# Patient Record
Sex: Female | Born: 1935
Health system: Southern US, Community
[De-identification: ages and names within clinical notes are randomized; demographics above are authoritative.]

## PROBLEM LIST (undated history)

## (undated) DIAGNOSIS — H269 Unspecified cataract: Secondary | ICD-10-CM

## (undated) DIAGNOSIS — I455 Other specified heart block: Secondary | ICD-10-CM

## (undated) DIAGNOSIS — E785 Hyperlipidemia, unspecified: Secondary | ICD-10-CM

## (undated) DIAGNOSIS — Z5189 Encounter for other specified aftercare: Secondary | ICD-10-CM

## (undated) DIAGNOSIS — K589 Irritable bowel syndrome without diarrhea: Secondary | ICD-10-CM

## (undated) DIAGNOSIS — K635 Polyp of colon: Secondary | ICD-10-CM

## (undated) DIAGNOSIS — K222 Esophageal obstruction: Secondary | ICD-10-CM

## (undated) DIAGNOSIS — K219 Gastro-esophageal reflux disease without esophagitis: Secondary | ICD-10-CM

## (undated) DIAGNOSIS — E039 Hypothyroidism, unspecified: Secondary | ICD-10-CM

## (undated) DIAGNOSIS — I480 Paroxysmal atrial fibrillation: Secondary | ICD-10-CM

## (undated) DIAGNOSIS — F419 Anxiety disorder, unspecified: Secondary | ICD-10-CM

## (undated) DIAGNOSIS — K529 Noninfective gastroenteritis and colitis, unspecified: Secondary | ICD-10-CM

## (undated) DIAGNOSIS — R55 Syncope and collapse: Secondary | ICD-10-CM

## (undated) DIAGNOSIS — I739 Peripheral vascular disease, unspecified: Secondary | ICD-10-CM

## (undated) DIAGNOSIS — I119 Hypertensive heart disease without heart failure: Secondary | ICD-10-CM

## (undated) DIAGNOSIS — D649 Anemia, unspecified: Secondary | ICD-10-CM

## (undated) DIAGNOSIS — I519 Heart disease, unspecified: Secondary | ICD-10-CM

## (undated) DIAGNOSIS — N184 Chronic kidney disease, stage 4 (severe): Secondary | ICD-10-CM

## (undated) DIAGNOSIS — M1990 Unspecified osteoarthritis, unspecified site: Secondary | ICD-10-CM

## (undated) DIAGNOSIS — Z950 Presence of cardiac pacemaker: Secondary | ICD-10-CM

## (undated) DIAGNOSIS — I447 Left bundle-branch block, unspecified: Secondary | ICD-10-CM

## (undated) DIAGNOSIS — R011 Cardiac murmur, unspecified: Secondary | ICD-10-CM

## (undated) DIAGNOSIS — K5790 Diverticulosis of intestine, part unspecified, without perforation or abscess without bleeding: Secondary | ICD-10-CM

## (undated) DIAGNOSIS — H8110 Benign paroxysmal vertigo, unspecified ear: Secondary | ICD-10-CM

## (undated) DIAGNOSIS — T7840XA Allergy, unspecified, initial encounter: Secondary | ICD-10-CM

## (undated) HISTORY — DX: Gastro-esophageal reflux disease without esophagitis: K21.9

## (undated) HISTORY — DX: Presence of cardiac pacemaker: Z95.0

## (undated) HISTORY — PX: TRANSTHORACIC ECHOCARDIOGRAM: SHX275

## (undated) HISTORY — DX: Hypothyroidism, unspecified: E03.9

## (undated) HISTORY — DX: Unspecified osteoarthritis, unspecified site: M19.90

## (undated) HISTORY — DX: Anemia, unspecified: D64.9

## (undated) HISTORY — DX: Cardiac murmur, unspecified: R01.1

## (undated) HISTORY — PX: OTHER SURGICAL HISTORY: SHX169

## (undated) HISTORY — DX: Irritable bowel syndrome, unspecified: K58.9

## (undated) HISTORY — DX: Unspecified cataract: H26.9

## (undated) HISTORY — DX: Allergy, unspecified, initial encounter: T78.40XA

## (undated) HISTORY — PX: CATARACT EXTRACTION: SUR2

## (undated) HISTORY — DX: Diverticulosis of intestine, part unspecified, without perforation or abscess without bleeding: K57.90

## (undated) HISTORY — DX: Polyp of colon: K63.5

## (undated) HISTORY — DX: Anxiety disorder, unspecified: F41.9

## (undated) HISTORY — DX: Hyperlipidemia, unspecified: E78.5

## (undated) HISTORY — DX: Chronic kidney disease, stage 4 (severe): N18.4

## (undated) HISTORY — DX: Encounter for other specified aftercare: Z51.89

## (undated) HISTORY — PX: APPENDECTOMY: SHX54

## (undated) HISTORY — DX: Esophageal obstruction: K22.2

## (undated) HISTORY — DX: Heart disease, unspecified: I51.9

---

## 1960-11-11 HISTORY — PX: OOPHORECTOMY: SHX86

## 1998-02-20 ENCOUNTER — Ambulatory Visit (HOSPITAL_COMMUNITY): Admission: RE | Admit: 1998-02-20 | Discharge: 1998-02-20 | Payer: Self-pay | Admitting: Hematology and Oncology

## 1998-11-11 HISTORY — PX: LUMBAR DISC SURGERY: SHX700

## 1999-06-11 ENCOUNTER — Encounter: Payer: Self-pay | Admitting: Orthopedic Surgery

## 1999-06-12 ENCOUNTER — Encounter (INDEPENDENT_AMBULATORY_CARE_PROVIDER_SITE_OTHER): Payer: Self-pay | Admitting: Specialist

## 1999-06-12 ENCOUNTER — Inpatient Hospital Stay (HOSPITAL_COMMUNITY): Admission: RE | Admit: 1999-06-12 | Discharge: 1999-06-14 | Payer: Self-pay | Admitting: Orthopedic Surgery

## 1999-06-12 ENCOUNTER — Encounter: Payer: Self-pay | Admitting: Orthopedic Surgery

## 2001-04-14 ENCOUNTER — Ambulatory Visit (HOSPITAL_COMMUNITY): Admission: RE | Admit: 2001-04-14 | Discharge: 2001-04-14 | Payer: Self-pay | Admitting: Family Medicine

## 2001-04-14 ENCOUNTER — Encounter: Payer: Self-pay | Admitting: Family Medicine

## 2001-07-09 ENCOUNTER — Encounter (INDEPENDENT_AMBULATORY_CARE_PROVIDER_SITE_OTHER): Payer: Self-pay | Admitting: Gastroenterology

## 2001-07-10 ENCOUNTER — Encounter (INDEPENDENT_AMBULATORY_CARE_PROVIDER_SITE_OTHER): Payer: Self-pay

## 2001-07-10 ENCOUNTER — Other Ambulatory Visit: Admission: RE | Admit: 2001-07-10 | Discharge: 2001-07-10 | Payer: Self-pay | Admitting: Gastroenterology

## 2003-09-08 ENCOUNTER — Encounter (INDEPENDENT_AMBULATORY_CARE_PROVIDER_SITE_OTHER): Payer: Self-pay | Admitting: Gastroenterology

## 2004-10-10 ENCOUNTER — Ambulatory Visit: Payer: Self-pay | Admitting: Cardiology

## 2004-10-15 ENCOUNTER — Ambulatory Visit: Payer: Self-pay

## 2004-11-19 ENCOUNTER — Ambulatory Visit: Payer: Self-pay | Admitting: Hematology & Oncology

## 2005-03-20 ENCOUNTER — Ambulatory Visit: Payer: Self-pay | Admitting: Hematology & Oncology

## 2005-03-22 ENCOUNTER — Ambulatory Visit: Payer: Self-pay | Admitting: Family Medicine

## 2005-06-11 ENCOUNTER — Ambulatory Visit: Payer: Self-pay | Admitting: Cardiology

## 2005-08-01 ENCOUNTER — Ambulatory Visit: Payer: Self-pay | Admitting: Family Medicine

## 2005-09-17 ENCOUNTER — Ambulatory Visit: Payer: Self-pay | Admitting: Hematology & Oncology

## 2005-09-19 ENCOUNTER — Ambulatory Visit: Payer: Self-pay | Admitting: Family Medicine

## 2005-11-20 ENCOUNTER — Ambulatory Visit: Payer: Self-pay | Admitting: Family Medicine

## 2006-01-21 ENCOUNTER — Ambulatory Visit: Payer: Self-pay | Admitting: Family Medicine

## 2006-02-03 ENCOUNTER — Ambulatory Visit: Payer: Self-pay | Admitting: Gastroenterology

## 2006-02-18 ENCOUNTER — Ambulatory Visit: Payer: Self-pay | Admitting: Gastroenterology

## 2006-02-18 ENCOUNTER — Encounter (INDEPENDENT_AMBULATORY_CARE_PROVIDER_SITE_OTHER): Payer: Self-pay | Admitting: *Deleted

## 2006-03-16 ENCOUNTER — Ambulatory Visit: Payer: Self-pay | Admitting: Hematology & Oncology

## 2006-03-19 LAB — CBC WITH DIFFERENTIAL/PLATELET
BASO%: 0.7 % (ref 0.0–2.0)
EOS%: 1.4 % (ref 0.0–7.0)
HCT: 33.5 % — ABNORMAL LOW (ref 34.8–46.6)
LYMPH%: 27.1 % (ref 14.0–48.0)
MCH: 31.3 pg (ref 26.0–34.0)
MCHC: 34 g/dL (ref 32.0–36.0)
MONO#: 0.5 10*3/uL (ref 0.1–0.9)
MONO%: 11 % (ref 0.0–13.0)
NEUT%: 59.8 % (ref 39.6–76.8)
Platelets: 215 10*3/uL (ref 145–400)
RBC: 3.64 10*6/uL — ABNORMAL LOW (ref 3.70–5.32)
WBC: 4.3 10*3/uL (ref 3.9–10.0)

## 2006-03-19 LAB — CHCC SMEAR

## 2006-08-11 ENCOUNTER — Ambulatory Visit: Payer: Self-pay | Admitting: Gastroenterology

## 2006-11-14 ENCOUNTER — Ambulatory Visit: Payer: Self-pay | Admitting: Hematology & Oncology

## 2006-12-23 ENCOUNTER — Encounter: Admission: RE | Admit: 2006-12-23 | Discharge: 2006-12-23 | Payer: Self-pay | Admitting: Orthopedic Surgery

## 2007-01-12 ENCOUNTER — Ambulatory Visit: Payer: Self-pay | Admitting: Family Medicine

## 2007-01-12 LAB — CONVERTED CEMR LAB
ALT: 14 units/L (ref 0–40)
AST: 20 units/L (ref 0–37)
Albumin: 3.7 g/dL (ref 3.5–5.2)
Alkaline Phosphatase: 135 units/L — ABNORMAL HIGH (ref 39–117)
BUN: 34 mg/dL — ABNORMAL HIGH (ref 6–23)
Basophils Absolute: 0 10*3/uL (ref 0.0–0.1)
Basophils Relative: 0.9 % (ref 0.0–1.0)
Bilirubin, Direct: 0.1 mg/dL (ref 0.0–0.3)
CO2: 29 meq/L (ref 19–32)
Calcium: 9 mg/dL (ref 8.4–10.5)
Chloride: 105 meq/L (ref 96–112)
Cholesterol: 222 mg/dL (ref 0–200)
Creatinine, Ser: 1.4 mg/dL — ABNORMAL HIGH (ref 0.4–1.2)
Direct LDL: 155.2 mg/dL
Eosinophils Absolute: 0.1 10*3/uL (ref 0.0–0.6)
Eosinophils Relative: 2.7 % (ref 0.0–5.0)
GFR calc Af Amer: 47 mL/min
GFR calc non Af Amer: 38 mL/min
Glucose, Bld: 102 mg/dL — ABNORMAL HIGH (ref 70–99)
HCT: 36.7 % (ref 36.0–46.0)
HDL: 51.2 mg/dL (ref >39.0)
Hemoglobin: 12.3 g/dL (ref 12.0–15.0)
Lymphocytes Relative: 22.4 % (ref 12.0–46.0)
MCHC: 33.6 g/dL (ref 30.0–36.0)
MCV: 91.9 fL (ref 78.0–100.0)
Monocytes Absolute: 0.5 10*3/uL (ref 0.2–0.7)
Monocytes Relative: 9.6 % (ref 3.0–11.0)
Neutro Abs: 3.3 10*3/uL (ref 1.4–7.7)
Neutrophils Relative %: 64.4 % (ref 43.0–77.0)
Platelets: 243 10*3/uL (ref 150–400)
Potassium: 4.3 meq/L (ref 3.5–5.1)
RBC: 3.99 M/uL (ref 3.87–5.11)
RDW: 12.4 % (ref 11.5–14.6)
Sodium: 141 meq/L (ref 135–145)
TSH: 1.39 microintl units/mL (ref 0.35–5.50)
Total Bilirubin: 0.7 mg/dL (ref 0.3–1.2)
Total CHOL/HDL Ratio: 4.3
Total Protein: 7 g/dL (ref 6.0–8.3)
Triglycerides: 80 mg/dL (ref 0–149)
VLDL: 16 mg/dL (ref 0–40)
WBC: 5 10*3/uL (ref 4.5–10.5)

## 2007-03-04 ENCOUNTER — Ambulatory Visit: Payer: Self-pay | Admitting: Gastroenterology

## 2007-03-10 ENCOUNTER — Ambulatory Visit: Payer: Self-pay | Admitting: Gastroenterology

## 2007-04-01 ENCOUNTER — Encounter: Payer: Self-pay | Admitting: Family Medicine

## 2007-05-05 ENCOUNTER — Ambulatory Visit: Payer: Self-pay | Admitting: Cardiology

## 2007-05-12 HISTORY — PX: LOW ANTERIOR BOWEL RESECTION: SUR1240

## 2007-05-13 ENCOUNTER — Ambulatory Visit: Payer: Self-pay | Admitting: Cardiology

## 2007-05-19 ENCOUNTER — Encounter (INDEPENDENT_AMBULATORY_CARE_PROVIDER_SITE_OTHER): Payer: Self-pay | Admitting: General Surgery

## 2007-05-19 ENCOUNTER — Encounter (INDEPENDENT_AMBULATORY_CARE_PROVIDER_SITE_OTHER): Payer: Self-pay | Admitting: *Deleted

## 2007-05-19 ENCOUNTER — Inpatient Hospital Stay (HOSPITAL_COMMUNITY): Admission: RE | Admit: 2007-05-19 | Discharge: 2007-05-25 | Payer: Self-pay | Admitting: General Surgery

## 2007-07-23 DIAGNOSIS — I119 Hypertensive heart disease without heart failure: Secondary | ICD-10-CM | POA: Insufficient documentation

## 2007-07-23 DIAGNOSIS — E039 Hypothyroidism, unspecified: Secondary | ICD-10-CM | POA: Insufficient documentation

## 2007-07-23 DIAGNOSIS — E785 Hyperlipidemia, unspecified: Secondary | ICD-10-CM | POA: Insufficient documentation

## 2007-07-27 ENCOUNTER — Telehealth: Payer: Self-pay | Admitting: Family Medicine

## 2007-12-03 ENCOUNTER — Ambulatory Visit: Payer: Self-pay | Admitting: Gastroenterology

## 2008-01-13 ENCOUNTER — Ambulatory Visit: Payer: Self-pay | Admitting: Family Medicine

## 2008-01-13 DIAGNOSIS — M1990 Unspecified osteoarthritis, unspecified site: Secondary | ICD-10-CM | POA: Insufficient documentation

## 2008-01-13 DIAGNOSIS — Z860100 Personal history of colon polyps, unspecified: Secondary | ICD-10-CM | POA: Insufficient documentation

## 2008-01-13 DIAGNOSIS — D649 Anemia, unspecified: Secondary | ICD-10-CM | POA: Insufficient documentation

## 2008-01-13 DIAGNOSIS — J45909 Unspecified asthma, uncomplicated: Secondary | ICD-10-CM | POA: Insufficient documentation

## 2008-01-15 LAB — CONVERTED CEMR LAB
Basophils Absolute: 0 10*3/uL (ref 0.0–0.1)
Bilirubin, Direct: 0.1 mg/dL (ref 0.0–0.3)
Eosinophils Absolute: 0.1 10*3/uL (ref 0.0–0.6)
Eosinophils Relative: 1.3 % (ref 0.0–5.0)
GFR calc Af Amer: 37 mL/min
GFR calc non Af Amer: 31 mL/min
Glucose, Bld: 94 mg/dL (ref 70–99)
HCT: 34.4 % — ABNORMAL LOW (ref 36.0–46.0)
Lymphocytes Relative: 28.2 % (ref 12.0–46.0)
MCHC: 33.3 g/dL (ref 30.0–36.0)
MCV: 91.7 fL (ref 78.0–100.0)
Neutro Abs: 2.3 10*3/uL (ref 1.4–7.7)
Neutrophils Relative %: 56.2 % (ref 43.0–77.0)
Potassium: 5.8 meq/L — ABNORMAL HIGH (ref 3.5–5.1)
Sodium: 142 meq/L (ref 135–145)
TSH: 0.91 microintl units/mL (ref 0.35–5.50)
WBC: 4.2 10*3/uL — ABNORMAL LOW (ref 4.5–10.5)

## 2008-02-01 ENCOUNTER — Encounter: Payer: Self-pay | Admitting: Family Medicine

## 2008-02-22 ENCOUNTER — Ambulatory Visit: Payer: Self-pay | Admitting: Internal Medicine

## 2008-02-24 ENCOUNTER — Ambulatory Visit: Payer: Self-pay | Admitting: Cardiology

## 2008-02-25 ENCOUNTER — Telehealth: Payer: Self-pay | Admitting: Family Medicine

## 2008-03-11 ENCOUNTER — Encounter: Payer: Self-pay | Admitting: Family Medicine

## 2008-03-17 ENCOUNTER — Encounter: Payer: Self-pay | Admitting: Family Medicine

## 2008-03-28 ENCOUNTER — Ambulatory Visit: Payer: Self-pay | Admitting: Cardiology

## 2008-04-07 ENCOUNTER — Encounter: Payer: Self-pay | Admitting: Family Medicine

## 2008-04-07 ENCOUNTER — Ambulatory Visit: Payer: Self-pay

## 2008-05-17 ENCOUNTER — Ambulatory Visit: Payer: Self-pay | Admitting: Family Medicine

## 2008-05-22 ENCOUNTER — Emergency Department (HOSPITAL_COMMUNITY): Admission: EM | Admit: 2008-05-22 | Discharge: 2008-05-23 | Payer: Self-pay | Admitting: Emergency Medicine

## 2008-06-28 ENCOUNTER — Ambulatory Visit: Payer: Self-pay | Admitting: Cardiology

## 2008-07-27 ENCOUNTER — Telehealth: Payer: Self-pay | Admitting: Family Medicine

## 2008-08-02 ENCOUNTER — Telehealth: Payer: Self-pay | Admitting: Family Medicine

## 2008-09-01 ENCOUNTER — Ambulatory Visit: Payer: Self-pay | Admitting: Family Medicine

## 2008-09-19 ENCOUNTER — Telehealth: Payer: Self-pay | Admitting: Family Medicine

## 2008-09-26 ENCOUNTER — Ambulatory Visit: Payer: Self-pay | Admitting: Family Medicine

## 2008-09-28 LAB — CONVERTED CEMR LAB
Albumin: 3.8 g/dL (ref 3.5–5.2)
BUN: 34 mg/dL — ABNORMAL HIGH (ref 6–23)
Basophils Relative: 1.3 % (ref 0.0–3.0)
Calcium: 9.1 mg/dL (ref 8.4–10.5)
Creatinine, Ser: 1.3 mg/dL — ABNORMAL HIGH (ref 0.4–1.2)
Eosinophils Absolute: 0.1 10*3/uL (ref 0.0–0.7)
Eosinophils Relative: 1.6 % (ref 0.0–5.0)
GFR calc Af Amer: 50 mL/min
GFR calc non Af Amer: 42 mL/min
HCT: 30.2 % — ABNORMAL LOW (ref 36.0–46.0)
HDL: 44.4 mg/dL (ref >39.0)
MCV: 93.5 fL (ref 78.0–100.0)
Monocytes Absolute: 0.4 10*3/uL (ref 0.1–1.0)
Platelets: 177 10*3/uL (ref 150–400)
TSH: 2.44 microintl units/mL (ref 0.35–5.50)
WBC: 4 10*3/uL — ABNORMAL LOW (ref 4.5–10.5)

## 2008-10-03 ENCOUNTER — Telehealth: Payer: Self-pay | Admitting: Family Medicine

## 2008-12-07 ENCOUNTER — Telehealth: Payer: Self-pay | Admitting: Family Medicine

## 2008-12-26 ENCOUNTER — Ambulatory Visit: Payer: Self-pay | Admitting: Cardiology

## 2008-12-28 ENCOUNTER — Encounter (INDEPENDENT_AMBULATORY_CARE_PROVIDER_SITE_OTHER): Payer: Self-pay | Admitting: *Deleted

## 2008-12-29 ENCOUNTER — Ambulatory Visit: Payer: Self-pay | Admitting: Family Medicine

## 2008-12-29 ENCOUNTER — Encounter: Payer: Self-pay | Admitting: Cardiology

## 2008-12-29 DIAGNOSIS — F411 Generalized anxiety disorder: Secondary | ICD-10-CM | POA: Insufficient documentation

## 2009-01-27 ENCOUNTER — Telehealth: Payer: Self-pay | Admitting: Family Medicine

## 2009-02-01 ENCOUNTER — Ambulatory Visit: Payer: Self-pay | Admitting: Gastroenterology

## 2009-02-06 ENCOUNTER — Telehealth: Payer: Self-pay | Admitting: Gastroenterology

## 2009-02-09 ENCOUNTER — Telehealth: Payer: Self-pay | Admitting: Family Medicine

## 2009-02-16 ENCOUNTER — Encounter: Payer: Self-pay | Admitting: Cardiology

## 2009-02-16 ENCOUNTER — Ambulatory Visit: Payer: Self-pay | Admitting: Cardiology

## 2009-02-20 ENCOUNTER — Encounter: Payer: Self-pay | Admitting: Gastroenterology

## 2009-02-20 ENCOUNTER — Telehealth: Payer: Self-pay | Admitting: Cardiology

## 2009-02-20 ENCOUNTER — Ambulatory Visit: Payer: Self-pay | Admitting: Gastroenterology

## 2009-02-21 ENCOUNTER — Encounter: Payer: Self-pay | Admitting: Gastroenterology

## 2009-02-28 ENCOUNTER — Ambulatory Visit: Payer: Self-pay

## 2009-03-06 ENCOUNTER — Ambulatory Visit: Payer: Self-pay | Admitting: Family Medicine

## 2009-03-06 DIAGNOSIS — R609 Edema, unspecified: Secondary | ICD-10-CM | POA: Insufficient documentation

## 2009-03-22 ENCOUNTER — Ambulatory Visit: Payer: Self-pay | Admitting: Gastroenterology

## 2009-03-22 DIAGNOSIS — K589 Irritable bowel syndrome without diarrhea: Secondary | ICD-10-CM | POA: Insufficient documentation

## 2009-04-05 ENCOUNTER — Telehealth: Payer: Self-pay | Admitting: Family Medicine

## 2009-04-17 ENCOUNTER — Telehealth: Payer: Self-pay | Admitting: Cardiology

## 2009-04-28 ENCOUNTER — Encounter (INDEPENDENT_AMBULATORY_CARE_PROVIDER_SITE_OTHER): Payer: Self-pay | Admitting: *Deleted

## 2009-05-16 ENCOUNTER — Encounter: Payer: Self-pay | Admitting: Family Medicine

## 2009-05-22 ENCOUNTER — Ambulatory Visit: Payer: Self-pay | Admitting: Cardiology

## 2009-05-30 ENCOUNTER — Telehealth: Payer: Self-pay | Admitting: Cardiology

## 2009-05-31 ENCOUNTER — Ambulatory Visit: Payer: Self-pay | Admitting: Cardiology

## 2009-06-02 LAB — CONVERTED CEMR LAB
CO2: 28 meq/L (ref 19–32)
GFR calc non Af Amer: 41.61 mL/min (ref >60)
Glucose, Bld: 90 mg/dL (ref 70–99)
Potassium: 4.5 meq/L (ref 3.5–5.1)
Sodium: 142 meq/L (ref 135–145)

## 2009-06-16 ENCOUNTER — Telehealth: Payer: Self-pay | Admitting: Cardiology

## 2009-07-03 ENCOUNTER — Ambulatory Visit: Payer: Self-pay | Admitting: Cardiology

## 2009-07-04 ENCOUNTER — Telehealth (INDEPENDENT_AMBULATORY_CARE_PROVIDER_SITE_OTHER): Payer: Self-pay | Admitting: *Deleted

## 2009-07-05 LAB — CONVERTED CEMR LAB
BUN: 29 mg/dL — ABNORMAL HIGH (ref 6–23)
CO2: 27 meq/L (ref 19–32)
Chloride: 110 meq/L (ref 96–112)
Creatinine, Ser: 1.5 mg/dL — ABNORMAL HIGH (ref 0.4–1.2)
Potassium: 4.2 meq/L (ref 3.5–5.1)

## 2009-07-13 ENCOUNTER — Ambulatory Visit: Payer: Self-pay | Admitting: Cardiology

## 2009-08-01 ENCOUNTER — Telehealth: Payer: Self-pay | Admitting: Family Medicine

## 2009-08-30 ENCOUNTER — Ambulatory Visit: Payer: Self-pay | Admitting: Family Medicine

## 2009-10-17 ENCOUNTER — Ambulatory Visit: Payer: Self-pay | Admitting: Cardiology

## 2009-10-17 DIAGNOSIS — R0989 Other specified symptoms and signs involving the circulatory and respiratory systems: Secondary | ICD-10-CM | POA: Insufficient documentation

## 2009-10-18 ENCOUNTER — Encounter: Payer: Self-pay | Admitting: Cardiology

## 2009-10-18 ENCOUNTER — Ambulatory Visit: Payer: Self-pay

## 2009-10-24 ENCOUNTER — Telehealth: Payer: Self-pay | Admitting: Cardiology

## 2009-10-25 ENCOUNTER — Ambulatory Visit: Payer: Self-pay | Admitting: Cardiology

## 2009-11-01 ENCOUNTER — Ambulatory Visit: Payer: Self-pay | Admitting: Cardiology

## 2009-11-01 ENCOUNTER — Telehealth: Payer: Self-pay | Admitting: Cardiology

## 2009-11-01 LAB — CONVERTED CEMR LAB
BUN: 33 mg/dL — ABNORMAL HIGH (ref 6–23)
CO2: 24 meq/L (ref 19–32)
GFR calc non Af Amer: 41.56 mL/min (ref >60)
Glucose, Bld: 93 mg/dL (ref 70–99)
Potassium: 5.3 meq/L — ABNORMAL HIGH (ref 3.5–5.1)

## 2009-11-02 LAB — CONVERTED CEMR LAB
BUN: 40 mg/dL — ABNORMAL HIGH (ref 6–23)
CO2: 23 meq/L (ref 19–32)
Calcium: 8.9 mg/dL (ref 8.4–10.5)
Creatinine, Ser: 1.7 mg/dL — ABNORMAL HIGH (ref 0.4–1.2)

## 2009-11-14 ENCOUNTER — Ambulatory Visit: Payer: Self-pay | Admitting: Cardiology

## 2009-11-17 LAB — CONVERTED CEMR LAB
BUN: 38 mg/dL — ABNORMAL HIGH (ref 6–23)
Creatinine, Ser: 1.6 mg/dL — ABNORMAL HIGH (ref 0.4–1.2)
Potassium: 5 meq/L (ref 3.5–5.1)

## 2009-12-04 ENCOUNTER — Ambulatory Visit: Payer: Self-pay | Admitting: Cardiology

## 2009-12-18 ENCOUNTER — Telehealth: Payer: Self-pay | Admitting: Cardiology

## 2009-12-18 ENCOUNTER — Ambulatory Visit: Payer: Self-pay | Admitting: Cardiovascular Disease

## 2009-12-19 ENCOUNTER — Telehealth: Payer: Self-pay | Admitting: Cardiology

## 2009-12-26 ENCOUNTER — Telehealth: Payer: Self-pay | Admitting: Cardiology

## 2009-12-28 ENCOUNTER — Telehealth (INDEPENDENT_AMBULATORY_CARE_PROVIDER_SITE_OTHER): Payer: Self-pay | Admitting: *Deleted

## 2009-12-28 ENCOUNTER — Ambulatory Visit: Payer: Self-pay | Admitting: Cardiovascular Disease

## 2009-12-29 ENCOUNTER — Telehealth: Payer: Self-pay | Admitting: Family Medicine

## 2010-01-02 LAB — CONVERTED CEMR LAB
BUN: 42 mg/dL — ABNORMAL HIGH (ref 6–23)
Chloride: 110 meq/L (ref 96–112)
Glucose, Bld: 79 mg/dL (ref 70–99)
Potassium: 5.5 meq/L — ABNORMAL HIGH (ref 3.5–5.1)

## 2010-01-09 ENCOUNTER — Ambulatory Visit: Payer: Self-pay | Admitting: Cardiology

## 2010-01-10 ENCOUNTER — Telehealth: Payer: Self-pay | Admitting: Gastroenterology

## 2010-01-16 ENCOUNTER — Ambulatory Visit: Payer: Self-pay | Admitting: Family Medicine

## 2010-01-18 ENCOUNTER — Telehealth: Payer: Self-pay | Admitting: Cardiology

## 2010-01-18 LAB — CONVERTED CEMR LAB
ALT: 16 units/L (ref 0–35)
AST: 26 units/L (ref 0–37)
BUN: 31 mg/dL — ABNORMAL HIGH (ref 6–23)
Eosinophils Relative: 2.4 % (ref 0.0–5.0)
GFR calc non Af Amer: 38.14 mL/min (ref >60)
HCT: 32.3 % — ABNORMAL LOW (ref 36.0–46.0)
Hemoglobin: 10.6 g/dL — ABNORMAL LOW (ref 12.0–15.0)
LDL Cholesterol: 120 mg/dL — ABNORMAL HIGH (ref 0–99)
Lymphs Abs: 1 10*3/uL (ref 0.7–4.0)
Monocytes Relative: 10.2 % (ref 3.0–12.0)
Platelets: 164 10*3/uL (ref 150.0–400.0)
Potassium: 5.3 meq/L — ABNORMAL HIGH (ref 3.5–5.1)
RBC: 3.39 M/uL — ABNORMAL LOW (ref 3.87–5.11)
Sodium: 143 meq/L (ref 135–145)
TSH: 0.77 microintl units/mL (ref 0.35–5.50)
Total Bilirubin: 0.4 mg/dL (ref 0.3–1.2)
Total CHOL/HDL Ratio: 3
VLDL: 16 mg/dL (ref 0.0–40.0)
WBC: 3 10*3/uL — ABNORMAL LOW (ref 4.5–10.5)

## 2010-01-29 ENCOUNTER — Ambulatory Visit: Payer: Self-pay | Admitting: Cardiology

## 2010-02-01 ENCOUNTER — Telehealth: Payer: Self-pay | Admitting: Family Medicine

## 2010-02-07 ENCOUNTER — Ambulatory Visit: Payer: Self-pay | Admitting: Cardiology

## 2010-02-07 ENCOUNTER — Encounter (INDEPENDENT_AMBULATORY_CARE_PROVIDER_SITE_OTHER): Payer: Self-pay | Admitting: *Deleted

## 2010-02-12 LAB — CONVERTED CEMR LAB: Metaneph Total, Ur: 501 ug/24hr (ref 224–832)

## 2010-02-28 ENCOUNTER — Encounter: Payer: Self-pay | Admitting: Cardiology

## 2010-03-01 ENCOUNTER — Ambulatory Visit: Payer: Self-pay

## 2010-03-01 ENCOUNTER — Encounter: Payer: Self-pay | Admitting: Cardiology

## 2010-03-08 ENCOUNTER — Ambulatory Visit: Payer: Self-pay | Admitting: Cardiology

## 2010-03-14 ENCOUNTER — Telehealth: Payer: Self-pay | Admitting: Family Medicine

## 2010-03-30 ENCOUNTER — Encounter (INDEPENDENT_AMBULATORY_CARE_PROVIDER_SITE_OTHER): Payer: Self-pay | Admitting: *Deleted

## 2010-04-03 ENCOUNTER — Telehealth (INDEPENDENT_AMBULATORY_CARE_PROVIDER_SITE_OTHER): Payer: Self-pay | Admitting: *Deleted

## 2010-04-23 ENCOUNTER — Encounter: Payer: Self-pay | Admitting: Family Medicine

## 2010-05-28 ENCOUNTER — Encounter: Payer: Self-pay | Admitting: Family Medicine

## 2010-05-29 ENCOUNTER — Encounter: Payer: Self-pay | Admitting: Family Medicine

## 2010-07-02 ENCOUNTER — Ambulatory Visit: Payer: Self-pay | Admitting: Gastroenterology

## 2010-07-04 ENCOUNTER — Ambulatory Visit: Payer: Self-pay | Admitting: Family Medicine

## 2010-07-04 DIAGNOSIS — H8110 Benign paroxysmal vertigo, unspecified ear: Secondary | ICD-10-CM | POA: Insufficient documentation

## 2010-08-13 ENCOUNTER — Encounter: Payer: Self-pay | Admitting: Family Medicine

## 2010-09-06 ENCOUNTER — Ambulatory Visit: Payer: Self-pay | Admitting: Family Medicine

## 2010-10-26 ENCOUNTER — Encounter: Payer: Self-pay | Admitting: Family Medicine

## 2010-12-09 LAB — CONVERTED CEMR LAB
BUN: 27 mg/dL — ABNORMAL HIGH (ref 6–23)
Bilirubin Urine: NEGATIVE
Chloride: 109 meq/L (ref 96–112)
GFR calc Af Amer: 51 mL/min
GFR calc non Af Amer: 42 mL/min
Glucose, Urine, Semiquant: NEGATIVE
Nitrite: NEGATIVE
Potassium: 4.4 meq/L (ref 3.5–5.1)
Potassium: 4.5 meq/L (ref 3.5–5.1)
Sodium: 140 meq/L (ref 135–145)
Specific Gravity, Urine: 1.02
pH: 5

## 2010-12-11 NOTE — Miscellaneous (Signed)
Summary: Orders Update  Clinical Lists Changes  Orders: Added new Test order of T-Urine 24hr. [facility]MA/[facility]VA ([id number]) - Signed Added new Test order of T-Urine 24 Hr. Metanephrines [phone]) - Signed

## 2010-12-11 NOTE — Letter (Signed)
Summary: Office Visit Letter  [facility]  [address] [place], [place] [address]   Phone: [phone]  Fax: [phone]      Mar 30, 2010 MRN: [id number]   [place], [place]  [address]   Dear Ms. Reade,   According to our records, it is time for you to schedule a follow-up office visit with [place].   At your convenience, please call [phone] (option #2)to schedule an office visit. If you have any questions, concerns, or feel that this letter is in error, we would appreciate your call.   Sincerely,  Norberto Sorenson T. Fuller Plan, M.D.  [facility] [phone]

## 2010-12-11 NOTE — Assessment & Plan Note (Signed)
Summary: BP Issues   Visit Type:  Follow-up Primary Provider:  [name], MD  CC:  BP issues- High Blood Pressure.  History of Present Illness: 77 year-old woman with malignant HTN, pt of Dr Percival Spanish, presenting for evaluation of worsening HTN. At recent visit her clonidine was increased to 0.2 mg two times a day but she has not tolerated this well because of dry mouth and fatigue. Home BP's have been around A999333 systolic. She has had an intermittent dull headache, but denies chest pain, dyspnea, edema, or other complaints. No blurry vision.  Current Medications (verified): 1)  Aspirin Ec Lo-Dose 81 Mg Tbec (Aspirin) .... Take 1 Tablet By Mouth Every Morning 2)  Lisinopril-Hydrochlorothiazide 20-12.5 Mg Tabs (Lisinopril-Hydrochlorothiazide) .... 2 Po   By Mouth Once Daily 3)  Synthroid 88 Mcg Tabs (Levothyroxine Sodium) .Marland Kitchen.. 1 By Mouth Once Daily 4)  Temazepam 30 Mg  Caps (Temazepam) .... At Bedtime As Needed 5)  Diltiazem Hcl Coated Beads 120 Mg Xr24h-Cap (Diltiazem Hcl Coated Beads) .... One By Mouth Once Daily 6)  Folic Acid 1 Mg Tabs (Folic Acid) .... Daily 7)  Gas-X 80 Mg Chew (Simethicone) .... Daily 8)  Align  Caps (Probiotic Product) .... As Needed 9)  Clonidine Hcl 0.2 Mg Tabs (Clonidine Hcl) .... One Twice A Day  Allergies: 1)  ! Penicillin V Potassium (Penicillin V Potassium) 2)  Amoxicillin (Amoxicillin) 3)  Codeine Phosphate (Codeine Phosphate)  Past History:  Past medical history reviewed for relevance to current acute and chronic problems.  Past Medical History: Reviewed history from 02/15/2009 and no changes required. Hyperlipidemia Hypertension, sees Dr. Percival Spanish Hypothyroidism Osteoarthritis Anemia-NOS Asthma Renal insufficiency, sees Dr. [name] Anxiety Irritable Bowel Syndrome Gout Adenomatous Colon Polyps 06/2001 Tubulovillous Adenoma 02/2006 Lupus Hemorrhoids Obesity Diverticulosis Carotid Bruits (non obstructive plaque)  Review of Systems    Negative except as per HPI   Vital Signs:  Patient profile:   77 year old female Menstrual status:  postmenopausal Pulse rate:   64 / minute Resp:     16 per minute BP sitting:   220 / 76  (left arm) Cuff size:   large  Vitals Entered By: Sidney Ace (December 18, 2009 1:48 PM)  Physical Exam  General:  Pt is alert and oriented, elderly woman, in no acute distress. HEENT: normal Neck: normal carotid upstrokes without bruits, JVP normal Lungs: CTA CV: RRR without murmur, S4 gallop present Abd: soft, NT, positive BS, no bruit, no organomegaly Ext: no clubbing, cyanosis, or edema. peripheral pulses 2+ and equal Skin: warm and dry without rash    Impression & Recommendations:  Problem # 1:  HYPERTENSION (ICD-401.9) Recommend decrease clonidine to 0.1 mg two times a day as she tolerates this at lower doses. I reviewed her past history of antihypertensive meds and Dr Hochrein's previous office notes. She has tolerated aldactone at the 25 mg dose (had hyperkalemia at 50 mg). Will give her another trial of aldactone 25 mg and also start hydralazine 25 mg three times a day. Continue bp log and f/u with Dr Percival Spanish as scheduled - also check a BMET at the time of her return visit.  Her updated medication list for this problem includes:    Aspirin Ec Lo-dose 81 Mg Tbec (Aspirin) .Marland Kitchen... Take 1 tablet by mouth every morning    Lisinopril-hydrochlorothiazide 20-12.5 Mg Tabs (Lisinopril-hydrochlorothiazide) .Marland Kitchen... 2 po   by mouth once daily    Diltiazem Hcl Coated Beads 120 Mg Xr24h-cap (Diltiazem hcl coated beads) ..... One  by mouth once daily    Clonidine Hcl 0.1 Mg Tabs (Clonidine hcl) .Marland Kitchen... Take one tablet by mouth twice a day    Aldactone 25 Mg Tabs (Spironolactone) .Marland Kitchen... Take one tablet by mouth daily    Hydralazine Hcl 25 Mg Tabs (Hydralazine hcl) .Marland Kitchen... Take one tablet by mouth three times a day  Patient Instructions: 1)  Your physician has recommended you make the following change  in your medication: DECREASE Clonidine to 0.1mg  one tablet by mouth twice a day, START Aldactone 25mg  one tablet daily, START Hydralazine 25mg  take one tablet by mouth three times a day 2)  Your physician recommends that you return for lab work in: 10 DAYS (BMP 402.01, v58.69) 3)  Please keep your scheduled follow-up appointment with Dr Percival Spanish.  Prescriptions: HYDRALAZINE HCL 25 MG TABS (HYDRALAZINE HCL) Take one tablet by mouth three times a day  #90 x 6   Entered by:   Theodosia Quay, RN, BSN   Authorized by:   Neale Burly, MD   Signed by:   Theodosia Quay, RN, BSN on 12/18/2009   Method used:   Electronically to        [facility] (retail)       [address]       [place], [place]  [address]       Ph: [phone] or [phone]       Fax: [phone]   RxIDWE:[id number] ALDACTONE 25 MG TABS (SPIRONOLACTONE) take one tablet by mouth daily  #30 x 6   Entered by:   Theodosia Quay, RN, BSN   Authorized by:   Neale Burly, MD   Signed by:   Theodosia Quay, RN, BSN on 12/18/2009   Method used:   Electronically to        [facility] (retail)       [address]       [place], [place]   [address]       Ph: [phone] or [phone]       Fax: [phone]   RxIDPI:[id number] CLONIDINE HCL 0.1 MG TABS (CLONIDINE HCL) take one tablet by mouth twice a day  #60 x 6   Entered by:   Theodosia Quay, RN, BSN   Authorized by:   Neale Burly, MD   Signed by:   Theodosia Quay, RN, BSN on 12/18/2009   Method used:   Electronically to        [facility] (retail)       [address]       [place], [place]  [address]       Ph: [phone] or [phone]       Fax: [phone]   RxID:   [id number]

## 2010-12-11 NOTE — Progress Notes (Signed)
Summary: high blood pressure  Phone Note Call from Patient Call back at [facility] Phone [phone] Call back at [phone] (cell)   Caller: Patient Call For: Dr. Fuller Plan Reason for Call: Talk to Nurse Summary of Call: pt reporting very high blood pressure... pt says she watched something on TV last night that said that her high blood pressure could be caused by bacteria in her GI tract Initial call taken by: Lucien Mons,  January 10, 2010 10:08 AM  Follow-up for Phone Call        Advised patient that I was unaware of any studies with HTN and gut bacteria.  She saw a infomertial about the correlation.  I have asked her to call her cardiologist and maybe he would be more familular or could consider a link if he thought it possible.   Follow-up by: Barb Merino RN, CGRN,  January 10, 2010 10:20 AM

## 2010-12-11 NOTE — Progress Notes (Signed)
Summary: refill temazepam  Phone Note From Pharmacy   Caller: [facility] Call For: Sarajane Jews  Summary of Call: refill temazepam 30mg  1 by mouth at bedtime Initial call taken by: Townsend Roger, [place],  December 29, 2009 4:52 PM  Follow-up for Phone Call        call in #30 with 5 rf Follow-up by: [name] MD,  December 29, 2009 5:18 PM  Additional Follow-up for Phone Call Additional follow up Details #1::        Phone call completed, Pharmacist called Additional Follow-up by: Townsend Roger, [place],  January 01, 2010 8:27 AM    Prescriptions: TEMAZEPAM 30 MG  CAPS (TEMAZEPAM) at bedtime as needed  #30 x 5   Entered by:   Townsend Roger, [place] by:   [name] MD   Signed by:   Townsend Roger, CMA on 01/01/2010   Method used:   Telephoned to ...       [facility] [address] (retail)       [place], [place]  [address]       Ph: [phone]       Fax: [phone]   RxID:   [id number]

## 2010-12-11 NOTE — Letter (Signed)
Summary: [facility]  [facility]   Imported By: Laural Benes 06/04/2010 12:23:50  _____________________________________________________________________  External Attachment:    Type:   Image     Comment:   External Document

## 2010-12-11 NOTE — Progress Notes (Signed)
Summary: med question  Phone Note Call from Patient   Caller: Patient Call For: [name] MD Summary of Call: Pt. bought Certizine, and wants to know if it is ok to take this with her other meds. [id number] Initial call taken by: Deanna Artis CMA,  Mar 14, 2010 9:57 AM  Follow-up for Phone Call        yes this is okay Follow-up by: [name] MD,  Mar 14, 2010 4:46 PM  Additional Follow-up for Phone Call Additional follow up Details #1::        Phone Call Completed Additional Follow-up by: Shelbie Hutching, RN,  Mar 14, 2010 5:04 PM

## 2010-12-11 NOTE — Letter (Signed)
Summary: [facility]  [facility]   Imported By: Laural Benes 05/02/2010 14:00:55  _____________________________________________________________________  External Attachment:    Type:   Image     Comment:   External Document

## 2010-12-11 NOTE — Progress Notes (Signed)
Summary: med questions  Phone Note Call from Patient Call back at [phone]   Caller: Son/[name] Reason for Call: Talk to Nurse Summary of Call: req call back... has some questions about her meds Initial call taken by: Darnell Level,  December 19, 2009 9:01 AM  Follow-up for Phone Call        The pt son called stating he had spoken with the pt this morning and that she was upset that she was taking so many bp meds. The pt's son called to verify that the pt should be on 5 different bp meds. He knew that the pt's bp had been high in the office. I verified with [name] that the pt should be on these medicaitons per Dr. Antionette Char note. He states that the pt is just upset about her elevated bp. I questioned if the pt has a bp monitor at home. He verified that she did and that he felt like she was taking it very frequently. I explained to [name] that the pt should not be taking her bp frequently. We advise 30 min to 1 hour after morning meds and then once in the evening. I explained she can take it an additional time during the day if she has a HA/dizziness/or another symptom. [name] verbalized understanding.  Follow-up by: Alvis Lemmings, RN, BSN,  December 19, 2009 9:28 AM

## 2010-12-11 NOTE — Assessment & Plan Note (Signed)
Summary: 6WK OK PER PAM   Visit Type:  Follow-up Primary Provider:  [name], MD  CC:  HTN.  History of Present Illness: The patient presents for followup of her difficult to control hypertension. I had increased Aldactone at the last visit. However, she became hyperkalemic with this. Followup demonstrated her potassium to be back down. She does have a mildly elevated creatinine. She has since been started on clonidine. This does cause dry mouth but she is otherwise tolerating it. However, she brings a blood pressure diary today which I am reviewing. Her heart rate is in the 40s to 60s. Unfortunately her blood pressure is still averaging [id number] A999333 systolic though below A999333 for the most part diastolic.  She denies chest discomfort, neck or arm discomfort. She has no shortness of breath, PND or orthopnea.  Current Medications (verified): 1)  Aspirin Ec Lo-Dose 81 Mg Tbec (Aspirin) .... Take 1 Tablet By Mouth Every Morning 2)  Lisinopril-Hydrochlorothiazide 20-12.5 Mg Tabs (Lisinopril-Hydrochlorothiazide) .... 2 Po   By Mouth Once Daily 3)  Synthroid 88 Mcg Tabs (Levothyroxine Sodium) .Marland Kitchen.. 1 By Mouth Once Daily 4)  Temazepam 30 Mg  Caps (Temazepam) .... At Bedtime As Needed 5)  Diltiazem Hcl Coated Beads 120 Mg Xr24h-Cap (Diltiazem Hcl Coated Beads) .... One By Mouth Once Daily 6)  Folic Acid 1 Mg Tabs (Folic Acid) .... Daily 7)  Gas-X 80 Mg Chew (Simethicone) .... Daily 8)  Align  Caps (Probiotic Product) .... As Needed 9)  Clonidine Hcl 0.1 Mg Tabs (Clonidine Hcl) .... One Twice Daily  Allergies (verified): 1)  ! Penicillin V Potassium (Penicillin V Potassium) 2)  Amoxicillin (Amoxicillin) 3)  Codeine Phosphate (Codeine Phosphate)  Past History:  Past Medical History: Reviewed history from 02/15/2009 and no changes required. Hyperlipidemia Hypertension, sees Dr. Percival Spanish Hypothyroidism Osteoarthritis Anemia-NOS Asthma Renal insufficiency, sees Dr. [name] Anxiety Irritable  Bowel Syndrome Gout Adenomatous Colon Polyps 06/2001 Tubulovillous Adenoma 02/2006 Lupus Hemorrhoids Obesity Diverticulosis Carotid Bruits (non obstructive plaque)  Past Surgical History: Reviewed history from 07/13/2009 and no changes required. Lumbar disk surgery 2000 Squamous cell skin cancers removed, per Dr. Sarajane Jews Oophorectomy Rt Appendectomy Low anterior resection Dr. Zella Richer 7/ 2008 Cataract Extraction Colonoscopy 02-20-09 per Dr. Fuller Plan, repeat in 3 yrs  Review of Systems       As stated in the HPI and negative for all other systems.   Vital Signs:  Patient profile:   77 year old female Menstrual status:  postmenopausal Height:      64 inches Weight:      165 pounds BMI:     28.42 Pulse rate:   47 / minute Resp:     16 per minute BP sitting:   154 / 57  (right arm)  Vitals Entered By: Levora Angel, CNA (December 04, 2009 12:11 PM)  Physical Exam  General:  Well developed, well nourished, in no acute distress. Head:  normocephalic and atraumatic Eyes:  anisocoria, conjunctiva and lids normal. Mouth:  Teeth, gums and palate normal. Oral mucosa normal. Neck:  Neck supple, no JVD. No masses, thyromegaly or abnormal cervical nodes. Chest Wall:  no deformities or breast masses noted Lungs:  Clear bilaterally to auscultation and percussion. Abdomen:  Bowel sounds positive; abdomen soft and non-tender without masses, organomegaly, or hernias noted. No hepatosplenomegaly. Msk:  Back normal, normal gait. Muscle strength and tone normal. Extremities:  No clubbing or cyanosis. Neurologic:  Alert and oriented x 3. Skin:  Intact without lesions or rashes. Cervical Nodes:  no  significant adenopathy Axillary Nodes:  no significant adenopathy Inguinal Nodes:  no significant adenopathy Psych:  Normal affect.   Detailed Cardiovascular Exam  Neck    Carotids: Carotids full and equal bilaterally without bruits.      Neck Veins: Normal, no JVD.    Heart     Inspection: no deformities or lifts noted.      Palpation: normal PMI with no thrills palpable.      Auscultation: regular rate and rhythm, S1, S2 without murmurs, rubs, gallops, or clicks.    Vascular    Abdominal Aorta: no palpable masses, pulsations, positive soft audible bruit    Femoral Pulses: normal femoral pulses bilaterally.      Pedal Pulses: pulses normal in all 4 extremities    Radial Pulses: normal radial pulses bilaterally.      Peripheral Circulation: no clubbing, cyanosis, or edema noted with normal capillary refill.     Impression & Recommendations:  Problem # 1:  ESSENTIAL HYPERTENSION, BENIGN (ICD-401.1) Her blood pressure is still not controlled. I will increase her clonidine to 0.2 mg b.i.d. Next step will most likely be the addition of hydralazine.  Problem # 2:  DEPENDENT EDEMA (ICD-782.3) She he is no longer bothered by this. No further evaluation is necessary.  Problem # 3:  RENAL INSUFFICIENCY (ICD-588.9) This has been checked recently. If it is not drawn in the next month between now and the next time I see her I will draw again at that visit.  Patient Instructions: 1)  Your physician recommends that you schedule a follow-up appointment in: 1 month with Dr Percival Spanish 2)  Your physician has recommended you make the following change in your medication: Increase Clonidine to 0.2 mg twice a day Prescriptions: CLONIDINE HCL 0.2 MG TABS (CLONIDINE HCL) one twice a day  #60 x 11   Entered by:   Sim Boast, RN   Authorized by:   Minus Breeding, MD, [facility]   Signed by:   Sim Boast, RN on 12/04/2009   Method used:   Electronically to        [facility] (retail)       [address]       [place], [place]  [address]       Ph: [phone] or [phone]       Fax: [phone]   RxID:   [id number]

## 2010-12-11 NOTE — Assessment & Plan Note (Signed)
Summary: 1 month rov/sl   Visit Type:  Follow-up Primary Provider:  [name], MD  CC:  Htn.  History of Present Illness: The patient presents for follow up of her very difficult to control HTN.  She had a recent visit with Dr. Burt Knack for her BP.  He  add hydralazine and increase her clonidine. Unfortunately she didn't tolerate either of these. She felt very fatigued lethargic and to experience anhedonia. She stopped the hydralazine and get the clonidine at that 0.1 mg b.i.d. dose. Her blood pressures continued to be poorly controlled. She is here with her daughter-in-law and her both frustrated that we have not found a regimen that she tolerates and that works. They don't understand why she needs to be on more than one drug. She is not having chest pressure, neck or arm discomfort. She does have some strong heart rates and hears her heart beating in her ears sometimes particularly at night and she walks or tries to sleep. She is not describing a rapid heart rate and doesn't describe presyncope or syncope. She has not had any flushing or diaphoresis. She's had no new shortness of breath.  Current Medications (verified): 1)  Aspirin Ec Lo-Dose 81 Mg Tbec (Aspirin) .... Take 1 Tablet By Mouth Every Morning 2)  Lisinopril-Hydrochlorothiazide 20-12.5 Mg Tabs (Lisinopril-Hydrochlorothiazide) .... 2 Po   By Mouth Once Daily 3)  Synthroid 88 Mcg Tabs (Levothyroxine Sodium) .Marland Kitchen.. 1 By Mouth Once Daily 4)  Temazepam 30 Mg  Caps (Temazepam) .... At Bedtime As Needed 5)  Diltiazem Hcl Coated Beads 120 Mg Xr24h-Cap (Diltiazem Hcl Coated Beads) .... One By Mouth Once Daily 6)  Folic Acid 1 Mg Tabs (Folic Acid) .... Daily 7)  Gas-X 80 Mg Chew (Simethicone) .... Daily 8)  Align  Caps (Probiotic Product) .... As Needed 9)  Clonidine Hcl 0.1 Mg Tabs (Clonidine Hcl) .... Take One Tablet By Mouth Twice A Day  Past History:  Past Medical History: Reviewed history from 02/15/2009 and no changes  required. Hyperlipidemia Hypertension, sees Dr. Percival Spanish Hypothyroidism Osteoarthritis Anemia-NOS Asthma Renal insufficiency, sees Dr. [name] Anxiety Irritable Bowel Syndrome Gout Adenomatous Colon Polyps 06/2001 Tubulovillous Adenoma 02/2006 Lupus Hemorrhoids Obesity Diverticulosis Carotid Bruits (non obstructive plaque)  Past Surgical History: Reviewed history from 07/13/2009 and no changes required. Lumbar disk surgery 2000 Squamous cell skin cancers removed, per Dr. Sarajane Jews Oophorectomy Rt Appendectomy Low anterior resection Dr. Zella Richer 7/ 2008 Cataract Extraction Colonoscopy 02-20-09 per Dr. Fuller Plan, repeat in 3 yrs  Review of Systems       As stated in the HPI and negative for all other systems.   Vital Signs:  Patient profile:   77 year old female Menstrual status:  postmenopausal Height:      64 inches Weight:      163 pounds BMI:     28.08 Pulse rate:   52 / minute Resp:     16 per minute BP sitting:   194 / 78  (right arm)  Vitals Entered By: Levora Angel, CNA (January 09, 2010 11:10 AM)  Physical Exam  General:  Well developed, well nourished, in no acute distress. Head:  normocephalic and atraumatic Eyes:  anisocoria, fundi not visualized Mouth:  Teeth, gums and palate normal. Oral mucosa normal. Neck:  Neck supple, no JVD. No masses, thyromegaly or abnormal cervical nodes. Chest Wall:  no deformities or breast masses noted Lungs:  Clear bilaterally to auscultation and percussion. Abdomen:  Bowel sounds positive; abdomen soft and non-tender without masses, organomegaly,  or hernias noted. No hepatosplenomegaly. Msk:  Back normal, normal gait. Muscle strength and tone normal. Extremities:  No clubbing or cyanosis. Neurologic:  Alert and oriented x 3. Skin:  Intact without lesions or rashes. Cervical Nodes:  no significant adenopathy Axillary Nodes:  no significant adenopathy Inguinal Nodes:  no significant adenopathy Psych:  Normal  affect.   Detailed Cardiovascular Exam  Neck    Carotids: Carotids full and equal bilaterally without bruits.      Neck Veins: Normal, no JVD.    Heart    Inspection: no deformities or lifts noted.      Palpation: normal PMI with no thrills palpable.      Auscultation: regular rate and rhythm, S1, S2 without murmurs, rubs, gallops, or clicks.    Vascular    Abdominal Aorta: no palpable masses, pulsations, or audible bruits.      Femoral Pulses: normal femoral pulses bilaterally.      Pedal Pulses: normal pedal pulses bilaterally.      Radial Pulses: normal radial pulses bilaterally.      Peripheral Circulation: no clubbing, cyanosis, or edema noted with normal capillary refill.     Impression & Recommendations:  Problem # 1:  BEN HTN HEART DISEASE WITHOUT HEART FAIL (ICD-402.10) Her blood pressure has been very difficult to control.a long discussion about this (greater than one half hour with this appointment). I considered using Tekturna but we have no samples. I will try Hytrin 1 mg q.h.s. She'll remain on the other 3 meds as listed. I will check with her nephrologist to see if she's had renal ultrasound to look for renal artery stenosis. If not I will order this.  Problem # 2:  CAROTID STENOSIS (ICD-433.10) She has nonobstructive disease. We will follow this up as indicated. She will continue with risk reduction.  Problem # 3:  RENAL INSUFFICIENCY (ICD-588.9) As above I will consult with her nephrologist.  Patient Instructions: 1)  Your physician recommends that you schedule a follow-up appointment in: [place] 2)  Your physician has recommended you make the following change in your medication: START MINIPRES 1 MG AT BEDTIME Prescriptions: MINIPRESS 1 MG CAPS (PRAZOSIN HCL) ONE AT BEDTIME  #30 x 11   Entered by:   Sim Boast, RN   Authorized by:   Minus Breeding, MD, [facility]   Signed by:   Sim Boast, RN on 01/09/2010   Method used:    Electronically to        [facility] (retail)       [address]       [place], [place]  [address]       Ph: [phone] or [phone]       Fax: [phone]   RxID:   [id number]

## 2010-12-11 NOTE — Miscellaneous (Signed)
  Clinical Lists Changes  Observations: Added new observation of ABDOM US: Small caliber abdominal aorta and proximal common iliac arteries, without evidence of focal dialtion. Mild aortic iliac atherosclerosis without stenosis. (10/18/2009 11:46)      [place] of Abdomen  Procedure date:  10/18/2009  Findings:      Small caliber abdominal aorta and proximal common iliac arteries, without evidence of focal dialtion. Mild aortic iliac atherosclerosis without stenosis.

## 2010-12-11 NOTE — Miscellaneous (Signed)
Summary: Orders Update  Clinical Lists Changes  Orders: Added new Test order of Carotid Duplex (Carotid Duplex) - Signed 

## 2010-12-11 NOTE — Progress Notes (Signed)
Summary: Pt [facility]  Phone Note Call from Patient Call back at [facility] Phone [phone] Call back at [phone] cell   Caller: Patient Summary of Call: Pt called and is req refill  of Lorazepam. Dr. Percival Spanish is req this pt go back on anxiety med due to high blood pressure. [facility] [address].        Initial call taken by: Braulio Bosch,  February 01, 2010 3:48 PM  Follow-up for Phone Call        call in Lorazepam 0.5 mg q 6 hours as needed anxiety, #60 with 2 rf Follow-up by: [name] MD,  February 02, 2010 12:31 PM  Additional Follow-up for Phone Call Additional follow up Details #1::        Phone Call Completed Additional Follow-up by: Chipper Oman, RN,  February 02, 2010 1:13 PM    New/Updated Medications: LORAZEPAM 0.5 MG TABS (LORAZEPAM) 1 q6h as needed anxiety Prescriptions: LORAZEPAM 0.5 MG TABS (LORAZEPAM) 1 q6h as needed anxiety  #60 x 2   Entered by:   Chipper Oman, RN   Authorized by:   [name] MD   Signed by:   Chipper Oman, RN on 02/02/2010   Method used:   Historical   RxIDHM:[id number]

## 2010-12-11 NOTE — Progress Notes (Signed)
  Pt came in [place] to [facility] for processing [facility]  Apr 03, 2010 1:13 PM    Appended Document:  I did not send ROi over to [facility], pt came back into office and asked If I could handle myself this was urgent, I faxed the Most Recent records over to [facility] @ fax [phone]

## 2010-12-11 NOTE — Progress Notes (Signed)
Summary: CALLING ABOUT NEW MEDICATION NAD B/P STILL BEING HIGH  Phone Note Call from Patient Call back at Home Phone [phone] Call back at [phone]   Caller: Patient Summary of Call: PT [place] B/P STILL BEING HIGH Initial call taken by: Delsa Sale,  December 26, 2009 11:00 AM  Follow-up for Phone Call        voicemail box not set up and no answer at call back number listed/  Pam Fleming-Hayes, RN  BP STILL RUNNING HIGH 185-190 OVER 80'S, THE LOWEST IT'S BEEN IS 183/75  HR FROM 53 TO 61. WOOSY HEAD FEELING WITH MOVING AROUND.  HAVING H/A OFF AND ON FOR SEVERAL DAYS.  SHE DOESN'T FEEL LIKE ANY OF THE NEW MEDS SHE WAS STARTED ON ARE HELPING HER VERY MUCH BUT THEY DO MAKE HER FEEL BAD.  SHE WOULD LIKE TO KNOW IF THERE ARE ANY OTHER TESTS SHE SHOULD HAVE TO KNOW WHAT IS CAUSING HER HIGH BLOOD PRESSURE.  ? RENAL DOPPLER  ? 24 HR URINE  ETC...  WILL DISCUSS WITH DR [facility] 12/27/2009 AND CALL PT BACK WITH ORDERS   Follow-up by: Sim Boast, RN,  December 26, 2009 5:45 PM  Additional Follow-up for Phone Call Additional follow up Details #1::        Pt will need to see me in the office. Additional Follow-up by: Minus Breeding, MD, [facility],  December 31, 2009 9:20 PM     Appended Document: CALLING ABOUT NEW MEDICATION NAD B/P STILL BEING HIGH I will schedule the next available appt.  Appended Document: CALLING ABOUT NEW MEDICATION NAD B/P STILL BEING HIGH APPT SCHEDULED FOR 01/09/2010 WITH DR [facility]

## 2010-12-11 NOTE — Consult Note (Signed)
Summary: [facility]   Imported By: Laural Benes 02/27/2010 09:26:08  _____________________________________________________________________  External Attachment:    Type:   Image     Comment:   External Document

## 2010-12-11 NOTE — Assessment & Plan Note (Signed)
Summary: ok per pam/per check out/sf   Visit Type:  Follow-up Primary Provider:  [name], MD  CC:  HTN.  History of Present Illness: The patient presents for followup of her difficult to control hypertension. She has been quite frustrated over his inability to control the blood pressure. She hasn't tolerated many medications. She came with her daughter-in-law that last visit and they expressed their concern about the lack of progress. I started her on Minipress at a very low dose. This did not control the blood pressure so I went from 1 mg q.h.s. to t.i.d. over the phone. However, she didn't tolerate this with increased fatigue. Her blood pressures have not been controlled on b.i.d. dosing though they have been slightly better. She brings a list ranging from 212/86-155/70. Heart rates have been in the high 40s and 50s. She's had suggestions from family members as to drug regimens that might work for her. She has reported anxiety and depression over her current situation. She's not having any new chest discomfort, neck or on discomfort. She's not having any new palpitations, presyncope or syncope. She's not having any new PND or orthopnea. She had some left lower extremity edema.  Current Medications (verified): 1)  Aspirin Ec Lo-Dose 81 Mg Tbec (Aspirin) .... Take 1 Tablet By Mouth Every Morning 2)  Lisinopril-Hydrochlorothiazide 20-12.5 Mg Tabs (Lisinopril-Hydrochlorothiazide) .... 2 Po   By Mouth Once Daily 3)  Synthroid 88 Mcg Tabs (Levothyroxine Sodium) .Marland Kitchen.. 1 By Mouth Once Daily 4)  Temazepam 30 Mg  Caps (Temazepam) .... At Bedtime As Needed 5)  Diltiazem Hcl Coated Beads 120 Mg Xr24h-Cap (Diltiazem Hcl Coated Beads) .... One By Mouth Once Daily 6)  Gas-X 80 Mg Chew (Simethicone) .... Daily 7)  Align  Caps (Probiotic Product) .... As Needed 8)  Clonidine Hcl 0.1 Mg Tabs (Clonidine Hcl) .... Take One Tablet By Mouth Twice A Day 9)  Minipress 1 Mg Caps (Prazosin Hcl) .... 2 By Mouth  Daily  Allergies (verified): 1)  ! Penicillin V Potassium (Penicillin V Potassium) 2)  Amoxicillin (Amoxicillin) 3)  Codeine Phosphate (Codeine Phosphate)  Past History:  Past Medical History: Hyperlipidemia Hypertension, sees Dr. Percival Spanish Hypothyroidism Osteoarthritis Anemia-NOS Asthma Renal insufficiency, sees Dr. [name] Renal doppler US 03-17-08 showed cortical thinning but negative for renal artery stenosis Anxiety Irritable Bowel Syndrome Gout Adenomatous Colon Polyps 06/2001 Tubulovillous Adenoma 02/2006 Lupus Hemorrhoids Obesity Diverticulosis Carotid Bruits (non obstructive plaque)  Past Surgical History: Reviewed history from 07/13/2009 and no changes required. Lumbar disk surgery 2000 Squamous cell skin cancers removed, per Dr. Sarajane Jews Oophorectomy Rt Appendectomy Low anterior resection Dr. Zella Richer 7/ 2008 Cataract Extraction Colonoscopy 02-20-09 per Dr. Fuller Plan, repeat in 3 yrs  Review of Systems       As stated in the HPI and negative for all other systems.   Vital Signs:  Patient profile:   77 year old female Menstrual status:  postmenopausal Height:      64 inches Weight:      165 pounds BMI:     28.42 Pulse rate:   50 / minute Resp:     16 per minute BP sitting:   162 / 76  (right arm)  Vitals Entered By: Levora Angel, CNA (January 29, 2010 11:53 AM)  Physical Exam  General:  Well developed, well nourished, in no acute distress. Head:  normocephalic and atraumatic Eyes:  anisocoria, fundi not visualized Mouth:  Teeth, gums and palate normal. Oral mucosa normal. Neck:  No deformities, masses, or tenderness noted.  Chest Wall:  no deformities or breast masses noted Lungs:  Normal respiratory effort, chest expands symmetrically. Lungs are clear to auscultation, no crackles or wheezes. Abdomen:  Bowel sounds positive; abdomen soft and non-tender without masses, organomegaly, or hernias noted. No hepatosplenomegaly. Msk:  Back normal, normal  gait. Muscle strength and tone normal. Extremities:  no edema Neurologic:  Alert and oriented x 3. Skin:  Intact without lesions or rashes. Psych:  Normal affect.   Detailed Cardiovascular Exam  Neck    Carotids: Carotids full and equal bilaterally without bruits.      Neck Veins: Normal, no JVD.    Heart    Inspection: no deformities or lifts noted.      Palpation: normal PMI with no thrills palpable.      Auscultation: regular rate and rhythm, S1, S2 without murmurs, rubs, gallops, or clicks.    Vascular    Abdominal Aorta: no palpable masses, pulsations, or audible bruits.      Femoral Pulses: normal femoral pulses bilaterally.      Pedal Pulses: normal pedal pulses bilaterally.      Radial Pulses: normal radial pulses bilaterally.      Peripheral Circulation: no clubbing, cyanosis, or edema noted with normal capillary refill.     EKG  Procedure date:  01/29/2010  Findings:      sinus bradycardia, rate 50, axis within normal limits, intervals within normal limits, no acute ST-T wave changes.  Impression & Recommendations:  Problem # 1:  BEN HTN HEART DISEASE WITHOUT HEART FAIL (ICD-402.10) I had a long conversation with the patient today. It's clear that she is depressed, anxious and frustrated over her blood pressure and the inability to find a regimen that she tolerates and that works. I think she and her family have lost confidence in me and I have suggested that she might want a second opinion. She does not want to do that at this point. I reviewed the records to make sure she's had a renal Doppler which was done in 2009. We can repeat this. I checked to make sure that she has a 24-hour urine for metanephrines and also an aldosterone level. I have tried to convince her to take the Minipress t.i.d. having her understand that because of her many reactions to drugs I moving very slowly on this and that I don't expect to see blood pressures fall quickly into line. I explained  to her also that suggestion from certain family members on what works for them might not necessarily apply to her. Finally I suggested that we should try to avoid jumping from a regimen to regimen because of lack of progress her symptoms without giving those regimens and individual medications time to work. Orders: EKG w/ Interpretation (93000)  Problem # 2:  DEPENDENT EDEMA (ICD-782.3) This his mild. She will continue with no change in therapy.  Problem # 3:  CAROTID STENOSIS (ICD-433.10) She he is due to have followup of this. Her updated medication list for this problem includes:    Aspirin Ec Lo-dose 81 Mg Tbec (Aspirin) .Marland Kitchen... Take 1 tablet by mouth every morning  Other Orders: T-Aldosterone [id number]) Renal Artery Duplex (Renal Artery Duplex) T-Urine 24 Hr. Metanephrines [phone]) T-Urine 24hr. VMA/HVA ([id number])  Patient Instructions: 1)  Your physician recommends that you schedule a follow-up appointment after renal doppler 2)  Your physician recommends that you have lab work today  Aldosterone level 24 hour urine to VMA and metanephrines. 3)  Your physician has recommended you make  the following change in your medication: Increase Minipress to 1 mg three times a day 4)  Your physician has requested that you have a renal artery duplex. During this test, an ultrasound is used to evaluate blood flow to the kidneys. Allow one hour for this exam. Do not eat after midnight the day before and avoid carbonated beverages. Take your medications as you usually do. Prescriptions: MINIPRESS 1 MG CAPS (PRAZOSIN HCL) one three times a day  #90 x 11   Entered by:   Sim Boast, RN   Authorized by:   Minus Breeding, MD, [facility]   Signed by:   Sim Boast, RN on 01/29/2010   Method used:   Electronically to        [facility] (retail)       [address]       [place], [place]  [address]       Ph: [phone] or [phone]       Fax: [phone]   RxIDKB:[id number]   Appended Document: ok per pam/per check out/sf Called patient and left message on machine of normal results

## 2010-12-11 NOTE — Assessment & Plan Note (Signed)
Summary: flu shot/cjr  Nurse Visit   Allergies: 1)  ! Penicillin V Potassium (Penicillin V Potassium) 2)  Amoxicillin (Amoxicillin) 3)  Codeine Phosphate (Codeine Phosphate)  Review of Systems       Flu Vaccine Consent Questions     Do you have a history of severe allergic reactions to this vaccine? no    Any prior history of allergic reactions to egg and/or gelatin? no    Do you have a sensitivity to the preservative Thimersol? no    Do you have a past history of Guillan-Barre Syndrome? no    Do you currently have an acute febrile illness? no    Have you ever had a severe reaction to latex? no    Vaccine information given and explained to patient? yes    Are you currently pregnant? no    Lot Number:[id number]   Exp Date:05/11/2011   Site Given  Left Deltoid IM    Orders Added: 1)  Flu Vaccine 59yrs + MEDICARE PATIENTS [Q2039] 2)  Administration Flu vaccine - MCR [id number]

## 2010-12-11 NOTE — Miscellaneous (Signed)
  Clinical Lists Changes  Observations: Added new observation of RENAL USOUND: Normal caliber abdominal aorta. Normal bilateral kidney size. Normal renal arteries, bilaterally. Left renal not well visualized. (03/01/2010 15:10) Added new observation of US CAROTID: Mild plaque bilaterally 40-59% ICA stenosis Abnormal thyroid appearance  f/u 1 year (03/01/2010 15:08)      Carotid Doppler  Procedure date:  03/01/2010  Findings:      Mild plaque bilaterally 40-59% ICA stenosis Abnormal thyroid appearance  f/u 1 year  Renal US  Procedure date:  03/01/2010  Findings:      Normal caliber abdominal aorta. Normal bilateral kidney size. Normal renal arteries, bilaterally. Left renal not well visualized.

## 2010-12-11 NOTE — Progress Notes (Signed)
Summary:  B/P HIGH AND HAD CHEST PAINS LAST NIGHT  Phone Note Call from Patient Call back at [facility] Phone [phone]   Caller: Patient Summary of Call: PT B/P IS HIGH 211/88 YESTERDAY 193/85 THIS MORNING FEELS THAT THE NEW MEDIICATION  IS NOT [place] CHEST PAINS LAST NIGHT. Initial call taken by: Delsa Sale,  January 18, 2010 10:18 AM  Follow-up for Phone Call        Pt was here on 01/09/10 and started on Prazosin for HTN at bedtime. She was told to give it 2 weeks. It has been 10 days and her BP has been 193-211/ 85-88. The lowest it has been is 179/82.  She had some CP last night in the center of her chest it did go away, she has not had CP in a very long time. Her back is sore in the center today does not know if that is related. NO CP now. She wants to know what she should do now that this is not working. I will s/w Dr. Percival Spanish this afternoon when he is here and see what he recommends.  Follow-up by: Barnett Abu, RN, BSN,  January 18, 2010 10:28 AM  Additional Follow-up for Phone Call Additional follow up Details #1::        Per Dr. Percival Spanish she can increase her Minipress to three times a day. Pt is aware and will call with any issues.  Additional Follow-up by: Barnett Abu, RN, BSN,  January 18, 2010 5:08 PM    New/Updated Medications: MINIPRESS 1 MG CAPS (PRAZOSIN HCL) three times a day Prescriptions: MINIPRESS 1 MG CAPS (PRAZOSIN HCL) three times a day  #90 x 6   Entered by:   Barnett Abu, RN, BSN   Authorized by:   Minus Breeding, MD, [facility]   Signed by:   Barnett Abu, RN, BSN on 01/18/2010   Method used:   Electronically to        [facility] (retail)       [address]       [place], [place]  [address]       Ph: [phone] or [phone]       Fax: [phone]   RxID:   [id number]

## 2010-12-11 NOTE — Letter (Signed)
Summary: [facility]   Imported By: Laural Benes 08/22/2010 13:21:09  _____________________________________________________________________  External Attachment:    Type:   Image     Comment:   External Document

## 2010-12-11 NOTE — Assessment & Plan Note (Signed)
Summary: f/u--ch.   History of Present Illness Visit Type: Follow-up Visit Primary GI MD: Joylene Igo MD [facility] Primary Provider: [name], MD Chief Complaint: follow up IBS, pt states she has gas and diarrhea, but otherwise she is doing well History of Present Illness:   This is an 77 year old female, who returns today for followup of irritable bowel syndrome. She states she has frequent postprandial stools, sometimes loose and sometimes formed. She notes frequent gas and bloating that appears to respond to Gas-X. She is unsure that Align or other probiotics help. She last underwent colonoscopy in April 2010.   GI Review of Systems    Reports bloating.      Denies abdominal pain, acid reflux, belching, chest pain, dysphagia with liquids, dysphagia with solids, heartburn, loss of appetite, nausea, vomiting, vomiting blood, weight loss, and  weight gain.      Reports diarrhea.     Denies anal fissure, black tarry stools, change in bowel habit, constipation, diverticulosis, fecal incontinence, heme positive stool, hemorrhoids, irritable bowel syndrome, jaundice, light color stool, liver problems, rectal bleeding, and  rectal pain. Preventive Screening-Counseling & Management      Drug Use:  no.     Current Medications (verified): 1)  Aspirin Ec Lo-Dose 81 Mg Tbec (Aspirin) .... Take 1 Tablet By Mouth Every Morning 2)  Amlodipine Besylate 5 Mg Tabs (Amlodipine Besylate) .... Take 1 1/2 Tablets By Mouth Once Daily 3)  Synthroid 88 Mcg Tabs (Levothyroxine Sodium) .Marland Kitchen.. 1 By Mouth Once Daily 4)  Temazepam 30 Mg  Caps (Temazepam) .... At Bedtime As Needed 5)  Gas-X 80 Mg Chew (Simethicone) .... Daily 6)  Align  Caps (Probiotic Product) .... As Needed 7)  Clonidine Hcl 0.1 Mg Tabs (Clonidine Hcl) .... Take One Tablet By Mouth Twice A Day 8)  Chlorthalidone 25 Mg Tabs (Chlorthalidone) .... Take 1 Tablet By Mouth Once A Day 9)  Micardis 40 Mg Tabs (Telmisartan) .... Take 1 Tablet By Mouth  Once A Day  Allergies (verified): 1)  ! Penicillin V Potassium (Penicillin V Potassium) 2)  Amoxicillin (Amoxicillin) 3)  Codeine Phosphate (Codeine Phosphate)  Past History:  Past Medical History: Hyperlipidemia Hypertension, sees Dr. Percival Spanish Hypothyroidism Osteoarthritis Anemia-NOS Asthma Renal insufficiency, sees Dr. [name] Renal doppler US 03-17-08 showed cortical thinning but negative for renal artery stenosis Anxiety Irritable Bowel Syndrome Gout Adenomatous Colon Polyps 06/2001 Tubulovillous Adenoma 02/2006 Hemorrhoids Obesity Diverticulosis Carotid Bruits (non obstructive plaque)  Past Surgical History: Reviewed history from 07/13/2009 and no changes required. Lumbar disk surgery 2000 Squamous cell skin cancers removed, per Dr. Sarajane Jews Oophorectomy Rt Appendectomy Low anterior resection Dr. Zella Richer 7/ 2008 Cataract Extraction Colonoscopy 02-20-09 per Dr. Fuller Plan, repeat in 3 yrs  Family History: Family History of CAD Female 1st degree relative <50 Family History of Colon CA 1st degree relative <60-Daughter Family History Diabetes 1st degree relative Family History Hypertension Family History of Stroke M 1st degree relative <50  Social History: Widowed Retired Never Smoked Alcohol use-no Illicit Drug Use - no Drug Use:  no  Review of Systems       The pertinent positives and negatives are noted as above and in the HPI. All other ROS were reviewed and were negative.   Vital Signs:  Patient profile:   77 year old female Menstrual status:  postmenopausal Height:      64 inches Weight:      165 pounds BMI:     28.42 Pulse rate:   52 / minute Pulse rhythm:  regular BP sitting:   116 / 62  (left arm) Cuff size:   regular  Vitals Entered By: Abelino Derrick CMA Deborra Medina) (July 02, 2010 2:44 PM)  Physical Exam  General:  Well developed, well nourished, no acute distress. Head:  Normocephalic and atraumatic. Eyes:  PERRLA, no icterus. Mouth:   No deformity or lesions, dentition normal. Lungs:  Clear throughout to auscultation. Heart:  Regular rate and rhythm; no murmurs, rubs,  or bruits. Abdomen:  Soft, nontender and nondistended. No masses, hepatosplenomegaly or hernias noted. Normal bowel sounds. Psych:  Alert and cooperative. Normal mood and affect.  Impression & Recommendations:  Problem # 1:  IRRITABLE BOWEL SYNDROME (ICD-564.1) Begin hyoscyamine 1-2 before meals. Increase Gas-X to t.i.d. p.r.n. May continue probiotics if they help her GI symtoms otherwise discontinue. Ongoing followup with her primary physician.  Problem # 2:  COLONIC POLYPS, HX OF (ICD-V12.72) Prior history of tubulovillous adenomatous colon polyps. Surveillance colonoscopy in April 2013 if her health status is appropriate for surveillance.  Patient Instructions: 1)  Pick up your prescriptions from your pharmacy.  2)  Increase your Gas-X three times a day. 3)  Please continue current medications.  4)  Copy sent to : [name], MD 5)  The medication list was reviewed and reconciled.  All changed / newly prescribed medications were explained.  A complete medication list was provided to the patient / caregiver.  Prescriptions: HYOSCYAMINE SULFATE 0.125 MG TABS (HYOSCYAMINE SULFATE) 1-2 tablets by mouth before meals as needed  #60 x 11   Entered by:   Marlon Pel CMA ([place])   Authorized by:   Ladene Artist MD [facility]   Signed by:   Ladene Artist MD FACG on 07/02/2010   Method used:   Electronically to        [facility] (retail)       [address]       [place], [place]  [address]       Ph: [phone] or [phone]       Fax: [phone]   RxID:   [id number]

## 2010-12-11 NOTE — Assessment & Plan Note (Signed)
Summary: renew meds, fasting/cdw   Vital Signs:  Patient profile:   77 year old female Menstrual status:  postmenopausal Weight:      164 pounds BMI:     28.25 Temp:     98.0 degrees F oral Pulse rate:   60 / minute Pulse rhythm:   regular BP sitting:   170 / 70  (left arm) Cuff size:   regular  Vitals Entered By: Verne Carrow, RN CC: Med check, fasting for labs. Needs refill Temazepam., Hypertension Management   History of Present Illness: Here for labs and follow up. She saw Dr. Percival Spanish one week ago, and he added Prazocin to her regimen. Of course it is a little early to tell if this will help. At least she seems to be tolerating it well so far.  She is fasting for labs today.   Hypertension History:      Positive major cardiovascular risk factors include female age 40 years old or older, hyperlipidemia, and hypertension.  Negative major cardiovascular risk factors include non-tobacco-user status.    Allergies: 1)  ! Penicillin V Potassium (Penicillin V Potassium) 2)  Amoxicillin (Amoxicillin) 3)  Codeine Phosphate (Codeine Phosphate)  Past History:  Past Medical History: Hyperlipidemia Hypertension, sees Dr. Percival Spanish Hypothyroidism Osteoarthritis Anemia-NOS Asthma Renal insufficiency, sees Dr. [name] renal doppler US 03-17-08 showed cortical thinning but negative for renal artery stenosis Anxiety Irritable Bowel Syndrome Gout Adenomatous Colon Polyps 06/2001 Tubulovillous Adenoma 02/2006 Lupus Hemorrhoids Obesity Diverticulosis Carotid Bruits (non obstructive plaque)  Past Surgical History: Reviewed history from 07/13/2009 and no changes required. Lumbar disk surgery 2000 Squamous cell skin cancers removed, per Dr. Sarajane Jews Oophorectomy Rt Appendectomy Low anterior resection Dr. Zella Richer 7/ 2008 Cataract Extraction Colonoscopy 02-20-09 per Dr. Fuller Plan, repeat in 3 yrs  Review of Systems  The patient denies anorexia, fever, weight loss, weight gain, vision  loss, decreased hearing, hoarseness, chest pain, syncope, dyspnea on exertion, peripheral edema, prolonged cough, headaches, hemoptysis, abdominal pain, melena, hematochezia, severe indigestion/heartburn, hematuria, incontinence, genital sores, muscle weakness, suspicious skin lesions, transient blindness, difficulty walking, depression, unusual weight change, abnormal bleeding, enlarged lymph nodes, angioedema, breast masses, and testicular masses.    Physical Exam  General:  Well-developed,well-nourished,in no acute distress; alert,appropriate and cooperative throughout examination Neck:  No deformities, masses, or tenderness noted. Lungs:  Normal respiratory effort, chest expands symmetrically. Lungs are clear to auscultation, no crackles or wheezes. Heart:  Normal rate and regular rhythm. S1 and S2 normal without gallop, murmur, click, rub or other extra sounds. Extremities:  no edema   Impression & Recommendations:  Problem # 1:  ESSENTIAL HYPERTENSION, BENIGN (ICD-401.1)  Her updated medication list for this problem includes:    Lisinopril-hydrochlorothiazide 20-12.5 Mg Tabs (Lisinopril-hydrochlorothiazide) .Marland Kitchen... 2 po   by mouth once daily    Diltiazem Hcl Coated Beads 120 Mg Xr24h-cap (Diltiazem hcl coated beads) ..... One by mouth once daily    Clonidine Hcl 0.1 Mg Tabs (Clonidine hcl) .Marland Kitchen... Take one tablet by mouth twice a day    Minipress 1 Mg Caps (Prazosin hcl) ..... One at bedtime  Orders: Venipuncture [id number]) TLB-Lipid Panel (80061-LIPID) TLB-BMP (Basic Metabolic Panel-BMET) (99991111) TLB-CBC Platelet - w/Differential (85025-CBCD) TLB-Hepatic/Liver Function Pnl (80076-HEPATIC) TLB-TSH (Thyroid Stimulating Hormone) (84443-TSH) UA Dipstick w/o Micro (automated)  (81003)  Problem # 2:  DEPENDENT EDEMA (ICD-782.3)  Her updated medication list for this problem includes:    Lisinopril-hydrochlorothiazide 20-12.5 Mg Tabs (Lisinopril-hydrochlorothiazide) .Marland Kitchen... 2 po   by  mouth once daily  Problem # 3:  RENAL INSUFFICIENCY (ICD-588.9)  Problem # 4:  HYPOTHYROIDISM (ICD-244.9)  Her updated medication list for this problem includes:    Synthroid 88 Mcg Tabs (Levothyroxine sodium) .Marland Kitchen... 1 by mouth once daily  Complete Medication List: 1)  Aspirin Ec Lo-dose 81 Mg Tbec (Aspirin) .... Take 1 tablet by mouth every morning 2)  Lisinopril-hydrochlorothiazide 20-12.5 Mg Tabs (Lisinopril-hydrochlorothiazide) .... 2 po   by mouth once daily 3)  Synthroid 88 Mcg Tabs (Levothyroxine sodium) .Marland Kitchen.. 1 by mouth once daily 4)  Temazepam 30 Mg Caps (Temazepam) .... At bedtime as needed 5)  Diltiazem Hcl Coated Beads 120 Mg Xr24h-cap (Diltiazem hcl coated beads) .... One by mouth once daily 6)  Gas-x 80 Mg Chew (Simethicone) .... Daily 7)  Align Caps (Probiotic product) .... As needed 8)  Clonidine Hcl 0.1 Mg Tabs (Clonidine hcl) .... Take one tablet by mouth twice a day 9)  Minipress 1 Mg Caps (Prazosin hcl) .... One at bedtime  Hypertension Assessment/Plan:      The patient's hypertensive risk group is category B: At least one risk factor (excluding diabetes) with no target organ damage.  Her calculated 10 year risk of coronary heart disease is 20 %.  Today's blood pressure is 170/70.     Patient Instructions: 1)  get labs today. Stay with the current regimen. Prescriptions: TEMAZEPAM 30 MG  CAPS (TEMAZEPAM) at bedtime as needed  #30 x 5   Entered and Authorized by:   [name] MD   Signed by:   [name] MD on 01/16/2010   Method used:   Print then Give to Patient   RxID:   [id number]   Appended Document: renew meds, fasting/cdw  Laboratory Results   Urine Tests    Routine Urinalysis   Color: yellow Appearance: Clear Glucose: negative   (Normal Range: Negative) Bilirubin: negative   (Normal Range: Negative) Ketone: negative   (Normal Range: Negative) Spec. Gravity: 1.020   (Normal Range: 1.003-1.035) Blood: trace-intact   (Normal Range:  Negative) pH: 5.0   (Normal Range: 5.0-8.0) Protein: negative   (Normal Range: Negative) Urobilinogen: 0.2   (Normal Range: 0-1) Nitrite: negative   (Normal Range: Negative) Leukocyte Esterace: negative   (Normal Range: Negative)    Comments: Joyce Gross  January 16, 2010 11:00 AM

## 2010-12-11 NOTE — Miscellaneous (Signed)
Summary: Orders Update  Clinical Lists Changes  Orders: Added new Test order of T-Urine 24hr. VMA/HVA ([id number]) - Signed Added new Test order of T-Urine 24 Hr. Metanephrines [phone]) - Signed

## 2010-12-11 NOTE — Assessment & Plan Note (Signed)
Summary: ROV/F/U RENAL/CAROTID/LABS/D.MILLER   Visit Type:  Follow-up Primary Provider:  [name], MD  CC:  HTN.  History of Present Illness: The patient presents for evaluation of very difficult to control hypertension. I have most recently tried Minipress for her blood pressure. To look for secondary cause I repeated renal ultrasound which had been done a few years before. This demonstrated no evidence of vascular disease. I wondered 24-hour urine for VMA and metanephrines and this was normal. I reviewed extensively her past history demonstrating that she has been intolerant for one reason or another to basically all classes of antihypertensives except for those she is on now. She expresses frustration with her hypertension. She expresses frustration with medications. She is however adhering to the current regimen though it causes fatigue. She brings a blood pressure diary and her blood pressures are still not well controlled with systolics ranging from Q000111Q to 210. It seems to have urgent 170s to 180s. Diastolics are well controlled. She has no new chest discomfort, neck or arm discomfort. She has no new palpitations, presyncope or syncope.  Current Medications (verified): 1)  Aspirin Ec Lo-Dose 81 Mg Tbec (Aspirin) .... Take 1 Tablet By Mouth Every Morning 2)  Lisinopril-Hydrochlorothiazide 20-12.5 Mg Tabs (Lisinopril-Hydrochlorothiazide) .... 2 Po   By Mouth Once Daily 3)  Synthroid 88 Mcg Tabs (Levothyroxine Sodium) .Marland Kitchen.. 1 By Mouth Once Daily 4)  Temazepam 30 Mg  Caps (Temazepam) .... At Bedtime As Needed 5)  Diltiazem Hcl Coated Beads 120 Mg Xr24h-Cap (Diltiazem Hcl Coated Beads) .... One By Mouth Once Daily 6)  Gas-X 80 Mg Chew (Simethicone) .... Daily 7)  Align  Caps (Probiotic Product) .... As Needed 8)  Clonidine Hcl 0.1 Mg Tabs (Clonidine Hcl) .... Take One Tablet By Mouth Twice A Day 9)  Minipress 1 Mg Caps (Prazosin Hcl) .... One Three Times A Day 10)  Lorazepam 0.5 Mg Tabs  (Lorazepam) .Marland Kitchen.. 1 Q6h As Needed Anxiety  Allergies (verified): 1)  ! Penicillin V Potassium (Penicillin V Potassium) 2)  Amoxicillin (Amoxicillin) 3)  Codeine Phosphate (Codeine Phosphate)  Past History:  Past Medical History: Reviewed history from 01/29/2010 and no changes required. Hyperlipidemia Hypertension, sees Dr. Percival Spanish Hypothyroidism Osteoarthritis Anemia-NOS Asthma Renal insufficiency, sees Dr. [name] Renal doppler US 03-17-08 showed cortical thinning but negative for renal artery stenosis Anxiety Irritable Bowel Syndrome Gout Adenomatous Colon Polyps 06/2001 Tubulovillous Adenoma 02/2006 Lupus Hemorrhoids Obesity Diverticulosis Carotid Bruits (non obstructive plaque)  Past Surgical History: Reviewed history from 07/13/2009 and no changes required. Lumbar disk surgery 2000 Squamous cell skin cancers removed, per Dr. Sarajane Jews Oophorectomy Rt Appendectomy Low anterior resection Dr. Zella Richer 7/ 2008 Cataract Extraction Colonoscopy 02-20-09 per Dr. Fuller Plan, repeat in 3 yrs  Review of Systems       As stated in the HPI and negative for all other systems.   Vital Signs:  Patient profile:   77 year old female Menstrual status:  postmenopausal Height:      64 inches Weight:      166 pounds BMI:     28.60 Pulse rate:   54 / minute Resp:     16 per minute BP sitting:   157 / 56  (left arm)  Vitals Entered By: Levora Angel, CNA (March 08, 2010 3:34 PM)  Physical Exam  General:  Well developed, well nourished, in no acute distress. Head:  normocephalic and atraumatic Eyes:  anisocoria, fundi not visualized Mouth:  Teeth, gums and palate normal. Oral mucosa normal. Neck:  No deformities,  masses, or tenderness noted. Chest Wall:  no deformities or breast masses noted Lungs:  Normal respiratory effort, chest expands symmetrically. Lungs are clear to auscultation, no crackles or wheezes. Abdomen:  Bowel sounds positive; abdomen soft and non-tender without  masses, organomegaly, or hernias noted. No hepatosplenomegaly. Msk:  Back normal, normal gait. Muscle strength and tone normal. Extremities:  no edema Neurologic:  Alert and oriented x 3. Psych:  Normal affect.   Detailed Cardiovascular Exam  Neck    Carotids: Carotids full and equal bilaterally without bruits.      Neck Veins: Normal, no JVD.    Heart    Inspection: no deformities or lifts noted.      Palpation: normal PMI with no thrills palpable.      Auscultation: regular rate and rhythm, S1, S2 without murmurs, rubs, gallops, or clicks.    Vascular    Abdominal Aorta: no palpable masses, pulsations, or audible bruits.      Femoral Pulses: normal femoral pulses bilaterally.      Pedal Pulses: normal pedal pulses bilaterally.      Radial Pulses: normal radial pulses bilaterally.      Peripheral Circulation: no clubbing, cyanosis, or edema noted with normal capillary refill.     Impression & Recommendations:  Problem # 1:  ESSENTIAL HYPERTENSION, BENIGN (ICD-401.1) again, have been through her record carefully. She has had multiple medication intolerances. There have been many medication changes. At this point we need to settle on a medication regimen that though in perfect in terms of side effects is tolerable and controlled her blood pressure. Toward that end I would like to persist with the current regimen and increase her Minipress by adding 1 mg to her evening dose. She will continue to monitor her blood pressure and we will followup with further med changes as indicated.  Patient Instructions: 1)  Your physician recommends that you schedule a follow-up appointment as directed 2)  Your physician has recommended you make the following change in your medication: Increase Minipress to one twice a day and 2 at bedtime Prescriptions: MINIPRESS 1 MG CAPS (PRAZOSIN HCL) take one twice a day and 2 at bedtime  #120 x 6   Entered by:   Sim Boast, RN   Authorized by:    Minus Breeding, MD, [facility]   Signed by:   Sim Boast, RN on 03/08/2010   Method used:   Electronically to        [facility] (retail)       [address]       [place], [place]  [address]       Ph: [phone] or [phone]       Fax: [phone]   RxID:   [id number]

## 2010-12-11 NOTE — Progress Notes (Signed)
  Walk in Patient Form Recieved " BP readings left by pt." forwarded to Message Nurse [facility]  December 28, 2009 3:16 PM

## 2010-12-11 NOTE — Progress Notes (Signed)
Summary: b/p issues with new medication   Phone Note Call from Patient Call back at Home Phone [phone] Call back at [phone]    Caller: Patient Reason for Call: Talk to Nurse Details for Reason: Per pt calling was seen on 1/24. was told to call back in a couple of week regarding new medication . pt b/p last night 203/77. 2/2 201/78 . 2/7 194/82. sided effect from meds. mouth like cotton. inform pt that i would send an urgent message to his nurse.  Initial call taken by: Neil Crouch,  December 18, 2009 8:41 AM  Follow-up for Phone Call        Pt feels very tired and sleepy after taking her med.  Also BP is remaining high and new med doesn't seem to be helping.  Pt having dry mouth also and would like to have med changed if possible.  Appt given for today at 1:45 with Dr Burt Knack. Follow-up by: Sim Boast, RN,  December 18, 2009 8:54 AM

## 2010-12-11 NOTE — Assessment & Plan Note (Signed)
Summary: dizziness/njr   Vital Signs:  Patient profile:   77 year old female Menstrual status:  postmenopausal Weight:      164 pounds Temp:     98.3 degrees F oral Pulse rate:   56 / minute Pulse rhythm:   regular BP sitting:   160 / 60  (left arm) Cuff size:   regular  Vitals Entered By: Chipper Oman, RN (July 04, 2010 11:19 AM) CC: C/o dizzy since Monday.   History of Present Illness: Here for 3 days of mild dizziness which she describes as the rooom spinning around her when she moves her head suddenly. No chest pain or SOB or HAs. Her Bp is still labile, averaging from Q000111Q systolic at home.   Allergies: 1)  ! Penicillin V Potassium (Penicillin V Potassium) 2)  Amoxicillin (Amoxicillin) 3)  Codeine Phosphate (Codeine Phosphate)  Past History:  Past Medical History: Hyperlipidemia Hypertension, sees Dr. Percival Spanish and Dr. Christell Faith at [facility] Hypothyroidism Osteoarthritis Anemia-NOS Asthma Renal insufficiency, sees Dr. [name] Renal doppler US 03-17-08 showed cortical thinning but negative for renal artery stenosis Anxiety Irritable Bowel Syndrome Gout Adenomatous Colon Polyps 06/2001 Tubulovillous Adenoma 02/2006 Hemorrhoids Obesity Diverticulosis Carotid Bruits (non obstructive plaque)  Past Surgical History: Reviewed history from 07/13/2009 and no changes required. Lumbar disk surgery 2000 Squamous cell skin cancers removed, per Dr. Sarajane Jews Oophorectomy Rt Appendectomy Low anterior resection Dr. Zella Richer 7/ 2008 Cataract Extraction Colonoscopy 02-20-09 per Dr. Fuller Plan, repeat in 3 yrs  Review of Systems  The patient denies anorexia, fever, weight loss, weight gain, vision loss, decreased hearing, hoarseness, chest pain, syncope, dyspnea on exertion, peripheral edema, prolonged cough, headaches, hemoptysis, abdominal pain, melena, hematochezia, severe indigestion/heartburn, hematuria, incontinence, genital sores, muscle weakness,  suspicious skin lesions, transient blindness, difficulty walking, depression, unusual weight change, abnormal bleeding, enlarged lymph nodes, angioedema, breast masses, and testicular masses.    Physical Exam  General:  Well-developed,well-nourished,in no acute distress; alert,appropriate and cooperative throughout examination Head:  Normocephalic and atraumatic without obvious abnormalities. No apparent alopecia or balding. Eyes:  No corneal or conjunctival inflammation noted. EOMI. Perrla. Funduscopic exam benign, without hemorrhages, exudates or papilledema. Vision grossly normal. Ears:  External ear exam shows no significant lesions or deformities.  Otoscopic examination reveals clear canals, tympanic membranes are intact bilaterally without bulging, retraction, inflammation or discharge. Hearing is grossly normal bilaterally. Nose:  External nasal examination shows no deformity or inflammation. Nasal mucosa are pink and moist without lesions or exudates. Mouth:  Oral mucosa and oropharynx without lesions or exudates.  Teeth in good repair. Neck:  No deformities, masses, or tenderness noted. Lungs:  Normal respiratory effort, chest expands symmetrically. Lungs are clear to auscultation, no crackles or wheezes. Heart:  Normal rate and regular rhythm. S1 and S2 normal without gallop, murmur, click, rub or other extra sounds. Neurologic:  alert & oriented X3, cranial nerves II-XII intact, and gait normal.     Impression & Recommendations:  Problem # 1:  BENIGN POSITIONAL VERTIGO (ICD-386.11)  Her updated medication list for this problem includes:    Meclizine Hcl 25 Mg Tabs (Meclizine hcl) .Marland Kitchen... 1 q 4 hours as needed dizziness  Problem # 2:  ESSENTIAL HYPERTENSION, BENIGN (ICD-401.1)  Her updated medication list for this problem includes:    Amlodipine Besylate 5 Mg Tabs (Amlodipine besylate) .Marland Kitchen... Take 1 1/2 tablets by mouth once daily    Clonidine Hcl 0.1 Mg Tabs (Clonidine hcl) .Marland Kitchen...  Take one tablet by mouth twice a day  Chlorthalidone 25 Mg Tabs (Chlorthalidone) .Marland Kitchen... Take 1 tablet by mouth once a day    Micardis 40 Mg Tabs (Telmisartan) .Marland Kitchen... Take 1 tablet by mouth once a day  Problem # 3:  DIZZINESS (ICD-780.4)  Her updated medication list for this problem includes:    Meclizine Hcl 25 Mg Tabs (Meclizine hcl) .Marland Kitchen... 1 q 4 hours as needed dizziness  Complete Medication List: 1)  Aspirin Ec Lo-dose 81 Mg Tbec (Aspirin) .... Take 1 tablet by mouth every morning 2)  Amlodipine Besylate 5 Mg Tabs (Amlodipine besylate) .... Take 1 1/2 tablets by mouth once daily 3)  Synthroid 88 Mcg Tabs (Levothyroxine sodium) .Marland Kitchen.. 1 by mouth once daily 4)  Temazepam 30 Mg Caps (Temazepam) .... At bedtime as needed 5)  Gas-x 80 Mg Chew (Simethicone) .... Daily 6)  Align Caps (Probiotic product) .... As needed 7)  Clonidine Hcl 0.1 Mg Tabs (Clonidine hcl) .... Take one tablet by mouth twice a day 8)  Chlorthalidone 25 Mg Tabs (Chlorthalidone) .... Take 1 tablet by mouth once a day 9)  Micardis 40 Mg Tabs (Telmisartan) .... Take 1 tablet by mouth once a day 10)  Hyoscyamine Sulfate 0.125 Mg Tabs (Hyoscyamine sulfate) .Marland Kitchen.. 1-2 tablets by mouth before meals as needed 11)  Meclizine Hcl 25 Mg Tabs (Meclizine hcl) .Marland Kitchen.. 1 q 4 hours as needed dizziness  Patient Instructions: 1)  Rest, stay out of the heat, drink fluids. use Meclizine as needed  Prescriptions: TEMAZEPAM 30 MG  CAPS (TEMAZEPAM) at bedtime as needed  #30 x 5   Entered and Authorized by:   [name] MD   Signed by:   [name] MD on 07/04/2010   Method used:   Print then Give to Patient   RxID:   [id number] MECLIZINE HCL 25 MG TABS (MECLIZINE HCL) 1 q 4 hours as needed dizziness  #60 x 5   Entered and Authorized by:   [name] MD   Signed by:   [name] MD on 07/04/2010   Method used:   Electronically to        [facility] [phone]* (retail)       [address]       [place], [place]  [address]        Ph: [phone] or [phone]       Fax: [phone]   RxID:   [id number]

## 2010-12-13 NOTE — Letter (Signed)
Summary: [facility]   Imported By: Laural Benes 11/08/2010 10:57:05  _____________________________________________________________________  External Attachment:    Type:   Image     Comment:   External Document

## 2011-01-04 ENCOUNTER — Telehealth: Payer: Self-pay | Admitting: Family Medicine

## 2011-01-04 MED ORDER — TEMAZEPAM 30 MG PO CAPS: 30.0000 mg | ORAL_CAPSULE | Freq: Every evening | ORAL | Status: DC | PRN

## 2011-01-04 NOTE — Telephone Encounter (Signed)
Call in Temazepam 30 mg qhs, #30 with 5 rf. She may use Mucinex OTC for congestion or Delsym for cough

## 2011-01-04 NOTE — Telephone Encounter (Signed)
Pt notified rx called in.

## 2011-01-04 NOTE — Telephone Encounter (Signed)
Triage vm----she has a drainage in her throat with a cough. Wants recommendation of what to take otc, to help her. She is on bp med. Also, she will need a rx for  Temazepam to be [facility] to [facility]. Her cell #----[phone].

## 2011-02-07 ENCOUNTER — Other Ambulatory Visit: Payer: Self-pay | Admitting: Family Medicine

## 2011-03-26 NOTE — Discharge Summary (Signed)
NAMEDAZIE, SUHRE                ACCOUNT NO.:  [id number]   MEDICAL RECORD NO.:  [id number]          PATIENT TYPE:  INP   LOCATION:  1525                         FACILITY:  [facility]   PHYSICIAN:  Odis Hollingshead, M.D.DATE OF BIRTH:  [birth year]   DATE OF ADMISSION:  05/19/2007  DATE OF DISCHARGE:  05/25/2007                               DISCHARGE SUMMARY   PRINCIPLE DISCHARGE DIAGNOSIS:  Tubulovillous adenoma of the right  sigmoid junction.   SECONDARY DIAGNOSES:  1. Hypertension.  2. Acute blood loss anemia.  3. Hyperkalemia.  4. Postoperative ileus.  5. Acute left great toe gout.   PROCEDURE:  Diagnostic laparoscopy, then low anterior resection of the  rectal/sigmoid junction.   INDICATIONS:  An 77 year old female who has had a polyp around the  sigmoid colon region, was biopsied and was benign but the polyp is  persistent and repeat colonoscopy demonstrated a 30 mm polyp that is  tubulovillous in nature but cannot be removed in its entirety.  She  subsequently was admitted for the above procedure.   HOSPITAL COURSE:  She underwent the above operation.  She had a little  bit of a reaction to the Dilaudid, causing her to be a little shaky, so  this was changed to morphine.  She did have acute blood loss anemia and  some hyperkalemia.  IV fluids were adjusted appropriately.  Her nadir  hemoglobin was approximately 8.3 but she tolerated this well and was  asymptomatic.  She had acute episode of gouty arthritis and was placed  on colchicine by Dr. Wynelle Link.  Her ileus resolved and she is going to be  placed on full liquid diet.  She did have some hypokalemia with  potassium of 3.1.  This was treated orally.  By May 25, 2007, she was  tolerating a diet.  Her gout attack had resolved, her bowels were  moving.  Hemoglobin was 8.1 at this time.  She was able to be  discharged.   DISPOSITION:  Discharge to home in satisfactory condition May 25, 2007.  She will be discharged on  potassium, iron, colchicine as needed for gout  as well as her home medications.  She was given discharge instructions.  She will see me back in the office in 2 weeks.      Odis Hollingshead, M.D.  Electronically Signed     TJR/MEDQ  D:  06/11/2007  T:  06/12/2007  Job:  [id number]   cc:   Clarene Reamer, MD  [address] [address]   Stephen A. Sarajane Jews, [place]  [place] [address]   Gaynelle Arabian, M.D.  Fax: [phone]

## 2011-03-26 NOTE — Op Note (Signed)
[name], JOKI NO.:  [id number]   MEDICAL RECORD NO.:  [id number]          PATIENT TYPE:  INP   LOCATION:  0008                         FACILITY:  [facility]   PHYSICIAN:  Odis Hollingshead, M.D.DATE OF BIRTH:  01-21-1924   DATE OF PROCEDURE:  05/19/2007  DATE OF DISCHARGE:                               OPERATIVE REPORT   PREOPERATIVE DIAGNOSIS:  Sigmoid colon polypoid mass.   POSTOPERATIVE DIAGNOSIS:  Rectosigmoid colon polypoid mass.   PROCEDURES:  1. Diagnostic laparoscopy.  2. Low anterior resection.   SURGEON:  Odis Hollingshead, M.D.   ASSISTANT:  Kathrin Penner, M.D.   ANESTHESIA:  General.   INDICATION:  Ms. Sewall is an 77 year old female who had a colonoscopy  over a year ago and had a large sigmoid polyp which was biopsied and was  benign.  Followup colonoscopy was performed about a year later and the  polyp had enlarged and was not able to be completely removed.  The polyp  was a tubulovillous-type adenoma.  Because of the inability to remove it  by way of colonoscopy, she was sent over to discuss partial colectomy  and now presents for that.   TECHNIQUE:  She was seen in the holding area, then brought to the  operating room and placed supine on the operating table and a general  anesthetic was administered.  She was placed in the lithotomy position  and a Foley catheter was inserted.  I performed proctosigmoidoscopy, but  it was difficult to visualize anything, given the amount of liquid stool  present.  The polyp also had been tattooed, but I could not appreciate  this, again given the amount of liquid stool present.  The abdominal  wall and perineal area were sterilely prepped and draped.  A small  incision was made in the supraumbilical region through the skin,  subcutaneous tissue, fascia and the peritoneal cavity was entered.  A  Hasson trocar was introduced into the peritoneal cavity and a  pneumoperitoneum was created by  insufflation of CO2 gas.  I then  introduced the laparoscope briefly and saw significant adhesions at the  lower abdominal area of omentum and large bowel.  I could not find a  good open area.  I went ahead put an 11-mm trocar in to the right mid  lateral abdomen, but again found significant adhesions.  I was able to  identify the sigmoid colon, but could not identify the tattoo mark.  The  transverse colon was adhered to the anterior abdominal wall very  significantly.  I decided to abort the laparoscopic procedure and  proceed with an open procedure.   I began with a supraumbilical incision and made and extended it into the  periumbilical area and down to the previous lower midline incision,  dividing the skin and subcutaneous tissue and fascia.  I carefully  dissected the transverse colon and omentum free from the midline  allowing me to extend the fascial incision.  Using electrocautery and  sharp dissection, I performed adhesiolysis, freeing up the transverse  colon from the midline abdominal wall as well  as the omentum.  I was  then able to mobilize the omentum proximally.  I inspected the sigmoid  colon and saw a tattoo marking at the peritoneal reflection at the  rectosigmoid junction region.  I was then able to palpate an irregular  mass distal to this.  This was more at the level of the proximal rectum  and the distal sigmoid colon.   I mobilized the sigmoid colon and the descending colon by dividing the  lateral attachments.  I carried this all the way down to the  rectosigmoid region.  The left ureter was identified and the plane of  dissection kept anterior to it.  I then made an incision.  in the  peritoneum medially and in the sigmoid mesocolon area toward the pelvis,  staying in the midline and away from the right ureter.  Using the  stapling device, I divided the colon distal to the tattooing in the  rectum area from approximately mid rectum.  I then divided the  sigmoid-  rectosigmoid junction and had plenty of redundant colon to stretch down  into the pelvis.  I divided the mesenteric vessels using the LigaSure  and ligated with the IMA.  I then took the specimen off the field and  opened it up on the back table.  It appeared that the polypoid mass was  just right at the staple line.  Subsequently, I immobilized more of the  rectum and using the stapler, I resected 2 more centimeters of rectum  and opened this up and no further polypoid area was noted.  I then  irrigated out the wound and controlled bleeding points with  electrocautery.   At this point, I then brought a size 29 EEA stapler to the field.  I  placed the anvil in the descending colon stump and had it exit out the  side in a Baker-type fashion and put a pursestring suture of 2-0 Prolene  around the edges, tightening the serosa of the colon around to the  anvil.  The handle of the stapler was then introduced to the rectum and  a stapled anastomosis was performed.  Two intact tissue donuts were  noted and the rectal tissue donut was sent to pathology as the final  distal margin.  I then placed the anastomosis under water and occluded  the colon proximal to it.  Air was injected and the staple line  inspected under vision and it was intact without evidence of leak.  The  gas was removed.  This anastomosis was patent by water and under no  tension.   I irrigated out the pelvis.  Hemostasis was adequate and irrigation  fluid was clear.  Before this, the gloves had been changed.  I then  requested needle, sponges and instrument count and this was reported to  be correct.  At this time, the midline fascia was closed with a running  #1 PDS suture.  Subcutaneous tissue was irrigated and the midline  incision as well as a small right mid abdominal trocar site was closed  with staples and sterile dressings were applied.   She tolerated the procedure well without any apparent complications  and  was taken to the recovery room in satisfactory condition.      Odis Hollingshead, M.D.  Electronically Signed     TJR/MEDQ  D:  05/19/2007  T:  05/20/2007  Job:  [id number]   cc:   Ishmael Holter. Sarajane Jews, [name]  [place]  [place] [address]

## 2011-03-26 NOTE — Assessment & Plan Note (Signed)
[name]                            CARDIOLOGY OFFICE NOTE   NAME:[name], [name]                       MRN:          [id number]  DATE:03/28/2008                            DOB:          May 21, 1924    PRIMARY:  Dr. Shanon Scott.   REASON FOR PRESENTATION:  Evaluate patient with hypertension.   HISTORY OF PRESENT ILLNESS:  The patient presents for follow-up of her  high blood pressure.  She has had this for a while and was extensively  evaluated in 2002.  She has actually seen Dr. Florene Scott recently and had  renal ultrasounds, though the results were pending.  Her last creatinine  that I have is 1.4.  She has had blood pressures somewhat difficult to  control.  Actually, Dr. Florene Scott had suggested that she increase her  Cardizem to 240 mg daily.  The patient has heart rates in the 40s, and  so she called this office and we suggested that she not do this.  She  actually has been keeping a blood pressure diary.  Except for 1-day  earlier this month when her blood pressure was in the 180s to 170s  sustained, for the most part her blood pressure is better controlled.  The average is probably in the 120s to 130s with lows in the [id number] range  systolic, though occasionally will still go up to 140s or low 150s.  This is better than her previous diary.  She is not having any  presyncope or syncope.  She is not having any headaches.  She has had no  blurred vision.  She has had no chest discomfort, neck or arm  discomfort.  She has had no shortness of breath, PND or orthopnea.   PAST MEDICAL HISTORY:  1. Hypertension.  2. Neutropenia with unclear etiology.  3. Colonic polyps.  4. Infection of the right eye.  5. Hypothyroidism.  6. Irritable bowel syndrome.  7. Right sigmoid colonic polypoid mass, status post laparoscopy and      low anterior resection.  8. Oophorectomy.  9. Back surgery.  10.Eye surgery.   ALLERGIES/INTOLERANCES:  PENICILLIN AND  CODEINE.   MEDICATIONS:  1. Diltiazem 120 mg daily.  2. Lisinopril/HCT 20/12.5 b.i.d.  3. Synthroid 88 mcg daily.  4. Aspirin 81 mg daily.  5. Temazepam 30 mg nightly.  6. Doxazosin 1 mg daily.   REVIEW OF SYSTEMS:  As stated in the HPI and otherwise negative for  other systems.   PHYSICAL EXAMINATION:  GENERAL:  The patient is in no distress.  VITAL SIGNS:  Blood pressure 182/59, heart rate 57 and irregular, weight  170 pounds.  HEENT:  Eyelids unremarkable, anisocoric with right pupil misshapened,  dilated.  Fundi not visualized.  Oral mucosa unremarkable.  NECK:  No jugulovenous distention at 45 degrees.  Carotid upstroke brisk  and symmetric.  Bilateral carotid bruits, no thyromegaly.  LYMPHATICS:  No cervical, axillary or inguinal adenopathy.  LUNGS:  Clear to auscultation bilaterally.  BACK:  No costovertebral angle tenderness.  CHEST:  Unremarkable.  HEART:  PMI not displaced or sustained.  S1-S2  within normal limits.  No  S3-S4.  No clicks, no rubs, no murmurs.  ABDOMEN:  Flat, positive bowel sounds.  Normal in frequency and pitch.  No bruits, no rebound, no guarding, no midline pulsatile mass.  No  hepatomegaly or splenomegaly.  SKIN:  No rashes, no nodules.  EXTREMITIES:  2+ pulses throughout.  No cyanosis, no clubbing, no edema.  NEURO:  Oriented to person, place and time.  Cranial nerves II-XII  grossly intact.  Motor grossly intact.   EKG - sinus rhythm, rate 57, axis within normal limits, intervals within  normal limits, no acute ST wave change.   ASSESSMENT/PLAN:  1. Hypertension; blood pressures are better controlled according to      her blood pressure diary.  There is a definite change since the      doxazosin was started.  I think she would be too bradycardic with      an increase in her diltiazem.  Therefore, she is going to continue      on the medications as listed.  There is probably a component of      white coat hypertension here.  She will keep a  blood pressure diary      and let me know if it is trending upward, and then we will make      further changes.  She has had an extensive workup for secondary      causes in the past that have been negative.  She has seen Dr.      Florene Scott and has had a renal ultrasound recently.  2. Renal insufficiency; as above.  3. Carotid bruits; she has bilateral carotid bruits and has not had a      Doppler since 2002, when there was no disease.  We will go ahead      and get a carotid Doppler again.  4. Chest pain; she has had no further chest pain.  No further workup      is planned.   FOLLOW UP:  I will see the patient back in about 3 months or sooner if  she is having more blood pressure issues.     Lindsey Breeding, MD, [facility]  Electronically Signed   JH/MedQ  DD: 03/28/2008  DT: 03/28/2008  Job #: [id number]   cc:   [name]. Lindsey Bill, MD

## 2011-03-26 NOTE — Assessment & Plan Note (Signed)
[facility]                         GASTROENTEROLOGY OFFICE NOTE   NAME:[name], [name]                       MRN:          [id number]  DATE:12/03/2007                            DOB:          1924-11-10    The patient is an 77 year old white female who is a former patient of  Clarene Reamer, M.D.  I saw her briefly during a flexible  sigmoidoscopy performed by Dr. Velora Heckler in April 2008.  She had a  tubulovillous adenoma in the sigmoid colon that could not be removed  endoscopically and she was referred to Odis Hollingshead, M.D. and  underwent a low anterior resection with successful removal of the polyp.  She has had ongoing problems with alternating diarrhea and constipation  for years and carries a diagnosis of irritable bowel syndrome.  She  notes that her constipation has been more problematic over the past few  months.  She has also noted worsening problems with gas, bloating, and  flatulence.  Her appetite is good, her weight is stable.  She no change  in stool caliber, melena, hematochezia, or weight loss.   CURRENT MEDICATIONS:  Listed on the chart, updated and reviewed.   ALLERGIES:  PENICILLIN.   PHYSICAL EXAMINATION:  GENERAL:  No acute distress.  VITAL SIGNS:  Weight 167.6, blood pressure 156/62, pulse 64 and regular.  CHEST:  Clear to auscultation bilaterally.  HEART:  Regular rate and rhythm without murmurs appreciated.  ABDOMEN:  Soft and nontender with normal active bowel sounds.  No  palpable organomegaly, masses, or hernias.   ASSESSMENT:  1. Irritable bowel syndrome with alternating diarrhea and      constipation.  Constipation has recently worsened.  She is to      increase her fluid and fiber intake, begin MiraLax and she will      titrate the dose between once a day and four times a day as needed      for adequate bowel movements.  Begin Xifaxan 400 mg p.o. t.i.d. for      10 days.  Begin a low gas diet and Gas-X  q.i.d. p.r.n.  Return      office visit in 4-6 weeks.  2. Personal history of tubulovillous adenoma, status post low anterior      sigmoid colon resection in July of 2008.  Surveillance colonoscopy      recommended for April of 2010.     [name], MD, [facility]  Electronically Signed    MTS/MedQ  DD: 12/03/2007  DT: 12/03/2007  Job #: [id number]

## 2011-03-26 NOTE — Assessment & Plan Note (Signed)
[facility]                            CARDIOLOGY OFFICE NOTE   NAME:[name], [name]                       MRN:          [id number]  DATE:05/05/2007                            DOB:          20-Apr-1924    PRIMARY CARE PHYSICIAN:  Ishmael Holter. Sarajane Jews, M.D.   REASON FOR PRESENTATION:  Patient refers herself for preoperative  evaluation prior to having colon polyps surgically resected.  She also  has a history of hypertension, for which she is to see Dr. Dannielle Burn.  She  has not seen him since 2006.   HISTORY OF PRESENT ILLNESS:  The patient is a pleasant 77 year old white  female with a longstanding history of hypertension.  She was worked up  in the past for difficult to control hypertension and secondary causes.  She was managed by Dr. Dannielle Burn.  He has not seen her since 2006.  She had  been managed on Diltiazem and lisinopril.  As I look at the previous  office visits, her blood pressure seemed to be controlled over the last  several visits on this regimen.  However, she ran out of the Diltiazem  and did not take it for the last couple of days.  Her blood pressure is  elevated, as listed below.   She is followed for colon polyps, and there is one that cannot be  resected via colonoscopy.  Therefore, she is due to have resection by  Dr. Zella Richer.  She wanted to be seen prior to this, as she had not  seen a cardiologist in a while.   Prior to this, her evaluation did include a monitor for palpitations.  This is unrevealing.  She had carotid ultrasounds demonstrating no  significant stenosis.  She had an echocardiogram in 2005 demonstrating  no significant abnormalities.  She has never had any stress testing.  She remains quite active.  She walks several days a week at a church  gym.  She gets her heart rate up and says that she does not have any  chest discomfort, neck or arm discomfort.  She is not having any  palpitations, presyncope, or syncope.  She  has no PND or orthopnea.  She  does not take her blood pressure at home.   PAST MEDICAL HISTORY:  1. Hypertension.  2. Neutropenia of unclear etiology.  3. Colon polyps.  4. Infection in her right eye.  5. Hypothyroidism.   PAST SURGICAL HISTORY:  1. Right oophorectomy in 1962.  2. Back surgery.  3. Right eye surgery.   ALLERGIES:  PENICILLIN, CODEINE.   MEDICATIONS:  1. Synthroid.  2. Aspirin 81 mg daily.  3. Temazepam 30 mg daily.  4. Diltiazem 120 mg daily.  5. Lisinopril/HCT 20/12.5 daily.   SOCIAL HISTORY:  The patient is retired.  She does not smoke and does  not drink alcohol.  She has had two children, one deceased.  She has one  grandchild.   FAMILY HISTORY:  Contributory for her father dying of myocardial  infarction at age 75.  She has a brother who has heart problems, but she  does not know the details.   REVIEW OF SYSTEMS:  As stated in the HPI.  Negative for all other  systems.   PHYSICAL EXAMINATION:  GENERAL:  Patient is in no distress.  VITAL SIGNS:  Blood pressure 186/80, heart rate 70 and regular, weight  176 pounds.  Body Mass Index 39.  HEENT:  Eyelids unremarkable.  Right anisocoria, left pupil reactive and  round.  Fundi not visualized.  Oral mucosa unremarkable.  NECK:  No jugular venous distention at 45 degrees.  Carotid upstroke  brisk and symmetric.  A soft, left greater than right carotid bruit, no  thyromegaly.  LYMPHATICS:  No cervical, axillary, or inguinal adenopathy.  LUNGS:  Clear to auscultation bilaterally.  BACK:  No costovertebral angle tenderness.  CHEST:  Unremarkable.  HEART:  PMI not displaced or sustained.  S1 and S2 within normal limits.  No S3, no S4, no clicks, rubs, or murmurs.  ABDOMEN:  Flat, positive bowel sounds.  Normal in frequency and pitch.  No bruits, rebound, guarding.  There are no midline pulsatile masses.  No hepatomegaly, splenomegaly.  SKIN:  No rashes, no nodules.  EXTREMITIES:  Pulses 2+.  No  cyanosis, no clubbing, no edema.  NEUROLOGIC:  She has a slight facial droop, otherwise cranial nerves  intact.  Motor grossly intact.   EKG:  Sinus rhythm, rate 61, axis within normal limits, intervals within  normal limits.  No acute ST-T wave changes.   ASSESSMENT/PLAN:  1. Hypertension:  The patient's blood pressure has been controlled on      the combination of lisinopril/HCT.  I am going to refill this.  She      is going to come back in a few days for blood pressure check.  If      her blood pressure is not controlled, I would increase the      lisinopril/HCT.  She has had a workup for secondary causes.  None      has been identified.  No further evaluation is warranted.  I have      advised her to get a blood pressure cuff and keep a blood pressure      diary.  2. Preoperative:  The patient would be at acceptable risk of the      planned procedure without      further cardiovascular testing.  3. Followup:  She can come back to this clinic in 18 months or as      needed.     Minus Breeding, MD, [facility]  Electronically Signed    JH/MedQ  DD: 05/05/2007  DT: 05/05/2007  Job #: [id number]   cc:   Odis Hollingshead, M.D.  Ishmael Holter. Sarajane Jews, MD

## 2011-03-26 NOTE — Assessment & Plan Note (Signed)
[facility]                            CARDIOLOGY OFFICE NOTE   NAME:[name], [name]                       MRN:          [id number]  DATE:12/26/2008                            DOB:          July 30, 1924    PRIMARY CARE PHYSICIAN:  Ishmael Holter. Sarajane Jews, MD   REASON FOR PRESENTATION:  Evaluate the patient with hypertension.   HISTORY OF PRESENT ILLNESS:  The patient called to add herself on to the  schedule.  She has been having increased blood pressures.  She has been  checking it frequently.  She was not sure whether her blood pressure  cuff was accurate so she checked it with friends and it was even higher.  She brings her blood pressure diary today that demonstrates systolic  blood pressures in the 170s or 180s.  The diastolics are always well  controlled.  The heart rates is in the 50s often.  She says she has been  having some headaches, but does not know whether it correlates with the  blood pressures being elevated.  She has not had any new chest  discomfort, neck or arm discomfort.  She has not had any palpitation,  presyncope, or syncope.  She has had no PND or orthopnea.   Of note, the patient has had episodes of feeling anxious to very  tense.  She does not report any depressive symptoms.  She has not  talked to Dr. Sarajane Jews about this.   PAST MEDICAL HISTORY:  1. Hypertension.  2. Neutropenia with unclear etiology.  3. Colonic polyps.  4. Infection of the right eye.  5. Hypothyroidism.  6. Irritable bowel syndrome.  7. Gout.  8. Right sigmoid colonic polyp mass status post laparoscopy and low      anterior resection.  9. Oophorectomy.  10.Back surgery.  11.Eye surgery.   ALLERGIES:  The patient is intolerant to PENICILLIN and CODEINE.   MEDICATIONS:  1. Diltiazem 120 mg daily.  2. Lisinopril 20/12.5 b.i.d.  3. Synthroid 88 mcg daily.  4. Aspirin 81 mg daily.  5. Temazepam 30 mg daily nightly.  6. Doxazosin 2 mg daily.  7.  PreserVision.   REVIEW OF SYSTEMS:  As stated in the HPI and otherwise, negative for all  other systems.   PHYSICAL EXAMINATION:  GENERAL:  The patient is in no distress.  VITAL SIGNS:  Blood pressure 164/64, heart rate 64 and regular, weight  170 pounds, and body mass index 38.  HEENT:  Eyelids unremarkable, the right pupil is misshapen and  nonreactive, the left is normally reactive, the fundi are not  visualized, oral mucosa unremarkable.  NECK:  No jugular venous distention at 45 degrees; carotid upstroke  brisk and symmetric, bilateral soft carotid bruits, no thyromegaly.  LYMPHATICS:  No cervical, axillary, or inguinal adenopathy.  LUNGS:  Clear to auscultation bilaterally.  BACK:  No costovertebral angle tenderness.  CHEST:  Unremarkable.  HEART:  PMI not displaced or sustained; S1 and S2 within normal limits;  no S3; no S4, no clicks, no rubs, no murmurs.  ABDOMEN:  Flat, positive bowel sounds, normal in  frequency and pitch; no  bruits, no rebound, no guarding, no midline pulsatile mass, no  hepatomegaly, no splenomegaly.  SKIN:  No rashes, no nodules.  EXTREMITIES:  Pulses 2+ throughout, no edema, no cyanosis, no clubbing.  NEURO:  Oriented to person, place, and time, cranial nerves II through  XII grossly intact, motor grossly intact.   EKG sinus rhythm, rate 57, axis within normal limits, intervals within  normal limits, no acute ST wave changes.   ASSESSMENT AND PLAN:  1. Hypertension.  The blood pressure seems to somewhat elevated      compared to where we were in the summer of last year.  At this      point, I am not going to increase the diltiazem because of her      bradycardia.  I do not think we get much improvement by going up on      lisinopril.  Therefore, I am going to increase the doxazosin to 1      pill daily.  Also, I would like her address her anxiety as      described below.  She will keep her blood pressure diary and then I      will her see back to  evaluate this further.  2. Anxiety.  I am going to sent a note to Dr. Sarajane Jews asking him to      evaluate and manage this as I think this is contributing.  3. Hypothyroidism.  She had a TSH checked in November and it was      within normal limits.  4. Carotid bruits.  She is due to have Dopplers done for less than 40%      bilateral stenosis.  This is due in May.  5. Dyslipidemia.  The patient has had some mild dyslipidemia in the      past, but has tolerated, no statins.  Therefore, she needs      continued medical management.  Unfortunately, her LDL has been 144.      HDL has been 44.  We discussed diet and exercise as her      alternatives.  She would not want to take Niaspan.  I do not think      fibric acid would work.  We could      consider Zetia, but I will refer to Dr. Sarajane Jews.  6. Followup.  I will see her back in 1 month or sooner if needed.     Minus Breeding, MD, [facility]  Electronically Signed    JH/MedQ  DD: 12/26/2008  DT: 12/27/2008  Job #: [id number]   cc:   Annie Main A. Sarajane Jews, MD

## 2011-03-26 NOTE — Assessment & Plan Note (Signed)
[facility]                            CARDIOLOGY OFFICE NOTE   NAME:[name], [name]                       MRN:          [id number]  DATE:06/28/2008                            DOB:          07/03/1924    PRIMARY:  Standley Brooking. Panosh, MD   REASON FOR PRESENTATION:  Evaluate the patient with hypertension.   HISTORY OF PRESENT ILLNESS:  The patient returns for followup of her  blood pressure.  She brings me a diary.  She had random sampling.  There  had been some rare blood pressures with systolics in the [id number], but per  the most part, the systolics are well controlled in below 140.  The  diastolics are typically not elevated.  It fluctuates slightly without a  specific pattern.  She is not having any symptoms associated with any  other lows or the highs.  She has had medical problems with gout, she  had a squamous cell resected from her scalp.  She had probable stomach  virus.  She has had some chest soreness, but one of these episodes was  when she was at [facility] in [place].  She said it was really high and  crowded.  She denies any ongoing symptoms similar to that.  She has had  no chest pressure, neck or arm discomfort.  She has had no palpitation,  presyncope, or syncope.   PAST MEDICAL HISTORY:  1. Hypertension.  2. Neutropenia with unclear etiology.  3. Colonic polyps.  4. Infection of the right eye.  5. Hypothyroidism.  6. Irritable bowel syndrome.  7. Gout.  8. Right sigmoid colonic polypoid mass status post laparoscopy and low      anterior resection.  9. Oophorectomy.  10.Back surgery.  11.Eye surgery.   ALLERGIES:  Intolerance to PENICILLIN and CODEINE.   MEDICATIONS:  1. Diltiazem 120 mg daily.  2. Lisinopril 20/12.5 b.i.d.  3. Synthroid 80 mcg daily.  4. Aspirin 81 mg daily.  5. Temazepam 30 mg at bedtime.  6. Doxazosin 1 mg daily.   REVIEW OF SYSTEMS:  Positive for headaches, but negative for all other  systems.   PHYSICAL  EXAMINATION:  GENERAL:  The patient is in no distress.  VITAL SIGNS:  Blood pressure 140/70, heart rate 60 and regular.  HEENT:  Eyelids unremarkable, anisocoric right pupil, fundi not  visualized, oral mucosa normal.  NECK:  No jugular distention at 45 degrees and carotid upstroke, brisk  and symmetric, no bruits, no thyromegaly.  LYMPHATICS:  No adenopathy.  LUNGS:  Clear to auscultation bilaterally.  BACK:  No costovertebral angle history.  CHEST:  Unremarkable.  HEART:  PMI not displaced or sustained, S1-S2 within normals, no 0000000,  no clicks, rubs, or murmurs.  ABDOMEN:  Flat.  Positive bowel sounds.  Normal in frequency and pitch.  No bruits, rebound, or guarding.  No midline pulsatile mass or  organomegaly.  SKIN:  No rashes or nodules.  Healing resection of squamous cell on her  scalp.  EXTREMITIES:  2+ pulse throughout, there is some swelling of the left  foot where she is  recovering from gout, no edema, no clubbing.  NEURO:  Oriented to person, place, and time.  Cranial nerves II-XII  grossly intact.  Motor grossly intact.   EKG sinus rhythm, rate 60, axis within normals, intervals within normal  limits, no acute ST-wave changes.   ASSESSMENT AND PLAN:  1. Hypertension.  The patient's blood pressure is controlled.  She      does have some fluctuation, but very rare systolics above [id number].      Therefore, make no change during her medical regimen.  No actual      renal insufficiency.  This was followed earlier this year and her      creatinine was 1.3.  This can be followed by Dr. Regis Bill.  2. Carotid bruits.  She had Dopplers done earlier in the year and has      some mild plaquing 0-39% on the right and 40-59% on the left.  This      can be followed along with a 1-year Doppler.  3. Chest discomfort.  She has had some atypical chest discomfort under      extreme circumstances while at the [facility] in [place] during the      heat.  Otherwise, she is not having any symptoms.   I told her that      she needs to let me know if she has any recurrence of this, as she      has not had a stress test in the past.  I think the pretest      probability of coronary obstructive coronary disease as the      etiology based on the above symptoms in the absence of her ongoing      complaints is low.  4. Followup.  We will see her back in 1 year or sooner if needed.     Minus Breeding, MD, [facility]  Electronically Signed    JH/MedQ  DD: 06/28/2008  DT: 06/29/2008  Job #: [id number]   cc:   Standley Brooking. Regis Bill, MD

## 2011-03-26 NOTE — Assessment & Plan Note (Signed)
[place]                            CARDIOLOGY OFFICE NOTE   NAME:[name], [name]                       MRN:          [id number]  DATE:02/24/2008                            DOB:          1924/04/27    REFERRING PHYSICIAN:  Standley Brooking. Panosh, MD   REASON FOR EVALUATION:  Evaluate patient with hypertension.   HISTORY OF PRESENT ILLNESS:  The patient is 77 years old.  She has a  history of hypertension and was extensively evaluated in 2002 for  secondary causes.  She had been managed medically.  I last saw her in  June 2008 and her blood pressure was relatively well controlled.  However, she was dizzy on Sunday.  She did not described headache,  blurred vision, speech or motor problems.  However, she saw Dr. Regis Bill  and was noted to have significantly elevated blood pressures.  Her  systolic was [id number].  She was referred for further evaluation.   The patient denies any chest discomfort, neck or arm discomfort.  She  had no palpitations, presyncope or syncope.  She denies any PND or  orthopnea.   Of note, the patient has had some mild renal insufficiency recently,  though the most recent creatinine was 1.4.  She has had some mild  hyperkalemia as well.   PAST MEDICAL HISTORY:  1. Hypertension.  2. Neutropenia when clear etiology.  3. Colonic polyps.  4. Infection in the right eye.  5. Hypothyroidism.  6. Irritable bowel syndrome.  7. Right sigmoid colonic polypoid mass, status post laparoscopy and      low anterior resection.  8. Oophorectomy.  9. Back surgery.  10.Eye surgery.   ALLERGIES AND INTOLERANCES:  PENICILLIN and CODEINE.   MEDICATIONS:  1. Diltiazem 120 mg daily.  2. Lisinopril/HCT 20/12.5 b.i.d.  3. Synthroid 88 mcg daily.  4. Aspirin 81 mg daily.  5. Temazepam 30 mg nightly.   REVIEW OF SYSTEMS:  As stated in HPI and otherwise negative for other  systems.   PHYSICAL EXAMINATION:  The patient is in no distress.  Blood  pressure 213/83, heart rate 67 and regular, weight 168 pounds,  body mass index 38.  HEENT:  Anisocoric with the right pupil dilated, fundi not visualized.  Oral mucosa normal.  NECK:  No jugular distention at 45 degrees; carotid upstroke brisk and  symmetrical.  No bruits, no thyromegaly.  LYMPHATICS:  No adenopathy.  LUNGS:  Clear to auscultation bilaterally.  BACK:  No costovertebral history.  CHEST:  Unremarkable.  HEART:  PMI not displaced or sustained, S1 and S2 within normal.  No S3,  no S4, no clicks, no rubs, no murmurs.  ABDOMEN:  Flat; positive bowel  sounds, normal in frequency and pitch; no bruits, no rebound, no  guarding, no midline pulsatile mass, no organomegaly.  SKIN:  No rashes, no nodules.  EXTREMITIES:  Pulses 2+ throughout; no edema, no cyanosis, no clubbing.  NEUROLOGIC: Oriented to person, place and time.  Cranial nerves II-XII  grossly intact, motor grossly intact.   ASSESSMENT AND PLAN:  1. Hypertension.  Her blood pressure  has been somewhat labile.  Some      of this may be related to anxiety.  She has lot of anxiety related      to bowel problems.  This needs to be treated by her primary care      doctor.  She had an extensive workup of secondary causes with an      MRI and serum aldosterone being normal in 2002.  She did not      tolerate clonidine in the past as it dropped her pressure too much.      She will not tolerate increased diltiazem or addition of a beta      blocker, as she is already bradycardic.  I do not want to use      spironolactone with her renal insufficiency and hyperkalemia;      therefore, I am going to use Cardura starting with 1 mg daily.  She      will keep her blood pressure diary.  I will make further changes      based on these results.  Again, she needs to have her anxiety      treated.  2. Chest pain.  The patient has had chest pain in the past, but is no      longer complaining of this.  She has had negative cardiac  workup.      No further evaluation is warranted.  3. Renal insufficiency.  The patient is apparently going to see a      nephrologist in the near future for her mild renal insufficiency.      4.  Followup:  I will see her back in about 1 month, sooner if      needed.     Minus Breeding, MD, [facility]  Electronically Signed    JH/MedQ  DD: 02/24/2008  DT: 02/24/2008  Job #: [id number]

## 2011-03-29 NOTE — Assessment & Plan Note (Signed)
[facility]                           GASTROENTEROLOGY OFFICE NOTE   NAME:Copley, JANAS TRISKA                       MRN:          [id number]  DATE:08/11/2006                            DOB:          1924-02-24    [name] comes in on October 1 related to diarrhea and nausea.  She was seen  by triage.  She said that she does not have blood in the stool.  She does  have a lot of gas, especially in the afternoon, after dinner and after  eating.  She otherwise has been doing pretty good.  She ask me who she  should see as a cardiologist and  I suggested Dr. Dannielle Burn in [place].  I also  suggested Dr. Haroldine Laws.  She does have a history of somewhat difficult to  control hypertension, dyslipidemia, anemia, and lupus.  Her last  colonoscopic examination was in April 2007 and she had a polyp in her  descending colon which was removed __________ villous adenoma.  I suggested  because of this that she had a flexible sigmoid examination in a year and I  told her this that we need to do it next April.   PHYSICAL EXAMINATION:  She weighed 178.  Blood pressure 130/54, pulse 68 and  regular.  Neck appeared unremarkable.  ABDOMEN:  Soft, no masses,  organomegaly.   IMPRESSION:  1. Irritable bowel syndrome with diarrhea component and patient has had      known colon polyps.  2. Her hypertension with her dyslipidemia.  3. Mild obesity.   RECOMMENDATIONS:  Xifaxan 200 mg 1 b.i.d.  We gave her some Align and I told  her to be on lactose and sugar free diets and I have instructed her on  sucrose free-diets as well as lactose and hopefully this will help with her  gas.  She is to let us know if her condition does not improve.            ______________________________  Lindsey Reamer, MD      SML/MedQ  DD:  08/11/2006  DT:  08/12/2006  Job #:  [id number]

## 2011-03-29 NOTE — Assessment & Plan Note (Signed)
[facility]                            [place] OFFICE NOTE   NAME:[name], [name]                       MRN:          [id number]  DATE:01/12/2007                            DOB:          26-Oct-1924    This is an 77 year old woman here for a complete non-gynecological  physical examination.  In general, she has no particular complaints, but  is recovering from a fall which she sustained about 2 weeks ago while  walking at a gym with some friends.  She stumbled and fell forward,  landing flat on her abdomen.  She actually sustained some fractures of 3  ribs on the right side.  She saw Dr. Gladstone Lighter who diagnosed these.  He  also sent her for an abdominal CT scan to make sure there were no signs  of internal injury.  This came back normal.  Although she is still sore,  she seems to be recovering well.  She is using Darvocet as needed for  pain.  She does see Dr. Lyla Son on a regular basis for  GI care.  She has a history of some benign polyps.  She is due to another  colonoscopy next month.  She sees Dr. Ubaldo Glassing on a regular basis for  dermatology care.  She sees Dr. Marin Olp on a regular basis for anemia.  She sees Dr. Ree Edman on a regular basis for gynecology exams.  Of note,  the patient stopped taking Zocor 6 months ago due to muscle cramps.  These promptly resolved.  She is trying to keep her cholesterol under  control with dietary means only.  For other details of her past medical  history, family history, social history, habits, etc. I refer to her  introductory with her dated August 01, 2005.   ALLERGIES:  PENICILLIN.   CURRENT MEDICATIONS:  1. Cardia XT 120 mg per day.  2. Synthroid 88 mcg per day.  3. Aspirin 81 mg per day.  4. Lisinopril HCT 20/12.5 once a day.  5. Temazepam 30 mg q.h.s. as needed.   OBJECTIVE:  Height 5 feet 4 inches, weight 173, blood pressure 140/78,  pulse 68 and regular.  IN GENERAL:  She appears to be doing  quite well.  SKIN:  Clear.  EYES:  Clear, sclerae and nasopharynx clear.  NECK:  Without lymphadenopathy, masses.  LUNGS:  Clear.  CARDIAC:  Regular rate and rhythm, without murmurs, rubs or gallops.  Distal pulses are full.  EKG:  Within normal limits.  MUSCULOSKELETAL:  Right ribs are a bit tender on the right inferior  side, with no crepitus.  ABDOMEN:  Soft, normal bowel sounds, nontender, no masses.  EXTREMITIES:  No cyanosis, clubbing or edema.  NEUROLOGIC:  Grossly intact.   ASSESSMENT AND PLAN:  1. Complete physical.  She is fasting so will check her laboratories.  2. Hyperlipidemia.  Will check labs as above.  3. Hypertension, stable.  4. Recent rib fractures.  She will follow up with Dr. Gladstone Lighter.  5. Degenerative joint disease, stable.  6. Hypothyroidism.  Will check labs today.  7.  Insomnia.  Refilled Temazepam as above for the next 6 months.     Ishmael Holter. Sarajane Jews, MD  Electronically Signed    SAF/MedQ  DD: 01/12/2007  DT: 01/12/2007  Job #: [id number]

## 2011-04-11 ENCOUNTER — Telehealth: Payer: Self-pay | Admitting: *Deleted

## 2011-04-11 NOTE — Telephone Encounter (Signed)
Pt is asking for Rx for gout flare.  Going out of town and cannot come to the office.

## 2011-04-12 MED ORDER — PREDNISONE (PAK) 10 MG PO TABS: 10.0000 mg | ORAL_TABLET | Freq: Every day | ORAL | Status: AC

## 2011-04-12 NOTE — Telephone Encounter (Signed)
Call in a 6 day taper pack of Prednisone 10 mg

## 2011-04-25 ENCOUNTER — Other Ambulatory Visit: Payer: Self-pay | Admitting: Dermatology

## 2011-05-18 ENCOUNTER — Other Ambulatory Visit: Payer: Self-pay | Admitting: Family Medicine

## 2011-05-20 ENCOUNTER — Telehealth: Payer: Self-pay | Admitting: Family Medicine

## 2011-05-20 NOTE — Telephone Encounter (Signed)
Left message, script was approved and pt should schedule a office visit for future refills.

## 2011-07-10 ENCOUNTER — Telehealth: Payer: Self-pay | Admitting: Gastroenterology

## 2011-07-10 NOTE — Telephone Encounter (Signed)
Left message for patient to call back  

## 2011-07-11 ENCOUNTER — Encounter: Payer: Self-pay | Admitting: Family Medicine

## 2011-07-11 ENCOUNTER — Ambulatory Visit (INDEPENDENT_AMBULATORY_CARE_PROVIDER_SITE_OTHER): Payer: Medicare Other | Admitting: Family Medicine

## 2011-07-11 VITALS — BP 134/60 | HR 64 | Temp 98.3°F | Ht 64.0 in | Wt 166.0 lb

## 2011-07-11 DIAGNOSIS — E039 Hypothyroidism, unspecified: Secondary | ICD-10-CM

## 2011-07-11 DIAGNOSIS — I10 Essential (primary) hypertension: Secondary | ICD-10-CM

## 2011-07-11 DIAGNOSIS — G4700 Insomnia, unspecified: Secondary | ICD-10-CM

## 2011-07-11 LAB — HEPATIC FUNCTION PANEL
AST: 22 U/L (ref 0–37)
Albumin: 3.8 g/dL (ref 3.5–5.2)
Total Protein: 7.2 g/dL (ref 6.0–8.3)

## 2011-07-11 LAB — POCT URINALYSIS DIPSTICK
Bilirubin, UA: NEGATIVE
Nitrite, UA: NEGATIVE
Urobilinogen, UA: 0.2
pH, UA: 6.5

## 2011-07-11 LAB — BASIC METABOLIC PANEL
BUN: 42 mg/dL — ABNORMAL HIGH (ref 6–23)
CO2: 26 mEq/L (ref 19–32)
Calcium: 9.2 mg/dL (ref 8.4–10.5)
GFR: 31.88 mL/min — ABNORMAL LOW (ref >60.00)
Glucose, Bld: 100 mg/dL — ABNORMAL HIGH (ref 70–99)
Potassium: 5.6 mEq/L — ABNORMAL HIGH (ref 3.5–5.1)

## 2011-07-11 LAB — CBC WITH DIFFERENTIAL/PLATELET
Basophils Absolute: 0 10*3/uL (ref 0.0–0.1)
Basophils Relative: 0.6 % (ref 0.0–3.0)
Eosinophils Absolute: 0.1 10*3/uL (ref 0.0–0.7)
Lymphocytes Relative: 31.5 % (ref 12.0–46.0)
MCHC: 33.1 g/dL (ref 30.0–36.0)
MCV: 91.8 fl (ref 78.0–100.0)
Monocytes Absolute: 0.5 10*3/uL (ref 0.1–1.0)
Neutrophils Relative %: 52.8 % (ref 43.0–77.0)
Platelets: 179 10*3/uL (ref 150.0–400.0)
RBC: 3.46 Mil/uL — ABNORMAL LOW (ref 3.87–5.11)
RDW: 15 % — ABNORMAL HIGH (ref 11.5–14.6)

## 2011-07-11 MED ORDER — TEMAZEPAM 30 MG PO CAPS: 30.0000 mg | ORAL_CAPSULE | Freq: Every evening | ORAL | Status: DC | PRN

## 2011-07-11 MED ORDER — MECLIZINE HCL 25 MG PO TABS: 25.0000 mg | ORAL_TABLET | Freq: Three times a day (TID) | ORAL | Status: AC | PRN

## 2011-07-11 MED ORDER — LORAZEPAM 0.5 MG PO TABS: 0.5000 mg | ORAL_TABLET | Freq: Three times a day (TID) | ORAL | Status: DC | PRN

## 2011-07-11 MED ORDER — LEVOTHYROXINE SODIUM 88 MCG PO TABS: 88.0000 ug | ORAL_TABLET | Freq: Every day | ORAL | Status: DC

## 2011-07-11 NOTE — Progress Notes (Signed)
  Subjective:    Patient ID: [name], female    DOB: 07-30-1924, 77 y.o.   MRN: [id number]  HPI Here for a one year follow up and med refills. She is doing well in general. She stays active and works out at [employer] 3 days a week. She still sees her vascular surgeon in [facility] several times a year, and he takes care of all her cardiovascular medications.    Review of Systems  Constitutional: Negative.   Respiratory: Negative.   Cardiovascular: Negative.   Gastrointestinal: Negative.        Objective:   Physical Exam  Constitutional: She is oriented to person, place, and time. She appears well-developed and well-nourished.  Neck: No thyromegaly present.  Cardiovascular: Normal rate, regular rhythm, normal heart sounds and intact distal pulses.   Pulmonary/Chest: Effort normal and breath sounds normal.  Musculoskeletal: Normal range of motion. She exhibits no edema and no tenderness.  Lymphadenopathy:    She has no cervical adenopathy.  Neurological: She is alert and oriented to person, place, and time.          Assessment & Plan:  She seems to be quite stable. Get labs today. Refilled meds

## 2011-07-11 NOTE — Telephone Encounter (Signed)
Left message for patient to call back  

## 2011-07-11 NOTE — Telephone Encounter (Signed)
I have left a message for the patient that we will be happy to refill, but please call back and let us know what pharmacy she wants this to go to.

## 2011-07-11 NOTE — Telephone Encounter (Signed)
She is using her hyoscyamine ac meals and it works well for her abdominal pain.  She is using 4 a day and is asking for more than #60 that was Rxd is this ok?

## 2011-07-11 NOTE — Telephone Encounter (Signed)
Sure. Please refill with a quantity adequate to last a month and with refills

## 2011-07-12 MED ORDER — HYOSCYAMINE SULFATE 0.125 MG PO TABS: 0.1250 mg | ORAL_TABLET | Freq: Three times a day (TID) | ORAL | Status: DC

## 2011-07-12 NOTE — Telephone Encounter (Signed)
Sent the prescription to [facility] on [address].

## 2011-07-17 ENCOUNTER — Telehealth: Payer: Self-pay | Admitting: *Deleted

## 2011-07-17 NOTE — Telephone Encounter (Signed)
Had blood drawn and now has bruise from elbow to shoulder.  Wanted to be sure it came from the blood draw.

## 2011-07-18 NOTE — Telephone Encounter (Signed)
Notified pt. 

## 2011-07-18 NOTE — Telephone Encounter (Signed)
It certainly sounds like it is from the draw. Use ice packs to the area and rest. It should resolve over the next week or so

## 2011-07-29 ENCOUNTER — Encounter: Payer: Self-pay | Admitting: Family Medicine

## 2011-08-08 LAB — COMPREHENSIVE METABOLIC PANEL
BUN: 34 — ABNORMAL HIGH
Calcium: 8.9
Creatinine, Ser: 1.82 — ABNORMAL HIGH
GFR calc non Af Amer: 27 — ABNORMAL LOW
Glucose, Bld: 176 — ABNORMAL HIGH
Total Protein: 7

## 2011-08-08 LAB — DIFFERENTIAL
Eosinophils Absolute: 0.1
Lymphs Abs: 0.6 — ABNORMAL LOW
Monocytes Relative: 6
Neutro Abs: 8 — ABNORMAL HIGH
Neutrophils Relative %: 86 — ABNORMAL HIGH

## 2011-08-08 LAB — CBC
HCT: 33.7 — ABNORMAL LOW
Hemoglobin: 11.4 — ABNORMAL LOW
MCHC: 33.8
MCV: 91.4
RDW: 13.7

## 2011-08-08 LAB — URINALYSIS, ROUTINE W REFLEX MICROSCOPIC
Ketones, ur: NEGATIVE
Nitrite: NEGATIVE
Protein, ur: NEGATIVE
pH: 5

## 2011-08-08 LAB — URINE CULTURE

## 2011-08-08 LAB — URINE MICROSCOPIC-ADD ON

## 2011-08-08 LAB — LIPASE, BLOOD: Lipase: 37

## 2011-08-13 ENCOUNTER — Other Ambulatory Visit: Payer: Self-pay | Admitting: Family Medicine

## 2011-08-13 NOTE — Telephone Encounter (Signed)
Pt would like refills on temazepam 30 mg call [facility] [address] [id number]

## 2011-08-14 NOTE — Telephone Encounter (Signed)
Pt states she got her prescription taking care of. She got the prescription from [facility] [address].

## 2011-08-27 LAB — DIFFERENTIAL
Basophils Absolute: 0
Basophils Relative: 1
Monocytes Absolute: 0.5
Neutro Abs: 2.8
Neutrophils Relative %: 63

## 2011-08-27 LAB — BASIC METABOLIC PANEL
CO2: 25
CO2: 25
Calcium: 8 — ABNORMAL LOW
Chloride: 109
Chloride: 110
Creatinine, Ser: 1.23 — ABNORMAL HIGH
Creatinine, Ser: 1.44 — ABNORMAL HIGH
GFR calc Af Amer: 42 — ABNORMAL LOW
GFR calc Af Amer: 51 — ABNORMAL LOW
GFR calc Af Amer: 53 — ABNORMAL LOW
Glucose, Bld: 136 — ABNORMAL HIGH
Potassium: 3.1 — ABNORMAL LOW

## 2011-08-27 LAB — COMPREHENSIVE METABOLIC PANEL
Alkaline Phosphatase: 71
BUN: 32 — ABNORMAL HIGH
CO2: 27
Chloride: 108
GFR calc non Af Amer: 38 — ABNORMAL LOW
Glucose, Bld: 103 — ABNORMAL HIGH
Potassium: 4.6
Total Bilirubin: 0.8

## 2011-08-27 LAB — CBC
HCT: 23.4 — ABNORMAL LOW
HCT: 34.1 — ABNORMAL LOW
Hemoglobin: 11.6 — ABNORMAL LOW
MCHC: 34.4
MCHC: 35.1
MCV: 89.3
MCV: 90.3
MCV: 90.9
Platelets: 149 — ABNORMAL LOW
RBC: 2.62 — ABNORMAL LOW
RBC: 2.72 — ABNORMAL LOW
RBC: 2.75 — ABNORMAL LOW
RBC: 3.24 — ABNORMAL LOW
RDW: 14.2 — ABNORMAL HIGH
WBC: 4.3
WBC: 4.4
WBC: 5.2

## 2011-08-27 LAB — TYPE AND SCREEN: ABO/RH(D): AB POS

## 2011-08-27 LAB — PROTIME-INR
INR: 1
Prothrombin Time: 12.9

## 2011-08-27 LAB — URIC ACID: Uric Acid, Serum: 7.2 — ABNORMAL HIGH

## 2011-09-09 ENCOUNTER — Ambulatory Visit (INDEPENDENT_AMBULATORY_CARE_PROVIDER_SITE_OTHER): Payer: Medicare Other

## 2011-09-09 DIAGNOSIS — Z23 Encounter for immunization: Secondary | ICD-10-CM

## 2011-11-02 ENCOUNTER — Encounter: Payer: Self-pay | Admitting: Family Medicine

## 2011-11-02 ENCOUNTER — Ambulatory Visit (HOSPITAL_COMMUNITY)
Admission: RE | Admit: 2011-11-02 | Discharge: 2011-11-02 | Disposition: A | Discharge status: Home / Self Care | Payer: Medicare Other | Source: Ambulatory Visit | Attending: Family Medicine | Admitting: Family Medicine

## 2011-11-02 ENCOUNTER — Ambulatory Visit (INDEPENDENT_AMBULATORY_CARE_PROVIDER_SITE_OTHER): Payer: Medicare Other | Admitting: Family Medicine

## 2011-11-02 VITALS — BP 150/78 | Temp 97.6°F | Wt 162.0 lb

## 2011-11-02 DIAGNOSIS — R110 Nausea: Secondary | ICD-10-CM | POA: Insufficient documentation

## 2011-11-02 DIAGNOSIS — Q619 Cystic kidney disease, unspecified: Secondary | ICD-10-CM | POA: Insufficient documentation

## 2011-11-02 DIAGNOSIS — N289 Disorder of kidney and ureter, unspecified: Secondary | ICD-10-CM | POA: Insufficient documentation

## 2011-11-02 DIAGNOSIS — R1031 Right lower quadrant pain: Secondary | ICD-10-CM

## 2011-11-02 DIAGNOSIS — K449 Diaphragmatic hernia without obstruction or gangrene: Secondary | ICD-10-CM | POA: Insufficient documentation

## 2011-11-02 DIAGNOSIS — D7389 Other diseases of spleen: Secondary | ICD-10-CM | POA: Insufficient documentation

## 2011-11-02 DIAGNOSIS — R197 Diarrhea, unspecified: Secondary | ICD-10-CM | POA: Insufficient documentation

## 2011-11-02 DIAGNOSIS — Z9089 Acquired absence of other organs: Secondary | ICD-10-CM | POA: Insufficient documentation

## 2011-11-02 LAB — CBC
HCT: 34.8 % — ABNORMAL LOW (ref 36.0–46.0)
MCH: 30.7 pg (ref 26.0–34.0)
MCV: 92.8 fL (ref 78.0–100.0)
Platelets: 221 10*3/uL (ref 150–400)
RBC: 3.75 MIL/uL — ABNORMAL LOW (ref 3.87–5.11)

## 2011-11-02 LAB — LIPASE, BLOOD: Lipase: 44 U/L (ref 11–59)

## 2011-11-02 LAB — HEPATIC FUNCTION PANEL
AST: 20 U/L (ref 0–37)
Albumin: 4.3 g/dL (ref 3.5–5.2)
Total Bilirubin: 0.3 mg/dL (ref 0.3–1.2)
Total Protein: 8.2 g/dL (ref 6.0–8.3)

## 2011-11-02 LAB — BASIC METABOLIC PANEL
CO2: 23 mEq/L (ref 19–32)
Calcium: 10 mg/dL (ref 8.4–10.5)
Chloride: 104 mEq/L (ref 96–112)
Creatinine, Ser: 1.47 mg/dL — ABNORMAL HIGH (ref 0.50–1.10)
Glucose, Bld: 123 mg/dL — ABNORMAL HIGH (ref 70–99)
Sodium: 138 mEq/L (ref 135–145)

## 2011-11-02 LAB — DIFFERENTIAL
Eosinophils Absolute: 0 10*3/uL (ref 0.0–0.7)
Eosinophils Relative: 0 % (ref 0–5)
Lymphs Abs: 0.8 10*3/uL (ref 0.7–4.0)
Monocytes Absolute: 0.2 10*3/uL (ref 0.1–1.0)

## 2011-11-02 MED ORDER — CIPROFLOXACIN HCL 750 MG PO TABS: 750.0000 mg | ORAL_TABLET | Freq: Two times a day (BID) | ORAL | Status: AC

## 2011-11-02 MED ORDER — METRONIDAZOLE 500 MG PO TABS: 500.0000 mg | ORAL_TABLET | Freq: Three times a day (TID) | ORAL | Status: AC

## 2011-11-02 NOTE — Patient Instructions (Signed)
Go to [facility], first to LAB  Then go to [facility]  Go through main entrance

## 2011-11-02 NOTE — Progress Notes (Signed)
Addended by: Owens Loffler on: 11/02/2011 04:11 PM   Modules accepted: Orders

## 2011-11-02 NOTE — Progress Notes (Addendum)
Patient Name: [name] Date of Birth: [birth year] Age: 77 y.o. Medical Record Number: [id number] Gender: female Date of Encounter: 11/02/2011  History of Present Illness:  [name] is a 77 y.o. very pleasant female patient who presents with the following:  A pleasant female with a history of prior partial colectomy distantly secondary to polyp not amenable to snaring on colonoscopy, who presents with acute onset abdominal pain that began this morning. This is all than on the RIGHT side. She did have a small around of diarrhea when she stood up and got up this morning. Currently, she is having significant pain, and some significant nausea.  Woke up this morning and has pain in her lower side on the right side --- has had a lot of colon surgery. Partial colectomy. As soon as stood on her feet, had a small amount of diarrhea, but then it got loose. Now with fairly bad pain and nausea.   She denies any urinary symptoms. Denies any dysuria or burning. She denies any genitourinary complaints or any vaginal discharge. She is also status post RIGHT ovary removal in the past.  Past Medical History, Surgical History, Social History, Family History, Problem List, Medications, and Allergies have been reviewed and updated if relevant.  Review of Systems: ROS: GEN: Acute illness details above GI: as above GU: maintaining adequate hydration and urination Pulm: No SOB Interactive and getting along well at home.  Otherwise, ROS is as per the HPI.   Physical Examination: Filed Vitals:   11/02/11 1135  BP: 150/78  Temp: 97.6 F (36.4 C)  TempSrc: Oral  Weight: 162 lb (73.483 kg)    There is no height on file to calculate BMI.  GEN: WDWN, NAD, Non-toxic, A & O x 3 HEENT: Atraumatic, Normocephalic. Neck supple. No masses, No LAD. Ears and Nose: No external deformity. CV: RRR, No M/G/R. No JVD. No thrill. No extra heart sounds. PULM: CTA B, no wheezes, crackles, rhonchi. No  retractions. No resp. distress. No accessory muscle use. ABD: S, moderately tender in the right lower abdomen, ND, +BS. No rebound. No HSM. No rebound. EXTR: No c/c/e NEURO Normal gait.  PSYCH: Normally interactive. Conversant. Not depressed or anxious appearing.  Calm demeanor.    Assessment and Plan: 1. Right lower quadrant abdominal pain  Basic metabolic panel, Hepatic function panel, Lipase, CBC with Differential, CT Abdomen Pelvis Wo Contrast    Differential diagnosis is broad. In an elderly female, will obtain laboratories as above.  Also imaging is needed. Significant abdominal pain in an elderly female with a prior abdominal surgical history. She does also have a history of renal impairment, so I'll not use contrast dye intravenously. CT of the abdomen and pelvis without contrast, with oral contrast only. Evaluate for potential diverticulitis, perforated abdomen, volvulus, or other acute intra-abdominal process.  Addendum: Findings noted and discussed with patient. Will cover for potential GI pathogen, diverticulitis not seen on CT. Advised recheck on Monday. If improved can f/u at the end of the week for non-emergent f/u to address concerns on CT.  CBC:    Component Value Date/Time   WBC 4.1 11/02/2011 1300   WBC 4.3 03/19/2006 0952   HGB 11.5* 11/02/2011 1300   HGB 11.4* 03/19/2006 0952   HCT 34.8* 11/02/2011 1300   HCT 33.5* 03/19/2006 0952   PLT 221 11/02/2011 1300   PLT 215 03/19/2006 0952   MCV 92.8 11/02/2011 1300   MCV 92.0 03/19/2006 0952   NEUTROABS 3.0 11/02/2011 1300  NEUTROABS 2.6 03/19/2006 0952   LYMPHSABS 0.8 11/02/2011 1300   LYMPHSABS 1.2 03/19/2006 0952   MONOABS 0.2 11/02/2011 1300   MONOABS 0.5 03/19/2006 0952   EOSABS 0.0 11/02/2011 1300   EOSABS 0.1 03/19/2006 0952   BASOSABS 0.0 11/02/2011 1300   BASOSABS 0.0 03/19/2006 0952   Comprehensive Metabolic Panel:    Component Value Date/Time   NA 138 11/02/2011 1300   K 4.6 11/02/2011 1300   CL 104 11/02/2011 1300    CO2 23 11/02/2011 1300   BUN 32* 11/02/2011 1300   CREATININE 1.47* 11/02/2011 1300   GLUCOSE 123* 11/02/2011 1300   CALCIUM 10.0 11/02/2011 1300   AST 20 11/02/2011 1300   ALT 11 11/02/2011 1300   ALKPHOS 106 11/02/2011 1300   BILITOT 0.3 11/02/2011 1300   PROT 8.2 11/02/2011 1300   ALBUMIN 4.3 11/02/2011 1300   Lipase     Component Value Date/Time   LIPASE 44 11/02/2011 1300   CT ABDOMEN AND PELVIS WITHOUT CONTRAST   Technique:  Multidetector CT imaging of the abdomen and pelvis was performed following the standard protocol without intravenous contrast. Sagittal and coronal MPR images reconstructed from axial data set.   Comparison: 12/23/2006   Findings: Lung bases clear. Moderate sized hiatal hernia, with question wall thickening versus redundant mucosa from collapsed state. Calcified granulomata spleen. Small cyst left kidney 1.3 x 1.1 cm image 21. Exophytic nodule lower pole right kidney, new, intermediate attenuation. 3 mm calculus upper pole right kidney. Liver, spleen, pancreas, kidneys, and adrenal glands otherwise unremarkable for noncontrast technique.   Scattered atherosclerotic calcifications without aneurysm. Appendix surgically absent. Atrophic uterus and ovaries. Unremarkable bladder and ureters. Scattered pelvic phleboliths. Large and small bowel loops normal appearance. No mass, adenopathy, free fluid or inflammatory process. Small amount nonspecific air in vagina. Bones diffusely demineralized. No acute intra abdominal or intrapelvic inflammatory process identified. Multilevel degenerative disc and facet disease changes of the thoracolumbar spine.   IMPRESSION: Nonobstructing 3 mm right renal calculus. Small left renal cyst. Hiatal hernia. Indeterminate attenuation exophytic nodule lower pole right kidney 1.3 x 1.0 cm, new since prior exam; this could represent a complicated cyst or solid lesion. Due to small size, recommend follow-up  non emergent MR imaging with and without contrast to exclude renal neoplasm. No acute intra abdominal or intrapelvic inflammatory process identified.   Original Report Authenticated By: Burnetta Sabin, M.D.

## 2011-11-04 ENCOUNTER — Telehealth: Payer: Self-pay | Admitting: Internal Medicine

## 2011-11-04 NOTE — Telephone Encounter (Signed)
Taken care of some time ago over weekend.

## 2011-11-04 NOTE — Telephone Encounter (Signed)
Lattie Haw is calling with results for CT ordered by Dr. Lorelei Pont.  The results are Non-obstructing 57mm right renal calculi and were read by Lavonia Dana.   Message taken by: Cheyenne Adas

## 2011-11-07 ENCOUNTER — Telehealth: Payer: Self-pay | Admitting: Gastroenterology

## 2011-11-07 NOTE — Telephone Encounter (Signed)
Phone is busy, I will continue to try and reach the patietn.

## 2011-11-07 NOTE — Telephone Encounter (Signed)
Patient was seen in the urgent Saturday clinic for RLQ pain.  Dr Lorelei Pont ordered a CT scan see findings below:  IMPRESSION:  Nonobstructing 3 mm right renal calculus.  Small left renal cyst.  Hiatal hernia.  Indeterminate attenuation exophytic nodule lower pole right kidney  1.3 x 1.0 cm, new since prior exam; this could represent a  complicated cyst or solid lesion.  Due to small size, recommend follow-up non emergent MR imaging with  and without contrast to exclude renal neoplasm.  No acute intra abdominal or intrapelvic inflammatory process  identified.   He treated her with cipro and flagyl.  She is calling today because the side effects of the meds.  She wants to know if she can stop them.  Dr Lorelei Pont wanted her to follow up with her primary care for CT findings.  I have asked her to contact Dr Barbie Banner office about being seen.  She does report the pain has improved and wants to stop the cipro and flagyl.  I have again asked her to please contact her primary care MD for further direction on this.  Dr Fuller Plan she is asking if she should see GI.  Her only GI complaint is constipation, and I have advised her to take Miralax for this.  Please review Dr Lillie Fragmin note from Saturday and CT and advise

## 2011-11-07 NOTE — Telephone Encounter (Signed)
She should follow up with her primary as recommended who can decide about antibiotics. Continue Miralax for constipation I do not see any GI pathology on the CT.

## 2011-11-07 NOTE — Telephone Encounter (Signed)
Patient advised of Dr. Stark's recommendations. 

## 2011-11-11 ENCOUNTER — Encounter: Payer: Self-pay | Admitting: Family Medicine

## 2011-11-11 ENCOUNTER — Ambulatory Visit (INDEPENDENT_AMBULATORY_CARE_PROVIDER_SITE_OTHER): Payer: Medicare Other | Admitting: Family Medicine

## 2011-11-11 VITALS — BP 140/78 | HR 64 | Temp 98.4°F | Wt 164.0 lb

## 2011-11-11 DIAGNOSIS — N289 Disorder of kidney and ureter, unspecified: Secondary | ICD-10-CM

## 2011-11-11 DIAGNOSIS — N2889 Other specified disorders of kidney and ureter: Secondary | ICD-10-CM

## 2011-11-11 DIAGNOSIS — K5732 Diverticulitis of large intestine without perforation or abscess without bleeding: Secondary | ICD-10-CM

## 2011-11-11 MED ORDER — TEMAZEPAM 30 MG PO CAPS: 30.0000 mg | ORAL_CAPSULE | Freq: Every evening | ORAL | Status: DC | PRN

## 2011-11-11 NOTE — Progress Notes (Signed)
  Subjective:    Patient ID: [name], female    DOB: 1923/12/25, 77 y.o.   MRN: [id number]  HPI Here to follow up on RLQ abdominal pain. She was seen on 11-02-11 by Dr. Lorelei Pont for this, and she had labs and a CT scan that day. Her labs looked good, and her CT showed an unremarkable GI tract. It was felt that she may have had diverticulitis, so she was given Flagyl and Cipro. She has taken 10 days of this, and she feels much better. The pain is gone, and her BMs are normal. No fever or nausea. The scan did show a new exophytic nodule off the right kidney, and further imaging was recommended.    Review of Systems  Constitutional: Negative.   Respiratory: Negative.   Cardiovascular: Negative.   Gastrointestinal: Negative.   Genitourinary: Negative.        Objective:   Physical Exam  Constitutional: She appears well-developed and well-nourished.  Neck: No thyromegaly present.  Cardiovascular: Normal rate, regular rhythm, normal heart sounds and intact distal pulses.   Pulmonary/Chest: Effort normal and breath sounds normal.  Abdominal: Soft. Bowel sounds are normal. She exhibits no distension and no mass. There is no tenderness. There is no rebound and no guarding.  Lymphadenopathy:    She has no cervical adenopathy.          Assessment & Plan:  This was probably a case of diverticulitis, and this seems to have resolved. We will set her up for an MRI of the right kidney to assess this nodule.

## 2011-11-15 ENCOUNTER — Ambulatory Visit
Admission: RE | Admit: 2011-11-15 | Discharge: 2011-11-15 | Disposition: A | Discharge status: Home / Self Care | Payer: Medicare Other | Source: Ambulatory Visit | Attending: Family Medicine | Admitting: Family Medicine

## 2011-11-15 DIAGNOSIS — N2889 Other specified disorders of kidney and ureter: Secondary | ICD-10-CM

## 2011-11-15 MED ORDER — GADOBENATE DIMEGLUMINE 529 MG/ML IV SOLN
14.0000 mL | Freq: Once | INTRAVENOUS | Status: AC | PRN
  Administered 2011-11-15: 12:00:00 via INTRAVENOUS

## 2011-11-20 ENCOUNTER — Telehealth: Payer: Self-pay | Admitting: *Deleted

## 2011-11-20 ENCOUNTER — Other Ambulatory Visit: Payer: Self-pay | Admitting: Dermatology

## 2011-11-20 NOTE — Progress Notes (Signed)
Quick Note:  Spoke with pt ______ 

## 2011-11-20 NOTE — Telephone Encounter (Signed)
Pt is calling for MRI results, please.

## 2011-11-20 NOTE — Telephone Encounter (Signed)
I spoke with pt and gave results.  

## 2011-11-20 NOTE — Progress Notes (Signed)
Addended by: [name] A on: 11/20/2011 11:06 AM   Modules accepted: Orders

## 2011-11-20 NOTE — Telephone Encounter (Addendum)
Pt is calling back stating she is getting very anxious about her MRI results, and was told she would hear Monday or Tuesday.  Please call ASAP.  Sent message to Sunday Spillers so she could hear how anxious pt is sounding.

## 2012-01-14 ENCOUNTER — Encounter: Payer: Self-pay | Admitting: Gastroenterology

## 2012-01-14 ENCOUNTER — Other Ambulatory Visit: Payer: Self-pay

## 2012-01-14 NOTE — Telephone Encounter (Signed)
Rx reqeust for temazepam 30 mg.  Rx last filled 11/11/11 #30x5 rf.  Pt last seen 11/11/11.  Pls advise.

## 2012-01-15 NOTE — Telephone Encounter (Signed)
Spoke with pt and she did not need this medication right now, request was a mistake.

## 2012-01-15 NOTE — Telephone Encounter (Signed)
NO refills yet, she was given a 6 month supply in December.

## 2012-02-17 ENCOUNTER — Telehealth: Payer: Self-pay | Admitting: *Deleted

## 2012-02-17 NOTE — Telephone Encounter (Signed)
Pt calling back because she hasn't heard anything. Wants to know if she can take the prednisone. Please call to advise. Thanks.

## 2012-02-17 NOTE — Telephone Encounter (Signed)
She needs an OV for this. I would not take prednisone until we see her. Use Delsym OTC

## 2012-02-17 NOTE — Telephone Encounter (Signed)
Spoke with pt and we will get her schedule for in the morning.

## 2012-02-17 NOTE — Telephone Encounter (Signed)
(  triage voicemail)  Pt states she is experiencing severe congestion and cough and would like to have something called in.  Please call before calling in a rx because pt already has a rx for prednisone and wants to know if she can take for her symptoms.  If not please advise what she should take or if she needs to come in for an appt.

## 2012-02-18 ENCOUNTER — Encounter: Payer: Self-pay | Admitting: Family Medicine

## 2012-02-18 ENCOUNTER — Ambulatory Visit (INDEPENDENT_AMBULATORY_CARE_PROVIDER_SITE_OTHER): Payer: Medicare Other | Admitting: Family Medicine

## 2012-02-18 VITALS — BP 146/70 | HR 70 | Temp 98.3°F | Wt 160.0 lb

## 2012-02-18 DIAGNOSIS — J329 Chronic sinusitis, unspecified: Secondary | ICD-10-CM

## 2012-02-18 MED ORDER — HYDROCODONE-HOMATROPINE 5-1.5 MG/5ML PO SYRP: 5.0000 mL | ORAL_SOLUTION | ORAL | Status: AC | PRN

## 2012-02-18 MED ORDER — AZITHROMYCIN 250 MG PO TABS: ORAL_TABLET | ORAL | Status: AC

## 2012-02-18 NOTE — Progress Notes (Signed)
  Subjective:    Patient ID: [name], female    DOB: 16-Jul-1924, 78 y.o.   MRN: [id number]  HPI Here for one week of sinus pressure, PND, and coughing up green sputum. No fever.    Review of Systems  Constitutional: Negative.   HENT: Positive for congestion, postnasal drip and sinus pressure.   Eyes: Negative.   Respiratory: Positive for cough.        Objective:   Physical Exam  Constitutional: She appears well-developed and well-nourished.  HENT:  Right Ear: External ear normal.  Left Ear: External ear normal.  Nose: Nose normal.  Mouth/Throat: Oropharynx is clear and moist. No oropharyngeal exudate.  Eyes: Conjunctivae are normal.  Pulmonary/Chest: Effort normal and breath sounds normal. No respiratory distress. She has no wheezes. She has no rales.  Lymphadenopathy:    She has no cervical adenopathy.          Assessment & Plan:  Add Mucinex

## 2012-03-23 ENCOUNTER — Encounter: Payer: Self-pay | Admitting: Gastroenterology

## 2012-03-23 ENCOUNTER — Ambulatory Visit (INDEPENDENT_AMBULATORY_CARE_PROVIDER_SITE_OTHER): Payer: Medicare Other | Admitting: Gastroenterology

## 2012-03-23 VITALS — BP 140/68 | HR 60 | Ht 64.0 in | Wt 158.0 lb

## 2012-03-23 DIAGNOSIS — Z86010 Personal history of colonic polyps: Secondary | ICD-10-CM

## 2012-03-23 DIAGNOSIS — K589 Irritable bowel syndrome without diarrhea: Secondary | ICD-10-CM

## 2012-03-23 MED ORDER — MOVIPREP 100 G PO SOLR: 1.0000 | Freq: Once | ORAL | Status: DC

## 2012-03-23 NOTE — Patient Instructions (Signed)
You have been scheduled for a colonoscopy with propofol. Please follow written instructions given to you at your visit today.  Please pick up your prep kit at the pharmacy within the next 1-3 days. cc: [name], MD

## 2012-03-23 NOTE — Progress Notes (Signed)
History of Present Illness: This is an 78 year old female with a history of a tubulovillous adenomatous colon polyp in 2007 who underwent APR. She has ongoing irritable bowel syndrome and her symptoms are well-controlled on hyoscyamine. Denies weight loss, constipation, diarrhea, change in stool caliber, melena, hematochezia, nausea, vomiting, dysphagia, reflux symptoms, chest pain.   Current Medications, Allergies, Past Medical History, Past Surgical History, Family History and Social History were reviewed in [employer] record.  Physical Exam: General: Well developed , well nourished, no acute distress Head: Normocephalic and atraumatic Eyes:  sclerae anicteric, EOMI Ears: Normal auditory acuity Mouth: No deformity or lesions Lungs: Clear throughout to auscultation Heart: Regular rate and rhythm; no murmurs, rubs or bruits Abdomen: Soft, non tender and non distended. No masses, hepatosplenomegaly or hernias noted. Normal Bowel sounds Rectal: Deferred to colonoscopy Musculoskeletal: Symmetrical with no gross deformities  Pulses:  Normal pulses noted Extremities: No clubbing, cyanosis, edema or deformities noted Neurological: Alert oriented x 4, grossly nonfocal Psychological:  Alert and cooperative. Normal mood and affect  Assessment and Recommendations:  1. Irritable bowel syndrome. Continue hyoscyamine every 4 hours as needed.  2. History of tubulovillous adenoma status post APR. She is due for surveillance colonoscopy. Her health status is good and she would like to proceed. The risks, benefits, and alternatives to colonoscopy with possible biopsy and possible polypectomy were discussed with the patient and they consent to proceed.

## 2012-04-24 ENCOUNTER — Other Ambulatory Visit: Payer: Self-pay | Admitting: Gastroenterology

## 2012-05-04 ENCOUNTER — Encounter: Payer: Self-pay | Admitting: Gastroenterology

## 2012-05-04 ENCOUNTER — Ambulatory Visit (AMBULATORY_SURGERY_CENTER): Payer: Medicare Other | Admitting: Gastroenterology

## 2012-05-04 VITALS — BP 126/72 | HR 78 | Temp 95.2°F | Resp 20 | Ht 64.0 in | Wt 158.0 lb

## 2012-05-04 DIAGNOSIS — Z860100 Personal history of colon polyps, unspecified: Secondary | ICD-10-CM

## 2012-05-04 DIAGNOSIS — K589 Irritable bowel syndrome without diarrhea: Secondary | ICD-10-CM

## 2012-05-04 DIAGNOSIS — Z1211 Encounter for screening for malignant neoplasm of colon: Secondary | ICD-10-CM

## 2012-05-04 MED ORDER — SODIUM CHLORIDE 0.9 % IV SOLN: 500.0000 mL | INTRAVENOUS | Status: DC

## 2012-05-04 NOTE — Patient Instructions (Addendum)
YOU HAD AN ENDOSCOPIC PROCEDURE TODAY AT THE [facility]: Refer to the procedure report that was given to you for any specific questions about what was found during the examination.  If the procedure report does not answer your questions, please call your gastroenterologist to clarify.  If you requested that your care partner not be given the details of your procedure findings, then the procedure report has been included in a sealed envelope for you to review at your convenience later.  YOU SHOULD EXPECT: Some feelings of bloating in the abdomen. Passage of more gas than usual.  Walking can help get rid of the air that was put into your GI tract during the procedure and reduce the bloating. If you had a lower endoscopy (such as a colonoscopy or flexible sigmoidoscopy) you may notice spotting of blood in your stool or on the toilet paper. If you underwent a bowel prep for your procedure, then you may not have a normal bowel movement for a few days.  DIET: Your first meal following the procedure should be a light meal and then it is ok to progress to your normal diet.  A half-sandwich or bowl of soup is an example of a good first meal.  Heavy or fried foods are harder to digest and may make you feel nauseous or bloated.  Likewise meals heavy in dairy and vegetables can cause extra gas to form and this can also increase the bloating.  Drink plenty of fluids but you should avoid alcoholic beverages for 24 hours.  ACTIVITY: Your care partner should take you home directly after the procedure.  You should plan to take it easy, moving slowly for the rest of the day.  You can resume normal activity the day after the procedure however you should NOT DRIVE or use heavy machinery for 24 hours (because of the sedation medicines used during the test).    SYMPTOMS TO REPORT IMMEDIATELY: A gastroenterologist can be reached at any hour.  During normal business hours, 8:30 AM to 5:00 PM Monday through Friday,  call [phone].  After hours and on weekends, please call the GI answering service at [phone] who will take a message and have the physician on call contact you.   Following lower endoscopy (colonoscopy or flexible sigmoidoscopy):  Excessive amounts of blood in the stool  Significant tenderness or worsening of abdominal pains  Swelling of the abdomen that is new, acute  Fever of 100F or higher  Following upper endoscopy (EGD)  Vomiting of blood or coffee ground material  New chest pain or pain under the shoulder blades  Painful or persistently difficult swallowing  New shortness of breath  Fever of 100F or higher  Black, tarry-looking stools  FOLLOW UP: If any biopsies were taken you will be contacted by phone or by letter within the next 1-3 weeks.  Call your gastroenterologist if you have not heard about the biopsies in 3 weeks.  Our staff will call the home number listed on your records the next business day following your procedure to check on you and address any questions or concerns that you may have at that time regarding the information given to you following your procedure. This is a courtesy call and so if there is no answer at the home number and we have not heard from you through the emergency physician on call, we will assume that you have returned to your regular daily activities without incident.  SIGNATURES/CONFIDENTIALITY: You and/or your care   partner have signed paperwork which will be entered into your electronic medical record.  These signatures attest to the fact that that the information above on your After Visit Summary has been reviewed and is understood.  Full responsibility of the confidentiality of this discharge information lies with you and/or your care-partner.    Hemmorrhoid information given.    You will not need any futher colonoscopies or polyp surveilance, given your age.

## 2012-05-04 NOTE — Progress Notes (Signed)
Patient did not experience any of the following events: a burn prior to discharge; a fall within the facility; wrong site/side/patient/procedure/implant event; or a hospital transfer or hospital admission upon discharge from the facility. (G8907) Patient did not have preoperative order for IV antibiotic SSI prophylaxis. (G8918)  

## 2012-05-04 NOTE — Op Note (Signed)
[place] [facility] [address] [employer] [address], [place]  [address]  COLONOSCOPY PROCEDURE REPORT  PATIENT:  [name], [name]  MR#:  [id number] BIRTHDATE:  [birth year], 87 yrs. old  GENDER:  female ENDOSCOPIST:  Norberto Sorenson T. Fuller Plan, MD, [facility]  PROCEDURE DATE:  05/04/2012 PROCEDURE:  Higher-risk screening colonoscopy G0105 ASA CLASS:  Class III INDICATIONS:  1) surveillance and high-risk screening  2) history of pre-cancerous (adenomatous) colon polyps: TVA 2007 MEDICATIONS:   MAC sedation, administered by CRNA, propofol (Diprivan) 250 mg IV DESCRIPTION OF PROCEDURE:   After the risks benefits and alternatives of the procedure were thoroughly explained, informed consent was obtained.  Digital rectal exam was performed and revealed no abnormalities.   The LB [id number] endoscope was introduced through the anus and advanced to the cecum, which was identified by both the appendix and ileocecal valve, without limitations.  The quality of the prep was adequate, using MoviPrep.  The instrument was then slowly withdrawn as the colon was fully examined. <<PROCEDUREIMAGES>> FINDINGS:  There was evidence of a prior segmental colectomy in the sigmoid colon. Prior APR. The anastomosis measured 12-13 mm. Otherwise normal colonoscopy without other polyps, masses, vascular ectasias, or inflammatory changes.   Retroflexed views in the rectum revealed internal hemorrhoids, small.  The time to cecum =  3.25  minutes. The scope was then withdrawn (time = 10.25  min) from the patient and the procedure completed.  COMPLICATIONS:  None  ENDOSCOPIC IMPRESSION: 1) Prior APR 2) Internal hemorrhoids  RECOMMENDATIONS: 1) Given your age, you will not need another colonoscopy for colon cancer screening or polyp surveillance.  [name], MD, [name]  n. eSIGNED:   [name] at 05/04/2012 10:04 AM  [name], [id number]

## 2012-05-05 ENCOUNTER — Telehealth: Payer: Self-pay | Admitting: *Deleted

## 2012-05-05 NOTE — Telephone Encounter (Signed)
  Follow up Call-  Call back number 05/04/2012  Post procedure Call Back phone  # [phone]  Permission to leave phone message Yes     Patient questions:  Do you have a fever, pain , or abdominal swelling? no Pain Score  0 *  Have you tolerated food without any problems? yes  Have you been able to return to your normal activities? yes  Do you have any questions about your discharge instructions: Diet   no Medications  no Follow up visit  no  Do you have questions or concerns about your Care? no  Actions: * If pain score is 4 or above: No action needed, pain <4.

## 2012-05-18 ENCOUNTER — Telehealth: Payer: Self-pay | Admitting: Gastroenterology

## 2012-05-18 NOTE — Telephone Encounter (Signed)
Patient is c/o constipation since colonoscopy.  She is advisedto start Miralax BID then titrate for effect.  She will call back for any additional questions or concerns.

## 2012-06-08 ENCOUNTER — Telehealth: Payer: Self-pay | Admitting: Family Medicine

## 2012-06-08 NOTE — Telephone Encounter (Signed)
Refill request for Temazepam 30 mg take 1 po qhs prn and last here on 02/28/12.

## 2012-06-09 MED ORDER — TEMAZEPAM 30 MG PO CAPS: 30.0000 mg | ORAL_CAPSULE | Freq: Every evening | ORAL | Status: DC | PRN

## 2012-06-09 NOTE — Telephone Encounter (Signed)
I called in script 

## 2012-06-09 NOTE — Telephone Encounter (Signed)
Call in #30 with 5 rf 

## 2012-07-24 ENCOUNTER — Other Ambulatory Visit: Payer: Self-pay | Admitting: Family Medicine

## 2012-07-30 ENCOUNTER — Telehealth: Payer: Self-pay | Admitting: Family Medicine

## 2012-07-30 NOTE — Telephone Encounter (Signed)
appt made

## 2012-07-30 NOTE — Telephone Encounter (Signed)
Caller: [name]/Patient; Patient Name: [name]; PCP: [name] [facility]); Best Callback Phone Number: [phone]  Patient calling requesting information regarding Influenza vaccine and scheduling appointment to receive vaccine. Call transferred to [name] in office.

## 2012-08-04 ENCOUNTER — Ambulatory Visit (INDEPENDENT_AMBULATORY_CARE_PROVIDER_SITE_OTHER): Payer: Medicare Other

## 2012-08-04 ENCOUNTER — Encounter: Payer: Self-pay | Admitting: Family Medicine

## 2012-08-04 DIAGNOSIS — Z23 Encounter for immunization: Secondary | ICD-10-CM

## 2012-09-17 ENCOUNTER — Ambulatory Visit (INDEPENDENT_AMBULATORY_CARE_PROVIDER_SITE_OTHER): Payer: Medicare Other | Admitting: Family Medicine

## 2012-09-17 DIAGNOSIS — Z0000 Encounter for general adult medical examination without abnormal findings: Secondary | ICD-10-CM

## 2012-09-17 DIAGNOSIS — Z2911 Encounter for prophylactic immunotherapy for respiratory syncytial virus (RSV): Secondary | ICD-10-CM

## 2012-10-01 ENCOUNTER — Other Ambulatory Visit: Payer: Self-pay | Admitting: Dermatology

## 2012-10-23 ENCOUNTER — Telehealth: Payer: Self-pay | Admitting: Family Medicine

## 2012-10-23 NOTE — Telephone Encounter (Signed)
I spoke with pt and she is going to come in on 10/26/12.

## 2012-10-23 NOTE — Telephone Encounter (Signed)
Possible work in for today? Please advise.

## 2012-10-23 NOTE — Telephone Encounter (Signed)
Patient Information:  Caller Name: Anagha  Phone: [phone]  Patient: [name], [name]  Gender: Female  DOB: 05/13/1924  Age: 78 Years  PCP: [name] [facility])  Office Follow Up:  Does the office need to follow up with this patient?: Yes  Instructions For The Office: Patient would like to be seen today if able to work into schedule.  Please follow up.  RN Note:  Patient would like to be seen today if can be worked in.  Please follow up with pateint with an appointment time.  Symptoms  Reason For Call & Symptoms: numbness to ends of fingers on right hand  Reviewed Health History In EMR: Yes  Reviewed Medications In EMR: Yes  Reviewed Allergies In EMR: Yes  Reviewed Surgeries / Procedures: Yes  Date of Onset of Symptoms: 10/09/2012  Treatments Tried: Warming hands with warm water  Treatments Tried Worked: Yes  Guideline(s) Used:  Hand and Wrist Pain  Disposition Per Guideline:   See Within 3 Days in Office  Reason For Disposition Reached:   Weakness or numbness in hand or fingers and present > 2 weeks  Advice Given:  Call Back If:  You become worse.

## 2012-10-26 ENCOUNTER — Ambulatory Visit: Payer: Medicare Other | Admitting: Family Medicine

## 2012-10-26 DIAGNOSIS — G5600 Carpal tunnel syndrome, unspecified upper limb: Secondary | ICD-10-CM

## 2012-12-02 ENCOUNTER — Other Ambulatory Visit: Payer: Self-pay | Admitting: Family Medicine

## 2012-12-04 ENCOUNTER — Telehealth: Payer: Self-pay | Admitting: Family Medicine

## 2012-12-04 NOTE — Telephone Encounter (Signed)
Already done

## 2012-12-04 NOTE — Telephone Encounter (Signed)
Okay for 6 months 

## 2012-12-04 NOTE — Telephone Encounter (Signed)
Refill request for temazepam 30 mg take 1 po qhs prn and last here on 02/18/12.

## 2013-01-20 ENCOUNTER — Other Ambulatory Visit: Payer: Self-pay | Admitting: Dermatology

## 2013-02-15 ENCOUNTER — Other Ambulatory Visit: Payer: Self-pay | Admitting: Gastroenterology

## 2013-02-25 ENCOUNTER — Emergency Department (HOSPITAL_COMMUNITY)
Admission: EM | Admit: 2013-02-25 | Discharge: 2013-02-26 | Disposition: A | Discharge status: Home / Self Care | Payer: Medicare Other | Attending: Emergency Medicine | Admitting: Emergency Medicine

## 2013-02-25 ENCOUNTER — Encounter (HOSPITAL_COMMUNITY): Payer: Self-pay | Admitting: Emergency Medicine

## 2013-02-25 DIAGNOSIS — E039 Hypothyroidism, unspecified: Secondary | ICD-10-CM | POA: Insufficient documentation

## 2013-02-25 DIAGNOSIS — Z8719 Personal history of other diseases of the digestive system: Secondary | ICD-10-CM | POA: Insufficient documentation

## 2013-02-25 DIAGNOSIS — I10 Essential (primary) hypertension: Secondary | ICD-10-CM | POA: Insufficient documentation

## 2013-02-25 DIAGNOSIS — F411 Generalized anxiety disorder: Secondary | ICD-10-CM | POA: Insufficient documentation

## 2013-02-25 DIAGNOSIS — Z9849 Cataract extraction status, unspecified eye: Secondary | ICD-10-CM | POA: Insufficient documentation

## 2013-02-25 DIAGNOSIS — K589 Irritable bowel syndrome without diarrhea: Secondary | ICD-10-CM | POA: Insufficient documentation

## 2013-02-25 DIAGNOSIS — R112 Nausea with vomiting, unspecified: Secondary | ICD-10-CM | POA: Insufficient documentation

## 2013-02-25 DIAGNOSIS — Z860100 Personal history of colon polyps, unspecified: Secondary | ICD-10-CM | POA: Insufficient documentation

## 2013-02-25 DIAGNOSIS — E785 Hyperlipidemia, unspecified: Secondary | ICD-10-CM | POA: Insufficient documentation

## 2013-02-25 DIAGNOSIS — Z7982 Long term (current) use of aspirin: Secondary | ICD-10-CM | POA: Insufficient documentation

## 2013-02-25 DIAGNOSIS — M109 Gout, unspecified: Secondary | ICD-10-CM | POA: Insufficient documentation

## 2013-02-25 DIAGNOSIS — R197 Diarrhea, unspecified: Secondary | ICD-10-CM | POA: Insufficient documentation

## 2013-02-25 DIAGNOSIS — M129 Arthropathy, unspecified: Secondary | ICD-10-CM | POA: Insufficient documentation

## 2013-02-25 DIAGNOSIS — N289 Disorder of kidney and ureter, unspecified: Secondary | ICD-10-CM | POA: Insufficient documentation

## 2013-02-25 DIAGNOSIS — Z79899 Other long term (current) drug therapy: Secondary | ICD-10-CM | POA: Insufficient documentation

## 2013-02-25 DIAGNOSIS — R109 Unspecified abdominal pain: Secondary | ICD-10-CM

## 2013-02-25 DIAGNOSIS — R1031 Right lower quadrant pain: Secondary | ICD-10-CM | POA: Insufficient documentation

## 2013-02-25 DIAGNOSIS — Z862 Personal history of diseases of the blood and blood-forming organs and certain disorders involving the immune mechanism: Secondary | ICD-10-CM | POA: Insufficient documentation

## 2013-02-25 DIAGNOSIS — J45909 Unspecified asthma, uncomplicated: Secondary | ICD-10-CM | POA: Insufficient documentation

## 2013-02-25 LAB — CBC WITH DIFFERENTIAL/PLATELET
Basophils Absolute: 0 10*3/uL (ref 0.0–0.1)
Basophils Relative: 0 % (ref 0–1)
Hemoglobin: 10.5 g/dL — ABNORMAL LOW (ref 12.0–15.0)
Lymphocytes Relative: 10 % — ABNORMAL LOW (ref 12–46)
MCHC: 33.7 g/dL (ref 30.0–36.0)
Neutro Abs: 4.6 10*3/uL (ref 1.7–7.7)
Neutrophils Relative %: 87 % — ABNORMAL HIGH (ref 43–77)
RDW: 14.6 % (ref 11.5–15.5)
WBC: 5.3 10*3/uL (ref 4.0–10.5)

## 2013-02-25 LAB — COMPREHENSIVE METABOLIC PANEL
ALT: 12 U/L (ref 0–35)
AST: 21 U/L (ref 0–37)
Albumin: 4.2 g/dL (ref 3.5–5.2)
Alkaline Phosphatase: 103 U/L (ref 39–117)
Chloride: 102 mEq/L (ref 96–112)
Potassium: 4.7 mEq/L (ref 3.5–5.1)
Total Bilirubin: 0.3 mg/dL (ref 0.3–1.2)

## 2013-02-25 LAB — URINALYSIS, ROUTINE W REFLEX MICROSCOPIC
Ketones, ur: 15 mg/dL — AB
Leukocytes, UA: NEGATIVE
Nitrite: NEGATIVE
pH: 5 (ref 5.0–8.0)

## 2013-02-25 LAB — LIPASE, BLOOD: Lipase: 62 U/L — ABNORMAL HIGH (ref 11–59)

## 2013-02-25 MED ORDER — IOHEXOL 300 MG/ML  SOLN
25.0000 mL | INTRAMUSCULAR | Status: AC
  Administered 2013-02-25: 25 mL via ORAL

## 2013-02-25 MED ORDER — SODIUM CHLORIDE 0.9 % IV SOLN
INTRAVENOUS | Status: DC
Start: 2013-02-25 — End: 2013-02-26
  Administered 2013-02-25: 22:00:00 via INTRAVENOUS

## 2013-02-25 MED ORDER — ONDANSETRON HCL 4 MG/2ML IJ SOLN
4.0000 mg | Freq: Once | INTRAMUSCULAR | Status: AC
  Administered 2013-02-25: 4 mg via INTRAVENOUS
  Filled 2013-02-25: qty 2

## 2013-02-25 MED ORDER — MORPHINE SULFATE 4 MG/ML IJ SOLN
4.0000 mg | Freq: Once | INTRAMUSCULAR | Status: AC
  Administered 2013-02-25: 4 mg via INTRAVENOUS
  Filled 2013-02-25: qty 1

## 2013-02-25 NOTE — ED Notes (Signed)
Unable to use the bathroom at this time

## 2013-02-25 NOTE — ED Provider Notes (Signed)
History     CSN: [id number]  Arrival date & time 02/25/13  2009   First MD Initiated Contact with Patient 02/25/13 2152      Chief Complaint  Patient presents with  . Abdominal Pain    (Consider location/radiation/quality/duration/timing/severity/associated sxs/prior treatment) Patient is a 79 y.o. female presenting with abdominal pain. The history is provided by the patient and medical records. No language interpreter was used.  Abdominal Pain Pain location:  RLQ (Pt has had pain in the right lower abdomen all day long.  She has had some diarrhea at home, and had 2 episodes of vomiting .   ) Pain quality: aching   Pain radiates to:  Does not radiate Pain severity:  Severe (She rates her pain at a 9.) Onset quality:  Gradual Duration:  12 hours Timing:  Constant Progression:  Unchanged Chronicity:  New (She had a similar episode of pain in December 2012, that was never explained.) Context: previous surgery   Relieved by:  None tried Worsened by:  Nothing tried Associated symptoms: diarrhea, nausea and vomiting   Associated symptoms: no chills and no fever   Risk factors: being elderly     Past Medical History  Diagnosis Date  . Hyperlipidemia   . Hypertension   . Hypothyroidism   . Arthritis   . Anemia   . Asthma   . Renal insufficiency   . Anxiety   . IBS (irritable bowel syndrome)   . Gout   . Colon polyps   . Tubulovillous adenoma(M8263/0) 4/07  . Hemorrhoids   . Diverticulosis     Past Surgical History  Procedure Laterality Date  . Lumbar disc surgery  2000  . Squamous cell skin cancer removed    . Oophorectomy      right  . Appendectomy    . Low anterior bowel resection  7/08  . Cataract extraction      Family History  Problem Relation Age of Onset  . Heart disease    . Colon cancer    . Rectal cancer    . Diabetes    . Hypertension    . Stroke    . Stomach cancer Neg Hx   . Esophageal cancer Neg Hx     History  Substance Use Topics  .  Smoking status: Never Smoker   . Smokeless tobacco: Never Used  . Alcohol Use: No    OB History   Grav Para Term Preterm Abortions TAB SAB Ect Mult Living                  Review of Systems  Constitutional: Negative for fever and chills.  HENT: Negative.   Eyes: Negative.   Respiratory: Negative.   Cardiovascular: Negative.   Gastrointestinal: Positive for nausea, vomiting, abdominal pain and diarrhea.  Genitourinary: Negative.   Musculoskeletal: Negative.   Skin: Negative.   Neurological: Negative.   Psychiatric/Behavioral: Negative.     Allergies  Amoxicillin; Codeine phosphate; Penicillins; and Xifaxan  Home Medications   Current Outpatient Rx  Name  Route  Sig  Dispense  Refill  . amLODipine (NORVASC) 5 MG tablet   Oral   Take 2.5 mg by mouth 2 (two) times daily.          Marland Kitchen aspirin 81 MG tablet   Oral   Take 81 mg by mouth daily.           . chlorthalidone (HYGROTON) 50 MG tablet   Oral   Take 1 tablet by  mouth every morning.          . cloNIDine (CATAPRES) 0.1 MG tablet   Oral   Take 0.1 mg by mouth every evening.          . hyoscyamine (LEVSIN, ANASPAZ) 0.125 MG tablet   Oral   Take 0.125 mg by mouth every 4 (four) hours as needed.         Marland Kitchen levothyroxine (SYNTHROID, LEVOTHROID) 88 MCG tablet   Oral   Take 1 tablet (88 mcg total) by mouth daily.   90 tablet   2   . telmisartan (MICARDIS) 80 MG tablet   Oral   Take 80 mg by mouth daily.         . temazepam (RESTORIL) 30 MG capsule   Oral   Take 30 mg by mouth at bedtime as needed for sleep.           BP 169/79  Pulse 62  Temp(Src) 97.9 F (36.6 C) (Oral)  Resp 16  SpO2 100%  Physical Exam  Nursing note and vitals reviewed. Constitutional: She is oriented to person, place, and time.  Pleasant elderly woman in moderate distress with RLQ abdominal pain.  HENT:  Head: Normocephalic and atraumatic.  Right Ear: External ear normal.  Left Ear: External ear normal.   Mouth/Throat: Oropharynx is clear and moist.  Eyes: Conjunctivae and EOM are normal. Pupils are equal, round, and reactive to light. No scleral icterus.  Neck: Normal range of motion. Neck supple.  Cardiovascular: Normal rate, regular rhythm and normal heart sounds.   Pulmonary/Chest: Effort normal and breath sounds normal.  Abdominal: Soft.  She localizes pain to the right lower quadrant.  There is no mass or point tenderness; bowel sounds are normal.    Musculoskeletal: Normal range of motion. She exhibits no edema and no tenderness.  Neurological: She is alert and oriented to person, place, and time.  No sensory or motor deficit.  Skin: Skin is warm and dry.  Psychiatric: She has a normal mood and affect. Her behavior is normal.    ED Course  Procedures (including critical care time)  Results for orders placed during the hospital encounter of 02/25/13  URINALYSIS, ROUTINE W REFLEX MICROSCOPIC      Result Value Range   Color, Urine YELLOW  YELLOW   APPearance CLEAR  CLEAR   Specific Gravity, Urine 1.020  1.005 - 1.030   pH 5.0  5.0 - 8.0   Glucose, UA NEGATIVE  NEGATIVE mg/dL   Hgb urine dipstick NEGATIVE  NEGATIVE   Bilirubin Urine NEGATIVE  NEGATIVE   Ketones, ur 15 (*) NEGATIVE mg/dL   Protein, ur NEGATIVE  NEGATIVE mg/dL   Urobilinogen, UA 0.2  0.0 - 1.0 mg/dL   Nitrite NEGATIVE  NEGATIVE   Leukocytes, UA NEGATIVE  NEGATIVE  CBC WITH DIFFERENTIAL      Result Value Range   WBC 5.3  4.0 - 10.5 K/uL   RBC 3.65 (*) 3.87 - 5.11 MIL/uL   Hemoglobin 10.5 (*) 12.0 - 15.0 g/dL   HCT 31.2 (*) 36.0 - 46.0 %   MCV 85.5  78.0 - 100.0 fL   MCH 28.8  26.0 - 34.0 pg   MCHC 33.7  30.0 - 36.0 g/dL   RDW 14.6  11.5 - 15.5 %   Platelets 246  150 - 400 K/uL   Neutrophils Relative 87 (*) 43 - 77 %   Neutro Abs 4.6  1.7 - 7.7 K/uL   Lymphocytes Relative 10 (*)  12 - 46 %   Lymphs Abs 0.5 (*) 0.7 - 4.0 K/uL   Monocytes Relative 3  3 - 12 %   Monocytes Absolute 0.2  0.1 - 1.0 K/uL    Eosinophils Relative 0  0 - 5 %   Eosinophils Absolute 0.0  0.0 - 0.7 K/uL   Basophils Relative 0  0 - 1 %   Basophils Absolute 0.0  0.0 - 0.1 K/uL  COMPREHENSIVE METABOLIC PANEL      Result Value Range   Sodium 137  135 - 145 mEq/L   Potassium 4.7  3.5 - 5.1 mEq/L   Chloride 102  96 - 112 mEq/L   CO2 21  19 - 32 mEq/L   Glucose, Bld 160 (*) 70 - 99 mg/dL   BUN 46 (*) 6 - 23 mg/dL   Creatinine, Ser 1.65 (*) 0.50 - 1.10 mg/dL   Calcium 9.8  8.4 - 10.5 mg/dL   Total Protein 8.0  6.0 - 8.3 g/dL   Albumin 4.2  3.5 - 5.2 g/dL   AST 21  0 - 37 U/L   ALT 12  0 - 35 U/L   Alkaline Phosphatase 103  39 - 117 U/L   Total Bilirubin 0.3  0.3 - 1.2 mg/dL   GFR calc non Af Amer 27 (*) >90 mL/min   GFR calc Af Amer 31 (*) >90 mL/min  LIPASE, BLOOD      Result Value Range   Lipase 62 (*) 11 - 59 U/L   No results found.   10:39 PM  Date: 02/25/2013  Rate: 60  Rhythm: normal sinus rhythm  QRS Axis: normal  Intervals: normal  ST/T Wave abnormalities: normal  Conduction Disutrbances:none  Narrative Interpretation: Normal EKG  Old EKG Reviewed: none available  12:35 AM Lab workup is negative.  She is in CT to have her abdominal and pelvic CT.  1:26 AM CT of abdomen and pelvis showed no serious cause for her pain.  The CT showed a small stone in the right kidney.   Rx hydrocodone-acetaminophen for pain, Phenergan for nausea.  F/U with Dr. Sarajane Jews, her PCP.   1. Abdominal pain        Mylinda Latina III, MD 02/26/13 [phone]

## 2013-02-25 NOTE — ED Notes (Signed)
Patient sitting up on stretcher at this time drinking ct contrast. Patient states she is feeling much better at this time. Pain was relieved some. Patient is more talkative. Family in room with patient. Call light at bedside. Will continue to monitor.

## 2013-02-25 NOTE — ED Notes (Signed)
PT. REPORTS WOKE UP THIS MORNING WITH RLQ PAIN SEEN AT [facility] THIS EVENING ADVISED TO GO TO ER FOR FURTHER EVALUATION . DENIES VOMITTING OR DIARRHEA . SLIGHT NAUSEA.

## 2013-02-25 NOTE — ED Notes (Signed)
Patient sitting on stretcher at this time. Drinking ct contrast, tolerating well. Patient states she is going good at this time. Patient is in no acute distress, resp are even and unlabored. Plan of care discussed with patient. Call light at bedside with patient. Will continue to monitor.

## 2013-02-26 ENCOUNTER — Emergency Department (HOSPITAL_COMMUNITY): Payer: Medicare Other

## 2013-02-26 MED ORDER — HYDROCODONE-ACETAMINOPHEN 5-325 MG PO TABS: 1.0000 | ORAL_TABLET | ORAL | Status: DC | PRN

## 2013-02-26 MED ORDER — PROMETHAZINE HCL 25 MG PO TABS: 25.0000 mg | ORAL_TABLET | Freq: Four times a day (QID) | ORAL | Status: DC | PRN

## 2013-02-26 NOTE — ED Notes (Signed)
Patient given discharge instructions for abdominal pain. rx for norco and phenergan. Advised to follow up with primary care. Patient voiced understanding of all instructions and had no further questions. Patient taken to lobby via wheelchair.

## 2013-02-26 NOTE — ED Notes (Signed)
CT notified patient has finished contrast.

## 2013-03-01 ENCOUNTER — Telehealth: Payer: Self-pay | Admitting: Family Medicine

## 2013-03-01 ENCOUNTER — Telehealth: Payer: Self-pay | Admitting: Gastroenterology

## 2013-03-01 MED ORDER — LEVOTHYROXINE SODIUM 88 MCG PO TABS: 88.0000 ug | ORAL_TABLET | Freq: Every day | ORAL | Status: DC

## 2013-03-01 NOTE — Telephone Encounter (Signed)
Pt needs new rx  levothyroxine 88 mcg sent to [facility] [address].

## 2013-03-01 NOTE — Telephone Encounter (Signed)
Patient advised that she should see Dr. Sarajane Jews as ER MD recommended.  She can call here for an appt if they thought this is GI related

## 2013-03-01 NOTE — Telephone Encounter (Signed)
Rx sent to the pharmacy noted pt needs follow up visit.

## 2013-03-18 HISTORY — PX: NM MYOVIEW LTD: HXRAD82

## 2013-04-12 ENCOUNTER — Telehealth: Payer: Self-pay | Admitting: Cardiology

## 2013-04-12 MED ORDER — LOSARTAN POTASSIUM 25 MG PO TABS: 25.0000 mg | ORAL_TABLET | Freq: Every day | ORAL | Status: DC

## 2013-04-12 MED ORDER — TELMISARTAN 80 MG PO TABS: 80.0000 mg | ORAL_TABLET | Freq: Every day | ORAL | Status: DC

## 2013-04-12 NOTE — Telephone Encounter (Signed)
Returned call.  Left message to call back before 4pm.  

## 2013-04-12 NOTE — Telephone Encounter (Signed)
If her BP is currently in 140s & not on Losartan, we will need to reduce dose to 25 mg.  - lets send of Rx for 25 mg only -- to take ~lunch time.  Would tolerate 160s more than 90s.  [name], MD

## 2013-04-12 NOTE — Telephone Encounter (Signed)
Blood been up and down the past week- Its been 168/68 and 92/52-It doesn't make her feel that good-need advice about her medicine-Also been waiting  For her medicine since last Wednesday-Please call in it today-She is completely out of it!

## 2013-04-12 NOTE — Telephone Encounter (Signed)
Pt called back.  Pt with concerns about refill on micardis.  Stated she called her pharmacy last Wednesday and still hasn't received it.  Pt stated her BP dropped to 92/52 and highest was 168/68.  Stated when she called in April she was told to just stop her medicine and when it gets back to 130/80 to restart.  Pt stated she feels weak when it drops low.  BP today was 142/66 and stated she feels better.  Pt stated she is concerned about it fluctuating so much.  Pt informed Dr. Ellyn Hack will be notified for further instructions regarding her BP and her refill will be sent to pharmacy once paper chart reviewed.  Pt verbalized understanding and agreed w/ plan.  Message forwarded to Dr. Ellyn Hack for advice re: BP.

## 2013-04-12 NOTE — Telephone Encounter (Signed)
Go with 25mg  Losartan & ask her to stop Clonidine. [name], MD   Call to pt and informed.  Pt verbalized understanding.  Rxs sent to [facility].

## 2013-05-20 ENCOUNTER — Telehealth: Payer: Self-pay | Admitting: *Deleted

## 2013-05-20 NOTE — Telephone Encounter (Signed)
Spoke to patient. Per [name]- stop the Losartan. Continue chlorthalidone in the morning before breakfast. Take her B/P prior to taking Chlorthalidone if <110 take Amlodipine at lunch time if greater  than 110 then take amlodipine w/chlorthalidone.    Take Micardis at dinner along with her Aspirin.  Continue keeping b/p diary and bring it by 1-2 months. Pt verbalized understanding.

## 2013-05-29 ENCOUNTER — Other Ambulatory Visit: Payer: Self-pay | Admitting: Gastroenterology

## 2013-05-31 ENCOUNTER — Other Ambulatory Visit: Payer: Self-pay | Admitting: Family Medicine

## 2013-05-31 NOTE — Telephone Encounter (Signed)
NEEDS OFFICE VISIT FOR ANY FURTHER REFILLS! 

## 2013-06-01 NOTE — Telephone Encounter (Signed)
Call in #30 with 5 rf 

## 2013-06-07 ENCOUNTER — Other Ambulatory Visit: Payer: Self-pay | Admitting: Family Medicine

## 2013-06-07 NOTE — Telephone Encounter (Signed)
Can we refill this? 

## 2013-06-17 ENCOUNTER — Telehealth: Payer: Self-pay | Admitting: Gastroenterology

## 2013-06-17 NOTE — Telephone Encounter (Signed)
Patient states about 3 times in the last 2 months, she has been eating and the "food stops 1/2 way down." She states she can drink okay and the food eventually goes on down. Offered OV with extender but she prefers Dr. Fuller Plan. Scheduled on 07/06/13 at 2:15 PM. Patient understands to call if problem gets worse and will schedule with extender.

## 2013-07-06 ENCOUNTER — Encounter: Payer: Self-pay | Admitting: Gastroenterology

## 2013-07-06 ENCOUNTER — Ambulatory Visit (INDEPENDENT_AMBULATORY_CARE_PROVIDER_SITE_OTHER): Payer: Medicare Other | Admitting: Gastroenterology

## 2013-07-06 VITALS — BP 138/84 | HR 61 | Ht 64.0 in | Wt 154.8 lb

## 2013-07-06 DIAGNOSIS — K219 Gastro-esophageal reflux disease without esophagitis: Secondary | ICD-10-CM

## 2013-07-06 DIAGNOSIS — R1319 Other dysphagia: Secondary | ICD-10-CM

## 2013-07-06 MED ORDER — OMEPRAZOLE 20 MG PO CPDR: 20.0000 mg | DELAYED_RELEASE_CAPSULE | Freq: Every day | ORAL | Status: DC

## 2013-07-06 NOTE — Progress Notes (Signed)
History of Present Illness: This is an 79 year old female who noticed the onset of dysphagia on 3 or 4 occasions over the past 2-3 months. She describes something sticking at the base of her neck, primarily when eating rice, and the symptoms resolve promptly when she swallows water. She notes a raw sensation in the back of her throat for the past few weeks and also has noted several episodes of heartburn.  Current Medications, Allergies, Past Medical History, Past Surgical History, Family History and Social History were reviewed in [employer] record.  Physical Exam: General: Well developed , well nourished, no acute distress Head: Normocephalic and atraumatic Eyes:  sclerae anicteric, EOMI Ears: Normal auditory acuity Mouth: No deformity or lesions Lungs: Clear throughout to auscultation Heart: Regular rate and rhythm; no murmurs, rubs or bruits Abdomen: Soft, non tender and non distended. No masses, hepatosplenomegaly or hernias noted. Normal Bowel sounds Musculoskeletal: Symmetrical with no gross deformities  Pulses:  Normal pulses noted Extremities: No clubbing, cyanosis, edema or deformities noted Neurological: Alert oriented x 4, grossly nonfocal Psychological:  Alert and cooperative. Normal mood and affect  Assessment and Recommendations:  1. GERD and solid food dysphagia. Rule out esophageal strictures and motility disorders. Begin omeprazole 20 mg daily and antireflux measures. Schedule barium esophagram. I offered the patient the option to schedule upper endoscopy following the barium esophagram however she would like to wait for the results of the barium study prior to considering upper endoscopy. This is reasonable.

## 2013-07-06 NOTE — Patient Instructions (Addendum)
We have sent the following medications to your pharmacy for you to pick up at your convenience: Omeprazole.  Patient advised to avoid spicy, acidic, citrus, chocolate, mints, fruit and fruit juices.  Limit the intake of caffeine, alcohol and Soda.  Don't exercise too soon after eating.  Don't lie down within 3-4 hours of eating.  Elevate the head of your bed.   You have been scheduled for a Barium Esophogram at [facility] (1st floor of the hospital) on 07/15/13 at 10:45am. Please arrive 15 minutes prior to your appointment for registration. Make certain not to have anything to eat or drink 6 hours prior to your test. If you need to reschedule for any reason, please contact radiology at [phone] to do so. __________________________________________________________________ A barium swallow is an examination that concentrates on views of the esophagus. This tends to be a double contrast exam (barium and two liquids which, when combined, create a gas to distend the wall of the oesophagus) or single contrast (non-ionic iodine based). The study is usually tailored to your symptoms so a good history is essential. Attention is paid during the study to the form, structure and configuration of the esophagus, looking for functional disorders (such as aspiration, dysphagia, achalasia, motility and reflux) EXAMINATION You may be asked to change into a gown, depending on the type of swallow being performed. A radiologist and radiographer will perform the procedure. The radiologist will advise you of the type of contrast selected for your procedure and direct you during the exam. You will be asked to stand, sit or lie in several different positions and to hold a small amount of fluid in your mouth before being asked to swallow while the imaging is performed .In some instances you may be asked to swallow barium coated marshmallows to assess the motility of a solid food bolus. The exam can be recorded as a  digital or video fluoroscopy procedure. POST PROCEDURE It will take 1-2 days for the barium to pass through your system. To facilitate this, it is important, unless otherwise directed, to increase your fluids for the next 24-48hrs and to resume your normal diet.  This test typically takes about 30 minutes to perform. __________________________________________________________________________________  It has been recommended to you by your physician that you have a(n) Endoscopy completed. Per your request, we did not schedule the procedure(s) today. Please contact our office at [phone] should you decide to have the procedure completed.  Thank you for choosing me and [facility].  Pricilla Riffle. Dagoberto Ligas., MD., [name]

## 2013-07-12 ENCOUNTER — Other Ambulatory Visit: Payer: Self-pay | Admitting: Gastroenterology

## 2013-07-15 ENCOUNTER — Ambulatory Visit (HOSPITAL_COMMUNITY)
Admission: RE | Admit: 2013-07-15 | Discharge: 2013-07-15 | Disposition: A | Discharge status: Home / Self Care | Payer: Medicare Other | Source: Ambulatory Visit | Attending: Gastroenterology | Admitting: Gastroenterology

## 2013-07-15 DIAGNOSIS — K449 Diaphragmatic hernia without obstruction or gangrene: Secondary | ICD-10-CM | POA: Insufficient documentation

## 2013-07-15 DIAGNOSIS — R1310 Dysphagia, unspecified: Secondary | ICD-10-CM | POA: Insufficient documentation

## 2013-07-15 DIAGNOSIS — R1319 Other dysphagia: Secondary | ICD-10-CM

## 2013-07-15 DIAGNOSIS — K219 Gastro-esophageal reflux disease without esophagitis: Secondary | ICD-10-CM

## 2013-07-16 ENCOUNTER — Telehealth: Payer: Self-pay | Admitting: Gastroenterology

## 2013-07-16 NOTE — Telephone Encounter (Signed)
Spoke with the patient's daughter-in-law.  She now understands the endo with dilation and will speak with the patient.  They will call back for any questions

## 2013-08-04 ENCOUNTER — Ambulatory Visit (AMBULATORY_SURGERY_CENTER): Payer: Self-pay | Admitting: *Deleted

## 2013-08-04 VITALS — Ht 64.0 in | Wt 154.2 lb

## 2013-08-04 DIAGNOSIS — R1319 Other dysphagia: Secondary | ICD-10-CM

## 2013-08-04 NOTE — Progress Notes (Signed)
Patient denies any allergies to eggs or soy. Patient denies any problems with anesthesia.  

## 2013-08-05 ENCOUNTER — Encounter: Payer: Self-pay | Admitting: Cardiology

## 2013-08-05 ENCOUNTER — Ambulatory Visit (INDEPENDENT_AMBULATORY_CARE_PROVIDER_SITE_OTHER): Payer: Medicare Other | Admitting: Cardiology

## 2013-08-05 VITALS — BP 144/66 | HR 54 | Ht 64.0 in | Wt 151.2 lb

## 2013-08-05 DIAGNOSIS — N183 Chronic kidney disease, stage 3 unspecified: Secondary | ICD-10-CM

## 2013-08-05 DIAGNOSIS — K219 Gastro-esophageal reflux disease without esophagitis: Secondary | ICD-10-CM

## 2013-08-05 DIAGNOSIS — I10 Essential (primary) hypertension: Secondary | ICD-10-CM

## 2013-08-05 DIAGNOSIS — R001 Bradycardia, unspecified: Secondary | ICD-10-CM

## 2013-08-05 DIAGNOSIS — I739 Peripheral vascular disease, unspecified: Secondary | ICD-10-CM | POA: Insufficient documentation

## 2013-08-05 DIAGNOSIS — I498 Other specified cardiac arrhythmias: Secondary | ICD-10-CM

## 2013-08-05 NOTE — Assessment & Plan Note (Signed)
Last SCr 1.6 

## 2013-08-05 NOTE — Assessment & Plan Note (Signed)
B/P pretty well controlled.

## 2013-08-05 NOTE — Assessment & Plan Note (Signed)
Bilat carotid bruits, no hx of TIA or CVA

## 2013-08-05 NOTE — Assessment & Plan Note (Signed)
She is to have esophageal dilatation next week with Dr Jacinto Halim

## 2013-08-05 NOTE — Patient Instructions (Addendum)
Your physician wants you to follow-up in: 6 months with Dr. Ellyn Hack. You will receive a reminder letter in the mail two months in advance. If you don't receive a letter, please call our office to schedule the follow-up appointment.

## 2013-08-05 NOTE — Progress Notes (Signed)
08/05/2013 [name]   1923-12-31  [id number]  Primary Physicia [name], MD Primary Cardiologist: [name]  HPI:  79 y/o with a history of HTN. She had seen Dr Dannielle Burn in 2002 and had an MRA that revealed no significant RAS. She was followed by Dr Warren Lacy and then a cardiologist at [facility]. He apparently went out on medical leave and the patient now sees [name]. She has no history of MI or CAD. We don't have any records of an echo or Myoview available. She has PVD and had dopplers in 2009 showing moderate, (123456) LICA stenosis and XX123456 RICA stenosis. She has no history of CVA or TIA. She does have CRI, her last SCr was 1.14 February 2013.    Current Outpatient Prescriptions  Medication Sig Dispense Refill  . amLODipine (NORVASC) 5 MG tablet Take 2.5 mg by mouth daily.       Marland Kitchen aspirin 81 MG tablet Take 81 mg by mouth daily.        . chlorthalidone (HYGROTON) 50 MG tablet Take 1 tablet by mouth every morning.       . hyoscyamine (LEVSIN, ANASPAZ) 0.125 MG tablet TAKE 1 TABLET BY MOUTH 4 TIMES A DAY BEFORE MEALS AND AT BEDTIME  120 tablet  11  . levothyroxine (SYNTHROID, LEVOTHROID) 88 MCG tablet TAKE 1 TABLET BY MOUTH EVERY DAY  90 tablet  0  . telmisartan (MICARDIS) 80 MG tablet Take 1 tablet (80 mg total) by mouth daily.  30 tablet  6  . temazepam (RESTORIL) 30 MG capsule Take 1 capsule by mouth daily.       No current facility-administered medications for this visit.    Allergies  Allergen Reactions  . Amoxicillin     REACTION: unspecified  . Codeine Phosphate     REACTION: unspecified  . Penicillins     REACTION: nausea, swelling  . Xifaxan [Rifaximin] Other (See Comments)    unknown    History   Social History  . Marital Status: Widowed    Spouse Name: N/A    Number of Children: N/A  . Years of Education: N/A   Occupational History  . Not on file.   Social History Main Topics  . Smoking status: Never Smoker   . Smokeless tobacco: Never Used  .  Alcohol Use: Yes     Comment: social wine intake  . Drug Use: No  . Sexual Activity: Not on file   Other Topics Concern  . Not on file   Social History Narrative  . No narrative on file     Review of Systems: General: negative for chills, fever, night sweats or weight changes.  Cardiovascular: negative for chest pain, dyspnea on exertion, edema, orthopnea, palpitations, paroxysmal nocturnal dyspnea or shortness of breath Dermatological: negative for rash Respiratory: negative for cough or wheezing Urologic: negative for hematuria Abdominal: negative for nausea, vomiting, diarrhea, bright red blood per rectum, melena, or hematemesis positive for difficulty swallowing Neurologic: negative for visual changes, syncope, or dizziness All other systems reviewed and are otherwise negative except as noted above.    Blood pressure 144/66, pulse 54, height 5\' 4"  (1.626 m), weight 151 lb 3.2 oz (68.584 kg).  General appearance: alert, cooperative and no distress Neck: soft bilat carotid bruits Lungs: clear to auscultation bilaterally and scoliosis Heart: regular rate and rhythm Extremities: no edema  EKG NSR, SB  ASSESSMENT AND PLAN:   HYPERTENSION- no significant RAS by MRA 2002- (<30% LRAS) B/P pretty well controlled.  Sinus bradycardia- asymptomatic .  Chronic renal insufficiency, stage III (moderate) Last SCr 1.6  PVD- 123456 LICA, [id number] RICA by doppler 2009 Bilat carotid bruits, no hx of TIA or CVA  GERD (gastroesophageal reflux disease) She is to have esophageal dilatation next week with Dr Jacinto Halim   PLAN  Her B/P is currently well controlled. I am a little concerned about her SCr and will ask that she gets this re checked. If her SCr continues to climb we may have to adjust her medications. She can follow up with [name] in 6 months if this is stable, if not I will review it with him.  Sayid Moll KPA-C 08/05/2013 11:44 AM

## 2013-08-09 ENCOUNTER — Encounter: Payer: Self-pay | Admitting: Gastroenterology

## 2013-08-11 ENCOUNTER — Ambulatory Visit: Payer: Medicare Other | Admitting: Cardiology

## 2013-08-12 ENCOUNTER — Encounter: Payer: Self-pay | Admitting: Gastroenterology

## 2013-08-12 ENCOUNTER — Ambulatory Visit (AMBULATORY_SURGERY_CENTER): Payer: Medicare Other | Admitting: Gastroenterology

## 2013-08-12 VITALS — BP 149/75 | HR 48 | Temp 97.8°F | Resp 20 | Ht 64.0 in | Wt 154.0 lb

## 2013-08-12 DIAGNOSIS — K219 Gastro-esophageal reflux disease without esophagitis: Secondary | ICD-10-CM

## 2013-08-12 DIAGNOSIS — R1319 Other dysphagia: Secondary | ICD-10-CM

## 2013-08-12 DIAGNOSIS — K2990 Gastroduodenitis, unspecified, without bleeding: Secondary | ICD-10-CM

## 2013-08-12 DIAGNOSIS — K2970 Gastritis, unspecified, without bleeding: Secondary | ICD-10-CM

## 2013-08-12 DIAGNOSIS — K2960 Other gastritis without bleeding: Secondary | ICD-10-CM

## 2013-08-12 DIAGNOSIS — K222 Esophageal obstruction: Secondary | ICD-10-CM

## 2013-08-12 MED ORDER — OMEPRAZOLE 40 MG PO CPDR
40.0000 mg | DELAYED_RELEASE_CAPSULE | Freq: Every day | ORAL | Status: DC
Start: 2013-08-12 — End: 2013-12-29

## 2013-08-12 MED ORDER — SODIUM CHLORIDE 0.9 % IV SOLN: 500.0000 mL | INTRAVENOUS | Status: DC

## 2013-08-12 NOTE — Progress Notes (Signed)
Propofol given over incremental dosages 

## 2013-08-12 NOTE — Op Note (Signed)
[place]  [address]. [place], [id number]   ENDOSCOPY PROCEDURE REPORT  PATIENT: [name], [name]  MR#: [id number] BIRTHDATE: 1924-07-21 , 88  yrs. old GENDER: Female ENDOSCOPIST: Ladene Artist, MD, [facility] PROCEDURE DATE:  08/12/2013 PROCEDURE:   EGD with dilatation over guidewire and EGD with biopsy  ASA CLASS:   Class III INDICATIONS:dysphagia and xray showed stricture. MEDICATIONS: MAC sedation, administered by CRNA and propofol (Diprivan) 60mg  IV TOPICAL ANESTHETIC:   Cetacaine Spray DESCRIPTION OF PROCEDURE:   After the risks benefits and alternatives of the procedure were thoroughly explained, informed consent was obtained.  The     endoscope was introduced through the mouth  and advanced to the descending duodenum ,      The instrument was slowly withdrawn as the mucosa was carefully examined.  ESOPHAGUS: A stricture was found at the gastroesophageal junction. It measured 12 mm in diameter. The stenosis was traversable with the endoscope. LA Class Grade A esophagitis.  The esophagus was tortuous.   The esophagus was otherwise normal. STOMACH: Moderate erosive gastritis was found in the gastric antrum. Multiple biopsies were performed.  The stomach otherwise appeared normal.  A hiatus hernia was found in the fundus/cardia. It measured 5 cm. Multiple cameron erosions were found. DUODENUM: The duodenal mucosa showed no abnormalities in the bulb and second portion of the duodenum. Dilation was then performed at the gastroesphageal junction. Dilator:Savary over guidewire Size:13, 14, 15 mm Reststance:minimal Heme:yes, mininal, on last dilator  COMPLICATIONS: There were no complications. ENDOSCOPIC IMPRESSION: 1.   Stricture at the gastroesophageal junction 2.   The esophagus was tortuous 3.   LA Class Grade A esophagitis 4.   Erosive gastritis in the gastric antrum; multiple biopsies 6.   Hiatus hernia with [name] erosions in the  fundus/cardia  RECOMMENDATIONS: 1.  anti-reflux regimen long term 2.  await pathology results 3.  PPI qam long term: omeprazole 40 mg po qam, 1 year of refills 4.  post dilation instructions  eSigned:  Ladene Artist, MD, [facility] 08/12/2013 11:50 AM

## 2013-08-12 NOTE — Patient Instructions (Addendum)
Discharge instructions given with verbal understanding. Handouts on stricture,hiatal hernia and a dilatation diet. Resume previous medications. YOU HAD AN ENDOSCOPIC PROCEDURE TODAY AT [facility]: Refer to the procedure report that was given to you for any specific questions about what was found during the examination.  If the procedure report does not answer your questions, please call your gastroenterologist to clarify.  If you requested that your care partner not be given the details of your procedure findings, then the procedure report has been included in a sealed envelope for you to review at your convenience later.  YOU SHOULD EXPECT: Some feelings of bloating in the abdomen. Passage of more gas than usual.  Walking can help get rid of the air that was put into your GI tract during the procedure and reduce the bloating. If you had a lower endoscopy (such as a colonoscopy or flexible sigmoidoscopy) you may notice spotting of blood in your stool or on the toilet paper. If you underwent a bowel prep for your procedure, then you may not have a normal bowel movement for a few days.  DIET: Your first meal following the procedure should be a light meal and then it is ok to progress to your normal diet.  A half-sandwich or bowl of soup is an example of a good first meal.  Heavy or fried foods are harder to digest and may make you feel nauseous or bloated.  Likewise meals heavy in dairy and vegetables can cause extra gas to form and this can also increase the bloating.  Drink plenty of fluids but you should avoid alcoholic beverages for 24 hours.  ACTIVITY: Your care partner should take you home directly after the procedure.  You should plan to take it easy, moving slowly for the rest of the day.  You can resume normal activity the day after the procedure however you should NOT DRIVE or use heavy machinery for 24 hours (because of the sedation medicines used during the test).    SYMPTOMS  TO REPORT IMMEDIATELY: A gastroenterologist can be reached at any hour.  During normal business hours, 8:30 AM to 5:00 PM Monday through Friday, call [phone].  After hours and on weekends, please call the GI answering service at [phone] who will take a message and have the physician on call contact you.   Following upper endoscopy (EGD)  Vomiting of blood or coffee ground material  New chest pain or pain under the shoulder blades  Painful or persistently difficult swallowing  New shortness of breath  Fever of 100F or higher  Black, tarry-looking stools  FOLLOW UP: If any biopsies were taken you will be contacted by phone or by letter within the next 1-3 weeks.  Call your gastroenterologist if you have not heard about the biopsies in 3 weeks.  Our staff will call the home number listed on your records the next business day following your procedure to check on you and address any questions or concerns that you may have at that time regarding the information given to you following your procedure. This is a courtesy call and so if there is no answer at the home number and we have not heard from you through the emergency physician on call, we will assume that you have returned to your regular daily activities without incident.  SIGNATURES/CONFIDENTIALITY: You and/or your care partner have signed paperwork which will be entered into your electronic medical record.  These signatures attest to the fact that that  the information above on your After Visit Summary has been reviewed and is understood.  Full responsibility of the confidentiality of this discharge information lies with you and/or your care-partner.

## 2013-08-12 NOTE — Progress Notes (Signed)
Called to room to assist during endoscopic procedure.  Patient ID and intended procedure confirmed with present staff. Received instructions for my participation in the procedure from the performing physician.  

## 2013-08-12 NOTE — Progress Notes (Signed)
Patient did not experience any of the following events: a burn prior to discharge; a fall within the facility; wrong site/side/patient/procedure/implant event; or a hospital transfer or hospital admission upon discharge from the facility. (G8907) Patient did not have preoperative order for IV antibiotic SSI prophylaxis. (G8918)  

## 2013-08-13 ENCOUNTER — Telehealth: Payer: Self-pay | Admitting: *Deleted

## 2013-08-13 NOTE — Telephone Encounter (Signed)
  Follow up Call-  Call back number 08/12/2013 05/04/2012  Post procedure Call Back phone  # [phone]  Permission to leave phone message Yes Yes     Patient questions:  Do you have a fever, pain , or abdominal swelling? no Pain Score  0 *  Have you tolerated food without any problems? yes  Have you been able to return to your normal activities? yes  Do you have any questions about your discharge instructions: Diet   no Medications  no Follow up visit  no  Do you have questions or concerns about your Care? no  Actions: * If pain score is 4 or above: No action needed, pain <4.

## 2013-08-25 ENCOUNTER — Encounter: Payer: Self-pay | Admitting: Gastroenterology

## 2013-08-31 ENCOUNTER — Other Ambulatory Visit: Payer: Self-pay | Admitting: Family Medicine

## 2013-08-31 ENCOUNTER — Ambulatory Visit (INDEPENDENT_AMBULATORY_CARE_PROVIDER_SITE_OTHER): Payer: Medicare Other

## 2013-08-31 DIAGNOSIS — Z23 Encounter for immunization: Secondary | ICD-10-CM

## 2013-09-01 NOTE — Telephone Encounter (Signed)
Can we refill this? 

## 2013-09-02 ENCOUNTER — Encounter: Payer: Self-pay | Admitting: Internal Medicine

## 2013-09-02 ENCOUNTER — Ambulatory Visit (INDEPENDENT_AMBULATORY_CARE_PROVIDER_SITE_OTHER): Payer: Medicare Other | Admitting: Internal Medicine

## 2013-09-02 VITALS — BP 140/70 | HR 67 | Temp 98.1°F | Wt 157.2 lb

## 2013-09-02 DIAGNOSIS — S8010XA Contusion of unspecified lower leg, initial encounter: Secondary | ICD-10-CM

## 2013-09-02 DIAGNOSIS — S8012XA Contusion of left lower leg, initial encounter: Secondary | ICD-10-CM

## 2013-09-02 NOTE — Progress Notes (Signed)
Subjective:    Patient ID: [name], female    DOB: 1924/02/09, 79 y.o.   MRN: [id number]  HPI  Pt presents to the clinic today with c/o leg pain. This started 1 week ago when she hit it on a tram step. Since that time, she has noticed swelling, bruising and pain. A lumps has developed over her shin bone. She has not put anything on it. She has taken tylenol for the pain.  Review of Systems      Past Medical History  Diagnosis Date  . Hyperlipidemia   . Hypothyroidism   . Arthritis   . Anemia   . Asthma   . Anxiety   . IBS (irritable bowel syndrome)   . Gout   . Colon polyps   . Tubulovillous adenoma 4/07  . Hemorrhoids   . Diverticulosis   . Hypertension     Labile, difficult-to-control, at least stage 2  . Renal insufficiency, mild     Creatinine 1.5    Current Outpatient Prescriptions  Medication Sig Dispense Refill  . amLODipine (NORVASC) 5 MG tablet Take 2.5 mg by mouth daily.       Marland Kitchen aspirin 81 MG tablet Take 81 mg by mouth daily.        . chlorthalidone (HYGROTON) 50 MG tablet Take 1 tablet by mouth every morning.       . hyoscyamine (LEVSIN, ANASPAZ) 0.125 MG tablet TAKE 1 TABLET BY MOUTH 4 TIMES A DAY BEFORE MEALS AND AT BEDTIME  120 tablet  11  . levothyroxine (SYNTHROID, LEVOTHROID) 88 MCG tablet TAKE 1 TABLET BY MOUTH EVERY DAY  90 tablet  0  . omeprazole (PRILOSEC) 40 MG capsule Take 1 capsule (40 mg total) by mouth daily.  30 capsule  11  . telmisartan (MICARDIS) 80 MG tablet Take 1 tablet (80 mg total) by mouth daily.  30 tablet  6  . temazepam (RESTORIL) 30 MG capsule Take 1 capsule by mouth daily.       No current facility-administered medications for this visit.    Allergies  Allergen Reactions  . Amoxicillin     REACTION: unspecified  . Codeine Phosphate     REACTION: unspecified  . Penicillins     REACTION: nausea, swelling  . Xifaxan [Rifaximin] Other (See Comments)    unknown    Family History  Problem Relation Age of Onset  .  Cancer Mother 71  . Heart attack Father   . Heart disease Brother   . Stroke Maternal Grandmother   . Cancer Maternal Grandfather     Lung cancer  . Stroke Paternal Grandmother   . Cancer Daughter     History   Social History  . Marital Status: Widowed    Spouse Name: N/A    Number of Children: N/A  . Years of Education: N/A   Occupational History  . Not on file.   Social History Main Topics  . Smoking status: Never Smoker   . Smokeless tobacco: Never Used  . Alcohol Use: Yes     Comment: social wine intake  . Drug Use: No  . Sexual Activity: Not on file   Other Topics Concern  . Not on file   Social History Narrative  . No narrative on file     Constitutional: Denies fever, malaise, fatigue, headache or abrupt weight changes.  Musculoskeletal: Pt reports leg pain. Denies decrease in range of motion, difficulty with gait, muscle pain or joint pain and swelling.  Skin:  Pt reports bruising and swelling of left shin. Denies redness, rashes, lesions or ulcercations.  .   No other specific complaints in a complete review of systems (except as listed in HPI above).  Objective:   Physical Exam   BP 140/70  Pulse 67  Temp(Src) 98.1 F (36.7 C) (Oral)  Wt 157 lb 4 oz (71.328 kg)  BMI 26.98 kg/m2  SpO2 98% Wt Readings from Last 3 Encounters:  09/02/13 157 lb 4 oz (71.328 kg)  08/12/13 154 lb (69.854 kg)  08/05/13 151 lb 3.2 oz (68.584 kg)    General: Appears her stated age, well developed, well nourished in NAD. Skin: Warm, dry and intact. 3 cm x 2 cm hematoma noted on left shin- not infected Cardiovascular: Normal rate and rhythm. S1,S2 noted.  No murmur, rubs or gallops noted. No JVD or BLE edema. No carotid bruits noted. Pulmonary/Chest: Normal effort and positive vesicular breath sounds. No respiratory distress. No wheezes, rales or ronchi noted.  Musculoskeletal: Normal range of motion. No signs of joint swelling. No difficulty with gait.    BMET     Component Value Date/Time   NA 137 02/25/2013 2021   K 4.7 02/25/2013 2021   CL 102 02/25/2013 2021   CO2 21 02/25/2013 2021   GLUCOSE 160* 02/25/2013 2021   BUN 46* 02/25/2013 2021   CREATININE 1.65* 02/25/2013 2021   CALCIUM 9.8 02/25/2013 2021   GFRNONAA 27* 02/25/2013 2021   GFRAA 31* 02/25/2013 2021    Lipid Panel     Component Value Date/Time   CHOL 194 01/16/2010 1039   TRIG 80.0 01/16/2010 1039   HDL 58.30 01/16/2010 1039   CHOLHDL 3 01/16/2010 1039   VLDL 16.0 01/16/2010 1039   LDLCALC 120* 01/16/2010 1039    CBC    Component Value Date/Time   WBC 5.3 02/25/2013 2021   WBC 4.3 03/19/2006 0952   RBC 3.65* 02/25/2013 2021   RBC 3.64* 03/19/2006 0952   HGB 10.5* 02/25/2013 2021   HGB 11.4* 03/19/2006 0952   HCT 31.2* 02/25/2013 2021   HCT 33.5* 03/19/2006 0952   PLT 246 02/25/2013 2021   PLT 215 03/19/2006 0952   MCV 85.5 02/25/2013 2021   MCV 92.0 03/19/2006 0952   MCH 28.8 02/25/2013 2021   MCH 31.3 03/19/2006 0952   MCHC 33.7 02/25/2013 2021   MCHC 34.0 03/19/2006 0952   RDW 14.6 02/25/2013 2021   RDW 13.6 03/19/2006 0952   LYMPHSABS 0.5* 02/25/2013 2021   LYMPHSABS 1.2 03/19/2006 0952   MONOABS 0.2 02/25/2013 2021   MONOABS 0.5 03/19/2006 0952   EOSABS 0.0 02/25/2013 2021   EOSABS 0.1 03/19/2006 0952   BASOSABS 0.0 02/25/2013 2021   BASOSABS 0.0 03/19/2006 0952    Hgb A1C No results found for this basename: HGBA1C        Assessment & Plan:   Hematoma of left shin, secondary to injury:  Reassurance given that this will heal with time about 3-4 weeks Watch for increased redness, pain, swelling Ok to take tylenol for pain  RTC as needed

## 2013-09-02 NOTE — Patient Instructions (Signed)
Hematoma A hematoma is a pocket of blood that collects under the skin, in an organ, in a body space, in a joint space, or in other tissue. The blood can clot to form a lump that you can see and feel. The lump is often firm, sore, and sometimes even painful and tender. Most hematomas get better in a few days to weeks. However, some hematomas may be serious and require medical care.Hematomas can range in size from very small to very large. CAUSES  A hematoma can be caused by a blunt or penetrating injury. It can also be caused by leakage from a blood vessel under the skin. Spontaneous leakage from a blood vessel is more likely to occur in elderly people, especially those taking blood thinners. Sometimes, a hematoma can develop after certain medical procedures. SYMPTOMS  Unlike a bruise, a hematoma forms a firm lump that you can feel. This lump is the collection of blood. The collection of blood can also cause your skin to turn a blue to dark blue color. If the hematoma is close to the surface of the skin, it often produces a yellowish color in the skin. DIAGNOSIS  Your caregiver can determine whether you have a hematoma based on your history and a physical exam. TREATMENT  Hematomas usually go away on their own over time. Rarely does the blood need to be drained out of the body. HOME CARE INSTRUCTIONS   Put ice on the injured area.  Put ice in a plastic bag.  Place a towel between your skin and the bag.  Leave the ice on for 15-20 minutes, 3-4 times a day for the first 1 to 2 days.  After the first 2 days, switch to using warm compresses on the hematoma.  Elevate the injured area to help decrease pain and swelling. Wrapping the area with an elastic bandage may also be helpful. Compression helps to reduce swelling and promotes shrinking of the hematoma. Make sure the bandage is not wrapped too tight.  If your hematoma is on a lower extremity and is painful, crutches may be helpful for a couple  days.  Only take over-the-counter or prescription medicines for pain, discomfort, or fever as directed by your caregiver. Most patients can take acetaminophen or ibuprofen for the pain. SEEK IMMEDIATE MEDICAL CARE IF:   You have increasing pain, or your pain is not controlled with medicine.  You have a fever.  You have worsening swelling or discoloration.  Your skin over the hematoma breaks or starts bleeding. MAKE SURE YOU:   Understand these instructions.  Will watch your condition.  Will get help right away if you are not doing well or get worse. Document Released: 06/11/2004 Document Revised: 01/20/2012 Document Reviewed: 07/01/2011 [facility] Patient Information 2014 [place], [place].

## 2013-09-04 ENCOUNTER — Other Ambulatory Visit: Payer: Self-pay | Admitting: Family Medicine

## 2013-09-06 ENCOUNTER — Telehealth: Payer: Self-pay | Admitting: Family Medicine

## 2013-09-06 NOTE — Telephone Encounter (Signed)
Call in Synthroid #30 with no rf, also call in Lorazepam 0.5 mg to take q 8 hours prn anxiety, #60 with no rf

## 2013-09-06 NOTE — Telephone Encounter (Signed)
Pt states her rx for synthroid was denied and she doesn't know why.  I explained to pt rx was denied due to her needed an appt to see PCP.  Appt was scheduled 10/29 @ 10:45.  Pt requesting a 2 day supply of synthroid until she comes in Wednesday.   Pt also states her friend passed away recently and she is having a difficult time dealing with it. She states she was previously prescribed lorazepam but that rx has expired.  Pt is requesting a short term supply of this med.  Pharmacy [facility].

## 2013-09-07 MED ORDER — LORAZEPAM 0.5 MG PO TABS: 0.5000 mg | ORAL_TABLET | Freq: Three times a day (TID) | ORAL | Status: DC | PRN

## 2013-09-07 MED ORDER — LEVOTHYROXINE SODIUM 88 MCG PO TABS: 88.0000 ug | ORAL_TABLET | Freq: Every day | ORAL | Status: DC

## 2013-09-07 NOTE — Telephone Encounter (Signed)
I sent both scripts to pharmacy and spoke with pt.

## 2013-09-08 ENCOUNTER — Ambulatory Visit (INDEPENDENT_AMBULATORY_CARE_PROVIDER_SITE_OTHER): Payer: Medicare Other | Admitting: Family Medicine

## 2013-09-08 ENCOUNTER — Encounter: Payer: Self-pay | Admitting: Family Medicine

## 2013-09-08 VITALS — BP 140/62 | Temp 98.2°F | Wt 154.0 lb

## 2013-09-08 DIAGNOSIS — E039 Hypothyroidism, unspecified: Secondary | ICD-10-CM

## 2013-09-08 DIAGNOSIS — I10 Essential (primary) hypertension: Secondary | ICD-10-CM

## 2013-09-08 DIAGNOSIS — Z5189 Encounter for other specified aftercare: Secondary | ICD-10-CM

## 2013-09-08 DIAGNOSIS — S8012XD Contusion of left lower leg, subsequent encounter: Secondary | ICD-10-CM

## 2013-09-08 LAB — BASIC METABOLIC PANEL
BUN: 41 mg/dL — ABNORMAL HIGH (ref 6–23)
CO2: 23 mEq/L (ref 19–32)
Calcium: 9.2 mg/dL (ref 8.4–10.5)
Creatinine, Ser: 1.6 mg/dL — ABNORMAL HIGH (ref 0.4–1.2)
Glucose, Bld: 100 mg/dL — ABNORMAL HIGH (ref 70–99)

## 2013-09-08 LAB — CBC WITH DIFFERENTIAL/PLATELET
Basophils Absolute: 0 10*3/uL (ref 0.0–0.1)
Eosinophils Relative: 1.3 % (ref 0.0–5.0)
Lymphs Abs: 0.8 10*3/uL (ref 0.7–4.0)
Monocytes Absolute: 0.3 10*3/uL (ref 0.1–1.0)
Monocytes Relative: 9.9 % (ref 3.0–12.0)
Neutrophils Relative %: 64.7 % (ref 43.0–77.0)
Platelets: 193 10*3/uL (ref 150.0–400.0)
RDW: 14.7 % — ABNORMAL HIGH (ref 11.5–14.6)
WBC: 3.4 10*3/uL — ABNORMAL LOW (ref 4.5–10.5)

## 2013-09-08 LAB — LIPID PANEL
Cholesterol: 209 mg/dL — ABNORMAL HIGH (ref 0–200)
Total CHOL/HDL Ratio: 4
Triglycerides: 78 mg/dL (ref 0.0–149.0)

## 2013-09-08 LAB — POCT URINALYSIS DIPSTICK
Bilirubin, UA: NEGATIVE
Glucose, UA: NEGATIVE
Nitrite, UA: NEGATIVE
Protein, UA: NEGATIVE
Spec Grav, UA: 1.015
Urobilinogen, UA: 0.2

## 2013-09-08 LAB — TSH: TSH: 1.32 u[IU]/mL (ref 0.35–5.50)

## 2013-09-08 LAB — HEPATIC FUNCTION PANEL
ALT: 16 U/L (ref 0–35)
Albumin: 3.9 g/dL (ref 3.5–5.2)
Bilirubin, Direct: 0 mg/dL (ref 0.0–0.3)
Total Protein: 7.5 g/dL (ref 6.0–8.3)

## 2013-09-08 LAB — LDL CHOLESTEROL, DIRECT: Direct LDL: 142.3 mg/dL

## 2013-09-08 NOTE — Progress Notes (Signed)
  Subjective:    Patient ID: [name], female    DOB: 1924/07/20, 79 y.o.   MRN: [id number]  HPI Here to recheck an injury to the left lower leg and recheck her thyroid. About 2 weeks ago she struck the leg against the edge of a tram and developed a hematoma. She saw Cecille Po and was told this would resolve on its won. Indeed the lump is much smaller than it was but it is still tender. She is seeing Kerin Ransom PA for cardiologic care. She has not had a TSH checked for several years.    Review of Systems  Constitutional: Negative.   Respiratory: Negative.   Cardiovascular: Negative.   Endocrine: Negative.        Objective:   Physical Exam  Constitutional: She appears well-developed and well-nourished.  Neck: No thyromegaly present.  Cardiovascular: Normal rate, regular rhythm, normal heart sounds and intact distal pulses.   Pulmonary/Chest: Effort normal and breath sounds normal.  Musculoskeletal:  The left lower leg has a small firm tender hematoma. The initial abrasion has healed well. No erythema  Lymphadenopathy:    She has no cervical adenopathy.          Assessment & Plan:  The abrasion and hematoma are healing. Check labs today including a TSH.

## 2013-09-10 MED ORDER — FERROUS SULFATE 325 (65 FE) MG PO TABS: 325.0000 mg | ORAL_TABLET | Freq: Two times a day (BID) | ORAL | Status: DC

## 2013-09-10 NOTE — Progress Notes (Signed)
Quick Note:  I spoke with pt and [facility] ______

## 2013-09-10 NOTE — Addendum Note (Signed)
Addended by: Aggie Hacker A on: 09/10/2013 04:48 PM   Modules accepted: Orders

## 2013-09-23 ENCOUNTER — Ambulatory Visit (INDEPENDENT_AMBULATORY_CARE_PROVIDER_SITE_OTHER): Payer: Medicare Other

## 2013-09-23 ENCOUNTER — Encounter: Payer: Self-pay | Admitting: Podiatry

## 2013-09-23 ENCOUNTER — Ambulatory Visit (INDEPENDENT_AMBULATORY_CARE_PROVIDER_SITE_OTHER): Payer: Medicare Other | Admitting: Podiatry

## 2013-09-23 VITALS — BP 134/70 | HR 66 | Resp 14 | Ht 64.0 in | Wt 151.0 lb

## 2013-09-23 DIAGNOSIS — R52 Pain, unspecified: Secondary | ICD-10-CM

## 2013-09-23 DIAGNOSIS — M109 Gout, unspecified: Secondary | ICD-10-CM

## 2013-09-23 DIAGNOSIS — M779 Enthesopathy, unspecified: Secondary | ICD-10-CM

## 2013-09-23 MED ORDER — TRIAMCINOLONE ACETONIDE 10 MG/ML IJ SUSP
5.0000 mg | Freq: Once | INTRAMUSCULAR | Status: AC
  Administered 2013-09-23: 5 mg via INTRA_ARTICULAR

## 2013-09-23 MED ORDER — COLCHICINE 0.6 MG PO TABS: 0.6000 mg | ORAL_TABLET | Freq: Every day | ORAL | Status: DC

## 2013-09-23 NOTE — Progress Notes (Signed)
Subjective:     Patient ID: LORAINA LLANOS, female   DOB: 1924/01/21, 79 y.o.   MRN: [id number]  HPI patient presents stating the second toe on my right foot is a problem. States that it is very tender and makes it hard for her to wear shoes and has been present for several months   Review of Systems  All other systems reviewed and are negative.       Objective:   Physical Exam  Constitutional: She is oriented to person, place, and time.  Cardiovascular: Intact distal pulses.   Musculoskeletal: Normal range of motion.  Neurological: She is oriented to person, place, and time.  Skin: Skin is warm.   neurovascular status intact and muscle strength adequate for her age. Distal joint second toe right does have bulging consistent with inflammation and probable gouty attack     Assessment:     Inflammatory changes second toe right consistent with gout and inflammatory capsulitis    Plan:     H&P and x-rays reviewed. Start her on low dose: colchrys with instructions and did proximal nerve block of the second toe and then carefully injected a very small amount of dexamethasone Kenalog around the interphalangeal joint to give her relief of symptoms. Reappoint if any issues should occur

## 2013-09-30 ENCOUNTER — Encounter: Payer: Self-pay | Admitting: Cardiology

## 2013-10-06 ENCOUNTER — Other Ambulatory Visit: Payer: Self-pay | Admitting: Family Medicine

## 2013-10-06 NOTE — Telephone Encounter (Signed)
Refill for one year 

## 2013-10-19 ENCOUNTER — Telehealth: Payer: Self-pay | Admitting: Family Medicine

## 2013-10-19 NOTE — Telephone Encounter (Signed)
Pt dx w/ anemia in oct. Pt rx iron. Pt unable to take this rx. Makes her sick to stomach. Pt would like to know what else she should/could do?

## 2013-10-20 NOTE — Telephone Encounter (Signed)
I left a voice message with the below information.

## 2013-10-20 NOTE — Telephone Encounter (Signed)
Try taking only one tablet a day

## 2013-10-21 ENCOUNTER — Telehealth: Payer: Self-pay | Admitting: *Deleted

## 2013-10-21 MED ORDER — PREDNISONE (PAK) 10 MG PO TABS: ORAL_TABLET | ORAL | Status: DC

## 2013-10-21 NOTE — Telephone Encounter (Signed)
Pt complains of another outbreak of gout.  Dr Valentina Lucks ordered Sterapred DS (prednisone 10mg ) 6 days to be taken as a tapering dose.  I left a message [phone] and [phone] that [facility] [address].

## 2013-10-29 ENCOUNTER — Telehealth: Payer: Self-pay | Admitting: Gastroenterology

## 2013-10-29 MED ORDER — HYOSCYAMINE SULFATE 0.125 MG PO TABS: ORAL_TABLET | ORAL | Status: DC

## 2013-10-29 NOTE — Telephone Encounter (Signed)
Patient states she has been taking the Levsin 2 four times a day and has ran out early. Patient wants to know if we can send in a new prescription for her to take 2 tablets instead so she does not run out again. She states 2 tablets at a time really help with her symptoms when leaves the house. Told her that I can increase her medicine but to call back if her symptoms fail to improve after I have increased the dose. Pt verbalized understanding.

## 2013-11-01 ENCOUNTER — Encounter (HOSPITAL_COMMUNITY): Payer: Self-pay | Admitting: Emergency Medicine

## 2013-11-01 ENCOUNTER — Emergency Department (HOSPITAL_COMMUNITY): Payer: Medicare Other

## 2013-11-01 ENCOUNTER — Telehealth: Payer: Self-pay | Admitting: Family Medicine

## 2013-11-01 ENCOUNTER — Telehealth: Payer: Self-pay | Admitting: Cardiology

## 2013-11-01 ENCOUNTER — Observation Stay (HOSPITAL_COMMUNITY)
Admission: EM | Admit: 2013-11-01 | Discharge: 2013-11-03 | Disposition: A | Discharge status: Home / Self Care | Payer: Medicare Other | Attending: Internal Medicine | Admitting: Internal Medicine

## 2013-11-01 DIAGNOSIS — R079 Chest pain, unspecified: Principal | ICD-10-CM | POA: Insufficient documentation

## 2013-11-01 DIAGNOSIS — E039 Hypothyroidism, unspecified: Secondary | ICD-10-CM | POA: Insufficient documentation

## 2013-11-01 DIAGNOSIS — I498 Other specified cardiac arrhythmias: Secondary | ICD-10-CM | POA: Insufficient documentation

## 2013-11-01 DIAGNOSIS — I447 Left bundle-branch block, unspecified: Secondary | ICD-10-CM

## 2013-11-01 DIAGNOSIS — I4891 Unspecified atrial fibrillation: Secondary | ICD-10-CM | POA: Insufficient documentation

## 2013-11-01 DIAGNOSIS — I739 Peripheral vascular disease, unspecified: Secondary | ICD-10-CM | POA: Diagnosis present

## 2013-11-01 DIAGNOSIS — I480 Paroxysmal atrial fibrillation: Secondary | ICD-10-CM

## 2013-11-01 DIAGNOSIS — D649 Anemia, unspecified: Secondary | ICD-10-CM | POA: Insufficient documentation

## 2013-11-01 DIAGNOSIS — I129 Hypertensive chronic kidney disease with stage 1 through stage 4 chronic kidney disease, or unspecified chronic kidney disease: Secondary | ICD-10-CM | POA: Insufficient documentation

## 2013-11-01 DIAGNOSIS — F411 Generalized anxiety disorder: Secondary | ICD-10-CM | POA: Insufficient documentation

## 2013-11-01 DIAGNOSIS — R001 Bradycardia, unspecified: Secondary | ICD-10-CM

## 2013-11-01 DIAGNOSIS — I10 Essential (primary) hypertension: Secondary | ICD-10-CM | POA: Insufficient documentation

## 2013-11-01 DIAGNOSIS — I119 Hypertensive heart disease without heart failure: Secondary | ICD-10-CM | POA: Diagnosis present

## 2013-11-01 DIAGNOSIS — Z7982 Long term (current) use of aspirin: Secondary | ICD-10-CM | POA: Insufficient documentation

## 2013-11-01 DIAGNOSIS — R197 Diarrhea, unspecified: Secondary | ICD-10-CM | POA: Insufficient documentation

## 2013-11-01 DIAGNOSIS — K589 Irritable bowel syndrome without diarrhea: Secondary | ICD-10-CM | POA: Insufficient documentation

## 2013-11-01 DIAGNOSIS — N183 Chronic kidney disease, stage 3 unspecified: Secondary | ICD-10-CM | POA: Insufficient documentation

## 2013-11-01 DIAGNOSIS — R609 Edema, unspecified: Secondary | ICD-10-CM

## 2013-11-01 DIAGNOSIS — Z79899 Other long term (current) drug therapy: Secondary | ICD-10-CM | POA: Insufficient documentation

## 2013-11-01 DIAGNOSIS — I6529 Occlusion and stenosis of unspecified carotid artery: Secondary | ICD-10-CM | POA: Insufficient documentation

## 2013-11-01 DIAGNOSIS — M109 Gout, unspecified: Secondary | ICD-10-CM | POA: Insufficient documentation

## 2013-11-01 DIAGNOSIS — K219 Gastro-esophageal reflux disease without esophagitis: Secondary | ICD-10-CM | POA: Insufficient documentation

## 2013-11-01 DIAGNOSIS — R0789 Other chest pain: Secondary | ICD-10-CM | POA: Insufficient documentation

## 2013-11-01 DIAGNOSIS — I4819 Other persistent atrial fibrillation: Secondary | ICD-10-CM | POA: Diagnosis present

## 2013-11-01 DIAGNOSIS — E785 Hyperlipidemia, unspecified: Secondary | ICD-10-CM | POA: Diagnosis present

## 2013-11-01 HISTORY — DX: Left bundle-branch block, unspecified: I44.7

## 2013-11-01 HISTORY — DX: Paroxysmal atrial fibrillation: I48.0

## 2013-11-01 HISTORY — DX: Noninfective gastroenteritis and colitis, unspecified: K52.9

## 2013-11-01 LAB — URINALYSIS W MICROSCOPIC + REFLEX CULTURE
Bilirubin Urine: NEGATIVE
Hgb urine dipstick: NEGATIVE
Nitrite: NEGATIVE
Protein, ur: NEGATIVE mg/dL
Urine-Other: NONE SEEN
Urobilinogen, UA: 0.2 mg/dL (ref 0.0–1.0)

## 2013-11-01 LAB — COMPREHENSIVE METABOLIC PANEL
ALT: 11 U/L (ref 0–35)
AST: 23 U/L (ref 0–37)
Albumin: 3.5 g/dL (ref 3.5–5.2)
Calcium: 8.6 mg/dL (ref 8.4–10.5)
Chloride: 106 mEq/L (ref 96–112)
Creatinine, Ser: 1.62 mg/dL — ABNORMAL HIGH (ref 0.50–1.10)
Sodium: 139 mEq/L (ref 135–145)

## 2013-11-01 LAB — CBC WITH DIFFERENTIAL/PLATELET
Basophils Absolute: 0 10*3/uL (ref 0.0–0.1)
Basophils Relative: 0 % (ref 0–1)
Eosinophils Absolute: 0 10*3/uL (ref 0.0–0.7)
Eosinophils Relative: 0 % (ref 0–5)
HCT: 28.4 % — ABNORMAL LOW (ref 36.0–46.0)
Lymphocytes Relative: 16 % (ref 12–46)
MCH: 28.2 pg (ref 26.0–34.0)
MCHC: 32 g/dL (ref 30.0–36.0)
MCV: 87.9 fL (ref 78.0–100.0)
Monocytes Absolute: 0.5 10*3/uL (ref 0.1–1.0)
Neutrophils Relative %: 73 % (ref 43–77)
RDW: 15 % (ref 11.5–15.5)
WBC: 4.5 10*3/uL (ref 4.0–10.5)

## 2013-11-01 LAB — CBC
Platelets: 181 10*3/uL (ref 150–400)
RBC: 3.06 MIL/uL — ABNORMAL LOW (ref 3.87–5.11)
RDW: 15.1 % (ref 11.5–15.5)
WBC: 4 10*3/uL (ref 4.0–10.5)

## 2013-11-01 LAB — TSH: TSH: 0.607 u[IU]/mL (ref 0.350–4.500)

## 2013-11-01 LAB — LIPASE, BLOOD: Lipase: 51 U/L (ref 11–59)

## 2013-11-01 LAB — CREATININE, SERUM
Creatinine, Ser: 1.63 mg/dL — ABNORMAL HIGH (ref 0.50–1.10)
GFR calc Af Amer: 32 mL/min — ABNORMAL LOW (ref >90)
GFR calc non Af Amer: 27 mL/min — ABNORMAL LOW (ref >90)

## 2013-11-01 LAB — TROPONIN I
Troponin I: <0.3 ng/mL (ref <0.30)
Troponin I: <0.3 ng/mL (ref <0.30)

## 2013-11-01 MED ORDER — IRBESARTAN 150 MG PO TABS
150.0000 mg | ORAL_TABLET | Freq: Every day | ORAL | Status: DC
  Administered 2013-11-01 – 2013-11-03 (×2): 150 mg via ORAL
  Filled 2013-11-01 (×3): qty 1

## 2013-11-01 MED ORDER — ONDANSETRON HCL 4 MG PO TABS: 4.0000 mg | ORAL_TABLET | Freq: Four times a day (QID) | ORAL | Status: DC | PRN

## 2013-11-01 MED ORDER — ASPIRIN EC 325 MG PO TBEC
325.0000 mg | DELAYED_RELEASE_TABLET | Freq: Every day | ORAL | Status: DC
  Administered 2013-11-01 – 2013-11-03 (×2): 325 mg via ORAL
  Filled 2013-11-01 (×3): qty 1

## 2013-11-01 MED ORDER — SODIUM CHLORIDE 0.9 % IJ SOLN: 3.0000 mL | Freq: Two times a day (BID) | INTRAMUSCULAR | Status: DC

## 2013-11-01 MED ORDER — SODIUM CHLORIDE 0.9 % IV SOLN
INTRAVENOUS | Status: DC
  Administered 2013-11-01 – 2013-11-03 (×4): via INTRAVENOUS

## 2013-11-01 MED ORDER — HYDROCODONE-ACETAMINOPHEN 5-325 MG PO TABS: 1.0000 | ORAL_TABLET | Freq: Once | ORAL | Status: DC

## 2013-11-01 MED ORDER — PANTOPRAZOLE SODIUM 40 MG PO TBEC
40.0000 mg | DELAYED_RELEASE_TABLET | Freq: Every day | ORAL | Status: DC
  Administered 2013-11-01 – 2013-11-03 (×2): 40 mg via ORAL
  Filled 2013-11-01 (×3): qty 1

## 2013-11-01 MED ORDER — ENOXAPARIN SODIUM 30 MG/0.3ML ~~LOC~~ SOLN
30.0000 mg | SUBCUTANEOUS | Status: DC
  Administered 2013-11-01 – 2013-11-02 (×2): 30 mg via SUBCUTANEOUS
  Filled 2013-11-01 (×3): qty 0.3

## 2013-11-01 MED ORDER — METOPROLOL TARTRATE 50 MG PO TABS
50.0000 mg | ORAL_TABLET | Freq: Two times a day (BID) | ORAL | Status: DC
  Administered 2013-11-01: 50 mg via ORAL
  Filled 2013-11-01 (×3): qty 1

## 2013-11-01 MED ORDER — OXYCODONE HCL 5 MG PO TABS: 5.0000 mg | ORAL_TABLET | ORAL | Status: DC | PRN

## 2013-11-01 MED ORDER — ACETAMINOPHEN 650 MG RE SUPP: 650.0000 mg | Freq: Four times a day (QID) | RECTAL | Status: DC | PRN

## 2013-11-01 MED ORDER — ATORVASTATIN CALCIUM 40 MG PO TABS
40.0000 mg | ORAL_TABLET | Freq: Every day | ORAL | Status: DC
  Administered 2013-11-02: 40 mg via ORAL
  Filled 2013-11-01 (×3): qty 1

## 2013-11-01 MED ORDER — MORPHINE SULFATE 2 MG/ML IJ SOLN: 1.0000 mg | INTRAMUSCULAR | Status: DC | PRN

## 2013-11-01 MED ORDER — ONDANSETRON HCL 4 MG/2ML IJ SOLN: 4.0000 mg | Freq: Four times a day (QID) | INTRAMUSCULAR | Status: DC | PRN

## 2013-11-01 MED ORDER — ACETAMINOPHEN 325 MG PO TABS
650.0000 mg | ORAL_TABLET | Freq: Four times a day (QID) | ORAL | Status: DC | PRN
  Administered 2013-11-01: 650 mg via ORAL
  Filled 2013-11-01: qty 2

## 2013-11-01 MED ORDER — NITROGLYCERIN 0.4 MG SL SUBL: 0.4000 mg | SUBLINGUAL_TABLET | SUBLINGUAL | Status: DC | PRN

## 2013-11-01 NOTE — Telephone Encounter (Signed)
Please call-concerning her heart rate.

## 2013-11-01 NOTE — Telephone Encounter (Signed)
Returned call and pt verified x 2.  Pt stated she has been sick since Tuesday.  Pt c/o lower back pain that went up her back and between her shoulder blades.  Also c/o diarrhea.  Pt c/o tightness in chest w/ walking/moving.  Stated HR is in 53s and today 111.  Pt c/o chest tightness now and SOB that is a little more than usual.  Cecilie Kicks, NP notified and advised pt be seen in ER for evaluation.  Pt informed and verbalized understanding.  Did not want to go to ER, but agreed to have someone take her to ER.  Message forwarded to Dr. Ellyn Hack [facility]).

## 2013-11-01 NOTE — Telephone Encounter (Signed)
[facility] Triage Call Report Triage Record Num: [id number] Operator: Genice Rouge Patient Name: [name] Call Date & Time: 10/29/2013 6:35:39PM Patient Phone: [phone] PCP: Ishmael Holter. Sarajane Jews Patient Gender: Female PCP Fax : [phone] Patient DOB: May 14, 1924 Practice Name: [facility] Reason for Call: Caller: Milka/Patient; PCP: [name] (Family Practice); CB#: [phone]; Call regarding Back Pain; Kaylyne developed lower back pain on 10/26/13. Has since spread to upper back and shoulder blades. Has taken Aleve and Tylenol with no relief. Also using a heating pad. Pain is worse when standing or walking. Has felt tight in her chest over the past couple of days but not having those symptoms now. Afebrile. Utilized Back Symptoms Guideline. See PCP within 72 hrs d/t "mild to moderate pain in back with normal activity and not responding to 72hrs of home care". Parameters reviewed concerning when to call back. Protocol(s) Used: Back Symptoms Recommended Outcome per Protocol: See Provider within 72 Hours Reason for Outcome: Mild to moderate pain in back with normal activity or rest AND not responding to 72 hours of home care Care Advice: ~ Call provider if symptoms worsen or new symptoms develop. Apply a cloth-covered cold or ice pack to the area for 20 minutes 4 to 8 times a day for relief of pain for the first 24-48 hours. After 24 to 48 hours of cold application, use a cloth-covered heat pack to the area for 20 minutes 3 to 4 times a day. ~ 12/

## 2013-11-01 NOTE — Telephone Encounter (Signed)
Per Dr. Sarajane Jews, we can recheck labs 3 months from the time of last draw. I spoke with pt and she agreed.

## 2013-11-01 NOTE — Telephone Encounter (Signed)
I am not able to see her until next week. We can call in Tramdol 50 mg to take 1-2 tabs every 6 hours prn pain, #60 with 2 rf. If she needs to be seen before then, one of my partners can see her

## 2013-11-01 NOTE — Telephone Encounter (Signed)
Pt at [place] now.

## 2013-11-01 NOTE — Telephone Encounter (Signed)
Pt is not taking the iron pills. Pt would like to come in and have her blood recheck.

## 2013-11-01 NOTE — Consult Note (Signed)
Reason for Consult: AF, chest pain  Requesting Physician: Dr Coralyn Pear  HPI:   This is a 79 y.o. female with a past medical history significant for HTN. She had seen Dr Dannielle Burn in 2002. An MRA then revealed no significant RAS. She was then followed by Dr Warren Lacy, and then a cardiologist at [facility]. Her cardiologist at [facility] apparently went out on medical leave and the patient now sees [name]. She has no history of MI or CAD. We don't have any records of an echo or Myoview available. She has PVD and had dopplers in 2009 showing moderate, (123456) LICA stenosis and XX123456 RICA stenosis. She has no history of CVA or TIA. She does have CRI, her last SCr was 1.14 February 2013. She tells me today that she thinks she had atrial fibrillation years ago. She has never been on oral anticoagulation.     She presents to the ER at [facility] today with multiple complaints. She says she had an exacerbation of her chronic low back pain last week. This progressed to pain between her shoulder blades and then SSCP- "pressure". This has been worse with movement, stable at rest. She also complains of nausea, diarrhea, and vague SOB. She noted her HR was faster that usual today (she has baseline NSR/SB), and decided to come to the ER for further evaluation. In the ER she is noted to be in AF with a VR 88-120 with a new LBBB. Initial Troponin is negative. She is admitted now for further evaluation and we are asked to see in consult. Her daughter in law was present during my exam and interview.    PMHx:  Past Medical History  Diagnosis Date  . Hyperlipidemia   . Hypothyroidism   . Arthritis   . Anemia   . Asthma   . Anxiety   . IBS (irritable bowel syndrome)   . Gout   . Colon polyps   . Tubulovillous adenoma 4/07  . Hemorrhoids   . Diverticulosis   . Hypertension     Labile, difficult-to-control, at least stage 2  . Renal insufficiency, mild     Creatinine 1.5  . Chronic diarrhea    Past Surgical History  Procedure  Laterality Date  . Lumbar disc surgery  2000  . Squamous cell skin cancer removed    . Oophorectomy  1962    right  . Appendectomy    . Low anterior bowel resection  7/08  . Cataract extraction      FAMHx: Cancer- Mother age 50, Father died of an MIn   SOCHx:  reports that she has never smoked. She has never used smokeless tobacco. She reports that she drinks alcohol. She reports that she does not use illicit drugs.  ALLERGIES: Allergies  Allergen Reactions  . Amoxicillin     REACTION: unspecified  . Codeine Phosphate     REACTION: unspecified  . Penicillins     REACTION: nausea, swelling  . Xifaxan [Rifaximin] Other (See Comments)    unknown    ROS: Cardiovascular: negative, palpitations GI- no history of GI bleeding See H&P for complete details of ROS  HOME MEDICATIONS: Prescriptions prior to admission  Medication Sig Dispense Refill  . amLODipine (NORVASC) 2.5 MG tablet Take 2.5 mg by mouth daily.      Marland Kitchen aspirin 81 MG tablet Take 81 mg by mouth daily.        . chlorthalidone (HYGROTON) 50 MG tablet Take 1 tablet by mouth every morning.       Marland Kitchen  colchicine 0.6 MG tablet Take 1 tablet (0.6 mg total) by mouth daily.  20 tablet  2  . hyoscyamine (LEVSIN, ANASPAZ) 0.125 MG tablet Take 1-2 tablets by mouth or under tongue every 4 hours as needed before meals  360 tablet  5  . levothyroxine (SYNTHROID, LEVOTHROID) 88 MCG tablet TAKE 1 TABLET BY MOUTH EVERY DAY BEFORE BREAKFAST  30 tablet  11  . omeprazole (PRILOSEC) 40 MG capsule Take 1 capsule (40 mg total) by mouth daily.  30 capsule  11  . telmisartan (MICARDIS) 80 MG tablet Take 1 tablet (80 mg total) by mouth daily.  30 tablet  6  . temazepam (RESTORIL) 30 MG capsule Take 1 capsule by mouth daily.      Marland Kitchen amLODipine (NORVASC) 5 MG tablet Take 2.5 mg by mouth daily.         HOSPITAL MEDICATIONS: I have reviewed the patient's current medications.  VITALS: Blood pressure 155/69, pulse 96, temperature 98.2 F (36.8  C), temperature source Oral, resp. rate 20, SpO2 98.00%.  PHYSICAL EXAM: General appearance: alert, cooperative and no distress Neck: no carotid bruit and no JVD Lungs: clear to auscultation bilaterally Heart: irregularly irregular rhythm and soft sytolic murmur AOv area Abdomen: Non tender midline surgical scar Extremities: no edema Pulses: 2+ and symmetric Skin: Skin color, texture, turgor normal. No rashes or lesions Neurologic: Grossly normal  LABS: Results for orders placed during the hospital encounter of 11/01/13 (from the past 48 hour(s))  CBC WITH DIFFERENTIAL     Status: Abnormal   Collection Time    11/01/13 12:34 PM      Result Value Range   WBC 4.5  4.0 - 10.5 K/uL   RBC 3.23 (*) 3.87 - 5.11 MIL/uL   Hemoglobin 9.1 (*) 12.0 - 15.0 g/dL   HCT 28.4 (*) 36.0 - 46.0 %   MCV 87.9  78.0 - 100.0 fL   MCH 28.2  26.0 - 34.0 pg   MCHC 32.0  30.0 - 36.0 g/dL   RDW 15.0  11.5 - 15.5 %   Platelets 227  150 - 400 K/uL   Neutrophils Relative % 73  43 - 77 %   Neutro Abs 3.3  1.7 - 7.7 K/uL   Lymphocytes Relative 16  12 - 46 %   Lymphs Abs 0.7  0.7 - 4.0 K/uL   Monocytes Relative 11  3 - 12 %   Monocytes Absolute 0.5  0.1 - 1.0 K/uL   Eosinophils Relative 0  0 - 5 %   Eosinophils Absolute 0.0  0.0 - 0.7 K/uL   Basophils Relative 0  0 - 1 %   Basophils Absolute 0.0  0.0 - 0.1 K/uL  COMPREHENSIVE METABOLIC PANEL     Status: Abnormal   Collection Time    11/01/13 12:34 PM      Result Value Range   Sodium 139  135 - 145 mEq/L   Potassium 5.0  3.5 - 5.1 mEq/L   Chloride 106  96 - 112 mEq/L   CO2 20  19 - 32 mEq/L   Glucose, Bld 102 (*) 70 - 99 mg/dL   BUN 41 (*) 6 - 23 mg/dL   Creatinine, Ser 1.62 (*) 0.50 - 1.10 mg/dL   Calcium 8.6  8.4 - 10.5 mg/dL   Total Protein 6.7  6.0 - 8.3 g/dL   Albumin 3.5  3.5 - 5.2 g/dL   AST 23  0 - 37 U/L   Comment: SLIGHT HEMOLYSIS  HEMOLYSIS AT THIS LEVEL MAY AFFECT RESULT   ALT 11  0 - 35 U/L   Alkaline Phosphatase 78  39 - 117 U/L    Total Bilirubin 0.2 (*) 0.3 - 1.2 mg/dL   GFR calc non Af Amer 27 (*) >90 mL/min   GFR calc Af Amer 32 (*) >90 mL/min   Comment: (NOTE)     The eGFR has been calculated using the CKD EPI equation.     This calculation has not been validated in all clinical situations.     eGFR's persistently <90 mL/min signify possible Chronic Kidney     Disease.  LIPASE, BLOOD     Status: None   Collection Time    11/01/13 12:34 PM      Result Value Range   Lipase 51  11 - 59 U/L  TROPONIN I     Status: None   Collection Time    11/01/13 12:34 PM      Result Value Range   Troponin I <0.30  <0.30 ng/mL   Comment:            Due to the release kinetics of cTnI,     a negative result within the first hours     of the onset of symptoms does not rule out     myocardial infarction with certainty.     If myocardial infarction is still suspected,     repeat the test at appropriate intervals.  URINALYSIS W MICROSCOPIC + REFLEX CULTURE     Status: None   Collection Time    11/01/13  1:14 PM      Result Value Range   Color, Urine YELLOW  YELLOW   APPearance CLEAR  CLEAR   Specific Gravity, Urine 1.016  1.005 - 1.030   pH 6.0  5.0 - 8.0   Glucose, UA NEGATIVE  NEGATIVE mg/dL   Hgb urine dipstick NEGATIVE  NEGATIVE   Bilirubin Urine NEGATIVE  NEGATIVE   Ketones, ur NEGATIVE  NEGATIVE mg/dL   Protein, ur NEGATIVE  NEGATIVE mg/dL   Urobilinogen, UA 0.2  0.0 - 1.0 mg/dL   Nitrite NEGATIVE  NEGATIVE   Leukocytes, UA NEGATIVE  NEGATIVE   Urine-Other       Value: NO FORMED ELEMENTS SEEN ON URINE MICROSCOPIC EXAMINATION    EKG: AF with LBBB  IMAGING: Dg Abd Acute W/chest  11/01/2013   CLINICAL DATA:  Chest pain.  Nausea and vomiting.  EXAM: ACUTE ABDOMEN SERIES (ABDOMEN 2 VIEW & CHEST 1 VIEW)  COMPARISON:  Abdomen and pelvis CT, 02/26/2013. Chest radiograph, 05/14/2007.  FINDINGS: Normal bowel gas pattern.  No free air.  A bowel anastomosis staple line lies in the central pelvis. The soft tissues  are otherwise unremarkable.  There is a small hiatal hernia. The cardiac silhouette is mildly enlarged. The lungs are clear.  The bony structures are demineralized with significant lumbar spine degenerative disease.  IMPRESSION: 1. No acute finding. No obstruction or free air. No active disease of the chest radiograph.   Electronically Signed   By: Lajean Manes M.D.   On: 11/01/2013 13:09    IMPRESSION: Active Problems:   Chest pain with moderate risk of acute coronary syndrome   New onset a-fib   ANEMIA-NOS   ANXIETY   Chronic renal insufficiency, stage III (moderate)   LBBB (left bundle branch block)- new in AF   PAF Hx- stable, in NSR for years   HYPOTHYROIDISM   HYPERLIPIDEMIA   HYPERTENSION- no significant  RAS by MRA 2002- (<30% LRAS)   Irritable bowel syndrome   PVD- 123456 LICA, [id number] RICA by doppler 2009   Sinus bradycardia- asymptomatic   GERD (gastroesophageal reflux disease)   RECOMMENDATION:  MD to see. She has risk factors for CAD so further work up will need to be done for chest pain and new LBBB, recurrent AF. I have set her up for a Lexiscan Myoview in the am as opposed to diagnostic cath with her history of renal insufficiency. We will reconsider that if her Troponin's turn positive. She needs an echo and full dose anticoagulation but has a Hgb of 9.1. Currently DVT dose Lovenox has been ordered. - CHA2-DS2-VASc score is 5. She has chronic bradycardia so would be gentle with rate control (50 mg Loressor BID has been ordered which is new for her)  Time Spent Directly with Patient: 45 minutes  Erlene Quan [id number] beeper 11/01/2013, 3:38 PM   I have seen and examined the patient along with Kerin Ransom, [place].  I have reviewed the chart, notes and new data.  I agree with PA's note.  Key new complaints: no further chest pain Key examination changes: now in AF with good rate control Key new findings / data: note she had esophagus "stretched" a few months  ago  PLAN: Rate control has been achieved. It is very important that she be on anticoagulation long term due to high cardioembolic risk. She prefers NOAC to warfarin. For now, anemia needs workup. It is normocytic, normochromic and chronic.  If she has Fe deficiency, may need to have GI evaluation first. Rate control is probably sufficient for now. Will need TEE or 3 weeks of uninterrupted anticoagulation before elective cardioversion In future, may need pacemaker therapy if rate control meds cause symptomatic sinus bradycardia  Sanda Klein, MD, [facility] [phone] 11/01/2013, 6:47 PM

## 2013-11-01 NOTE — Telephone Encounter (Signed)
Sounds like she needs ER.    Doubt driving force is cardiac   Cagney Steenson W, MD

## 2013-11-01 NOTE — ED Notes (Signed)
MD at bedside. 

## 2013-11-01 NOTE — H&P (Signed)
[facility] History and Physical  [name] [id number] DOB: 18-Jun-1924 DOA: 11/01/2013  Referring physician:  PCP: [name], MD   Chief Complaint: Chest Pain  HPI: [name] is a 79 y.o. female with a past medical history of stage III chronic kidney disease, hypertension, gastroesophageal reflux disease who presents to the emergency department with complaints of chest pain. She reports developing chest pain last Friday characterized as tight and pressure-like located in the retrosternal region, nonradiating, intermittent lasting 20 minutes, worsened by physical exertion, associated with intermittent episodes of nausea. She denies shortness of breath, cough, palpitations, dizziness, lightheadedness, diaphoresis or emesis. She denies having a history of chest pain. She also reports having lower back pain last week as well as diarrhea, worse from baseline. In the emergency room she had a negative troponin with an EKG showing atrial fibrillation, no ischemic changes are seen. Chest x-ray did not reveal acute infiltrate. During my counter she is chest pain-free, appears comfortable in no acute distress.                                                                         Review of Systems:  Constitutional:  No weight loss, night sweats, Fevers, chills, fatigue.  HEENT:  No headaches, Difficulty swallowing,Tooth/dental problems,Sore throat,  No sneezing, itching, ear ache, nasal congestion, post nasal drip,  Cardio-vascular:  Orthopnea, PND, swelling in lower extremities, anasarca, dizziness, palpitations  GI:  No heartburn, indigestion, abdominal pain, nausea, vomiting, diarrhea, change in bowel habits, loss of appetite  Resp:  No shortness of breath with exertion or at rest. No excess mucus, no productive cough, No non-productive cough, No coughing up of blood.No change in color of mucus.No wheezing.No chest wall deformity  Skin:  no rash or lesions.  GU:  no  dysuria, change in color of urine, no urgency or frequency. No flank pain.  Musculoskeletal:  No joint pain or swelling. No decreased range of motion. No back pain.  Psych:  No change in mood or affect. No depression or anxiety. No memory loss.   Past Medical History  Diagnosis Date  . Hyperlipidemia   . Hypothyroidism   . Arthritis   . Anemia   . Asthma   . Anxiety   . IBS (irritable bowel syndrome)   . Gout   . Colon polyps   . Tubulovillous adenoma 4/07  . Hemorrhoids   . Diverticulosis   . Hypertension     Labile, difficult-to-control, at least stage 2  . Renal insufficiency, mild     Creatinine 1.5  . Chronic diarrhea    Past Surgical History  Procedure Laterality Date  . Lumbar disc surgery  2000  . Squamous cell skin cancer removed    . Oophorectomy  1962    right  . Appendectomy    . Low anterior bowel resection  7/08  . Cataract extraction     Social History:  reports that she has never smoked. She has never used smokeless tobacco. She reports that she drinks alcohol. She reports that she does not use illicit drugs.  Allergies  Allergen Reactions  . Amoxicillin     REACTION: unspecified  . Codeine Phosphate     REACTION: unspecified  .  Penicillins     REACTION: nausea, swelling  . Xifaxan [Rifaximin] Other (See Comments)    unknown    Family History  Problem Relation Age of Onset  . Cancer Mother 55  . Heart attack Father   . Heart disease Brother   . Stroke Maternal Grandmother   . Cancer Maternal Grandfather     Lung cancer  . Stroke Paternal Grandmother   . Cancer Daughter      Prior to Admission medications   Medication Sig Start Date End Date Taking? Authorizing Provider  amLODipine (NORVASC) 2.5 MG tablet Take 2.5 mg by mouth daily.   Yes Historical Provider, MD  aspirin 81 MG tablet Take 81 mg by mouth daily.     Yes Historical Provider, MD  chlorthalidone (HYGROTON) 50 MG tablet Take 1 tablet by mouth every morning.  05/07/11  Yes  Historical Provider, MD  colchicine 0.6 MG tablet Take 1 tablet (0.6 mg total) by mouth daily. 09/23/13  Yes Wallene Huh, DPM  hyoscyamine (LEVSIN, ANASPAZ) 0.125 MG tablet Take 1-2 tablets by mouth or under tongue every 4 hours as needed before meals 10/29/13  Yes Ladene Artist, MD  levothyroxine (SYNTHROID, LEVOTHROID) 88 MCG tablet TAKE 1 TABLET BY MOUTH EVERY DAY BEFORE BREAKFAST 10/06/13  Yes [name], MD  omeprazole (PRILOSEC) 40 MG capsule Take 1 capsule (40 mg total) by mouth daily. 08/12/13  Yes Ladene Artist, MD  telmisartan (MICARDIS) 80 MG tablet Take 1 tablet (80 mg total) by mouth daily. 04/12/13  Yes [name], MD  temazepam (RESTORIL) 30 MG capsule Take 1 capsule by mouth daily. 06/30/13  Yes Historical Provider, MD  amLODipine (NORVASC) 5 MG tablet Take 2.5 mg by mouth daily.  04/29/11   Historical Provider, MD   Physical Exam: Filed Vitals:   11/01/13 1309  BP: 157/60  Pulse: 109  Temp:   Resp:     BP 157/60  Pulse 109  Temp(Src) 98.2 F (36.8 C) (Oral)  Resp 17  SpO2 96%  General:  Appears calm and comfortable Eyes: PERRL, normal lids, irises & conjunctiva ENT: grossly normal hearing, lips & tongue Neck: no LAD, masses or thyromegaly Cardiovascular: RRR, no m/r/g. No LE edema. Telemetry: SR, no arrhythmias  Respiratory: CTA bilaterally, no w/r/r. Normal respiratory effort. Abdomen: soft, ntnd Skin: no rash or induration seen on limited exam Musculoskeletal: grossly normal tone BUE/BLE Psychiatric: grossly normal mood and affect, speech fluent and appropriate Neurologic: grossly non-focal.          Labs on Admission:  Basic Metabolic Panel:  Recent Labs Lab 11/01/13 1234  NA 139  K 5.0  CL 106  CO2 20  GLUCOSE 102*  BUN 41*  CREATININE 1.62*  CALCIUM 8.6   Liver Function Tests:  Recent Labs Lab 11/01/13 1234  AST 23  ALT 11  ALKPHOS 78  BILITOT 0.2*  PROT 6.7  ALBUMIN 3.5    Recent Labs Lab 11/01/13 1234  LIPASE 51    No results found for this basename: AMMONIA,  in the last 168 hours CBC:  Recent Labs Lab 11/01/13 1234  WBC 4.5  NEUTROABS 3.3  HGB 9.1*  HCT 28.4*  MCV 87.9  PLT 227   Cardiac Enzymes:  Recent Labs Lab 11/01/13 1234  TROPONINI <0.30    BNP (last 3 results) No results found for this basename: PROBNP,  in the last 8760 hours CBG: No results found for this basename: GLUCAP,  in the last 168 hours  Radiological Exams on Admission: Dg Abd Acute W/chest  11/01/2013   CLINICAL DATA:  Chest pain.  Nausea and vomiting.  EXAM: ACUTE ABDOMEN SERIES (ABDOMEN 2 VIEW & CHEST 1 VIEW)  COMPARISON:  Abdomen and pelvis CT, 02/26/2013. Chest radiograph, 05/14/2007.  FINDINGS: Normal bowel gas pattern.  No free air.  A bowel anastomosis staple line lies in the central pelvis. The soft tissues are otherwise unremarkable.  There is a small hiatal hernia. The cardiac silhouette is mildly enlarged. The lungs are clear.  The bony structures are demineralized with significant lumbar spine degenerative disease.  IMPRESSION: 1. No acute finding. No obstruction or free air. No active disease of the chest radiograph.   Electronically Signed   By: Lajean Manes M.D.   On: 11/01/2013 13:09    EKG: Independently reviewed. Atrial fibrillation, no ischemic changes noted   Assessment/Plan Active Problems:   Chest pain   New onset a-fib   HYPOTHYROIDISM   HYPERLIPIDEMIA   ANXIETY   HYPERTENSION- no significant RAS by MRA 2002- (<30% LRAS)   Irritable bowel syndrome   Chronic renal insufficiency, stage III (moderate)   GERD (gastroesophageal reflux disease)   1. Chest pain. Patient presenting with chest pain having atypical features, with initial set of cardiac enzymes negative, EKG showing no changes. She does however have cardiovascular risk factors including hypertension. Chest x-ray showed no acute cardiac pulmonary abnormality. Will start antiplatelet therapy with aspirin 325 mg by mouth daily,  start statin and beta blocker therapy with metoprolol 50 mg by mouth twice a day. Continue ARB therapy. Will cycle troponin, obtain a cardiology consultation. 2. Atrial fibrillation. Appears to be new onset as medical records do not mention history of atrial fibrillation. Patient unaware of diagnosis as well. Will start metoprolol 50 mg by mouth twice a day, check a TSH as well as a transthoracic echocardiogram. 3. Hypertension. Patient presented with elevated blood pressures, we'll continue ARB therapy with irbesartan, start metoprolol 50 mg twice a day.  4. Diarrhea. Patient reporting a history of chronic diarrhea in setting of irritable bowel syndrome. Although she states he had increased diarrhea from baseline we'll check a stool for. 5. Stage III chronic kidney disease. Patient appear to have a baseline creatinine of  1.6. Kidney function stable. 6. Hypothyroidism we will check a TSH, continue home regimen of Synthroid.    Code Status: Full Code Family Communication: Family members at bedside updated Disposition Plan: Place patient in 24 hour obs, do not participate her requiring greater than 2 nights hospitalization.   Time spent: 60 minutes  Kelvin Cellar [facility] Pager [phone]

## 2013-11-01 NOTE — ED Notes (Signed)
Pt states that back pain started last Tuesday that moved to her central chest heaviness on Thursday. Pt states that it is only there when she moves around 8/10 and feels she is having to shallow breathe due to the chest heaviness.  Pt denies shob, dizziness, n/v, weakness.  Pt called her MD and they advised her to come to ED for further eval.  Pt does have PMH of afib.

## 2013-11-01 NOTE — ED Provider Notes (Signed)
CSN: [id number]     Arrival date & time 11/01/13  1201 History   First MD Initiated Contact with Patient 11/01/13 1214     Chief Complaint  Patient presents with  . Chest Pain    HPI Pt was seen at 1220. Per pt, c/o gradual onset and worsening of multiple intermittent episodes of chest "pain" and SOB for the past 3 days. Describes the CP as "heaviness" and "pressure." Worsens with exertion, improves with rest. Also states she has noticed her "HR is higher than my usual" for the past several days. States her "usual HR is in the 50's" and "has been going up to "110's." Denies palpitations, no cough, no abd pain, no N/V/D, no fevers.    Past Medical History  Diagnosis Date  . Hyperlipidemia   . Hypothyroidism   . Arthritis   . Anemia   . Asthma   . Anxiety   . IBS (irritable bowel syndrome)   . Gout   . Colon polyps   . Tubulovillous adenoma 4/07  . Hemorrhoids   . Diverticulosis   . Hypertension     Labile, difficult-to-control, at least stage 2  . Renal insufficiency, mild     Creatinine 1.5  . Chronic diarrhea    Past Surgical History  Procedure Laterality Date  . Lumbar disc surgery  2000  . Squamous cell skin cancer removed    . Oophorectomy  1962    right  . Appendectomy    . Low anterior bowel resection  7/08  . Cataract extraction     Family History  Problem Relation Age of Onset  . Cancer Mother 23  . Heart attack Father   . Heart disease Brother   . Stroke Maternal Grandmother   . Cancer Maternal Grandfather     Lung cancer  . Stroke Paternal Grandmother   . Cancer Daughter    History  Substance Use Topics  . Smoking status: Never Smoker   . Smokeless tobacco: Never Used  . Alcohol Use: Yes     Comment: social wine intake    Review of Systems ROS: Statement: All systems negative except as marked or noted in the HPI; Constitutional: Negative for fever and chills. ; ; Eyes: Negative for eye pain, redness and discharge. ; ; ENMT: Negative for ear  pain, hoarseness, nasal congestion, sinus pressure and sore throat. ; ; Cardiovascular: +CP, SOB. Negative for palpitations, diaphoresis, and peripheral edema. ; ; Respiratory: Negative for cough, wheezing and stridor. ; ; Gastrointestinal: +chronic diarrhea. Negative for nausea, vomiting, abdominal pain, blood in stool, hematemesis, jaundice and rectal bleeding. . ; ; Genitourinary: Negative for dysuria, flank pain and hematuria. ; ; Musculoskeletal: Negative for back pain and neck pain. Negative for swelling and trauma.; ; Skin: Negative for pruritus, rash, abrasions, blisters, bruising and skin lesion.; ; Neuro: Negative for headache, lightheadedness and neck stiffness. Negative for weakness, altered level of consciousness , altered mental status, extremity weakness, paresthesias, involuntary movement, seizure and syncope.       Allergies  Amoxicillin; Codeine phosphate; Penicillins; and Xifaxan  Home Medications   Current Outpatient Rx  Name  Route  Sig  Dispense  Refill  . amLODipine (NORVASC) 2.5 MG tablet   Oral   Take 2.5 mg by mouth daily.         Marland Kitchen aspirin 81 MG tablet   Oral   Take 81 mg by mouth daily.           . chlorthalidone (HYGROTON)  50 MG tablet   Oral   Take 1 tablet by mouth every morning.          . colchicine 0.6 MG tablet   Oral   Take 1 tablet (0.6 mg total) by mouth daily.   20 tablet   2   . hyoscyamine (LEVSIN, ANASPAZ) 0.125 MG tablet      Take 1-2 tablets by mouth or under tongue every 4 hours as needed before meals   360 tablet   5   . levothyroxine (SYNTHROID, LEVOTHROID) 88 MCG tablet      TAKE 1 TABLET BY MOUTH EVERY DAY BEFORE BREAKFAST   30 tablet   11   . omeprazole (PRILOSEC) 40 MG capsule   Oral   Take 1 capsule (40 mg total) by mouth daily.   30 capsule   11   . telmisartan (MICARDIS) 80 MG tablet   Oral   Take 1 tablet (80 mg total) by mouth daily.   30 tablet   6   . temazepam (RESTORIL) 30 MG capsule   Oral    Take 1 capsule by mouth daily.         Marland Kitchen amLODipine (NORVASC) 5 MG tablet   Oral   Take 2.5 mg by mouth daily.           BP 157/60  Pulse 109  Temp(Src) 98.2 F (36.8 C) (Oral)  Resp 17  SpO2 96% Physical Exam 1225: Physical examination:  Nursing notes reviewed; Vital signs and O2 SAT reviewed;  Constitutional: Well developed, Well nourished, Well hydrated, In no acute distress; Head:  Normocephalic, atraumatic; Eyes: EOMI, PERRL, No scleral icterus; ENMT: Mouth and pharynx normal, Mucous membranes moist; Neck: Supple, Full range of motion, No lymphadenopathy; Cardiovascular: Irregular irregular rate and rhythm, No gallop; Respiratory: Breath sounds clear & equal bilaterally, No wheezes.  Speaking full sentences with ease, Normal respiratory effort/excursion; Chest: Nontender, Movement normal; Abdomen: Soft, Nontender, Nondistended, Normal bowel sounds; Genitourinary: No CVA tenderness; Extremities: Pulses normal, No tenderness, No edema, No calf edema or asymmetry.; Neuro: AA&Ox3, Major CN grossly intact.  Speech clear. No gross focal motor or sensory deficits in extremities.; Skin: Color normal, Warm, Dry.   ED Course  Procedures   EKG Interpretation    Date/Time:  Monday November 01 2013 12:10:17 EST Ventricular Rate:  106 PR Interval:    QRS Duration: 127 QT Interval:  350 QTC Calculation: 465 R Axis:   -31 Text Interpretation:  Atrial fibrillation Left axis deviation Left bundle branch block Baseline wander in lead(s) V6 When compared with ECG of 02/25/2013, Atrial fibrillation is now Present Confirmed by [facility]  MD, Nunzio Cory [phone]) on 11/01/2013 1:51:23 PM            MDM  MDM Reviewed: previous chart, nursing note and vitals Reviewed previous: labs and ECG Interpretation: labs, ECG and x-ray     Results for orders placed during the hospital encounter of 11/01/13  URINALYSIS W MICROSCOPIC + REFLEX CULTURE      Result Value Range   Color, Urine YELLOW   YELLOW   APPearance CLEAR  CLEAR   Specific Gravity, Urine 1.016  1.005 - 1.030   pH 6.0  5.0 - 8.0   Glucose, UA NEGATIVE  NEGATIVE mg/dL   Hgb urine dipstick NEGATIVE  NEGATIVE   Bilirubin Urine NEGATIVE  NEGATIVE   Ketones, ur NEGATIVE  NEGATIVE mg/dL   Protein, ur NEGATIVE  NEGATIVE mg/dL   Urobilinogen, UA 0.2  0.0 - 1.0  mg/dL   Nitrite NEGATIVE  NEGATIVE   Leukocytes, UA NEGATIVE  NEGATIVE   Urine-Other       Value: NO FORMED ELEMENTS SEEN ON URINE MICROSCOPIC EXAMINATION  CBC WITH DIFFERENTIAL      Result Value Range   WBC 4.5  4.0 - 10.5 K/uL   RBC 3.23 (*) 3.87 - 5.11 MIL/uL   Hemoglobin 9.1 (*) 12.0 - 15.0 g/dL   HCT 28.4 (*) 36.0 - 46.0 %   MCV 87.9  78.0 - 100.0 fL   MCH 28.2  26.0 - 34.0 pg   MCHC 32.0  30.0 - 36.0 g/dL   RDW 15.0  11.5 - 15.5 %   Platelets 227  150 - 400 K/uL   Neutrophils Relative % 73  43 - 77 %   Neutro Abs 3.3  1.7 - 7.7 K/uL   Lymphocytes Relative 16  12 - 46 %   Lymphs Abs 0.7  0.7 - 4.0 K/uL   Monocytes Relative 11  3 - 12 %   Monocytes Absolute 0.5  0.1 - 1.0 K/uL   Eosinophils Relative 0  0 - 5 %   Eosinophils Absolute 0.0  0.0 - 0.7 K/uL   Basophils Relative 0  0 - 1 %   Basophils Absolute 0.0  0.0 - 0.1 K/uL  COMPREHENSIVE METABOLIC PANEL      Result Value Range   Sodium 139  135 - 145 mEq/L   Potassium 5.0  3.5 - 5.1 mEq/L   Chloride 106  96 - 112 mEq/L   CO2 20  19 - 32 mEq/L   Glucose, Bld 102 (*) 70 - 99 mg/dL   BUN 41 (*) 6 - 23 mg/dL   Creatinine, Ser 1.62 (*) 0.50 - 1.10 mg/dL   Calcium 8.6  8.4 - 10.5 mg/dL   Total Protein 6.7  6.0 - 8.3 g/dL   Albumin 3.5  3.5 - 5.2 g/dL   AST 23  0 - 37 U/L   ALT 11  0 - 35 U/L   Alkaline Phosphatase 78  39 - 117 U/L   Total Bilirubin 0.2 (*) 0.3 - 1.2 mg/dL   GFR calc non Af Amer 27 (*) >90 mL/min   GFR calc Af Amer 32 (*) >90 mL/min  LIPASE, BLOOD      Result Value Range   Lipase 51  11 - 59 U/L  TROPONIN I      Result Value Range   Troponin I <0.30  <0.30 ng/mL   Dg Abd  Acute W/chest 11/01/2013   CLINICAL DATA:  Chest pain.  Nausea and vomiting.  EXAM: ACUTE ABDOMEN SERIES (ABDOMEN 2 VIEW & CHEST 1 VIEW)  COMPARISON:  Abdomen and pelvis CT, 02/26/2013. Chest radiograph, 05/14/2007.  FINDINGS: Normal bowel gas pattern.  No free air.  A bowel anastomosis staple line lies in the central pelvis. The soft tissues are otherwise unremarkable.  There is a small hiatal hernia. The cardiac silhouette is mildly enlarged. The lungs are clear.  The bony structures are demineralized with significant lumbar spine degenerative disease.  IMPRESSION: 1. No acute finding. No obstruction or free air. No active disease of the chest radiograph.   Electronically Signed   By: Lajean Manes M.D.   On: 11/01/2013 13:09    Results for ALIZAYAH, NEDLEY (MRN [id number]) as of 11/01/2013 14:33  Ref. Range 11/02/2011 13:00 02/25/2013 20:21 09/08/2013 11:47 11/01/2013 12:34  BUN Latest Range: 6-23 mg/dL 32 (H) 46 (H) 41 (H) 41 (H)  Creatinine Latest Range: 0.50-1.10 mg/dL 1.47 (H) 1.65 (H) 1.6 (H) 1.62 (H)   Results for ROSIBEL, BUETER (MRN [id number]) as of 11/01/2013 14:33  Ref. Range 07/11/2011 11:34 11/02/2011 13:00 02/25/2013 20:21 09/08/2013 11:47 11/01/2013 12:34  Hemoglobin Latest Range: 12.0-15.0 g/dL 10.5 (L) 11.5 (L) 10.5 (L) 9.2 (L) 9.1 (L)  HCT Latest Range: 36.0-46.0 % 31.7 (L) 34.8 (L) 31.2 (L) 27.4 (L) 28.4 (L)     1350:  No CP while in the ED. No stooling while in the ED. EPIC chart reviewed: Afib and LBBB today appears new, rate has been 80-110's while in the ED. Dx and testing d/w pt.  Questions answered.  Verb understanding, agreeable to admit. T/C to [facility]. Coralyn Pear, case discussed, including:  HPI, pertinent PM/SHx, VS/PE, dx testing, ED course and treatment:  Agreeable to admit, requests to write temporary orders, obtain tele bed to team 8.   Alfonzo Feller, DO 11/02/13 1119

## 2013-11-02 ENCOUNTER — Other Ambulatory Visit: Payer: Self-pay

## 2013-11-02 ENCOUNTER — Observation Stay (HOSPITAL_COMMUNITY)
Admit: 2013-11-02 | Discharge: 2013-11-02 | Disposition: A | Discharge status: Home / Self Care | Payer: Medicare Other | Attending: Cardiology | Admitting: Cardiology

## 2013-11-02 ENCOUNTER — Encounter (HOSPITAL_COMMUNITY)
Admit: 2013-11-02 | Discharge: 2013-11-02 | Disposition: A | Discharge status: Home / Self Care | Payer: Medicare Other | Attending: Cardiology | Admitting: Cardiology

## 2013-11-02 ENCOUNTER — Ambulatory Visit (HOSPITAL_COMMUNITY)
Admit: 2013-11-02 | Discharge: 2013-11-02 | Disposition: A | Discharge status: Home / Self Care | Payer: Medicare Other | Attending: Cardiology | Admitting: Cardiology

## 2013-11-02 DIAGNOSIS — I498 Other specified cardiac arrhythmias: Secondary | ICD-10-CM

## 2013-11-02 DIAGNOSIS — R079 Chest pain, unspecified: Secondary | ICD-10-CM | POA: Insufficient documentation

## 2013-11-02 DIAGNOSIS — I4891 Unspecified atrial fibrillation: Secondary | ICD-10-CM

## 2013-11-02 DIAGNOSIS — R609 Edema, unspecified: Secondary | ICD-10-CM

## 2013-11-02 LAB — BASIC METABOLIC PANEL
BUN: 39 mg/dL — ABNORMAL HIGH (ref 6–23)
Calcium: 8.1 mg/dL — ABNORMAL LOW (ref 8.4–10.5)
Chloride: 105 mEq/L (ref 96–112)
Creatinine, Ser: 1.94 mg/dL — ABNORMAL HIGH (ref 0.50–1.10)
GFR calc Af Amer: 26 mL/min — ABNORMAL LOW (ref >90)
Potassium: 4.7 mEq/L (ref 3.5–5.1)

## 2013-11-02 LAB — IRON AND TIBC
Iron: 27 ug/dL — ABNORMAL LOW (ref 42–135)
Saturation Ratios: 7 % — ABNORMAL LOW (ref 20–55)

## 2013-11-02 LAB — LIPID PANEL
HDL: 47 mg/dL (ref >39)
Triglycerides: 111 mg/dL (ref <150)
VLDL: 22 mg/dL (ref 0–40)

## 2013-11-02 LAB — CBC
MCHC: 32 g/dL (ref 30.0–36.0)
Platelets: 178 10*3/uL (ref 150–400)
RBC: 2.95 MIL/uL — ABNORMAL LOW (ref 3.87–5.11)
RDW: 15.2 % (ref 11.5–15.5)
WBC: 3.4 10*3/uL — ABNORMAL LOW (ref 4.0–10.5)

## 2013-11-02 LAB — FERRITIN: Ferritin: 12 ng/mL (ref 10–291)

## 2013-11-02 LAB — TROPONIN I
Troponin I: <0.3 ng/mL (ref <0.30)
Troponin I: <0.3 ng/mL (ref <0.30)

## 2013-11-02 LAB — VITAMIN B12: Vitamin B-12: 240 pg/mL (ref 211–911)

## 2013-11-02 MED ORDER — TRAMADOL HCL 50 MG PO TABS: 50.0000 mg | ORAL_TABLET | Freq: Four times a day (QID) | ORAL | Status: DC | PRN

## 2013-11-02 MED ORDER — TECHNETIUM TC 99M SESTAMIBI GENERIC - CARDIOLITE
30.0000 | Freq: Once | INTRAVENOUS | Status: AC | PRN
  Administered 2013-11-02: 30 via INTRAVENOUS

## 2013-11-02 MED ORDER — FERUMOXYTOL INJECTION 510 MG/17 ML
510.0000 mg | Freq: Once | INTRAVENOUS | Status: AC
  Administered 2013-11-02: 510 mg via INTRAVENOUS
  Filled 2013-11-02: qty 17

## 2013-11-02 MED ORDER — VITAMIN B-12 1000 MCG PO TABS
1000.0000 ug | ORAL_TABLET | Freq: Every day | ORAL | Status: DC
  Administered 2013-11-03: 1000 ug via ORAL
  Filled 2013-11-02: qty 1

## 2013-11-02 MED ORDER — TECHNETIUM TC 99M SESTAMIBI GENERIC - CARDIOLITE
10.0000 | Freq: Once | INTRAVENOUS | Status: AC | PRN
  Administered 2013-11-02: 10 via INTRAVENOUS

## 2013-11-02 MED ORDER — METOPROLOL TARTRATE 50 MG PO TABS
75.0000 mg | ORAL_TABLET | Freq: Two times a day (BID) | ORAL | Status: DC
  Administered 2013-11-02 – 2013-11-03 (×3): 75 mg via ORAL
  Filled 2013-11-02 (×4): qty 1

## 2013-11-02 MED ORDER — ZOLPIDEM TARTRATE 5 MG PO TABS
5.0000 mg | ORAL_TABLET | Freq: Every evening | ORAL | Status: DC | PRN
  Administered 2013-11-02: 5 mg via ORAL
  Filled 2013-11-02: qty 1

## 2013-11-02 MED ORDER — REGADENOSON 0.4 MG/5ML IV SOLN
INTRAVENOUS | Status: AC
  Administered 2013-11-02: 0.4 mg via INTRAVENOUS
  Filled 2013-11-02: qty 5

## 2013-11-02 MED ORDER — REGADENOSON 0.4 MG/5ML IV SOLN
0.4000 mg | Freq: Once | INTRAVENOUS | Status: AC
  Administered 2013-11-02: 0.4 mg via INTRAVENOUS

## 2013-11-02 NOTE — Progress Notes (Addendum)
[facility] PROGRESS NOTE  [name] [id number] DOB: [birth year] DOA: 11/01/2013 PCP: [name], MD  Assessment/Plan  Atrial fibrillation.  HR in low 100s.  Has history of sinus bradycardia.  May need pacemaker if this recurs.   -  Metoprolol to be increased to 75mg  BID by cardiology today -  Continue telemetry -  TSH wnl -  ECHO pending -  Will need a/c.  Agree with apixaban pending results of anemia work up -  Possible cardioversion in 3-4 weeks if still feeling run down despite rate control  Atypical chest pain.  -  Troponins neg -  CXR with no acute abnl -  ECG with atrial fibrillation -  Stress test with some pain during exam, NM results without reversible ischemia.  Preserved EF -  ECHO pending  IBS Diarrhea, chronic and related to distant bowel surgery.  Last BM > 36 hours ago.  This is normal for her. -  D/c enteric precautions.   Stage III chronic kidney disease. Patient appear to have a baseline creatinine of 1.6. BUN and creatinine mildly elevated -  Continue IVF  Hypothyroidism, TSH wnl, continue home regimen of Synthroid.  Normocytic anemia, chronic, may be due in part to renal parenchymal disease -  Iron studies:  Iron deficiency -  B12 deficiency likely, level 240 -  Folate pending -  SPEP/UPEP/IFE -  Occult stool -  TSH wnl -  Last colonoscopy was 04/2012 by Dr. Fuller Plan and was negative for polyps or other concerning lesions . Also had EGD two months ago with esophagitis and erosive gastritis.  Remain on PPI.   -  Has not tolerated oral iron in the past:  Feraheme x 1 -  Start vitamin B12 1067mcg daily  Diet:  Healthy heart  Access:  PIV IVF:  yes Proph:  lovenox  Code Status: full Family Communication: patient alone Disposition Plan: likely tomorrow after titration of beta blocker and further anemia work up  Consultants:  Cardiology, Dr. Ellyn Hack  Procedures:  ECHO  NM stress test  Antibiotics:  none    HPI/Subjective:  States she feels fatigued.  Denies SOB, current CP, nausea, vomiting.  Last BM was loose and was the night before last.  Normal stool for her.    Objective: Filed Vitals:   11/01/13 1549 11/01/13 2138 11/02/13 0614 11/02/13 1300  BP: 153/81 150/64 143/60 154/78  Pulse: 93 69 106 89  Temp: 97.7 F (36.5 C) 97.8 F (36.6 C) 98.1 F (36.7 C) 98 F (36.7 C)  TempSrc: Oral Oral Oral Oral  Resp: 20 20 14 18   Height: 5' 4.5" (1.638 m)     Weight: 69.9 kg (154 lb 1.6 oz)     SpO2: 100% 100% 99% 100%    Intake/Output Summary (Last 24 hours) at 11/02/13 1356 Last data filed at 11/02/13 0902  Gross per 24 hour  Intake   1355 ml  Output    525 ml  Net    830 ml   Filed Weights   11/01/13 1549  Weight: 69.9 kg (154 lb 1.6 oz)    Exam:   General:  Thin CF, No acute distress  HEENT:  NCAT, MMM  Cardiovascular:  IRRR and mildly tachycardic, nl S1, S2 no mrg, 2+ pulses, warm extremities  Respiratory:  CTAB, no increased WOB  Abdomen:   NABS, soft, NT/ND  MSK:   Normal tone and bulk, no LEE  Neuro:  Grossly intact  Data Reviewed: Basic Metabolic Panel:  Recent  Labs Lab 11/01/13 1234 11/01/13 1632 11/02/13 0440  NA 139  --  136  K 5.0  --  4.7  CL 106  --  105  CO2 20  --  21  GLUCOSE 102*  --  99  BUN 41*  --  39*  CREATININE 1.62* 1.63* 1.94*  CALCIUM 8.6  --  8.1*   Liver Function Tests:  Recent Labs Lab 11/01/13 1234  AST 23  ALT 11  ALKPHOS 78  BILITOT 0.2*  PROT 6.7  ALBUMIN 3.5    Recent Labs Lab 11/01/13 1234  LIPASE 51   No results found for this basename: AMMONIA,  in the last 168 hours CBC:  Recent Labs Lab 11/01/13 1234 11/01/13 1632 11/02/13 0440  WBC 4.5 4.0 3.4*  NEUTROABS 3.3  --   --   HGB 9.1* 8.5* 8.2*  HCT 28.4* 26.7* 25.6*  MCV 87.9 87.3 86.8  PLT 227 181 178   Cardiac Enzymes:  Recent Labs Lab 11/01/13 1234 11/01/13 1631 11/01/13 2305 11/02/13 0440  TROPONINI <0.30 <0.30 <0.30 <0.30    BNP (last 3 results) No results found for this basename: PROBNP,  in the last 8760 hours CBG: No results found for this basename: GLUCAP,  in the last 168 hours  No results found for this or any previous visit (from the past 240 hour(s)).   Studies: Nm Myocar Multi W/spect W/wall Motion / Ef  11/02/2013   CLINICAL DATA:  Chest pain  EXAM: MYOCARDIAL IMAGING WITH SPECT (REST AND PHARMACOLOGIC-STRESS)  GATED LEFT VENTRICULAR WALL MOTION STUDY  LEFT VENTRICULAR EJECTION FRACTION  TECHNIQUE: Standard myocardial SPECT imaging was performed after resting intravenous injection of 10 mCi Tc-58m sestamibi. Subsequently, intravenous infusion of Lexiscan was performed under the supervision of the Cardiology staff. At peak effect of the drug, 30 mCi Tc-35m sestamibi was injected intravenously and standard myocardial SPECT imaging was performed. Quantitative gated imaging was also performed to evaluate left ventricular wall motion, and estimate left ventricular ejection fraction.  COMPARISON:  None.  FINDINGS: SPECT: No definite inducible or reversible ischemia with pharmacologic stress. No fixed defects.  Wall motion: Grossly normal wall motion and thickening  Ejection fraction: Calculated ejection fraction is 60%.  IMPRESSION: Negative for inducible ischemia.  60% ejection fraction   Electronically Signed   By: Daryll Brod M.D.   On: 11/02/2013 13:18   Dg Abd Acute W/chest  11/01/2013   CLINICAL DATA:  Chest pain.  Nausea and vomiting.  EXAM: ACUTE ABDOMEN SERIES (ABDOMEN 2 VIEW & CHEST 1 VIEW)  COMPARISON:  Abdomen and pelvis CT, 02/26/2013. Chest radiograph, 05/14/2007.  FINDINGS: Normal bowel gas pattern.  No free air.  A bowel anastomosis staple line lies in the central pelvis. The soft tissues are otherwise unremarkable.  There is a small hiatal hernia. The cardiac silhouette is mildly enlarged. The lungs are clear.  The bony structures are demineralized with significant lumbar spine degenerative  disease.  IMPRESSION: 1. No acute finding. No obstruction or free air. No active disease of the chest radiograph.   Electronically Signed   By: Lajean Manes M.D.   On: 11/01/2013 13:09    Scheduled Meds: . aspirin EC  325 mg Oral Daily  . atorvastatin  40 mg Oral q1800  . enoxaparin (LOVENOX) injection  30 mg Subcutaneous Q24H  . irbesartan  150 mg Oral Daily  . metoprolol tartrate  50 mg Oral BID  . pantoprazole  40 mg Oral Daily  . sodium chloride  3  mL Intravenous Q12H   Continuous Infusions: . sodium chloride 75 mL/hr at 11/02/13 0507    Principal Problem:   Chest pain with moderate risk of acute coronary syndrome Active Problems:   HYPOTHYROIDISM   HYPERLIPIDEMIA   ANEMIA-NOS   ANXIETY   HYPERTENSION- no significant RAS by MRA 2002- (<30% LRAS)   Irritable bowel syndrome   Chronic renal insufficiency, stage III (moderate)   PVD- 123456 LICA, [id number] RICA by doppler 2009   Sinus bradycardia- asymptomatic   GERD (gastroesophageal reflux disease)   New onset a-fib   LBBB (left bundle branch block)- new in AF   PAF Hx- stable, in NSR for years    Time spent: 30 min    Brittney Mucha, [facility] Pager [phone]. If 7PM-7AM, please contact night-coverage at [email or web address], password [facility] 11/02/2013, 1:56 PM  LOS: 1 day

## 2013-11-02 NOTE — Progress Notes (Addendum)
Subjective: No complaints; does not really note irregular heartbeat  Objective: Vital signs in last 24 hours: Temp:  [97.7 F (36.5 C)-98.2 F (36.8 C)] 98.1 F (36.7 C) (12/23 0614) Pulse Rate:  [69-113] 106 (12/23 0614) Resp:  [14-20] 14 (12/23 0614) BP: (143-166)/(60-121) 143/60 mmHg (12/23 0614) SpO2:  [96 %-100 %] 99 % (12/23 0614) Weight:  [154 lb 1.6 oz (69.9 kg)] 154 lb 1.6 oz (69.9 kg) (12/22 1549) Weight change:  Last BM Date: 10/31/13 Intake/Output from previous day:  12/22 0701 - 12/23 0700 In: 1355 [P.O.:360; I.V.:995] Out: 525 [Urine:525] Intake/Output this shift:    [id number] affect, NAD Skin:Warm and dry, brisk capillary refill HEENT:normocephalic, sclera clear, mucus membranes moist Heart:irreg irreg without murmur, gallup, rub or click Lungs:clear without rales, rhonchi, or wheezes [id number], non tender, + BS, do not palpate liver spleen or masses Ext:no lower ext edema, 2+ pedal pulses, 2+ radial pulses Neuro:alert and oriented, MAE, follows commands, + facial symmetry   Lab Results:  Recent Labs  11/01/13 1632 11/02/13 0440  WBC 4.0 3.4*  HGB 8.5* 8.2*  HCT 26.7* 25.6*  PLT 181 178   BMET  Recent Labs  11/01/13 1234 11/01/13 1632 11/02/13 0440  NA 139  --  136  K 5.0  --  4.7  CL 106  --  105  CO2 20  --  21  GLUCOSE 102*  --  99  BUN 41*  --  39*  CREATININE 1.62* 1.63* 1.94*  CALCIUM 8.6  --  8.1*    Recent Labs  11/01/13 2305 11/02/13 0440  TROPONINI <0.30 <0.30    Lab Results  Component Value Date   CHOL 159 11/02/2013   HDL 47 11/02/2013   LDLCALC 90 11/02/2013   LDLDIRECT 142.3 09/08/2013   TRIG 111 11/02/2013   CHOLHDL 3.4 11/02/2013   No results found for this basename: HGBA1C     Lab Results  Component Value Date   TSH 0.607 11/01/2013    Hepatic Function Panel  Recent Labs  11/01/13 1234  PROT 6.7  ALBUMIN 3.5  AST 23  ALT 11  ALKPHOS 78  BILITOT 0.2*    Recent Labs   11/02/13 0440  CHOL 159   No results found for this basename: PROTIME,  in the last 72 hours  Studies/Results: Dg Abd Acute W/chest  11/01/2013   CLINICAL DATA:  Chest pain.  Nausea and vomiting.  EXAM: ACUTE ABDOMEN SERIES (ABDOMEN 2 VIEW & CHEST 1 VIEW)  COMPARISON:  Abdomen and pelvis CT, 02/26/2013. Chest radiograph, 05/14/2007.  FINDINGS: Normal bowel gas pattern.  No free air.  A bowel anastomosis staple line lies in the central pelvis. The soft tissues are otherwise unremarkable.  There is a small hiatal hernia. The cardiac silhouette is mildly enlarged. The lungs are clear.  The bony structures are demineralized with significant lumbar spine degenerative disease.  IMPRESSION: 1. No acute finding. No obstruction or free air. No active disease of the chest radiograph.   Electronically Signed   By: Lajean Manes M.D.   On: 11/01/2013 13:09    Medications: I have reviewed the patient's current medications. Scheduled Meds: . aspirin EC  325 mg Oral Daily  . atorvastatin  40 mg Oral q1800  . enoxaparin (LOVENOX) injection  30 mg Subcutaneous Q24H  . irbesartan  150 mg Oral Daily  . metoprolol tartrate  50 mg Oral BID  . pantoprazole  40 mg Oral Daily  . sodium chloride  3 mL  Intravenous Q12H   Continuous Infusions: . sodium chloride 75 mL/hr at 11/02/13 0507   PRN Meds:.acetaminophen, acetaminophen, morphine injection, nitroGLYCERIN, ondansetron (ZOFRAN) IV, ondansetron, oxyCODONE  Assessment/Plan: Principal Problem:   Chest pain with moderate risk of acute coronary syndrome Active Problems:   New onset a-fib   HYPOTHYROIDISM   HYPERLIPIDEMIA   ANEMIA-NOS   ANXIETY   HYPERTENSION- no significant RAS by MRA 2002- (<30% LRAS)   Irritable bowel syndrome   Chronic renal insufficiency, stage III (moderate)   PVD- 123456 LICA, [id number] RICA by doppler 2009   Sinus bradycardia- asymptomatic   GERD (gastroesophageal reflux disease)   LBBB (left bundle branch block)- new in AF   PAF  Hx- stable, in NSR for years  Plan, no pain until stress test, negative Troponin.  HR mostly controlled.  Await nuc results.  Needs GI consult.  Lexiscan myoview completed without complications.  She did have chest pressure.    LOS: 1 day   Time spent with pt. :15 minutes. [facility]  Nurse Practitioner Certified Pager [phone] or after 5pm and on weekends call [phone] 11/02/2013, 11:39 AM  I saw examined the patient while in nuclear medicine along with Cecilie Kicks. She had a little discomfort during the LexiScan fusion caliber does not resolve. No recurrent anginal symptoms. Her heart rate is borderline controlled currently. We'll follow results of LexiScan to evaluate for possible ischemia given atrial fibrillation with chest pain on admission. Otherwise is likely to simply be from A. fib RVR. She is anemic, with stable hemoglobin. May need to have GI versus hematology oncology evaluation prior to initiating iron therapy plus or minus NOAC for A. fib anticoagulation. In the setting of mild anemia, I would start with Eliquis for increased safety profile in the NOAC category. Would monitor symptomatically over the next few weeks to see she converted spontaneously, if not would consider return for outpatient elective cardioversion after 3 weeks of anticoagulation. She does not seem to be unstable to consider TEE cardioversion at this time. For now she remains on an as apparent at prophylactic dosing. I would like to see her rate slightly better controlled prior to discharge. Anticipate that she may not be ready for discharge until tomorrow. I will increase her beta blocker dose to 75 twice a day. May need to consider backing off ARB dose if additional rate control is required.  Depending on the results of her stress test, if negative for ischemia and rate is adequately controlled, could anticipate discharge as early as tomorrow if anemia has been evaluated. Would start with Eliquis at 2.5 mg  twice a day based on her age and GFR Did not initiate any granulation until we are confident of adequate anemia evaluation and no ischemia on stress test.  A followup stress test results today and. If abnormal will discuss driving with her, if normal will discuss in the morning.  With your history of bradycardia, would consider discharge with event monitor. This can be set up in our office next week.  [name], M.D., M.S. [facility] [address] [place], [place]  [address]  [phone] Pager # [phone] 11/02/2013 12:55 PM

## 2013-11-02 NOTE — Progress Notes (Signed)
UR completed. Patient changed to inpatient r/t continuing to require monitoring of HR and anemia.

## 2013-11-02 NOTE — Telephone Encounter (Signed)
I called in script 

## 2013-11-03 DIAGNOSIS — I519 Heart disease, unspecified: Secondary | ICD-10-CM

## 2013-11-03 DIAGNOSIS — R0789 Other chest pain: Secondary | ICD-10-CM | POA: Diagnosis present

## 2013-11-03 DIAGNOSIS — I059 Rheumatic mitral valve disease, unspecified: Secondary | ICD-10-CM

## 2013-11-03 HISTORY — DX: Heart disease, unspecified: I51.9

## 2013-11-03 LAB — CBC
HCT: 28.1 % — ABNORMAL LOW (ref 36.0–46.0)
MCHC: 32.4 g/dL (ref 30.0–36.0)
Platelets: 176 10*3/uL (ref 150–400)
RDW: 14.9 % (ref 11.5–15.5)

## 2013-11-03 LAB — BASIC METABOLIC PANEL
BUN: 29 mg/dL — ABNORMAL HIGH (ref 6–23)
Creatinine, Ser: 1.54 mg/dL — ABNORMAL HIGH (ref 0.50–1.10)
GFR calc Af Amer: 34 mL/min — ABNORMAL LOW (ref >90)
GFR calc non Af Amer: 29 mL/min — ABNORMAL LOW (ref >90)
Sodium: 136 mEq/L (ref 135–145)

## 2013-11-03 LAB — FOLATE RBC: RBC Folate: 622 ng/mL — ABNORMAL HIGH (ref >280)

## 2013-11-03 MED ORDER — IRBESARTAN 150 MG PO TABS: 150.0000 mg | ORAL_TABLET | Freq: Every day | ORAL | Status: DC

## 2013-11-03 MED ORDER — CYANOCOBALAMIN 1000 MCG PO TABS: 1000.0000 ug | ORAL_TABLET | Freq: Every day | ORAL | Status: DC

## 2013-11-03 MED ORDER — APIXABAN 2.5 MG PO TABS: 2.5000 mg | ORAL_TABLET | Freq: Two times a day (BID) | ORAL | Status: DC

## 2013-11-03 MED ORDER — METOPROLOL TARTRATE 50 MG PO TABS: 75.0000 mg | ORAL_TABLET | Freq: Two times a day (BID) | ORAL | Status: DC

## 2013-11-03 NOTE — Progress Notes (Signed)
  Echocardiogram 2D Echocardiogram has been performed.  [name] 11/03/2013, 9:38 AM

## 2013-11-03 NOTE — Discharge Summary (Signed)
Physician Discharge Summary  [name] [id number] DOB: May 10, 1924 DOA: 11/01/2013  PCP: [name], MD  Admit date: 11/01/2013 Discharge date: 11/03/2013  Time spent: 35 minutes  Recommendations for Outpatient Follow-up:  1. Patient's Norvasc, Micardis and chlorthalidone are being discontinued 2. Patient is being started on metoprolol 75 by mouth twice a day plus Avapro 3. Because of issues with diarrhea, recommendation for colchicine to be discontinued 4. Eliquis 2.5 mg by mouth twice a day started for anticoagulation given extra fibrillation 5. B12 1000 mcg by mouth daily started for anemia and low B12 levels 6. Patient will follow up with cardiology next week. They will call to set up appointment 7. Patient will follow up with PCP in the next one month  Discharge Diagnoses:  Principal Problem:   Chest pain with moderate risk of acute coronary syndrome Active Problems:   HYPOTHYROIDISM   HYPERLIPIDEMIA   ANEMIA-NOS   ANXIETY   HYPERTENSION- no significant RAS by MRA 2002- (<30% LRAS)   Irritable bowel syndrome   Chronic renal insufficiency, stage III (moderate)   PVD- [id number] LICA, [id number] RICA by doppler 2009   Sinus bradycardia- asymptomatic   GERD (gastroesophageal reflux disease)   New onset a-fib   LBBB (left bundle branch block)- new in AF   PAF Hx- stable, in NSR for years   Atypical chest pain   Discharge Condition: Improved, being discharged home  Diet recommendation: Low-sodium heart healthy  Filed Weights   11/01/13 1549  Weight: 69.9 kg (154 lb 1.6 oz)    History of present illness:  On 47/32:79 year old female past mental history stage III chronic kidney disease, hypertension and GERD presented to emergency room complaining of tight exertional pressure lasting approximately 20 minutes. In emergency room patient was noted to have negative troponin EKG noting new diagnosis of atrial fibrillation.  Hospital Course:  Atrial fibrillation: Patient  seen by cardiology. Able to be changed to metoprolol twice a day from IV Cardizem and rate controlled. Echocardiogram did note signs of persistent left and right severe dilatation. Recommended for anticoagulation. She was noted to be moderately iron deficient anemic, however hemoglobin on day of discharge actually increased. No signs of acute bleeding. Eliquis recommended given lower bleeding risk. Possible cardioversion in the next 3-4 weeks if patient is still feeling run down despite rate controlled  Atypical chest pain: Troponins negative with no acute abnormality chest x-ray. EKG noting only atrial fibrillation. Patient in the cardiology and underwent nuclear medicine stress test noting no signs of reversible ischemia and preserved ejection fraction.  Diarrhea: Patient's history of chronic IBS related to distant bowel surgery. No signs of any acute infectious process. Recommendation upon discharge is also discontinue call to see which can contribute to this.  Stage III chronic kidney disease: Remain stable.  History of hypothyroidism: TSH within normal limits patient continued on home dose of Synthroid  Normocytic anemia, chronic, may be due in part to renal parenchymal disease  - Iron studies: Iron deficiency , patient intolerant of oral iron due to nausea. She was given one dose of IV iron infusion - B12 deficiency likely, level 240 and started on 1000 mcg daily Rest of workup including Folate, SPEP/UPEP/IFE, Occult stool, TSH wnl  - Last colonoscopy was 04/2012 by Dr. Fuller Plan and was negative for polyps or other concerning lesions . Also had EGD two months ago with esophagitis and erosive gastritis. Remain on PPI.   Procedures:  Echocardiogram done 12/23: Mild LVH with concentric hypertrophy and normal systolic function  with EF of 55-60 percent. Elevated mean left atrial filling pressure. Diastolic abnormality could not be fully excluded. Ventricular septum notes balance, consistent with  intraventricular conduction delay. Mild mitral regurg and trivial aortic regurg with severe dilatation of left and right atrium. Mild systolic pulmonary artery pressure at 47.  Consultations:  Dr. Harding-cardiology  Discharge Exam: Filed Vitals:   11/03/13 0604  BP: 155/75  Pulse: 73  Temp: 97.8 F (36.6 C)  Resp: 16    General: Alert and oriented x3, no acute distress Cardiovascular: Irregular rhythm, rate controlled, soft 2/6 systolic ejection murmur Respiratory: Clear to auscultation bilaterally  Discharge Instructions  Discharge Orders   Future Appointments Provider Department Dept Phone   11/17/2013 11:20 AM Erlene Quan, PA-C [facility] [phone]   Future Orders Complete By Expires   Diet - low sodium heart healthy  As directed    Increase activity slowly  As directed        Medication List    STOP taking these medications       amLODipine 2.5 MG tablet  Commonly known as:  NORVASC     amLODipine 5 MG tablet  Commonly known as:  NORVASC     chlorthalidone 50 MG tablet  Commonly known as:  HYGROTON     colchicine 0.6 MG tablet     telmisartan 80 MG tablet  Commonly known as:  MICARDIS      TAKE these medications       apixaban 2.5 MG Tabs tablet  Commonly known as:  ELIQUIS  Take 1 tablet (2.5 mg total) by mouth 2 (two) times daily.     aspirin 81 MG tablet  Take 81 mg by mouth daily.     cyanocobalamin 1000 MCG tablet  Take 1 tablet (1,000 mcg total) by mouth daily.     hyoscyamine 0.125 MG tablet  Commonly known as:  LEVSIN, ANASPAZ  Take 1-2 tablets by mouth or under tongue every 4 hours as needed before meals     irbesartan 150 MG tablet  Commonly known as:  AVAPRO  Take 1 tablet (150 mg total) by mouth daily.     levothyroxine 88 MCG tablet  Commonly known as:  SYNTHROID, LEVOTHROID  TAKE 1 TABLET BY MOUTH EVERY DAY BEFORE BREAKFAST     metoprolol 50 MG tablet  Commonly known as:  LOPRESSOR  Take 1.5 tablets (75 mg  total) by mouth 2 (two) times daily.     omeprazole 40 MG capsule  Commonly known as:  PRILOSEC  Take 1 capsule (40 mg total) by mouth daily.     temazepam 30 MG capsule  Commonly known as:  RESTORIL  Take 1 capsule by mouth daily.     traMADol 50 MG tablet  Commonly known as:  ULTRAM  Take 1 tablet (50 mg total) by mouth every 6 (six) hours as needed.       Allergies  Allergen Reactions  . Amoxicillin     REACTION: unspecified  . Codeine Phosphate     REACTION: unspecified  . Lipitor [Atorvastatin] Other (See Comments)    "makes legs jump all night"  . Penicillins     REACTION: nausea, swelling  . Xifaxan [Rifaximin] Other (See Comments)    unknown       Follow-up Information   Follow up with KILROY,LUKE K, PA-C. (office will call you)    Specialty:  Cardiology   Contact information:   [address] [place] [address] [phone]  Follow up with FRY,STEPHEN A, MD In 1 month.   Specialty:  Family Medicine   Contact information:   [place] [address] [phone]        The results of significant diagnostics from this hospitalization (including imaging, microbiology, ancillary and laboratory) are listed below for reference.    Significant Diagnostic Studies: Nm Myocar Multi W/spect W/wall Motion / Ef  11/02/2013   CLINICAL DATA:  Chest pain  EXAM: MYOCARDIAL IMAGING WITH SPECT (REST AND PHARMACOLOGIC-STRESS)  GATED LEFT VENTRICULAR WALL MOTION STUDY  LEFT VENTRICULAR EJECTION FRACTION  TECHNIQUE: Standard myocardial SPECT imaging was performed after resting intravenous injection of 10 mCi Tc-9m sestamibi. Subsequently, intravenous infusion of Lexiscan was performed under the supervision of the Cardiology staff. At peak effect of the drug, 30 mCi Tc-33m sestamibi was injected intravenously and standard myocardial SPECT imaging was performed. Quantitative gated imaging was also performed to evaluate left ventricular  wall motion, and estimate left ventricular ejection fraction.  COMPARISON:  None.  FINDINGS: SPECT: No definite inducible or reversible ischemia with pharmacologic stress. No fixed defects.  Wall motion: Grossly normal wall motion and thickening  Ejection fraction: Calculated ejection fraction is 60%.  IMPRESSION: Negative for inducible ischemia.  60% ejection fraction   Electronically Signed   By: Daryll Brod M.D.   On: 11/02/2013 13:18   Dg Abd Acute W/chest  11/01/2013    IMPRESSION: 1. No acute finding. No obstruction or free air. No active disease of the chest radiograph.   Electronically Signed   By: Lajean Manes M.D.   On: 11/01/2013 13:09    Microbiology: No results found for this or any previous visit (from the past 240 hour(s)).   Labs: Basic Metabolic Panel:  Recent Labs Lab 11/01/13 1234 11/01/13 1632 11/02/13 0440 11/03/13 1039  NA 139  --  136 136  K 5.0  --  4.7 4.6  CL 106  --  105 104  CO2 20  --  21 19  GLUCOSE 102*  --  99 101*  BUN 41*  --  39* 29*  CREATININE 1.62* 1.63* 1.94* 1.54*  CALCIUM 8.6  --  8.1* 8.7   Liver Function Tests:  Recent Labs Lab 11/01/13 1234  AST 23  ALT 11  ALKPHOS 78  BILITOT 0.2*  PROT 6.7  ALBUMIN 3.5    Recent Labs Lab 11/01/13 1234  LIPASE 51   No results found for this basename: AMMONIA,  in the last 168 hours CBC:  Recent Labs Lab 11/01/13 1234 11/01/13 1632 11/02/13 0440 11/03/13 1039  WBC 4.5 4.0 3.4* 3.8*  NEUTROABS 3.3  --   --   --   HGB 9.1* 8.5* 8.2* 9.1*  HCT 28.4* 26.7* 25.6* 28.1*  MCV 87.9 87.3 86.8 88.1  PLT 227 181 178 176   Cardiac Enzymes:  Recent Labs Lab 11/01/13 1234 11/01/13 1631 11/01/13 2305 11/02/13 0440  TROPONINI <0.30 <0.30 <0.30 <0.30      Signed:  Gevena Barre K  [facility] 11/03/2013, 4:20 PM

## 2013-11-03 NOTE — Progress Notes (Signed)
Eliquis free 30-Day Trial Offer card given to pt. Pt activated this card today, while this CM was in the room.

## 2013-11-05 LAB — UIFE/LIGHT CHAINS/TP QN, 24-HR UR
Alpha 1, Urine: DETECTED — AB
Free Kappa Lt Chains,Ur: 2.91 mg/dL — ABNORMAL HIGH (ref 0.14–2.42)
Free Lambda Lt Chains,Ur: 0.24 mg/dL (ref 0.02–0.67)
Gamma Globulin, Urine: DETECTED — AB
Total Protein, Urine: 4.9 mg/dL

## 2013-11-05 NOTE — Progress Notes (Signed)
Discharge summary sent to payer through [facility]  

## 2013-11-08 ENCOUNTER — Telehealth: Payer: Self-pay | Admitting: Cardiology

## 2013-11-08 LAB — PROTEIN ELECTROPHORESIS, SERUM
Beta 2: 4 % (ref 3.2–6.5)
Beta Globulin: 7.9 % — ABNORMAL HIGH (ref 4.7–7.2)
M-Spike, %: NOT DETECTED g/dL

## 2013-11-08 NOTE — Telephone Encounter (Signed)
Calling for mother.  [name] mother was put on new meds when discharged from hospital 12/24 . Said new medicines are causing her to have no energy . Please call.

## 2013-11-08 NOTE — Telephone Encounter (Signed)
Returned call and pt verified x 2 w/ Butch Penny, pt's daughter-in-law.  Stated pt has been feeling tired and w/ a dry mouth.  Stated she is thirsty all of the time.  Also stated pt is taking the B12 and thinks she should be feeling better by now.  Butch Penny informed it may take a week or two for the B12 to build up for pt to start to notice improvement in fatigue.  Also informed metoprolol may be causing pt to feel tired and fatigued b/c it will lower BP/HR.  RN asked Butch Penny if pt has been checking BP and she stated she isn't sure.  Stated she will find out.  Advised they call back if BP <100/60 and pt still symptomatic as it may need to be adjusted.  Also advised pt increase water intake as her BP was elevated at discharge and now that she doesn't have IV fluids she needs to stay hydrated orally for volume.  Butch Penny verbalized understanding and agreed w/ plan.  Keep appt on 1.7.14 at 11:20am.  Message forwarded to Dr. Ellyn Hack [facility]).

## 2013-11-08 NOTE — Telephone Encounter (Signed)
This sounds reasonable.    I only saw her for her [place] while admitted.  Beta Blockers can make her feel a bit tired.  We usually try to give it a month or so.  May need to back down the dose.  As for dry mouth - BB do not usually have this effect.  In fact the Chlorthalidone that was stopped, would me a more likely culprit.    Agree with oral hydration & we can discuss in ROV appt.  [name], MD

## 2013-11-17 ENCOUNTER — Ambulatory Visit (INDEPENDENT_AMBULATORY_CARE_PROVIDER_SITE_OTHER): Payer: Medicare Other | Admitting: Cardiology

## 2013-11-17 ENCOUNTER — Encounter: Payer: Self-pay | Admitting: Cardiology

## 2013-11-17 VITALS — BP 160/80 | HR 60 | Ht 65.0 in | Wt 154.0 lb

## 2013-11-17 DIAGNOSIS — D649 Anemia, unspecified: Secondary | ICD-10-CM

## 2013-11-17 DIAGNOSIS — N183 Chronic kidney disease, stage 3 unspecified: Secondary | ICD-10-CM

## 2013-11-17 DIAGNOSIS — R5381 Other malaise: Secondary | ICD-10-CM

## 2013-11-17 DIAGNOSIS — Z7901 Long term (current) use of anticoagulants: Secondary | ICD-10-CM

## 2013-11-17 DIAGNOSIS — R001 Bradycardia, unspecified: Secondary | ICD-10-CM

## 2013-11-17 DIAGNOSIS — I4891 Unspecified atrial fibrillation: Secondary | ICD-10-CM

## 2013-11-17 DIAGNOSIS — R5383 Other fatigue: Secondary | ICD-10-CM | POA: Insufficient documentation

## 2013-11-17 DIAGNOSIS — I447 Left bundle-branch block, unspecified: Secondary | ICD-10-CM

## 2013-11-17 DIAGNOSIS — I498 Other specified cardiac arrhythmias: Secondary | ICD-10-CM

## 2013-11-17 DIAGNOSIS — I10 Essential (primary) hypertension: Secondary | ICD-10-CM

## 2013-11-17 DIAGNOSIS — I480 Paroxysmal atrial fibrillation: Secondary | ICD-10-CM

## 2013-11-17 DIAGNOSIS — R079 Chest pain, unspecified: Secondary | ICD-10-CM

## 2013-11-17 MED ORDER — METOPROLOL TARTRATE 50 MG PO TABS: 50.0000 mg | ORAL_TABLET | Freq: Two times a day (BID) | ORAL | Status: DC

## 2013-11-17 MED ORDER — APIXABAN 2.5 MG PO TABS: 2.5000 mg | ORAL_TABLET | Freq: Two times a day (BID) | ORAL | Status: DC

## 2013-11-17 MED ORDER — AMIODARONE HCL 200 MG PO TABS: 200.0000 mg | ORAL_TABLET | Freq: Two times a day (BID) | ORAL | Status: DC

## 2013-11-17 NOTE — Patient Instructions (Signed)
Decrease Metoprolol to 50 mg twice a day Start Amiodarone 200 mg twice a day See [name] in two weeks.

## 2013-11-17 NOTE — Progress Notes (Signed)
11/17/2013 [name]   1924/03/30  [id number]  Primary Physicia [name], MD Primary Cardiologist: [name] (per pt)  HPI:  Pleasant 80 y/o female with a remote history of PAF. She had been in NSR for years. We saw he in the ER at [facility] 11/01/13 when she presented with multiple somatic comlpaints and was noted to be in AF with a new LBBB. Beta blocker was added. A Myoview was low risk. Echo showed good LVF with RAE, LAE, and PA pressures of 47 mmHg. TSH, LFTs, electrolytes were WNL. She was noted to be anemic. She had prior GI evaluation in June 2013. She was iron deficient but has had an intolerance to po Iron. She did receive a dose of IV iron during her hospitalization. She was in NSR, on Eliquis at discharge 11/03/13. She tells me she good for a couple of days but has had exertional fatigue since. She says she doesn't "feel like myself". In the office today she is noted to be in AF with a VR of 72.    Current Outpatient Prescriptions  Medication Sig Dispense Refill  . apixaban (ELIQUIS) 2.5 MG TABS tablet Take 1 tablet (2.5 mg total) by mouth 2 (two) times daily.  60 tablet  0  . hyoscyamine (LEVSIN, ANASPAZ) 0.125 MG tablet Take 1-2 tablets by mouth or under tongue every 4 hours as needed before meals  360 tablet  5  . irbesartan (AVAPRO) 150 MG tablet Take 1 tablet (150 mg total) by mouth daily.  30 tablet  0  . levothyroxine (SYNTHROID, LEVOTHROID) 88 MCG tablet TAKE 1 TABLET BY MOUTH EVERY DAY BEFORE BREAKFAST  30 tablet  11  . omeprazole (PRILOSEC) 40 MG capsule Take 1 capsule (40 mg total) by mouth daily.  30 capsule  11  . temazepam (RESTORIL) 30 MG capsule Take 1 capsule by mouth daily.      . traMADol (ULTRAM) 50 MG tablet Take 1 tablet (50 mg total) by mouth every 6 (six) hours as needed.  60 tablet  2  . vitamin B-12 1000 MCG tablet Take 1 tablet (1,000 mcg total) by mouth daily.  30 tablet  0  . amiodarone (PACERONE) 200 MG tablet Take 1 tablet (200 mg total) by mouth 2  (two) times daily.  60 tablet  5  . aspirin 81 MG tablet Take 81 mg by mouth daily.        . metoprolol (LOPRESSOR) 50 MG tablet Take 1 tablet (50 mg total) by mouth 2 (two) times daily.  180 tablet  3   No current facility-administered medications for this visit.    Allergies  Allergen Reactions  . Amoxicillin     REACTION: unspecified  . Codeine Phosphate     REACTION: unspecified  . Lipitor [Atorvastatin] Other (See Comments)    "makes legs jump all night"  . Penicillins     REACTION: nausea, swelling  . Xifaxan [Rifaximin] Other (See Comments)    unknown    History   Social History  . Marital Status: Widowed    Spouse Name: N/A    Number of Children: N/A  . Years of Education: N/A   Occupational History  . Not on file.   Social History Main Topics  . Smoking status: Never Smoker   . Smokeless tobacco: Never Used  . Alcohol Use: Yes     Comment: social wine intake  . Drug Use: No  . Sexual Activity: No   Other Topics Concern  .  Not on file   Social History Narrative  . No narrative on file     Review of Systems: General: negative for chills, fever, night sweats or weight changes.  Cardiovascular: negative for chest pain, dyspnea on exertion, edema, orthopnea, palpitations, paroxysmal nocturnal dyspnea or shortness of breath Dermatological: negative for rash Respiratory: negative for cough or wheezing Urologic: negative for hematuria Abdominal: negative for nausea, vomiting, diarrhea, bright red blood per rectum, melena, or hematemesis Neurologic: negative for visual changes, syncope, or dizziness All other systems reviewed and are otherwise negative except as noted above.    Blood pressure 160/80, pulse 60, height 5\' 5"  (1.651 m), weight 154 lb (69.854 kg).  General appearance: alert, cooperative and no distress Lungs: clear to auscultation bilaterally Heart: irregularly irregular rhythm Extremities: no edema Neurologic: Grossly normal  EKG AF,  IVCD, rate 72  ASSESSMENT AND PLAN:   PAF Hx- stable, in NSR for years- recurrent PAF 11/01/13 She was in NSR when she was discharged but noted increasing exertional fatigue a few days later.  Fatigue- related to AF .  Chronic renal insufficiency, stage III (moderate) .  HYPERTENSION- no significant RAS by MRA 2002- (<30% LRAS) Controlled. Echo showed good LVF, RAE, LAE, PA pressure 47 mmHg  LBBB (left bundle branch block)- new 12/22 when in AF Appears to be rate related  Chest pain with AF 12/22- new LBBB- low risk Myoview .  ANEMIA-NOS GI work up June 2013 Dr Jacinto Halim (garstitis on endo)  Chronic anticoagulation- Eliquis .  Bradycardia when in NSR .    PLAN  I discussed Ms Sweazy with Dr Debara Pickett. We added Amiodarone 200 mg BID and cut back her Lopressor to 50 mg BID, (this may need to be decreased further in a week or two). She will follow up with [name] in two weeks, if she hasn't converted spontaneously to NSR we'll consider DCCV then.  Deadrian Toya KPA-C 11/17/2013 1:04 PM

## 2013-11-17 NOTE — Assessment & Plan Note (Signed)
Controlled. Echo showed good LVF, RAE, LAE, PA pressure 47 mmHg

## 2013-11-17 NOTE — Assessment & Plan Note (Signed)
GI work up June 2013 Dr Jacinto Halim (garstitis on endo)

## 2013-11-17 NOTE — Assessment & Plan Note (Signed)
She was in NSR when she was discharged but noted increasing exertional fatigue a few days later.

## 2013-11-17 NOTE — Assessment & Plan Note (Signed)
Appears to be rate related

## 2013-11-28 ENCOUNTER — Other Ambulatory Visit: Payer: Self-pay | Admitting: Family Medicine

## 2013-11-29 NOTE — Telephone Encounter (Signed)
Call in #30 with 5 rf 

## 2013-12-01 ENCOUNTER — Encounter: Payer: Self-pay | Admitting: Cardiology

## 2013-12-01 ENCOUNTER — Ambulatory Visit (INDEPENDENT_AMBULATORY_CARE_PROVIDER_SITE_OTHER): Payer: Medicare Other | Admitting: Cardiology

## 2013-12-01 VITALS — BP 150/80 | HR 44 | Ht 65.0 in | Wt 159.9 lb

## 2013-12-01 DIAGNOSIS — Z7901 Long term (current) use of anticoagulants: Secondary | ICD-10-CM

## 2013-12-01 DIAGNOSIS — N183 Chronic kidney disease, stage 3 unspecified: Secondary | ICD-10-CM

## 2013-12-01 DIAGNOSIS — R001 Bradycardia, unspecified: Secondary | ICD-10-CM

## 2013-12-01 DIAGNOSIS — D649 Anemia, unspecified: Secondary | ICD-10-CM

## 2013-12-01 DIAGNOSIS — I480 Paroxysmal atrial fibrillation: Secondary | ICD-10-CM

## 2013-12-01 DIAGNOSIS — R079 Chest pain, unspecified: Secondary | ICD-10-CM

## 2013-12-01 DIAGNOSIS — I4891 Unspecified atrial fibrillation: Secondary | ICD-10-CM

## 2013-12-01 DIAGNOSIS — I498 Other specified cardiac arrhythmias: Secondary | ICD-10-CM

## 2013-12-01 DIAGNOSIS — E039 Hypothyroidism, unspecified: Secondary | ICD-10-CM

## 2013-12-01 LAB — CBC
HCT: 33.8 % — ABNORMAL LOW (ref 36.0–46.0)
Hemoglobin: 10.9 g/dL — ABNORMAL LOW (ref 12.0–15.0)
MCH: 28.9 pg (ref 26.0–34.0)
MCHC: 32.2 g/dL (ref 30.0–36.0)
MCV: 89.7 fL (ref 78.0–100.0)
Platelets: 226 10*3/uL (ref 150–400)
RBC: 3.77 MIL/uL — ABNORMAL LOW (ref 3.87–5.11)
RDW: 18.4 % — ABNORMAL HIGH (ref 11.5–15.5)
WBC: 4.6 10*3/uL (ref 4.0–10.5)

## 2013-12-01 NOTE — Assessment & Plan Note (Signed)
She has seen Dr Marin Olp for this in the past. During her recent hospitalization she received IV Iron (can't tolerate po Iron).  I will check CBC today but have suggested she contact him for follow up.

## 2013-12-01 NOTE — Progress Notes (Signed)
12/01/2013 [name]   06-05-1924  [id number]  Primary Physicia Lindsey Morale, MD Primary Cardiologist: [name]  HPI: Pleasant 80 y/o female with a remote history of PAF. She had been in NSR for years. We saw he in the ER at [facility] 11/01/13 when she presented with multiple somatic comlpaints and was noted to be in AF with a new LBBB. Beta blocker was added. A Myoview was low risk. Echo showed good LVF with RAE, LAE, and PA pressures of 47 mmHg. TSH, LFTs, electrolytes were WNL. She was noted to be anemic. She had seen Dr Sonny Dandy, then Dr Marin Olp for this in the past. She had prior GI evaluation in June 2013. She is iron deficient but has had an intolerance to po Iron. She did receive a dose of IV iron during her hospitalization.         She was in NSR, on Eliquis at discharge 11/03/13. She was sent home on Metoprolol 75 mg BID. I saw her in the office 11/17/12 and she was in AF with a rate in the 70s. She was symptomatic with this. We cut her Metoprolol to 50 mg BID and added Amiodarone 200 mg BID. She is seen in the office today in follow up. She feels "weak" with DOE. No syncope or near syncope.  Her HR today is 44- NSR/SB/LBBB.     Current Outpatient Prescriptions  Medication Sig Dispense Refill  . apixaban (ELIQUIS) 2.5 MG TABS tablet Take 1 tablet (2.5 mg total) by mouth 2 (two) times daily.  60 tablet  6  . hyoscyamine (LEVSIN, ANASPAZ) 0.125 MG tablet Take 1-2 tablets by mouth or under tongue every 4 hours as needed before meals  360 tablet  5  . irbesartan (AVAPRO) 150 MG tablet Take 1 tablet (150 mg total) by mouth daily.  30 tablet  0  . levothyroxine (SYNTHROID, LEVOTHROID) 88 MCG tablet TAKE 1 TABLET BY MOUTH EVERY DAY BEFORE BREAKFAST  30 tablet  11  . omeprazole (PRILOSEC) 40 MG capsule Take 1 capsule (40 mg total) by mouth daily.  30 capsule  11  . temazepam (RESTORIL) 30 MG capsule TAKE 1 CAPSULE AT BEDTIME AS NEEDED  30 capsule  5  . traMADol (ULTRAM) 50 MG tablet Take 1 tablet  (50 mg total) by mouth every 6 (six) hours as needed.  60 tablet  2  . vitamin B-12 1000 MCG tablet Take 1 tablet (1,000 mcg total) by mouth daily.  30 tablet  0  . aspirin 81 MG tablet Take 81 mg by mouth daily.         No current facility-administered medications for this visit.    Allergies  Allergen Reactions  . Amoxicillin     REACTION: unspecified  . Codeine Phosphate     REACTION: unspecified  . Lipitor [Atorvastatin] Other (See Comments)    "makes legs jump all night"  . Penicillins     REACTION: nausea, swelling  . Xifaxan [Rifaximin] Other (See Comments)    unknown    History   Social History  . Marital Status: Widowed    Spouse Name: N/A    Number of Children: N/A  . Years of Education: N/A   Occupational History  . Not on file.   Social History Main Topics  . Smoking status: Never Smoker   . Smokeless tobacco: Never Used  . Alcohol Use: Yes     Comment: social wine intake  . Drug Use: No  . Sexual Activity: No  Other Topics Concern  . Not on file   Social History Narrative  . No narrative on file     Review of Systems: General: negative for chills, fever, night sweats or weight changes.  Cardiovascular: negative for chest pain, dyspnea on exertion, edema, orthopnea, palpitations, paroxysmal nocturnal dyspnea  Dermatological: negative for rash Respiratory: negative for cough or wheezing Urologic: negative for hematuria Abdominal: negative for nausea, vomiting, diarrhea, bright red blood per rectum, melena, or hematemesis Neurologic: negative for visual changes, syncope, or dizziness All other systems reviewed and are otherwise negative except as noted above.    Blood pressure 150/80, pulse 44, height 5\' 5"  (1.651 m), weight 159 lb 14.4 oz (72.53 kg).  General appearance: alert, cooperative, appears stated age and no distress Lungs: clear to auscultation bilaterally Heart: regular rate and rhythm and slow rate Extremities: no edema  EKG NSR,  LBBB, HR 44  ASSESSMENT AND PLAN:   Bradycardia when in NSR She is in NSR today but HR is 44 and she feels weak  ANEMIA-NOS She has seen Dr Marin Olp for this in the past. During her recent hospitalization she received IV Iron (can't tolerate po Iron).   Chest pain with AF 12/22- new LBBB- low risk Myoview .  Chronic renal insufficiency, stage III (moderate) .  Chronic anticoagulation- Eliquis .  HYPOTHYROIDISM TSH WNL 12/14   PLAN  I stopped her Metoprolol and decreased her Amiodarone to 200 mg daily. Her son accompanied her today. We discussed the possibility she may ultimately require a pacemaker. She asked that we follow up on her anemia. I will check CBC today but have suggested she contact Dr Marin Olp for follow up, he has seen her in the past, (her daughter was a cancer pt of Dr Antonieta Pert).  I'll see her back next week to see if her HR and fatigue have improved.    Kyomi Hector KPA-C 12/01/2013 11:45 AM

## 2013-12-01 NOTE — Patient Instructions (Signed)
Stop Metoprolol. Decrease Amiodarone to 200 mg daily. Lab today- CBC. Contact Dr Marin Olp for anemia follow up. Return to clinic in one week- (see Lurena Joiner)

## 2013-12-01 NOTE — Assessment & Plan Note (Signed)
She is in NSR today but HR is 44 and she feels weak

## 2013-12-01 NOTE — Assessment & Plan Note (Signed)
TSH WNL 12/14

## 2013-12-02 ENCOUNTER — Telehealth: Payer: Self-pay | Admitting: Cardiology

## 2013-12-02 NOTE — Telephone Encounter (Signed)
Wants to know if her lab results are back from yesterday.

## 2013-12-03 ENCOUNTER — Emergency Department (HOSPITAL_COMMUNITY): Payer: Medicare Other

## 2013-12-03 ENCOUNTER — Inpatient Hospital Stay (HOSPITAL_COMMUNITY)
Admission: EM | Admit: 2013-12-03 | Discharge: 2013-12-07 | DRG: 292 | Disposition: A | Discharge status: Home / Self Care | Payer: Medicare Other | Attending: Internal Medicine | Admitting: Internal Medicine

## 2013-12-03 ENCOUNTER — Encounter (HOSPITAL_COMMUNITY): Payer: Self-pay | Admitting: Emergency Medicine

## 2013-12-03 DIAGNOSIS — Z8679 Personal history of other diseases of the circulatory system: Secondary | ICD-10-CM

## 2013-12-03 DIAGNOSIS — R778 Other specified abnormalities of plasma proteins: Secondary | ICD-10-CM

## 2013-12-03 DIAGNOSIS — N183 Chronic kidney disease, stage 3 unspecified: Secondary | ICD-10-CM | POA: Diagnosis present

## 2013-12-03 DIAGNOSIS — I4819 Other persistent atrial fibrillation: Secondary | ICD-10-CM | POA: Diagnosis present

## 2013-12-03 DIAGNOSIS — I480 Paroxysmal atrial fibrillation: Secondary | ICD-10-CM

## 2013-12-03 DIAGNOSIS — I5033 Acute on chronic diastolic (congestive) heart failure: Principal | ICD-10-CM | POA: Diagnosis present

## 2013-12-03 DIAGNOSIS — E785 Hyperlipidemia, unspecified: Secondary | ICD-10-CM | POA: Diagnosis present

## 2013-12-03 DIAGNOSIS — R5381 Other malaise: Secondary | ICD-10-CM

## 2013-12-03 DIAGNOSIS — I495 Sick sinus syndrome: Secondary | ICD-10-CM | POA: Diagnosis present

## 2013-12-03 DIAGNOSIS — I739 Peripheral vascular disease, unspecified: Secondary | ICD-10-CM | POA: Diagnosis present

## 2013-12-03 DIAGNOSIS — Z85828 Personal history of other malignant neoplasm of skin: Secondary | ICD-10-CM

## 2013-12-03 DIAGNOSIS — I4891 Unspecified atrial fibrillation: Secondary | ICD-10-CM | POA: Diagnosis present

## 2013-12-03 DIAGNOSIS — I119 Hypertensive heart disease without heart failure: Secondary | ICD-10-CM

## 2013-12-03 DIAGNOSIS — K589 Irritable bowel syndrome without diarrhea: Secondary | ICD-10-CM | POA: Diagnosis present

## 2013-12-03 DIAGNOSIS — R748 Abnormal levels of other serum enzymes: Secondary | ICD-10-CM | POA: Diagnosis present

## 2013-12-03 DIAGNOSIS — Z79899 Other long term (current) drug therapy: Secondary | ICD-10-CM

## 2013-12-03 DIAGNOSIS — Z7901 Long term (current) use of anticoagulants: Secondary | ICD-10-CM

## 2013-12-03 DIAGNOSIS — I447 Left bundle-branch block, unspecified: Secondary | ICD-10-CM | POA: Diagnosis present

## 2013-12-03 DIAGNOSIS — I130 Hypertensive heart and chronic kidney disease with heart failure and stage 1 through stage 4 chronic kidney disease, or unspecified chronic kidney disease: Secondary | ICD-10-CM | POA: Diagnosis present

## 2013-12-03 DIAGNOSIS — I509 Heart failure, unspecified: Secondary | ICD-10-CM | POA: Diagnosis present

## 2013-12-03 DIAGNOSIS — Z885 Allergy status to narcotic agent status: Secondary | ICD-10-CM

## 2013-12-03 DIAGNOSIS — Z7982 Long term (current) use of aspirin: Secondary | ICD-10-CM

## 2013-12-03 DIAGNOSIS — K219 Gastro-esophageal reflux disease without esophagitis: Secondary | ICD-10-CM

## 2013-12-03 DIAGNOSIS — I5032 Chronic diastolic (congestive) heart failure: Secondary | ICD-10-CM

## 2013-12-03 DIAGNOSIS — Z880 Allergy status to penicillin: Secondary | ICD-10-CM

## 2013-12-03 DIAGNOSIS — E039 Hypothyroidism, unspecified: Secondary | ICD-10-CM | POA: Diagnosis present

## 2013-12-03 DIAGNOSIS — R7989 Other specified abnormal findings of blood chemistry: Secondary | ICD-10-CM

## 2013-12-03 DIAGNOSIS — T502X5A Adverse effect of carbonic-anhydrase inhibitors, benzothiadiazides and other diuretics, initial encounter: Secondary | ICD-10-CM | POA: Diagnosis present

## 2013-12-03 DIAGNOSIS — R4181 Age-related cognitive decline: Secondary | ICD-10-CM | POA: Diagnosis present

## 2013-12-03 DIAGNOSIS — M109 Gout, unspecified: Secondary | ICD-10-CM | POA: Diagnosis present

## 2013-12-03 DIAGNOSIS — F411 Generalized anxiety disorder: Secondary | ICD-10-CM | POA: Diagnosis present

## 2013-12-03 DIAGNOSIS — R5383 Other fatigue: Secondary | ICD-10-CM

## 2013-12-03 DIAGNOSIS — I2789 Other specified pulmonary heart diseases: Secondary | ICD-10-CM | POA: Diagnosis present

## 2013-12-03 DIAGNOSIS — D649 Anemia, unspecified: Secondary | ICD-10-CM | POA: Diagnosis present

## 2013-12-03 DIAGNOSIS — R0789 Other chest pain: Secondary | ICD-10-CM

## 2013-12-03 DIAGNOSIS — N039 Chronic nephritic syndrome with unspecified morphologic changes: Secondary | ICD-10-CM

## 2013-12-03 DIAGNOSIS — M129 Arthropathy, unspecified: Secondary | ICD-10-CM | POA: Diagnosis present

## 2013-12-03 DIAGNOSIS — H8110 Benign paroxysmal vertigo, unspecified ear: Secondary | ICD-10-CM

## 2013-12-03 DIAGNOSIS — N289 Disorder of kidney and ureter, unspecified: Secondary | ICD-10-CM | POA: Diagnosis present

## 2013-12-03 DIAGNOSIS — J811 Chronic pulmonary edema: Secondary | ICD-10-CM | POA: Diagnosis present

## 2013-12-03 DIAGNOSIS — R079 Chest pain, unspecified: Secondary | ICD-10-CM

## 2013-12-03 DIAGNOSIS — M1990 Unspecified osteoarthritis, unspecified site: Secondary | ICD-10-CM

## 2013-12-03 DIAGNOSIS — J45909 Unspecified asthma, uncomplicated: Secondary | ICD-10-CM | POA: Diagnosis present

## 2013-12-03 HISTORY — DX: Paroxysmal atrial fibrillation: I48.0

## 2013-12-03 HISTORY — DX: Peripheral vascular disease, unspecified: I73.9

## 2013-12-03 HISTORY — DX: Hypertensive heart disease without heart failure: I11.9

## 2013-12-03 HISTORY — DX: Left bundle-branch block, unspecified: I44.7

## 2013-12-03 HISTORY — DX: Benign paroxysmal vertigo, unspecified ear: H81.10

## 2013-12-03 LAB — BASIC METABOLIC PANEL
BUN: 37 mg/dL — ABNORMAL HIGH (ref 6–23)
CO2: 22 mEq/L (ref 19–32)
CREATININE: 1.72 mg/dL — AB (ref 0.50–1.10)
Calcium: 9.4 mg/dL (ref 8.4–10.5)
Chloride: 104 mEq/L (ref 96–112)
GFR calc Af Amer: 30 mL/min — ABNORMAL LOW (ref >90)
GFR calc non Af Amer: 26 mL/min — ABNORMAL LOW (ref >90)
GLUCOSE: 132 mg/dL — AB (ref 70–99)
POTASSIUM: 4.8 meq/L (ref 3.7–5.3)
Sodium: 141 mEq/L (ref 137–147)

## 2013-12-03 LAB — CBC
HEMATOCRIT: 34.3 % — AB (ref 36.0–46.0)
HEMOGLOBIN: 11.2 g/dL — AB (ref 12.0–15.0)
MCH: 29.9 pg (ref 26.0–34.0)
MCHC: 32.7 g/dL (ref 30.0–36.0)
MCV: 91.7 fL (ref 78.0–100.0)
Platelets: 203 10*3/uL (ref 150–400)
RBC: 3.74 MIL/uL — ABNORMAL LOW (ref 3.87–5.11)
RDW: 18.2 % — ABNORMAL HIGH (ref 11.5–15.5)
WBC: 3.7 10*3/uL — ABNORMAL LOW (ref 4.0–10.5)

## 2013-12-03 LAB — URINALYSIS W MICROSCOPIC + REFLEX CULTURE
BILIRUBIN URINE: NEGATIVE
GLUCOSE, UA: NEGATIVE mg/dL
HGB URINE DIPSTICK: NEGATIVE
Ketones, ur: NEGATIVE mg/dL
Leukocytes, UA: NEGATIVE
Nitrite: NEGATIVE
Protein, ur: 30 mg/dL — AB
SPECIFIC GRAVITY, URINE: 1.02 (ref 1.005–1.030)
Urobilinogen, UA: 0.2 mg/dL (ref 0.0–1.0)
pH: 5 (ref 5.0–8.0)

## 2013-12-03 LAB — PRO B NATRIURETIC PEPTIDE: Pro B Natriuretic peptide (BNP): 3878 pg/mL — ABNORMAL HIGH (ref 0–450)

## 2013-12-03 LAB — POCT I-STAT TROPONIN I: TROPONIN I, POC: 0.14 ng/mL — AB (ref 0.00–0.08)

## 2013-12-03 MED ORDER — SODIUM CHLORIDE 0.9 % IJ SOLN
3.0000 mL | Freq: Two times a day (BID) | INTRAMUSCULAR | Status: DC
  Administered 2013-12-03 – 2013-12-07 (×7): 3 mL via INTRAVENOUS

## 2013-12-03 MED ORDER — ONDANSETRON HCL 4 MG/2ML IJ SOLN: 4.0000 mg | Freq: Four times a day (QID) | INTRAMUSCULAR | Status: DC | PRN

## 2013-12-03 MED ORDER — CYANOCOBALAMIN 1000 MCG PO TABS: 1000.0000 ug | ORAL_TABLET | Freq: Every day | ORAL | Status: DC

## 2013-12-03 MED ORDER — HYOSCYAMINE SULFATE 0.125 MG PO TABS
0.1250 mg | ORAL_TABLET | ORAL | Status: DC | PRN
  Filled 2013-12-03: qty 1

## 2013-12-03 MED ORDER — ACETAMINOPHEN 650 MG RE SUPP: 650.0000 mg | Freq: Four times a day (QID) | RECTAL | Status: DC | PRN

## 2013-12-03 MED ORDER — LEVOTHYROXINE SODIUM 88 MCG PO TABS
88.0000 ug | ORAL_TABLET | Freq: Every day | ORAL | Status: DC
  Administered 2013-12-04 – 2013-12-07 (×4): 88 ug via ORAL
  Filled 2013-12-03 (×5): qty 1

## 2013-12-03 MED ORDER — ASPIRIN 81 MG PO CHEW
81.0000 mg | CHEWABLE_TABLET | Freq: Every day | ORAL | Status: DC
  Filled 2013-12-03: qty 1

## 2013-12-03 MED ORDER — SODIUM CHLORIDE 0.9 % IV SOLN: 250.0000 mL | INTRAVENOUS | Status: DC | PRN

## 2013-12-03 MED ORDER — APIXABAN 2.5 MG PO TABS
2.5000 mg | ORAL_TABLET | Freq: Two times a day (BID) | ORAL | Status: DC
  Administered 2013-12-03 – 2013-12-07 (×8): 2.5 mg via ORAL
  Filled 2013-12-03 (×9): qty 1

## 2013-12-03 MED ORDER — FUROSEMIDE 10 MG/ML IJ SOLN
40.0000 mg | Freq: Two times a day (BID) | INTRAMUSCULAR | Status: DC
  Administered 2013-12-03: 40 mg via INTRAVENOUS
  Filled 2013-12-03 (×3): qty 4

## 2013-12-03 MED ORDER — IRBESARTAN 150 MG PO TABS
150.0000 mg | ORAL_TABLET | Freq: Every day | ORAL | Status: DC
  Administered 2013-12-04 – 2013-12-06 (×3): 150 mg via ORAL
  Filled 2013-12-03 (×4): qty 1

## 2013-12-03 MED ORDER — ASPIRIN 325 MG PO TABS
325.0000 mg | ORAL_TABLET | ORAL | Status: AC
  Administered 2013-12-03: 325 mg via ORAL
  Filled 2013-12-03: qty 1

## 2013-12-03 MED ORDER — MORPHINE SULFATE 2 MG/ML IJ SOLN: 2.0000 mg | INTRAMUSCULAR | Status: DC | PRN

## 2013-12-03 MED ORDER — ONDANSETRON HCL 4 MG PO TABS: 4.0000 mg | ORAL_TABLET | Freq: Four times a day (QID) | ORAL | Status: DC | PRN

## 2013-12-03 MED ORDER — TEMAZEPAM 15 MG PO CAPS
30.0000 mg | ORAL_CAPSULE | Freq: Every evening | ORAL | Status: DC | PRN
  Administered 2013-12-03 – 2013-12-06 (×4): 30 mg via ORAL
  Filled 2013-12-03 (×5): qty 2

## 2013-12-03 MED ORDER — PANTOPRAZOLE SODIUM 40 MG PO TBEC
40.0000 mg | DELAYED_RELEASE_TABLET | Freq: Every day | ORAL | Status: DC
  Administered 2013-12-03 – 2013-12-07 (×5): 40 mg via ORAL
  Filled 2013-12-03 (×5): qty 1

## 2013-12-03 MED ORDER — IRBESARTAN 150 MG PO TABS: 150.0000 mg | ORAL_TABLET | Freq: Every day | ORAL | Status: DC

## 2013-12-03 MED ORDER — SODIUM CHLORIDE 0.9 % IJ SOLN: 3.0000 mL | INTRAMUSCULAR | Status: DC | PRN

## 2013-12-03 MED ORDER — SODIUM CHLORIDE 0.9 % IJ SOLN
3.0000 mL | Freq: Two times a day (BID) | INTRAMUSCULAR | Status: DC
  Administered 2013-12-06 – 2013-12-07 (×2): 3 mL via INTRAVENOUS

## 2013-12-03 MED ORDER — TRAMADOL HCL 50 MG PO TABS: 50.0000 mg | ORAL_TABLET | Freq: Four times a day (QID) | ORAL | Status: DC | PRN

## 2013-12-03 MED ORDER — ACETAMINOPHEN 325 MG PO TABS: 650.0000 mg | ORAL_TABLET | Freq: Four times a day (QID) | ORAL | Status: DC | PRN

## 2013-12-03 MED ORDER — NITROGLYCERIN 0.1 MG/HR TD PT24
0.1000 mg | MEDICATED_PATCH | Freq: Every day | TRANSDERMAL | Status: DC
  Administered 2013-12-03 – 2013-12-06 (×4): 0.1 mg via TRANSDERMAL
  Filled 2013-12-03 (×5): qty 1

## 2013-12-03 MED ORDER — AMIODARONE HCL 200 MG PO TABS
200.0000 mg | ORAL_TABLET | Freq: Every day | ORAL | Status: DC
Start: 2013-12-03 — End: 2013-12-07
  Administered 2013-12-03 – 2013-12-07 (×5): 200 mg via ORAL
  Filled 2013-12-03 (×5): qty 1

## 2013-12-03 NOTE — H&P (Addendum)
[facility]                                                                                    Patient Demographics  [name], is a 80 y.o. female  CSN: [id number]  MRN: [id number]  DOB - 1924/01/05  Admit Date - 12/03/2013  Outpatient Primary MD for the patient is [name], MD   With History of -  Past Medical History  Diagnosis Date  . Hyperlipidemia   . Hypothyroidism   . Arthritis   . Anemia   . Asthma   . Anxiety   . IBS (irritable bowel syndrome)   . Gout   . Colon polyps   . Tubulovillous adenoma 4/07  . Hemorrhoids   . Diverticulosis   . Hypertension     Labile, difficult-to-control, at least stage 2  . Renal insufficiency, mild     Creatinine 1.5  . Chronic diarrhea       Past Surgical History  Procedure Laterality Date  . Lumbar disc surgery  2000  . Squamous cell skin cancer removed    . Oophorectomy  1962    right  . Appendectomy    . Low anterior bowel resection  7/08  . Cataract extraction      in for   Chief Complaint  Patient presents with  . Chest Pain  . Shortness of Breath     HPI  [name]  is a 80 y.o. female, with past medical history significant for atrial Fib , and hypertension presenting today with few hours history of chest heaviness associated with shortness of breath, no nausea vomiting fever or chills, no palpitations no cold sweats. For the last 1 week , patient has been having these episodes and feels they are basically incapacitating her. No history of leg edema although she thinks that she gained weight lately    Review of Systems    In addition to the HPI above,  No Fever-chills, Mild Headache, No changes with Vision or hearing, No problems swallowing food or Liquids, No Abdominal pain, No Nausea or Vommitting, Bowel movements are regular, No Blood in stool or Urine, No dysuria, No new skin rashes or bruises, No new joints pains-aches,  No new weakness, tingling, numbness in any  extremity, No recent weight gain or loss, No polyuria, polydypsia or polyphagia, No significant Mental Stressors.  A full 10 point Review of Systems was done, except as stated above, all other Review of Systems were negative.   Social History History  Substance Use Topics  . Smoking status: Never Smoker   . Smokeless tobacco: Never Used  . Alcohol Use: Yes     Comment: social wine intake     Family History Family History  Problem Relation Age of Onset  . Cancer Mother 41  . Heart attack Father   . Heart disease Brother   . Stroke Maternal Grandmother   . Cancer Maternal Grandfather     Lung cancer  . Stroke Paternal Grandmother   . Cancer Daughter      Prior to Admission medications   Medication Sig Start Date End Date Taking? Authorizing Provider  amiodarone (PACERONE)  200 MG tablet Take 200 mg by mouth daily.   Yes Historical Provider, MD  apixaban (ELIQUIS) 2.5 MG TABS tablet Take 1 tablet (2.5 mg total) by mouth 2 (two) times daily. 11/17/13  Yes Erlene Quan, PA-C  aspirin 81 MG tablet Take 81 mg by mouth daily.     Yes Historical Provider, MD  cyanocobalamin 1000 MCG tablet Take 1 tablet (1,000 mcg total) by mouth daily. Contact PCP for refills. 12/03/13  Yes [name], MD  hyoscyamine (LEVSIN, ANASPAZ) 0.125 MG tablet Take 1-2 tablets by mouth or under tongue every 4 hours as needed before meals 10/29/13  Yes Ladene Artist, MD  irbesartan (AVAPRO) 150 MG tablet Take 1 tablet (150 mg total) by mouth daily. 12/03/13  Yes [name], MD  levothyroxine (SYNTHROID, LEVOTHROID) 88 MCG tablet Take 88 mcg by mouth daily before breakfast.   Yes Historical Provider, MD  omeprazole (PRILOSEC) 40 MG capsule Take 1 capsule (40 mg total) by mouth daily. 08/12/13  Yes Ladene Artist, MD  temazepam (RESTORIL) 30 MG capsule Take 30 mg by mouth at bedtime as needed for sleep (restless leg).   Yes Historical Provider, MD  traMADol (ULTRAM) 50 MG tablet Take 1 tablet (50 mg  total) by mouth every 6 (six) hours as needed. 11/02/13   [name], MD    Allergies  Allergen Reactions  . Amoxicillin     REACTION: unspecified  . Codeine Phosphate     REACTION: unspecified  . Lipitor [Atorvastatin] Other (See Comments)    "makes legs jump all night"  . Penicillins     REACTION: nausea, swelling  . Xifaxan [Rifaximin] Other (See Comments)    unknown    Physical Exam  Vitals  Blood pressure 169/83, pulse 82, temperature 97.4 F (36.3 C), temperature source Oral, resp. rate 18, SpO2 100.00%.   1. General very pleasant elderly white American female in mild respiratory distress  2. Normal affect and insight, Not Suicidal or Homicidal, Awake Alert, Oriented X 3.  3. No F.N deficits, ALL C.Nerves Intact, Strength 5/5 all 4 extremities, Sensation intact all 4 extremities, Plantars down going.  4. Ears and Eyes appear Normal, Conjunctivae clear, PERRLA. Moist Oral Mucosa.  5. Supple Neck, No JVD, No cervical lymphadenopathy appriciated, No Carotid Bruits.  6. Symmetrical Chest wall movement, mild bibasilar crackles  7. RRR, No Gallops, Rubs or Murmurs, No Parasternal Heave.  8. Positive Bowel Sounds, Abdomen Soft, Non tender, No organomegaly appriciated,No rebound -guarding or rigidity.  9.  No Cyanosis, Normal Skin Turgor, No Skin Rash or Bruise.  10. Good muscle tone,  joints appear normal , Trace LE Edema  11. No Palpable Lymph Nodes in Neck or Axillae    Data Review  CBC  Recent Labs Lab 12/01/13 1149 12/03/13 1715  WBC 4.6 3.7*  HGB 10.9* 11.2*  HCT 33.8* 34.3*  PLT 226 203  MCV 89.7 91.7  MCH 28.9 29.9  MCHC 32.2 32.7  RDW 18.4* 18.2*   ------------------------------------------------------------------------------------------------------------------  Chemistries   Recent Labs Lab 12/03/13 1715  NA 141  K 4.8  CL 104  CO2 22  GLUCOSE 132*  BUN 37*  CREATININE 1.72*  CALCIUM 9.4     ---------------------------------------------------------------------------------------------------------------  Imaging results:   Dg Chest 2 View  12/03/2013   CLINICAL DATA:  Shortness of breath. Chest tightness. Atrial fibrillation and asthma.  EXAM: CHEST  2 VIEW  COMPARISON:  Acute abdominal series 11/01/2013.  FINDINGS: Cardiac enlargement is again  noted. Moderate pulmonary vascular and mild edema is new. New bilateral pleural effusions are evident, right greater than left. Bibasilar airspace disease is present.  IMPRESSION: 1. Interval development of congestive heart failure. 2. Bibasilar airspace disease likely reflects atelectasis. Infection is not excluded.   Electronically Signed   By: Lawrence Santiago M.D.   On: 12/03/2013 17:54    My personal review of EKG: Normal sinus rhythm with left bundle branch block   Assessment & Plan  1. chest pain episode with mildly elevated troponin 2. mild congestive heart failure / pulmonary edema 3. paroxysmal atrial fib on eliquis 4. hypertension uncontrolled 5. Chronic renal failure 6. Hypothyroidism  Plan  Admit to telemetry Cardiology already on consult Lasix IV Recent echo Noted Nitro paste Check TSH    DVT Prophylaxis eliquis  AM Labs Ordered, also please review Full Orders  Family Communication: Admission, patients condition and plan of care including tests being ordered have been discussed with the patient and son who indicate understanding and agree with the plan and Code Status.  Code Status full  Disposition Plan: home  Time spent in minutes : 35 min  Condition GUARDED

## 2013-12-03 NOTE — ED Notes (Signed)
Dr Aline Brochure aware of critical I stat Troponin

## 2013-12-03 NOTE — ED Notes (Signed)
Pt reports chest pain, sob. Hx a. Fib. Dx last month. Was started on amiodarone 2 weeks ago. Began having shob after taking it. Saw PA at cardiology this week and was reduced to half dose and continues to have shob. Was told she could not stop med abruptly but continues to be symptomatic with lowered dose. Pt noted to be in NSR today.

## 2013-12-03 NOTE — Telephone Encounter (Signed)
Pt. In the ER .Marland Kitchen Probably should stop amiodarone if it is causing her problems.  Dr. Debara Pickett

## 2013-12-03 NOTE — Consult Note (Signed)
Cardiology Consult Note  Admit date: 12/03/2013 Name: [name] 80 y.o.  female DOB:  1923/11/13 MRN:  [id number]  Today's date:  12/03/2013  Referring Physician:    [facility]  Primary Physician:    Dr. [name]  Reason for Consultation:    Elevated troponin, shortness of breath, chest pain  IMPRESSIONS: 1. Acute on chronic diastolic congestive heart failure 2. Chest discomfort with borderline elevation of troponin that may be ischemic 3. Paroxysmal atrial fibrillation currently in sinus rhythm 4. Tachycardia-bradycardia syndrome 5. Hypertensive heart disease with accelerated hypertension 6. Anxiety 7. Hyperlipidemia  RECOMMENDATION: She has had a thorough workup recently including echocardiogram, myocardial perfusion scanning and is currently in sinus rhythm. She has been intolerant recently metoprolol and at this point I would admit her for intravenous diuresis. She needs to have good blood pressure control and would see with a blood pressures after she has had a chance to be diuresed. Cycle troponins. I would continue Eliquus and would not anticoagulate with heparin. She'll really needs to have excellent blood pressure control to help her diastolic heart failure.  HISTORY: This 80 year old female has a history of paroxysmal atrial fibrillation. She was recently in the hospital a month ago when she had multiple somatic complaints and found to be in atrial fibrillation with a new left bundle branch block. She was placed on a beta blocker and had a myocardial perfusion scan that was felt to be low risk. Her echocardiogram showed preserved LV systolic function but with LVH and biatrial enlargement. She also had evidence of moderate pulmonary hypertension. She was somewhat anemic. She was discharged on Eliquus and beta blocker was in sinus rhythm on discharge. When she was seen back initially earlier in the month she was in atrial fibrillation and her metoprolol was  reduced and she was placed on amiodarone. She was seen 2 days ago her she was noted to be severely bradycardic and complaining of a heaviness or chest pressure and fullness in her chest. She had had progressive weight gain and her beta blocker was stopped. She called the office complaining of fatigue and just feeling poorly and complained of worsening shortness of breath. Today she could barely get up out of the bed and then this afternoon became significantly dyspneic and complained of chest pressure and was brought to the emergency room. She had a mildly elevated troponin and had a left bundle branch block noted. She had congestive heart failure and elevated BNP on her chest x-ray and cardiology was consulted.  Past Medical History  Diagnosis Date  . Hyperlipidemia   . Hypothyroidism   . Arthritis   . Anemia   . Asthma   . Anxiety   . IBS (irritable bowel syndrome)   . Gout   . Colon polyps   . Tubulovillous adenoma 4/07  . Hemorrhoids   . Diverticulosis   . Chronic diarrhea   . Hypertensive heart disease     no significant RAS by MRA 2002- (<30% LRAS)   . Chronic kidney disease stage III (GFR 30-59 ml/min)   . Hypertensive heart disease     no significant RAS by MRA 2002- (<30% LRAS)   . Paroxysmal atrial fibrillation 11/01/2013    Intermittent through the years and recurrent in December of 2014 treated with amiodarone   . Peripheral vascular disease     123456 LICA, [id number] RICA by doppler 2009   . Benign positional vertigo   . LBBB (left bundle branch block)- new  11/01/13 11/01/2013     Past Surgical History  Procedure Laterality Date  . Lumbar disc surgery  2000  . Squamous cell skin cancer removed    . Oophorectomy  1962    right  . Appendectomy    . Low anterior bowel resection  7/08  . Cataract extraction      Allergies:  is allergic to amoxicillin; codeine phosphate; lipitor; penicillins; and xifaxan.   Medications: Prior to Admission medications   Medication Sig  Start Date End Date Taking? Authorizing Provider  amiodarone (PACERONE) 200 MG tablet Take 200 mg by mouth daily.   Yes Historical Provider, MD  apixaban (ELIQUIS) 2.5 MG TABS tablet Take 1 tablet (2.5 mg total) by mouth 2 (two) times daily. 11/17/13  Yes Erlene Quan, PA-C  aspirin 81 MG tablet Take 81 mg by mouth daily.     Yes Historical Provider, MD  cyanocobalamin 1000 MCG tablet Take 1 tablet (1,000 mcg total) by mouth daily. Contact PCP for refills. 12/03/13  Yes [name], MD  hyoscyamine (LEVSIN, ANASPAZ) 0.125 MG tablet Take 1-2 tablets by mouth or under tongue every 4 hours as needed before meals 10/29/13  Yes Ladene Artist, MD  irbesartan (AVAPRO) 150 MG tablet Take 1 tablet (150 mg total) by mouth daily. 12/03/13  Yes [name], MD  levothyroxine (SYNTHROID, LEVOTHROID) 88 MCG tablet Take 88 mcg by mouth daily before breakfast.   Yes Historical Provider, MD  omeprazole (PRILOSEC) 40 MG capsule Take 1 capsule (40 mg total) by mouth daily. 08/12/13  Yes Ladene Artist, MD  temazepam (RESTORIL) 30 MG capsule Take 30 mg by mouth at bedtime as needed for sleep (restless leg).   Yes Historical Provider, MD  traMADol (ULTRAM) 50 MG tablet Take 1 tablet (50 mg total) by mouth every 6 (six) hours as needed. 11/02/13   [name], MD    Family History: Family Status  Relation Status Death Age  . Mother Deceased 15  . Father Deceased 67  . Brother Deceased 11  . Maternal Grandmother Deceased 76  . Maternal Grandfather Deceased 40  . Paternal Grandmother Deceased 34  . Paternal Grandfather Deceased 6  . Daughter Deceased 80  . Son Alive     Social History:   reports that she has never smoked. She has never used smokeless tobacco. She reports that she drinks alcohol. She reports that she does not use illicit drugs.   History   Social History Narrative  . No narrative on file    Review of Systems: She has had significant chronic anemia. She has had reduced appetite  and has had significant malaise and fatigue. His had multiple somatic complaints including chronic diarrhea, difficulty swallowing, significant low back pain  As noted above the remainder of the review of systems is unremarkable.  Physical Exam: BP 182/107  Pulse 90  Temp(Src) 97.4 F (36.3 C) (Oral)  Resp 16  SpO2 100%  General appearance: Frail, anxious appearing white female who doesn't appear in any acute distress, appears stated age Head: Normocephalic, without obvious abnormality, atraumatic Eyes: conjunctivae/corneas clear. PERRL, EOM's intact. Fundi not examined Neck: no adenopathy, bilat carotid bruit, no JVD and supple, symmetrical, trachea midline Lungs: Reduced breath sounds and mild rales at the bases Heart: Regular rhythm, normal S1-S2, S4 is present, no S3, no murmur appreciated Abdomen: soft, non-tender; bowel sounds normal; no masses,  no organomegaly Pelvic: deferred Extremities: 1+ peripheral edema noted Pulses: 2+ and symmetric Neurologic: Grossly  normal   Labs: CBC  Recent Labs  12/03/13 1715  WBC 3.7*  RBC 3.74*  HGB 11.2*  HCT 34.3*  PLT 203  MCV 91.7  MCH 29.9  MCHC 32.7  RDW 18.2*   CMP   Recent Labs  12/03/13 1715  NA 141  K 4.8  CL 104  CO2 22  GLUCOSE 132*  BUN 37*  CREATININE 1.72*  CALCIUM 9.4  GFRNONAA 26*  GFRAA 30*   BNP (last 3 results)  Recent Labs  12/03/13 1715  PROBNP 3878.0*   Cardiac Panel (last 3 results) Troponin (Point of Care Test)  Recent Labs  12/03/13 1723  TROPIPOC 0.14*     Radiology: Worsening interstitial congestion and congestive heart failure with basilar effusions  EKG: Normal sinus rhythm with a rate of 84, left bundle branch block  Signed:  W. Doristine Church MD [facility]   Cardiology Consultant  12/03/2013, 7:29 PM

## 2013-12-03 NOTE — Telephone Encounter (Signed)
Will call pt on Monday if not addressed at ED today.

## 2013-12-03 NOTE — Telephone Encounter (Signed)
Pt was calling in regards to leaving a message yesterday and not hearing back. She stated that she can not take anymore medication and wants to know how to be weaned off of it. She asked to be called on her cell phone number.

## 2013-12-03 NOTE — ED Provider Notes (Signed)
CSN: [id number]     Arrival date & time 12/03/13  1623 History   First MD Initiated Contact with Patient 12/03/13 1647     Chief Complaint  Patient presents with  . Chest Pain  . Shortness of Breath   (Consider location/radiation/quality/duration/timing/severity/associated sxs/prior Treatment) Patient is a 80 y.o. female presenting with chest pain and shortness of breath. The history is provided by the patient.  Chest Pain Pain location:  Substernal area Pain quality: pressure and tightness   Pain radiates to:  Does not radiate Pain radiates to the back: no   Pain severity:  Mild Onset quality:  Gradual Duration: several days. Timing:  Intermittent Progression:  Waxing and waning Chronicity:  New Context: at rest   Relieved by:  Nothing Worsened by:  Nothing tried Ineffective treatments:  None tried Associated symptoms: cough (mild) and shortness of breath   Associated symptoms: no abdominal pain, no back pain, no dizziness, no fatigue, no fever, no headache, no nausea and not vomiting   Shortness of breath:    Severity:  Mild   Onset quality:  Gradual   Duration:  1 month   Timing:  Intermittent   Progression:  Worsening Shortness of Breath Associated symptoms: chest pain and cough (mild)   Associated symptoms: no abdominal pain, no fever, no headaches, no neck pain and no vomiting     Past Medical History  Diagnosis Date  . Hyperlipidemia   . Hypothyroidism   . Arthritis   . Anemia   . Asthma   . Anxiety   . IBS (irritable bowel syndrome)   . Gout   . Colon polyps   . Tubulovillous adenoma 4/07  . Hemorrhoids   . Diverticulosis   . Hypertension     Labile, difficult-to-control, at least stage 2  . Renal insufficiency, mild     Creatinine 1.5  . Chronic diarrhea    Past Surgical History  Procedure Laterality Date  . Lumbar disc surgery  2000  . Squamous cell skin cancer removed    . Oophorectomy  1962    right  . Appendectomy    . Low anterior bowel  resection  7/08  . Cataract extraction     Family History  Problem Relation Age of Onset  . Cancer Mother 59  . Heart attack Father   . Heart disease Brother   . Stroke Maternal Grandmother   . Cancer Maternal Grandfather     Lung cancer  . Stroke Paternal Grandmother   . Cancer Daughter    History  Substance Use Topics  . Smoking status: Never Smoker   . Smokeless tobacco: Never Used  . Alcohol Use: Yes     Comment: social wine intake   OB History   Grav Para Term Preterm Abortions TAB SAB Ect Mult Living                 Review of Systems  Constitutional: Negative for fever and fatigue.  HENT: Negative for congestion and drooling.   Eyes: Negative for pain.  Respiratory: Positive for cough (mild) and shortness of breath.   Cardiovascular: Positive for chest pain.  Gastrointestinal: Negative for nausea, vomiting, abdominal pain and diarrhea.  Genitourinary: Negative for dysuria and hematuria.  Musculoskeletal: Negative for back pain, gait problem and neck pain.  Skin: Negative for color change.  Neurological: Negative for dizziness and headaches.  Hematological: Negative for adenopathy.  Psychiatric/Behavioral: Negative for behavioral problems.  All other systems reviewed and are negative.  Allergies  Amoxicillin; Codeine phosphate; Lipitor; Penicillins; and Xifaxan  Home Medications   Current Outpatient Rx  Name  Route  Sig  Dispense  Refill  . apixaban (ELIQUIS) 2.5 MG TABS tablet   Oral   Take 1 tablet (2.5 mg total) by mouth 2 (two) times daily.   60 tablet   6   . aspirin 81 MG tablet   Oral   Take 81 mg by mouth daily.           . cyanocobalamin 1000 MCG tablet   Oral   Take 1 tablet (1,000 mcg total) by mouth daily. Contact PCP for refills.   30 tablet   0   . hyoscyamine (LEVSIN, ANASPAZ) 0.125 MG tablet      Take 1-2 tablets by mouth or under tongue every 4 hours as needed before meals   360 tablet   5   . irbesartan (AVAPRO) 150 MG  tablet   Oral   Take 1 tablet (150 mg total) by mouth daily.   30 tablet   3   . levothyroxine (SYNTHROID, LEVOTHROID) 88 MCG tablet      TAKE 1 TABLET BY MOUTH EVERY DAY BEFORE BREAKFAST   30 tablet   11   . omeprazole (PRILOSEC) 40 MG capsule   Oral   Take 1 capsule (40 mg total) by mouth daily.   30 capsule   11   . temazepam (RESTORIL) 30 MG capsule      TAKE 1 CAPSULE AT BEDTIME AS NEEDED   30 capsule   5   . traMADol (ULTRAM) 50 MG tablet   Oral   Take 1 tablet (50 mg total) by mouth every 6 (six) hours as needed.   60 tablet   2     May take 1-2 tablets prn    BP 193/95  Pulse 93  Temp(Src) 97.4 F (36.3 C) (Oral)  Resp 15  SpO2 98% Physical Exam  Nursing note and vitals reviewed. Constitutional: She is oriented to person, place, and time. She appears well-developed and well-nourished.  HENT:  Head: Normocephalic.  Mouth/Throat: Oropharynx is clear and moist. No oropharyngeal exudate.  Anisocoria w/ drooping pupil on the right associated w/ a previous surgery.   Eyes: Conjunctivae and EOM are normal. Pupils are equal, round, and reactive to light.  Neck: Normal range of motion. Neck supple.  Cardiovascular: Normal rate, regular rhythm, normal heart sounds and intact distal pulses.  Exam reveals no gallop and no friction rub.   No murmur heard. Pulmonary/Chest: Effort normal and breath sounds normal. No respiratory distress. She has no wheezes.  Abdominal: Soft. Bowel sounds are normal. There is no tenderness. There is no rebound and no guarding.  Musculoskeletal: Normal range of motion. She exhibits no edema and no tenderness.  Neurological: She is alert and oriented to person, place, and time.  Skin: Skin is warm and dry.  Psychiatric: She has a normal mood and affect. Her behavior is normal.    ED Course  Procedures (including critical care time) Labs Review Labs Reviewed  CBC - Abnormal; Notable for the following:    WBC 3.7 (*)    RBC 3.74  (*)    Hemoglobin 11.2 (*)    HCT 34.3 (*)    RDW 18.2 (*)    All other components within normal limits  BASIC METABOLIC PANEL - Abnormal; Notable for the following:    Glucose, Bld 132 (*)    BUN 37 (*)  Creatinine, Ser 1.72 (*)    GFR calc non Af Amer 26 (*)    GFR calc Af Amer 30 (*)    All other components within normal limits  PRO B NATRIURETIC PEPTIDE - Abnormal; Notable for the following:    Pro B Natriuretic peptide (BNP) 3878.0 (*)    All other components within normal limits  URINALYSIS W MICROSCOPIC + REFLEX CULTURE - Abnormal; Notable for the following:    Protein, ur 30 (*)    All other components within normal limits  POCT I-STAT TROPONIN I - Abnormal; Notable for the following:    Troponin i, poc 0.14 (*)    All other components within normal limits  TSH   Imaging Review Dg Chest 2 View  12/03/2013   CLINICAL DATA:  Shortness of breath. Chest tightness. Atrial fibrillation and asthma.  EXAM: CHEST  2 VIEW  COMPARISON:  Acute abdominal series 11/01/2013.  FINDINGS: Cardiac enlargement is again noted. Moderate pulmonary vascular and mild edema is new. New bilateral pleural effusions are evident, right greater than left. Bibasilar airspace disease is present.  IMPRESSION: 1. Interval development of congestive heart failure. 2. Bibasilar airspace disease likely reflects atelectasis. Infection is not excluded.   Electronically Signed   By: Lawrence Santiago M.D.   On: 12/03/2013 17:54    EKG Interpretation    Date/Time:  Friday December 03 2013 16:30:06 EST Ventricular Rate:  84 PR Interval:  189 QRS Duration: 129 QT Interval:  392 QTC Calculation: 463 R Axis:   15 Text Interpretation:  Sinus rhythm Left bundle branch block No significant change since last tracing Confirmed by Lilyth Lawyer  MD, Ambyr Qadri ([id number]) on 12/03/2013 4:49:43 PM           CRITICAL CARE Performed by: Pamella Pert, S Total critical care time: 30 min Critical care time was exclusive of  separately billable procedures and treating other patients. Critical care was necessary to treat or prevent imminent or life-threatening deterioration. Critical care was time spent personally by me on the following activities: development of treatment plan with patient and/or surrogate as well as nursing, discussions with consultants, evaluation of patient's response to treatment, examination of patient, obtaining history from patient or surrogate, ordering and performing treatments and interventions, ordering and review of laboratory studies, ordering and review of radiographic studies, pulse oximetry and re-evaluation of patient's condition.   MDM   1. CHF (congestive heart failure)   2. Elevated troponin   3. Atypical chest pain   4. Chronic renal insufficiency, stage III (moderate)   5. Fatigue   6. GERD (gastroesophageal reflux disease)    5:10 PM 80 y.o. female who presents with intermittent chest pain and shortness of breath. The patient was admitted in December of 2014 for atypical chest pain. She was found to have atrial fibrillation and started on eliquis. She states that she was started on metoprolol as well. She began taking amiodarone in addition to metoprolol approximately 2 weeks ago. She notes a worsening of shortness of breath since starting the amiodarone. 2 days ago she was seen by cardiology and her metoprolol was discontinued and the amiodarone dose was decreased to 200 mg. She notes worsening chest pressure lasting hours at a time for the last few days and worsening shortness of breath since yesterday. She is afebrile and her initial blood pressure is elevated here. She's not appear to have increased worker breathing on exam and has a normal oxygen saturation on room air. Well's negative, I suspect her  sob is assoc w/ her new medications. Will get screening labwork and imaging.  Discussed case w/ on call cards who does not recommend heparinizing at this time.   Critical care  documented in this pt w/ new development of elevated troponin which is possibly ischemic in nature.     Blanchard Kelch, MD 12/04/13 1130

## 2013-12-03 NOTE — ED Notes (Signed)
Bed: WA19 Expected date:  Expected time:  Means of arrival:  Comments: Triage 2 

## 2013-12-03 NOTE — ED Notes (Signed)
MD at bedside. 

## 2013-12-03 NOTE — Telephone Encounter (Signed)
Returned call and pt verified x 2.  Pt informed results not reviewed by provider yet.  Pt wanted to know hemoglobin and informed it is 10.9.  Pt informed it is still low, but not as low as last check.  Pt would like results sent to Dr. Antonieta Pert office and informed Medical Records will be notified.  Pt will call today to schedule a f/u appt.  Pt also needs refills on irbesartan and vit B-12.  Pt informed refill will be sent for both and that she will need to get further refills on vit b-12 from PCP or Dr. Marin Olp.  Pt verbalized understanding and agreed w/ plan.   Pt also c/o having SOB about 30-45 mins after taking amiodarone.  Stated she took it separately from her other meds b/c she thought it was the cause of her SOB.  Pt stated she cannot take any more of that med.  Pt wants to know how to wean off of it b/c her pharmacist told her she shouldn't just stop it.  Pt informed provider will be notified for further instructions.  Pt verbalized understanding and agreed w/ plan.  Message forwarded to Dr. Debara Pickett.

## 2013-12-03 NOTE — Telephone Encounter (Signed)
RN spoke w/ pt shis morning and message has been sent to Dr. Debara Pickett to advise.  Awaiting response.  Will call pt once a response is given.

## 2013-12-04 DIAGNOSIS — I119 Hypertensive heart disease without heart failure: Secondary | ICD-10-CM

## 2013-12-04 DIAGNOSIS — F411 Generalized anxiety disorder: Secondary | ICD-10-CM

## 2013-12-04 DIAGNOSIS — I4891 Unspecified atrial fibrillation: Secondary | ICD-10-CM

## 2013-12-04 DIAGNOSIS — I5033 Acute on chronic diastolic (congestive) heart failure: Principal | ICD-10-CM

## 2013-12-04 DIAGNOSIS — R799 Abnormal finding of blood chemistry, unspecified: Secondary | ICD-10-CM

## 2013-12-04 LAB — TROPONIN I
TROPONIN I: 1.13 ng/mL — AB (ref <0.30)
Troponin I: 1.59 ng/mL (ref <0.30)
Troponin I: 0.93 ng/mL (ref <0.30)

## 2013-12-04 LAB — TSH: TSH: 1.455 u[IU]/mL (ref 0.350–4.500)

## 2013-12-04 MED ORDER — AMLODIPINE BESYLATE 5 MG PO TABS
5.0000 mg | ORAL_TABLET | Freq: Every day | ORAL | Status: DC
Start: 2013-12-04 — End: 2013-12-07
  Administered 2013-12-04 – 2013-12-07 (×4): 5 mg via ORAL
  Filled 2013-12-04 (×4): qty 1

## 2013-12-04 MED ORDER — FUROSEMIDE 10 MG/ML IJ SOLN
40.0000 mg | Freq: Every day | INTRAMUSCULAR | Status: DC
  Administered 2013-12-04 – 2013-12-05 (×2): 40 mg via INTRAVENOUS
  Filled 2013-12-04: qty 4

## 2013-12-04 NOTE — Progress Notes (Signed)
SUBJECTIVE:  Less SOB this am.  Up all night urinating  OBJECTIVE:   Vitals:   Filed Vitals:   12/03/13 1915 12/03/13 1940 12/03/13 2056 12/04/13 0340  BP: 182/107 180/91 180/92 157/78  Pulse: 90 86 93 77  Temp:  97.4 F (36.3 C) 98 F (36.7 C) 97.8 F (36.6 C)  TempSrc:  Oral Oral Oral  Resp: 16 20  16   Height:   5\' 5"  (1.651 m)   Weight:   155 lb 3.3 oz (70.4 kg) 154 lb 8.7 oz (70.1 kg)  SpO2: 100% 100% 100% 96%   I&O's:   Intake/Output Summary (Last 24 hours) at 12/04/13 0659 Last data filed at 12/04/13 0522  Gross per 24 hour  Intake      3 ml  Output   2400 ml  Net  -2397 ml   TELEMETRY: Reviewed telemetry pt in NSR:     PHYSICAL EXAM General: Well developed, well nourished, in no acute distress Head: Eyes PERRLA, No xanthomas.   Normal cephalic and atramatic  Lungs:   Crackles at bases bilaterally Heart:   HRRR S1 S2 Pulses are 2+ & equal. Abdomen: Bowel sounds are positive, abdomen soft and non-tender without masses  Extremities:   No clubbing, cyanosis or edema.  DP +1 Neuro: Alert and oriented X 3. Psych:  Good affect, responds appropriately   LABS: Basic Metabolic Panel:  Recent Labs  12/03/13 1715  NA 141  K 4.8  CL 104  CO2 22  GLUCOSE 132*  BUN 37*  CREATININE 1.72*  CALCIUM 9.4   Liver Function Tests: No results found for this basename: AST, ALT, ALKPHOS, BILITOT, PROT, ALBUMIN,  in the last 72 hours No results found for this basename: LIPASE, AMYLASE,  in the last 72 hours CBC:  Recent Labs  12/01/13 1149 12/03/13 1715  WBC 4.6 3.7*  HGB 10.9* 11.2*  HCT 33.8* 34.3*  MCV 89.7 91.7  PLT 226 203   Cardiac Enzymes: No results found for this basename: CKTOTAL, CKMB, CKMBINDEX, TROPONINI,  in the last 72 hours BNP: No components found with this basename: POCBNP,  D-Dimer: No results found for this basename: DDIMER,  in the last 72 hours Hemoglobin A1C: No results found for this basename: HGBA1C,  in the last 72 hours Fasting  Lipid Panel: No results found for this basename: CHOL, HDL, LDLCALC, TRIG, CHOLHDL, LDLDIRECT,  in the last 72 hours Thyroid Function Tests:  Recent Labs  12/03/13 1745  TSH 1.455   Anemia Panel: No results found for this basename: VITAMINB12, FOLATE, FERRITIN, TIBC, IRON, RETICCTPCT,  in the last 72 hours Coag Panel:   Lab Results  Component Value Date   INR 1.0 05/12/2007    RADIOLOGY: Dg Chest 2 View  12/03/2013   CLINICAL DATA:  Shortness of breath. Chest tightness. Atrial fibrillation and asthma.  EXAM: CHEST  2 VIEW  COMPARISON:  Acute abdominal series 11/01/2013.  FINDINGS: Cardiac enlargement is again noted. Moderate pulmonary vascular and mild edema is new. New bilateral pleural effusions are evident, right greater than left. Bibasilar airspace disease is present.  IMPRESSION: 1. Interval development of congestive heart failure. 2. Bibasilar airspace disease likely reflects atelectasis. Infection is not excluded.   Electronically Signed   By: Lawrence Santiago M.D.   On: 12/03/2013 17:54    IMPRESSIONS:  1. Acute on chronic diastolic congestive heart failure - she has put out 2.3L since yesterday.  Weight is down 5 pounds. 2. Chest discomfort with borderline elevation of  troponin that may be ischemic  3. Paroxysmal atrial fibrillation currently in sinus rhythm  4. Tachycardia-bradycardia syndrome  5. Hypertensive heart disease with accelerated hypertension - needs aggressive treatment of HTN - BP still elevated 6. Anxiety  7. Hyperlipidemia  8. Anemia - needs to have workup for this since on anticoagulation if this has not been done recently 9.  Acute renal insufficiency ? Secondary to diuretics  RECOMMENDATION:  -  She has had a thorough workup recently including echocardiogram, myocardial perfusion scanning and is currently in sinus rhythm. She has been intolerant recently metoprolol due to bradycarda.  -  Cycle troponins.  -  Continue Eliqius/amiodarone  -  She really needs  to have excellent blood pressure control to help her diastolic heart failure.  Add Amlodpine 5mg  daily.  Given bump in creatinine will not increase ARB any further. - stop ASA since she is on Eliquis - decrease Lasix to 40mg  IV daily due to bump in creatinine    Sueanne Margarita, MD  12/04/2013  6:59 AM

## 2013-12-04 NOTE — Progress Notes (Signed)
CRITICAL VALUE ALERT  Critical value received:  Troponin 1.59  Date of notification:  12/04/13  Time of notification:  0820  Critical value read back:yes  Nurse who received alert:  Clovia Cuff RN  MD notified (1st page):  Fransico Him, cardiology - results given to and read back by receptionist. To be paged to Dr. Radford Pax.  Will await return call if needed.  Time of first page:  0822  MD notified (2nd page):  Time of second page:  Responding MD:  None  Time MD responded:  N/A

## 2013-12-04 NOTE — Progress Notes (Signed)
[facility] PROGRESS NOTE  DELIZA BILLINGS [id number] DOB: Aug 26, 1924 DOA: 12/03/2013 PCP: [name], MD  Assessment/Plan: 1. chest pain episode with mildly elevated troponin  -Patient chest pain-free this a.m. -Continue ARB, Eliquis(aspirin DC'd). Her cards she has been intolerant of beta blocker. -That she had for workup including echo myocardial perfusion scan recently 2. mild congestive heart failure / pulmonary edema -Clinically improving with diuresis-3 L out overnight -Per cards, agree with decrease in Lasix due to bump in creatinine -Continue ARB and amiodarone  3. paroxysmal atrial fib on eliquis -Rate controlled, continue amiodarone and eliquis  4. hypertension uncontrolled -Continue ARB  5. Chronic renal failure  -Lasix decreased d/t in creatinine as above, continue to monitor closely with diuresis 6. Hypothyroidism -TSH within normal limits -Continue Synthroid   Code Status: full Family Communication: Son at bedside Disposition Plan: To home when medically ready   Consultants:  cards  Procedures:  none  Antibiotics:  none   HPI/Subjective: States breathing much better today, denies chest pain.  Objective: Filed Vitals:   12/04/13 1404  BP: 122/54  Pulse: 72  Temp: 98 F (36.7 C)  Resp: 18    Intake/Output Summary (Last 24 hours) at 12/04/13 1521 Last data filed at 12/04/13 1200  Gross per 24 hour  Intake    223 ml  Output   3650 ml  Net  -3427 ml   Filed Weights   12/03/13 2056 12/04/13 0340  Weight: 70.4 kg (155 lb 3.3 oz) 70.1 kg (154 lb 8.7 oz)    Exam:  General: alert & oriented x 3 In NAD Cardiovascular: RRR, nl S1 s2 Respiratory: Crackles at bases greater in left base Abdomen: soft +BS NT/ND, no masses palpable Extremities: No cyanosis and trace edema    Data Reviewed: Basic Metabolic Panel:  Recent Labs Lab 12/03/13 1715  NA 141  K 4.8  CL 104  CO2 22  GLUCOSE 132*  BUN 37*  CREATININE 1.72*   CALCIUM 9.4   Liver Function Tests: No results found for this basename: AST, ALT, ALKPHOS, BILITOT, PROT, ALBUMIN,  in the last 168 hours No results found for this basename: LIPASE, AMYLASE,  in the last 168 hours No results found for this basename: AMMONIA,  in the last 168 hours CBC:  Recent Labs Lab 12/01/13 1149 12/03/13 1715  WBC 4.6 3.7*  HGB 10.9* 11.2*  HCT 33.8* 34.3*  MCV 89.7 91.7  PLT 226 203   Cardiac Enzymes:  Recent Labs Lab 12/04/13 0738 12/04/13 1153  TROPONINI 1.59* 1.13*   BNP (last 3 results)  Recent Labs  12/03/13 1715  PROBNP 3878.0*   CBG: No results found for this basename: GLUCAP,  in the last 168 hours  No results found for this or any previous visit (from the past 240 hour(s)).   Studies: Dg Chest 2 View  12/03/2013   CLINICAL DATA:  Shortness of breath. Chest tightness. Atrial fibrillation and asthma.  EXAM: CHEST  2 VIEW  COMPARISON:  Acute abdominal series 11/01/2013.  FINDINGS: Cardiac enlargement is again noted. Moderate pulmonary vascular and mild edema is new. New bilateral pleural effusions are evident, right greater than left. Bibasilar airspace disease is present.  IMPRESSION: 1. Interval development of congestive heart failure. 2. Bibasilar airspace disease likely reflects atelectasis. Infection is not excluded.   Electronically Signed   By: Lawrence Santiago M.D.   On: 12/03/2013 17:54    Scheduled Meds: . amiodarone  200 mg Oral Daily  . amLODipine  5  mg Oral Daily  . apixaban  2.5 mg Oral BID  . furosemide  40 mg Intravenous Daily  . irbesartan  150 mg Oral Daily  . levothyroxine  88 mcg Oral QAC breakfast  . nitroGLYCERIN  0.1 mg Transdermal Daily  . pantoprazole  40 mg Oral Daily  . sodium chloride  3 mL Intravenous Q12H  . sodium chloride  3 mL Intravenous Q12H   Continuous Infusions:   Principal Problem:   Acute on chronic diastolic congestive heart failure Active Problems:   Hypertensive heart disease   Chronic  kidney disease stage III (GFR 30-59 ml/min)   Paroxysmal atrial fibrillation   Chronic anticoagulation- Eliquis   Chest pain   H/O tachycardia-bradycardia syndrome    Time spent: [facility] Pager [phone]. If 7PM-7AM, please contact night-coverage at [email or web address], password [facility] 12/04/2013, 3:21 PM  LOS: 1 day

## 2013-12-05 LAB — BASIC METABOLIC PANEL
BUN: 33 mg/dL — ABNORMAL HIGH (ref 6–23)
CO2: 26 mEq/L (ref 19–32)
Calcium: 9.2 mg/dL (ref 8.4–10.5)
Chloride: 100 mEq/L (ref 96–112)
Creatinine, Ser: 1.94 mg/dL — ABNORMAL HIGH (ref 0.50–1.10)
GFR calc Af Amer: 26 mL/min — ABNORMAL LOW (ref >90)
GFR calc non Af Amer: 22 mL/min — ABNORMAL LOW (ref >90)
Glucose, Bld: 178 mg/dL — ABNORMAL HIGH (ref 70–99)
Potassium: 4.3 mEq/L (ref 3.7–5.3)
Sodium: 143 mEq/L (ref 137–147)

## 2013-12-05 MED ORDER — SODIUM CHLORIDE 0.45 % IV SOLN
INTRAVENOUS | Status: AC
  Administered 2013-12-05: 12:00:00 via INTRAVENOUS

## 2013-12-05 NOTE — Progress Notes (Signed)
[facility] PROGRESS NOTE  [name] [id number] DOB: [birth year] DOA: 12/03/2013 PCP: [name], MD  Assessment/Plan: 1. chest pain episode with mildly elevated troponin  -Patient chest pain-free this a.m. -Continue ARB, Eliquis(aspirin DC'd). per cards she has been intolerant of beta blocker. -she had for workup including echo myocardial perfusion scan recently -continuing medical managenment, and cards recommending d/c in am 2. mild congestive heart failure / pulmonary edema -Cr continuing to trend up, she already received iv lasix this am, will change to po in am  -follow and recheck -Continue ARB and amiodarone  3. paroxysmal atrial fib on eliquis -Rate controlled, continue amiodarone and eliquis  4. hypertension uncontrolled -Continue ARB  5. Chronic renal failure  - as above change to oral lasix in am, continue to monitor closely with diuresis 6. Hypothyroidism -TSH within normal limits -Continue Synthroid   Code Status: full Family Communication: none bedside Disposition Plan: To home when medically ready   Consultants:  cards  Procedures:  none  Antibiotics:  none   HPI/Subjective: Doing well today, denies any c/o  Objective: Filed Vitals:   12/05/13 1314  BP: 125/62  Pulse: 64  Temp: 97.7 F (36.5 C)  Resp: 18    Intake/Output Summary (Last 24 hours) at 12/05/13 1746 Last data filed at 12/05/13 1559  Gross per 24 hour  Intake    730 ml  Output   1175 ml  Net   -445 ml   Filed Weights   12/03/13 2056 12/04/13 0340 12/05/13 0449  Weight: 70.4 kg (155 lb 3.3 oz) 70.1 kg (154 lb 8.7 oz) 65.9 kg (145 lb 4.5 oz)    Exam:  General: alert & oriented x 3 In NAD Cardiovascular: RRR, nl S1 s2 Respiratory: decreased Crackles, no wheezes Abdomen: soft +BS NT/ND, no masses palpable Extremities: No cyanosis and trace edema    Data Reviewed: Basic Metabolic Panel:  Recent Labs Lab 12/03/13 1715 12/05/13 0938  NA 141 143   K 4.8 4.3  CL 104 100  CO2 22 26  GLUCOSE 132* 178*  BUN 37* 33*  CREATININE 1.72* 1.94*  CALCIUM 9.4 9.2   Liver Function Tests: No results found for this basename: AST, ALT, ALKPHOS, BILITOT, PROT, ALBUMIN,  in the last 168 hours No results found for this basename: LIPASE, AMYLASE,  in the last 168 hours No results found for this basename: AMMONIA,  in the last 168 hours CBC:  Recent Labs Lab 12/01/13 1149 12/03/13 1715  WBC 4.6 3.7*  HGB 10.9* 11.2*  HCT 33.8* 34.3*  MCV 89.7 91.7  PLT 226 203   Cardiac Enzymes:  Recent Labs Lab 12/04/13 0738 12/04/13 1153 12/04/13 1755  TROPONINI 1.59* 1.13* 0.93*   BNP (last 3 results)  Recent Labs  12/03/13 1715  PROBNP 3878.0*   CBG: No results found for this basename: GLUCAP,  in the last 168 hours  No results found for this or any previous visit (from the past 240 hour(s)).   Studies: No results found.  Scheduled Meds: . amiodarone  200 mg Oral Daily  . amLODipine  5 mg Oral Daily  . apixaban  2.5 mg Oral BID  . irbesartan  150 mg Oral Daily  . levothyroxine  88 mcg Oral QAC breakfast  . nitroGLYCERIN  0.1 mg Transdermal Daily  . pantoprazole  40 mg Oral Daily  . sodium chloride  3 mL Intravenous Q12H  . sodium chloride  3 mL Intravenous Q12H   Continuous Infusions:   Principal  Problem:   Acute on chronic diastolic congestive heart failure Active Problems:   Hypertensive heart disease   Chronic kidney disease stage III (GFR 30-59 ml/min)   Paroxysmal atrial fibrillation   Chronic anticoagulation- Eliquis   Chest pain   H/O tachycardia-bradycardia syndrome    Time spent: [facility] Pager [phone]. If 7PM-7AM, please contact night-coverage at [email or web address], password [facility] 12/05/2013, 5:46 PM  LOS: 2 days

## 2013-12-05 NOTE — Progress Notes (Addendum)
SUBJECTIVE:  The patient states that she slept very well and overall feels much better.    OBJECTIVE:   Vitals:   Filed Vitals:   12/04/13 1205 12/04/13 1404 12/04/13 2018 12/05/13 0449  BP: 139/69 122/54 129/64 107/51  Pulse: 69 72 75 66  Temp:  98 F (36.7 C) 97.9 F (36.6 C) 97.6 F (36.4 C)  TempSrc:  Oral Oral Oral  Resp: 16 18 18 18   Height:      Weight:    145 lb 4.5 oz (65.9 kg)  SpO2: 96% 93% 91% 94%   I&O's:    Intake/Output Summary (Last 24 hours) at 12/05/13 0801 Last data filed at 12/04/13 2231  Gross per 24 hour  Intake    220 ml  Output   1925 ml  Net  -1705 ml   TELEMETRY: Reviewed telemetry pt in NSR:  . amiodarone  200 mg Oral Daily  . amLODipine  5 mg Oral Daily  . apixaban  2.5 mg Oral BID  . furosemide  40 mg Intravenous Daily  . irbesartan  150 mg Oral Daily  . levothyroxine  88 mcg Oral QAC breakfast  . nitroGLYCERIN  0.1 mg Transdermal Daily  . pantoprazole  40 mg Oral Daily  . sodium chloride  3 mL Intravenous Q12H  . sodium chloride  3 mL Intravenous Q12H    PHYSICAL EXAM General: Well developed, well nourished, in no acute distress Head: Eyes PERRLA, No xanthomas.   Normal cephalic and atramatic  Lungs:   Crackles at bases bilaterally Heart:   HRRR S1 S2 Pulses are 2+ & equal. Abdomen: Bowel sounds are positive, abdomen soft and non-tender without masses  Extremities:   No clubbing, cyanosis or edema.  DP +1 Neuro: Alert and oriented X 3. Psych:  Good affect, responds appropriately   LABS: Basic Metabolic Panel:  Recent Labs  12/03/13 1715  NA 141  K 4.8  CL 104  CO2 22  GLUCOSE 132*  BUN 37*  CREATININE 1.72*  CALCIUM 9.4    Recent Labs  12/03/13 1715  WBC 3.7*  HGB 11.2*  HCT 34.3*  MCV 91.7  PLT 203   Cardiac Enzymes:  Recent Labs  12/04/13 0738 12/04/13 1153 12/04/13 1755  TROPONINI 1.59* 1.13* 0.93*   Recent Labs  12/03/13 1745  TSH 1.455    RADIOLOGY: Dg Chest 2 View  12/03/2013    CLINICAL DATA:  Shortness of breath. Chest tightness. Atrial fibrillation and asthma.  EXAM: CHEST  2 VIEW  COMPARISON:  Acute abdominal series 11/01/2013.  FINDINGS: Cardiac enlargement is again noted. Moderate pulmonary vascular and mild edema is new. New bilateral pleural effusions are evident, right greater than left. Bibasilar airspace disease is present.  IMPRESSION: 1. Interval development of congestive heart failure. 2. Bibasilar airspace disease likely reflects atelectasis. Infection is not excluded.   Electronically Signed   By: Lawrence Santiago M.D.   On: 12/03/2013 17:54   Echo: 12/04/2013 Study Conclusions  - Left ventricle: The cavity size was normal. Wall thickness was increased in a pattern of mild LVH. There was mild concentric hypertrophy. Systolic function was normal. The estimated ejection fraction was in the range of 55% to 60%. Although no diagnostic regional wall motion abnormality was identified, this possibility cannot be completely excluded on the basis of this study. Doppler parameters are consistent with elevated mean left atrial filling pressure. - Ventricular septum: Septal motion showed abnormal function, dyssynergy, and "bounce". These changes are consistent with intraventricular conduction  delay. - Aortic valve: Mildly calcified annulus. Probably trileaflet; normal thickness leaflets. Mild calcification. Trivial regurgitation. - Mitral valve: Mild regurgitation. - Left atrium: The atrium was severely dilated. - Right ventricle: The cavity size was mildly dilated. Wall thickness was normal. - Right atrium: The atrium was severely dilated. - Pulmonary arteries: Systolic pressure was moderately increased. PA peak pressure: 21mm Hg (S). Impressions:  - An atrial fibrillation rhythm was noted on this study, making this study technically difficult for the assessment of cardiac function.     IMPRESSIONS:  1. Acute on chronic diastolic congestive heart  failure - she has put out 4 L in the last 24 hours.  Weight is down 5 pounds. 2. Chest discomfort with borderline elevation of troponin that may be ischemic  3. Paroxysmal atrial fibrillation currently in sinus rhythm  4. Tachycardia-bradycardia syndrome  5. Hypertensive heart disease with accelerated hypertension - needs aggressive treatment of HTN - BP still elevated 6. Anxiety  7. Hyperlipidemia  8. Anemia - needs to have workup for this since on anticoagulation if this has not been done recently 9.  Acute renal insufficiency ? Secondary to diuretics  RECOMMENDATION:  -  She has had a thorough workup recently including echocardiogram, myocardial perfusion scanning and is currently in sinus rhythm. She has been intolerant recently metoprolol due to bradycardia.  -  Cycle troponins.  -  Continue Eliqius/amiodarone  -  She really needs to have excellent blood pressure control to help her diastolic heart failure.  Add Amlodpine 5mg  daily.  Given bump in creatinine will not increase ARB any further. - stop ASA since she is on Eliquis - decrease Lasix to 40mg  IV daily due to bump in creatinine - repeat BMP  Anticipated discharge is tomorrow.    [name], Lemmie Evens, MD  12/05/2013  8:01 AM

## 2013-12-06 DIAGNOSIS — D649 Anemia, unspecified: Secondary | ICD-10-CM

## 2013-12-06 DIAGNOSIS — Z8679 Personal history of other diseases of the circulatory system: Secondary | ICD-10-CM

## 2013-12-06 LAB — BASIC METABOLIC PANEL
BUN: 41 mg/dL — ABNORMAL HIGH (ref 6–23)
CO2: 28 mEq/L (ref 19–32)
Calcium: 8.1 mg/dL — ABNORMAL LOW (ref 8.4–10.5)
Chloride: 98 mEq/L (ref 96–112)
Creatinine, Ser: 2.38 mg/dL — ABNORMAL HIGH (ref 0.50–1.10)
GFR calc Af Amer: 20 mL/min — ABNORMAL LOW (ref >90)
GFR calc non Af Amer: 17 mL/min — ABNORMAL LOW (ref >90)
Glucose, Bld: 104 mg/dL — ABNORMAL HIGH (ref 70–99)
Potassium: 3.8 mEq/L (ref 3.7–5.3)
Sodium: 138 mEq/L (ref 137–147)

## 2013-12-06 MED ORDER — FUROSEMIDE 40 MG PO TABS
40.0000 mg | ORAL_TABLET | Freq: Every day | ORAL | Status: DC
  Filled 2013-12-06 (×3): qty 1

## 2013-12-06 NOTE — Telephone Encounter (Signed)
Pt still on inpatient service at [facility]

## 2013-12-06 NOTE — Progress Notes (Signed)
SUBJECTIVE:  The patient states that she slept very well and overall feels much better. SOB has improved, no more chest pain.    OBJECTIVE:   Vitals:    Filed Vitals:   12/05/13 0449 12/05/13 1314 12/05/13 2003 12/06/13 0641  BP: 107/51 125/62 106/51 128/60  Pulse: 66 64 67 57  Temp: 97.6 F (36.4 C) 97.7 F (36.5 C) 98 F (36.7 C) 97.3 F (36.3 C)  TempSrc: Oral Oral Oral Oral  Resp: 18 18 18 20   Height:      Weight: 145 lb 4.5 oz (65.9 kg)   147 lb 7.8 oz (66.9 kg)  SpO2: 94% 95% 94% 93%   I&O's:    Intake/Output Summary (Last 24 hours) at 12/06/13 0809 Last data filed at 12/06/13 0630  Gross per 24 hour  Intake    730 ml  Output   1150 ml  Net   -420 ml   TELEMETRY: Reviewed telemetry pt in NSR:  . amiodarone  200 mg Oral Daily  . amLODipine  5 mg Oral Daily  . apixaban  2.5 mg Oral BID  . furosemide  40 mg Oral Daily  . irbesartan  150 mg Oral Daily  . levothyroxine  88 mcg Oral QAC breakfast  . nitroGLYCERIN  0.1 mg Transdermal Daily  . pantoprazole  40 mg Oral Daily  . sodium chloride  3 mL Intravenous Q12H  . sodium chloride  3 mL Intravenous Q12H    PHYSICAL EXAM General: Well developed, well nourished, in no acute distress Head: Eyes PERRLA, No xanthomas.   Normal cephalic and atramatic  Lungs:   CTA Heart:   HRRR S1 S2 Pulses are 2+ & equal. Abdomen: Bowel sounds are positive, abdomen soft and non-tender without masses  Extremities:   No clubbing, cyanosis or edema.  DP +1 Neuro: Alert and oriented X 3. Psych:  Good affect, responds appropriately   LABS: Basic Metabolic Panel:  Recent Labs  12/05/13 0938 12/06/13 0544  NA 143 138  K 4.3 3.8  CL 100 98  CO2 26 28  GLUCOSE 178* 104*  BUN 33* 41*  CREATININE 1.94* 2.38*  CALCIUM 9.2 8.1*    Recent Labs  12/03/13 1715  WBC 3.7*  HGB 11.2*  HCT 34.3*  MCV 91.7  PLT 203   Cardiac Enzymes:  Recent Labs  12/04/13 0738 12/04/13 1153 12/04/13 1755  TROPONINI 1.59* 1.13*  0.93*    Recent Labs  12/03/13 1745  TSH 1.455    RADIOLOGY: Dg Chest 2 View  12/03/2013   CLINICAL DATA:  Shortness of breath. Chest tightness. Atrial fibrillation and asthma.  EXAM: CHEST  2 VIEW  COMPARISON:  Acute abdominal series 11/01/2013.  FINDINGS: Cardiac enlargement is again noted. Moderate pulmonary vascular and mild edema is new. New bilateral pleural effusions are evident, right greater than left. Bibasilar airspace disease is present.  IMPRESSION: 1. Interval development of congestive heart failure. 2. Bibasilar airspace disease likely reflects atelectasis. Infection is not excluded.   Electronically Signed   By: Lawrence Santiago M.D.   On: 12/03/2013 17:54   Echo: 12/04/2013 Study Conclusions  - Left ventricle: The cavity size was normal. Wall thickness was increased in a pattern of mild LVH. There was mild concentric hypertrophy. Systolic function was normal. The estimated ejection fraction was in the range of 55% to 60%. Although no diagnostic regional wall motion abnormality was identified, this possibility cannot be completely excluded on the basis of this study. Doppler parameters are  consistent with elevated mean left atrial filling pressure. - Ventricular septum: Septal motion showed abnormal function, dyssynergy, and "bounce". These changes are consistent with intraventricular conduction delay. - Aortic valve: Mildly calcified annulus. Probably trileaflet; normal thickness leaflets. Mild calcification. Trivial regurgitation. - Mitral valve: Mild regurgitation. - Left atrium: The atrium was severely dilated. - Right ventricle: The cavity size was mildly dilated. Wall thickness was normal. - Right atrium: The atrium was severely dilated. - Pulmonary arteries: Systolic pressure was moderately increased. PA peak pressure: 41mm Hg (S). Impressions:  - An atrial fibrillation rhythm was noted on this study, making this study technically difficult for the  assessment of cardiac function.  Lexiscan nuclear stress test: 11/02/13 FINDINGS:  SPECT: No definite inducible or reversible ischemia with  pharmacologic stress. No fixed defects.  Wall motion: Grossly normal wall motion and thickening  Ejection fraction: Calculated ejection fraction is 60%.  IMPRESSION:  Negative for inducible ischemia. 60% ejection fraction   Telemetry: SR   IMPRESSIONS:   1. Acute on chronic diastolic congestive heart failure - she has put out 4.5 L in the last 48 hours.  Weight is down 6 pounds. 2. Chest discomfort with borderline elevation of troponin that may be ischemic  3. Paroxysmal atrial fibrillation currently in sinus rhythm  4. Tachycardia-bradycardia syndrome  5. Hypertensive heart disease with accelerated hypertension - needs aggressive treatment of HTN  6. Anxiety  7. Hyperlipidemia  8. Anemia - needs to have workup for this since on anticoagulation if this has not been done recently 9. Acute on chronic renal insufficiency    RECOMMENDATION:   -  She has had a thorough workup recently including echocardiogram, myocardial perfusion scanning, troponins are trending down, no further work up at this point  - She is currently in sinus rhythm. She has been intolerant recently metoprolol due to bradycardia.   - Continue Eliqius/amiodarone   - The blood pressure is now controlled after adding amlodipine  - hold Lasix today and tomorrow  - repeat BMP   Ena Dawley, Lemmie Evens, MD  12/06/2013  8:09 AM

## 2013-12-06 NOTE — Care Management Note (Signed)
    Page 1 of 1   12/06/2013     2:42:37 PM   CARE MANAGEMENT NOTE 12/06/2013  Patient:  [name], [name]   Account Number:  [id number]  Date Initiated:  12/06/2013  Documentation initiated by:  Dessa Phi  Subjective/Objective Assessment:   80 Y/O F ADMITTED W/CHF.     Action/Plan:   FROM HOME.HAS PCP,PHARMACY.   Anticipated DC Date:  12/06/2013   Anticipated DC Plan:  [place]  CM consult      Choice offered to / List presented to:             Status of service:  In process, will continue to follow Medicare Important Message given?   (If response is "NO", the following Medicare IM given date fields will be blank) Date Medicare IM given:   Date Additional Medicare IM given:    Discharge Disposition:    Per UR Regulation:  Reviewed for med. necessity/level of care/duration of stay  If discussed at Long Length of Stay Meetings, dates discussed:    Comments:  12/06/13 Lindsey Canny RN,BSN NCM [phone] NO ANTICIPATED D/C NEEDS.

## 2013-12-06 NOTE — Progress Notes (Signed)
[facility] PROGRESS NOTE  [name] [id number] DOB: Jul 17, 1924 DOA: 12/03/2013 PCP: [name], MD  Assessment/Plan: 1. chest pain episode with mildly elevated troponin  -Patient chest pain-free this a.m. -on ARB, Eliquis(aspirin DC'd). per cards she has been intolerant of beta blocker. -she had for workup including echo myocardial perfusion scan recently -continuing medical managenment, and cards recommending d/c in am 2. mild congestive heart failure / pulmonary edema -Cr up to 2.38, agree with holding Lasix today and tomorrow as per cards -on  ARB and amiodarone  3. paroxysmal atrial fib on eliquis -Rate controlled, continue amiodarone and eliquis  4. hypertension uncontrolled -Continue ARB  5. Chronic renal failure  - Holding Lasix due to bump in creatinine as above, follow and recheck 6. Hypothyroidism -TSH within normal limits -Continue Synthroid   Code Status: full Family Communication: Family at bedside Disposition Plan: To home when medically ready   Consultants:  cards  Procedures:  none  Antibiotics:  none   HPI/Subjective: Denies shortness of breath and no chest pain.  Objective: Filed Vitals:   12/06/13 1444  BP: 107/36  Pulse: 62  Temp: 98 F (36.7 C)  Resp: 20    Intake/Output Summary (Last 24 hours) at 12/06/13 1852 Last data filed at 12/06/13 1445  Gross per 24 hour  Intake    560 ml  Output    575 ml  Net    -15 ml   Filed Weights   12/04/13 0340 12/05/13 0449 12/06/13 0641  Weight: 70.1 kg (154 lb 8.7 oz) 65.9 kg (145 lb 4.5 oz) 66.9 kg (147 lb 7.8 oz)    Exam:  General: alert & oriented x 3 In NAD Cardiovascular: RRR, nl S1 s2 Respiratory: Decreased breath sounds at the bases, otherwise clear. Abdomen: soft +BS NT/ND, no masses palpable Extremities: No cyanosis and trace edema    Data Reviewed: Basic Metabolic Panel:  Recent Labs Lab 12/03/13 1715 12/05/13 0938 12/06/13 0544  NA 141 143 138  K  4.8 4.3 3.8  CL 104 100 98  CO2 22 26 28   GLUCOSE 132* 178* 104*  BUN 37* 33* 41*  CREATININE 1.72* 1.94* 2.38*  CALCIUM 9.4 9.2 8.1*   Liver Function Tests: No results found for this basename: AST, ALT, ALKPHOS, BILITOT, PROT, ALBUMIN,  in the last 168 hours No results found for this basename: LIPASE, AMYLASE,  in the last 168 hours No results found for this basename: AMMONIA,  in the last 168 hours CBC:  Recent Labs Lab 12/01/13 1149 12/03/13 1715  WBC 4.6 3.7*  HGB 10.9* 11.2*  HCT 33.8* 34.3*  MCV 89.7 91.7  PLT 226 203   Cardiac Enzymes:  Recent Labs Lab 12/04/13 0738 12/04/13 1153 12/04/13 1755  TROPONINI 1.59* 1.13* 0.93*   BNP (last 3 results)  Recent Labs  12/03/13 1715  PROBNP 3878.0*   CBG: No results found for this basename: GLUCAP,  in the last 168 hours  No results found for this or any previous visit (from the past 240 hour(s)).   Studies: No results found.  Scheduled Meds: . amiodarone  200 mg Oral Daily  . amLODipine  5 mg Oral Daily  . apixaban  2.5 mg Oral BID  . furosemide  40 mg Oral Daily  . irbesartan  150 mg Oral Daily  . levothyroxine  88 mcg Oral QAC breakfast  . nitroGLYCERIN  0.1 mg Transdermal Daily  . pantoprazole  40 mg Oral Daily  . sodium chloride  3 mL  Intravenous Q12H  . sodium chloride  3 mL Intravenous Q12H   Continuous Infusions:   Principal Problem:   Acute on chronic diastolic congestive heart failure Active Problems:   Hypertensive heart disease   Chronic kidney disease stage III (GFR 30-59 ml/min)   Paroxysmal atrial fibrillation   Chronic anticoagulation- Eliquis   Chest pain   H/O tachycardia-bradycardia syndrome    Time spent: [facility] Pager [phone]. If 7PM-7AM, please contact night-coverage at [email or web address], password [name] 12/06/2013, 6:52 PM  LOS: 3 days

## 2013-12-07 DIAGNOSIS — Z7901 Long term (current) use of anticoagulants: Secondary | ICD-10-CM

## 2013-12-07 DIAGNOSIS — R079 Chest pain, unspecified: Secondary | ICD-10-CM

## 2013-12-07 LAB — BASIC METABOLIC PANEL
BUN: 45 mg/dL — ABNORMAL HIGH (ref 6–23)
CO2: 26 mEq/L (ref 19–32)
Calcium: 7.4 mg/dL — ABNORMAL LOW (ref 8.4–10.5)
Chloride: 99 mEq/L (ref 96–112)
Creatinine, Ser: 2.49 mg/dL — ABNORMAL HIGH (ref 0.50–1.10)
GFR calc Af Amer: 19 mL/min — ABNORMAL LOW (ref >90)
GFR calc non Af Amer: 16 mL/min — ABNORMAL LOW (ref >90)
Glucose, Bld: 92 mg/dL (ref 70–99)
Potassium: 4.1 mEq/L (ref 3.7–5.3)
Sodium: 139 mEq/L (ref 137–147)

## 2013-12-07 MED ORDER — AMLODIPINE BESYLATE 5 MG PO TABS: 5.0000 mg | ORAL_TABLET | Freq: Every day | ORAL | Status: DC

## 2013-12-07 MED ORDER — ISOSORBIDE MONONITRATE ER 30 MG PO TB24: 30.0000 mg | ORAL_TABLET | Freq: Every day | ORAL | Status: DC

## 2013-12-07 MED ORDER — ISOSORBIDE MONONITRATE ER 30 MG PO TB24
30.0000 mg | ORAL_TABLET | Freq: Every day | ORAL | Status: DC
  Administered 2013-12-07: 30 mg via ORAL
  Filled 2013-12-07: qty 1

## 2013-12-07 MED ORDER — IRBESARTAN 150 MG PO TABS: 150.0000 mg | ORAL_TABLET | Freq: Every day | ORAL | Status: DC

## 2013-12-07 NOTE — Progress Notes (Addendum)
SUBJECTIVE:  The patient states that she slept very well and overall feels much better. SOB has improved, no more chest pain.    OBJECTIVE:   Vitals:    Filed Vitals:   12/06/13 0641 12/06/13 1444 12/06/13 2055 12/07/13 0509  BP: 128/60 107/36 123/63 113/43  Pulse: 57 62 65 55  Temp: 97.3 F (36.3 C) 98 F (36.7 C) 97.5 F (36.4 C) 97.6 F (36.4 C)  TempSrc: Oral Oral Oral Oral  Resp: 20 20 16 16   Height:      Weight: 147 lb 7.8 oz (66.9 kg)   150 lb 12.7 oz (68.4 kg)  SpO2: 93% 95% 99% 95%   I&O's:    Intake/Output Summary (Last 24 hours) at 12/07/13 0905 Last data filed at 12/06/13 2056  Gross per 24 hour  Intake    560 ml  Output    211 ml  Net    349 ml   TELEMETRY: Reviewed telemetry pt in NSR:  . amiodarone  200 mg Oral Daily  . amLODipine  5 mg Oral Daily  . apixaban  2.5 mg Oral BID  . furosemide  40 mg Oral Daily  . irbesartan  150 mg Oral Daily  . levothyroxine  88 mcg Oral QAC breakfast  . nitroGLYCERIN  0.1 mg Transdermal Daily  . pantoprazole  40 mg Oral Daily  . sodium chloride  3 mL Intravenous Q12H  . sodium chloride  3 mL Intravenous Q12H    PHYSICAL EXAM General: Well developed, well nourished, in no acute distress Head: Eyes PERRLA, No xanthomas.   Normal cephalic and atramatic  Lungs:   CTA Heart:   HRRR S1 S2 Pulses are 2+ & equal. Abdomen: Bowel sounds are positive, abdomen soft and non-tender without masses  Extremities:   No clubbing, cyanosis or edema.  DP +1 Neuro: Alert and oriented X 3. Psych:  Good affect, responds appropriately   LABS: Basic Metabolic Panel:  Recent Labs  12/06/13 0544 12/07/13 0445  NA 138 139  K 3.8 4.1  CL 98 99  CO2 28 26  GLUCOSE 104* 92  BUN 41* 45*  CREATININE 2.38* 2.49*  CALCIUM 8.1* 7.4*    Recent Labs  12/04/13 1153 12/04/13 1755  TROPONINI 1.13* 0.93*   No results found for this basename: TSH, T4TOTAL, FREET3, T3FREE, THYROIDAB,  in the last 72 hours  RADIOLOGY: Dg Chest 2  View  12/03/2013   CLINICAL DATA:  Shortness of breath. Chest tightness. Atrial fibrillation and asthma.  EXAM: CHEST  2 VIEW  COMPARISON:  Acute abdominal series 11/01/2013.  FINDINGS: Cardiac enlargement is again noted. Moderate pulmonary vascular and mild edema is new. New bilateral pleural effusions are evident, right greater than left. Bibasilar airspace disease is present.  IMPRESSION: 1. Interval development of congestive heart failure. 2. Bibasilar airspace disease likely reflects atelectasis. Infection is not excluded.   Electronically Signed   By: Lawrence Santiago M.D.   On: 12/03/2013 17:54   Echo: 12/04/2013 Study Conclusions  - Left ventricle: The cavity size was normal. Wall thickness was increased in a pattern of mild LVH. There was mild concentric hypertrophy. Systolic function was normal. The estimated ejection fraction was in the range of 55% to 60%. Although no diagnostic regional wall motion abnormality was identified, this possibility cannot be completely excluded on the basis of this study. Doppler parameters are consistent with elevated mean left atrial filling pressure. - Ventricular septum: Septal motion showed abnormal function, dyssynergy, and "bounce". These  changes are consistent with intraventricular conduction delay. - Aortic valve: Mildly calcified annulus. Probably trileaflet; normal thickness leaflets. Mild calcification. Trivial regurgitation. - Mitral valve: Mild regurgitation. - Left atrium: The atrium was severely dilated. - Right ventricle: The cavity size was mildly dilated. Wall thickness was normal. - Right atrium: The atrium was severely dilated. - Pulmonary arteries: Systolic pressure was moderately increased. PA peak pressure: 11mm Hg (S). Impressions:  - An atrial fibrillation rhythm was noted on this study, making this study technically difficult for the assessment of cardiac function.  Lexiscan nuclear stress test: 11/02/13 FINDINGS:    SPECT: No definite inducible or reversible ischemia with  pharmacologic stress. No fixed defects.  Wall motion: Grossly normal wall motion and thickening  Ejection fraction: Calculated ejection fraction is 60%.  IMPRESSION:  Negative for inducible ischemia. 60% ejection fraction   Telemetry: SR   IMPRESSIONS/Plan:   1. Acute on chronic diastolic congestive heart failure - she has put out 4.5 L in the first two days of diuresis.  Weight is down 6 pounds. Now worsening Crea, we will hold Lasix. The patient is euvolemic.   2. Acute on chronic renal insufficiency - worsening after lasix and starting irbesartan, we will hold both  3. Chest discomfort with borderline elevation of troponin. She has had a thorough workup recently including echocardiogram, myocardial perfusion scanning, troponins are trending down, no further work up at this point  4. Paroxysmal atrial fibrillation. She is currently in sinus rhythm. She has been intolerant recently metoprolol due to bradycardia.   5. Tachycardia-bradycardia syndrome  6. Hypertensive heart disease with accelerated hypertension - needs aggressive treatment of HTN, controlled on current regimen   7. Hyperlipidemia   8. Anemia - needs to have workup for this since on anticoagulation if this has not been done recently  Recommendation : We will hold lasix and irbesartan as there is progressively worsening kidney function. The patient is euvolemic and wishes to go home. We will hold lasix and irbesartan, start imdur 30 mg po daily. Its ok for the patient to go home if she follow in the clinic this Thursday (appt already scheduled) with repeated BMP. She should also follow with renal as an outpatient.    Ena Dawley, Lemmie Evens, MD  12/07/2013  9:05 AM

## 2013-12-07 NOTE — Discharge Summary (Signed)
Physician Discharge Summary  [name] [id number] DOB: May 17, 1924 DOA: 12/03/2013  PCP: [name], MD  Admit date: 12/03/2013 Discharge date: 12/07/2013  Time spent: >30 minutes  Recommendations for Outpatient Follow-up:  Follow-up Information   Follow up with [name], MD. (in 1week, call for appt on discharge)    Specialty:  Family Medicine   Contact information:   [place] [address] [phone]       Please follow up. (Cardiology as scheduled on 1/29)      Follow up with PCP as above for referral to outpatient nephrology   Discharge Diagnoses:  Principal Problem:   Acute on chronic diastolic congestive heart failure Active Problems:   Hypertensive heart disease   Chronic kidney disease stage III (GFR 30-59 ml/min)   Paroxysmal atrial fibrillation   Chronic anticoagulation- Eliquis   Chest pain   H/O tachycardia-bradycardia syndrome   Discharge Condition: improved/stable  Diet recommendation: Heart healthy  Filed Weights   12/05/13 0449 12/06/13 0641 12/07/13 0509  Weight: 65.9 kg (145 lb 4.5 oz) 66.9 kg (147 lb 7.8 oz) 68.4 kg (150 lb 12.7 oz)    History of present illness:  The Patient is an 80 y.o. female, with past medical history significant for atrial Fib , and hypertension presenting today with few hours history of chest heaviness associated with shortness of breath, no nausea vomiting fever or chills, no palpitations no cold sweats. For the last 1 week , patient has been having these episodes and feels they are basically incapacitating her. No history of leg edema although she thinks that she gained weight lately. She was admitted for further evaluation and management.   Hospital Course:  1. chest pain episode with mildly elevated troponin  -As above, upon admission cardiac enzymes were cycled and were elevated. -She was consulted and saw patient and medical management was recommended>> maintained on ARB and  Eliquis(aspirin was DC'd per cards). Cards stated that pt has been intolerant of beta blocker.  -she had for workup including echo myocardial perfusion scan recently  -Echocardiogram done 1/24 showed an EF of 60%. Patient did not have any further chest pain in the hospital. Her creatinine was continuing to trend up even after Lasix was discontinued and so the ARB is held today and patient has been instructed to hold off ARB on discharge follow up outpatient as scheduled with cardiology on 1/29. She was placed on Imdur wishes to continue upon discharge She is to have repeat labs on followup and further management as clinically appropriate. Discussed patient with Dr. Meda Coffee and she states okay to discharge and to followup on 1/29 as scheduled 2. mild congestive heart failure / pulmonary edema  -Patient was diuresed with IV Lasix upon admission and her symptoms resolved. On followup her creatinine was trending up and so Lasix was held and continued to trend up today as well and so her ARB has been held as well as discussed above.  -she is to continue amiodarone, and Imdur started today. 3. paroxysmal atrial fib on eliquis  -Rate controlled, continue amiodarone and eliquis  4. hypertension uncontrolled  -  5. Chronic renal failure  - As discussed above, creatinine continuing to trend up today-she is to continue to hold off Lasix in the ARB>> follow up in the cardiology clinic on 1/29 as scheduled for repeat labs. -she is also to see her PCP for referral to outpatient nephrology 6. Hypothyroidism  -TSH within normal limits  -Continue Synthroid  Procedures:  echo Study Conclusions  - Left ventricle: The cavity size was normal. Wall thickness was increased in a pattern of mild LVH. There was mild concentric hypertrophy. Systolic function was normal. The estimated ejection fraction was in the range of 55% to 60%. Although no diagnostic regional wall motion abnormality was identified, this  possibility cannot be completely excluded on the basis of this study. Doppler parameters are consistent with elevated mean left atrial filling pressure. - Ventricular septum: Septal motion showed abnormal function, dyssynergy, and "bounce". These changes are consistent with intraventricular conduction delay. - Aortic valve: Mildly calcified annulus. Probably trileaflet; normal thickness leaflets. Mild calcification. Trivial regurgitation. - Mitral valve: Mild regurgitation. - Left atrium: The atrium was severely dilated. - Right ventricle: The cavity size was mildly dilated. Wall thickness was normal. - Right atrium: The atrium was severely dilated. - Pulmonary arteries: Systolic pressure was moderately increased. PA peak pressure: 52mm Hg (S). Impressions:  - An atrial fibrillation rhythm was noted on this study, making this study technically difficult for the assessment of cardiac function.   Consultations:  cardiology  Discharge Exam: Filed Vitals:   12/07/13 0509  BP: 113/43  Pulse: 55  Temp: 97.6 F (36.4 C)  Resp: 16    Exam:  General: alert & oriented x 3 In NAD  Cardiovascular: RRR, nl S1 s2  Respiratory: Decreased breath sounds at the bases, otherwise clear.  Abdomen: soft +BS NT/ND, no masses palpable  Extremities: No cyanosis and trace edema  Discharge Instructions  Discharge Orders   Future Appointments Provider Department Dept Phone   12/09/2013 3:00 PM Erlene Quan, PA-C [facility] [phone]   Future Orders Complete By Expires   Diet - low sodium heart healthy  As directed    Increase activity slowly  As directed        Medication List    STOP taking these medications Aspirin 81mg  daily        irbesartan 150 MG tablet  Commonly known as:  AVAPRO      TAKE these medications       amiodarone 200 MG tablet  Commonly known as:  PACERONE  Take 200 mg by mouth daily.     amLODipine 5 MG tablet  Commonly known as:   NORVASC  Take 1 tablet (5 mg total) by mouth daily.     apixaban 2.5 MG Tabs tablet  Commonly known as:  ELIQUIS  Take 1 tablet (2.5 mg total) by mouth 2 (two) times daily.     cyanocobalamin 1000 MCG tablet  Take 1 tablet (1,000 mcg total) by mouth daily. Contact PCP for refills.     hyoscyamine 0.125 MG tablet  Commonly known as:  LEVSIN, ANASPAZ  Take 1-2 tablets by mouth or under tongue every 4 hours as needed before meals     isosorbide mononitrate 30 MG 24 hr tablet  Commonly known as:  IMDUR  Take 1 tablet (30 mg total) by mouth daily.     levothyroxine 88 MCG tablet  Commonly known as:  SYNTHROID, LEVOTHROID  Take 88 mcg by mouth daily before breakfast.     omeprazole 40 MG capsule  Commonly known as:  PRILOSEC  Take 1 capsule (40 mg total) by mouth daily.     temazepam 30 MG capsule  Commonly known as:  RESTORIL  Take 30 mg by mouth at bedtime as needed for sleep (restless leg).     traMADol 50 MG tablet  Commonly known as:  Veatrice Bourbon  Take 1 tablet (50 mg total) by mouth every 6 (six) hours as needed.       Allergies  Allergen Reactions  . Amoxicillin     REACTION: unspecified  . Codeine Phosphate     REACTION: unspecified  . Lipitor [Atorvastatin] Other (See Comments)    "makes legs jump all night"  . Penicillins     REACTION: nausea, swelling  . Xifaxan [Rifaximin] Other (See Comments)    unknown       Follow-up Information   Follow up with FRY,STEPHEN A, MD. (in 1week, call for appt on discharge)    Specialty:  Family Medicine   Contact information:   [place] [address] [phone]       Please follow up. (Cardiology as scheduled on 1/29)        The results of significant diagnostics from this hospitalization (including imaging, microbiology, ancillary and laboratory) are listed below for reference.    Significant Diagnostic Studies: Dg Chest 2 View  12/03/2013   CLINICAL DATA:  Shortness of breath. Chest  tightness. Atrial fibrillation and asthma.  EXAM: CHEST  2 VIEW  COMPARISON:  Acute abdominal series 11/01/2013.  FINDINGS: Cardiac enlargement is again noted. Moderate pulmonary vascular and mild edema is new. New bilateral pleural effusions are evident, right greater than left. Bibasilar airspace disease is present.  IMPRESSION: 1. Interval development of congestive heart failure. 2. Bibasilar airspace disease likely reflects atelectasis. Infection is not excluded.   Electronically Signed   By: Lawrence Santiago M.D.   On: 12/03/2013 17:54    Microbiology: No results found for this or any previous visit (from the past 240 hour(s)).   Labs: Basic Metabolic Panel:  Recent Labs Lab 12/03/13 1715 12/05/13 0938 12/06/13 0544 12/07/13 0445  NA 141 143 138 139  K 4.8 4.3 3.8 4.1  CL 104 100 98 99  CO2 22 26 28 26   GLUCOSE 132* 178* 104* 92  BUN 37* 33* 41* 45*  CREATININE 1.72* 1.94* 2.38* 2.49*  CALCIUM 9.4 9.2 8.1* 7.4*   Liver Function Tests: No results found for this basename: AST, ALT, ALKPHOS, BILITOT, PROT, ALBUMIN,  in the last 168 hours No results found for this basename: LIPASE, AMYLASE,  in the last 168 hours No results found for this basename: AMMONIA,  in the last 168 hours CBC:  Recent Labs Lab 12/01/13 1149 12/03/13 1715  WBC 4.6 3.7*  HGB 10.9* 11.2*  HCT 33.8* 34.3*  MCV 89.7 91.7  PLT 226 203   Cardiac Enzymes:  Recent Labs Lab 12/04/13 0738 12/04/13 1153 12/04/13 1755  TROPONINI 1.59* 1.13* 0.93*   BNP: BNP (last 3 results)  Recent Labs  12/03/13 1715  PROBNP 3878.0*   CBG: No results found for this basename: GLUCAP,  in the last 168 hours     Signed:  Arsen Mangione C  [facility] 12/07/2013, 1:14 PM

## 2013-12-07 NOTE — Progress Notes (Signed)
Pt discharged home with family. Discharge instructions reviewed with pt. Pt verbalized understanding

## 2013-12-07 NOTE — Progress Notes (Signed)
Patient evaluated for [facility] services. Met with patient at bedside to explain and offer services. She states she is pretty active and is "on the go" when she is at home. She pleasantly declined services and accepted [facility] Care Management brochure to call in case she changes her mind in the future. Appreciative of visit. Will make inpatient RNCM aware.  Marthenia Rolling, MSN- [facility] Liaison817 [phone]

## 2013-12-09 ENCOUNTER — Telehealth: Payer: Self-pay | Admitting: *Deleted

## 2013-12-09 ENCOUNTER — Ambulatory Visit (INDEPENDENT_AMBULATORY_CARE_PROVIDER_SITE_OTHER): Payer: Medicare Other | Admitting: Cardiology

## 2013-12-09 ENCOUNTER — Encounter: Payer: Self-pay | Admitting: Cardiology

## 2013-12-09 VITALS — BP 120/60 | HR 60 | Ht 64.0 in | Wt 152.0 lb

## 2013-12-09 DIAGNOSIS — I509 Heart failure, unspecified: Secondary | ICD-10-CM

## 2013-12-09 DIAGNOSIS — N183 Chronic kidney disease, stage 3 unspecified: Secondary | ICD-10-CM

## 2013-12-09 DIAGNOSIS — I480 Paroxysmal atrial fibrillation: Secondary | ICD-10-CM

## 2013-12-09 DIAGNOSIS — I5032 Chronic diastolic (congestive) heart failure: Secondary | ICD-10-CM

## 2013-12-09 DIAGNOSIS — I4891 Unspecified atrial fibrillation: Secondary | ICD-10-CM

## 2013-12-09 DIAGNOSIS — I5033 Acute on chronic diastolic (congestive) heart failure: Secondary | ICD-10-CM

## 2013-12-09 DIAGNOSIS — Z7901 Long term (current) use of anticoagulants: Secondary | ICD-10-CM

## 2013-12-09 DIAGNOSIS — I119 Hypertensive heart disease without heart failure: Secondary | ICD-10-CM

## 2013-12-09 DIAGNOSIS — I447 Left bundle-branch block, unspecified: Secondary | ICD-10-CM

## 2013-12-09 NOTE — Progress Notes (Signed)
12/09/2013 [name]   04/11/1924  [id number]  Primary Physicia [name], MD Primary Cardiologist: [name]  HPI:    Pleasant 80 y/o female with a remote history of PAF. She had been in NSR for years. We saw he in the ER at [facility] 11/01/13 when she presented with multiple somatic comlpaints and was noted to be in AF with a new LBBB. Beta blocker was added. A Myoview was low risk. Echo showed good LVF with RAE, LAE, and PA pressures of 47 mmHg. TSH, LFTs, electrolytes were WNL. She was noted to be anemic. She had seen Dr Sonny Dandy, then Dr Marin Olp for this in the past. She had prior GI evaluation in June 2013. She is iron deficient but has had an intolerance to po Iron.  Marland Kitchen     She was just seen 12/01/13 and was noted to be bradycardic and SOB. We stopped her beta blocker and decreased her Amiodarone. She ended up at [facility] 12/03/13 with CHF. Her wgt was 160. She diuresed quickly but then had a jump in her SCr and her ARB and diuretics were held. She is in the office today for follow up. She feels much better. Her wgt at home is 150.    Current Outpatient Prescriptions  Medication Sig Dispense Refill  . amiodarone (PACERONE) 200 MG tablet Take 200 mg by mouth daily.      Marland Kitchen amLODipine (NORVASC) 5 MG tablet Take 1 tablet (5 mg total) by mouth daily.  30 tablet  0  . apixaban (ELIQUIS) 2.5 MG TABS tablet Take 1 tablet (2.5 mg total) by mouth 2 (two) times daily.  60 tablet  6  . cyanocobalamin 1000 MCG tablet Take 1 tablet (1,000 mcg total) by mouth daily. Contact PCP for refills.  30 tablet  0  . hyoscyamine (LEVSIN, ANASPAZ) 0.125 MG tablet Take 1-2 tablets by mouth or under tongue every 4 hours as needed before meals  360 tablet  5  . isosorbide mononitrate (IMDUR) 30 MG 24 hr tablet Take 1 tablet (30 mg total) by mouth daily.  30 tablet  0  . levothyroxine (SYNTHROID, LEVOTHROID) 88 MCG tablet Take 88 mcg by mouth daily before breakfast.      . omeprazole (PRILOSEC) 40 MG capsule Take 1 capsule (40  mg total) by mouth daily.  30 capsule  11  . temazepam (RESTORIL) 30 MG capsule Take 30 mg by mouth at bedtime as needed for sleep (restless leg).      . traMADol (ULTRAM) 50 MG tablet Take 1 tablet (50 mg total) by mouth every 6 (six) hours as needed.  60 tablet  2   No current facility-administered medications for this visit.    Allergies  Allergen Reactions  . Amoxicillin     REACTION: unspecified  . Codeine Phosphate     REACTION: unspecified  . Lipitor [Atorvastatin] Other (See Comments)    "makes legs jump all night"  . Penicillins     REACTION: nausea, swelling  . Xifaxan [Rifaximin] Other (See Comments)    unknown    History   Social History  . Marital Status: Widowed    Spouse Name: N/A    Number of Children: N/A  . Years of Education: N/A   Occupational History  . Not on file.   Social History Main Topics  . Smoking status: Never Smoker   . Smokeless tobacco: Never Used  . Alcohol Use: Yes     Comment: social wine intake  . Drug  Use: No  . Sexual Activity: No   Other Topics Concern  . Not on file   Social History Narrative  . No narrative on file     Review of Systems: General: negative for chills, fever, night sweats or weight changes.  Cardiovascular: negative for chest pain, dyspnea on exertion, edema, orthopnea, palpitations, paroxysmal nocturnal dyspnea or shortness of breath Dermatological: negative for rash Respiratory: negative for cough or wheezing Urologic: negative for hematuria Abdominal: negative for nausea, vomiting, diarrhea, bright red blood per rectum, melena, or hematemesis Neurologic: negative for visual changes, syncope, or dizziness All other systems reviewed and are otherwise negative except as noted above.    Blood pressure 120/60, pulse 60, height 5\' 4"  (1.626 m), weight 152 lb (68.947 kg).  General appearance: alert, cooperative and no distress Lungs: clear to auscultation bilaterally Heart: regular rate and  rhythm Extremities: no edmema  EKG NSR, LBBB  ASSESSMENT AND PLAN:   Acute on chronic diastolic congestive heart failure Recently discharged  Chronic kidney disease stage III (GFR 30-59 ml/min) Lasix and ARB stopped at discharge  Chronic diastolic heart failure .  Chronic anticoagulation- Eliquis .  Hypertensive heart disease no significant RAS by MRA 2002  LBBB (left bundle branch block)- new 11/01/13 .  Paroxysmal atrial fibrillation NSR today   PLAN  Watch wgt, if > 155 she should contact us about a diuretic. Follow up with her cardiologist soon.  [facility] KPA-C 12/09/2013 5:23 PM

## 2013-12-09 NOTE — Assessment & Plan Note (Signed)
Lasix and ARB stopped at discharge

## 2013-12-09 NOTE — Assessment & Plan Note (Signed)
Recently discharged

## 2013-12-09 NOTE — Assessment & Plan Note (Signed)
NSR today 

## 2013-12-09 NOTE — Assessment & Plan Note (Addendum)
no significant RAS by MRA 2002

## 2013-12-09 NOTE — Patient Instructions (Signed)
Call if our weight is higher than 155 or you become SOB again See [name] in a couple of weeks Lab today

## 2013-12-09 NOTE — Telephone Encounter (Addendum)
Admit date: 12/03/2013  Discharge date: 12/07/2013  Time spent: >30 minutes  Recommendations for Outpatient Follow-up:  Follow-up Information    Follow up with FRY,STEPHEN A, MD. (in 1week, call for appt on discharge)    Specialty: Family Medicine    Contact information:    [place] [address]  [phone]       Please follow up. (Cardiology as scheduled on 1/29)     Follow up with PCP as above for referral to outpatient nephrology  Discharge Diagnoses:  Principal Problem:  Acute on chronic diastolic congestive heart failure  Active Problems:  Hypertensive heart disease  Chronic kidney disease stage III (GFR 30-59 ml/min)  Paroxysmal atrial fibrillation  Chronic anticoagulation- Eliquis  Chest pain  H/O tachycardia-bradycardia syndrome  Discharge Condition: improved/stable  Diet recommendation: Heart healthy  Filed Weights    12/05/13 0449  12/06/13 0641  12/07/13 0509   Weight:  65.9 kg (145 lb 4.5 oz)  66.9 kg (147 lb 7.8 oz)  68.4 kg (150 lb 12.7 oz)   History of present illness:  The Patient is an 80 y.o. female, with past medical history significant for atrial Fib , and hypertension presenting today with few hours history of chest heaviness associated with shortness of breath, no nausea vomiting fever or chills, no palpitations no cold sweats. For the last 1 week , patient has been having these episodes and feels they are basically incapacitating her. No history of leg edema although she thinks that she gained weight lately. She was admitted for further evaluation and management.  Hospital Course:  1. chest pain episode with mildly elevated troponin  -As above, upon admission cardiac enzymes were cycled and were elevated.  -She was consulted and saw patient and medical management was recommended>> maintained on ARB and Eliquis(aspirin was DC'd per cards). Cards stated that pt has been intolerant of beta blocker.  -she had for workup including echo  myocardial perfusion scan recently  -Echocardiogram done 1/24 showed an EF of 60%. Patient did not have any further chest pain in the hospital. Her creatinine was continuing to trend up even after Lasix was discontinued and so the ARB is held today and patient has been instructed to hold off ARB on discharge follow up outpatient as scheduled with cardiology on 1/29. She was placed on Imdur wishes to continue upon discharge She is to have repeat labs on followup and further management as clinically appropriate. Discussed patient with Dr. Meda Coffee and she states okay to discharge and to followup on 1/29 as scheduled  2. mild congestive heart failure / pulmonary edema  -Patient was diuresed with IV Lasix upon admission and her symptoms resolved. On followup her creatinine was trending up and so Lasix was held and continued to trend up today as well and so her ARB has been held as well as discussed above.  -she is to continue amiodarone, and Imdur started today.  3. paroxysmal atrial fib on eliquis  -Rate controlled, continue amiodarone and eliquis  4. hypertension uncontrolled  -  Talked with pt and she states she is feeling better but still very weak.  She has no more chest pains.  She has a hospital follow up cardiology today and to see primary care in 1 week to discuss possible referral to nephrology.  She is ambulating around in the house without difficulty.  She is compliant with her Medication including the news medications she was discharged with, norvasc and amiodarone dosage change.  Patient  has an appointment with Dr Sarajane Jews on February 2 at 11:15 for a hospital follow-up.

## 2013-12-10 LAB — BASIC METABOLIC PANEL
BUN: 40 mg/dL — ABNORMAL HIGH (ref 6–23)
CO2: 28 mEq/L (ref 19–32)
Calcium: 8.4 mg/dL (ref 8.4–10.5)
Chloride: 97 mEq/L (ref 96–112)
Creat: 1.83 mg/dL — ABNORMAL HIGH (ref 0.50–1.10)
Glucose, Bld: 104 mg/dL — ABNORMAL HIGH (ref 70–99)
Potassium: 4.6 mEq/L (ref 3.5–5.3)
Sodium: 135 mEq/L (ref 135–145)

## 2013-12-10 NOTE — Telephone Encounter (Signed)
Noted and I agree.

## 2013-12-13 ENCOUNTER — Ambulatory Visit (INDEPENDENT_AMBULATORY_CARE_PROVIDER_SITE_OTHER): Payer: Medicare Other | Admitting: Family Medicine

## 2013-12-13 ENCOUNTER — Encounter: Payer: Self-pay | Admitting: Family Medicine

## 2013-12-13 VITALS — BP 138/60 | HR 68 | Temp 98.4°F | Ht 64.0 in | Wt 150.0 lb

## 2013-12-13 DIAGNOSIS — Z7901 Long term (current) use of anticoagulants: Secondary | ICD-10-CM

## 2013-12-13 DIAGNOSIS — I119 Hypertensive heart disease without heart failure: Secondary | ICD-10-CM

## 2013-12-13 DIAGNOSIS — N183 Chronic kidney disease, stage 3 unspecified: Secondary | ICD-10-CM

## 2013-12-13 DIAGNOSIS — I5032 Chronic diastolic (congestive) heart failure: Secondary | ICD-10-CM

## 2013-12-13 MED ORDER — CYANOCOBALAMIN 1000 MCG PO TABS: 1000.0000 ug | ORAL_TABLET | Freq: Every day | ORAL | Status: DC

## 2013-12-13 NOTE — Progress Notes (Signed)
   Subjective:    Patient ID: [name], female    DOB: 1924/07/07, 80 y.o.   MRN: [id number]  HPI Here to follow up a hospital stay from 12-03-13 to [id number] for diastolic heart failure. Her ECHO showed an EF of 60%. Her renal function has declined to a Class III level. She was carefully diuresed and she has done well since then. She denies any SOB or chest discomfort. She weighs herself daily at home and has not varied more than one lb up or down. She saw Cardiology for a follow up visit on 12-09-13 and they were pleased. Her follow up creatinine was stable at 1.83.    Review of Systems  Constitutional: Negative.   Respiratory: Negative.   Cardiovascular: Negative.        Objective:   Physical Exam  Constitutional: She appears well-developed and well-nourished.  Cardiovascular: Normal rate, regular rhythm, normal heart sounds and intact distal pulses.   Pulmonary/Chest: Effort normal and breath sounds normal.  Musculoskeletal: She exhibits no edema.  Lymphadenopathy:    She has no cervical adenopathy.          Assessment & Plan:  She is doing well. We will refer her to Nephrology to be established.

## 2013-12-13 NOTE — Progress Notes (Signed)
Pre visit review using our clinic review tool, if applicable. No additional management support is needed unless otherwise documented below in the visit note. 

## 2013-12-24 ENCOUNTER — Ambulatory Visit (INDEPENDENT_AMBULATORY_CARE_PROVIDER_SITE_OTHER): Payer: Medicare Other | Admitting: Cardiology

## 2013-12-24 VITALS — BP 150/78 | HR 52 | Ht 64.0 in | Wt 153.6 lb

## 2013-12-24 DIAGNOSIS — I129 Hypertensive chronic kidney disease with stage 1 through stage 4 chronic kidney disease, or unspecified chronic kidney disease: Secondary | ICD-10-CM

## 2013-12-24 DIAGNOSIS — I4891 Unspecified atrial fibrillation: Secondary | ICD-10-CM

## 2013-12-24 DIAGNOSIS — Z79899 Other long term (current) drug therapy: Secondary | ICD-10-CM

## 2013-12-24 DIAGNOSIS — I480 Paroxysmal atrial fibrillation: Secondary | ICD-10-CM

## 2013-12-24 DIAGNOSIS — Z7901 Long term (current) use of anticoagulants: Secondary | ICD-10-CM

## 2013-12-24 DIAGNOSIS — I447 Left bundle-branch block, unspecified: Secondary | ICD-10-CM

## 2013-12-24 DIAGNOSIS — Z8679 Personal history of other diseases of the circulatory system: Secondary | ICD-10-CM

## 2013-12-24 DIAGNOSIS — N183 Chronic kidney disease, stage 3 unspecified: Secondary | ICD-10-CM

## 2013-12-24 DIAGNOSIS — I5032 Chronic diastolic (congestive) heart failure: Secondary | ICD-10-CM

## 2013-12-24 MED ORDER — FUROSEMIDE 20 MG PO TABS
20.0000 mg | ORAL_TABLET | Freq: Every day | ORAL | Status: DC
Start: 2013-12-24 — End: 2014-02-09

## 2013-12-24 NOTE — Patient Instructions (Signed)
Labs CMP TSH  SCHEDULE FOR  PULMONARY FUNCTION TEST AT [place]  LASIX (FUROSEMIDE) 20 mg  Take everyday for 3 days then go to every other day. Your weight should be 149 lb  if weight goes to 153 take Lasix everyday until it returns to 149 lbs.  Your physician wants you to follow-up in 3 months [name].You will receive a reminder letter in the mail two months in advance. If you don't receive a letter, please call our office to schedule the follow-up appointment.

## 2013-12-25 ENCOUNTER — Encounter: Payer: Self-pay | Admitting: Cardiology

## 2013-12-25 DIAGNOSIS — I1310 Hypertensive heart and chronic kidney disease without heart failure, with stage 1 through stage 4 chronic kidney disease, or unspecified chronic kidney disease: Secondary | ICD-10-CM | POA: Insufficient documentation

## 2013-12-25 DIAGNOSIS — N184 Chronic kidney disease, stage 4 (severe): Secondary | ICD-10-CM | POA: Insufficient documentation

## 2013-12-25 NOTE — Assessment & Plan Note (Signed)
Her blood pressure is a little bit today, but she is also a little bit volume up. She is on 5 amlodipine, which may not be the best option given her edema, with the renal insufficiency the ACE inhibitor/ARB, Mrs. were discontinued. She is currently on Imdur. She does need afterload reduction for her chronic diastolic dysfunction.  We will need to consider reinitiating ARB therapy now that her renal function is stable. I'll wait for least check, is currently she's doing fine symptomatically.

## 2013-12-25 NOTE — Assessment & Plan Note (Addendum)
Diastolic dysfunction, and this did not tolerate being in atrial fibrillation. This is the main reason for keeping her out of A. fib on amiodarone. I will start her low dose of Lasix because she does have some mild edema of the head. Starting at 20 mg daily for the first few days and I will back off to every other day. She can do sliding scale to Lasix where she monitors her weight she gains more than 3 pounds from her baseline weight that she has recorded she will take daily Lasix until he gets down it goes up about 5 pounds she should take twice a day Lasix until the weight returns to normal. With her weight being up about 2.8 pounds from her baseline weight of Edison Nasuti proBNP distant see what her baseline is.

## 2013-12-25 NOTE — Progress Notes (Signed)
PATIENT: [name] MRN: [id number]  DOB: [birth year]   DOV:12/25/2013 PCP: [name], MD  Clinic Note: Chief Complaint  Patient presents with  . Follow-up    3 weeks follow-up per luke, little swelling in legs angs and feet.   HPI: [name] is a 80 y.o.  female with a PMH below who presents today for followup of her recently diagnosed atrial fibrillation with systolic heart failure. She is very pleasant elderly woman who I last saw in April of 2014 and forwarded to control hypertension hyperlipidemia hypothyroidism. She has had a relatively complicated last couple of months where she was found to have recurrence of PAF, initially to an emergency room [facility] on the 22nd December present in the A. fib with lateral branch block. She is on a beta blocker and was evaluated with a negative Myoview in a relatively normal echocardiogram. She did have mildly elevated pulmonary pressures and suggestion of diastolic dysfunction grade 2. She was anemic, but the iron deficiency, but is intolerant to by mouth iron. She was in a minute again about a month later on January 21 and was noted to be fairly bradycardic with dyspnea during a clinic visit.. Beta blocker was discontinued and she was in place and reduced the dose of amiodarone. She subsequently was admitted to [facility] with CHF requiring diuresis. She did have mild renal insufficiency as a result. She saw Kerin Ransom back in the office on 20 January and was feeling better.  Interval History: She presents here today relatively well. She really denies any recurrence of the rapid atrial fibrillation symptoms that she is noticing before. She is not not noticing any significant reduced energy or or increased fatigue. She denies any significant PND, orthopnea or edema. She has been keeping track of her weights with her target weight at home and being 149 pounds, today she is almost 3 pounds up on that and has noted little more  edema than usual.  Despite this, she denies any PND or orthopnea since her last discharge.  She is also not noted any recurrence of palpitations rapid heart beats. No chest tightness or pressure with rest or exertion. No lightheadedness, dizziness or wooziness unless she quickly stands up. No TIA or amaurosis fugax symptoms. No syncope or near syncope. Notably, no melena, hematochezia or hematuria.  Past Medical History  Diagnosis Date  . Hyperlipidemia   . Hypothyroidism   . Arthritis   . Anemia   . Asthma   . Anxiety   . IBS (irritable bowel syndrome)   . Gout   . Colon polyps   . Tubulovillous adenoma 4/07  . Hemorrhoids   . Diverticulosis   . Chronic diarrhea   . Chronic kidney disease stage III (GFR 30-59 ml/min)   . Hypertensive heart disease     no significant RAS by MRA 2002- (<30% LRAS)   . Paroxysmal atrial fibrillation 11/01/2013    Intermittent through the years and recurrent in December of 2014 treated with amiodarone;;   . Peripheral vascular disease     123456 LICA, [id number] RICA by doppler 2009   . Benign positional vertigo   . LBBB (left bundle branch block)- new 11/01/13 11/01/2013  . Diastolic dysfunction, left ventricle 11/03/2013    Prior Cardiac Evaluation and Past Surgical History: Past Surgical History  Procedure Laterality Date  . Lumbar disc surgery  2000  . Squamous cell skin cancer removed    . Oophorectomy  1962  right  . Appendectomy    . Low anterior bowel resection  7/08  . Cataract extraction    . Transthoracic echocardiogram  11/03/2013    Echocardiogram: EF 55-60%, mild LVH, elevated bili pressures. Mild aortic valve calcification.  Marland Kitchen Nm myoview ltd  11/02/2013    Negative for ischemia or infarction. EF 60%.    Allergies  Allergen Reactions  . Amoxicillin     REACTION: unspecified  . Codeine Phosphate     REACTION: unspecified  . Lipitor [Atorvastatin] Other (See Comments)    "makes legs jump all night"  . Penicillins      REACTION: nausea, swelling  . Xifaxan [Rifaximin] Other (See Comments)    unknown    Current Outpatient Prescriptions  Medication Sig Dispense Refill  . amiodarone (PACERONE) 200 MG tablet Take 200 mg by mouth daily.      Marland Kitchen amLODipine (NORVASC) 5 MG tablet Take 1 tablet (5 mg total) by mouth daily.  30 tablet  0  . apixaban (ELIQUIS) 2.5 MG TABS tablet Take 1 tablet (2.5 mg total) by mouth 2 (two) times daily.  60 tablet  6  . cyanocobalamin 1000 MCG tablet Take 1 tablet (1,000 mcg total) by mouth daily. Contact PCP for refills.  30 tablet  11  . hyoscyamine (LEVSIN, ANASPAZ) 0.125 MG tablet Take 1-2 tablets by mouth or under tongue every 4 hours as needed before meals  360 tablet  5  . isosorbide mononitrate (IMDUR) 30 MG 24 hr tablet Take 1 tablet (30 mg total) by mouth daily.  30 tablet  0  . levothyroxine (SYNTHROID, LEVOTHROID) 88 MCG tablet Take 88 mcg by mouth daily before breakfast.      . omeprazole (PRILOSEC) 40 MG capsule Take 1 capsule (40 mg total) by mouth daily.  30 capsule  11  . temazepam (RESTORIL) 30 MG capsule Take 30 mg by mouth at bedtime as needed for sleep (restless leg).       No current facility-administered medications for this visit.    History   Social History Narrative   She is a widowed mother of 2, one child is deceased. His grandmother of one. She appears to be working out routinely at [employer] using [employer]. She is otherwise quite active.   She does not drink alcohol, does not smoke.   ROS: A comprehensive Review of Systems - Negative except Mild edema, GERD. Otherwise noted above.  PHYSICAL EXAM BP 150/78  Pulse 52  Ht 5\' 4"  (1.626 m)  Wt 153 lb 9.6 oz (69.673 kg)  BMI 26.35 kg/m2 General appearance: alert, cooperative, appears stated age, no distress and Well-nourished and well-groomed. Neck: no adenopathy, no carotid bruit and no JVD Lungs: clear to auscultation bilaterally, normal percussion bilaterally and Nonlabored, good air  movement Heart: Bradycardic with regular rhythm. Split S2 but no M/R/G.Marland Kitchen Nondisplaced PMI. Abdomen: soft, non-tender; bowel sounds normal; no masses,  no organomegaly Extremities: edema  1+ left greater than right, no ulcers, gangrene or trophic changes and varicose veins noted Pulses: 2+ and symmetric Neurologic: Grossly normal  [id number] today: Yes Rate: 52 , Rhythm: Sinus bradycardia, LBBB, QTC 476. Diffuse T wave inversions. The anterolateral leads versus new from past ECGs.  Recent Labs: None since January 29. BUN/creatinine/123 improved from 45/2.19 prior to discharge.  ASSESSMENT / PLAN: Paroxysmal atrial fibrillation Bradycardia currently on amiodarone. Anticoagulated with Eliquis. Apparently she did not do very well while in atrial fibrillation. Currently asymptomatic. My sedation for now will be to  try to continue with rhythm control. Provider she has no adverse events or outcomes from being on amiodarone at that this may be the best option. She did not do well on beta blocker therapy, though reasonably good she would not get bradycardic on calcium channel blocker as well.  Chronic anticoagulation- Eliquis No bleeding complications.  Chronic diastolic heart failure Diastolic dysfunction, and this did not tolerate being in atrial fibrillation. This is the main reason for keeping her out of A. fib on amiodarone. I will start her low dose of Lasix because she does have some mild edema of the head. Starting at 20 mg daily for the first few days and I will back off to every other day. She can do sliding scale to Lasix where she monitors her weight she gains more than 3 pounds from her baseline weight that she has recorded she will take daily Lasix until he gets down it goes up about 5 pounds she should take twice a day Lasix until the weight returns to normal. With her weight being up about 2.8 pounds from her baseline weight of Edison Nasuti proBNP distant see what her baseline is.  On  amiodarone therapy Now on chronic amiodarone. She needs baseline thyroid function tests liver function tests as well as annual eye exams. We'll also order pulmonary function tests.  Benign hypertension with chronic kidney disease, stage III Her blood pressure is a little bit today, but she is also a little bit volume up. She is on 5 amlodipine, which may not be the best option given her edema, with the renal insufficiency the ACE inhibitor/ARB, Mrs. were discontinued. She is currently on Imdur. She does need afterload reduction for her chronic diastolic dysfunction.  We will need to consider reinitiating ARB therapy now that her renal function is stable. I'll wait for least check, is currently she's doing fine symptomatically.    Orders Placed This Encounter  Procedures  . Comprehensive metabolic panel  . TSH  . EKG 12-Lead  . Pulmonary function test    Standing Status: Future     Number of Occurrences:      Standing Expiration Date: 12/24/2014    Scheduling Instructions:     Amiodarone drug use    Order Specific Question:  Where should this test be performed?    Answer:  [facility]    Order Specific Question:  Full PFT    Answer:  Yes    Order Specific Question:  Basic spirometry    Answer:  Yes    Order Specific Question:  Spirometry pre & post bronchodilator    Answer:  No    Order Specific Question:  ABG    Answer:  No    Order Specific Question:  Diffusion capacity (DLCO)    Answer:  Yes    Order Specific Question:  MIP/MEP    Answer:  No    Order Specific Question:  Lung volumes    Answer:  Yes    Order Specific Question:  Methacholine challenge    Answer:  No    Order Specific Question:  Exercise induced bronchospasm study (EIB)    Answer:  No    Order Specific Question:  Portable before and after    Answer:  No    Order Specific Question:  Portable simple    Answer:  No   Meds ordered this encounter  Medications  . furosemide (LASIX) 20 MG tablet    Sig: Take 1  tablet (20 mg total) by  mouth daily. or as directed    Dispense:  30 tablet    Refill:  11    Followup:  3 months  Aerica Rincon W. Ellyn Hack, M.D., M.S. Interventional Cardiology [facility]

## 2013-12-25 NOTE — Assessment & Plan Note (Signed)
Now on chronic amiodarone. She needs baseline thyroid function tests liver function tests as well as annual eye exams. We'll also order pulmonary function tests.

## 2013-12-25 NOTE — Assessment & Plan Note (Signed)
Bradycardia currently on amiodarone. Anticoagulated with Eliquis. Apparently she did not do very well while in atrial fibrillation. Currently asymptomatic. My sedation for now will be to try to continue with rhythm control. Provider she has no adverse events or outcomes from being on amiodarone at that this may be the best option. She did not do well on beta blocker therapy, though reasonably good she would not get bradycardic on calcium channel blocker as well.

## 2013-12-25 NOTE — Assessment & Plan Note (Signed)
No bleeding complications 

## 2013-12-27 LAB — COMPREHENSIVE METABOLIC PANEL
ALK PHOS: 71 U/L (ref 39–117)
ALT: 15 U/L (ref 0–35)
AST: 21 U/L (ref 0–37)
Albumin: 4.3 g/dL (ref 3.5–5.2)
BILIRUBIN TOTAL: 0.3 mg/dL (ref 0.2–1.2)
BUN: 38 mg/dL — ABNORMAL HIGH (ref 6–23)
CO2: 27 meq/L (ref 19–32)
CREATININE: 1.85 mg/dL — AB (ref 0.50–1.10)
Calcium: 9 mg/dL (ref 8.4–10.5)
Chloride: 101 mEq/L (ref 96–112)
Glucose, Bld: 102 mg/dL — ABNORMAL HIGH (ref 70–99)
Potassium: 4.4 mEq/L (ref 3.5–5.3)
SODIUM: 137 meq/L (ref 135–145)
TOTAL PROTEIN: 6.9 g/dL (ref 6.0–8.3)

## 2013-12-27 LAB — TSH: TSH: 1.297 u[IU]/mL (ref 0.350–4.500)

## 2013-12-29 ENCOUNTER — Telehealth: Payer: Self-pay | Admitting: *Deleted

## 2013-12-29 ENCOUNTER — Encounter: Payer: Self-pay | Admitting: *Deleted

## 2013-12-29 ENCOUNTER — Other Ambulatory Visit (HOSPITAL_BASED_OUTPATIENT_CLINIC_OR_DEPARTMENT_OTHER): Payer: Medicare Other | Admitting: Lab

## 2013-12-29 ENCOUNTER — Telehealth: Payer: Self-pay | Admitting: Hematology & Oncology

## 2013-12-29 ENCOUNTER — Ambulatory Visit: Payer: 59

## 2013-12-29 ENCOUNTER — Encounter: Payer: Self-pay | Admitting: Hematology & Oncology

## 2013-12-29 ENCOUNTER — Ambulatory Visit (HOSPITAL_BASED_OUTPATIENT_CLINIC_OR_DEPARTMENT_OTHER): Payer: Medicare Other | Admitting: Hematology & Oncology

## 2013-12-29 VITALS — BP 170/43 | HR 57 | Temp 97.7°F | Resp 14 | Ht 62.0 in | Wt 151.0 lb

## 2013-12-29 DIAGNOSIS — D649 Anemia, unspecified: Secondary | ICD-10-CM

## 2013-12-29 DIAGNOSIS — I10 Essential (primary) hypertension: Secondary | ICD-10-CM

## 2013-12-29 DIAGNOSIS — N183 Chronic kidney disease, stage 3 unspecified: Secondary | ICD-10-CM

## 2013-12-29 LAB — CBC WITH DIFFERENTIAL (CANCER CENTER ONLY)
BASO#: 0 10*3/uL (ref 0.0–0.2)
BASO%: 0.2 % (ref 0.0–2.0)
EOS%: 1.6 % (ref 0.0–7.0)
Eosinophils Absolute: 0.1 10*3/uL (ref 0.0–0.5)
HEMATOCRIT: 36.6 % (ref 34.8–46.6)
HGB: 11.6 g/dL (ref 11.6–15.9)
LYMPH#: 0.9 10*3/uL (ref 0.9–3.3)
LYMPH%: 21.6 % (ref 14.0–48.0)
MCH: 29.3 pg (ref 26.0–34.0)
MCHC: 31.7 g/dL — AB (ref 32.0–36.0)
MCV: 92 fL (ref 81–101)
MONO#: 0.5 10*3/uL (ref 0.1–0.9)
MONO%: 12.2 % (ref 0.0–13.0)
NEUT#: 2.7 10*3/uL (ref 1.5–6.5)
NEUT%: 64.4 % (ref 39.6–80.0)
Platelets: 211 10*3/uL (ref 145–400)
RBC: 3.96 10*6/uL (ref 3.70–5.32)
RDW: 16.8 % — AB (ref 11.1–15.7)
WBC: 4.3 10*3/uL (ref 3.9–10.0)

## 2013-12-29 LAB — CHCC SATELLITE - SMEAR

## 2013-12-29 NOTE — Telephone Encounter (Signed)
Pt was calling in regards to her Isosorbide 30 mg and Amlodipine 5 mg needing to refilled. She forgot to mention at her appointment on Friday  that it was an Rx from the hospital and he needed to fill it for her. She will be out of them tomorrow.  TK

## 2013-12-29 NOTE — Telephone Encounter (Signed)
Spoke w NEW PATIENT today to remind them of their appointment with Dr. Marin Olp. Also, advised them to bring all meds and insurance information. However, pt advised that her street was still covered with snow and she may cb to reschedule today.   She will let us know.

## 2013-12-29 NOTE — Telephone Encounter (Signed)
Message copied by Raiford Simmonds on Wed Dec 29, 2013  5:42 PM ------      Message from: Rudi Rummage      Created: Tue Dec 28, 2013  9:12 AM      Regarding: PFT Order       PFT for [name] has been scheduled at [facility] for February 23rd at 1:00pm with a 12:45pm arrival time.  Please instruct patient to enter hospital on [address] through main entrance A and to register with admitting.  If this appointment day/time does not work for patient, please call [phone] to reschedule.            Thank you.       ------

## 2013-12-29 NOTE — Telephone Encounter (Signed)
Returned call.  Left message to call back before 4pm and to leave name of pharmacy so scripts can be sent.

## 2013-12-29 NOTE — Telephone Encounter (Signed)
LEFT MESSAGE TO CALL BACK  LETTER MAILED FOR PFT APPOINTMNET

## 2013-12-30 LAB — IRON AND TIBC CHCC
%SAT: 25 % (ref 21–57)
Iron: 96 ug/dL (ref 41–142)
TIBC: 379 ug/dL (ref 236–444)
UIBC: 283 ug/dL (ref 120–384)

## 2013-12-30 LAB — FERRITIN CHCC: FERRITIN: 47 ng/mL (ref 9–269)

## 2013-12-30 MED ORDER — AMLODIPINE BESYLATE 5 MG PO TABS: 5.0000 mg | ORAL_TABLET | Freq: Every day | ORAL | Status: DC

## 2013-12-30 MED ORDER — ISOSORBIDE MONONITRATE ER 30 MG PO TB24: 30.0000 mg | ORAL_TABLET | Freq: Every day | ORAL | Status: DC

## 2013-12-30 NOTE — Telephone Encounter (Signed)
SPOKE TO PATIENT. IN FORMATION GIVEN - LABS AND PFT APPOINTMENT  PATIENT STATES SHE SAW DR ENNEVER AND HE THINKS THAT HER BLOOD COUNT IS A ISSUE AND HE MADE HER AN APPOINTMENT WITH A KIDNEY DOCTOR WHICH WILL BE IN @ 6 WEEKS.  PATIENT INQUIRED ABOUT MEDICATION BEING CALLED INTO  PHARMACY. RN INFORMED HER THAT ISOSORBIDE AND AMLODIPINE WILL BE E-SENT TO [facility]  WITH  #30 DAY SUPPLY x 11

## 2013-12-30 NOTE — Telephone Encounter (Signed)
Message copied by Raiford Simmonds on Thu Dec 30, 2013  8:52 AM ------      Message from: [name]      Created: Wed Dec 29, 2013  4:46 PM       Overall, the labs look pretty good. Renal function is stable, but not yet back to baseline. TSH and LFTs are normal.            [name], MD       ------

## 2013-12-31 ENCOUNTER — Other Ambulatory Visit: Payer: Self-pay | Admitting: Dermatology

## 2013-12-31 LAB — PROTEIN ELECTROPHORESIS, SERUM, WITH REFLEX
ALPHA-1-GLOBULIN: 4.6 % (ref 2.9–4.9)
Albumin ELP: 58.1 % (ref 55.8–66.1)
Alpha-2-Globulin: 10.9 % (ref 7.1–11.8)
BETA 2: 3.9 % (ref 3.2–6.5)
Beta Globulin: 7.3 % — ABNORMAL HIGH (ref 4.7–7.2)
Gamma Globulin: 15.2 % (ref 11.1–18.8)
Total Protein, Serum Electrophoresis: 7.1 g/dL (ref 6.0–8.3)

## 2013-12-31 LAB — COMPREHENSIVE METABOLIC PANEL
ALT: 17 U/L (ref 0–35)
AST: 22 U/L (ref 0–37)
Albumin: 4.5 g/dL (ref 3.5–5.2)
Alkaline Phosphatase: 71 U/L (ref 39–117)
BILIRUBIN TOTAL: 0.3 mg/dL (ref 0.2–1.2)
BUN: 48 mg/dL — AB (ref 6–23)
CALCIUM: 9.1 mg/dL (ref 8.4–10.5)
CO2: 23 mEq/L (ref 19–32)
Chloride: 99 mEq/L (ref 96–112)
Creatinine, Ser: 2.13 mg/dL — ABNORMAL HIGH (ref 0.50–1.10)
Glucose, Bld: 93 mg/dL (ref 70–99)
Potassium: 4.6 mEq/L (ref 3.5–5.3)
Sodium: 134 mEq/L — ABNORMAL LOW (ref 135–145)
Total Protein: 7.1 g/dL (ref 6.0–8.3)

## 2013-12-31 LAB — HAPTOGLOBIN: Haptoglobin: 144 mg/dL (ref 45–215)

## 2013-12-31 LAB — RETICULOCYTES (CHCC)
ABS Retic: 31.8 10*3/uL (ref 19.0–186.0)
RBC.: 3.97 MIL/uL (ref 3.87–5.11)
Retic Ct Pct: 0.8 % (ref 0.4–2.3)

## 2013-12-31 LAB — ERYTHROPOIETIN: Erythropoietin: 5.9 m[IU]/mL (ref 2.6–18.5)

## 2013-12-31 NOTE — Progress Notes (Signed)
Referral MD  Reason for Referral: anemia  Chief Complaint  Patient presents with  . NEW PATIENT   my doctor told me I had to see a blood doctor    HPI: Ms. Fricano is an 80 year old white female. She actually is the mother of a patient of mine who passed away 10 years ago from lung cancer.  I have seen her before 4 white cell issues.   Now she has some anemia. She feels well. She's not had any bleeding.  Her family doctor has been doing blood work on her. Going back to April of 2014, her hemoglobin was 10.5 and hematocrit 31.2. She had normal white cell count.   In December of 2014 she was admitted with heart failure. Her white cell count was 4.5. Hemoglobin 9.1. Hematocrit 28.4. Platelet count was 227.  She was transfused in the hospital. I think she got one unit of blood.she had some anemia studies done. She was iron deficient. Ferritin was 12 and iron saturation 7%. I think she is put on oral iron. She hadm her last colonoscopy back in June of 2013. Irregular to okay. She does have some diverticulosis.  Is feeling better. She's having no chest pain. There is no shortness of breath. She may fill week on occasion. She's had no fever. No weight loss or weight gain. Appetite is all right. No joint issues.  She was kindlyrefer to the [facility] for an evaluation.    Past Medical History  Diagnosis Date  . Hyperlipidemia   . Hypothyroidism   . Arthritis   . Anemia   . Asthma   . Anxiety   . IBS (irritable bowel syndrome)   . Gout   . Colon polyps   . Tubulovillous adenoma 4/07  . Hemorrhoids   . Diverticulosis   . Chronic diarrhea   . Chronic kidney disease stage III (GFR 30-59 ml/min)   . Hypertensive heart disease     no significant RAS by MRA 2002- (<30% LRAS)   . Paroxysmal atrial fibrillation 11/01/2013    Intermittent through the years and recurrent in December of 2014 treated with amiodarone;;   . Peripheral vascular disease     123456 LICA, [id number] RICA by  doppler 2009   . Benign positional vertigo   . LBBB (left bundle branch block)- new 11/01/13 11/01/2013  . Diastolic dysfunction, left ventricle 11/03/2013  :  Past Surgical History  Procedure Laterality Date  . Lumbar disc surgery  2000  . Squamous cell skin cancer removed    . Oophorectomy  1962    right  . Appendectomy    . Low anterior bowel resection  7/08  . Cataract extraction    . Transthoracic echocardiogram  11/03/2013    Echocardiogram: EF 55-60%, mild LVH, elevated bili pressures. Mild aortic valve calcification.  Marland Kitchen Nm myoview ltd  11/02/2013    Negative for ischemia or infarction. EF 60%.  :  Current outpatient prescriptions:amiodarone (PACERONE) 200 MG tablet, Take 200 mg by mouth daily., Disp: , Rfl: ;  apixaban (ELIQUIS) 2.5 MG TABS tablet, Take 1 tablet (2.5 mg total) by mouth 2 (two) times daily., Disp: 60 tablet, Rfl: 6;  Cyanocobalamin (VITAMIN B 12 PO), Take 1,000 mcg by mouth every morning., Disp: , Rfl: ;  furosemide (LASIX) 20 MG tablet, Take 1 tablet (20 mg total) by mouth daily. or as directed, Disp: 30 tablet, Rfl: 11 hyoscyamine (LEVSIN, ANASPAZ) 0.125 MG tablet, Take 1-2 tablets by mouth or under tongue every 4  hours as needed before meals, Disp: 360 tablet, Rfl: 5;  levothyroxine (SYNTHROID, LEVOTHROID) 88 MCG tablet, Take 88 mcg by mouth daily before breakfast., Disp: , Rfl: ;  temazepam (RESTORIL) 30 MG capsule, Take 30 mg by mouth at bedtime as needed for sleep (restless leg)., Disp: , Rfl:  amLODipine (NORVASC) 5 MG tablet, Take 1 tablet (5 mg total) by mouth daily., Disp: 30 tablet, Rfl: 11;  isosorbide mononitrate (IMDUR) 30 MG 24 hr tablet, Take 1 tablet (30 mg total) by mouth daily., Disp: 30 tablet, Rfl: 11:  :  Allergies  Allergen Reactions  . Amoxicillin     REACTION: unspecified  . Codeine Phosphate     REACTION: unspecified  . Lipitor [Atorvastatin] Other (See Comments)    "makes legs jump all night"  . Penicillins     REACTION: nausea,  swelling  . Xifaxan [Rifaximin] Other (See Comments)    unknown  :  Family History  Problem Relation Age of Onset  . Cancer Mother 35  . Heart attack Father   . Heart disease Brother   . Stroke Maternal Grandmother   . Cancer Maternal Grandfather     Lung cancer  . Stroke Paternal Grandmother   . Cancer Daughter   :  History   Social History  . Marital Status: Widowed    Spouse Name: N/A    Number of Children: N/A  . Years of Education: N/A   Occupational History  . Not on file.   Social History Main Topics  . Smoking status: Never Smoker   . Smokeless tobacco: Never Used     Comment: never used product  . Alcohol Use: No  . Drug Use: No  . Sexual Activity: No   Other Topics Concern  . Not on file   Social History Narrative   She is a widowed mother of 2, one child is deceased. His grandmother of one. She appears to be working out routinely at [employer] using [employer]. She is otherwise quite active.   She does not drink alcohol, does not smoke.  :  Pertinent items are noted in HPI.  Exam: @IPVITALS @ Elderly white female in no obvious distress. Vital signs show temperature 97.7. Pulse 57. Blood pressure 155/43. Weight 151. Head and neck exam no ocular or oral lesions. She is temporal muscle wasting. There is no scleral icterus. Conjunctiva is pink. No adenopathy noted in the neck. Lungs are clear. Cardiac exam regular rate rhythm with an occasional extra beat.abdomen is soft. There is no fluid wave. There is no palpable hepato- or splenomegaly. Back exam shows kyphosis. Extremities shows muscle atrophy that is age related. There is osteoarthritic changes. Skin exam some scattered ecchymoses. Neurological exam no focal neurological deficits.   Recent Labs  12/29/13 1407  WBC 4.3  HGB 11.6  HCT 36.6  PLT 211    Recent Labs  12/29/13 1407  NA 134*  K 4.6  CL 99  CO2 23  GLUCOSE 93  BUN 48*  CREATININE 2.13*  CALCIUM 9.1    Blood smear  review: normochromic normocytic red blood cells. No nucleated red blood cells. No teardrop cells. No hypersegmented polys. No immature myeloid cells. Normal platelets. No atypical lymphocytes.  Pathology:her BUN and creatinine are 48 and 2.13. Total protein 7.1. Ferritin 47 with an iron saturation of 25%. There is no M spike. The reticulocyte count is 0.8. Haptoglobin 144. Erythropoietin 5.9.    Assessment and Plan: Ms. Heckendorf is a 80 year old white female.  She has mild anemia. Is not bad at all today. She must have gone iron at some point. Prior studies are better.  Is no surprise that she has a low erythropoietin level. At some point, she may need Aranesp.  She does not need a bone marrow test. I don't see anything on her blood smear or on her physical exam that would be a problem.  At 80 years old, she is doing very well. We talked a lot about her daughter.  I want to see her back in a couple months.  I spent a good 45 minutes with her.

## 2014-01-03 ENCOUNTER — Ambulatory Visit (HOSPITAL_COMMUNITY)
Admission: RE | Admit: 2014-01-03 | Discharge: 2014-01-03 | Disposition: A | Discharge status: Home / Self Care | Payer: Medicare Other | Source: Ambulatory Visit | Attending: Cardiology | Admitting: Cardiology

## 2014-01-03 DIAGNOSIS — I4891 Unspecified atrial fibrillation: Secondary | ICD-10-CM | POA: Insufficient documentation

## 2014-01-03 DIAGNOSIS — Z79899 Other long term (current) drug therapy: Secondary | ICD-10-CM | POA: Insufficient documentation

## 2014-01-03 MED ORDER — ALBUTEROL SULFATE (2.5 MG/3ML) 0.083% IN NEBU
2.5000 mg | INHALATION_SOLUTION | Freq: Once | RESPIRATORY_TRACT | Status: AC
  Administered 2014-01-03: 2.5 mg via RESPIRATORY_TRACT

## 2014-01-12 ENCOUNTER — Telehealth: Payer: Self-pay | Admitting: Pharmacist Clinician (PhC)/ Clinical Pharmacy Specialist

## 2014-01-12 NOTE — Telephone Encounter (Signed)
PA sent on 3/3 for Eliquis 2.5mg , approved on 3/4 for 1 year  [employer]

## 2014-01-16 LAB — PULMONARY FUNCTION TEST
DL/VA % PRED: 85 %
DL/VA: 4.02 ml/min/mmHg/L
DLCO cor % pred: 63 %
DLCO cor: 14.46 ml/min/mmHg
DLCO unc % pred: 63 %
DLCO unc: 14.46 ml/min/mmHg
FEF 25-75 Post: 1.24 L/sec
FEF 25-75 Pre: 1.12 L/sec
FEF2575-%CHANGE-POST: 10 %
FEF2575-%PRED-POST: 127 %
FEF2575-%Pred-Pre: 115 %
FEV1-%CHANGE-POST: 3 %
FEV1-%PRED-POST: 101 %
FEV1-%PRED-PRE: 97 %
FEV1-POST: 1.6 L
FEV1-PRE: 1.54 L
FEV1FVC-%Change-Post: 0 %
FEV1FVC-%PRED-PRE: 102 %
FEV6-%Change-Post: 5 %
FEV6-%Pred-Post: 109 %
FEV6-%Pred-Pre: 103 %
FEV6-POST: 2.19 L
FEV6-Pre: 2.07 L
FEV6FVC-%Change-Post: 0 %
FEV6FVC-%Pred-Post: 107 %
FEV6FVC-%Pred-Pre: 107 %
FVC-%Change-Post: 4 %
FVC-%PRED-PRE: 97 %
FVC-%Pred-Post: 101 %
FVC-Post: 2.2 L
FVC-Pre: 2.1 L
POST FEV1/FVC RATIO: 73 %
PRE FEV6/FVC RATIO: 100 %
Post FEV6/FVC ratio: 100 %
Pre FEV1/FVC ratio: 73 %
RV % PRED: 102 %
RV: 2.56 L
TLC % pred: 98 %
TLC: 4.83 L

## 2014-01-19 ENCOUNTER — Telehealth: Payer: Self-pay | Admitting: *Deleted

## 2014-01-19 NOTE — Telephone Encounter (Signed)
Message copied by Raiford Simmonds on Wed Jan 19, 2014  9:38 AM ------      Message from: Ellyn Hack, DAVID W      Created: Wed Jan 19, 2014  1:53 AM       PFT results            Results suggest possible mild obstructive lung disease, no significant change in the diffusing capacity which will be looked at with amiodarone.            Will follow back up in one year.            [name], MD       ------

## 2014-01-19 NOTE — Telephone Encounter (Signed)
Phone busy-will try again

## 2014-01-19 NOTE — Progress Notes (Signed)
Quick Note:  PFT results  Results suggest possible mild obstructive lung disease, no significant change in the diffusing capacity which will be looked at with amiodarone.  Will follow back up in one year.  [name], MD  ______

## 2014-01-19 NOTE — Telephone Encounter (Signed)
No answer left message to call back.

## 2014-01-20 ENCOUNTER — Other Ambulatory Visit: Payer: Self-pay | Admitting: *Deleted

## 2014-01-20 DIAGNOSIS — Z79899 Other long term (current) drug therapy: Secondary | ICD-10-CM

## 2014-01-20 NOTE — Telephone Encounter (Signed)
Spoke to patient. PFT Result given . Verbalized understanding

## 2014-01-20 NOTE — Telephone Encounter (Signed)
Returning your call from yesterday. °

## 2014-02-07 ENCOUNTER — Telehealth: Payer: Self-pay | Admitting: Family Medicine

## 2014-02-07 NOTE — Telephone Encounter (Signed)
Pt states that she has never heard anything from [facility] and its been over 2 months. States she has called them several times and no one will return her call. Pt states she still has a lot of swelling in her legs. Wants to know if dr. Sarajane Jews can refer her to someone else.

## 2014-02-09 ENCOUNTER — Ambulatory Visit (INDEPENDENT_AMBULATORY_CARE_PROVIDER_SITE_OTHER): Payer: Medicare Other | Admitting: Cardiology

## 2014-02-09 ENCOUNTER — Telehealth: Payer: Self-pay | Admitting: Cardiology

## 2014-02-09 ENCOUNTER — Encounter: Payer: Self-pay | Admitting: Cardiology

## 2014-02-09 ENCOUNTER — Telehealth: Payer: Self-pay

## 2014-02-09 VITALS — BP 170/64 | HR 56 | Ht 64.0 in | Wt 151.5 lb

## 2014-02-09 DIAGNOSIS — R609 Edema, unspecified: Secondary | ICD-10-CM

## 2014-02-09 DIAGNOSIS — I119 Hypertensive heart disease without heart failure: Secondary | ICD-10-CM

## 2014-02-09 DIAGNOSIS — I5032 Chronic diastolic (congestive) heart failure: Secondary | ICD-10-CM

## 2014-02-09 DIAGNOSIS — I10 Essential (primary) hypertension: Secondary | ICD-10-CM

## 2014-02-09 DIAGNOSIS — R600 Localized edema: Secondary | ICD-10-CM

## 2014-02-09 DIAGNOSIS — N183 Chronic kidney disease, stage 3 unspecified: Secondary | ICD-10-CM

## 2014-02-09 DIAGNOSIS — I4891 Unspecified atrial fibrillation: Secondary | ICD-10-CM

## 2014-02-09 DIAGNOSIS — I480 Paroxysmal atrial fibrillation: Secondary | ICD-10-CM

## 2014-02-09 MED ORDER — FUROSEMIDE 40 MG PO TABS: 40.0000 mg | ORAL_TABLET | Freq: Every day | ORAL | Status: DC

## 2014-02-09 MED ORDER — HYDRALAZINE HCL 25 MG PO TABS: 25.0000 mg | ORAL_TABLET | Freq: Two times a day (BID) | ORAL | Status: DC

## 2014-02-09 NOTE — Assessment & Plan Note (Signed)
Has an appointment in 2 mos

## 2014-02-09 NOTE — Patient Instructions (Addendum)
Increase Furosemide to 40 mg daily. Stop Norvasc. Start Hydralazine 25 mg twice a day. Have BMP drawn in two weeks.  Follow up in 2 weeks with Lurena Joiner

## 2014-02-09 NOTE — Assessment & Plan Note (Signed)
Pt is seen as an add on for LE edema

## 2014-02-09 NOTE — Telephone Encounter (Signed)
Appt added on by [facility] in Scheduling.

## 2014-02-09 NOTE — Telephone Encounter (Signed)
In the last 2 weeks she have gained 10lbs and her legs are so swollen. The fluid pills are not helping,she feels it in her chest now.

## 2014-02-09 NOTE — Telephone Encounter (Signed)
Left a message for return call.  

## 2014-02-09 NOTE — Assessment & Plan Note (Signed)
Intermittent through the years and recurrent in December of 2014 treated with amiodarone

## 2014-02-09 NOTE — Telephone Encounter (Signed)
Called [facility] and spoke with Shiloh.  She states that she never received a phone call today about pt's referral.  Suanne Marker did verify that pt's referrral was received on 12/13/13.  Patient was rated at a 4 so it will be a little longer before she is set up for an appointment.  If pt's lab values have gotten worse then those records can be faxed to [facility] and one of the physicians will reasess pt to see if she needs to be seen sooner.

## 2014-02-09 NOTE — Assessment & Plan Note (Signed)
Her wgt is up 2 lbs but she denies SOB or orthopnea.

## 2014-02-09 NOTE — Telephone Encounter (Signed)
I referred her to [facility] on 12-13-13 and nothing has happened. Can you look into this please?

## 2014-02-09 NOTE — Progress Notes (Signed)
02/09/2014 [name]   12-08-1923  [id number]  Primary Physician [name], MD Primary Cardiologist: [name]  HPI:  Pleasant 80 y/o female with a remote history of PAF. She had been in NSR for years. We saw he in the ER at [facility] 11/01/13 when she presented with multiple somatic complaints and was noted to be in AF with a new LBBB. Beta blocker was added. A Myoview was low risk. Echo showed good LVF with RAE, LVH,LAE, and PA pressures of 47 mmHg. TSH, LFTs, electrolytes were WNL. She was noted to be anemic. She had seen Dr Sonny Dandy, then Dr Marin Olp for this in the past. She had prior GI evaluation in June 2013. She is iron deficient but has had an intolerance to po Iron.            She was just seen 12/01/13 and was noted to be bradycardic and SOB. We stopped her beta blocker and decreased her Amiodarone. She ended up at [facility] 12/03/13 with CHF. Her wgt was 160. She diuresed quickly but then had a jump in her SCr and her ARB and diuretics were held.  She had an appointment in follow up and was doing well.            Today she called complaining of edema. She ednies orthopnea or PND.    Current Outpatient Prescriptions  Medication Sig Dispense Refill  . amiodarone (PACERONE) 200 MG tablet Take 200 mg by mouth daily.      Marland Kitchen apixaban (ELIQUIS) 2.5 MG TABS tablet Take 1 tablet (2.5 mg total) by mouth 2 (two) times daily.  60 tablet  6  . Cyanocobalamin (VITAMIN B 12 PO) Take 1,000 mcg by mouth every morning.      . hyoscyamine (LEVSIN, ANASPAZ) 0.125 MG tablet Take 1-2 tablets by mouth or under tongue every 4 hours as needed before meals  360 tablet  5  . isosorbide mononitrate (IMDUR) 30 MG 24 hr tablet Take 1 tablet (30 mg total) by mouth daily.  30 tablet  11  . levothyroxine (SYNTHROID, LEVOTHROID) 88 MCG tablet Take 88 mcg by mouth daily before breakfast.      . temazepam (RESTORIL) 30 MG capsule Take 30 mg by mouth at bedtime as needed for sleep (restless leg).      . furosemide (LASIX)  40 MG tablet Take 1 tablet (40 mg total) by mouth daily.  90 tablet  3  . hydrALAZINE (APRESOLINE) 25 MG tablet Take 1 tablet (25 mg total) by mouth 2 (two) times daily.  270 tablet  3   No current facility-administered medications for this visit.    Allergies  Allergen Reactions  . Amoxicillin     REACTION: unspecified  . Codeine Phosphate     REACTION: unspecified  . Lipitor [Atorvastatin] Other (See Comments)    "makes legs jump all night"  . Penicillins     REACTION: nausea, swelling  . Xifaxan [Rifaximin] Other (See Comments)    unknown    History   Social History  . Marital Status: Widowed    Spouse Name: N/A    Number of Children: N/A  . Years of Education: N/A   Occupational History  . Not on file.   Social History Main Topics  . Smoking status: Never Smoker   . Smokeless tobacco: Never Used     Comment: never used product  . Alcohol Use: No  . Drug Use: No  . Sexual Activity: No  Other Topics Concern  . Not on file   Social History Narrative   She is a widowed mother of 2, one child is deceased. His grandmother of one. She appears to be working out routinely at [employer] using [employer]. She is otherwise quite active.   She does not drink alcohol, does not smoke.     Review of Systems: General: negative for chills, fever, night sweats or weight changes.  Cardiovascular: negative for chest pain, dyspnea on exertion, edema, orthopnea, palpitations, paroxysmal nocturnal dyspnea or shortness of breath Dermatological: negative for rash Respiratory: negative for cough or wheezing Urologic: negative for hematuria Abdominal: negative for nausea, vomiting, diarrhea, bright red blood per rectum, melena, or hematemesis Neurologic: negative for visual changes, syncope, or dizziness All other systems reviewed and are otherwise negative except as noted above.    Blood pressure 170/64, pulse 56, height 5\' 4"  (1.626 m), weight 151 lb 8 oz (68.72 kg).    General appearance: alert, cooperative and no distress Lungs: clear to auscultation bilaterally Heart: regular rate and rhythm Extremities: 1-2_ bilat LE edema   ASSESSMENT AND PLAN:   Edema of both legs Pt is seen as an add on for LE edema  Chronic diastolic heart failure Her wgt is up 2 lbs but she denies SOB or orthopnea.  Chronic kidney disease stage III (GFR 30-59 ml/min) Has an appointment in 2 mos  Paroxysmal atrial fibrillation Intermittent through the years and recurrent in December of 2014 treated with amiodarone  Hypertensive heart disease EF 55-60% with LVH Dec 2014   PLAN  Increase Lasix to 40 mg daily, start Hydralazine at 25 mg BID, stop Norvasc. Follow up BMP and OV 2 weeks.   Kenly Xiao KPA-C 02/09/2014 1:18 PM

## 2014-02-09 NOTE — Telephone Encounter (Signed)
Returned call and pt verified x 2 w/ Butch Penny, pt's daughter-in-law.  Stated pt's weight was 142 lbs and is up to 150 lbs today.  Stated pt c/o BLE swelling and "feeling it in her chest."  Stated she is trying to keep from going back to the hospital and that's why she is calling.  Also stated pt was supposed to be seen by a kidney doctor 2 months ago and she cannot get an appt w/ the doctor she was referred to.  Wanted to know if pt could be referred to another kidney doctor.  Denied pt in any acute distress at this point and pt does not want to go to hospital.  Informed RN will notify provider and call her back.  Verbalized understanding and agreed w/ plan.  Kerin Ransom, PA-C notified and advised pt be added on to be seen today at 11:40am.    Call to [facility] and informed.  Agreed w/ plan.

## 2014-02-09 NOTE — Assessment & Plan Note (Signed)
EF 55-60% with LVH Dec 2014

## 2014-02-10 NOTE — Telephone Encounter (Signed)
Called and spoke with pt and pt is aware of update on referral to [facility].  Pt was seen by Dr. Baird [facility] office as well.  Pt states her bp medication could have been causing her feet and legs to swell.  Pt felt a little better about waiting for appt at [facility].

## 2014-02-21 ENCOUNTER — Other Ambulatory Visit: Payer: Self-pay | Admitting: Cardiology

## 2014-02-21 LAB — BASIC METABOLIC PANEL
BUN: 61 mg/dL — ABNORMAL HIGH (ref 6–23)
CO2: 28 mEq/L (ref 19–32)
Calcium: 9.6 mg/dL (ref 8.4–10.5)
Chloride: 99 mEq/L (ref 96–112)
Creat: 2.34 mg/dL — ABNORMAL HIGH (ref 0.50–1.10)
Glucose, Bld: 93 mg/dL (ref 70–99)
Potassium: 3.9 mEq/L (ref 3.5–5.3)
Sodium: 139 mEq/L (ref 135–145)

## 2014-02-23 ENCOUNTER — Encounter: Payer: Self-pay | Admitting: Cardiology

## 2014-02-23 ENCOUNTER — Ambulatory Visit (INDEPENDENT_AMBULATORY_CARE_PROVIDER_SITE_OTHER): Payer: Medicare Other | Admitting: Cardiology

## 2014-02-23 VITALS — BP 168/60 | HR 56 | Ht 64.0 in | Wt 143.0 lb

## 2014-02-23 DIAGNOSIS — N183 Chronic kidney disease, stage 3 unspecified: Secondary | ICD-10-CM

## 2014-02-23 DIAGNOSIS — I4891 Unspecified atrial fibrillation: Secondary | ICD-10-CM

## 2014-02-23 DIAGNOSIS — N189 Chronic kidney disease, unspecified: Secondary | ICD-10-CM

## 2014-02-23 DIAGNOSIS — I119 Hypertensive heart disease without heart failure: Secondary | ICD-10-CM

## 2014-02-23 DIAGNOSIS — I509 Heart failure, unspecified: Secondary | ICD-10-CM

## 2014-02-23 DIAGNOSIS — I5033 Acute on chronic diastolic (congestive) heart failure: Secondary | ICD-10-CM

## 2014-02-23 DIAGNOSIS — I480 Paroxysmal atrial fibrillation: Secondary | ICD-10-CM

## 2014-02-23 MED ORDER — FUROSEMIDE 40 MG PO TABS: ORAL_TABLET | ORAL | Status: DC

## 2014-02-23 NOTE — Assessment & Plan Note (Signed)
BP better 

## 2014-02-23 NOTE — Patient Instructions (Signed)
Change Lasix to 40 mg MWF, 20 mg T,Th, Sat, Sun Your physician recommends that you return for lab work in: 2 weeks.  Keep apt with [name] 5/14

## 2014-02-23 NOTE — Assessment & Plan Note (Signed)
SCr up to 2.4

## 2014-02-23 NOTE — Progress Notes (Signed)
02/23/2014 [name]   06-25-1924  [id number]  Primary Physicia [name], MD Primary Cardiologist: [name]  HPI:  Pleasant 80 y/o female we follow with PAF, CRI, and HTN with HTN CVD. She recently complained of increasing edema and dyspnea. Her Lasix was increased to 40 mg daily. She is here today for follow up. She feels better, less SOB, less edema. Her wgt is down to 142. SCr drawn on 4/13 was up to 2.4 though.    Current Outpatient Prescriptions  Medication Sig Dispense Refill  . amiodarone (PACERONE) 200 MG tablet Take 200 mg by mouth daily.      Marland Kitchen apixaban (ELIQUIS) 2.5 MG TABS tablet Take 1 tablet (2.5 mg total) by mouth 2 (two) times daily.  60 tablet  6  . Cyanocobalamin (VITAMIN B 12 PO) Take 1,000 mcg by mouth every morning.      . furosemide (LASIX) 40 MG tablet Take 40 mg MWF, 20 mg T,Th,Sat, Sun  90 tablet  3  . hydrALAZINE (APRESOLINE) 25 MG tablet Take 1 tablet (25 mg total) by mouth 2 (two) times daily.  270 tablet  3  . hyoscyamine (LEVSIN, ANASPAZ) 0.125 MG tablet Take 1-2 tablets by mouth or under tongue every 4 hours as needed before meals  360 tablet  5  . isosorbide mononitrate (IMDUR) 30 MG 24 hr tablet Take 1 tablet (30 mg total) by mouth daily.  30 tablet  11  . levothyroxine (SYNTHROID, LEVOTHROID) 88 MCG tablet Take 88 mcg by mouth daily before breakfast.      . temazepam (RESTORIL) 30 MG capsule Take 30 mg by mouth at bedtime as needed for sleep (restless leg).       No current facility-administered medications for this visit.    Allergies  Allergen Reactions  . Amoxicillin     REACTION: unspecified  . Codeine Phosphate     REACTION: unspecified  . Lipitor [Atorvastatin] Other (See Comments)    "makes legs jump all night"  . Penicillins     REACTION: nausea, swelling  . Xifaxan [Rifaximin] Other (See Comments)    unknown    History   Social History  . Marital Status: Widowed    Spouse Name: N/A    Number of Children: N/A  .  Years of Education: N/A   Occupational History  . Not on file.   Social History Main Topics  . Smoking status: Never Smoker   . Smokeless tobacco: Never Used     Comment: never used product  . Alcohol Use: No  . Drug Use: No  . Sexual Activity: No   Other Topics Concern  . Not on file   Social History Narrative   She is a widowed mother of 2, one child is deceased. His grandmother of one. She appears to be working out routinely at [employer] using [employer]. She is otherwise quite active.   She does not drink alcohol, does not smoke.     Review of Systems: General: negative for chills, fever, night sweats or weight changes.  Cardiovascular: negative for chest pain, dyspnea on exertion, edema, orthopnea, palpitations, paroxysmal nocturnal dyspnea or shortness of breath Dermatological: negative for rash Respiratory: negative for cough or wheezing Urologic: negative for hematuria Abdominal: negative for nausea, vomiting, diarrhea, bright red blood per rectum, melena, or hematemesis Neurologic: negative for visual changes, syncope, or dizziness All other systems reviewed and are otherwise negative except as noted above.    Blood pressure 168/60,  pulse 56, height 5\' 4"  (1.626 m), weight 143 lb (64.864 kg).  General appearance: alert, cooperative and no distress Lungs: clear to auscultation bilaterally Heart: regular rate and rhythm Extremities: trace edema, chronic skin changes    ASSESSMENT AND PLAN:   Acute on chronic diastolic congestive heart failure She reports she feels better, less SOB, less edema since Lasix was increased  Chronic kidney disease stage III (GFR 30-59 ml/min) SCr up to 2.4  Paroxysmal atrial fibrillation Holding NSR  Hypertensive heart disease B/P better   PLAN  I suggested she cut her Lasix back to 40 mg MWF- T/Th/Sat/Sun. Check BMP 2 weeks, keep apt with [name] in May.  Doreene Burke [facility] 02/23/2014 12:23 PM

## 2014-02-23 NOTE — Assessment & Plan Note (Signed)
Holding NSR 

## 2014-02-23 NOTE — Assessment & Plan Note (Signed)
She reports she feels better, less SOB, less edema since Lasix was increased

## 2014-02-24 ENCOUNTER — Telehealth: Payer: Self-pay | Admitting: Cardiology

## 2014-02-24 NOTE — Telephone Encounter (Signed)
Returned a call to patient in reference to her labs. She wanted to know if she had a CBC done. I informed her that she only had a B-MET.

## 2014-02-24 NOTE — Telephone Encounter (Signed)
She had lab work on [facility] to know what her blood count was? She forgot to ask you yesterday when she was here.If not at home please call [phone].

## 2014-03-02 ENCOUNTER — Encounter: Payer: Self-pay | Admitting: Hematology & Oncology

## 2014-03-02 ENCOUNTER — Ambulatory Visit: Payer: Medicare Other | Admitting: Pharmacist Clinician (PhC)/ Clinical Pharmacy Specialist

## 2014-03-02 ENCOUNTER — Ambulatory Visit (HOSPITAL_BASED_OUTPATIENT_CLINIC_OR_DEPARTMENT_OTHER): Payer: Medicare Other | Admitting: Hematology & Oncology

## 2014-03-02 ENCOUNTER — Other Ambulatory Visit (HOSPITAL_BASED_OUTPATIENT_CLINIC_OR_DEPARTMENT_OTHER): Payer: Medicare Other | Admitting: Lab

## 2014-03-02 VITALS — BP 161/46 | HR 50 | Temp 97.4°F | Resp 14 | Ht 64.0 in | Wt 141.0 lb

## 2014-03-02 DIAGNOSIS — N183 Chronic kidney disease, stage 3 unspecified: Secondary | ICD-10-CM

## 2014-03-02 DIAGNOSIS — D631 Anemia in chronic kidney disease: Secondary | ICD-10-CM

## 2014-03-02 DIAGNOSIS — N039 Chronic nephritic syndrome with unspecified morphologic changes: Secondary | ICD-10-CM

## 2014-03-02 DIAGNOSIS — D649 Anemia, unspecified: Secondary | ICD-10-CM

## 2014-03-02 DIAGNOSIS — L989 Disorder of the skin and subcutaneous tissue, unspecified: Secondary | ICD-10-CM

## 2014-03-02 LAB — CBC WITH DIFFERENTIAL (CANCER CENTER ONLY)
BASO#: 0 10*3/uL (ref 0.0–0.2)
BASO%: 0.5 % (ref 0.0–2.0)
EOS ABS: 0 10*3/uL (ref 0.0–0.5)
EOS%: 1 % (ref 0.0–7.0)
HEMATOCRIT: 35.9 % (ref 34.8–46.6)
HEMOGLOBIN: 11.9 g/dL (ref 11.6–15.9)
LYMPH#: 0.8 10*3/uL — ABNORMAL LOW (ref 0.9–3.3)
LYMPH%: 20.8 % (ref 14.0–48.0)
MCH: 30.7 pg (ref 26.0–34.0)
MCHC: 33.1 g/dL (ref 32.0–36.0)
MCV: 93 fL (ref 81–101)
MONO#: 0.6 10*3/uL (ref 0.1–0.9)
MONO%: 14.5 % — AB (ref 0.0–13.0)
NEUT#: 2.4 10*3/uL (ref 1.5–6.5)
NEUT%: 63.2 % (ref 39.6–80.0)
PLATELETS: 203 10*3/uL (ref 145–400)
RBC: 3.87 10*6/uL (ref 3.70–5.32)
RDW: 15.1 % (ref 11.1–15.7)
WBC: 3.9 10*3/uL (ref 3.9–10.0)

## 2014-03-02 LAB — RETICULOCYTES (CHCC)
ABS RETIC: 35.2 10*3/uL (ref 19.0–186.0)
RBC.: 3.91 MIL/uL (ref 3.87–5.11)
RETIC CT PCT: 0.9 % (ref 0.4–2.3)

## 2014-03-02 LAB — CHCC SATELLITE - SMEAR

## 2014-03-02 LAB — IRON AND TIBC CHCC
%SAT: 27 % (ref 21–57)
Iron: 90 ug/dL (ref 41–142)
TIBC: 333 ug/dL (ref 236–444)
UIBC: 242 ug/dL (ref 120–384)

## 2014-03-02 LAB — FERRITIN CHCC: FERRITIN: 37 ng/mL (ref 9–269)

## 2014-03-02 NOTE — Progress Notes (Signed)
Hematology and Oncology Follow Up Visit  [name] [id number] Aug 17, 1924 80 y.o. 03/02/2014   Principle Diagnosis:   Anemia secondary to renal insufficiency  Current Therapy:    Observation     Interim History:  Ms.  Scott is back for her second office visit. I first saw her back in February. At that point in time, or anemia studies showed that she had marked erythropoietin deficiency. Her erythropoietin level was only 5.9. She had a normal haptoglobin. She had a negative monoclonal spike. She had normal iron studies. Her reticulocyte count was about 0.5. I felt that everything was very consistent with renal insufficiency.  She feels okay. She's had no bleeding. He's had no nausea vomiting. She still is not noticing a kidney doctor.  She's had a decent appetite. She's had some weight loss. She's lost 10 pounds his last saw her.  She has seen her dermatologist. She had some lesions removed from her skin. Looks like she has a new one down at the bottom of her left leg close to the ankle.  Medications: Current outpatient prescriptions:amiodarone (PACERONE) 200 MG tablet, Take 200 mg by mouth daily., Disp: , Rfl: ;  apixaban (ELIQUIS) 2.5 MG TABS tablet, Take 1 tablet (2.5 mg total) by mouth 2 (two) times daily., Disp: 60 tablet, Rfl: 6;  Cyanocobalamin (VITAMIN B 12 PO), Take 1,000 mcg by mouth every morning., Disp: , Rfl: ;  furosemide (LASIX) 40 MG tablet, Take 40 mg MWF, 20 mg T,Th,Sat, Sun, Disp: 90 tablet, Rfl: 3 hydrALAZINE (APRESOLINE) 25 MG tablet, Take 1 tablet (25 mg total) by mouth 2 (two) times daily., Disp: 270 tablet, Rfl: 3;  hyoscyamine (LEVSIN, ANASPAZ) 0.125 MG tablet, Take 1-2 tablets by mouth or under tongue every 4 hours as needed before meals, Disp: 360 tablet, Rfl: 5;  isosorbide mononitrate (IMDUR) 30 MG 24 hr tablet, Take 1 tablet (30 mg total) by mouth daily., Disp: 30 tablet, Rfl: 11 levothyroxine (SYNTHROID, LEVOTHROID) 88 MCG tablet, Take 88 mcg by mouth daily  before breakfast., Disp: , Rfl: ;  temazepam (RESTORIL) 30 MG capsule, Take 30 mg by mouth at bedtime as needed for sleep (restless leg)., Disp: , Rfl:   Allergies:  Allergies  Allergen Reactions  . Amoxicillin     REACTION: unspecified  . Codeine Phosphate     REACTION: unspecified  . Lipitor [Atorvastatin] Other (See Comments)    "makes legs jump all night"  . Penicillins     REACTION: nausea, swelling  . Xifaxan [Rifaximin] Other (See Comments)    unknown    Past Medical History, Surgical history, Social history, and Family History were reviewed and updated.  Review of Systems: As above  Physical Exam:  height is $RemoveB'5\' 4"'[id number]$  (1.626 m) and weight is 141 lb (63.957 kg). Her oral temperature is 97.4 F (36.3 C). Her blood pressure is 161/46 and her pulse is 50. Her respiration is 14.   Elderly, thin white female. Her head and neck exam shows no ocular or oral lesions. There are no palpable cervical or supra-clavicular lymph nodes. Lungs are clear. Cardiac exam regular in rhythm. There are no murmurs rubs or bruits. Abdomen is soft. She has no fluid wave. No palpable liver or spleen tip. Back exam is a slight kyphosis. Extremities shows no clubbing cyanosis or edema. Skin exam does show a numerous actinic keratoses. There is a suspicious lesion at the bottom of her left leg over the tibial area. This is close to the ankle joint. It  is a 1 cm firm, nodular lesion. Neurological exam is nonfocal.  Lab Results  Component Value Date   WBC 3.9 03/02/2014   HGB 11.9 03/02/2014   HCT 35.9 03/02/2014   MCV 93 03/02/2014   PLT 203 03/02/2014     Chemistry      Component Value Date/Time   NA 139 02/21/2014 1116   K 3.9 02/21/2014 1116   CL 99 02/21/2014 1116   CO2 28 02/21/2014 1116   BUN 61* 02/21/2014 1116   CREATININE 2.34* 02/21/2014 1116   CREATININE 2.13* 12/29/2013 1407      Component Value Date/Time   CALCIUM 9.6 02/21/2014 1116   ALKPHOS 71 12/29/2013 1407   AST 22 12/29/2013 1407   ALT 17  12/29/2013 1407   BILITOT 0.3 12/29/2013 1407         Impression and Plan: [name] is 80 year old female. She is mildly anemic. Today she's not even anemic. Again she actually has very little erythropoietin. This, I believe, is her problem. I don't believe that she has an underlying marrow issue. I don't see that she needs a bone marrow biopsy.  Since she is doing okay, I just don't think that we have to get her back in the office. I just don't think we will be making any improvement in her medical care overall.  Hopefully, she will get in with one of the nephrologists.  I will be more than happy to see her again.   [name], MD 4/22/20156:32 PM

## 2014-03-09 ENCOUNTER — Other Ambulatory Visit: Payer: Self-pay | Admitting: Dermatology

## 2014-03-21 ENCOUNTER — Telehealth: Payer: Self-pay | Admitting: Cardiology

## 2014-03-21 NOTE — Telephone Encounter (Signed)
She should be on Lasix 40 mg MWF and 20 mg other days. She needs a BMP and she needs to let us know her wgt.  Kerin Ransom PA-C 03/21/2014 5:21 PM

## 2014-03-21 NOTE — Telephone Encounter (Signed)
She saw Lurena Joiner on 02-23-14 and he put her on a fluid pill until she saw [name] in May. She is now going out of town and her appt is now June. Should she see Lurena Joiner or what should she do about the medicine?

## 2014-03-21 NOTE — Telephone Encounter (Signed)
Returned a call to patient. She had to move her appointment to see Dr. Ellyn Hack because she will be out of town on 5/14. Her appointment was moved out 1 month into June. Lurena Joiner gave her instructions on how to take her diuretics until she sees Dr. Ellyn Hack. She wants to know if she should continue to take meds as Lurena Joiner instructed for additional month. I will forward message to [facility] for recommendation.

## 2014-03-22 ENCOUNTER — Telehealth: Payer: Self-pay | Admitting: *Deleted

## 2014-03-22 DIAGNOSIS — Z79899 Other long term (current) drug therapy: Secondary | ICD-10-CM

## 2014-03-22 NOTE — Telephone Encounter (Signed)
Called and informed patient per [name] to continue lasix 40 mg M/W/F 20 mg the other days. Get BMP and inform us of her weight. Patient states that she will have labs drawn next week. Order for BMP placed into [facility].

## 2014-03-22 NOTE — Telephone Encounter (Signed)
Called and informed patient of Luke's instruction's.

## 2014-03-24 ENCOUNTER — Ambulatory Visit: Payer: Medicare Other | Admitting: Cardiology

## 2014-03-29 ENCOUNTER — Encounter: Payer: Self-pay | Admitting: Family Medicine

## 2014-04-12 LAB — BASIC METABOLIC PANEL
BUN: 59 mg/dL — ABNORMAL HIGH (ref 6–23)
CALCIUM: 9 mg/dL (ref 8.4–10.5)
CO2: 27 meq/L (ref 19–32)
CREATININE: 2.22 mg/dL — AB (ref 0.50–1.10)
Chloride: 100 mEq/L (ref 96–112)
Glucose, Bld: 68 mg/dL — ABNORMAL LOW (ref 70–99)
Potassium: 4.3 mEq/L (ref 3.5–5.3)
Sodium: 138 mEq/L (ref 135–145)

## 2014-04-22 ENCOUNTER — Telehealth: Payer: Self-pay | Admitting: *Deleted

## 2014-04-22 NOTE — Telephone Encounter (Signed)
Message copied by Raiford Simmonds on Fri Apr 22, 2014  5:14 PM ------      Message from: [name]      Created: Thu Apr 14, 2014  6:56 PM       Overall - labs are "stable" if not a bit better.      Still precludes using ACE-I or ARB.       If we need additional BP meds - would opt for CCB vs. Hydralazine/nitrate            [name], MD       ------

## 2014-04-22 NOTE — Telephone Encounter (Signed)
Left message to call back  

## 2014-04-25 ENCOUNTER — Encounter: Payer: Self-pay | Admitting: Cardiology

## 2014-04-25 ENCOUNTER — Ambulatory Visit (INDEPENDENT_AMBULATORY_CARE_PROVIDER_SITE_OTHER): Payer: Medicare Other | Admitting: Cardiology

## 2014-04-25 VITALS — BP 174/64 | HR 54 | Ht 64.0 in | Wt 142.5 lb

## 2014-04-25 DIAGNOSIS — I5032 Chronic diastolic (congestive) heart failure: Secondary | ICD-10-CM

## 2014-04-25 DIAGNOSIS — I4891 Unspecified atrial fibrillation: Secondary | ICD-10-CM

## 2014-04-25 DIAGNOSIS — N183 Chronic kidney disease, stage 3 unspecified: Secondary | ICD-10-CM

## 2014-04-25 DIAGNOSIS — Z8679 Personal history of other diseases of the circulatory system: Secondary | ICD-10-CM

## 2014-04-25 DIAGNOSIS — Z79899 Other long term (current) drug therapy: Secondary | ICD-10-CM

## 2014-04-25 DIAGNOSIS — I480 Paroxysmal atrial fibrillation: Secondary | ICD-10-CM

## 2014-04-25 DIAGNOSIS — I119 Hypertensive heart disease without heart failure: Secondary | ICD-10-CM

## 2014-04-25 NOTE — Patient Instructions (Addendum)
Your physician recommends that you schedule a follow-up appointment in:  3Months with Lurena Joiner, 6 Months with [name]  Your physician has recommended you make the following change in your medication: Take lasix 1 daily if weight gain is over 3lbs take 2 tablets until if decrease. Increase hydralazine to 1 1/2 tablets twice daily

## 2014-04-26 NOTE — Assessment & Plan Note (Signed)
Continue to monitor her thyroid function tests as well as LFTs. She gets annual eye exams. Will have PFTs done next winter and annually.

## 2014-04-26 NOTE — Assessment & Plan Note (Signed)
Creatinine is 2.22. Hopefully as she backs off Lasix this was stabilized I think her baseline is probably somewhere around 2. As well as making urine were okay. We'll continue to hold ACE inhibitor.

## 2014-04-26 NOTE — Assessment & Plan Note (Addendum)
Relatively stable. The mildly elevated creatinine and BUN if we can back off to once daily dose of 20 mg Lasix daily increased to 40 mg when necessary weight gain greater than 3 pounds. Will increase hydralazine to 37.5 twice a day for additional a reduction since her blood pressure is elevated. Not on ACE inhibitor due to her renal insufficiency and beta blocker to avoid bradycardia. We'll probably need to continue increasing hydralazine, however she does have some orthostatic symptoms making it difficult.

## 2014-04-26 NOTE — Assessment & Plan Note (Signed)
Awaiting beta blocker and calcium channel blocker to prevent excess bradycardia.

## 2014-04-26 NOTE — Progress Notes (Signed)
PATIENT: [name] MRN: [id number]  DOB: [birth year]   DOV:04/26/2014 PCP: [name], MD  Clinic Note: Chief Complaint  Patient presents with  . 3 MONTH VISIT    NO CHEST PAIN, NO SOB , , EDEMA , NEED TO GO OVER LABS   HPI: [name] is a 80 y.o.  female with a PMH below who presents today for followup of her recently atrial fibrillation with diastolic heart failure. She saw Kerin Ransom, PA-C. on April 15 and was doing relatively well, but her creatinine level had increased. Otherwise she was doing relatively well.  Interval History: She presents today again doing relatively well. Her repeat that showed chemistry with stable creatinine of 2.24. I am not sure if baseline is, her lower sensing was 1.85 and the highest being roughly 2.6. She still makes urine without any difficulty. She has been taking alternating dose of Lasix 40 mg mobility Friday and 20 days. She notes a notable weight loss of about 10 pounds. She lives with her list of her home blood pressures usually run in the [id number] range systolic. She denies any PND orthopnea or edema has improved. She still has varicosities. She has not had a sensation of rapid or irregular heartbeats to suggest recurrence of persistent A. Fib but has had intermittent episodes of passing several minutes of irregular heartbeats which could be very paroxysms of A. fib.  No chest tightness or pressure at rest or exertion. No exertional dyspnea. She is exercising at the [facility] at least 2-3 days a week for an hour and time.Her way here remained relatively stable. Down to a stable 1.2. She denies any headaches or blurred vision syncope/near syncope, TIA amaurosis fugax symptoms. She does get a little bit of orthostatic symptoms. No melena, hematochezia, hematuria, epistaxis. No claudication.   Past Medical History  Diagnosis Date  . Hyperlipidemia   . Hypothyroidism   . Arthritis   . Anemia   . Asthma   . Anxiety   . IBS (irritable bowel syndrome)    . Gout   . Colon polyps   . Tubulovillous adenoma 4/07  . Hemorrhoids   . Diverticulosis   . Chronic diarrhea   . Chronic kidney disease stage III (GFR 30-59 ml/min)   . Hypertensive heart disease     no significant RAS by MRA 2002- (<30% LRAS)   . Paroxysmal atrial fibrillation 11/01/2013    Intermittent through the years and recurrent in December of 2014 treated with amiodarone;;   . Peripheral vascular disease     123456 LICA, [id number] RICA by doppler 2009   . Benign positional vertigo   . LBBB (left bundle branch block)- new 11/01/13 11/01/2013  . Diastolic dysfunction, left ventricle 11/03/2013    Prior Cardiac Evaluation and Past Surgical History: Procedure Laterality Date  . Transthoracic echocardiogram  11/03/2013    Echocardiogram: EF 55-60%, mild LVH, elevated bili pressures. Mild aortic valve calcification.  Marland Kitchen Nm myoview ltd  11/02/2013    Negative for ischemia or infarction. EF 60%.    Allergies  Allergen Reactions  . Amoxicillin     REACTION: unspecified  . Codeine Phosphate     REACTION: unspecified  . Lipitor [Atorvastatin] Other (See Comments)    "makes legs jump all night"  . Penicillins     REACTION: nausea, swelling  . Xifaxan [Rifaximin] Other (See Comments)    unknown   Current Outpatient Prescriptions  Medication Sig Dispense Refill  . amiodarone (PACERONE) 200 MG tablet Take  200 mg by mouth daily.      Marland Kitchen amLODipine (NORVASC) 5 MG tablet Take 1 tablet (5 mg total) by mouth daily.  30 tablet  0  . apixaban (ELIQUIS) 2.5 MG TABS tablet Take 1 tablet (2.5 mg total) by mouth 2 (two) times daily.  60 tablet  6  . cyanocobalamin 1000 MCG tablet Take 1 tablet (1,000 mcg total) by mouth daily. Contact PCP for refills.  30 tablet  11  . hyoscyamine (LEVSIN, ANASPAZ) 0.125 MG tablet Take 1-2 tablets by mouth or under tongue every 4 hours as needed before meals  360 tablet  5  . isosorbide mononitrate (IMDUR) 30 MG 24 hr tablet Take 1 tablet (30 mg total) by  mouth daily.  30 tablet  0  . levothyroxine (SYNTHROID, LEVOTHROID) 88 MCG tablet Take 88 mcg by mouth daily before breakfast.      . omeprazole (PRILOSEC) 40 MG capsule Take 1 capsule (40 mg total) by mouth daily.  30 capsule  11  . temazepam (RESTORIL) 30 MG capsule Take 30 mg by mouth at bedtime as needed for sleep (restless leg).       No current facility-administered medications for this visit.    History   Social History Narrative   She is a widowed mother of 2, one child is deceased. His grandmother of one. She appears to be working out routinely at [employer] using [employer]. She is otherwise quite active.   She does not drink alcohol, does not smoke.   ROS: A comprehensive Review of Systems - Negative except Mild edema, GERD. Otherwise noted above.  PHYSICAL EXAM BP 174/64  Pulse 54  Ht 5\' 4"  (1.626 m)  Wt 142 lb 8 oz (64.638 kg)  BMI 24.45 kg/m2 General appearance: alert, cooperative, appears stated age, no distress and Well-nourished and well-groomed. Neck: no adenopathy, no carotid bruit and no JVD Lungs: clear to auscultation bilaterally, normal percussion bilaterally and Nonlabored, good air movement Heart: Bradycardic with regular rhythm. Split S2 but no M/R/G.Marland Kitchen Nondisplaced PMI. Abdomen: soft, non-tender; bowel sounds normal; no masses,  no organomegaly Extremities: edema  1+ left greater than right, no ulcers, gangrene or trophic changes and varicose veins noted Pulses: 2+ and symmetric Neurologic: Grossly normal  [id number] today: No  Recent Labs: notable for BUN/creatinine 59/2.22   ASSESSMENT / PLAN: Paroxysmal atrial fibrillation She seems to be maintaining NSR on baseline dose amiodarone. She had PFTs checked in the winter and will be due for them next year. Left less symptomatic when in sinus rhythm.  Chronic diastolic heart failure Relatively stable. The mildly elevated creatinine and BUN if we can back off to once daily dose of 20 mg Lasix  daily increased to 40 mg when necessary weight gain greater than 3 pounds. Will increase hydralazine to 37.5 twice a day for additional a reduction since her blood pressure is elevated. Not on ACE inhibitor due to her renal insufficiency and beta blocker to avoid bradycardia. We'll probably need to continue increasing hydralazine, however she does have some orthostatic symptoms making it difficult.  Chronic kidney disease stage III (GFR 30-59 ml/min) Creatinine is 2.22. Hopefully as she backs off Lasix this was stabilized I think her baseline is probably somewhere around 2. As well as making urine were okay. We'll continue to hold ACE inhibitor.  Hypertensive heart disease Blood pressure seems labile. Her blood pressures at home are in the 12/01/1948 range. Therefore sure if today's reading is simply spurious. Increase hydralazine.  On amiodarone therapy Continue to monitor her thyroid function tests as well as LFTs. She gets annual eye exams. Will have PFTs done next winter and annually.  H/O tachycardia-bradycardia syndrome Awaiting beta blocker and calcium channel blocker to prevent excess bradycardia.    No orders of the defined types were placed in this encounter.   No orders of the defined types were placed in this encounter.    Followup:  3 monthsWith Kerin Ransom to 6 months with Dr. Ellyn Hack   DAVID W. Ellyn Hack, M.D., M.S. Interventional Cardiology [facility]

## 2014-04-26 NOTE — Assessment & Plan Note (Signed)
She seems to be maintaining NSR on baseline dose amiodarone. She had PFTs checked in the winter and will be due for them next year. Left less symptomatic when in sinus rhythm.

## 2014-04-26 NOTE — Assessment & Plan Note (Addendum)
Blood pressure seems labile. Her blood pressures at home are in the 12/01/1948 range. Therefore sure if today's reading is simply spurious. Increase hydralazine.

## 2014-04-28 ENCOUNTER — Telehealth: Payer: Self-pay | Admitting: Cardiology

## 2014-04-28 MED ORDER — HYDRALAZINE HCL 25 MG PO TABS: 37.5000 mg | ORAL_TABLET | Freq: Two times a day (BID) | ORAL | Status: DC

## 2014-04-28 NOTE — Telephone Encounter (Signed)
Late entry-- patient had an appointment with [name]. Result given. Patient was informed about her PFT schedule for earlier next year.

## 2014-04-28 NOTE — Telephone Encounter (Signed)
Rx was sent to pharmacy electronically. Patient notified.  

## 2014-04-28 NOTE — Telephone Encounter (Signed)
Patient states that she saw Dr. Ellyn Hack the other day and he increased her Hydralazine 25 mg to 1 1/2 daily.  Please call [facility] on [address] for refill---patient is going out of town this weekend and does not want to run out of medication.

## 2014-05-18 ENCOUNTER — Encounter: Payer: Self-pay | Admitting: Family

## 2014-05-18 ENCOUNTER — Ambulatory Visit (INDEPENDENT_AMBULATORY_CARE_PROVIDER_SITE_OTHER): Payer: Medicare Other | Admitting: Family

## 2014-05-18 ENCOUNTER — Telehealth: Payer: Self-pay | Admitting: Family Medicine

## 2014-05-18 VITALS — BP 164/60 | Temp 98.1°F | Ht 64.0 in | Wt 144.0 lb

## 2014-05-18 DIAGNOSIS — H00019 Hordeolum externum unspecified eye, unspecified eyelid: Secondary | ICD-10-CM

## 2014-05-18 DIAGNOSIS — H00013 Hordeolum externum right eye, unspecified eyelid: Secondary | ICD-10-CM

## 2014-05-18 DIAGNOSIS — I10 Essential (primary) hypertension: Secondary | ICD-10-CM

## 2014-05-18 MED ORDER — CEPHALEXIN 500 MG PO CAPS: 500.0000 mg | ORAL_CAPSULE | Freq: Four times a day (QID) | ORAL | Status: DC

## 2014-05-18 NOTE — Progress Notes (Signed)
Subjective:    Patient ID: [name], female    DOB: [birth year], 80 y.o.   MRN: [id number]  HPI 80 year old WF, nonsmoker, is in today with c/o a sty to her left eye x 3 days. Exhibits redness, soreness, and tender to touch. Has been applying warm compresses without relief.    Review of Systems  Constitutional: Negative.   HENT: Negative.   Eyes: Positive for pain and redness.       Sty to upper left eyelid  Respiratory: Negative.   Cardiovascular: Negative.   Endocrine: Negative.   Musculoskeletal: Negative.   Skin: Negative.   Allergic/Immunologic: Negative.   Neurological: Negative.   Psychiatric/Behavioral: Negative.    Past Medical History  Diagnosis Date  . Hyperlipidemia   . Hypothyroidism   . Arthritis   . Anemia   . Asthma   . Anxiety   . IBS (irritable bowel syndrome)   . Gout   . Colon polyps   . Tubulovillous adenoma 4/07  . Hemorrhoids   . Diverticulosis   . Chronic diarrhea   . Chronic kidney disease stage III (GFR 30-59 ml/min)   . Hypertensive heart disease     no significant RAS by MRA 2002- (<30% LRAS)   . Paroxysmal atrial fibrillation 11/01/2013    Intermittent through the years and recurrent in December of 2014 treated with amiodarone;;   . Peripheral vascular disease     123456 LICA, [id number] RICA by doppler 2009   . Benign positional vertigo   . LBBB (left bundle branch block)- new 11/01/13 11/01/2013  . Diastolic dysfunction, left ventricle 11/03/2013    History   Social History  . Marital Status: Widowed    Spouse Name: N/A    Number of Children: N/A  . Years of Education: N/A   Occupational History  . Not on file.   Social History Main Topics  . Smoking status: Never Smoker   . Smokeless tobacco: Never Used     Comment: never used tobacco  . Alcohol Use: No  . Drug Use: No  . Sexual Activity: No   Other Topics Concern  . Not on file   Social History Narrative   She is a widowed mother of 2, one child is deceased.  His grandmother of one. She appears to be working out routinely at [employer] using [employer]. She is otherwise quite active.   She does not drink alcohol, does not smoke.    Past Surgical History  Procedure Laterality Date  . Lumbar disc surgery  2000  . Squamous cell skin cancer removed    . Oophorectomy  1962    right  . Appendectomy    . Low anterior bowel resection  7/08  . Cataract extraction    . Transthoracic echocardiogram  11/03/2013    Echocardiogram: EF 55-60%, mild LVH, elevated bili pressures. Mild aortic valve calcification.  Marland Kitchen Nm myoview ltd  11/02/2013    Negative for ischemia or infarction. EF 60%.    Family History  Problem Relation Age of Onset  . Cancer Mother 18  . Heart attack Father   . Heart disease Brother   . Stroke Maternal Grandmother   . Cancer Maternal Grandfather     Lung cancer  . Stroke Paternal Grandmother   . Cancer Daughter     Allergies  Allergen Reactions  . Amoxicillin     REACTION: unspecified  . Codeine Phosphate     REACTION: unspecified  .  Lipitor [Atorvastatin] Other (See Comments)    "makes legs jump all night"  . Penicillins     REACTION: nausea, swelling  . Xifaxan [Rifaximin] Other (See Comments)    unknown    Current Outpatient Prescriptions on File Prior to Visit  Medication Sig Dispense Refill  . amiodarone (PACERONE) 200 MG tablet Take 200 mg by mouth daily.      Marland Kitchen apixaban (ELIQUIS) 2.5 MG TABS tablet Take 1 tablet (2.5 mg total) by mouth 2 (two) times daily.  60 tablet  6  . Cyanocobalamin (VITAMIN B 12 PO) Take 1,000 mcg by mouth every morning.      . furosemide (LASIX) 40 MG tablet Take 40 mg MWF, 20 mg T,Th,Sat, Sun  90 tablet  3  . hydrALAZINE (APRESOLINE) 25 MG tablet Take 1.5 tablets (37.5 mg total) by mouth 2 (two) times daily.  270 tablet  3  . hyoscyamine (LEVSIN, ANASPAZ) 0.125 MG tablet Take 1-2 tablets by mouth or under tongue every 4 hours as needed before meals  360 tablet  5  . isosorbide  mononitrate (IMDUR) 30 MG 24 hr tablet Take 1 tablet (30 mg total) by mouth daily.  30 tablet  11  . levothyroxine (SYNTHROID, LEVOTHROID) 88 MCG tablet Take 88 mcg by mouth daily before breakfast.      . temazepam (RESTORIL) 30 MG capsule Take 30 mg by mouth at bedtime as needed for sleep (restless leg).       No current facility-administered medications on file prior to visit.    BP 164/60  Temp(Src) 98.1 F (36.7 C) (Oral)  Ht 5\' 4"  (1.626 m)  Wt 144 lb (65.318 kg)  BMI 24.71 kg/m2chart     Objective:   Physical Exam  Constitutional: She is oriented to person, place, and time. She appears well-developed and well-nourished.  HENT:  Right Ear: External ear normal.  Left Ear: External ear normal.  Nose: Nose normal.  Mouth/Throat: Oropharynx is clear and moist.  Eyes: Conjunctivae are normal. Pupils are equal, round, and reactive to light. Left eye exhibits hordeolum.    Neck: Normal range of motion. Neck supple.  Cardiovascular: Normal rate, regular rhythm and normal heart sounds.   Pulmonary/Chest: Effort normal and breath sounds normal.  Musculoskeletal: Normal range of motion.  Neurological: She is alert and oriented to person, place, and time.  Skin: Skin is warm and dry.  Psychiatric: She has a normal mood and affect.          Assessment & Plan:  [name] was seen today for stye on right eye.  Diagnoses and associated orders for this visit:  Stye external, right  Other Orders - cephALEXin (KEFLEX) 500 MG capsule; Take 1 capsule (500 mg total) by mouth 4 (four) times daily.   Call the office with any questions or concerns. Recheck as needed and as scheduled.

## 2014-05-18 NOTE — Telephone Encounter (Signed)
Patient Information:  Caller Name: Kaylyn  Phone: [phone]  Patient: Real, [name]  Gender: Female  DOB: 07/11/1924  Age: 80 Years  PCP: [name] [facility])  Office Follow Up:  Does the office need to follow up with this patient?: No  Instructions For The Office: N/A   Symptoms  Reason For Call & Symptoms: Pt reports she has sty on the upper  left eyelid.  Reviewed Health History In EMR: Yes  Reviewed Medications In EMR: Yes  Reviewed Allergies In EMR: Yes  Reviewed Surgeries / Procedures: Yes  Date of Onset of Symptoms: 05/16/2014  Guideline(s) Used:  Skin Lesion - Moles or Growths  Eye Pain  Disposition Per Guideline:   See Today in Office  Reason For Disposition Reached:   Patient wants to be seen  Advice Given:  Call Back If:  You become worse.  Call Back If:  You become worse.  Patient Will Follow Care Advice:  YES  Appointment Scheduled:  05/18/2014 11:15:00 Appointment Scheduled Provider:  Roxy Cedar [facility])

## 2014-05-18 NOTE — Telephone Encounter (Signed)
Relevant patient education mailed to patient.  

## 2014-05-18 NOTE — Progress Notes (Signed)
Pre visit review using our clinic review tool, if applicable. No additional management support is needed unless otherwise documented below in the visit note. 

## 2014-05-18 NOTE — Telephone Encounter (Signed)
Noted  

## 2014-05-18 NOTE — Patient Instructions (Signed)
Sty A sty (hordeolum) is an infection of a gland in the eyelid located at the base of the eyelash. A sty may develop a white or yellow head of pus. It can be puffy (swollen). Usually, the sty will burst and pus will come out on its own. They do not leave lumps in the eyelid once they drain. A sty is often confused with another form of cyst of the eyelid called a chalazion. Chalazions occur within the eyelid and not on the edge where the bases of the eyelashes are. They often are red, sore and then form firm lumps in the eyelid. CAUSES   Germs (bacteria).  Lasting (chronic) eyelid inflammation. SYMPTOMS   Tenderness, redness and swelling along the edge of the eyelid at the base of the eyelashes.  Sometimes, there is a white or yellow head of pus. It may or may not drain. DIAGNOSIS  An ophthalmologist will be able to distinguish between a sty and a chalazion and treat the condition appropriately.  TREATMENT   Styes are typically treated with warm packs (compresses) until drainage occurs.  In rare cases, medicines that kill germs (antibiotics) may be prescribed. These antibiotics may be in the form of drops, cream or pills.  If a hard lump has formed, it is generally necessary to do a small incision and remove the hardened contents of the cyst in a minor surgical procedure done in the office.  In suspicious cases, your caregiver may send the contents of the cyst to the lab to be certain that it is not a rare, but dangerous form of cancer of the glands of the eyelid. HOME CARE INSTRUCTIONS   Wash your hands often and dry them with a clean towel. Avoid touching your eyelid. This may spread the infection to other parts of the eye.  Apply heat to your eyelid for 10 to 20 minutes, several times a day, to ease pain and help to heal it faster.  Do not squeeze the sty. Allow it to drain on its own. Wash your eyelid carefully 3 to 4 times per day to remove any pus. SEEK IMMEDIATE MEDICAL CARE IF:    Your eye becomes painful or puffy (swollen).  Your vision changes.  Your sty does not drain by itself within 3 days.  Your sty comes back within a short period of time, even with treatment.  You have redness (inflammation) around the eye.  You have a fever. Document Released: 08/07/2005 Document Revised: 01/20/2012 Document Reviewed: 04/11/2009 [facility] Patient Information 2015 [place], [place]. This information is not intended to replace advice given to you by your health care provider. Make sure you discuss any questions you have with your health care provider.

## 2014-05-25 ENCOUNTER — Other Ambulatory Visit: Payer: Self-pay | Admitting: Dermatology

## 2014-05-26 ENCOUNTER — Other Ambulatory Visit: Payer: Self-pay | Admitting: Family Medicine

## 2014-05-27 NOTE — Telephone Encounter (Signed)
Call in #30 with 5 rf 

## 2014-06-17 ENCOUNTER — Encounter: Payer: Self-pay | Admitting: Family Medicine

## 2014-06-17 ENCOUNTER — Ambulatory Visit (INDEPENDENT_AMBULATORY_CARE_PROVIDER_SITE_OTHER): Payer: Medicare Other | Admitting: Family Medicine

## 2014-06-17 VITALS — BP 170/68 | HR 58 | Temp 98.2°F | Ht 64.0 in | Wt 144.0 lb

## 2014-06-17 DIAGNOSIS — M109 Gout, unspecified: Secondary | ICD-10-CM

## 2014-06-17 MED ORDER — PREDNISONE 10 MG PO TABS: ORAL_TABLET | ORAL | Status: DC

## 2014-06-17 MED ORDER — TEMAZEPAM 30 MG PO CAPS: ORAL_CAPSULE | ORAL | Status: DC

## 2014-06-17 NOTE — Progress Notes (Signed)
   Subjective:    Patient ID: PORTIA MATKIN, female    DOB: 19-Nov-1923, 80 y.o.   MRN: [id number]  HPI Here to follow up gout in the right great toe which started about 4 weeks ago. She saw Urgent Care on 05-22-14 and was given a Medrol dose pack and Colchicine. She did not tolerate the Colchicine due to GI distress but she finished the dose pack. Her toe improved somewhat but it is still painful. She is worried because she has a trip next month to [place] amusement [place] with her grandchildren and she expects to be walking around all day.    Review of Systems  Constitutional: Negative.   Musculoskeletal: Positive for arthralgias and joint swelling.       Objective:   Physical Exam  Constitutional: She appears well-developed and well-nourished.  Musculoskeletal:  The right great toe is swollen and red around the MTP. This is warm and quite tender          Assessment & Plan:  Partially treated gout. We will give her another taper of prednisone, but this time after the taper we will keep her at the 10 mg daily dose for at least a few months

## 2014-06-17 NOTE — Progress Notes (Signed)
Pre visit review using our clinic review tool, if applicable. No additional management support is needed unless otherwise documented below in the visit note. 

## 2014-07-11 ENCOUNTER — Telehealth: Payer: Self-pay | Admitting: Family Medicine

## 2014-07-11 NOTE — Telephone Encounter (Addendum)
Pt states in the past mo she has  had difficulty sleepingl some nights she doesn't sleep at all.  Pt would like to know on those nights if she could take an extra temazepam (RESTORIL) 30 MG capsule.  pls advise  Pt also would like a refill on LORazepam (ATIVAN) 0.5 MG tablet .  Pt states its been over a year since she had that rx.  Will make appt if needs to.  [facility]

## 2014-07-11 NOTE — Telephone Encounter (Signed)
(  1) call in Lorazepam 0.5 mg to take every 8 hours prn anxiety, #60 with 5 rf. (2) she should NOT take an extra Temazepam

## 2014-07-12 MED ORDER — LORAZEPAM 0.5 MG PO TABS: 0.5000 mg | ORAL_TABLET | Freq: Three times a day (TID) | ORAL | Status: DC | PRN

## 2014-07-12 NOTE — Telephone Encounter (Signed)
I called in script and spoke with pt. 

## 2014-07-20 ENCOUNTER — Encounter: Payer: Self-pay | Admitting: Gastroenterology

## 2014-07-26 ENCOUNTER — Ambulatory Visit (INDEPENDENT_AMBULATORY_CARE_PROVIDER_SITE_OTHER): Payer: Medicare Other | Admitting: Cardiology

## 2014-07-26 ENCOUNTER — Encounter: Payer: Self-pay | Admitting: Cardiology

## 2014-07-26 VITALS — BP 160/70 | HR 53 | Ht 64.0 in | Wt 141.1 lb

## 2014-07-26 DIAGNOSIS — Z79899 Other long term (current) drug therapy: Secondary | ICD-10-CM

## 2014-07-26 DIAGNOSIS — I447 Left bundle-branch block, unspecified: Secondary | ICD-10-CM

## 2014-07-26 DIAGNOSIS — I480 Paroxysmal atrial fibrillation: Secondary | ICD-10-CM

## 2014-07-26 DIAGNOSIS — Z7901 Long term (current) use of anticoagulants: Secondary | ICD-10-CM

## 2014-07-26 DIAGNOSIS — N184 Chronic kidney disease, stage 4 (severe): Secondary | ICD-10-CM

## 2014-07-26 DIAGNOSIS — I119 Hypertensive heart disease without heart failure: Secondary | ICD-10-CM

## 2014-07-26 DIAGNOSIS — I4891 Unspecified atrial fibrillation: Secondary | ICD-10-CM

## 2014-07-26 DIAGNOSIS — R251 Tremor, unspecified: Secondary | ICD-10-CM | POA: Insufficient documentation

## 2014-07-26 DIAGNOSIS — F411 Generalized anxiety disorder: Secondary | ICD-10-CM

## 2014-07-26 DIAGNOSIS — R259 Unspecified abnormal involuntary movements: Secondary | ICD-10-CM

## 2014-07-26 NOTE — Assessment & Plan Note (Signed)
Low risk Myoview Dec 2014

## 2014-07-26 NOTE — Assessment & Plan Note (Addendum)
She has noticed hand tremors, worse when witting. She thinks this is new over the past several months.

## 2014-07-26 NOTE — Assessment & Plan Note (Signed)
She is always "nervous" but seems fine today

## 2014-07-26 NOTE — Patient Instructions (Addendum)
No change with current medications  Your physician wants you to follow-up in 2 months [name].  You will receive a reminder letter in the mail two months in advance. If you don't receive a letter, please call our office to schedule the follow-up appointment.

## 2014-07-26 NOTE — Assessment & Plan Note (Signed)
GFR 19 

## 2014-07-26 NOTE — Progress Notes (Signed)
07/26/2014 [name]   08-Apr-1924  [id number]  Primary Physicia Lindsey Morale, MD Primary Cardiologist: Dr [name]  HPI:  80 y/o female with a remote history of PAF. She had been in NSR for years. We saw he in the ER at [facility] 11/01/13 when she presented with multiple somatic complaints and was noted to be in AF with a new LBBB. Beta blocker was added. A Myoview was low risk. Echo showed good LVF with RAE, LVH,LAE, and PA pressures of 47 mmHg. TSH, LFTs, electrolytes were WNL.          She wast seen 12/01/13 and was noted to be bradycardic and SOB. We stopped her beta blocker and decreased her Amiodarone. She ended up at [facility] [id number] with diastolic CHF.           She is in the office today for Routine follow up. She has been doing well from a cardiac standpoint, no palpitations, no chest pain, and no unusual dyspnea. She has had some problems with gout and was treated with a dose pack. She saw Dr [name] who told her her kidneys were stabel and she could take a low dose diuretic daily for edema if she felt she needed it, she does not. Her SCr in June was 2.22. She brought a list of her wgts and B/P and these do in fact appear to be stable.            She has noted a tremor in her hands. She thinks this is new over the last several months but it seems to me its always been there.  She wondered if it was from Amiodarone.     Current Outpatient Prescriptions  Medication Sig Dispense Refill  . amiodarone (PACERONE) 200 MG tablet Take 200 mg by mouth daily.      Marland Kitchen apixaban (ELIQUIS) 2.5 MG TABS tablet Take 1 tablet (2.5 mg total) by mouth 2 (two) times daily.  60 tablet  6  . cephALEXin (KEFLEX) 500 MG capsule Take 1 capsule (500 mg total) by mouth 4 (four) times daily.  28 capsule  0  . Cyanocobalamin (VITAMIN B 12 PO) Take 1,000 mcg by mouth every morning.      . furosemide (LASIX) 40 MG tablet Take 40 mg MWF, 20 mg T,Th,Sat, Sun  90 tablet  3  . hydrALAZINE (APRESOLINE) 25 MG tablet Take 1.5  tablets (37.5 mg total) by mouth 2 (two) times daily.  270 tablet  3  . hyoscyamine (LEVSIN, ANASPAZ) 0.125 MG tablet Take 1-2 tablets by mouth or under tongue every 4 hours as needed before meals  360 tablet  5  . isosorbide mononitrate (IMDUR) 30 MG 24 hr tablet Take 1 tablet (30 mg total) by mouth daily.  30 tablet  11  . levothyroxine (SYNTHROID, LEVOTHROID) 88 MCG tablet Take 88 mcg by mouth daily before breakfast.      . LORazepam (ATIVAN) 0.5 MG tablet Take 1 tablet (0.5 mg total) by mouth every 8 (eight) hours as needed.  60 tablet  5  . mupirocin ointment (BACTROBAN) 2 % Apply 1 application topically as needed.       . temazepam (RESTORIL) 30 MG capsule TAKE 1 CAPSULE AT BEDTIME AS NEEDED  30 capsule  5   No current facility-administered medications for this visit.    Allergies  Allergen Reactions  . Amoxicillin     REACTION: unspecified  . Codeine Phosphate     REACTION: unspecified  . Colchicine  Severe diarrhea  . Lipitor [Atorvastatin] Other (See Comments)    "makes legs jump all night"  . Penicillins     REACTION: nausea, swelling  . Xifaxan [Rifaximin] Other (See Comments)    unknown    History   Social History  . Marital Status: Widowed    Spouse Name: N/A    Number of Children: N/A  . Years of Education: N/A   Occupational History  . Not on file.   Social History Main Topics  . Smoking status: Never Smoker   . Smokeless tobacco: Never Used     Comment: never used tobacco  . Alcohol Use: No  . Drug Use: No  . Sexual Activity: No   Other Topics Concern  . Not on file   Social History Narrative   She is a widowed mother of 2, one child is deceased. His grandmother of one. She appears to be working out routinely at [employer] using [employer]. She is otherwise quite active.   She does not drink alcohol, does not smoke.     Review of Systems: General: negative for chills, fever, night sweats or weight changes.  Cardiovascular: negative for  chest pain, dyspnea on exertion, edema, orthopnea, palpitations, paroxysmal nocturnal dyspnea or shortness of breath Dermatological: negative for rash Respiratory: negative for cough or wheezing Urologic: negative for hematuria Abdominal: negative for nausea, vomiting, diarrhea, bright red blood per rectum, melena, or hematemesis Neurologic: negative for visual changes, syncope, or dizziness All other systems reviewed and are otherwise negative except as noted above.    Blood pressure 160/70, pulse 53, height 5\' 4"  (1.626 m), weight 141 lb 1.6 oz (64.003 kg).  General appearance: alert, cooperative and no distress Lungs: clear to auscultation bilaterally Heart: regular rate and rhythm Extremities: no edema  EKG NSR, SB   ASSESSMENT AND PLAN:   Tremor She has noticed hand tremors, worse when witting. She thinks this is new over the past several months.  On amiodarone therapy .  Paroxysmal atrial fibrillation Intermittent through the years and recurrent in December of 2014 treated with amiodarone. NSR today  Hypertensive heart disease Nl LVF by echo Dec 2014  Chronic anticoagulation- Eliquis .  LBBB (left bundle branch block)- new 11/01/13 Low risk Myoview Dec 2014  Anxiety She is always "nervous" but seems fine today  Chronic kidney disease, stage IV (severe) GFR 19    PLAN  I have asked her to monitor her hand tremor. If it persist, or get worse, we'll need to decrease her Amiodarone. I was hesitant to do that day since she has been doing so well with her AF. I'll have Dr [name] see her in 2 months. She asked about eating grapefruits an I told her this was probably not a good idea.   [name] KPA-C 07/26/2014 11:44 AM

## 2014-07-26 NOTE — Assessment & Plan Note (Signed)
Nl LVF by echo Dec 2014

## 2014-07-26 NOTE — Assessment & Plan Note (Signed)
Intermittent through the years and recurrent in December of 2014 treated with amiodarone. NSR today

## 2014-07-29 ENCOUNTER — Emergency Department (HOSPITAL_COMMUNITY): Payer: Medicare Other

## 2014-07-29 ENCOUNTER — Observation Stay (HOSPITAL_COMMUNITY)
Admission: EM | Admit: 2014-07-29 | Discharge: 2014-07-31 | Disposition: A | Discharge status: Home / Self Care | Payer: Medicare Other | Attending: Cardiovascular Disease | Admitting: Cardiovascular Disease

## 2014-07-29 ENCOUNTER — Encounter (HOSPITAL_COMMUNITY): Payer: Self-pay | Admitting: Emergency Medicine

## 2014-07-29 DIAGNOSIS — F411 Generalized anxiety disorder: Secondary | ICD-10-CM | POA: Insufficient documentation

## 2014-07-29 DIAGNOSIS — K219 Gastro-esophageal reflux disease without esophagitis: Secondary | ICD-10-CM | POA: Diagnosis present

## 2014-07-29 DIAGNOSIS — Z79899 Other long term (current) drug therapy: Secondary | ICD-10-CM | POA: Diagnosis not present

## 2014-07-29 DIAGNOSIS — N184 Chronic kidney disease, stage 4 (severe): Secondary | ICD-10-CM | POA: Insufficient documentation

## 2014-07-29 DIAGNOSIS — I130 Hypertensive heart and chronic kidney disease with heart failure and stage 1 through stage 4 chronic kidney disease, or unspecified chronic kidney disease: Secondary | ICD-10-CM | POA: Diagnosis present

## 2014-07-29 DIAGNOSIS — M109 Gout, unspecified: Secondary | ICD-10-CM | POA: Insufficient documentation

## 2014-07-29 DIAGNOSIS — N189 Chronic kidney disease, unspecified: Secondary | ICD-10-CM

## 2014-07-29 DIAGNOSIS — I5032 Chronic diastolic (congestive) heart failure: Secondary | ICD-10-CM | POA: Insufficient documentation

## 2014-07-29 DIAGNOSIS — I447 Left bundle-branch block, unspecified: Secondary | ICD-10-CM | POA: Diagnosis not present

## 2014-07-29 DIAGNOSIS — I4891 Unspecified atrial fibrillation: Secondary | ICD-10-CM | POA: Insufficient documentation

## 2014-07-29 DIAGNOSIS — Z888 Allergy status to other drugs, medicaments and biological substances status: Secondary | ICD-10-CM | POA: Diagnosis not present

## 2014-07-29 DIAGNOSIS — R079 Chest pain, unspecified: Secondary | ICD-10-CM

## 2014-07-29 DIAGNOSIS — Z7901 Long term (current) use of anticoagulants: Secondary | ICD-10-CM | POA: Diagnosis not present

## 2014-07-29 DIAGNOSIS — K589 Irritable bowel syndrome without diarrhea: Secondary | ICD-10-CM | POA: Diagnosis not present

## 2014-07-29 DIAGNOSIS — Z880 Allergy status to penicillin: Secondary | ICD-10-CM | POA: Diagnosis not present

## 2014-07-29 DIAGNOSIS — I119 Hypertensive heart disease without heart failure: Secondary | ICD-10-CM | POA: Diagnosis present

## 2014-07-29 DIAGNOSIS — I200 Unstable angina: Secondary | ICD-10-CM | POA: Diagnosis not present

## 2014-07-29 DIAGNOSIS — Z881 Allergy status to other antibiotic agents status: Secondary | ICD-10-CM | POA: Insufficient documentation

## 2014-07-29 DIAGNOSIS — D649 Anemia, unspecified: Secondary | ICD-10-CM | POA: Diagnosis present

## 2014-07-29 DIAGNOSIS — I208 Other forms of angina pectoris: Secondary | ICD-10-CM

## 2014-07-29 DIAGNOSIS — I509 Heart failure, unspecified: Secondary | ICD-10-CM | POA: Insufficient documentation

## 2014-07-29 DIAGNOSIS — I4819 Other persistent atrial fibrillation: Secondary | ICD-10-CM | POA: Diagnosis present

## 2014-07-29 LAB — BASIC METABOLIC PANEL
ANION GAP: 15 (ref 5–15)
BUN: 55 mg/dL — ABNORMAL HIGH (ref 6–23)
CHLORIDE: 101 meq/L (ref 96–112)
CO2: 22 mEq/L (ref 19–32)
Calcium: 8.7 mg/dL (ref 8.4–10.5)
Creatinine, Ser: 2.7 mg/dL — ABNORMAL HIGH (ref 0.50–1.10)
GFR calc non Af Amer: 15 mL/min — ABNORMAL LOW (ref >90)
GFR, EST AFRICAN AMERICAN: 17 mL/min — AB (ref >90)
Glucose, Bld: 111 mg/dL — ABNORMAL HIGH (ref 70–99)
Potassium: 4.8 mEq/L (ref 3.7–5.3)
Sodium: 138 mEq/L (ref 137–147)

## 2014-07-29 LAB — I-STAT TROPONIN, ED: Troponin i, poc: 0.01 ng/mL (ref 0.00–0.08)

## 2014-07-29 LAB — CBC
HEMATOCRIT: 35.7 % — AB (ref 36.0–46.0)
Hemoglobin: 11.8 g/dL — ABNORMAL LOW (ref 12.0–15.0)
MCH: 31.8 pg (ref 26.0–34.0)
MCHC: 33.1 g/dL (ref 30.0–36.0)
MCV: 96.2 fL (ref 78.0–100.0)
PLATELETS: 248 10*3/uL (ref 150–400)
RBC: 3.71 MIL/uL — ABNORMAL LOW (ref 3.87–5.11)
RDW: 14.9 % (ref 11.5–15.5)
WBC: 7.4 10*3/uL (ref 4.0–10.5)

## 2014-07-29 MED ORDER — ASPIRIN 81 MG PO CHEW
324.0000 mg | CHEWABLE_TABLET | Freq: Once | ORAL | Status: AC
  Administered 2014-07-29: 324 mg via ORAL
  Filled 2014-07-29: qty 4

## 2014-07-29 NOTE — ED Provider Notes (Signed)
CSN: [id number]     Arrival date & time 07/29/14  2130 History   First MD Initiated Contact with Patient 07/29/14 2152     Chief Complaint  Patient presents with  . Chest Pain  . Palpitations     (Consider location/radiation/quality/duration/timing/severity/associated sxs/prior Treatment) Patient is a 80 y.o. female presenting with chest pain and palpitations.  Chest Pain Pain location:  Substernal area Pain quality comment:  Heaviness Pain radiates to:  Does not radiate Pain severity:  Moderate Onset quality:  Gradual Timing:  Intermittent Progression:  Resolved Context comment:  When walking Relieved by:  Rest Worsened by:  Exertion Associated symptoms: dizziness (mild lightheadedness)   Associated symptoms: no abdominal pain, no diaphoresis, no fever, no nausea, no palpitations (However, did endorse palpitations to nurse), no shortness of breath and not vomiting   Palpitations Associated symptoms: chest pain and dizziness (mild lightheadedness)   Associated symptoms: no diaphoresis, no nausea, no shortness of breath and no vomiting     Past Medical History  Diagnosis Date  . Hyperlipidemia   . Hypothyroidism   . Arthritis   . Anemia   . Asthma   . Anxiety   . IBS (irritable bowel syndrome)   . Gout   . Colon polyps   . Tubulovillous adenoma 4/07  . Hemorrhoids   . Diverticulosis   . Chronic diarrhea   . Chronic kidney disease stage III (GFR 30-59 ml/min)   . Hypertensive heart disease     no significant RAS by MRA 2002- (<30% LRAS)   . Paroxysmal atrial fibrillation 11/01/2013    Intermittent through the years and recurrent in December of 2014 treated with amiodarone;;   . Peripheral vascular disease     123456 LICA, [id number] RICA by doppler 2009   . Benign positional vertigo   . LBBB (left bundle branch block)- new 11/01/13 11/01/2013  . Diastolic dysfunction, left ventricle 11/03/2013   Past Surgical History  Procedure Laterality Date  . Lumbar disc surgery   2000  . Squamous cell skin cancer removed    . Oophorectomy  1962    right  . Appendectomy    . Low anterior bowel resection  7/08  . Cataract extraction    . Transthoracic echocardiogram  11/03/2013    Echocardiogram: EF 55-60%, mild LVH, elevated bili pressures. Mild aortic valve calcification.  Marland Kitchen Nm myoview ltd  11/02/2013    Negative for ischemia or infarction. EF 60%.   Family History  Problem Relation Age of Onset  . Cancer Mother 51  . Heart attack Father   . Heart disease Brother   . Stroke Maternal Grandmother   . Cancer Maternal Grandfather     Lung cancer  . Stroke Paternal Grandmother   . Cancer Daughter    History  Substance Use Topics  . Smoking status: Never Smoker   . Smokeless tobacco: Never Used     Comment: never used tobacco  . Alcohol Use: No   OB History   Grav Para Term Preterm Abortions TAB SAB Ect Mult Living                 Review of Systems  Constitutional: Negative for fever and diaphoresis.  Respiratory: Negative for shortness of breath.   Cardiovascular: Positive for chest pain. Negative for palpitations (However, did endorse palpitations to nurse).  Gastrointestinal: Negative for nausea, vomiting and abdominal pain.  Neurological: Positive for dizziness (mild lightheadedness).  All other systems reviewed and are negative.  Allergies  Amoxicillin; Codeine phosphate; Colchicine; Lipitor; Penicillins; and Xifaxan  Home Medications   Prior to Admission medications   Medication Sig Start Date End Date Taking? Authorizing Provider  amiodarone (PACERONE) 200 MG tablet Take 200 mg by mouth daily.    Historical Provider, MD  apixaban (ELIQUIS) 2.5 MG TABS tablet Take 1 tablet (2.5 mg total) by mouth 2 (two) times daily. 11/17/13   Erlene Quan, PA-C  cephALEXin (KEFLEX) 500 MG capsule Take 1 capsule (500 mg total) by mouth 4 (four) times daily. 05/18/14   Timoteo Gaul, FNP  Cyanocobalamin (VITAMIN B 12 PO) Take 1,000 mcg by mouth  every morning.    Historical Provider, MD  furosemide (LASIX) 40 MG tablet Take 40 mg MWF, 20 mg T,Th,Sat, Sun 02/23/14   Erlene Quan, PA-C  hydrALAZINE (APRESOLINE) 25 MG tablet Take 1.5 tablets (37.5 mg total) by mouth 2 (two) times daily. 04/28/14   [name], MD  hyoscyamine (LEVSIN, ANASPAZ) 0.125 MG tablet Take 1-2 tablets by mouth or under tongue every 4 hours as needed before meals 10/29/13   Ladene Artist, MD  isosorbide mononitrate (IMDUR) 30 MG 24 hr tablet Take 1 tablet (30 mg total) by mouth daily. 12/30/13   [name], MD  levothyroxine (SYNTHROID, LEVOTHROID) 88 MCG tablet Take 88 mcg by mouth daily before breakfast.    Historical Provider, MD  LORazepam (ATIVAN) 0.5 MG tablet Take 1 tablet (0.5 mg total) by mouth every 8 (eight) hours as needed. 07/12/14   [name], MD  mupirocin ointment (BACTROBAN) 2 % Apply 1 application topically as needed.  05/25/14   Historical Provider, MD  temazepam (RESTORIL) 30 MG capsule TAKE 1 CAPSULE AT BEDTIME AS NEEDED 06/17/14   [name], MD   BP 224/65  Pulse 57  Temp(Src) 98.7 F (37.1 C) (Oral)  Resp 20  SpO2 98% Physical Exam  Nursing note and vitals reviewed. Constitutional: She is oriented to person, place, and time. She appears well-developed and well-nourished. No distress.  HENT:  Head: Normocephalic and atraumatic.  Mouth/Throat: Oropharynx is clear and moist.  Eyes: Conjunctivae are normal. Pupils are equal, round, and reactive to light. No scleral icterus.  Neck: Neck supple.  Cardiovascular: Normal rate, regular rhythm, normal heart sounds and intact distal pulses.   No murmur heard. Pulmonary/Chest: Effort normal and breath sounds normal. No stridor. No respiratory distress. She has no rales.  Abdominal: Soft. Bowel sounds are normal. She exhibits no distension. There is no tenderness.  Musculoskeletal: Normal range of motion. She exhibits no edema.  Neurological: She is alert and oriented to person, place,  and time.  Skin: Skin is warm and dry. No rash noted.  Psychiatric: She has a normal mood and affect. Her behavior is normal.    ED Course  Procedures (including critical care time) Labs Review Labs Reviewed  CBC - Abnormal; Notable for the following:    RBC 3.71 (*)    Hemoglobin 11.8 (*)    HCT 35.7 (*)    All other components within normal limits  BASIC METABOLIC PANEL - Abnormal; Notable for the following:    Glucose, Bld 111 (*)    BUN 55 (*)    Creatinine, Ser 2.70 (*)    GFR calc non Af Amer 15 (*)    GFR calc Af Amer 17 (*)    All other components within normal limits  BASIC METABOLIC PANEL - Abnormal; Notable for the following:  BUN 57 (*)    Creatinine, Ser 2.68 (*)    GFR calc non Af Amer 15 (*)    GFR calc Af Amer 17 (*)    All other components within normal limits  TROPONIN I  TROPONIN I  TROPONIN I  I-STAT TROPOININ, ED    Imaging Review Dg Chest 2 View  07/29/2014   CLINICAL DATA:  Chest pressure.  EXAM: CHEST  2 VIEW  COMPARISON:  Chest radiograph 11/11/2021 2015  FINDINGS: The lungs are hyperexpanded, with flattening of the hemidiaphragms, compatible with COPD. Mild vascular congestion is noted. There is no evidence of pleural effusion or pneumothorax.  The heart is borderline enlarged. No acute osseous abnormalities are seen. A small hiatal hernia is noted.  IMPRESSION: 1. Findings of COPD.  Lungs otherwise grossly clear. 2. Mild vascular congestion and borderline cardiomegaly. 3. Small hiatal hernia noted.   Electronically Signed   By: Garald Balding M.D.   On: 07/29/2014 23:42  All radiology studies independently viewed by me.      EKG Interpretation   Date/Time:  Friday July 29 2014 21:35:00 EDT Ventricular Rate:  56 PR Interval:  198 QRS Duration: 146 QT Interval:  520 QTC Calculation: 502 R Axis:   -31 Text Interpretation:  Sinus rhythm Left bundle branch block Probable RV  involvement, suggest recording right precordial leads Baseline  wander No  significant change was found Confirmed by [facility]  MD, TREY ([id number]) on  07/29/2014 10:34:47 PM      MDM   Final diagnoses:  Angina of effort    Exertional chest pain.  No other significant symptoms.  EKG unchanged.  Troponin negative.  Cardiology consulted, admitted.    Houston Siren III, MD 07/30/14 [phone]

## 2014-07-29 NOTE — ED Notes (Signed)
Pt arrived to the ED with a complaint of chest pain and palpitations.  Pt was sent here from [facility] with a possible new onset afib.  Pt states she has had chest pressure since yesterday centralized on her chest.

## 2014-07-30 DIAGNOSIS — I447 Left bundle-branch block, unspecified: Secondary | ICD-10-CM

## 2014-07-30 DIAGNOSIS — R079 Chest pain, unspecified: Secondary | ICD-10-CM

## 2014-07-30 DIAGNOSIS — I4891 Unspecified atrial fibrillation: Secondary | ICD-10-CM

## 2014-07-30 DIAGNOSIS — I209 Angina pectoris, unspecified: Secondary | ICD-10-CM

## 2014-07-30 LAB — BASIC METABOLIC PANEL
Anion gap: 13 (ref 5–15)
BUN: 57 mg/dL — AB (ref 6–23)
CHLORIDE: 103 meq/L (ref 96–112)
CO2: 24 mEq/L (ref 19–32)
Calcium: 8.4 mg/dL (ref 8.4–10.5)
Creatinine, Ser: 2.68 mg/dL — ABNORMAL HIGH (ref 0.50–1.10)
GFR calc non Af Amer: 15 mL/min — ABNORMAL LOW (ref >90)
GFR, EST AFRICAN AMERICAN: 17 mL/min — AB (ref >90)
GLUCOSE: 94 mg/dL (ref 70–99)
POTASSIUM: 4.6 meq/L (ref 3.7–5.3)
SODIUM: 140 meq/L (ref 137–147)

## 2014-07-30 LAB — TROPONIN I: Troponin I: <0.3 ng/mL (ref <0.30)

## 2014-07-30 MED ORDER — ACETAMINOPHEN 325 MG PO TABS: 650.0000 mg | ORAL_TABLET | ORAL | Status: DC | PRN

## 2014-07-30 MED ORDER — ONDANSETRON HCL 4 MG/2ML IJ SOLN: 4.0000 mg | Freq: Four times a day (QID) | INTRAMUSCULAR | Status: DC | PRN

## 2014-07-30 MED ORDER — HYDRALAZINE HCL 25 MG PO TABS
37.5000 mg | ORAL_TABLET | Freq: Two times a day (BID) | ORAL | Status: DC
  Administered 2014-07-30 (×2): 37.5 mg via ORAL
  Filled 2014-07-30 (×4): qty 1.5

## 2014-07-30 MED ORDER — LORAZEPAM 0.5 MG PO TABS: 0.5000 mg | ORAL_TABLET | Freq: Three times a day (TID) | ORAL | Status: DC | PRN

## 2014-07-30 MED ORDER — ISOSORBIDE MONONITRATE ER 60 MG PO TB24
60.0000 mg | ORAL_TABLET | Freq: Every day | ORAL | Status: DC
  Administered 2014-07-30 – 2014-07-31 (×2): 60 mg via ORAL
  Filled 2014-07-30 (×2): qty 1

## 2014-07-30 MED ORDER — AMIODARONE HCL 200 MG PO TABS
200.0000 mg | ORAL_TABLET | Freq: Every day | ORAL | Status: DC
  Administered 2014-07-30 – 2014-07-31 (×2): 200 mg via ORAL
  Filled 2014-07-30 (×2): qty 1

## 2014-07-30 MED ORDER — FUROSEMIDE 10 MG/ML IJ SOLN
20.0000 mg | Freq: Once | INTRAMUSCULAR | Status: AC
Start: 2014-07-30 — End: 2014-07-30
  Administered 2014-07-30: 20 mg via INTRAVENOUS
  Filled 2014-07-30: qty 4

## 2014-07-30 MED ORDER — LEVOTHYROXINE SODIUM 88 MCG PO TABS
88.0000 ug | ORAL_TABLET | Freq: Every day | ORAL | Status: DC
  Administered 2014-07-30 – 2014-07-31 (×2): 88 ug via ORAL
  Filled 2014-07-30 (×3): qty 1

## 2014-07-30 MED ORDER — TEMAZEPAM 15 MG PO CAPS
30.0000 mg | ORAL_CAPSULE | Freq: Once | ORAL | Status: AC
  Administered 2014-07-30: 30 mg via ORAL
  Filled 2014-07-30: qty 2

## 2014-07-30 MED ORDER — NITROGLYCERIN 0.4 MG SL SUBL: 0.4000 mg | SUBLINGUAL_TABLET | SUBLINGUAL | Status: DC | PRN

## 2014-07-30 MED ORDER — ASPIRIN EC 81 MG PO TBEC
81.0000 mg | DELAYED_RELEASE_TABLET | Freq: Every day | ORAL | Status: DC
  Administered 2014-07-31: 81 mg via ORAL
  Filled 2014-07-30: qty 1

## 2014-07-30 MED ORDER — APIXABAN 2.5 MG PO TABS
2.5000 mg | ORAL_TABLET | Freq: Two times a day (BID) | ORAL | Status: DC
  Administered 2014-07-30 – 2014-07-31 (×3): 2.5 mg via ORAL
  Filled 2014-07-30 (×4): qty 1

## 2014-07-30 NOTE — Progress Notes (Signed)
UR completed 

## 2014-07-30 NOTE — H&P (Signed)
History and Physical  Patient ID: ALISHBA LITZENBERG MRN: [id number], SOB: August 04, 1924 80 y.o. Date of Encounter: 07/30/2014, 2:28 AM  Primary Physician: [name], MD Primary Cardiologist: Dr. Ellyn Hack  Chief Complaint: chest pain  HPI: 80 y.o. female w/ PMHx significant for PAF on amiodarone, CKD (GFR 15), PVD, LBBB, HFpEF who presented to [facility] on 07/30/2014 with complaints of chest pain with exertion.   She actually saw cardiology 2 days ago and reports everything was fine then. However, over the past 2 days since then, she has had exertional chest pressure. She states it has happened twice with ambulating to the car. No associated symptoms. Improves with resting. She states it is very similar to her presentation in December when she had afib. Had a MPI at that time which was negative for ischemia.  Went to [facility] who was concerned about the EKG (chronic LBBB) and referred her to [facility] but she preferred [facility] so she came here instead.  No chest pain. Feels back to normal and wants to go home.  EKG revealed NSR with LBBB (old). CXR was with mild vascular congestion. Labs are significant for negative troponin. Cr of 2.7 (baseline ~2.2).  Takes prn Lasix for edema- has not felt orthopnea or dyspnea so has not taken in a while.   Past Medical History  Diagnosis Date  . Hyperlipidemia   . Hypothyroidism   . Arthritis   . Anemia   . Asthma   . Anxiety   . IBS (irritable bowel syndrome)   . Gout   . Colon polyps   . Tubulovillous adenoma 4/07  . Hemorrhoids   . Diverticulosis   . Chronic diarrhea   . Chronic kidney disease stage III (GFR 30-59 ml/min)   . Hypertensive heart disease     no significant RAS by MRA 2002- (<30% LRAS)   . Paroxysmal atrial fibrillation 11/01/2013    Intermittent through the years and recurrent in December of 2014 treated with amiodarone;;   . Peripheral vascular disease     123456 LICA, [id number] RICA by doppler 2009    . Benign positional vertigo   . LBBB (left bundle branch block)- new 11/01/13 11/01/2013  . Diastolic dysfunction, left ventricle 11/03/2013     Surgical History:  Past Surgical History  Procedure Laterality Date  . Lumbar disc surgery  2000  . Squamous cell skin cancer removed    . Oophorectomy  1962    right  . Appendectomy    . Low anterior bowel resection  7/08  . Cataract extraction    . Transthoracic echocardiogram  11/03/2013    Echocardiogram: EF 55-60%, mild LVH, elevated bili pressures. Mild aortic valve calcification.  Marland Kitchen Nm myoview ltd  11/02/2013    Negative for ischemia or infarction. EF 60%.     Home Meds: Prior to Admission medications   Medication Sig Start Date End Date Taking? Authorizing Provider  amiodarone (PACERONE) 200 MG tablet Take 200 mg by mouth daily.   Yes Historical Provider, MD  apixaban (ELIQUIS) 2.5 MG TABS tablet Take 1 tablet (2.5 mg total) by mouth 2 (two) times daily. 11/17/13  Yes Luke K Kilroy, PA-C  Cyanocobalamin (VITAMIN B 12 PO) Take 1,000 mcg by mouth every morning.   Yes Historical Provider, MD  furosemide (LASIX) 40 MG tablet Take 40 mg by mouth daily as needed for fluid.   Yes Historical Provider, MD  hydrALAZINE (APRESOLINE) 25 MG tablet Take 37.5 mg by mouth 2 (  two) times daily.   Yes Historical Provider, MD  isosorbide mononitrate (IMDUR) 30 MG 24 hr tablet Take 1 tablet (30 mg total) by mouth daily. 12/30/13  Yes [name], MD  levothyroxine (SYNTHROID, LEVOTHROID) 88 MCG tablet Take 88 mcg by mouth daily before breakfast.   Yes Historical Provider, MD  LORazepam (ATIVAN) 0.5 MG tablet Take 0.5 mg by mouth every 8 (eight) hours as needed for anxiety.   Yes Historical Provider, MD  temazepam (RESTORIL) 30 MG capsule Take 30 mg by mouth at bedtime as needed for sleep.   Yes Historical Provider, MD  hyoscyamine (LEVSIN, ANASPAZ) 0.125 MG tablet Take 1-2 tablets by mouth or under tongue every 4 hours as needed before meals 10/29/13    Ladene Artist, MD    Allergies:  Allergies  Allergen Reactions  . Amoxicillin     REACTION: unspecified  . Codeine Phosphate     REACTION: unspecified  . Colchicine     Severe diarrhea  . Lipitor [Atorvastatin] Other (See Comments)    "makes legs jump all night"  . Penicillins     REACTION: nausea, swelling  . Xifaxan [Rifaximin] Other (See Comments)    unknown    History   Social History  . Marital Status: Widowed    Spouse Name: N/A    Number of Children: N/A  . Years of Education: N/A   Occupational History  . Not on file.   Social History Main Topics  . Smoking status: Never Smoker   . Smokeless tobacco: Never Used     Comment: never used tobacco  . Alcohol Use: No  . Drug Use: No  . Sexual Activity: No   Other Topics Concern  . Not on file   Social History Narrative   She is a widowed mother of 2, one child is deceased. His grandmother of one. She appears to be working out routinely at [employer] using [employer]. She is otherwise quite active.   She does not drink alcohol, does not smoke.     Family History  Problem Relation Age of Onset  . Cancer Mother 74  . Heart attack Father   . Heart disease Brother   . Stroke Maternal Grandmother   . Cancer Maternal Grandfather     Lung cancer  . Stroke Paternal Grandmother   . Cancer Daughter     Review of Systems: General: negative for chills, fever, night sweats or weight changes.  Cardiovascular: see HPI Dermatological: negative for rash Respiratory: negative for cough or wheezing Urologic: negative for hematuria Abdominal: negative for nausea, vomiting, diarrhea, bright red blood per rectum, melena, or hematemesis Neurologic: negative for visual changes, syncope, or dizziness All other systems reviewed and are otherwise negative except as noted above.  Labs:   Lab Results  Component Value Date   WBC 7.4 07/29/2014   HGB 11.8* 07/29/2014   HCT 35.7* 07/29/2014   MCV 96.2 07/29/2014    PLT 248 07/29/2014    Recent Labs Lab 07/29/14 2153  NA 138  K 4.8  CL 101  CO2 22  BUN 55*  CREATININE 2.70*  CALCIUM 8.7  GLUCOSE 111*   No results found for this basename: CKTOTAL, CKMB, TROPONINI,  in the last 72 hours Lab Results  Component Value Date   CHOL 159 11/02/2013   HDL 47 11/02/2013   LDLCALC 90 11/02/2013   TRIG 111 11/02/2013   No results found for this basename: DDIMER    Radiology/Studies:  Dg Chest 2 View  07/29/2014   CLINICAL DATA:  Chest pressure.  EXAM: CHEST  2 VIEW  COMPARISON:  Chest radiograph 11/11/2021 2015  FINDINGS: The lungs are hyperexpanded, with flattening of the hemidiaphragms, compatible with COPD. Mild vascular congestion is noted. There is no evidence of pleural effusion or pneumothorax.  The heart is borderline enlarged. No acute osseous abnormalities are seen. A small hiatal hernia is noted.  IMPRESSION: 1. Findings of COPD.  Lungs otherwise grossly clear. 2. Mild vascular congestion and borderline cardiomegaly. 3. Small hiatal hernia noted.   Electronically Signed   By: Garald Balding M.D.   On: 07/29/2014 23:42    Physical Exam: Blood pressure 200/53, pulse 52, temperature 98.7 F (37.1 C), temperature source Oral, resp. rate 13, SpO2 99.00%. General: Well developed, well nourished, in no acute distress. Head: Normocephalic, atraumatic, sclera non-icteric, nares are without discharge Neck: Supple. Negative for carotid bruits. JVD not elevated. Lungs: Clear bilaterally to auscultation without wheezes, rales, or rhonchi. Breathing is unlabored. Heart: RRR with S1 S2. No murmurs, rubs, or gallops appreciated. Abdomen: Soft, non-tender, non-distended with normoactive bowel sounds. No rebound/guarding. No obvious abdominal masses. Msk:  Strength and tone appear normal for age. Extremities: No edema. No clubbing or cyanosis. Distal pedal pulses are 2+ and equal bilaterally. Neuro: Alert and oriented X 3. Moves all extremities  spontaneously. Psych:  Responds to questions appropriately with a normal affect.   Problem List 1. Chest pain, exertional 2. LBBB (chronic) 3. PAF, on amiodarone and eliquis, currently in sinus 4. Hypertension, currently not well controlled 5. CKD, stage 4 with GFR of ~15 6. Anxiety 7. IBS  ASSESSMENT AND PLAN:  80 y.o. female w/ PMHx significant for PAF on amiodarone, CKD (GFR 15), PVD, LBBB, HFpEF who presented to [facility] on 07/30/2014 with complaints of chest pain with exertion.   Symptoms are concerning for cardiac in nature though re-assuring features include negative biomarkers and she is currently chest pain free. EKG uninterpretable due to LBBB. Also re-assuring that she had a negative stress SPECT 10/2013 with normal EF. Given the extent of her renal dysfunction, a cardiac cath would be high risk for causing renal dysfunction. Due to this and the recent negative stress SPECT I would be in favor of trying to medically manage her presumed CAD with increased long acting nitro. Current cardiac meds include aspirin, hydralazine. Relative bradycardia prevents addition of beta blocker and she is intolerant of statins. No further anticoagulation as she is already on eliquis. Will keep NPO just in case repeat stress is desired by team in the morning.  H/o of PAF, currently in sinus. Continue amiodarone and anticoagulation.  CKD, stage IV. Renally dose medications.  Hypertension is currently poorly controlled. Will provide evening meds, increase nitro.  HFpEF: nl EF by MPI and echo. Cxray suggest mild fluid congestion and she is rather hypertensive but asymptomatic. Will try slight diuresis with small dose IV lasix.  Full code. On full anticoagulation.   Signed, Elias Else, Clydie Dillen C. MD 07/30/2014, 2:28 AM

## 2014-07-30 NOTE — Progress Notes (Signed)
Patient Name: [name] Date of Encounter: 07/30/2014     Active Problems:   Chest pain   Angina of effort    SUBJECTIVE  No further chest pain. Rhythm is sinus bradycardia.  Initial enzymes normal  CURRENT MEDS . amiodarone  200 mg Oral Daily  . apixaban  2.5 mg Oral BID  . [START ON 07/31/2014] aspirin EC  81 mg Oral Daily  . hydrALAZINE  37.5 mg Oral BID  . isosorbide mononitrate  60 mg Oral Daily  . levothyroxine  88 mcg Oral QAC breakfast    OBJECTIVE  Filed Vitals:   07/30/14 0130 07/30/14 0200 07/30/14 0330 07/30/14 0431  BP: 155/42 200/53 150/42 196/45  Pulse: 51 52 102 54  Temp:    98.3 F (36.8 C)  TempSrc:    Oral  Resp: 14 13 17 18   Height:    5\' 4"  (1.626 m)  Weight:    141 lb 14.4 oz (64.365 kg)  SpO2: 97% 99% 96% 99%    Intake/Output Summary (Last 24 hours) at 07/30/14 0837 Last data filed at 07/30/14 0727  Gross per 24 hour  Intake      0 ml  Output   1400 ml  Net  -1400 ml   Filed Weights   07/30/14 0431  Weight: 141 lb 14.4 oz (64.365 kg)    PHYSICAL EXAM  General: Pleasant, NAD. Neuro: Alert and oriented X 3. Moves all extremities spontaneously. Psych: Normal affect. HEENT:  Normal  Neck: Supple without bruits or JVD. Lungs:  Resp regular and unlabored, CTA. Heart: RRR no s3, s4, or murmurs.Paradoxical splitting of S2 Abdomen: Soft, non-tender, non-distended, BS + x 4.  Extremities: No clubbing, cyanosis or edema. DP/PT/Radials 2+ and equal bilaterally.  Accessory Clinical Findings  CBC  Recent Labs  07/29/14 2153  WBC 7.4  HGB 11.8*  HCT 35.7*  MCV 96.2  PLT Q000111Q   Basic Metabolic Panel  Recent Labs  07/29/14 2153 07/30/14 0513  NA 138 140  K 4.8 4.6  CL 101 103  CO2 22 24  GLUCOSE 111* 94  BUN 55* 57*  CREATININE 2.70* 2.68*  CALCIUM 8.7 8.4   Liver Function Tests No results found for this basename: AST, ALT, ALKPHOS, BILITOT, PROT, ALBUMIN,  in the last 72 hours No results found for this  basename: LIPASE, AMYLASE,  in the last 72 hours Cardiac Enzymes  Recent Labs  07/30/14 0513  TROPONINI <0.30   BNP No components found with this basename: POCBNP,  D-Dimer No results found for this basename: DDIMER,  in the last 72 hours Hemoglobin A1C No results found for this basename: HGBA1C,  in the last 72 hours Fasting Lipid Panel No results found for this basename: CHOL, HDL, LDLCALC, TRIG, CHOLHDL, LDLDIRECT,  in the last 72 hours Thyroid Function Tests No results found for this basename: TSH, T4TOTAL, FREET3, T3FREE, THYROIDAB,  in the last 72 hours  TELE  Sinus bradycardia.  ECG  LBBB  Radiology/Studies  Dg Chest 2 View  07/29/2014   CLINICAL DATA:  Chest pressure.  EXAM: CHEST  2 VIEW  COMPARISON:  Chest radiograph 11/11/2021 2015  FINDINGS: The lungs are hyperexpanded, with flattening of the hemidiaphragms, compatible with COPD. Mild vascular congestion is noted. There is no evidence of pleural effusion or pneumothorax.  The heart is borderline enlarged. No acute osseous abnormalities are seen. A small hiatal hernia is noted.  IMPRESSION: 1. Findings of COPD.  Lungs otherwise grossly clear. 2. Mild  vascular congestion and borderline cardiomegaly. 3. Small hiatal hernia noted.   Electronically Signed   By: Garald Balding M.D.   On: 07/29/2014 23:42    ASSESSMENT AND PLAN 1. Chest pain. Medical management. Adding nitrates today. 2. CKD stage 4 3. LBBB 4  Hypertension. 5. Paroxysmal AF currently NSR, on apixiban and amiodarone. 6. HFpEF. Dyspnea improved.  Plan: No stress test indicated. Resume diet. Ambulate today and see how she does on new medical Rx. Probably home in am if stable.  Signed, Darlin Coco MD

## 2014-07-31 DIAGNOSIS — N184 Chronic kidney disease, stage 4 (severe): Secondary | ICD-10-CM

## 2014-07-31 DIAGNOSIS — R079 Chest pain, unspecified: Secondary | ICD-10-CM

## 2014-07-31 DIAGNOSIS — I200 Unstable angina: Secondary | ICD-10-CM

## 2014-07-31 LAB — BASIC METABOLIC PANEL
Anion gap: 11 (ref 5–15)
BUN: 51 mg/dL — AB (ref 6–23)
CHLORIDE: 103 meq/L (ref 96–112)
CO2: 26 mEq/L (ref 19–32)
Calcium: 8.3 mg/dL — ABNORMAL LOW (ref 8.4–10.5)
Creatinine, Ser: 2.4 mg/dL — ABNORMAL HIGH (ref 0.50–1.10)
GFR calc Af Amer: 20 mL/min — ABNORMAL LOW (ref >90)
GFR calc non Af Amer: 17 mL/min — ABNORMAL LOW (ref >90)
Glucose, Bld: 98 mg/dL (ref 70–99)
POTASSIUM: 4.3 meq/L (ref 3.7–5.3)
SODIUM: 140 meq/L (ref 137–147)

## 2014-07-31 MED ORDER — ISOSORBIDE MONONITRATE ER 60 MG PO TB24: 60.0000 mg | ORAL_TABLET | Freq: Every day | ORAL | Status: DC

## 2014-07-31 MED ORDER — HYDRALAZINE HCL 25 MG PO TABS: 37.5000 mg | ORAL_TABLET | Freq: Three times a day (TID) | ORAL | Status: DC

## 2014-07-31 MED ORDER — HYDRALAZINE HCL 25 MG PO TABS
37.5000 mg | ORAL_TABLET | Freq: Three times a day (TID) | ORAL | Status: DC
  Administered 2014-07-31: 37.5 mg via ORAL
  Filled 2014-07-31 (×3): qty 1.5

## 2014-07-31 MED ORDER — NITROGLYCERIN 0.4 MG SL SUBL: 0.4000 mg | SUBLINGUAL_TABLET | SUBLINGUAL | Status: DC | PRN

## 2014-07-31 NOTE — Discharge Instructions (Signed)
***  PLEASE REMEMBER TO BRING ALL OF YOUR MEDICATIONS TO EACH OF YOUR FOLLOW-UP OFFICE VISITS.  

## 2014-07-31 NOTE — Discharge Summary (Signed)
Discharge Summary   Patient ID: [name],  MRN: [id number], DOB/AGE: [birth year] 80 y.o.  Admit date: 07/29/2014 Discharge date: 07/31/2014  Primary Care Provider: [name] A Primary Cardiologist: Roni Bread, MD  Discharge Diagnoses Principal Problem:   Unstable angina  **Negative cardiac markers this admission.  Medically managed with titration of Isosorbide therapy.  Active Problems:   Chronic diastolic heart failure  **Minus 2.4 Liters this admission.  **Discharge wt 137 lbs.   Hypertensive heart disease   Paroxysmal atrial fibrillation  **Chronic amio and renal-adjusted eliquis.   Chronic anemia   GERD (gastroesophageal reflux disease)   Chronic anticoagulation- Eliquis   Gout   CKD (chronic kidney disease), stage IV  Allergies Allergies  Allergen Reactions  . Amoxicillin     REACTION: unspecified  . Codeine Phosphate     REACTION: unspecified  . Colchicine     Severe diarrhea  . Lipitor [Atorvastatin] Other (See Comments)    "makes legs jump all night"  . Penicillins     REACTION: nausea, swelling  . Xifaxan [Rifaximin] Other (See Comments)    unknown   Procedures  None  History of Present Illness  80 y/o female with the above complex problem list.  She was in her usual state of health until 2 days prior to admission, when she began to experience intermittent exertional chest discomfort.  She presented to Urgent Care on 9/18 and was advised to present to the ED.  She presented to [facility], where ECG showed chronic LBBB and troponin was normal.  CXR showed mild vascular congesion and she was treated with a dose of IV lasix.  She was admitted for further evaluation.  Hospital Course  Patient ruled out for MI and her imdur was titrated from 30 to 60mg  daily.  She had a negative myoview in 10/2013 and is not felt to be a good candidate for catheterization secondary to CKD IV.  She had no further chest discomfort and has been able to ambulate without  symptoms.  Her blood pressure has been trending in the 160's and her hydralazine was increased from 37.5mg  BID to 37.5mg  TID.  She will be discharged home today in good condition and we will arrange for outpatient follow-up within the next two weeks.  Discharge Vitals Blood pressure 161/49, pulse 46, temperature 98 F (36.7 C), temperature source Oral, resp. rate 16, height 5\' 4"  (1.626 m), weight 137 lb 14.4 oz (62.551 kg), SpO2 97.00%.  Filed Weights   07/30/14 0431 07/31/14 0650  Weight: 141 lb 14.4 oz (64.365 kg) 137 lb 14.4 oz (62.551 kg)   Labs  CBC  Recent Labs  07/29/14 2153  WBC 7.4  HGB 11.8*  HCT 35.7*  MCV 96.2  PLT Q000111Q   Basic Metabolic Panel  Recent Labs  07/30/14 0513 07/31/14 0547  NA 140 140  K 4.6 4.3  CL 103 103  CO2 24 26  GLUCOSE 94 98  BUN 57* 51*  CREATININE 2.68* 2.40*  CALCIUM 8.4 8.3*   Cardiac Enzymes  Recent Labs  07/30/14 0513 07/30/14 1032 07/30/14 1635  TROPONINI <0.30 <0.30 <0.30   Disposition  Pt is being discharged home today in good condition.  Follow-up Plans & Appointments      Follow-up Information   Follow up with [name], MD On 10/03/2014. (11:00 AM)    Specialty:  Cardiology   Contact information:   [address] [place] [address] [phone]       Follow up  with [name], MD. (as scheduled)    Specialty:  Family Medicine   Contact information:   [place] [address] [phone]       Follow up with [name], MD. (we will arrange for f/u within the next 2 wks and contact you.)    Specialty:  Cardiology   Contact information:   [place] [address] [phone]      Discharge Medications    Medication List         amiodarone 200 MG tablet  Commonly known as:  PACERONE  Take 200 mg by mouth daily.     apixaban 2.5 MG Tabs tablet  Commonly known as:  ELIQUIS  Take 1 tablet (2.5 mg total) by mouth 2  (two) times daily.     furosemide 40 MG tablet  Commonly known as:  LASIX  Take 40 mg by mouth daily as needed for fluid.     hydrALAZINE 25 MG tablet  Commonly known as:  APRESOLINE  Take 1.5 tablets (37.5 mg total) by mouth 3 (three) times daily.     hyoscyamine 0.125 MG tablet  Commonly known as:  LEVSIN, ANASPAZ  Take 1-2 tablets by mouth or under tongue every 4 hours as needed before meals     isosorbide mononitrate 60 MG 24 hr tablet  Commonly known as:  IMDUR  Take 1 tablet (60 mg total) by mouth daily.     levothyroxine 88 MCG tablet  Commonly known as:  SYNTHROID, LEVOTHROID  Take 88 mcg by mouth daily before breakfast.     LORazepam 0.5 MG tablet  Commonly known as:  ATIVAN  Take 0.5 mg by mouth every 8 (eight) hours as needed for anxiety.     nitroGLYCERIN 0.4 MG SL tablet  Commonly known as:  NITROSTAT  Place 1 tablet (0.4 mg total) under the tongue every 5 (five) minutes x 3 doses as needed for chest pain.     temazepam 30 MG capsule  Commonly known as:  RESTORIL  Take 30 mg by mouth at bedtime as needed for sleep.     VITAMIN B 12 PO  Take 1,000 mcg by mouth every morning.       Outstanding Labs/Studies  None  Duration of Discharge Encounter   Greater than 30 minutes including physician time.  SignedMurray Hodgkins NP 07/31/2014, 9:09 AM

## 2014-07-31 NOTE — Progress Notes (Signed)
Patient Name: [name] Date of Encounter: 07/31/2014     Active Problems:   Chest pain   Angina of effort    SUBJECTIVE  Patient feels well. No further chest pain. Anxious to go home. Systolic pressure still high. Will increase hydralazine to TID. Tolerating Imdur.  CURRENT MEDS . amiodarone  200 mg Oral Daily  . apixaban  2.5 mg Oral BID  . aspirin EC  81 mg Oral Daily  . hydrALAZINE  37.5 mg Oral TID  . isosorbide mononitrate  60 mg Oral Daily  . levothyroxine  88 mcg Oral QAC breakfast    OBJECTIVE  Filed Vitals:   07/30/14 0431 07/30/14 1352 07/30/14 2201 07/31/14 0650  BP: 196/45 169/45 165/48 161/49  Pulse: 54 54 58 46  Temp: 98.3 F (36.8 C) 98.2 F (36.8 C) 98.2 F (36.8 C) 98 F (36.7 C)  TempSrc: Oral Oral Oral Oral  Resp: 18 18 20 16   Height: 5\' 4"  (1.626 m)     Weight: 141 lb 14.4 oz (64.365 kg)   137 lb 14.4 oz (62.551 kg)  SpO2: 99%  98% 97%    Intake/Output Summary (Last 24 hours) at 07/31/14 0843 Last data filed at 07/30/14 2031  Gross per 24 hour  Intake    360 ml  Output   1375 ml  Net  -1015 ml   Filed Weights   07/30/14 0431 07/31/14 0650  Weight: 141 lb 14.4 oz (64.365 kg) 137 lb 14.4 oz (62.551 kg)    PHYSICAL EXAM  General: Pleasant, NAD. Neuro: Alert and oriented X 3. Moves all extremities spontaneously. Psych: Normal affect. HEENT:  Normal  Neck: Supple without bruits or JVD. Lungs:  Resp regular and unlabored, CTA. Heart: RRR no s3, s4, or murmurs. Paradoxic S2 Abdomen: Soft, non-tender, non-distended, BS + x 4.  Extremities: No clubbing, cyanosis or edema. DP/PT/Radials 2+ and equal bilaterally.  Accessory Clinical Findings  CBC  Recent Labs  07/29/14 2153  WBC 7.4  HGB 11.8*  HCT 35.7*  MCV 96.2  PLT Q000111Q   Basic Metabolic Panel  Recent Labs  07/30/14 0513 07/31/14 0547  NA 140 140  K 4.6 4.3  CL 103 103  CO2 24 26  GLUCOSE 94 98  BUN 57* 51*  CREATININE 2.68* 2.40*  CALCIUM 8.4 8.3*    Liver Function Tests No results found for this basename: AST, ALT, ALKPHOS, BILITOT, PROT, ALBUMIN,  in the last 72 hours No results found for this basename: LIPASE, AMYLASE,  in the last 72 hours Cardiac Enzymes  Recent Labs  07/30/14 0513 07/30/14 1032 07/30/14 1635  TROPONINI <0.30 <0.30 <0.30   BNP No components found with this basename: POCBNP,  D-Dimer No results found for this basename: DDIMER,  in the last 72 hours Hemoglobin A1C No results found for this basename: HGBA1C,  in the last 72 hours Fasting Lipid Panel No results found for this basename: CHOL, HDL, LDLCALC, TRIG, CHOLHDL, LDLDIRECT,  in the last 72 hours Thyroid Function Tests No results found for this basename: TSH, T4TOTAL, FREET3, T3FREE, THYROIDAB,  in the last 72 hours  TELE  Sinus bradycardia.  ECG    Radiology/Studies  Dg Chest 2 View  07/29/2014   CLINICAL DATA:  Chest pressure.  EXAM: CHEST  2 VIEW  COMPARISON:  Chest radiograph 11/11/2021 2015  FINDINGS: The lungs are hyperexpanded, with flattening of the hemidiaphragms, compatible with COPD. Mild vascular congestion is noted. There is no evidence of pleural effusion or  pneumothorax.  The heart is borderline enlarged. No acute osseous abnormalities are seen. A small hiatal hernia is noted.  IMPRESSION: 1. Findings of COPD.  Lungs otherwise grossly clear. 2. Mild vascular congestion and borderline cardiomegaly. 3. Small hiatal hernia noted.   Electronically Signed   By: Garald Balding M.D.   On: 07/29/2014 23:42    ASSESSMENT AND PLAN 1. Chest pain. Medical management. Imdur added this admission. 2. CKD stage 4  3. LBBB  4 Hypertension. Will increase hydralazine to TID 5. Paroxysmal AF currently NSR, on apixiban and amiodarone.  6. HFpEF. Dyspnea improved.  Plan: Discharge home today.  Followup in next week or two with Dr. Ellyn Hack.  Signed, Darlin Coco MD

## 2014-08-01 ENCOUNTER — Telehealth: Payer: Self-pay | Admitting: Cardiology

## 2014-08-01 ENCOUNTER — Other Ambulatory Visit: Payer: Self-pay | Admitting: Cardiology

## 2014-08-01 NOTE — Telephone Encounter (Signed)
Closed encounter °

## 2014-08-02 ENCOUNTER — Telehealth: Payer: Self-pay | Admitting: Cardiology

## 2014-08-02 NOTE — Telephone Encounter (Signed)
TCM phone call. Left message on answering machine.  Kerin Ransom PA-C 08/02/2014 5:20 PM

## 2014-08-15 ENCOUNTER — Encounter: Payer: Self-pay | Admitting: Cardiology

## 2014-08-15 ENCOUNTER — Ambulatory Visit (INDEPENDENT_AMBULATORY_CARE_PROVIDER_SITE_OTHER): Payer: Medicare Other | Admitting: Cardiology

## 2014-08-15 VITALS — BP 186/82 | HR 60 | Ht 64.0 in | Wt 143.6 lb

## 2014-08-15 DIAGNOSIS — R251 Tremor, unspecified: Secondary | ICD-10-CM

## 2014-08-15 DIAGNOSIS — I480 Paroxysmal atrial fibrillation: Secondary | ICD-10-CM

## 2014-08-15 DIAGNOSIS — E785 Hyperlipidemia, unspecified: Secondary | ICD-10-CM

## 2014-08-15 DIAGNOSIS — I10 Essential (primary) hypertension: Secondary | ICD-10-CM

## 2014-08-15 DIAGNOSIS — I447 Left bundle-branch block, unspecified: Secondary | ICD-10-CM

## 2014-08-15 DIAGNOSIS — I200 Unstable angina: Secondary | ICD-10-CM

## 2014-08-15 NOTE — Assessment & Plan Note (Signed)
BP elevated on arrival, but did improve.  Continue meds, discussed importance of middle dose of hydrazine.

## 2014-08-15 NOTE — Patient Instructions (Addendum)
Your physician recommends that you keep the scheduled  follow-up appointment in November with Dr. Ellyn Hack unless you develop SOB. If this occurs then call office to notify. No changes have been made today in your therapy.  Your physician recommends that you schedule a follow-up appointment for a blood pressure appointment with Cyril Mourning the week of the 20th.

## 2014-08-15 NOTE — Assessment & Plan Note (Signed)
Maintaining SR on amiodarone 200 mg daily.

## 2014-08-15 NOTE — Assessment & Plan Note (Signed)
Recent hospitalization, meds adjusted, no MI. No further pain.

## 2014-08-15 NOTE — Progress Notes (Signed)
08/15/2014   PCP: [name], MD   Chief Complaint  Patient presents with  . post hospital    patient reports no problems since leaving the hospital    Primary Cardiologist:Dr. Roni Bread   HPI:  80 year old female presents today after hospitalization for [place].  Her meds were titrated up- hydralazine to TID.  She has HTN, chronic diastolic HF, PAF maintaining SR on amiodarone and anticoagulation on Eliquis.  She has stage IV kidney disease.  She has done well since discharge.  No chest pain, no SOB.  She has no complaints today.  She is concerned she will loose her deposite travel in October so we will have her come in for BP check beforehand.     She has had some trouble remembering to take her middle dose of hydralazine during the day.  Her BP was initially elevated on recheck it was improved.       Allergies  Allergen Reactions  . Amoxicillin     REACTION: unspecified  . Codeine Phosphate     REACTION: unspecified  . Colchicine     Severe diarrhea  . Lipitor [Atorvastatin] Other (See Comments)    "makes legs jump all night"  . Penicillins     REACTION: nausea, swelling  . Xifaxan [Rifaximin] Other (See Comments)    unknown    Current Outpatient Prescriptions  Medication Sig Dispense Refill  . amiodarone (PACERONE) 200 MG tablet Take 200 mg by mouth daily.      . Cyanocobalamin (VITAMIN B 12 PO) Take 1,000 mcg by mouth every morning.      Marland Kitchen ELIQUIS 2.5 MG TABS tablet TAKE 1 TABLET (2.5 MG TOTAL) BY MOUTH 2 TIMES DAILY.  60 tablet  3  . furosemide (LASIX) 40 MG tablet Take 40 mg by mouth daily as needed for fluid.      . hydrALAZINE (APRESOLINE) 25 MG tablet Take 1.5 tablets (37.5 mg total) by mouth 3 (three) times daily.  135 tablet  6  . hyoscyamine (LEVSIN, ANASPAZ) 0.125 MG tablet Take 1-2 tablets by mouth or under tongue every 4 hours as needed before meals  360 tablet  5  . isosorbide mononitrate (IMDUR) 60 MG 24 hr tablet Take 1 tablet (60 mg  total) by mouth daily.  30 tablet  6  . levothyroxine (SYNTHROID, LEVOTHROID) 88 MCG tablet Take 88 mcg by mouth daily before breakfast.      . LORazepam (ATIVAN) 0.5 MG tablet Take 0.5 mg by mouth every 8 (eight) hours as needed for anxiety.      . nitroGLYCERIN (NITROSTAT) 0.4 MG SL tablet Place 1 tablet (0.4 mg total) under the tongue every 5 (five) minutes x 3 doses as needed for chest pain.  25 tablet  3  . temazepam (RESTORIL) 30 MG capsule Take 30 mg by mouth at bedtime as needed for sleep.       No current facility-administered medications for this visit.    Past Medical History  Diagnosis Date  . Hyperlipidemia   . Hypothyroidism   . Arthritis   . Anemia   . Asthma   . Anxiety   . IBS (irritable bowel syndrome)   . Gout   . Colon polyps   . Tubulovillous adenoma 4/07  . Hemorrhoids   . Diverticulosis   . Chronic diarrhea   . Chronic kidney disease stage III (GFR 30-59 ml/min)   . Hypertensive heart disease  no significant RAS by MRA 2002- (<30% LRAS)   . Paroxysmal atrial fibrillation 11/01/2013    Intermittent through the years and recurrent in December of 2014 treated with amiodarone;;   . Peripheral vascular disease     123456 LICA, [id number] RICA by doppler 2009   . Benign positional vertigo   . LBBB (left bundle branch block)- new 11/01/13 11/01/2013  . Diastolic dysfunction, left ventricle 11/03/2013    Past Surgical History  Procedure Laterality Date  . Lumbar disc surgery  2000  . Squamous cell skin cancer removed    . Oophorectomy  1962    right  . Appendectomy    . Low anterior bowel resection  7/08  . Cataract extraction    . Transthoracic echocardiogram  11/03/2013    Echocardiogram: EF 55-60%, mild LVH, elevated bili pressures. Mild aortic valve calcification.  Marland Kitchen Nm myoview ltd  11/02/2013    Negative for ischemia or infarction. EF 60%.    [id number] colds or fevers, no weight changes Skin:no rashes or ulcers HEENT:no blurred vision, no  congestion CV:see HPI PUL:see HPI GI:no diarrhea constipation or melena, no indigestion GU:no hematuria, no dysuria MS:no joint pain, no claudication Neuro:no syncope, no lightheadedness Endo:no diabetes, no thyroid disease  Wt Readings from Last 3 Encounters:  08/15/14 143 lb 9.6 oz (65.137 kg)  07/31/14 137 lb 14.4 oz (62.551 kg)  07/26/14 141 lb 1.6 oz (64.003 kg)    PHYSICAL EXAM BP 186/82  Pulse 60  Ht 5\' 4"  (1.626 m)  Wt 143 lb 9.6 oz (65.137 kg)  BMI 24.64 kg/m2  Recheck BP 148/88  Her BP cuff is much lower  General:Pleasant affect, NAD Skin:Warm and dry, brisk capillary refill HEENT:normocephalic, sclera clear, mucus membranes moist Neck:supple, no JVD, no bruits  Heart:S1S2 RRR with soft systolic murmur, no gallup, rub or click Lungs:clear without rales, rhonchi, or wheezes [id number], non tender, + BS, do not palpate liver spleen or masses Ext:no lower ext edema, 2+ pedal pulses, 2+ radial pulses Neuro:alert and oriented, MAE, follows commands, + facial symmetry  EKG:SR Q wave in III-old.  No acute changes.  ASSESSMENT AND PLAN Unstable angina Recent hospitalization, meds adjusted, no MI. No further pain.  Hyperlipidemia stable  HTN (hypertension) BP elevated on arrival, but did improve.  Continue meds, discussed importance of middle dose of hydrazine.   Paroxysmal atrial fibrillation Maintaining SR on amiodarone 200 mg daily.  Tremor Will discuss with PCP and Dr. Roni Bread   LBBB (left bundle branch block)- new 11/01/13 chronic   Will follow up for BP check and Dr. Roni Bread as previously arranged.

## 2014-08-15 NOTE — Assessment & Plan Note (Signed)
Will discuss with PCP and Dr. Roni Bread

## 2014-08-15 NOTE — Assessment & Plan Note (Signed)
chronic

## 2014-08-15 NOTE — Assessment & Plan Note (Addendum)
stable °

## 2014-08-17 ENCOUNTER — Telehealth: Payer: Self-pay | Admitting: Cardiology

## 2014-08-17 NOTE — Telephone Encounter (Signed)
Have her take 50 mg hydralazine TID this may help

## 2014-08-17 NOTE — Telephone Encounter (Signed)
Pt called in stating that she was in to see Mickel Baas on 10/5 and her BP was elevated in the 200's and Mickel Baas advised her to continue to take her BP meds. Since then her BP has continued to fluctuate and she is unsure of what to do. Please call  thanks

## 2014-08-17 NOTE — Telephone Encounter (Signed)
Spoke with patient. She reports that her BP yesterday was 212/82 and gradually decreased throughout the day ([id number] systolic around 3pm and [id number] around 945pm). Her HR yesterday was in low 50s. She reports she did not feel well yesterday. She denies chest pain/SOB.   Today BP is 217/66 and HR 46.  I reviewed her medications with her to ensure she was taking as directed (as it was noted she was missing her mid-day dose of hydralazine on occasion)  She is scheduled to see Erasmo Downer for BP check on 10/23 (and is going on a vacation early Nov).   She is concerned and discouraged about her BP.   She was advised to continue to monitor BP/HR and record numbers and bring with her to [place].   Message will be deferred to Mickel Baas, NP and Erasmo Downer PharmD to advise on BP/possible med adjustments

## 2014-08-17 NOTE — Telephone Encounter (Signed)
Instructed patient to increase medication per Laura's instruction. Patient voiced understanding. Medication list updated.

## 2014-08-18 ENCOUNTER — Telehealth: Payer: Self-pay | Admitting: Cardiology

## 2014-08-18 NOTE — Telephone Encounter (Signed)
[name] is calling because she was told to increase her bp medication to 2 tablets 3 times a day , but her Bl;ood pressure is still high .Marland Kitchen It was 217/66 this morning, at 12 noon it was 197/92 ,this afternoon it was 207/76 at 2pm she says she is doing what she was told and is very concern . Please Call    Thanks

## 2014-08-18 NOTE — Telephone Encounter (Signed)
Called patient and informed her per Angeline Slim to take a extra 50 mg of hydralazine now.  Keep appointment with [facility] tomorrow morning.

## 2014-08-18 NOTE — Telephone Encounter (Signed)
Patient called with complaints of her blood pressure being elevated. Denies chest pain, dizziness, SOB. Just anxious when taking her blood pressure hoping that it is down. States that she increased the medication has instructed by Mickel Baas 3 days ago. Recommended for patient to take her anxiety medication since she has not taken it today yet. Appointment made to follow up with [facility] tomorrow morning @ 8:oo. Am if she develops any of the above mentioned symptoms she is to seek emergent medical attention. Meantime I will contact a provider for further instructions.patient voiced understanding of recommendations.

## 2014-08-19 ENCOUNTER — Ambulatory Visit (INDEPENDENT_AMBULATORY_CARE_PROVIDER_SITE_OTHER): Payer: Medicare Other | Admitting: Pharmacist Clinician (PhC)/ Clinical Pharmacy Specialist

## 2014-08-19 VITALS — BP 180/60 | HR 56 | Ht 64.0 in | Wt 142.0 lb

## 2014-08-19 DIAGNOSIS — I10 Essential (primary) hypertension: Secondary | ICD-10-CM

## 2014-08-19 MED ORDER — AMLODIPINE BESYLATE 2.5 MG PO TABS: 2.5000 mg | ORAL_TABLET | Freq: Every day | ORAL | Status: DC

## 2014-08-19 MED ORDER — HYDRALAZINE HCL 50 MG PO TABS: 50.0000 mg | ORAL_TABLET | Freq: Three times a day (TID) | ORAL | Status: DC

## 2014-08-19 NOTE — Progress Notes (Signed)
08/19/2014 [name] 09/15/1924 [id number]   HPI:  [name] is a 80 y.o. female patient of [name], with a PMH below who presents today for hypertension clinic evaluation.  [name] has suffered from hypertension for 30 or more years and states has been on many different medications over the years.  Currently she is poorly controlled, only on hydralazine 50mg  tid.  She has Stage IV kidney disease, which limits use of many medications.   She was recently discharged from the hospital with a diagnosis of unstable angina and started on isosorbide.  She called into the office earlier this week with concerns of home BP readings as high as [id number] systolic and 94 diastolic.  She has been taking her blood pressure multiple times per day and becomes more anxious with each reading.  She was advised yesterday to take an extra 50mg  of hydralazine as well as one of her anxiety tablets.    Her history is significant for diastolic dysfunction and chronic kidney disease.   Her father died from an MI at the age of 16 and a brother from an aneurysm.    She does not drink alcohol or caffeine, has never used tobacco and only adds minimal salt to her home cooking.  She does admit to eating out about 1/2 of her meals.  Previously she was going to the [facility] for exercise classes three times per week, but has not been since her hospital discharge about 2 weeks ago.  She hopes to get back next week.    Her home BP cuff is an [employer], about 56-65 years old, but she did not bring it into the office today.  She states she compared it to a neighbors cuff and they were within 5-10 points.  According to the readings she brings in today, she has been checking her pressure 3-6 times each day.    Current Outpatient Prescriptions  Medication Sig Dispense Refill  . amiodarone (PACERONE) 200 MG tablet Take 200 mg by mouth daily.      Marland Kitchen amLODipine (NORVASC) 2.5 MG tablet Take 1 tablet (2.5 mg total) by mouth daily.  30 tablet   3  . Cyanocobalamin (VITAMIN B 12 PO) Take 1,000 mcg by mouth every morning.      Marland Kitchen ELIQUIS 2.5 MG TABS tablet TAKE 1 TABLET (2.5 MG TOTAL) BY MOUTH 2 TIMES DAILY.  60 tablet  3  . furosemide (LASIX) 40 MG tablet Take 40 mg by mouth daily as needed for fluid.      . hydrALAZINE (APRESOLINE) 50 MG tablet Take 1 tablet (50 mg total) by mouth 3 (three) times daily.  90 tablet  3  . hyoscyamine (LEVSIN, ANASPAZ) 0.125 MG tablet Take 1-2 tablets by mouth or under tongue every 4 hours as needed before meals  360 tablet  5  . isosorbide mononitrate (IMDUR) 60 MG 24 hr tablet Take 1 tablet (60 mg total) by mouth daily.  30 tablet  6  . levothyroxine (SYNTHROID, LEVOTHROID) 88 MCG tablet Take 88 mcg by mouth daily before breakfast.      . LORazepam (ATIVAN) 0.5 MG tablet Take 0.5 mg by mouth every 8 (eight) hours as needed for anxiety.      . nitroGLYCERIN (NITROSTAT) 0.4 MG SL tablet Place 1 tablet (0.4 mg total) under the tongue every 5 (five) minutes x 3 doses as needed for chest pain.  25 tablet  3  . temazepam (RESTORIL) 30 MG capsule Take  30 mg by mouth at bedtime as needed for sleep.       No current facility-administered medications for this visit.    Allergies  Allergen Reactions  . Amoxicillin     REACTION: unspecified  . Codeine Phosphate     REACTION: unspecified  . Colchicine     Severe diarrhea  . Lipitor [Atorvastatin] Other (See Comments)    "makes legs jump all night"  . Penicillins     REACTION: nausea, swelling  . Xifaxan [Rifaximin] Other (See Comments)    unknown    Past Medical History  Diagnosis Date  . Hyperlipidemia   . Hypothyroidism   . Arthritis   . Anemia   . Asthma   . Anxiety   . IBS (irritable bowel syndrome)   . Gout   . Colon polyps   . Tubulovillous adenoma 4/07  . Hemorrhoids   . Diverticulosis   . Chronic diarrhea   . Chronic kidney disease stage III (GFR 30-59 ml/min)   . Hypertensive heart disease     no significant RAS by MRA 2002- (<30%  LRAS)   . Paroxysmal atrial fibrillation 11/01/2013    Intermittent through the years and recurrent in December of 2014 treated with amiodarone;;   . Peripheral vascular disease     123456 LICA, [id number] RICA by doppler 2009   . Benign positional vertigo   . LBBB (left bundle branch block)- new 11/01/13 11/01/2013  . Diastolic dysfunction, left ventricle 11/03/2013    Blood pressure 180/60, pulse 56, height 5\' 4"  (1.626 m), weight 142 lb (64.411 kg).  Standing 150/58,  R arm 160/62   ASSESSMENT AND PLAN:  Today her BP remains elevated.  She has had some trouble with middle of the day doses of hydralazine, so we talked about some possible daytime triggers to help her remember.  I assured her it is fine to take it at lunch, even if only 3-4 hours after morning dose or at dinner, because her evening dose is usually around 11pm.  Today I will add low dose amlodipine 2.5mg .  She dis have a 30 point drop in BP when she stood, although she reports no dizziness or lightheadeness when standing.   She was encouraged to resume her [employer] exercise class and to only check her BP twice daily, as I believe some of the increase could have been due to anxiety over first high reading.  I asked her to take an anxiety tablet if she feels it's needed, when her pressure starts to go up, as this might also help with the high readings.  I will see her back in 3 weeks and have asked her to bring her BP cuff with her to that appointment.  Tommy Medal PharmD CPP [facility]

## 2014-08-19 NOTE — Patient Instructions (Signed)
Return for a a follow up appointment in 1 month   Your blood pressure today is 180/60  Check your blood pressure at home no more than twice daily and keep record of the readings.  Take your BP meds as follows - continue with hydralazine 50mg  three times daily (will give you 50mg  tablet) and add amlodipine 2.5mg  each morning  Bring all of your meds, your BP cuff and your record of home blood pressures to your next appointment.  Exercise as you're able, try to walk approximately 30 minutes per day.  Keep salt intake to a minimum, especially watch canned and prepared boxed foods.  Eat more fresh fruits and vegetables and fewer canned items.  Avoid eating in fast food restaurants.    HOW TO TAKE YOUR BLOOD PRESSURE:   Rest 5 minutes before taking your blood pressure.    Don't smoke or drink caffeinated beverages for at least 30 minutes before.   Take your blood pressure before (not after) you eat.   Sit comfortably with your back supported and both feet on the floor (don't cross your legs).   Elevate your arm to heart level on a table or a desk.   Use the proper sized cuff. It should fit smoothly and snugly around your bare upper arm. There should be enough room to slip a fingertip under the cuff. The bottom edge of the cuff should be 1 inch above the crease of the elbow.   Ideally, take 3 measurements at one sitting and record the average.

## 2014-08-24 ENCOUNTER — Other Ambulatory Visit: Payer: Self-pay | Admitting: Cardiology

## 2014-08-24 NOTE — Telephone Encounter (Signed)
Rx was sent to pharmacy electronically. 

## 2014-08-26 ENCOUNTER — Telehealth: Payer: Self-pay | Admitting: Cardiology

## 2014-08-26 NOTE — Telephone Encounter (Signed)
[name] is calling because the prescription that she was prescribe she cannot take it and her bp is still running high , Please call   Thanks

## 2014-08-26 NOTE — Telephone Encounter (Signed)
Returned call to patient. She states she is very discouraged over her BP. She reports she doesn't feel bad, is more upset that her BP is not controlled and she is wanting to go on a trip Nov. 9th and is worried to go if her BP is not controlled. She took amlodipine 2.5mg  for a few days, after it was prescribed on 10/9 but it made her nauseous, so she stopped it. She states she called and left a VM for Kristin on Wednesday but was aware that Erasmo Downer was off work/out of office on Thursdays. The call did not go thru triage. She has been taking lorazepam as needed but has not resumed her exercise at the [facility] as she fell last Saturday in shower and hit her arm which is very bruised.  Reports an AM BP of 123456 [id number] A999333 systolic A999333 [id number] systolic then A999333 at [id number] 10/16 190/76  Will routed to [place] to advise on medications/BP

## 2014-08-26 NOTE — Telephone Encounter (Signed)
Spoke with patient, asked that she try again with amlodipine, this time with dinner each night.  Explained that medications often cause some problems the first several days, she should try to stick it out, as we don't have many options to treat her BP.  She asked about taking at bedtime with snack.  Advised that would be fine as well.  She is to take daily until at least Tuesday and then let us know how she is doing.  Pt voiced understanding.

## 2014-08-31 ENCOUNTER — Telehealth: Payer: Self-pay | Admitting: Pharmacist Clinician (PhC)/ Clinical Pharmacy Specialist

## 2014-08-31 NOTE — Telephone Encounter (Signed)
Pt called, concerned that amlodipine is causing her to not sleep.  Home BP readings still 170-190s/70-80.    Returned call, advised that she switch amlodipine from pm to am to see if this is the cause of lack of sleep.  Her return appt with [place] for later this month.  Reminded her to bring home BP cuff so that we might verify its accuracy.  Pt voiced understanding

## 2014-09-02 ENCOUNTER — Ambulatory Visit (INDEPENDENT_AMBULATORY_CARE_PROVIDER_SITE_OTHER): Payer: Medicare Other

## 2014-09-02 ENCOUNTER — Ambulatory Visit: Payer: Medicare Other | Admitting: Pharmacist Clinician (PhC)/ Clinical Pharmacy Specialist

## 2014-09-02 VITALS — BP 164/58 | HR 60 | Ht 64.0 in | Wt 142.0 lb

## 2014-09-02 DIAGNOSIS — I10 Essential (primary) hypertension: Secondary | ICD-10-CM

## 2014-09-02 NOTE — Progress Notes (Signed)
Patient came by office for B/P check.B/P was shown to [employer] [facility].She advised to continue same medications.Advised schedule appointment with her in 3 weeks for B/P check.

## 2014-09-02 NOTE — Patient Instructions (Signed)
Continue same medications  Schedule appointment with Cyril Mourning in 3 weeks.

## 2014-09-06 ENCOUNTER — Telehealth: Payer: Self-pay | Admitting: Cardiology

## 2014-09-06 NOTE — Telephone Encounter (Signed)
[name] is calling because she is on Amlodipine and is not sleeping have not slept since she has started taking this medication , have tried sleeping pills and still not able to sleep. Please call

## 2014-09-06 NOTE — Telephone Encounter (Signed)
Will forward to [employer]

## 2014-09-06 NOTE — Telephone Encounter (Signed)
Spoke with patient, she states has not slept in 3-4 days.  Advised to hold amlodipine for now, keep appointment this Friday at 11:30.  Pt voiced understanding.

## 2014-09-07 ENCOUNTER — Ambulatory Visit (INDEPENDENT_AMBULATORY_CARE_PROVIDER_SITE_OTHER): Payer: Medicare Other

## 2014-09-07 DIAGNOSIS — Z23 Encounter for immunization: Secondary | ICD-10-CM

## 2014-09-09 ENCOUNTER — Encounter: Payer: Self-pay | Admitting: Pharmacist Clinician (PhC)/ Clinical Pharmacy Specialist

## 2014-09-09 ENCOUNTER — Ambulatory Visit (INDEPENDENT_AMBULATORY_CARE_PROVIDER_SITE_OTHER): Payer: Medicare Other | Admitting: Pharmacist Clinician (PhC)/ Clinical Pharmacy Specialist

## 2014-09-09 VITALS — BP 180/58 | HR 52 | Ht 64.0 in | Wt 143.3 lb

## 2014-09-09 DIAGNOSIS — I10 Essential (primary) hypertension: Secondary | ICD-10-CM

## 2014-09-09 NOTE — Patient Instructions (Signed)
Return for a a follow up appointment with Dr. Ellyn Hack on Nov 23  Your blood pressure today is 180/58  (goal is <150/90)  Check your blood pressure at home daily and keep record of the readings.  Take your BP meds as follows: increase hydralazine to 75mg  three times a day (1.5 tablets)  Bring all of your meds, your BP cuff and your record of home blood pressures to your next appointment.  Exercise as you're able, try to walk approximately 30 minutes per day.  Keep salt intake to a minimum, especially watch canned and prepared boxed foods.  Eat more fresh fruits and vegetables and fewer canned items.  Avoid eating in fast food restaurants.    HOW TO TAKE YOUR BLOOD PRESSURE:   Rest 5 minutes before taking your blood pressure.    Don't smoke or drink caffeinated beverages for at least 30 minutes before.   Take your blood pressure before (not after) you eat.   Sit comfortably with your back supported and both feet on the floor (don't cross your legs).   Elevate your arm to heart level on a table or a desk.   Use the proper sized cuff. It should fit smoothly and snugly around your bare upper arm. There should be enough room to slip a fingertip under the cuff. The bottom edge of the cuff should be 1 inch above the crease of the elbow.   Ideally, take 3 measurements at one sitting and record the average.

## 2014-09-09 NOTE — Progress Notes (Signed)
09/09/2014 [name] Apr 24, 1924 [id number]   HPI:  [name] is a 80 y.o. female patient of [name], with a PMH below who presents today for a repeat hypertension clinic evaluation.  [name] has suffered from hypertension for 30 or more years and states has been on many different medications over the years.  Currently she is poorly controlled, only on hydralazine 50mg  tid.  She has Stage IV kidney disease, which limits use of many medications.  She has been taking her blood pressure multiple times per day and becomes more anxious with each reading.  Since I saw her last she has seen an orthopedist and is having trouble with a torn rotator cuff.  Her R arm is in a sling and has pain with movement.    Her history is significant for diastolic dysfunction and chronic kidney disease.   Her father died from an MI at the age of 20 and a brother from an aneurysm.    She does not drink alcohol or caffeine, has never used tobacco and only adds minimal salt to her home cooking.  She does admit to eating out about 1/2 of her meals.    Her home readings for the past week have been between 175-203/49-59.   Right now she is only on hydralazine 50mg  three times daily.  She states good compliance with the tid regimen.  She was not able to tolerate amlodipine as it caused insomnia.  Her heart rate is too low for beta blockers and because of her low CrCl we want to avoid ACEIs, ARBs ad diuretics.   Current Outpatient Prescriptions  Medication Sig Dispense Refill  . amiodarone (PACERONE) 200 MG tablet Take 200 mg by mouth daily.      Marland Kitchen amiodarone (PACERONE) 200 MG tablet TAKE 1 TABLET BY MOUTH 2 TIMES DAILY.  60 tablet  6  . amLODipine (NORVASC) 2.5 MG tablet Take 1 tablet (2.5 mg total) by mouth daily.  30 tablet  3  . Cyanocobalamin (VITAMIN B 12 PO) Take 1,000 mcg by mouth every morning.      Marland Kitchen ELIQUIS 2.5 MG TABS tablet TAKE 1 TABLET (2.5 MG TOTAL) BY MOUTH 2 TIMES DAILY.  60 tablet  3  .  furosemide (LASIX) 40 MG tablet Take 40 mg by mouth daily as needed for fluid.      . hydrALAZINE (APRESOLINE) 50 MG tablet Take 1 tablet (50 mg total) by mouth 3 (three) times daily.  90 tablet  3  . hyoscyamine (LEVSIN, ANASPAZ) 0.125 MG tablet Take 1-2 tablets by mouth or under tongue every 4 hours as needed before meals  360 tablet  5  . isosorbide mononitrate (IMDUR) 60 MG 24 hr tablet Take 1 tablet (60 mg total) by mouth daily.  30 tablet  6  . levothyroxine (SYNTHROID, LEVOTHROID) 88 MCG tablet Take 88 mcg by mouth daily before breakfast.      . LORazepam (ATIVAN) 0.5 MG tablet Take 0.5 mg by mouth every 8 (eight) hours as needed for anxiety.      . nitroGLYCERIN (NITROSTAT) 0.4 MG SL tablet Place 1 tablet (0.4 mg total) under the tongue every 5 (five) minutes x 3 doses as needed for chest pain.  25 tablet  3  . temazepam (RESTORIL) 30 MG capsule Take 30 mg by mouth at bedtime as needed for sleep.       No current facility-administered medications for this visit.    Allergies  Allergen Reactions  .  Amoxicillin     REACTION: unspecified  . Codeine Phosphate     REACTION: unspecified  . Colchicine     Severe diarrhea  . Lipitor [Atorvastatin] Other (See Comments)    "makes legs jump all night"  . Penicillins     REACTION: nausea, swelling  . Xifaxan [Rifaximin] Other (See Comments)    unknown    Past Medical History  Diagnosis Date  . Hyperlipidemia   . Hypothyroidism   . Arthritis   . Anemia   . Asthma   . Anxiety   . IBS (irritable bowel syndrome)   . Gout   . Colon polyps   . Tubulovillous adenoma 4/07  . Hemorrhoids   . Diverticulosis   . Chronic diarrhea   . Chronic kidney disease stage III (GFR 30-59 ml/min)   . Hypertensive heart disease     no significant RAS by MRA 2002- (<30% LRAS)   . Paroxysmal atrial fibrillation 11/01/2013    Intermittent through the years and recurrent in December of 2014 treated with amiodarone;;   . Peripheral vascular disease      123456 LICA, [id number] RICA by doppler 2009   . Benign positional vertigo   . LBBB (left bundle branch block)- new 11/01/13 11/01/2013  . Diastolic dysfunction, left ventricle 11/03/2013    Blood pressure 180/58, pulse 52, height 5\' 4"  (1.626 m), weight 143 lb 4.8 oz (65 kg).     ASSESSMENT AND PLAN:  Today her BP remains elevated.  She has some anxiety associated with her increased blood pressure.  Unfortunately there are not a lot of choices to help with her pressure.  For now I will increase her hydralazine to 75mg  three times daily.  Her daughter-in-law is with her today and will help her to cut the tablets in half (50mg  x 1.5 tablets).   She will continue to take her blood pressure several times each week and has an appointment with Dr. Gwenlyn Found in about 3 weeks.  I will see her again afterward as needed.  Tommy Medal PharmD CPP [facility]

## 2014-09-26 ENCOUNTER — Ambulatory Visit: Payer: Medicare Other | Admitting: Pharmacist Clinician (PhC)/ Clinical Pharmacy Specialist

## 2014-09-28 ENCOUNTER — Other Ambulatory Visit: Payer: Self-pay | Admitting: Dermatology

## 2014-10-03 ENCOUNTER — Ambulatory Visit (INDEPENDENT_AMBULATORY_CARE_PROVIDER_SITE_OTHER): Payer: Medicare Other | Admitting: Cardiology

## 2014-10-03 VITALS — BP 196/68 | HR 52 | Ht 64.0 in | Wt 142.4 lb

## 2014-10-03 DIAGNOSIS — I5032 Chronic diastolic (congestive) heart failure: Secondary | ICD-10-CM

## 2014-10-03 DIAGNOSIS — I480 Paroxysmal atrial fibrillation: Secondary | ICD-10-CM

## 2014-10-03 DIAGNOSIS — Z8679 Personal history of other diseases of the circulatory system: Secondary | ICD-10-CM

## 2014-10-03 DIAGNOSIS — Z7901 Long term (current) use of anticoagulants: Secondary | ICD-10-CM

## 2014-10-03 DIAGNOSIS — Z79899 Other long term (current) drug therapy: Secondary | ICD-10-CM

## 2014-10-03 DIAGNOSIS — E038 Other specified hypothyroidism: Secondary | ICD-10-CM

## 2014-10-03 DIAGNOSIS — I129 Hypertensive chronic kidney disease with stage 1 through stage 4 chronic kidney disease, or unspecified chronic kidney disease: Secondary | ICD-10-CM

## 2014-10-03 DIAGNOSIS — N184 Chronic kidney disease, stage 4 (severe): Secondary | ICD-10-CM

## 2014-10-03 DIAGNOSIS — I739 Peripheral vascular disease, unspecified: Secondary | ICD-10-CM

## 2014-10-03 DIAGNOSIS — I10 Essential (primary) hypertension: Secondary | ICD-10-CM

## 2014-10-03 DIAGNOSIS — I119 Hypertensive heart disease without heart failure: Secondary | ICD-10-CM

## 2014-10-03 DIAGNOSIS — N183 Chronic kidney disease, stage 3 unspecified: Secondary | ICD-10-CM

## 2014-10-03 MED ORDER — AMIODARONE HCL 200 MG PO TABS: 200.0000 mg | ORAL_TABLET | Freq: Every day | ORAL | Status: DC

## 2014-10-03 MED ORDER — HYDRALAZINE HCL 100 MG PO TABS: 100.0000 mg | ORAL_TABLET | Freq: Three times a day (TID) | ORAL | Status: DC

## 2014-10-03 NOTE — Patient Instructions (Addendum)
START Amiodarone 200 mg daily  Hold today and do not take tomorrow.  Increase Hydralazine - 100 mg three times a day  Labs tsh, liver panel,cmp  Schedule for 12/26/14 9 am.Your physician has recommended that you have a pulmonary function test. Pulmonary Function Tests are a group of tests that measure how well air moves in and out of your lungs.  Your physician wants you to follow-up in Jan 2016 30 MIN APPT.  You will receive a reminder letter in the mail two months in advance. If you don't receive a letter, please call our office to schedule the follow-up appointment.

## 2014-10-04 ENCOUNTER — Telehealth: Payer: Self-pay | Admitting: *Deleted

## 2014-10-04 ENCOUNTER — Encounter: Payer: Self-pay | Admitting: Cardiology

## 2014-10-04 DIAGNOSIS — I10 Essential (primary) hypertension: Secondary | ICD-10-CM | POA: Insufficient documentation

## 2014-10-04 DIAGNOSIS — G834 Cauda equina syndrome: Secondary | ICD-10-CM | POA: Insufficient documentation

## 2014-10-04 LAB — HEPATIC FUNCTION PANEL
ALBUMIN: 4.4 g/dL (ref 3.5–5.2)
ALT: 42 U/L — ABNORMAL HIGH (ref 0–35)
AST: 33 U/L (ref 0–37)
Alkaline Phosphatase: 67 U/L (ref 39–117)
BILIRUBIN DIRECT: 0.1 mg/dL (ref 0.0–0.3)
BILIRUBIN TOTAL: 0.5 mg/dL (ref 0.2–1.2)
Indirect Bilirubin: 0.4 mg/dL (ref 0.2–1.2)
Total Protein: 7 g/dL (ref 6.0–8.3)

## 2014-10-04 LAB — COMPREHENSIVE METABOLIC PANEL
ALT: 42 U/L — ABNORMAL HIGH (ref 0–35)
AST: 33 U/L (ref 0–37)
Albumin: 4.4 g/dL (ref 3.5–5.2)
Alkaline Phosphatase: 67 U/L (ref 39–117)
BUN: 42 mg/dL — ABNORMAL HIGH (ref 6–23)
CALCIUM: 9.3 mg/dL (ref 8.4–10.5)
CO2: 26 meq/L (ref 19–32)
Chloride: 103 mEq/L (ref 96–112)
Creat: 2.09 mg/dL — ABNORMAL HIGH (ref 0.50–1.10)
GLUCOSE: 108 mg/dL — AB (ref 70–99)
POTASSIUM: 4.2 meq/L (ref 3.5–5.3)
Sodium: 140 mEq/L (ref 135–145)
TOTAL PROTEIN: 7 g/dL (ref 6.0–8.3)
Total Bilirubin: 0.5 mg/dL (ref 0.2–1.2)

## 2014-10-04 LAB — TSH: TSH: 0.836 u[IU]/mL (ref 0.350–4.500)

## 2014-10-04 NOTE — Assessment & Plan Note (Signed)
She is due for TSH, LFTs.  Due for PFTs in February. We will go ahead and check a chemistry panel is well since she has had renal insufficiency. Hopefully if her renal function improves we can consider using an ACE inhibitor or ARB.

## 2014-10-04 NOTE — Telephone Encounter (Signed)
-----   Message from [name], MD sent at 10/04/2014  3:07 PM EST ----- Overall, labs look better.\ creatinine continues to improve.  [name], MD

## 2014-10-04 NOTE — Assessment & Plan Note (Signed)
Backing off on amiodarone dose. Avoid beta blockers and non-dihydropyridine calcium channel blockers

## 2014-10-04 NOTE — Assessment & Plan Note (Signed)
No active heart failure symptoms despite hypertension.

## 2014-10-04 NOTE — Assessment & Plan Note (Signed)
Monitoring TSH while on amiodarone. It does not seem to be worsening.

## 2014-10-04 NOTE — Assessment & Plan Note (Signed)
No active issues on low-dose Eliquis

## 2014-10-04 NOTE — Progress Notes (Signed)
The cath  PATIENT: [name] MRN: [id number]  DOB: 1924/06/29   DOV:10/04/2014 PCP: [name], MD  Clinic Note: Chief Complaint  Patient presents with  . Follow-up    2 months follow-up, pt c/o being off balance stated when walking she is walking toward the left or right, pt is also worried about her BP. Pt is not taking Amlodipine   HPI: [name] is a 80 y.o.  female with a PMH below who presents today for followup of atrial fibrillation with diastolic heart failure & HTN. She saw Tommy Medal twice in October for HTN -- Hydralazine increased to 75 mg tid, stopped Amlodipine due to trouble sleeping after starting it.  She basically notes that her BP has been out of control since her Sept hospitalization for Chest Pains - r/o MI - Imdur increased to 60mg , noted to be severely hypertensive with systolic blood pressures in the 220 mmHg range.No cath due to CKD IV with worsening Creatinine.  Hydralazine increased to 37.5 tid -- I think an ARB or ACE-I was previously held due to CKD.  Interval History:  She continues to be somewhat concerned about her blood pressures. She brings records showing most of the readings in the 170-190 mmHg range, but with some readings in the 130 mmHg range - with isolated Systolic HTN.  With the pressures were somewhat high, she will notice some mild dizziness and blurred vision, but for the most part really doesn't notice much in the way of symptoms. She denies any recurrent chest pain since her September hospitalization - includes chest discomfort with rest or exertion. No resting or exertional dyspnea. He denies any weakness, dizziness or syncope/near-syncope, or TIA amaurosis fugax symptoms.  She still notes having poor sleep habits, despite having ordered stop amlodipine. In general her vision is getting worse, and had to go see an eye doctor. She does says that she has poor energy levels to she also notes poor balance - but not really dizziness.   When  her blood pressures if she gets a little bit nauseated as well.  Past Medical History  Diagnosis Date  . Hyperlipidemia   . Hypothyroidism   . Arthritis   . Anemia   . Asthma   . Anxiety   . IBS (irritable bowel syndrome)   . Gout   . Colon polyps   . Tubulovillous adenoma 4/07  . Hemorrhoids   . Diverticulosis   . Chronic diarrhea   . Chronic kidney disease, stage IV (severe)   . Hypertensive heart disease     no significant RAS by MRA 2002- (<30% LRAS)   . Paroxysmal atrial fibrillation 11/01/2013    Intermittent through the years and recurrent in December of 2014 treated with amiodarone;; Negative Myoview 10/2013  . Peripheral vascular disease     123456 LICA, [id number] RICA by doppler 2009   . Benign positional vertigo   . LBBB (left bundle branch block)- new 11/01/13 11/01/2013  . Diastolic dysfunction, left ventricle 11/03/2013    Prior Cardiac Evaluation and Procedural History: Procedure Laterality Date  . Transthoracic echocardiogram  11/03/2013    Echocardiogram: EF 55-60%, mild LVH, elevated bili pressures. Mild aortic valve calcification.  Marland Kitchen Nm myoview ltd  11/02/2013    Negative for ischemia or infarction. EF 60%.    Allergies  Allergen Reactions  . Amoxicillin     REACTION: unspecified  . Codeine Phosphate     REACTION: unspecified  . Colchicine     Severe diarrhea  .  Lipitor [Atorvastatin] Other (See Comments)    "makes legs jump all night"  . Penicillins     REACTION: nausea, swelling  . Xifaxan [Rifaximin] Other (See Comments)    unknown   Current Outpatient Prescriptions  Medication Sig Dispense Refill  . amiodarone (PACERONE) 200 MG tablet Take 200 mg by mouth daily.      Marland Kitchen amLODipine (NORVASC) 5 MG tablet Take 1 tablet (5 mg total) by mouth daily.  30 tablet  0  . apixaban (ELIQUIS) 2.5 MG TABS tablet Take 1 tablet (2.5 mg total) by mouth 2 (two) times daily.  60 tablet  6  . cyanocobalamin 1000 MCG tablet Take 1 tablet (1,000 mcg total) by  mouth daily. Contact PCP for refills.  30 tablet  11  . hyoscyamine (LEVSIN, ANASPAZ) 0.125 MG tablet Take 1-2 tablets by mouth or under tongue every 4 hours as needed before meals  360 tablet  5  . isosorbide mononitrate (IMDUR) 30 MG 24 hr tablet Take 1 tablet (30 mg total) by mouth daily.  30 tablet  0  . levothyroxine (SYNTHROID, LEVOTHROID) 88 MCG tablet Take 88 mcg by mouth daily before breakfast.      . omeprazole (PRILOSEC) 40 MG capsule Take 1 capsule (40 mg total) by mouth daily.  30 capsule  11  . temazepam (RESTORIL) 30 MG capsule Take 30 mg by mouth at bedtime as needed for sleep (restless leg).       No current facility-administered medications for this visit.    History   Social History Narrative   She is a widowed mother of 2, one child is deceased. His grandmother of one. She appears to be working out routinely at [employer] using [employer]. She is otherwise quite active.   She does not drink alcohol, does not smoke.   ROS: A comprehensive Review of Systems - was performed Review of Systems  Constitutional: Negative for malaise/fatigue.  HENT: Negative for nosebleeds.   Respiratory: Negative for shortness of breath.   Cardiovascular: Negative for claudication.  Gastrointestinal: Positive for heartburn. Negative for blood in stool and melena.  Genitourinary: Negative for hematuria.  Musculoskeletal: Positive for joint pain.       Right rotator cuff discomfort.  Neurological: Positive for headaches. Negative for dizziness, sensory change, speech change, focal weakness, seizures and loss of consciousness.       Poor balance.  Psychiatric/Behavioral: Negative for depression and memory loss. The patient is nervous/anxious and has insomnia.   All other systems reviewed and are negative.  PHYSICAL EXAM BP 196/68 mmHg  Pulse 52  Ht 5\' 4"  (1.626 m)  Wt 142 lb 6.4 oz (64.592 kg)  BMI 24.43 kg/m2 General appearance: alert, cooperative, appears stated age, no distress  and Well-nourished and well-groomed. Neck: no adenopathy, soft bilateral carotid bruits, and no JVD Lungs: clear to auscultation bilaterally, normal percussion bilaterally and Nonlabored, good air movement Heart: Bradycardic with regular rhythm. Split S2 but no M/R/G.Marland Kitchen Nondisplaced PMI. Abdomen: soft, non-tender; bowel sounds normal; no masses,  no organomegaly Extremities: edema  1+ left greater than right, no ulcers, gangrene or trophic changes and varicose veins noted Pulses: 2+ and symmetric Neurologic: Grossly normal  EKG: S Bradycardia - 52 bpm, LBBB (chronic) with repolarization changes; Normal Axis, intervals & durations.   Recent Labs: 07/31/2014  BUN.Cr 51/2.4  ASSESSMENT / PLAN: Systolic hypertension, isolated; difficult to control At this point, with her renal function still being suspect, I am reluctant to add any ACE inhibitor  because medications. She did not tolerate dihydropyridine calcium channel blockers & we are unable to use non-dihydropyridine calcium channel blockers or beta blockersbecause of her low resting heart rate on amiodarone.   It would appear that her sleeping problems and not necessarily resolve with stopping amlodipine. We may be able to use it in the future.  We are simply left to increase her hydralazine to 100 mg 3 times a day.  Worsening have to tolerate permissive hypertension with blood pressure ranged from 130 to 180 mmHg as long as she is not overly symptomatic.  Hypertensive heart disease No active heart failure symptoms despite hypertension.  Paroxysmal atrial fibrillation Maintaining normal sinus rhythm on amiodarone. This has been increased to 200 mg twice a day in the hospital. We'll simply reduce it back to once a day -- which will hopefully increase her resting heart rate and allow for less fatigue. She is on reduced dose Eliquis for anticoagulation without bleeding issues.  Peripheral vascular disease No worsening bruits.  Bruits probably  related to hypertension. We'll consider repeat carotid Dopplers if the bruits increase in intensity  On amiodarone therapy She is due for TSH, LFTs.  Due for PFTs in February. We will go ahead and check a chemistry panel is well since she has had renal insufficiency. Hopefully if her renal function improves we can consider using an ACE inhibitor or ARB.  Hypothyroidism Monitoring TSH while on amiodarone. It does not seem to be worsening.  H/O tachycardia-bradycardia syndrome Backing off on amiodarone dose. Avoid beta blockers and non-dihydropyridine calcium channel blockers  Chronic anticoagulation- Eliquis No active issues on low-dose Eliquis    Orders Placed This Encounter  Procedures  . TSH  . Hepatic function panel  . Comprehensive metabolic panel  . EKG 12-Lead   Meds ordered this encounter  Medications  . hydrALAZINE (APRESOLINE) 100 MG tablet    Sig: Take 1 tablet (100 mg total) by mouth 3 (three) times daily.    Dispense:  90 tablet    Refill:  11  . amiodarone (PACERONE) 200 MG tablet    Sig: Take 1 tablet (200 mg total) by mouth daily.    Dispense:  30 tablet    Refill:  11    Followup:  3 monthsWith Luke Kilroy to 6 months with Dr. Ellyn Hack   Daija Routson W. Ellyn Hack, M.D., M.S. Interventional Cardiology [facility]

## 2014-10-04 NOTE — Assessment & Plan Note (Signed)
Maintaining normal sinus rhythm on amiodarone. This has been increased to 200 mg twice a day in the hospital. We'll simply reduce it back to once a day -- which will hopefully increase her resting heart rate and allow for less fatigue. She is on reduced dose Eliquis for anticoagulation without bleeding issues.

## 2014-10-04 NOTE — Assessment & Plan Note (Signed)
At this point, with her renal function still being suspect, I am reluctant to add any ACE inhibitor because medications. She did not tolerate dihydropyridine calcium channel blockers & we are unable to use non-dihydropyridine calcium channel blockers or beta blockersbecause of her low resting heart rate on amiodarone.   It would appear that her sleeping problems and not necessarily resolve with stopping amlodipine. We may be able to use it in the future.  We are simply left to increase her hydralazine to 100 mg 3 times a day.  Worsening have to tolerate permissive hypertension with blood pressure ranged from 130 to 180 mmHg as long as she is not overly symptomatic.

## 2014-10-04 NOTE — Assessment & Plan Note (Signed)
No worsening bruits.  Bruits probably related to hypertension. We'll consider repeat carotid Dopplers if the bruits increase in intensity

## 2014-10-04 NOTE — Telephone Encounter (Signed)
LEFT MESSAGE ON ANSWER MACHINE TO CALL BACK

## 2014-10-05 NOTE — Telephone Encounter (Signed)
Patient return call. Spoke to patient. Result given . Verbalized understanding

## 2014-10-10 ENCOUNTER — Telehealth: Payer: Self-pay | Admitting: Family Medicine

## 2014-10-10 NOTE — Telephone Encounter (Signed)
Pt calling to request higher dosage of temazepam (RESTORIL) 30 MG capsule if available, if not an alternative med. Pt states she is not sleeping at night.

## 2014-10-10 NOTE — Telephone Encounter (Signed)
This is the highest dose. Stop this and switch to Ambien 10 mg qhs prn. Call in #30 with 2 rf

## 2014-10-11 ENCOUNTER — Other Ambulatory Visit: Payer: Self-pay

## 2014-10-11 MED ORDER — LEVOTHYROXINE SODIUM 88 MCG PO TABS: 88.0000 ug | ORAL_TABLET | Freq: Every day | ORAL | Status: DC

## 2014-10-11 NOTE — Telephone Encounter (Signed)
I spoke with pt and she will continue with the Restoril, she has tried Ambien in the past, stated that it made her crazy. I added this to pt's drug allergy list.

## 2014-10-11 NOTE — Telephone Encounter (Signed)
Rx request for levothyroxine 88 mcg tablet- take 1 tablet by mouth every day before breakfast.  Rx sent to pharmacy.

## 2014-10-13 ENCOUNTER — Telehealth: Payer: Self-pay | Admitting: Cardiology

## 2014-10-13 NOTE — Telephone Encounter (Signed)
Pt have been having  problem sleeping for the last 2 weeks.She wants to know what else can she take other than what she is taking? If not there, call on her cell [phone].

## 2014-10-13 NOTE — Telephone Encounter (Signed)
Returned call to patient she stated she is having trouble sleeping at night.Advised to call PCP.

## 2014-10-16 ENCOUNTER — Telehealth: Payer: Self-pay | Admitting: Family Medicine

## 2014-10-17 ENCOUNTER — Telehealth: Payer: Self-pay | Admitting: Family Medicine

## 2014-10-17 NOTE — Telephone Encounter (Signed)
See my other reply

## 2014-10-17 NOTE — Telephone Encounter (Signed)
I spoke with pt  

## 2014-10-17 NOTE — Telephone Encounter (Signed)
[facility] Night - Client TELEPHONE [facility] Patient Name: [name] Gender: Female DOB: [birth year] Age: 80 Y 74 M 6 D Return Phone Number: [phone] (Primary), [phone] (Secondary) Address: [address]/State/Zip: [place] [address] Client [facility] Night - Client Client Site [facility] - Night Physician [name] Contact Type Call Call Type Triage / Clinical Relationship To Patient Self Return Phone Number [phone] (Primary) Chief Complaint Cough Initial Comment Caller states she has congestion, coughed all night long. Has some medicine that she was given before for gout, wants to know if that will help. PreDisposition Home Care Nurse Assessment Nurse: Butler Denmark Date/Time Eilene Ghazi Time): 10/16/2014 9:54:02 AM Confirm and document reason for call. If symptomatic, describe symptoms. ---Caller states she has congestion, coughed all night long. Has some medicine that she was given before for gout, wants to know if that will help. Has the patient traveled out of the country within the last 30 days? ---No Does the patient require triage? ---Yes Related visit to physician within the last 2 weeks? ---No Does the PT have any chronic conditions? (i.e. diabetes, asthma, etc.) ---Yes List chronic conditions. ---Irregular Heartbeat HTN GOUT Guidelines Guideline Title Affirmed Question Affirmed Notes Nurse Date/Time Eilene Ghazi Time) Cough - Acute Productive Cough (all triage questions negative) Butler Denmark 10/16/2014 9:58:43 AM Disp. Time Eilene Ghazi Time) Disposition Final User 10/16/2014 10:03:43 AM Home Care Yes Butler Denmark Caller Understands: Yes Disagree/Comply: Comply Care Advice Given Per Guideline PLEASE NOTE: All timestamps contained within this report are represented as [place] Standard Time. CONFIDENTIALTY NOTICE: This fax transmission  is intended only for the addressee. It contains information that is legally privileged, confidential or otherwise protected from use or disclosure. If you are not the intended recipient, you are strictly prohibited from reviewing, disclosing, copying using or disseminating any of this information or taking any action in reliance on or regarding this information. If you have received this fax in error, please notify us immediately by telephone so that we can arrange for its return to [place]. Phone: [phone], Toll-Free: [phone], Fax: [phone] Page: 2 of 2 Call Id: [id number] Care Advice Given Per Guideline HOME CARE: You should be able to treat this at home. REASSURANCE: * It doesn't sound like a serious cough. * Coughing up mucus is very important for protecting the lungs from pneumonia. COUGH MEDICINES: - OTC COUGH SYRUPS: The most common cough suppressant in OTC cough medications is dextromethorphan. Often the letters 'DM' appear in the name. - OTC COUGH DROPS: Cough drops can help a lot, especially for mild coughs. They reduce coughing by soothing your irritated throat and removing that tickle sensation in the back of the throat. Cough drops also have the advantage of portability - you can carry them with you. - HOME REMEDY - HARD CANDY: Hard candy works just as well as medicine-flavored OTC cough drops. Diabetics should use sugar-free candy. - HOME REMEDY - HONEY: This old home remedy has been shown to help decrease coughing at night. The adult dosage is 2 teaspoons (10 ml) at bedtime. Honey should not be given to infants under one year of age. OTC COUGH SYRUP - DEXTROMETHORPHAN: * Cough syrups containing the cough suppressant dextromethorphan (DM) may help decrease your cough. Cough syrups work best for coughs that keep you awake at night. They can also sometimes help in the late stages of a respiratory infection when the cough is dry and hacking. They can be used  along with cough drops. *  Examples: Benylin, Robitussin DM, Vicks 44 Cough Relief * Read the package instructions for dosage, contraindications, and other important information. HUMIDIFIER: If the air is dry, use a humidifier in the bedroom. (Reason: dry air makes coughs worse) AVOID TOBACCO SMOKE: Smoking or being exposed to smoke makes coughs much worse. PREVENT DEHYDRATION: * Drink adequate liquids. * This will help soothe an irritated or dry throat and loosen up the phlegm. EXPECTED COURSE: Viral bronchitis causes a cough for 1 to 3 weeks. Sometimes you may cough up lots of phlegm (mucus). The mucus can normally be white, gray, yellow or green. CALL BACK IF: * Cough lasts over 3 weeks * Continuous coughing persists over 2 hours after cough treatment * Difficulty breathing occurs * Fever over 103 F (39.4 C) * Fever lasts over 3 days * You become worse. CARE ADVICE given per Cough - Acute Productive (Adult) guideline.

## 2014-10-17 NOTE — Telephone Encounter (Signed)
Pt called to say that she has chest congestion and a cough that keeps her up at night. She is asking if Dr Sarajane Jews can call her in something because she does not feel like coming out.

## 2014-10-17 NOTE — Telephone Encounter (Signed)
I assume she is referring to prednisone, but this would not help very much. Drink fluids and try Mucinex. If she does not improve, she should make an OV

## 2014-11-07 ENCOUNTER — Encounter: Payer: Self-pay | Admitting: Internal Medicine

## 2014-11-07 ENCOUNTER — Other Ambulatory Visit (INDEPENDENT_AMBULATORY_CARE_PROVIDER_SITE_OTHER): Payer: Medicare Other

## 2014-11-07 ENCOUNTER — Ambulatory Visit (INDEPENDENT_AMBULATORY_CARE_PROVIDER_SITE_OTHER): Payer: Medicare Other | Admitting: Internal Medicine

## 2014-11-07 VITALS — BP 130/80 | HR 62 | Temp 98.1°F | Ht 62.0 in | Wt 131.0 lb

## 2014-11-07 DIAGNOSIS — K589 Irritable bowel syndrome without diarrhea: Secondary | ICD-10-CM

## 2014-11-07 DIAGNOSIS — N184 Chronic kidney disease, stage 4 (severe): Secondary | ICD-10-CM

## 2014-11-07 DIAGNOSIS — R7401 Elevation of levels of liver transaminase levels: Secondary | ICD-10-CM

## 2014-11-07 DIAGNOSIS — R195 Other fecal abnormalities: Secondary | ICD-10-CM

## 2014-11-07 DIAGNOSIS — R740 Nonspecific elevation of levels of transaminase and lactic acid dehydrogenase [LDH]: Secondary | ICD-10-CM

## 2014-11-07 DIAGNOSIS — R634 Abnormal weight loss: Secondary | ICD-10-CM

## 2014-11-07 DIAGNOSIS — R110 Nausea: Secondary | ICD-10-CM

## 2014-11-07 LAB — CBC WITH DIFFERENTIAL/PLATELET
Basophils Absolute: 0 10*3/uL (ref 0.0–0.1)
Basophils Relative: 0.2 % (ref 0.0–3.0)
EOS PCT: 0.4 % (ref 0.0–5.0)
Eosinophils Absolute: 0 10*3/uL (ref 0.0–0.7)
HCT: 34.8 % — ABNORMAL LOW (ref 36.0–46.0)
Hemoglobin: 11.3 g/dL — ABNORMAL LOW (ref 12.0–15.0)
Lymphocytes Relative: 14.4 % (ref 12.0–46.0)
Lymphs Abs: 0.7 10*3/uL (ref 0.7–4.0)
MCHC: 32.5 g/dL (ref 30.0–36.0)
MCV: 94.3 fl (ref 78.0–100.0)
MONOS PCT: 9.5 % (ref 3.0–12.0)
Monocytes Absolute: 0.4 10*3/uL (ref 0.1–1.0)
NEUTROS ABS: 3.5 10*3/uL (ref 1.4–7.7)
Neutrophils Relative %: 75.5 % (ref 43.0–77.0)
PLATELETS: 181 10*3/uL (ref 150.0–400.0)
RBC: 3.69 Mil/uL — ABNORMAL LOW (ref 3.87–5.11)
RDW: 15.4 % (ref 11.5–15.5)
WBC: 4.6 10*3/uL (ref 4.0–10.5)

## 2014-11-07 LAB — BASIC METABOLIC PANEL
BUN: 43 mg/dL — AB (ref 6–23)
CO2: 26 meq/L (ref 19–32)
Calcium: 9 mg/dL (ref 8.4–10.5)
Chloride: 104 mEq/L (ref 96–112)
Creatinine, Ser: 2.4 mg/dL — ABNORMAL HIGH (ref 0.4–1.2)
GFR: 20.34 mL/min — ABNORMAL LOW (ref >60.00)
Glucose, Bld: 106 mg/dL — ABNORMAL HIGH (ref 70–99)
Potassium: 4.8 mEq/L (ref 3.5–5.1)
SODIUM: 139 meq/L (ref 135–145)

## 2014-11-07 LAB — HEPATIC FUNCTION PANEL
ALBUMIN: 3.9 g/dL (ref 3.5–5.2)
ALT: 165 U/L — AB (ref 0–35)
AST: 117 U/L — ABNORMAL HIGH (ref 0–37)
Alkaline Phosphatase: 72 U/L (ref 39–117)
BILIRUBIN TOTAL: 0.4 mg/dL (ref 0.2–1.2)
Bilirubin, Direct: 0.1 mg/dL (ref 0.0–0.3)
Total Protein: 7 g/dL (ref 6.0–8.3)

## 2014-11-07 MED ORDER — RANITIDINE HCL 150 MG PO TABS: 150.0000 mg | ORAL_TABLET | Freq: Two times a day (BID) | ORAL | Status: DC

## 2014-11-07 NOTE — Progress Notes (Signed)
Pre visit review using our clinic review tool, if applicable. No additional management support is needed unless otherwise documented below in the visit note. 

## 2014-11-07 NOTE — Patient Instructions (Signed)
Your next office appointment will be determined based upon review of your pending labs. Those instructions will be transmitted to you through My Chart  OR  by mail;whichever process is your choice to receive results & recommendations .   Reflux of gastric acid may be asymptomatic as this may occur mainly during sleep.The triggers for reflux  include stress; the "aspirin family" ; alcohol; peppermint; and caffeine (coffee, tea, cola, and chocolate). The aspirin family would include aspirin and the nonsteroidal agents such as ibuprofen &  Naproxen. Tylenol would not cause reflux. If having symptoms ; food & drink should be avoided for @ least 2 hours before going to bed.   Please take a probiotic , Florastor OR Align, every day if the bowels are loose. This will replace the normal bacteria which  are necessary for formation of normal stool and processing of food.

## 2014-11-07 NOTE — Assessment & Plan Note (Signed)
BMET 

## 2014-11-07 NOTE — Progress Notes (Signed)
   Subjective:    Patient ID: [name], female    DOB: [birth year], 80 y.o.   MRN: [id number]  HPI   She has had nausea without vomiting present 2-3 weeks. It is described as a nagging sensation.  Additionally 12/21 and 12/22 she had loose stool. This recurred 12/24 and she took Imodium. She's had no bowel movement since that time  She's lost 15 pounds in the last 30 days from a high of 143 on 12/6 down to 125.4 on 12/23. This is related to weight monitor prescribed by her Cardiologist because of her history of chronic diastolic heart failure.  She has associated loss of appetite but denies other GI or genitourinary symptoms.  ALT 42 ; creat 2.09 & TSH 0.836  on 10/03/14    Review of Systems Unexplained weight loss, abdominal pain, significant dyspepsia, dysphagia, melena, rectal bleeding, or persistently small caliber stools are denied. She denies dysuria, pyuria, hematuria.        Objective:   Physical Exam Pertinent or positive findings include: She is thin but appears adequately nourished She has postoperative changes of the right pupil with distortion and irregularity. She has a grade 1 systolic murmur with accentuation of the second heart sound Bilateral carotid bruits or radiation of the murmur present Breath sounds are decreased without increased work of breathing She has mixed DIP/PIP joint changes She has one half-1+ pitting edema of the lower ankle. Pedal pulses are surprisingly good Skin reveals diffuse keratoses. She has scattered bruising.   Eyes: No conjunctival inflammation or scleral icterus is present. Oral exam: Dental hygiene is good. Lips and gums are healthy appearing.There is no oropharyngeal erythema or exudate noted.  Heart:  Normal rate and regular rhythm without gallop, click, rub or other extra sounds   Lungs:Chest clear to auscultation; no wheezes, rhonchi,rales ,or rubs present. Abdomen: bowel sounds normal, soft and non-tender without  masses, organomegaly or hernias noted.  No guarding or rebound. No flank tenderness to percussion. Vascular : all pulses equal  Skin:Warm & dry.  Intact without suspicious lesions or rashes ; no jaundice or tenting Lymphatic: No lymphadenopathy is noted about the head, neck, axilla         Assessment & Plan:  #1 persistent nausea without associated abdominal pain or other GI symptoms except loss of appetite  #2 dramatic change in weight in the context of chronic diastolic heart failure with strict weight parameters  #3 chronic renal impairment  #4 elevated LFT   #5 loose stool with PMH IBS  Plan: See orders recommendations

## 2014-11-10 ENCOUNTER — Other Ambulatory Visit: Payer: Self-pay | Admitting: Internal Medicine

## 2014-11-10 DIAGNOSIS — Z79899 Other long term (current) drug therapy: Secondary | ICD-10-CM

## 2014-11-10 DIAGNOSIS — R7989 Other specified abnormal findings of blood chemistry: Secondary | ICD-10-CM

## 2014-11-10 DIAGNOSIS — N184 Chronic kidney disease, stage 4 (severe): Secondary | ICD-10-CM

## 2014-11-10 DIAGNOSIS — R945 Abnormal results of liver function studies: Secondary | ICD-10-CM

## 2014-11-14 ENCOUNTER — Telehealth: Payer: Self-pay | Admitting: Cardiology

## 2014-11-14 NOTE — Telephone Encounter (Signed)
Returned call to patient. Her PCP had ordered labs and noted her BUN/creatinine to be a little more elevated than 1 month ago, however her ALT and AST was quite elevated. She states her PCP office had told her they would talk with Dr. Ellyn Hack about her medications and if she should hold any of them. She called [place] for advice. I told her what the result notes indicated between Dr. Linna Darner and Dr. Ellyn Hack but advised her to contact PCP for final plan regarding her medications, plan for repeat labs, etc. She voiced understanding and will reach out to PCP office.

## 2014-11-14 NOTE — Telephone Encounter (Signed)
Pt had blood work at her other doctor on 11-07-14. He says her kidney functions were worse,please call. Pt says she is very confused,he wanted her to stop taking one of her medicine.

## 2014-11-15 ENCOUNTER — Encounter: Payer: Self-pay | Admitting: Family Medicine

## 2014-11-15 ENCOUNTER — Ambulatory Visit (INDEPENDENT_AMBULATORY_CARE_PROVIDER_SITE_OTHER): Payer: Medicare Other | Admitting: Family Medicine

## 2014-11-15 ENCOUNTER — Ambulatory Visit: Payer: Medicare Other | Admitting: Internal Medicine

## 2014-11-15 VITALS — BP 164/60 | HR 87 | Temp 97.9°F | Ht 62.0 in | Wt 136.0 lb

## 2014-11-15 DIAGNOSIS — R748 Abnormal levels of other serum enzymes: Secondary | ICD-10-CM

## 2014-11-15 DIAGNOSIS — J0100 Acute maxillary sinusitis, unspecified: Secondary | ICD-10-CM

## 2014-11-15 DIAGNOSIS — I480 Paroxysmal atrial fibrillation: Secondary | ICD-10-CM

## 2014-11-15 LAB — GAMMA GT: GGT: 32 U/L (ref 7–51)

## 2014-11-15 LAB — HEPATIC FUNCTION PANEL
ALT: 92 U/L — ABNORMAL HIGH (ref 0–35)
AST: 72 U/L — ABNORMAL HIGH (ref 0–37)
Albumin: 3.9 g/dL (ref 3.5–5.2)
Alkaline Phosphatase: 69 U/L (ref 39–117)
Bilirubin, Direct: 0 mg/dL (ref 0.0–0.3)
TOTAL PROTEIN: 6.9 g/dL (ref 6.0–8.3)
Total Bilirubin: 0.6 mg/dL (ref 0.2–1.2)

## 2014-11-15 MED ORDER — TEMAZEPAM 30 MG PO CAPS: 30.0000 mg | ORAL_CAPSULE | Freq: Every evening | ORAL | Status: DC | PRN

## 2014-11-15 MED ORDER — CEFUROXIME AXETIL 500 MG PO TABS: 500.0000 mg | ORAL_TABLET | Freq: Two times a day (BID) | ORAL | Status: DC

## 2014-11-15 MED ORDER — LORAZEPAM 0.5 MG PO TABS: 0.5000 mg | ORAL_TABLET | Freq: Three times a day (TID) | ORAL | Status: DC | PRN

## 2014-11-15 NOTE — Progress Notes (Signed)
   Subjective:    Patient ID: [name], female    DOB: [birth year], 81 y.o.   MRN: [id number]  HPI Here with her daughter to follow up on several issues. First she has been feeling poorly for the past month with sinus pressure, PND, a dry cough, nausea, and a poor appetite. No fever or vomiting. No diarrhea. She saw Urgent Care about 3 weeks ago and was given prednisone and Doxycycline, but this did not help much. Then she saw Dr. Linna Darner on 11-07-14 and he ordered a panel of labs which were remarkable only for some mild elevations of her AST and ALT. He discussed this with Dr. Ellyn Hack, her cardiologist, and they agreed that this could be a possible side effect of her amiodarone. Dr. Ellyn Hack said he could switch this to another agent to treat her chronic atril fib, but he would like her to stay on this if possible. They asked [place] to see if there could be another explanation. She has not used any alcohol for the past several weeks and she has totally avoided taking acetaminophen. She has no abdominal pain and has noticed no jaundice.    Review of Systems  Constitutional: Positive for appetite change. Negative for fever, chills and diaphoresis.  HENT: Positive for congestion, postnasal drip and sinus pressure.   Eyes: Negative.   Respiratory: Positive for cough. Negative for chest tightness, shortness of breath and wheezing.   Cardiovascular: Negative.   Gastrointestinal: Positive for nausea. Negative for vomiting, abdominal pain, diarrhea, constipation, blood in stool, abdominal distention, anal bleeding and rectal pain.  Genitourinary: Negative.   Neurological: Negative.        Objective:   Physical Exam  Constitutional: She appears well-developed and well-nourished. No distress.  Eyes: Conjunctivae are normal. No scleral icterus.  Neck: Neck supple. No thyromegaly present.  Cardiovascular: Normal rate, normal heart sounds and intact distal pulses.   Irregular rhythm  Pulmonary/Chest:  Effort normal and breath sounds normal. No respiratory distress. She has no wheezes. She has no rales.  Abdominal: Soft. Bowel sounds are normal. She exhibits no distension and no mass. There is no tenderness. There is no rebound and no guarding.  No HSM   Lymphadenopathy:    She has no cervical adenopathy.          Assessment & Plan:  I think it is likely that the amiodarone has caused the liver enzyme elevations but we will set up an abdominal US this week to rule out any bile duct obstructions. Repeat some enzyme levels today. Treat the sinusitis with Ceftin.

## 2014-11-15 NOTE — Progress Notes (Signed)
Pre visit review using our clinic review tool, if applicable. No additional management support is needed unless otherwise documented below in the visit note. 

## 2014-11-16 ENCOUNTER — Telehealth: Payer: Self-pay | Admitting: Family Medicine

## 2014-11-16 DIAGNOSIS — E041 Nontoxic single thyroid nodule: Secondary | ICD-10-CM

## 2014-11-16 MED ORDER — DOXYCYCLINE HYCLATE 100 MG PO TABS: 100.0000 mg | ORAL_TABLET | Freq: Two times a day (BID) | ORAL | Status: DC

## 2014-11-16 NOTE — Telephone Encounter (Signed)
I spoke with pt, sent new script e-scribe and also the Ceftin was added to drug allergy list.

## 2014-11-16 NOTE — Telephone Encounter (Signed)
Patient states she is having severe diarrhea after taking cefUROXime (CEFTIN) 500 MG tablet.  She said that after reading the material provided with the medication she read, "do not take if you have an allergy to penicillin."  Patient stated, " I have an allergy to penicillin and will stop taking the medication".  She would like a callback and a new rx called to [id number] - , [place].

## 2014-11-16 NOTE — Telephone Encounter (Signed)
I agree. Stop the Ceftin. Start taking a probiotic like Align daily. Call in Doxycyline 100 mg bid for 10 days to treat the sinus infection

## 2014-11-17 ENCOUNTER — Telehealth: Payer: Self-pay | Admitting: *Deleted

## 2014-11-17 NOTE — Telephone Encounter (Signed)
-----   Message from Raiford Simmonds, RN sent at 01/20/2014  3:12 PM EDT ----- MAKE sure PFT is schedule  For 12/2014 1 year Recheck

## 2014-11-17 NOTE — Telephone Encounter (Signed)
Mailed letter reminder for PFT 12/26/14 Appointment schedule with Dr Eudelia Bunch 01/2015

## 2014-11-19 ENCOUNTER — Encounter (HOSPITAL_COMMUNITY): Payer: Self-pay | Admitting: Emergency Medicine

## 2014-11-19 ENCOUNTER — Emergency Department (HOSPITAL_COMMUNITY): Payer: Medicare Other

## 2014-11-19 ENCOUNTER — Emergency Department (HOSPITAL_COMMUNITY)
Admission: EM | Admit: 2014-11-19 | Discharge: 2014-11-20 | Disposition: A | Discharge status: Home / Self Care | Payer: Medicare Other | Attending: Emergency Medicine | Admitting: Emergency Medicine

## 2014-11-19 DIAGNOSIS — S51801A Unspecified open wound of right forearm, initial encounter: Secondary | ICD-10-CM | POA: Diagnosis not present

## 2014-11-19 DIAGNOSIS — K007 Teething syndrome: Secondary | ICD-10-CM | POA: Diagnosis not present

## 2014-11-19 DIAGNOSIS — Z8719 Personal history of other diseases of the digestive system: Secondary | ICD-10-CM | POA: Diagnosis not present

## 2014-11-19 DIAGNOSIS — E039 Hypothyroidism, unspecified: Secondary | ICD-10-CM | POA: Insufficient documentation

## 2014-11-19 DIAGNOSIS — W01198A Fall on same level from slipping, tripping and stumbling with subsequent striking against other object, initial encounter: Secondary | ICD-10-CM | POA: Diagnosis not present

## 2014-11-19 DIAGNOSIS — Z86010 Personal history of colonic polyps: Secondary | ICD-10-CM | POA: Insufficient documentation

## 2014-11-19 DIAGNOSIS — S61204A Unspecified open wound of right ring finger without damage to nail, initial encounter: Secondary | ICD-10-CM | POA: Diagnosis not present

## 2014-11-19 DIAGNOSIS — Y998 Other external cause status: Secondary | ICD-10-CM | POA: Diagnosis not present

## 2014-11-19 DIAGNOSIS — Z79899 Other long term (current) drug therapy: Secondary | ICD-10-CM | POA: Diagnosis not present

## 2014-11-19 DIAGNOSIS — S0242XA Fracture of alveolus of maxilla, initial encounter for closed fracture: Secondary | ICD-10-CM | POA: Diagnosis not present

## 2014-11-19 DIAGNOSIS — N184 Chronic kidney disease, stage 4 (severe): Secondary | ICD-10-CM | POA: Insufficient documentation

## 2014-11-19 DIAGNOSIS — D649 Anemia, unspecified: Secondary | ICD-10-CM | POA: Insufficient documentation

## 2014-11-19 DIAGNOSIS — I1310 Hypertensive heart and chronic kidney disease without heart failure, with stage 1 through stage 4 chronic kidney disease, or unspecified chronic kidney disease: Secondary | ICD-10-CM | POA: Insufficient documentation

## 2014-11-19 DIAGNOSIS — Z8739 Personal history of other diseases of the musculoskeletal system and connective tissue: Secondary | ICD-10-CM | POA: Insufficient documentation

## 2014-11-19 DIAGNOSIS — Z880 Allergy status to penicillin: Secondary | ICD-10-CM | POA: Insufficient documentation

## 2014-11-19 DIAGNOSIS — Z792 Long term (current) use of antibiotics: Secondary | ICD-10-CM | POA: Diagnosis not present

## 2014-11-19 DIAGNOSIS — S199XXA Unspecified injury of neck, initial encounter: Secondary | ICD-10-CM | POA: Diagnosis not present

## 2014-11-19 DIAGNOSIS — Z8669 Personal history of other diseases of the nervous system and sense organs: Secondary | ICD-10-CM | POA: Insufficient documentation

## 2014-11-19 DIAGNOSIS — Y9389 Activity, other specified: Secondary | ICD-10-CM | POA: Diagnosis not present

## 2014-11-19 DIAGNOSIS — S61202A Unspecified open wound of right middle finger without damage to nail, initial encounter: Secondary | ICD-10-CM | POA: Diagnosis not present

## 2014-11-19 DIAGNOSIS — I480 Paroxysmal atrial fibrillation: Secondary | ICD-10-CM | POA: Insufficient documentation

## 2014-11-19 DIAGNOSIS — S0993XA Unspecified injury of face, initial encounter: Secondary | ICD-10-CM | POA: Diagnosis present

## 2014-11-19 DIAGNOSIS — I739 Peripheral vascular disease, unspecified: Secondary | ICD-10-CM | POA: Insufficient documentation

## 2014-11-19 DIAGNOSIS — S02401A Maxillary fracture, unspecified, initial encounter for closed fracture: Secondary | ICD-10-CM

## 2014-11-19 DIAGNOSIS — J45909 Unspecified asthma, uncomplicated: Secondary | ICD-10-CM | POA: Diagnosis not present

## 2014-11-19 DIAGNOSIS — S0120XA Unspecified open wound of nose, initial encounter: Secondary | ICD-10-CM | POA: Diagnosis not present

## 2014-11-19 DIAGNOSIS — Y92511 Restaurant or cafe as the place of occurrence of the external cause: Secondary | ICD-10-CM | POA: Insufficient documentation

## 2014-11-19 DIAGNOSIS — W19XXXA Unspecified fall, initial encounter: Secondary | ICD-10-CM

## 2014-11-19 DIAGNOSIS — W231XXA Caught, crushed, jammed, or pinched between stationary objects, initial encounter: Secondary | ICD-10-CM | POA: Diagnosis not present

## 2014-11-19 DIAGNOSIS — S61402A Unspecified open wound of left hand, initial encounter: Secondary | ICD-10-CM | POA: Insufficient documentation

## 2014-11-19 DIAGNOSIS — F419 Anxiety disorder, unspecified: Secondary | ICD-10-CM | POA: Insufficient documentation

## 2014-11-19 DIAGNOSIS — S01511A Laceration without foreign body of lip, initial encounter: Secondary | ICD-10-CM | POA: Diagnosis not present

## 2014-11-19 MED ORDER — MORPHINE SULFATE 4 MG/ML IJ SOLN
2.0000 mg | Freq: Once | INTRAMUSCULAR | Status: AC
  Administered 2014-11-19: 2 mg via INTRAVENOUS
  Filled 2014-11-19: qty 1

## 2014-11-19 MED ORDER — AZITHROMYCIN 250 MG PO TABS: 250.0000 mg | ORAL_TABLET | Freq: Every day | ORAL | Status: DC

## 2014-11-19 MED ORDER — ONDANSETRON HCL 4 MG/2ML IJ SOLN
4.0000 mg | Freq: Once | INTRAMUSCULAR | Status: AC
  Administered 2014-11-19: 4 mg via INTRAVENOUS
  Filled 2014-11-19: qty 2

## 2014-11-19 MED ORDER — ONDANSETRON HCL 4 MG PO TABS: 4.0000 mg | ORAL_TABLET | Freq: Four times a day (QID) | ORAL | Status: DC

## 2014-11-19 MED ORDER — OXYCODONE HCL 5 MG PO TABS: 5.0000 mg | ORAL_TABLET | ORAL | Status: DC | PRN

## 2014-11-19 NOTE — ED Provider Notes (Signed)
CSN: [id number]     Arrival date & time 11/19/14  1917 History   First MD Initiated Contact with Patient 11/19/14 2002     Chief Complaint  Patient presents with  . Fall  . Facial Injury   (Consider location/radiation/quality/duration/timing/severity/associated sxs/prior Treatment) HPI [name] is a 81 yo female presenting with reports of fall appr 45 min PTA.  She was at [employer] and her feet became tangled in the chair legs that were very close together.  She states she lost her balance and fell forward, hitting her face on the floor.  She notes pain to her nose and her mouth and she broke her frontal partial.  She denies any of her other teeth feeling loose.  She reports pain to her arms where she has various skin tears.  She is able to move both arms well and denies any decreased range of motion or bony tenderness.  She denies loss of consciousness, nausea, vomiting, blurred vision or uncontrolled bleeding.     Past Medical History  Diagnosis Date  . Hyperlipidemia   . Hypothyroidism   . Arthritis   . Anemia   . Asthma   . Anxiety   . IBS (irritable bowel syndrome)   . Gout   . Colon polyps   . Tubulovillous adenoma 4/07  . Hemorrhoids   . Diverticulosis   . Chronic diarrhea   . Chronic kidney disease, stage IV (severe)   . Hypertensive heart disease     no significant RAS by MRA 2002- (<30% LRAS)   . Paroxysmal atrial fibrillation 11/01/2013    Intermittent through the years and recurrent in December of 2014 treated with amiodarone;; Negative Myoview 10/2013  . Peripheral vascular disease     123456 LICA, [id number] RICA by doppler 2009   . Benign positional vertigo   . LBBB (left bundle branch block)- new 11/01/13 11/01/2013  . Diastolic dysfunction, left ventricle 11/03/2013   Past Surgical History  Procedure Laterality Date  . Lumbar disc surgery  2000  . Squamous cell skin cancer removed    . Oophorectomy  1962    right  . Appendectomy    . Low anterior  bowel resection  7/08  . Cataract extraction    . Transthoracic echocardiogram  11/03/2013    Echocardiogram: EF 55-60%, mild LVH, elevated bili pressures. Mild aortic valve calcification.  Marland Kitchen Nm myoview ltd  11/02/2013    Negative for ischemia or infarction. EF 60%.   Family History  Problem Relation Age of Onset  . Cancer Mother 76  . Heart attack Father   . Heart disease Brother   . Stroke Maternal Grandmother   . Cancer Maternal Grandfather     Lung cancer  . Stroke Paternal Grandmother   . Cancer Daughter    History  Substance Use Topics  . Smoking status: Never Smoker   . Smokeless tobacco: Never Used     Comment: never used tobacco  . Alcohol Use: No   OB History    No data available     Review of Systems  Constitutional: Negative for fever and chills.  HENT: Negative for sore throat.   Eyes: Negative for visual disturbance.  Respiratory: Negative for cough and shortness of breath.   Cardiovascular: Negative for chest pain and leg swelling.  Gastrointestinal: Negative for nausea, vomiting and diarrhea.  Genitourinary: Negative for dysuria.  Musculoskeletal: Positive for myalgias and neck pain. Negative for neck stiffness.  Skin: Positive for wound. Negative for  rash.  Neurological: Negative for weakness, numbness and headaches.    Allergies  Ambien; Amoxicillin; Ceftin; Codeine phosphate; Colchicine; Lipitor; Penicillins; and Xifaxan  Home Medications   Prior to Admission medications   Medication Sig Start Date End Date Taking? Authorizing Provider  amiodarone (PACERONE) 200 MG tablet Take 1 tablet (200 mg total) by mouth daily. 10/03/14   [name], MD  Cyanocobalamin (VITAMIN B 12 PO) Take 1,000 mcg by mouth every morning.    Historical Provider, MD  doxycycline (VIBRA-TABS) 100 MG tablet Take 1 tablet (100 mg total) by mouth 2 (two) times daily. 11/16/14   [name], MD  ELIQUIS 2.5 MG TABS tablet TAKE 1 TABLET (2.5 MG TOTAL) BY MOUTH 2 TIMES  DAILY. 08/02/14   Erlene Quan, PA-C  furosemide (LASIX) 40 MG tablet Take 40 mg by mouth daily as needed for fluid.    Historical Provider, MD  hydrALAZINE (APRESOLINE) 100 MG tablet Take 1 tablet (100 mg total) by mouth 3 (three) times daily. 10/03/14   [name], MD  hyoscyamine (LEVSIN, ANASPAZ) 0.125 MG tablet Take 1-2 tablets by mouth or under tongue every 4 hours as needed before meals 10/29/13   Ladene Artist, MD  isosorbide mononitrate (IMDUR) 60 MG 24 hr tablet Take 1 tablet (60 mg total) by mouth daily. 07/31/14   Rogelia Mire, NP  levothyroxine (SYNTHROID, LEVOTHROID) 88 MCG tablet Take 1 tablet (88 mcg total) by mouth daily before breakfast. 10/11/14   [name], MD  LORazepam (ATIVAN) 0.5 MG tablet Take 1 tablet (0.5 mg total) by mouth every 8 (eight) hours as needed for anxiety. 11/15/14   [name], MD  nitroGLYCERIN (NITROSTAT) 0.4 MG SL tablet Place 1 tablet (0.4 mg total) under the tongue every 5 (five) minutes x 3 doses as needed for chest pain. 07/31/14   Rogelia Mire, NP  ranitidine (ZANTAC) 150 MG tablet Take 1 tablet (150 mg total) by mouth 2 (two) times daily. 11/07/14   Hendricks Limes, MD  temazepam (RESTORIL) 30 MG capsule Take 1 capsule (30 mg total) by mouth at bedtime as needed for sleep. 11/15/14   [name], MD   BP 170/60 mmHg  Pulse 57  Resp 13  SpO2 98% Physical Exam  Constitutional: She appears well-developed and well-nourished. No distress.  HENT:  Head: Normocephalic.    Mouth/Throat: Oropharynx is clear and moist. Abnormal dentition. No oropharyngeal exudate.    Eyes: Conjunctivae are normal.    Neck: Neck supple. No thyromegaly present.  Cardiovascular: Normal rate, regular rhythm and intact distal pulses.   Pulmonary/Chest: Effort normal and breath sounds normal. No respiratory distress. She has no wheezes. She has no rales. She exhibits no tenderness.  Abdominal: Soft. There is no tenderness.  Musculoskeletal: She  exhibits no tenderness.       Arms: Lymphadenopathy:    She has no cervical adenopathy.  Neurological: She is alert.  Skin: Skin is warm and dry. No rash noted. She is not diaphoretic.  Psychiatric: She has a normal mood and affect.  Nursing note and vitals reviewed.   ED Course  Procedures (including critical care time) Labs Review Labs Reviewed - No data to display  Imaging Review Ct Head Wo Contrast  11/19/2014   CLINICAL DATA:  Patient status post fall.  No loss of consciousness.  EXAM: CT HEAD WITHOUT CONTRAST  CT MAXILLOFACIAL WITHOUT CONTRAST  CT CERVICAL SPINE WITHOUT CONTRAST  TECHNIQUE: Multidetector CT imaging of  the head, cervical spine, and maxillofacial structures were performed using the standard protocol without intravenous contrast. Multiplanar CT image reconstructions of the cervical spine and maxillofacial structures were also generated.  COMPARISON:  None.  FINDINGS: CT HEAD FINDINGS  Ventricles and sulci are appropriate for patient's age. No evidence for acute cortically based infarct, intracranial hemorrhage, mass lesion or mass effect. Mucosal thickening within the maxillary sinuses. Mastoid air cells are unremarkable. Calvarium is intact.  CT MAXILLOFACIAL FINDINGS  There is avulsion of the eighth tooth through the maxilla with surrounding fracture through the alveolar ridge. Fracture lines extend through the maxilla into the anterior and lateral aspect of the left maxillary sinus. There is a nondisplaced osseous nasal septal fracture. There is soft tissue swelling overlying the nasal bone.  CT CERVICAL SPINE FINDINGS  There is a 2.3 x 2.0 cm mass within the left aspect of the thyroid (image 73; series 11).  Relative straightening of the normal cervical lordosis. No evidence for acute cervical spine fracture. Multilevel degenerative disc disease. Craniocervical junction is unremarkable. Prevertebral soft tissues are unremarkable.  IMPRESSION: 1. There is avulsion of the 8  tooth from the maxilla with surrounding alveolar ridge fracture. Fracture lines extend through the maxilla into the anterior lateral aspect of the left maxillary sinus. 2. Nondisplaced osseous nasal septal fracture. 3. No acute intracranial process. 4. No acute cervical spine fracture. 5. 2.3 cm soft tissue nodule within the left lobe of the thyroid. Recommend correlation with thyroid ultrasound.   Electronically Signed   By: Lovey Newcomer M.D.   On: 11/19/2014 22:03   Ct Cervical Spine Wo Contrast  11/19/2014   CLINICAL DATA:  Patient status post fall.  No loss of consciousness.  EXAM: CT HEAD WITHOUT CONTRAST  CT MAXILLOFACIAL WITHOUT CONTRAST  CT CERVICAL SPINE WITHOUT CONTRAST  TECHNIQUE: Multidetector CT imaging of the head, cervical spine, and maxillofacial structures were performed using the standard protocol without intravenous contrast. Multiplanar CT image reconstructions of the cervical spine and maxillofacial structures were also generated.  COMPARISON:  None.  FINDINGS: CT HEAD FINDINGS  Ventricles and sulci are appropriate for patient's age. No evidence for acute cortically based infarct, intracranial hemorrhage, mass lesion or mass effect. Mucosal thickening within the maxillary sinuses. Mastoid air cells are unremarkable. Calvarium is intact.  CT MAXILLOFACIAL FINDINGS  There is avulsion of the eighth tooth through the maxilla with surrounding fracture through the alveolar ridge. Fracture lines extend through the maxilla into the anterior and lateral aspect of the left maxillary sinus. There is a nondisplaced osseous nasal septal fracture. There is soft tissue swelling overlying the nasal bone.  CT CERVICAL SPINE FINDINGS  There is a 2.3 x 2.0 cm mass within the left aspect of the thyroid (image 73; series 11).  Relative straightening of the normal cervical lordosis. No evidence for acute cervical spine fracture. Multilevel degenerative disc disease. Craniocervical junction is unremarkable.  Prevertebral soft tissues are unremarkable.  IMPRESSION: 1. There is avulsion of the 8 tooth from the maxilla with surrounding alveolar ridge fracture. Fracture lines extend through the maxilla into the anterior lateral aspect of the left maxillary sinus. 2. Nondisplaced osseous nasal septal fracture. 3. No acute intracranial process. 4. No acute cervical spine fracture. 5. 2.3 cm soft tissue nodule within the left lobe of the thyroid. Recommend correlation with thyroid ultrasound.   Electronically Signed   By: Lovey Newcomer M.D.   On: 11/19/2014 22:03   Ct Maxillofacial Wo Cm  11/19/2014   CLINICAL DATA:  Patient status post fall.  No loss of consciousness.  EXAM: CT HEAD WITHOUT CONTRAST  CT MAXILLOFACIAL WITHOUT CONTRAST  CT CERVICAL SPINE WITHOUT CONTRAST  TECHNIQUE: Multidetector CT imaging of the head, cervical spine, and maxillofacial structures were performed using the standard protocol without intravenous contrast. Multiplanar CT image reconstructions of the cervical spine and maxillofacial structures were also generated.  COMPARISON:  None.  FINDINGS: CT HEAD FINDINGS  Ventricles and sulci are appropriate for patient's age. No evidence for acute cortically based infarct, intracranial hemorrhage, mass lesion or mass effect. Mucosal thickening within the maxillary sinuses. Mastoid air cells are unremarkable. Calvarium is intact.  CT MAXILLOFACIAL FINDINGS  There is avulsion of the eighth tooth through the maxilla with surrounding fracture through the alveolar ridge. Fracture lines extend through the maxilla into the anterior and lateral aspect of the left maxillary sinus. There is a nondisplaced osseous nasal septal fracture. There is soft tissue swelling overlying the nasal bone.  CT CERVICAL SPINE FINDINGS  There is a 2.3 x 2.0 cm mass within the left aspect of the thyroid (image 73; series 11).  Relative straightening of the normal cervical lordosis. No evidence for acute cervical spine fracture.  Multilevel degenerative disc disease. Craniocervical junction is unremarkable. Prevertebral soft tissues are unremarkable.  IMPRESSION: 1. There is avulsion of the 8 tooth from the maxilla with surrounding alveolar ridge fracture. Fracture lines extend through the maxilla into the anterior lateral aspect of the left maxillary sinus. 2. Nondisplaced osseous nasal septal fracture. 3. No acute intracranial process. 4. No acute cervical spine fracture. 5. 2.3 cm soft tissue nodule within the left lobe of the thyroid. Recommend correlation with thyroid ultrasound.   Electronically Signed   By: Lovey Newcomer M.D.   On: 11/19/2014 22:03     EKG Interpretation None      MDM   Final diagnoses:  Fall  Closed fracture of alveolar ridge of maxilla, initial encounter   81 yo with multiple skin tears and alveolar ridge fracture and nasal fracture diagnosed on CT after mechanical fall.  Her feet were tangled in the legs of closely placed chairs at a restaurant. Case discussed with Dr. Wilson Singer.  She denies any LOC and her bleeding is controlled despite being on eliquis for afib.  Her CT head and c-spine were negative. Wound irrigation done of all wounds including oral wound irrigation.  None of the skin tears appeared amenable to suture repair but they were cleaned and dressed by me with antibiotic ointment, nonstick dressing and coban.  Discussed with pt to follow up with oral surgery for mgmt of fracture. Pt is well-appearing, in no acute distress and vital signs are stable.  They appear safe to be discharged.  Return precautions provided. Pt and family aware of plan and in agreement.    Filed Vitals:   11/19/14 2200 11/19/14 2202 11/19/14 2230 11/20/14 0019  BP: 176/50 164/50 163/46 173/59  Pulse: 55 54 54 56  Temp:      TempSrc:  Other (Comment)    Resp: 16 12 14 15   SpO2: 96% 97% 97% 95%   Meds given in ED:  Medications  morphine 4 MG/ML injection 2 mg (2 mg Intravenous Given 11/19/14 2042)  ondansetron  (ZOFRAN) injection 4 mg (4 mg Intravenous Given 11/19/14 2042)    Discharge Medication List as of 11/20/2014 12:09 AM    START taking these medications   Details  azithromycin (ZITHROMAX) 250 MG tablet Take 1 tablet (250 mg total) by mouth  daily. Take first 2 tablets together, then 1 every day until finished., Starting 11/19/2014, Until Discontinued, Print    ondansetron (ZOFRAN) 4 MG tablet Take 1 tablet (4 mg total) by mouth every 6 (six) hours., Starting 11/19/2014, Until Discontinued, Print    oxyCODONE (ROXICODONE) 5 MG immediate release tablet Take 1 tablet (5 mg total) by mouth every 4 (four) hours as needed for severe pain., Starting 11/19/2014, Until Discontinued, Print           Britt Bottom, NP 11/20/14 [place]  Virgel Manifold, MD 11/21/14 1125

## 2014-11-19 NOTE — ED Notes (Signed)
Patient transported to CT 

## 2014-11-19 NOTE — ED Notes (Signed)
Pt presents with c/o fall and facial injury. Pt had just finished eating at [employer] and when she got up from the table, she caught the chair legs and fell face first. Pt has a skin tear to her right arm, a small laceration to the bridge of her nose, a skin tear to her left hand ring finger, and her teeth were knocked out of her partial. Denies any LOC. Pt is alert and oriented x 4.

## 2014-11-19 NOTE — ED Notes (Signed)
Pt got foot caught in chair at restaurant and fell face first and hit the floor. Injury to nose and mouth, pt has partials unknown if partials are still in place. Pt is on blood thinners and has been bleeding for about 40 minutes. Pt is alert and oriented.

## 2014-11-20 NOTE — Discharge Instructions (Signed)
Please follow the directions provided.    Be sure to follow-up with Dr. Benson Norway for further management of your facial fracture.    According to your CT:   There is avulsion of the 8 tooth from the maxilla with surrounding alveolar ridge fracture.  Fracture lines extend through the maxilla into the anterior lateral aspect of the left maxillary sinus.   There is a nondisplaced osseous nasal septal fracture.  Also, be sure to follow-up with your primary care provider regarding the incidental finding of a 2.3 cm soft tissue nodule within the left lobe of the thyroid. Recommend correlation with thyroid ultrasound.  Keep your wounds clean and dry and change dressing as needed.  Swish and spit with a water/peroxide mixture to help with healing. Take your meds as directed.  Don't hesitate to return for any new, worsening or concerning symptoms.     SEEK IMMEDIATE MEDICAL CARE IF:  You develop difficulty seeing or experience double vision.  You become dizzy, lightheaded, or faint.  You develop trouble speaking, breathing, or swallowing.  You have a watery discharge from your nose or ear.  SEEK IMMEDIATE MEDICAL CARE IF:  You have redness, swelling, or increasing pain in the skin tear.  You have pus coming from the skin tear.  You have chills.  You have a red streak that goes away from the skin tear.  You have a bad smell coming from the tear or dressing.  You have a fever or persistent symptoms for more than 2-3 days.  You have a fever and your symptoms suddenly get worse.

## 2014-11-21 ENCOUNTER — Other Ambulatory Visit: Payer: Medicare Other

## 2014-11-24 NOTE — Telephone Encounter (Signed)
I have tried the home number several times and its been busy, no answer on pt's cell phone. I called daughter Butch Penny and left a voice message for pt to call us back. I will try again later on.

## 2014-11-24 NOTE — Telephone Encounter (Signed)
Daughter in law called back b/c the doxycyline had the same effect. However, daughter states pt in not on any antibiotic and pt still has diarrhea. Pt states she has a drip down the back of her throat. Is there something over the counter she could take. Pt had the fall and knocked out her front teeth. Pt broke the bone that went to her sinuses. May be allergies.?? In the ct scan pt had, a 2.5 cm nodule was found on thyroid. Pt wants to know what she should do about this from here.  Also, does pt need to resc the liver ultrasound?  Pt had to cancel bc of the fall. pls advise from here. Daughter in law on dpr and she has been helping pt get through this time and would appreciate a cb Pt is having another tooth pulled out today that was loose from the fall

## 2014-11-24 NOTE — Telephone Encounter (Signed)
I spoke with pt and went over below advice.

## 2014-11-24 NOTE — Telephone Encounter (Signed)
Stop the Doxycycline. Let's try to avoid antibiotics right now. Use Align daily and Imodium AD as needd for the diarrhea. Try Claritin daily for the runny nose and PND. Yes she should reschedule the liver US. I will also schedule a thyroid US to look at the thyroid nodule.

## 2014-11-30 ENCOUNTER — Other Ambulatory Visit: Payer: Self-pay | Admitting: Cardiology

## 2014-12-01 NOTE — Telephone Encounter (Signed)
Rx has been sent to the pharmacy electronically. ° °

## 2014-12-09 ENCOUNTER — Telehealth: Payer: Self-pay | Admitting: Cardiology

## 2014-12-09 NOTE — Telephone Encounter (Signed)
Mrs. Coors wants to know if some of the medicine in which she is taking may be causing  her to fall . Does not have great balance . Please Call   Thanks

## 2014-12-09 NOTE — Telephone Encounter (Signed)
Sounds like a reasonable suggestion for further work-up.  Dr. Lemmie Evens

## 2014-12-09 NOTE — Telephone Encounter (Signed)
Pt had recent major fall w/ facial injuries 3 weeks ago, she has continued to have a couple of minor falls since then.  She c/o dizziness, instability, "wobbly" feeling. She denies syncopal events w/ these and said she generally just feels off balance.  Recently seen by PCP for bronchitis, sinus infection.  Meds shouldn't be cause, she states normal HR, dizziness not necessarily exacerbated w/ standing. Last BPs 176/62, 134/54, 167/65. She states she usually runs high and Dr. Ellyn Hack is aware of this.  Advised w/ the vertigo symptoms may be an inner ear issue and that PCP could recommend...would route to [facility] for addtl advice.

## 2014-12-12 ENCOUNTER — Ambulatory Visit (INDEPENDENT_AMBULATORY_CARE_PROVIDER_SITE_OTHER): Payer: Medicare Other | Admitting: Family Medicine

## 2014-12-12 ENCOUNTER — Encounter: Payer: Self-pay | Admitting: Family Medicine

## 2014-12-12 VITALS — BP 180/68 | HR 57 | Temp 98.0°F | Ht 62.0 in | Wt 136.0 lb

## 2014-12-12 DIAGNOSIS — E042 Nontoxic multinodular goiter: Secondary | ICD-10-CM

## 2014-12-12 DIAGNOSIS — R29818 Other symptoms and signs involving the nervous system: Secondary | ICD-10-CM

## 2014-12-12 DIAGNOSIS — R2689 Other abnormalities of gait and mobility: Secondary | ICD-10-CM

## 2014-12-12 NOTE — Progress Notes (Signed)
   Subjective:    Patient ID: [name], female    DOB: [birth year], 81 y.o.   MRN: [id number]  HPI Here to discuss loss of equilibrium and a tendency to fall over the past few months. She denies any room spinning sensation or other vertigo symptoms. No nausea or HAs. She was in the ER on 11-19-14 after she lost her balance and fell on her face. She fractured her alveolar ridge and her nose that day. She has been seeing her oral surgeon, Dr. Hoyt Koch for this. There was no LOC. She has continued to feel unsteady at times but has had no further falls. Her non-contrasted head CT was otherwise normal.    Review of Systems  Constitutional: Negative.   Respiratory: Negative.   Cardiovascular: Negative.   Neurological: Positive for dizziness. Negative for tremors, seizures, syncope, facial asymmetry, speech difficulty, weakness, light-headedness, numbness and headaches.       Objective:   Physical Exam  Constitutional: She is oriented to person, place, and time. She appears well-developed and well-nourished. No distress.  Her gait is steady  Cardiovascular: Normal rate, regular rhythm, normal heart sounds and intact distal pulses.   Pulmonary/Chest: Effort normal and breath sounds normal.  Neurological: She is alert and oriented to person, place, and time. She has normal reflexes. No cranial nerve deficit. She exhibits normal muscle tone. Coordination normal.          Assessment & Plan:  Her facial fractures are healing. She has been unsteady on her feet so we will refer her to Neurology to evaluate further. I suggested she always use a cane or walker.

## 2014-12-12 NOTE — Progress Notes (Signed)
Pre visit review using our clinic review tool, if applicable. No additional management support is needed unless otherwise documented below in the visit note. 

## 2014-12-13 ENCOUNTER — Telehealth: Payer: Self-pay | Admitting: Neurology

## 2014-12-13 NOTE — Telephone Encounter (Signed)
Pt was taken off the wait list to resch appt from 01-31-15 to 12-14-14

## 2014-12-14 ENCOUNTER — Encounter: Payer: Self-pay | Admitting: Neurology

## 2014-12-14 ENCOUNTER — Ambulatory Visit (INDEPENDENT_AMBULATORY_CARE_PROVIDER_SITE_OTHER): Payer: Medicare Other | Admitting: Neurology

## 2014-12-14 VITALS — BP 180/70 | HR 52 | Ht 62.0 in | Wt 138.2 lb

## 2014-12-14 DIAGNOSIS — W19XXXA Unspecified fall, initial encounter: Secondary | ICD-10-CM

## 2014-12-14 DIAGNOSIS — R2681 Unsteadiness on feet: Secondary | ICD-10-CM

## 2014-12-14 NOTE — Progress Notes (Signed)
NEUROLOGY CONSULTATION NOTE  [name] MRN: [id number] DOB: 1924/05/10  Referring provider: Dr. Sarajane Jews Primary care provider: Dr. Sarajane Jews  Reason for consult:  Gait instability  HISTORY OF PRESENT ILLNESS: [name] is an 81 year old right-handed woman with hypertension, paroxysmal atrial fibrillation on anticoagulation, hypothyroidism, state III CKD, who presents for loss of balance.  Records, labs and CT scans personally reviewed.  In October, she fell in the shower after slipping on the soap.  She hit the right side of her body and injuring her right shoulder.  She presented to the ED on 11/19/14 after she tripped on the leg of a chair in a restaraunt and fell on her face.  CT maxillary revealed that she fractured the alveolar ridge and her nose.  CT of the head showed no acute intracranial abnormality.  Since the accident, she has been very nervous and hesitant about ambulating.  She feels more unsteady.  She denies weakness in the legs.  She has history of low back pain and surgery with residual foot drop but denies pain down the legs.  She denies numbness in the feet.  She denies neck pain.  She denies associated dizziness, vertigo or lightheadedness associated with the falls.  Labs from 11/07/14 showed glucose 106, BUN 43, Cr 2.4, AST 117 and ALT 165.  PAST MEDICAL HISTORY: Past Medical History  Diagnosis Date  . Hyperlipidemia   . Hypothyroidism   . Arthritis   . Anemia   . Asthma   . Anxiety   . IBS (irritable bowel syndrome)   . Gout   . Colon polyps   . Tubulovillous adenoma 4/07  . Hemorrhoids   . Diverticulosis   . Chronic diarrhea   . Chronic kidney disease, stage IV (severe)   . Hypertensive heart disease     no significant RAS by MRA 2002- (<30% LRAS)   . Paroxysmal atrial fibrillation 11/01/2013    Intermittent through the years and recurrent in December of 2014 treated with amiodarone;; Negative Myoview 10/2013  . Peripheral vascular disease     123456  LICA, [id number] RICA by doppler 2009   . Benign positional vertigo   . LBBB (left bundle branch block)- new 11/01/13 11/01/2013  . Diastolic dysfunction, left ventricle 11/03/2013    PAST SURGICAL HISTORY: Past Surgical History  Procedure Laterality Date  . Lumbar disc surgery  2000  . Squamous cell skin cancer removed    . Oophorectomy  1962    right  . Appendectomy    . Low anterior bowel resection  7/08  . Cataract extraction    . Transthoracic echocardiogram  11/03/2013    Echocardiogram: EF 55-60%, mild LVH, elevated bili pressures. Mild aortic valve calcification.  Marland Kitchen Nm myoview ltd  11/02/2013    Negative for ischemia or infarction. EF 60%.    MEDICATIONS: Current Outpatient Prescriptions on File Prior to Visit  Medication Sig Dispense Refill  . amiodarone (PACERONE) 200 MG tablet Take 1 tablet (200 mg total) by mouth daily. 30 tablet 11  . Cyanocobalamin (VITAMIN B 12 PO) Take 1,000 mcg by mouth every morning.    Marland Kitchen ELIQUIS 2.5 MG TABS tablet TAKE 1 TABLET BY MOUTH TWICE A DAY 60 tablet 6  . furosemide (LASIX) 40 MG tablet Take 40 mg by mouth daily as needed for fluid.    . hydrALAZINE (APRESOLINE) 100 MG tablet Take 1 tablet (100 mg total) by mouth 3 (three) times daily. 90 tablet 11  . hyoscyamine (LEVSIN, ANASPAZ)  0.125 MG tablet Take 1-2 tablets by mouth or under tongue every 4 hours as needed before meals 360 tablet 5  . isosorbide mononitrate (IMDUR) 60 MG 24 hr tablet Take 1 tablet (60 mg total) by mouth daily. 30 tablet 6  . levothyroxine (SYNTHROID, LEVOTHROID) 88 MCG tablet Take 1 tablet (88 mcg total) by mouth daily before breakfast. 90 tablet 1  . LORazepam (ATIVAN) 0.5 MG tablet Take 1 tablet (0.5 mg total) by mouth every 8 (eight) hours as needed for anxiety. (Patient taking differently: Take 0.5 mg by mouth daily. ) 90 tablet 5  . nitroGLYCERIN (NITROSTAT) 0.4 MG SL tablet Place 1 tablet (0.4 mg total) under the tongue every 5 (five) minutes x 3 doses as needed for  chest pain. 25 tablet 3  . ondansetron (ZOFRAN) 4 MG tablet Take 1 tablet (4 mg total) by mouth every 6 (six) hours. 12 tablet 0  . PROAIR HFA 108 (90 BASE) MCG/ACT inhaler Inhale 2 puffs into the lungs every 4 (four) hours as needed. Severe cough/wheeze  0  . temazepam (RESTORIL) 30 MG capsule Take 1 capsule (30 mg total) by mouth at bedtime as needed for sleep. 30 capsule 5   No current facility-administered medications on file prior to visit.    ALLERGIES: Allergies  Allergen Reactions  . Ambien [Zolpidem Tartrate]     " made her crazy "  . Amoxicillin     REACTION: unspecified  . Ceftin [Cefuroxime Axetil] Diarrhea  . Codeine Phosphate     REACTION: unspecified  . Colchicine     Severe diarrhea  . Lipitor [Atorvastatin] Other (See Comments)    "makes legs jump all night"  . Penicillins     REACTION: nausea, swelling  . Xifaxan [Rifaximin] Other (See Comments)    unknown    FAMILY HISTORY: Family History  Problem Relation Age of Onset  . Cancer Mother 34  . Heart attack Father   . Heart disease Brother   . Stroke Maternal Grandmother   . Cancer Maternal Grandfather     Lung cancer  . Stroke Paternal Grandmother   . Cancer Daughter     SOCIAL HISTORY: History   Social History  . Marital Status: Widowed    Spouse Name: N/A    Number of Children: N/A  . Years of Education: N/A   Occupational History  . Not on file.   Social History Main Topics  . Smoking status: Never Smoker   . Smokeless tobacco: Never Used     Comment: never used tobacco  . Alcohol Use: No  . Drug Use: No  . Sexual Activity: No   Other Topics Concern  . Not on file   Social History Narrative   She is a widowed mother of 2, one child is deceased. Grandmother of one. She appears to be working out routinely at [employer] using [employer]. She is otherwise quite active.   She does not drink alcohol, does not smoke.  She is retired from [employer].    Lives alone  in a one story condo.    REVIEW OF SYSTEMS: Constitutional: No fevers, chills, or sweats, no generalized fatigue, change in appetite Eyes: No visual changes, double vision, eye pain Ear, nose and throat: No hearing loss, ear pain, nasal congestion, sore throat Cardiovascular: No chest pain, palpitations Respiratory:  No shortness of breath at rest or with exertion, wheezes GastrointestinaI: No nausea, vomiting, diarrhea, abdominal pain, fecal incontinence Genitourinary:  No dysuria,  urinary retention or frequency Musculoskeletal:  Back pain Integumentary: No rash, pruritus, skin lesions Neurological: as above Psychiatric: No depression, insomnia, anxiety Endocrine: No palpitations, fatigue, diaphoresis, mood swings, change in appetite, change in weight, increased thirst Hematologic/Lymphatic:  No anemia, purpura, petechiae. Allergic/Immunologic: no itchy/runny eyes, nasal congestion, recent allergic reactions, rashes  PHYSICAL EXAM: Filed Vitals:   12/14/14 1504  BP: 180/70  Pulse: 52   General: No acute distress Head:  Normocephalic/atraumatic Eyes:  fundi unremarkable, without vessel changes, exudates, hemorrhages or papilledema. Neck: supple, no paraspinal tenderness, full range of motion Back: No paraspinal tenderness Heart: regular rate and rhythm Lungs: Clear to auscultation bilaterally. Vascular: No carotid bruits. Neurological Exam: Mental status: alert and oriented to person, place, and time, recent and remote memory intact, fund of knowledge intact, attention and concentration intact, speech fluent and not dysarthric, language intact. Cranial nerves: CN I: not tested CN II: Right surgical pupil.  Left pupil round and reactive to light, visual fields intact, fundi unremarkable, without vessel changes, exudates, hemorrhages or papilledema. CN III, IV, VI:  full range of motion, no nystagmus, no ptosis CN V: facial sensation intact CN VII: upper and lower face  symmetric CN VIII: hearing intact CN IX, X: gag intact, uvula midline CN XI: sternocleidomastoid and trapezius muscles intact CN XII: tongue midline Bulk & Tone: normal, no fasciculations. Motor:  4+/5 left ankle dorsiflexion and 5-/5 right ankle dorsiflexion.  Otherwise 5/5. Sensation:  Mildly reduced vibration in toes.  Pinprick sensation intact. Deep Tendon Reflexes:  2+ and symmetric in upper extremities.  Absent in lower extremities.  Toes downgoing. Finger to nose testing:  No dysmetria Gait:  Wide-based gait.  Not ataxic.  Able to turn.  Unable to tandem walk. Romberg negative.  IMPRESSION: Gait instability with falls.  Both falls seem to be mechanical in nature.  She continues to feel unsteady, which I believe is related to lack of confidence after her injury.  She has some mild foot drop bilaterally, which is chronic and not new.  She likely has mild neuropathy in the feet as a result.  She exhibits no signs to suggest a CNS process in the brain or spinal cord.  PLAN: Will refer to PT.  She may follow up as needed.    Thank you for allowing me to take part in the care of this patient.  Metta Clines, DO  CC:  [name], MD

## 2014-12-14 NOTE — Patient Instructions (Signed)
1.  I recommend physical therapy for your gait and balance.  I also recommend that they perform a home safety evaluation as well. 2.  Follow up as needed.

## 2014-12-16 ENCOUNTER — Ambulatory Visit
Admission: RE | Admit: 2014-12-16 | Discharge: 2014-12-16 | Disposition: A | Discharge status: Home / Self Care | Payer: Medicare Other | Source: Ambulatory Visit | Attending: Family Medicine | Admitting: Family Medicine

## 2014-12-16 DIAGNOSIS — R748 Abnormal levels of other serum enzymes: Secondary | ICD-10-CM

## 2014-12-16 DIAGNOSIS — E041 Nontoxic single thyroid nodule: Secondary | ICD-10-CM

## 2014-12-16 NOTE — Addendum Note (Signed)
Addended by: [name] A on: 12/16/2014 02:10 PM   Modules accepted: Orders

## 2014-12-19 ENCOUNTER — Telehealth: Payer: Self-pay | Admitting: *Deleted

## 2014-12-19 NOTE — Telephone Encounter (Signed)
patient is aware of Neuro Rehab appt on 12/28/14 at 1:30pm address and telephone number was given

## 2014-12-20 ENCOUNTER — Telehealth: Payer: Self-pay | Admitting: Cardiology

## 2014-12-20 ENCOUNTER — Other Ambulatory Visit: Payer: Self-pay | Admitting: Family Medicine

## 2014-12-20 DIAGNOSIS — E042 Nontoxic multinodular goiter: Secondary | ICD-10-CM

## 2014-12-20 NOTE — Telephone Encounter (Signed)
Returned call to patient Dr.Harding advised ok to hold eliquis 48 hrs prior to procedure.

## 2014-12-20 NOTE — Telephone Encounter (Signed)
Yes - OK to stop 48 hr prior to procedure.  [place]

## 2014-12-20 NOTE — Telephone Encounter (Signed)
Pt is going to have a biopsy on her thyroids. They want to know if she can stop her Eliquis for a couple days or how many days [name] thinks. Please fax this [phone].[name]

## 2014-12-20 NOTE — Telephone Encounter (Signed)
Message sent to Dr.Harding for advice. 

## 2014-12-21 ENCOUNTER — Telehealth: Payer: Self-pay | Admitting: Cardiology

## 2014-12-21 NOTE — Telephone Encounter (Signed)
Received records from [facility] for appointment with [name] 01/10/15.  Records given to [facility] (medical records) for Dr Allison Quarry schedule on 01/10/15. lp

## 2014-12-26 ENCOUNTER — Encounter (HOSPITAL_COMMUNITY): Payer: Medicare Other

## 2014-12-28 ENCOUNTER — Ambulatory Visit (HOSPITAL_COMMUNITY)
Admission: RE | Admit: 2014-12-28 | Discharge: 2014-12-28 | Disposition: A | Discharge status: Home / Self Care | Payer: Medicare Other | Source: Ambulatory Visit | Attending: Cardiology | Admitting: Cardiology

## 2014-12-28 ENCOUNTER — Ambulatory Visit: Payer: Medicare Other | Attending: Neurology | Admitting: Physical Therapy

## 2014-12-28 ENCOUNTER — Encounter: Payer: Self-pay | Admitting: Physical Therapy

## 2014-12-28 DIAGNOSIS — N185 Chronic kidney disease, stage 5: Secondary | ICD-10-CM | POA: Diagnosis not present

## 2014-12-28 DIAGNOSIS — M1990 Unspecified osteoarthritis, unspecified site: Secondary | ICD-10-CM | POA: Insufficient documentation

## 2014-12-28 DIAGNOSIS — R269 Unspecified abnormalities of gait and mobility: Secondary | ICD-10-CM | POA: Insufficient documentation

## 2014-12-28 DIAGNOSIS — W19XXXA Unspecified fall, initial encounter: Secondary | ICD-10-CM

## 2014-12-28 DIAGNOSIS — Z79899 Other long term (current) drug therapy: Secondary | ICD-10-CM | POA: Insufficient documentation

## 2014-12-28 DIAGNOSIS — E039 Hypothyroidism, unspecified: Secondary | ICD-10-CM | POA: Diagnosis not present

## 2014-12-28 DIAGNOSIS — I120 Hypertensive chronic kidney disease with stage 5 chronic kidney disease or end stage renal disease: Secondary | ICD-10-CM | POA: Diagnosis not present

## 2014-12-28 DIAGNOSIS — E785 Hyperlipidemia, unspecified: Secondary | ICD-10-CM | POA: Insufficient documentation

## 2014-12-28 DIAGNOSIS — R2689 Other abnormalities of gait and mobility: Secondary | ICD-10-CM

## 2014-12-28 LAB — PULMONARY FUNCTION TEST
DL/VA % PRED: 70 %
DL/VA: 3.11 ml/min/mmHg/L
DLCO cor % pred: 51 %
DLCO cor: 10.51 ml/min/mmHg
DLCO unc % pred: 51 %
DLCO unc: 10.51 ml/min/mmHg
FEF 25-75 PRE: 0.9 L/s
FEF 25-75 Post: 0.91 L/sec
FEF2575-%CHANGE-POST: 0 %
FEF2575-%Pred-Post: 113 %
FEF2575-%Pred-Pre: 112 %
FEV1-%CHANGE-POST: 0 %
FEV1-%PRED-POST: 104 %
FEV1-%Pred-Pre: 104 %
FEV1-Post: 1.42 L
FEV1-Pre: 1.42 L
FEV1FVC-%Change-Post: 3 %
FEV1FVC-%PRED-PRE: 97 %
FEV6-%Change-Post: -3 %
FEV6-%Pred-Post: 111 %
FEV6-%Pred-Pre: 114 %
FEV6-POST: 1.94 L
FEV6-Pre: 2 L
FEV6FVC-%Change-Post: 0 %
FEV6FVC-%Pred-Post: 106 %
FEV6FVC-%Pred-Pre: 106 %
FVC-%Change-Post: -3 %
FVC-%PRED-PRE: 107 %
FVC-%Pred-Post: 104 %
FVC-POST: 1.95 L
FVC-Pre: 2.02 L
POST FEV1/FVC RATIO: 73 %
PRE FEV6/FVC RATIO: 99 %
Post FEV6/FVC ratio: 99 %
Pre FEV1/FVC ratio: 70 %
RV % PRED: 110 %
RV: 2.66 L
TLC % pred: 101 %
TLC: 4.68 L

## 2014-12-28 MED ORDER — ALBUTEROL SULFATE (2.5 MG/3ML) 0.083% IN NEBU
2.5000 mg | INHALATION_SOLUTION | Freq: Once | RESPIRATORY_TRACT | Status: AC
  Administered 2014-12-28: 2.5 mg via RESPIRATORY_TRACT

## 2014-12-28 NOTE — Therapy (Signed)
[facility] [address] [place], [place], [address] Phone: [phone]   Fax:  [phone]  Physical Therapy Evaluation  Patient Details  Name: [name] MRN: [id number] Date of Birth: Apr 14, 1924 Referring Provider:  [name], MD  Encounter Date: 12/28/2014      PT End of Session - 12/28/14 1641    Visit Number 1   Number of Visits 17   Date for PT Re-Evaluation 02/24/15   PT Start Time 1400   PT Stop Time 1445   PT Time Calculation (min) 45 min   Equipment Utilized During Treatment Gait belt   Activity Tolerance Patient tolerated treatment well   Behavior During Therapy [facility] for tasks assessed/performed      Past Medical History  Diagnosis Date  . Hyperlipidemia   . Hypothyroidism   . Arthritis   . Anemia   . Asthma   . Anxiety   . IBS (irritable bowel syndrome)   . Gout   . Colon polyps   . Tubulovillous adenoma 4/07  . Hemorrhoids   . Diverticulosis   . Chronic diarrhea   . Chronic kidney disease, stage IV (severe)   . Hypertensive heart disease     no significant RAS by MRA 2002- (<30% LRAS)   . Paroxysmal atrial fibrillation 11/01/2013    Intermittent through the years and recurrent in December of 2014 treated with amiodarone;; Negative Myoview 10/2013  . Peripheral vascular disease     123456 LICA, [id number] RICA by doppler 2009   . Benign positional vertigo   . LBBB (left bundle branch block)- new 11/01/13 11/01/2013  . Diastolic dysfunction, left ventricle 11/03/2013    Past Surgical History  Procedure Laterality Date  . Lumbar disc surgery  2000  . Squamous cell skin cancer removed    . Oophorectomy  1962    right  . Appendectomy    . Low anterior bowel resection  7/08  . Cataract extraction    . Transthoracic echocardiogram  11/03/2013    Echocardiogram: EF 55-60%, mild LVH, elevated bili pressures. Mild aortic valve calcification.  Marland Kitchen Nm myoview ltd  11/02/2013    Negative  for ischemia or infarction. EF 60%.    There were no vitals taken for this visit.  Visit Diagnosis:  Abnormality of gait - Plan: PT plan of care cert/re-cert  Balance problems - Plan: PT plan of care cert/re-cert  Falls, initial encounter - Plan: PT plan of care cert/re-cert      Subjective Assessment - 12/28/14 1403    Symptoms This 81yo female were referred for Physical Therapy evaluation due to fall 11/19/14. She reports tripped over chair leg. She broke nose & knocked 4 teeth. Medical workup did not show any other issues.   Patient Stated Goals She wants to walk better.   Currently in Pain? No/denies          [facility] PT Assessment - 12/28/14 1400    Assessment   Medical Diagnosis Gait Abnormality   Onset Date 11/19/14   Precautions   Precautions Fall   Restrictions   Weight Bearing Restrictions No   Balance Screen   Has the patient fallen in the past 6 months Yes   How many times? 2  tripped & slipped in tub (tore right rotator cuff /dominant)   Has the patient had a decrease in activity level because of a fear of falling?  No   Is the patient reluctant to leave their home because of  a fear of falling?  No   Home Environment   Living Enviornment Private residence   Living Arrangements Alone   Type of [place], first floor,    Home Access Stairs to enter   Entrance Stairs-Number of Steps 1   Entrance Stairs-Rails None   Home Layout One level   Home Equipment --  tub bar   Prior Function   Level of Independence Independent with basic ADLs;Independent with homemaking with ambulation;Independent with gait;Independent with transfers   Vocation Retired   [employer]   Overall Cognitive Status Within Functional Limits for tasks assessed   Observation/Other Assessments   Focus on Therapeutic Outcomes (FOTO)  61  Functional Status    Activities of Balance Confidence Scale (ABC Scale)  61.3%   Fear Avoidance Belief Questionnaire (FABQ)  19 (5)   PROM   Overall PROM   Within functional limits for tasks performed   Strength   Overall Strength Within functional limits for tasks performed  Tested in sitting; Left shoulder 3-/5   Left Ankle Dorsiflexion 3-/5   Ambulation/Gait   Ambulation/Gait Yes   Ambulation/Gait Assistance 5: Supervision   Ambulation Distance (Feet) 200 Feet   Assistive device None   Gait Pattern Left steppage;Step-through pattern;Decreased stance time - left;Decreased dorsiflexion - left;Poor foot clearance - left   Ambulation Surface Level;Indoor   Gait velocity 3.02 ft/se    Stairs Yes   Stairs Assistance 5: Supervision   Stair Management Technique One rail Right;Alternating pattern;Forwards   Number of Stairs 4   Ramp 5: Supervision   Curb 4: Min assist   [employer] Test   Sit to Stand Able to stand  independently using hands   Standing Unsupported Able to stand safely 2 minutes   Sitting with Back Unsupported but Feet Supported on Floor or Stool Able to sit safely and securely 2 minutes   Stand to Sit Controls descent by using hands   Transfers Able to transfer safely, definite need of hands   Standing Unsupported with Eyes Closed Unable to keep eyes closed 3 seconds but stays steady   Standing Ubsupported with Feet Together Needs help to attain position and unable to hold for 15 seconds   From Standing, Reach Forward with Outstretched Arm Can reach forward >5 cm safely (2")   From Standing Position, Pick up Object from Floor Able to pick up shoe, needs supervision   From Standing Position, Turn to Look Behind Over each Shoulder Turn sideways only but maintains balance   Turn 360 Degrees Needs close supervision or verbal cueing   Standing Unsupported, Alternately Place Feet on Step/Stool Able to complete 4 steps without aid or supervision   Standing Unsupported, One Foot in Front Able to take small step independently and hold 30 seconds   Standing on One Leg Tries to lift leg/unable to hold 3 seconds but remains standing  independently   Total Score 31   Timed Up and Go Test   Normal TUG (seconds) 12.55   Cognitive TUG (seconds) 13.16   Functional Gait  Assessment   Gait Level Surface Walks 20 ft in less than 7 sec but greater than 5.5 sec, uses assistive device, slower speed, mild gait deviations, or deviates 6-10 in outside of the 12 in walkway width.   Change in Gait Speed Able to change speed, demonstrates mild gait deviations, deviates 6-10 in outside of the 12 in walkway width, or no gait deviations, unable to achieve a major change in velocity, or  uses a change in velocity, or uses an assistive device.   Gait with Horizontal Head Turns Performs head turns smoothly with slight change in gait velocity (eg, minor disruption to smooth gait path), deviates 6-10 in outside 12 in walkway width, or uses an assistive device.   Gait with Vertical Head Turns Performs task with slight change in gait velocity (eg, minor disruption to smooth gait path), deviates 6 - 10 in outside 12 in walkway width or uses assistive device   Gait and Pivot Turn Turns slowly, requires verbal cueing, or requires several small steps to catch balance following turn and stop   Step Over Obstacle Is able to step over one shoe box (4.5 in total height) without changing gait speed. No evidence of imbalance.   Gait with Narrow Base of Support Ambulates less than 4 steps heel to toe or cannot perform without assistance.   Gait with Eyes Closed Walks 20 ft, slow speed, abnormal gait pattern, evidence for imbalance, deviates 10-15 in outside 12 in walkway width. Requires more than 9 sec to ambulate 20 ft.   Ambulating Backwards Walks 20 ft, slow speed, abnormal gait pattern, evidence for imbalance, deviates 10-15 in outside 12 in walkway width.   Steps Alternating feet, must use rail.   Total Score 15                          PT Education - 12/28/14 1641    Education provided Yes   Education Details evaluation findings and fall  risk   Person(s) Educated Patient   Methods Explanation   Comprehension Verbalized understanding          PT Short Term Goals - 12/28/14 1649    PT SHORT TERM GOAL #1   Title demonstrates understanding of initial HEP (Target Date: 01/27/15)   Time 4   Period Weeks   Status New   PT SHORT TERM GOAL #2   Title Berg Balance >40/56 (Target Date: 01/27/15)   Time 4   Period Weeks   Status New   PT SHORT TERM GOAL #3   Title Functional Gait Assessment >20/30 (Target Date: 01/27/15)   Time 4   Period Weeks   Status New           PT Long Term Goals - 12/28/14 1651    PT LONG TERM GOAL #1   Title Patient verbalizes understanding of ongoing fitness plan / progressive HEP (Target Date: 02/24/15)   Time 8   Period Weeks   Status New   PT LONG TERM GOAL #2   Title verbalizes fall prevention strategies within home (Target Date: 02/24/15)   Time 8   Period Weeks   Status New   PT LONG TERM GOAL #3   Title Berg Balance >/=45/56 (Target Date: 02/24/15)   Time 8   Period Weeks   Status New   PT LONG TERM GOAL #4   Title Functional Gait Assessment >/= 23/30  (Target Date: 02/24/15)   Time [place]   Status New               Plan - 12/28/14 1645    Clinical Impression Statement This 81yo female was functioning at high level prior to fall 11/19/14. She reports unsteadiness and decrease in activity level since that time. Her functional testing during PT evaluation indicates high risk of falls with Merrilee Jansky Balance 31/56 and Functional Gait Assessment 15/30. Patient would benefit from  skilled care at this time.   Pt will benefit from skilled therapeutic intervention in order to improve on the following deficits Abnormal gait;Decreased activity tolerance;Decreased balance;Decreased mobility;Decreased strength   Rehab Potential Good   PT Frequency 2x / week   PT Duration 8 weeks   PT Treatment/Interventions ADLs/Self Care Home Management;DME Instruction;Gait training;Stair  training;Functional mobility training;Therapeutic activities;Therapeutic exercise;Balance training;Neuromuscular re-education;Patient/family education;Other (comment)  orthotic consult   PT Next Visit Plan Sensory Organization Test, assess gait with [place] AFO   PT Home Exercise Plan balance   Consulted and Agree with Plan of Care Patient          G-Codes - January 15, 2015 1654    Functional Assessment Tool Used Merrilee Jansky Balance 31/56   Functional Limitation Mobility: Walking and moving around   Mobility: Walking and Moving Around Current Status [id number]) At least 40 percent but less than 60 percent impaired, limited or restricted   Mobility: Walking and Moving Around Goal Status [phone]) At least 20 percent but less than 40 percent impaired, limited or restricted       Problem List Patient Active Problem List   Diagnosis Date Noted  . Abnormal liver function tests 11/10/2014  . Systolic hypertension, isolated; difficult to control 10/04/2014  . CKD (chronic kidney disease), stage IV 07/31/2014  . Angina of effort 07/30/2014  . Tremor 07/26/2014  . Gout 06/17/2014  . Edema of both legs 02/09/2014  . On amiodarone therapy 12/25/2013  . Encounter for long-term (current) use of other medications 12/25/2013  . Benign hypertension with chronic kidney disease, stage III 12/25/2013  . Chest pain 12/03/2013  . Chronic diastolic heart failure [id number]  . H/O tachycardia-bradycardia syndrome   . Chronic anticoagulation- Eliquis 11/17/2013  . LBBB (left bundle branch block)- new 11/01/13 11/01/2013  . Paroxysmal atrial fibrillation 11/01/2013  . GERD (gastroesophageal reflux disease) 08/05/2013  . Peripheral vascular disease   . Benign positional vertigo   . Irritable bowel syndrome 03/22/2009  . Anxiety   . Reactive airway disease 01/13/2008  . Chronic anemia   . Osteoarthritis   . Hypothyroidism   . Hyperlipidemia   . Hypertensive heart disease     Aeriana Speece PT, DPT 01/15/15,  4:57 PM  [facility] [address] [place], [place], [address] Phone: [phone]   Fax:  [phone]

## 2015-01-04 ENCOUNTER — Other Ambulatory Visit: Payer: Medicare Other

## 2015-01-05 ENCOUNTER — Ambulatory Visit
Admission: RE | Admit: 2015-01-05 | Discharge: 2015-01-05 | Disposition: A | Discharge status: Home / Self Care | Payer: Medicare Other | Source: Ambulatory Visit | Attending: Family Medicine | Admitting: Family Medicine

## 2015-01-05 ENCOUNTER — Other Ambulatory Visit (HOSPITAL_COMMUNITY)
Admission: RE | Admit: 2015-01-05 | Discharge: 2015-01-05 | Disposition: A | Discharge status: Home / Self Care | Payer: Medicare Other | Source: Ambulatory Visit | Attending: Interventional Radiology | Admitting: Interventional Radiology

## 2015-01-05 DIAGNOSIS — E042 Nontoxic multinodular goiter: Secondary | ICD-10-CM

## 2015-01-05 DIAGNOSIS — E041 Nontoxic single thyroid nodule: Secondary | ICD-10-CM | POA: Insufficient documentation

## 2015-01-06 ENCOUNTER — Other Ambulatory Visit: Payer: Self-pay | Admitting: Family Medicine

## 2015-01-09 ENCOUNTER — Ambulatory Visit: Payer: Medicare Other | Admitting: Cardiology

## 2015-01-10 ENCOUNTER — Ambulatory Visit (INDEPENDENT_AMBULATORY_CARE_PROVIDER_SITE_OTHER): Payer: Medicare Other | Admitting: Cardiology

## 2015-01-10 ENCOUNTER — Encounter: Payer: Self-pay | Admitting: Cardiology

## 2015-01-10 VITALS — BP 182/50 | HR 56 | Ht 62.0 in | Wt 134.9 lb

## 2015-01-10 DIAGNOSIS — Z7901 Long term (current) use of anticoagulants: Secondary | ICD-10-CM

## 2015-01-10 DIAGNOSIS — I10 Essential (primary) hypertension: Secondary | ICD-10-CM

## 2015-01-10 DIAGNOSIS — I119 Hypertensive heart disease without heart failure: Secondary | ICD-10-CM

## 2015-01-10 DIAGNOSIS — Z8679 Personal history of other diseases of the circulatory system: Secondary | ICD-10-CM

## 2015-01-10 DIAGNOSIS — I480 Paroxysmal atrial fibrillation: Secondary | ICD-10-CM

## 2015-01-10 NOTE — Patient Instructions (Addendum)
NO CHANGES TO MEDICATION    Your physician wants you to follow-up in 3 Month DR HARDING.--30 MIN APPOINTMENT.  You will receive a reminder letter in the mail two months in advance. If you don't receive a letter, please call our office to schedule the follow-up appointment.

## 2015-01-11 ENCOUNTER — Encounter: Payer: Self-pay | Admitting: Physical Therapy

## 2015-01-11 ENCOUNTER — Ambulatory Visit: Payer: Medicare Other | Attending: Neurology | Admitting: Physical Therapy

## 2015-01-11 DIAGNOSIS — E785 Hyperlipidemia, unspecified: Secondary | ICD-10-CM | POA: Insufficient documentation

## 2015-01-11 DIAGNOSIS — R269 Unspecified abnormalities of gait and mobility: Secondary | ICD-10-CM | POA: Diagnosis not present

## 2015-01-11 DIAGNOSIS — N185 Chronic kidney disease, stage 5: Secondary | ICD-10-CM | POA: Insufficient documentation

## 2015-01-11 DIAGNOSIS — W19XXXA Unspecified fall, initial encounter: Secondary | ICD-10-CM

## 2015-01-11 DIAGNOSIS — I120 Hypertensive chronic kidney disease with stage 5 chronic kidney disease or end stage renal disease: Secondary | ICD-10-CM | POA: Insufficient documentation

## 2015-01-11 DIAGNOSIS — E039 Hypothyroidism, unspecified: Secondary | ICD-10-CM | POA: Diagnosis not present

## 2015-01-11 DIAGNOSIS — R2689 Other abnormalities of gait and mobility: Secondary | ICD-10-CM

## 2015-01-11 DIAGNOSIS — M1990 Unspecified osteoarthritis, unspecified site: Secondary | ICD-10-CM | POA: Diagnosis not present

## 2015-01-11 NOTE — Therapy (Signed)
[facility] [address] [place], [place], [address] Phone: [phone]   Fax:  [phone]  Physical Therapy Treatment  Patient Details  Name: [name] MRN: [id number] Date of Birth: 08-31-1924 Referring Provider:  [name], MD  Encounter Date: 01/11/2015      PT End of Session - 01/11/15 1200    Visit Number 2   Number of Visits 17   Date for PT Re-Evaluation 02/24/15   PT Start Time 1100   PT Stop Time 1147   PT Time Calculation (min) 47 min   Equipment Utilized During Treatment Gait belt   Activity Tolerance Patient tolerated treatment well   Behavior During Therapy [facility] for tasks assessed/performed      Past Medical History  Diagnosis Date  . Hyperlipidemia   . Hypothyroidism   . Arthritis   . Anemia   . Asthma   . Anxiety   . IBS (irritable bowel syndrome)   . Gout   . Colon polyps   . Tubulovillous adenoma 4/07  . Hemorrhoids   . Diverticulosis   . Chronic diarrhea   . Chronic kidney disease, stage IV (severe)   . Hypertensive heart disease     no significant RAS by MRA 2002- (<30% LRAS)   . Paroxysmal atrial fibrillation 11/01/2013    Intermittent through the years and recurrent in December of 2014 treated with amiodarone;; Negative Myoview 10/2013  . Peripheral vascular disease     123456 LICA, [id number] RICA by doppler 2009   . Benign positional vertigo   . LBBB (left bundle branch block)- new 11/01/13 11/01/2013  . Diastolic dysfunction, left ventricle 11/03/2013    Past Surgical History  Procedure Laterality Date  . Lumbar disc surgery  2000  . Squamous cell skin cancer removed    . Oophorectomy  1962    right  . Appendectomy    . Low anterior bowel resection  7/08  . Cataract extraction    . Transthoracic echocardiogram  11/03/2013    Echocardiogram: EF 55-60%, mild LVH, elevated bili pressures. Mild aortic valve calcification.  Marland Kitchen Nm myoview ltd  11/02/2013    Negative  for ischemia or infarction. EF 60%.    There were no vitals taken for this visit.  Visit Diagnosis:  Abnormality of gait  Balance problems  Falls, initial encounter      Subjective Assessment - 01/11/15 1158    Symptoms No issues since evaluations   Currently in Pain? No/denies          [facility] PT Assessment - 01/11/15 1100    Balance Master Testing    Results Composite 48, Vestibular ~1%,   Hip strategy 0% in falls, COG forward on toes/loss anterior     Neuromuscular Re-ed: SOT with review of results. Initiated vestibular exercises for HEP                     PT Education - 01/11/15 1159    Education provided Yes   Education Details results of Sensory Organization Test & implications, started HEP in corner   Person(s) Educated Patient   Methods Explanation;Demonstration;Handout   Comprehension Verbalized understanding;Returned demonstration;Need further instruction          PT Short Term Goals - 12/28/14 1649    PT SHORT TERM GOAL #1   Title demonstrates understanding of initial HEP (Target Date: 01/27/15)   Time 4   Period Weeks   Status New   PT SHORT  TERM GOAL #2   Title Berg Balance >40/56 (Target Date: 01/27/15)   Time 4   Period Weeks   Status New   PT SHORT TERM GOAL #3   Title Functional Gait Assessment >20/30 (Target Date: 01/27/15)   Time 4   Period Weeks   Status New           PT Long Term Goals - 12/28/14 1651    PT LONG TERM GOAL #1   Title Patient verbalizes understanding of ongoing fitness plan / progressive HEP (Target Date: 02/24/15)   Time 8   Period Weeks   Status New   PT LONG TERM GOAL #2   Title verbalizes fall prevention strategies within home (Target Date: 02/24/15)   Time 8   Period Weeks   Status New   PT LONG TERM GOAL #3   Title Berg Balance >/=45/56 (Target Date: 02/24/15)   Time 8   Period Weeks   Status New   PT LONG TERM GOAL #4   Title Functional Gait Assessment >/= 23/30  (Target Date: 02/24/15)    Time 8   Period Weeks   Status New               Plan - 01/11/15 1200    Clinical Impression Statement Sensory Organization Test indicates minimal to no vestibular input to balance, no hip strategy in fall situations and weight forward on feet with anterior balance losses.   Pt will benefit from skilled therapeutic intervention in order to improve on the following deficits Abnormal gait;Decreased activity tolerance;Decreased balance;Decreased mobility;Decreased strength   Rehab Potential Good   PT Frequency 2x / week   PT Duration 8 weeks   PT Treatment/Interventions ADLs/Self Care Home Management;DME Instruction;Gait training;Stair training;Functional mobility training;Therapeutic activities;Therapeutic exercise;Balance training;Neuromuscular re-education;Patient/family education;Other (comment)  orthotic consult   PT Next Visit Plan Add to HEP & review initial, assess gait with [place] AFO   PT Home Exercise Plan review initial vestibular & add foam eyes closed, alternate kicks, wall bumps, additional balance exercises for hip strategy and mid-foot wt distribution   Consulted and Agree with Plan of Care Patient        Problem List Patient Active Problem List   Diagnosis Date Noted  . Abnormal liver function tests 11/10/2014  . Systolic hypertension, isolated; difficult to control 10/04/2014  . CKD (chronic kidney disease), stage IV 07/31/2014  . Angina of effort 07/30/2014  . Tremor 07/26/2014  . Gout 06/17/2014  . Edema of both legs 02/09/2014  . On amiodarone therapy 12/25/2013  . Encounter for long-term (current) use of other medications 12/25/2013  . Benign hypertension with chronic kidney disease, stage III 12/25/2013  . Chest pain 12/03/2013  . Chronic diastolic heart failure 123XX123  . H/O tachycardia-bradycardia syndrome   . Chronic anticoagulation- Eliquis 11/17/2013  . LBBB (left bundle branch block)- new 11/01/13 11/01/2013  . Paroxysmal atrial  fibrillation 11/01/2013  . GERD (gastroesophageal reflux disease) 08/05/2013  . Peripheral vascular disease   . Benign positional vertigo   . Irritable bowel syndrome 03/22/2009  . Anxiety   . Reactive airway disease 01/13/2008  . Chronic anemia   . Osteoarthritis   . Hypothyroidism   . Hyperlipidemia   . Hypertensive heart disease     Shamarra Warda PT, DPT 01/11/2015, 12:05 PM  [facility] [address] [place], [place], [address] Phone: [phone]   Fax:  [phone]

## 2015-01-11 NOTE — Patient Instructions (Signed)
Feet Together, Head Motion - Eyes Open   With eyes open, feet together, move head slowly: up and down, right /left, diagonals (up-right to down-left and up-left to down-right). Repeat _10-15___ times each direction per session. Do _1-2___ sessions per day.  Copyright  VHI. All rights reserved.  Feet Apart, Head Motion - Eyes Closed   With eyes closed and feet shoulder width apart, move head slowly, up and down,  right /left, diagonals (up-right to down-left and up-left to down-right). Repeat __10-15__ times each direction per session. Do ___1-2_ sessions per day.  Copyright  VHI. All rights reserved.  Feet Apart (Compliant Surface) Head Motion - Eyes Open   With eyes open, standing on compliant surface: ___pillow_____, feet shoulder width apart, move head slowly: up and down,  right /left, diagonals (up-right to down-left and up-left to down-right). Repeat _10-15___ times each direction per session. Do __1-2__ sessions per day.  Copyright  VHI. All rights reserved.

## 2015-01-12 NOTE — Progress Notes (Signed)
[facility]  PATIENT: [name] MRN: [id number]  DOB: 1923/12/22   DOV:01/13/2015 PCP: [name], MD  Clinic Note: Chief Complaint  Patient presents with  . Follow-up     PFT  DONE- fell in JAN 2016  AT [employer] ( BROKE NOSE, TEETH , SKIN TEAR)--2 month visit,no chest pain , no sob , edema   HPI: [name] is a 81 y.o.  female with a PMH below who presents today for followup of atrial fibrillation with diastolic heart failure & difficult to control/labile HTN. I saw her in November. She is also seeing Tommy Medal, RPH-CCP on several occasions.  Interval History:  Today she presents really without any major cardiac symptoms. Her biggest concern is that she had a fall where she busted her face while at a restaurant. She has not had any cardiac symptoms and she's not had any problems with balance. No syncope/near syncope or TIA/amaurosis fugax. Despite her elevated blood pressure, she denies any headaches or blurred vision, dizziness or dyspnea. No chest tightness or pressure with rest or exertion. No PND, orthopnea or edema. She denies any rapid or irregular heartbeats to suggest recurrence of A. fib.  Past Medical History  Diagnosis Date  . Hyperlipidemia   . Hypothyroidism   . Arthritis   . Anemia   . Asthma   . Anxiety   . IBS (irritable bowel syndrome)   . Gout   . Colon polyps   . Tubulovillous adenoma 4/07  . Hemorrhoids   . Diverticulosis   . Chronic diarrhea   . Chronic kidney disease, stage IV (severe)   . Hypertensive heart disease     no significant RAS by MRA 2002- (<30% LRAS)   . Paroxysmal atrial fibrillation 11/01/2013    Intermittent through the years and recurrent in December of 2014 treated with amiodarone;; Negative Myoview 10/2013  . Peripheral vascular disease     123456 LICA, [id number] RICA by doppler 2009   . Benign positional vertigo   . LBBB (left bundle branch block)- new 11/01/13 11/01/2013  . Diastolic dysfunction, left ventricle 11/03/2013     Prior Cardiac Evaluation and Procedural History: Procedure Laterality Date  . Transthoracic echocardiogram  11/03/2013    Echocardiogram: EF 55-60%, mild LVH, elevated bili pressures. Mild aortic valve calcification.  Marland Kitchen Nm myoview ltd  11/02/2013    Negative for ischemia or infarction. EF 60%.    Allergies  Allergen Reactions  . Ambien [Zolpidem Tartrate]     " made her crazy "  . Amoxicillin     REACTION: unspecified  . Ceftin [Cefuroxime Axetil] Diarrhea  . Codeine Phosphate     REACTION: unspecified  . Colchicine     Severe diarrhea  . Lipitor [Atorvastatin] Other (See Comments)    "makes legs jump all night"  . Penicillins     REACTION: nausea, swelling  . Xifaxan [Rifaximin] Other (See Comments)    unknown   Current Outpatient Prescriptions  Medication Sig Dispense Refill  . amiodarone (PACERONE) 200 MG tablet Take 200 mg by mouth daily.      Marland Kitchen amLODipine (NORVASC) 5 MG tablet Take 1 tablet (5 mg total) by mouth daily.  30 tablet  0  . apixaban (ELIQUIS) 2.5 MG TABS tablet Take 1 tablet (2.5 mg total) by mouth 2 (two) times daily.  60 tablet  6  . cyanocobalamin 1000 MCG tablet Take 1 tablet (1,000 mcg total) by mouth daily. Contact PCP for refills.  30 tablet  11  .  hyoscyamine (LEVSIN, ANASPAZ) 0.125 MG tablet Take 1-2 tablets by mouth or under tongue every 4 hours as needed before meals  360 tablet  5  . isosorbide mononitrate (IMDUR) 30 MG 24 hr tablet Take 1 tablet (30 mg total) by mouth daily.  30 tablet  0  . levothyroxine (SYNTHROID, LEVOTHROID) 88 MCG tablet Take 88 mcg by mouth daily before breakfast.      . omeprazole (PRILOSEC) 40 MG capsule Take 1 capsule (40 mg total) by mouth daily.  30 capsule  11  . temazepam (RESTORIL) 30 MG capsule Take 30 mg by mouth at bedtime as needed for sleep (restless leg).       No current facility-administered medications for this visit.    History   Social History Narrative   She is a widowed mother of 2, one child  is deceased. Grandmother of one. She appears to be working out routinely at [employer] using [employer]. She is otherwise quite active.   She does not drink alcohol, does not smoke.  She is retired from [employer].    Lives alone in a one story condo.   ROS: A comprehensive Review of Systems - was performed Review of Systems  Constitutional: Negative for malaise/fatigue.  HENT: Negative for nosebleeds.   Eyes: Negative for blurred vision and double vision.  Respiratory: Negative for cough, shortness of breath and wheezing.   Cardiovascular: Negative for palpitations and claudication.  Gastrointestinal: Negative for blood in stool and melena.  Genitourinary: Negative for hematuria.  Musculoskeletal: Positive for falls (she tripped over the back of a chair at a restaurant, fell and broke her nose and several teeth had lots of bleeding.).  Neurological: Negative for dizziness, loss of consciousness and headaches.  Psychiatric/Behavioral: Positive for memory loss. Negative for depression.  All other systems reviewed and are negative.  PHYSICAL EXAM BP 182/50 mmHg  Pulse 56  Ht 5\' 2"  (1.575 m)  Wt 134 lb 14.4 oz (61.19 kg)  BMI 24.67 kg/m2 General appearance: alert, cooperative, appears stated age, no distress and Well-nourished and well-groomed. Neck: no adenopathy, soft bilateral carotid bruits, and no JVD Lungs: clear to auscultation bilaterally, normal percussion bilaterally and Nonlabored, good air movement Heart: Bradycardic with regular rhythm. Split S2 but no M/R/G.Marland Kitchen Nondisplaced PMI. Abdomen: soft, non-tender; bowel sounds normal; no masses,  no organomegaly Extremities: edema  1+ left greater than right, no ulcers, gangrene or trophic changes and varicose veins noted Pulses: 2+ and symmetric Neurologic: Grossly normal  EKG:  Not checked  ASSESSMENT / PLAN: Systolic hypertension, isolated; difficult to control She still has very high blood pressures,  but I really don't have many options to treat her with. She is asymptomatic and is tolerating the medication she is on. I don't have any more room to go with the medications that we're using. Unfortunately she did not tolerate amlodipine. She has a resting heart rate of 56 on amiodarone, making diltiazem verapamil or beta blockers less appealing.  If we truthfully want to improve her blood pressure was 187 medications we would need to reduce her amiodarone dose. She is relatively intolerant of A. fib and I would like to keep her out of it. Would also like to avoid orthostatic hypotension as she has isolated systolic hypertension.   Paroxysmal atrial fibrillation She seems to be maintaining sinus rhythm on amiodarone. It was supposed to be reduced back to 100 mg twice a day, but was not.  For now continue current regimen  to avoid A. fib recurrence. Consider reducing to 100 mg twice a day at her next Visit.   Hypertensive heart disease No active heart failure symptoms or diastolic heart failure symptoms despite her hypertension. I think she would be symptomatic if she goes in A. fib with RVR however.   H/O tachycardia-bradycardia syndrome The plan was to reduce the amiodarone. But she still on 200 twice a day. Would monitor heart rate closely and have low threshold for reducing the dose.   Chronic anticoagulation- Eliquis No active bleeding issues with ELIQUIS. She probably bled quite a bit with her fall however. I think it'll be fine for her to hold her couple days in that type of incident.     No orders of the defined types were placed in this encounter.   No orders of the defined types were placed in this encounter.    Followup:   3 months  DAVID W. Ellyn Hack, M.D., M.S. Interventional Cardiology [facility]

## 2015-01-13 NOTE — Assessment & Plan Note (Addendum)
She seems to be maintaining sinus rhythm on amiodarone. It was supposed to be reduced back to 100 mg twice a day, but was not.  For now continue current regimen to avoid A. fib recurrence. Consider reducing to 100 mg twice a day at her next Visit.

## 2015-01-13 NOTE — Assessment & Plan Note (Addendum)
She still has very high blood pressures, but I really don't have many options to treat her with. She is asymptomatic and is tolerating the medication she is on. I don't have any more room to go with the medications that we're using. Unfortunately she did not tolerate amlodipine. She has a resting heart rate of 56 on amiodarone, making diltiazem verapamil or beta blockers less appealing.  If we truthfully want to improve her blood pressure was 187 medications we would need to reduce her amiodarone dose. She is relatively intolerant of A. fib and I would like to keep her out of it. Would also like to avoid orthostatic hypotension as she has isolated systolic hypertension.

## 2015-01-13 NOTE — Assessment & Plan Note (Signed)
The plan was to reduce the amiodarone. But she still on 200 twice a day. Would monitor heart rate closely and have low threshold for reducing the dose.

## 2015-01-13 NOTE — Assessment & Plan Note (Signed)
No active heart failure symptoms or diastolic heart failure symptoms despite her hypertension. I think she would be symptomatic if she goes in A. fib with RVR however.

## 2015-01-13 NOTE — Assessment & Plan Note (Signed)
No active bleeding issues with ELIQUIS. She probably bled quite a bit with her fall however. I think it'll be fine for her to hold her couple days in that type of incident.

## 2015-01-16 ENCOUNTER — Encounter: Payer: Self-pay | Admitting: Physical Therapy

## 2015-01-16 ENCOUNTER — Ambulatory Visit: Payer: Medicare Other | Admitting: Physical Therapy

## 2015-01-16 DIAGNOSIS — R2689 Other abnormalities of gait and mobility: Secondary | ICD-10-CM

## 2015-01-16 DIAGNOSIS — R269 Unspecified abnormalities of gait and mobility: Secondary | ICD-10-CM

## 2015-01-16 DIAGNOSIS — W19XXXA Unspecified fall, initial encounter: Secondary | ICD-10-CM

## 2015-01-16 NOTE — Therapy (Signed)
[facility] [address] [place], [place], [address] Phone: [phone]   Fax:  [phone]  Physical Therapy Treatment  Patient Details  Name: [name] MRN: [id number] Date of Birth: 1924/06/24 Referring Provider:  [name], MD  Encounter Date: 01/16/2015      PT End of Session - 01/16/15 1339    Visit Number 3   Number of Visits 17   Date for PT Re-Evaluation 02/24/15   PT Start Time 1230   PT Stop Time 1315   PT Time Calculation (min) 45 min   Equipment Utilized During Treatment Gait belt   Activity Tolerance Patient tolerated treatment well   Behavior During Therapy [facility] for tasks assessed/performed      Past Medical History  Diagnosis Date  . Hyperlipidemia   . Hypothyroidism   . Arthritis   . Anemia   . Asthma   . Anxiety   . IBS (irritable bowel syndrome)   . Gout   . Colon polyps   . Tubulovillous adenoma 4/07  . Hemorrhoids   . Diverticulosis   . Chronic diarrhea   . Chronic kidney disease, stage IV (severe)   . Hypertensive heart disease     no significant RAS by MRA 2002- (<30% LRAS)   . Paroxysmal atrial fibrillation 11/01/2013    Intermittent through the years and recurrent in December of 2014 treated with amiodarone;; Negative Myoview 10/2013  . Peripheral vascular disease     123456 LICA, [id number] RICA by doppler 2009   . Benign positional vertigo   . LBBB (left bundle branch block)- new 11/01/13 11/01/2013  . Diastolic dysfunction, left ventricle 11/03/2013    Past Surgical History  Procedure Laterality Date  . Lumbar disc surgery  2000  . Squamous cell skin cancer removed    . Oophorectomy  1962    right  . Appendectomy    . Low anterior bowel resection  7/08  . Cataract extraction    . Transthoracic echocardiogram  11/03/2013    Echocardiogram: EF 55-60%, mild LVH, elevated bili pressures. Mild aortic valve calcification.  Marland Kitchen Nm myoview ltd  11/02/2013    Negative  for ischemia or infarction. EF 60%.    There were no vitals taken for this visit.  Visit Diagnosis:  Abnormality of gait  Balance problems  Falls, initial encounter      Subjective Assessment - 01/16/15 1236    Symptoms Reports that she was sick over weekend.  But did exercises a couple of times.   Currently in Pain? No/denies     Therapeutic Exercise: PT reviewed initial HEP of vesitbular exercises in corner: progressed foot position on floor with eyes open to offset together. Added wall bumps, ankle PF /DF alternate motions, alternate kicks - flexion, extension, abduction and sit to/from stand without use of UEs See patient education: PT instructed in returning to bike and walking around gym with rest breaks initially with returning to [facility]. Focus on increasing time first over intensity or speed. Take pictures of other machines that she used to use for PT to assess. Patient verbalized understanding.  NuStep Level 2 with 4 extremities at ~40spm for 5 minutes. Gait: PT tried [place] Walk-0n AFO but she wore slip on shoes that did not fit AFO correctly so it hurt her foot. PT instructed in single point cane use and benefits. Patient ambulated 200' with cues.  PT Education - 01/16/15 1338    Education provided Yes   Education Details returning to exercising at [facility], added more balance exercises to HEP - see patient instructions   Person(s) Educated Patient   Methods Explanation   Comprehension Verbalized understanding          PT Short Term Goals - 12/28/14 1649    PT SHORT TERM GOAL #1   Title demonstrates understanding of initial HEP (Target Date: 01/27/15)   Time 4   Period Weeks   Status New   PT SHORT TERM GOAL #2   Title Berg Balance >40/56 (Target Date: 01/27/15)   Time 4   Period Weeks   Status New   PT SHORT TERM GOAL #3   Title Functional Gait Assessment >20/30 (Target Date: 01/27/15)   Time 4   Period Weeks   Status  New           PT Long Term Goals - 12/28/14 1651    PT LONG TERM GOAL #1   Title Patient verbalizes understanding of ongoing fitness plan / progressive HEP (Target Date: 02/24/15)   Time 8   Period Weeks   Status New   PT LONG TERM GOAL #2   Title verbalizes fall prevention strategies within home (Target Date: 02/24/15)   Time 8   Period Weeks   Status New   PT LONG TERM GOAL #3   Title Berg Balance >/=45/56 (Target Date: 02/24/15)   Time 8   Period Weeks   Status New   PT LONG TERM GOAL #4   Title Functional Gait Assessment >/= 23/30  (Target Date: 02/24/15)   Time 8   Period Weeks   Status New               Plan - 01/16/15 1340    Clinical Impression Statement patient's gait was not improved by AFO with shoes that she wore today.    Pt will benefit from skilled therapeutic intervention in order to improve on the following deficits Abnormal gait;Decreased activity tolerance;Decreased balance;Decreased mobility;Decreased strength   Rehab Potential Good   PT Frequency 2x / week   PT Duration 8 weeks   PT Treatment/Interventions ADLs/Self Care Home Management;DME Instruction;Gait training;Stair training;Functional mobility training;Therapeutic activities;Therapeutic exercise;Balance training;Neuromuscular re-education;Patient/family education;Other (comment)  orthotic consult   PT Next Visit Plan continue towards STGs & update HEP   PT Home Exercise Plan review initial vestibular & add foam eyes closed, alternate kicks, wall bumps, additional balance exercises for hip strategy and mid-foot wt distribution   Consulted and Agree with Plan of Care Patient        Problem List Patient Active Problem List   Diagnosis Date Noted  . Abnormal liver function tests 11/10/2014  . Systolic hypertension, isolated; difficult to control 10/04/2014  . CKD (chronic kidney disease), stage IV 07/31/2014  . Angina of effort 07/30/2014  . Tremor 07/26/2014  . Gout 06/17/2014  .  Edema of both legs 02/09/2014  . On amiodarone therapy 12/25/2013  . Encounter for long-term (current) use of other medications 12/25/2013  . Benign hypertension with chronic kidney disease, stage III 12/25/2013  . Chest pain 12/03/2013  . Chronic diastolic heart failure 123XX123  . H/O tachycardia-bradycardia syndrome   . Chronic anticoagulation- Eliquis 11/17/2013  . LBBB (left bundle branch block)- new 11/01/13 11/01/2013  . Paroxysmal atrial fibrillation 11/01/2013  . GERD (gastroesophageal reflux disease) 08/05/2013  . Peripheral vascular disease   . Benign positional vertigo   . Irritable bowel  syndrome 03/22/2009  . Anxiety   . Reactive airway disease 01/13/2008  . Chronic anemia   . Osteoarthritis   . Hypothyroidism   . Hyperlipidemia   . Hypertensive heart disease     Madelynn Malson PT, DPT 01/16/2015, 1:49 PM  [facility] [address] [place], [place], [address] Phone: [phone]   Fax:  [phone]

## 2015-01-16 NOTE — Patient Instructions (Signed)
Toe / Heel Raise   Gently rock back on heels and raise toes. Then rock forward on toes and raise heels. Repeat sequence _10-15___ times per session. Do __1__ sessions per day.  Copyright  VHI. All rights reserved.  Weight Shift: Anterior / Posterior (Limits of Stability)   Slowly shift weight backward until toes begin to rise off floor. Return to starting position. Shift weight slowly forward until heels begin to rise off floor. Hold each position __2-3__ seconds. Repeat _10-15___ times per session. Do __1__ sessions per day.  Copyright  VHI. All rights reserved.  Flexion and Extension - Standing   Stand and support self while swinging uninvolved leg and hip forward, alternate legs, Do 10 times on each leg. Then repeat swing / kick backwards, alternating legs,  __10-15_ times on each leg.  Do _1__ times per day.  Copyright  VHI. All rights reserved.  Sit to Stand / Stand to Sit / Transfers   Sit on edge of a solid chair with arms, feet flat on floor. Lean forward over feet and stand up with hands on chair arms. Sit down slowly with hands on chair arms. Repeat __10_ times per session. Do _1-2___ sessions per day.  Copyright  VHI. All rights reserved.

## 2015-01-18 ENCOUNTER — Ambulatory Visit: Payer: Medicare Other | Admitting: Physical Therapy

## 2015-01-18 ENCOUNTER — Encounter: Payer: Self-pay | Admitting: Physical Therapy

## 2015-01-18 DIAGNOSIS — R2689 Other abnormalities of gait and mobility: Secondary | ICD-10-CM

## 2015-01-18 DIAGNOSIS — R269 Unspecified abnormalities of gait and mobility: Secondary | ICD-10-CM | POA: Diagnosis not present

## 2015-01-18 NOTE — Therapy (Signed)
[facility] [address] [place], [place], [address] Phone: [phone]   Fax:  [phone]  Physical Therapy Treatment  Patient Details  Name: [name] MRN: [id number] Date of Birth: 05/05/1924 Referring Provider:  [name], MD  Encounter Date: 01/18/2015      PT End of Session - 01/18/15 1237    Visit Number 4   Number of Visits 17   Date for PT Re-Evaluation 02/24/15   PT Start Time 1233   PT Stop Time 1315   PT Time Calculation (min) 42 min   Equipment Utilized During Treatment Gait belt   Activity Tolerance Patient tolerated treatment well   Behavior During Therapy [facility] for tasks assessed/performed      Past Medical History  Diagnosis Date  . Hyperlipidemia   . Hypothyroidism   . Arthritis   . Anemia   . Asthma   . Anxiety   . IBS (irritable bowel syndrome)   . Gout   . Colon polyps   . Tubulovillous adenoma 4/07  . Hemorrhoids   . Diverticulosis   . Chronic diarrhea   . Chronic kidney disease, stage IV (severe)   . Hypertensive heart disease     no significant RAS by MRA 2002- (<30% LRAS)   . Paroxysmal atrial fibrillation 11/01/2013    Intermittent through the years and recurrent in December of 2014 treated with amiodarone;; Negative Myoview 10/2013  . Peripheral vascular disease     123456 LICA, [id number] RICA by doppler 2009   . Benign positional vertigo   . LBBB (left bundle branch block)- new 11/01/13 11/01/2013  . Diastolic dysfunction, left ventricle 11/03/2013    Past Surgical History  Procedure Laterality Date  . Lumbar disc surgery  2000  . Squamous cell skin cancer removed    . Oophorectomy  1962    right  . Appendectomy    . Low anterior bowel resection  7/08  . Cataract extraction    . Transthoracic echocardiogram  11/03/2013    Echocardiogram: EF 55-60%, mild LVH, elevated bili pressures. Mild aortic valve calcification.  Marland Kitchen Nm myoview ltd  11/02/2013    Negative  for ischemia or infarction. EF 60%.    There were no vitals taken for this visit.  Visit Diagnosis:  Balance problems      Subjective Assessment - 01/18/15 1236    Symptoms No new complaints. No falls or pain to report. Has not been able to go to [facility] and take the pictures as yet. Did the new ex's once since last visit (which was just 2 days ago)   Currently in Pain? No/denies     Treatment: Neuro Re-ed In corner on pillow: - feet apart: eyes closed no head movement, eyes closed with head nods/shakes/diagonals both ways x 10 each with min guard assist to min assist for balance. - feet together: eyes closed no head movement, eyes closed with head nods/shakes/diagonals both ways x 10 each with up to min assist for balance.  In hallway: 50-60 foot pathway, performed 4 laps each - forward gait with head turns left/right every 2-3 steps with emphasis on maintaining straight pathway, min guard assist with veering noted with head turns - forward gait with head nods up/down every 2-3 steps with emphasis as above and up to min guard assist  Single leg stance activites with tall cones: up to min assist with cues on posture and weight shifting for stance stability  - alternating forward toe taps,  cross toe taps, forward double toe taps, cross double toe taps and flip over/up x 10 each with bil feet.  Exercise Nustep x 4 extremities level 2.0 with goal ~50 steps per minute x 8 minutes for strengthening and activity tolerance.        PT Short Term Goals - 12/28/14 1649    PT SHORT TERM GOAL #1   Title demonstrates understanding of initial HEP (Target Date: 01/27/15)   Time 4   Period Weeks   Status New   PT SHORT TERM GOAL #2   Title Berg Balance >40/56 (Target Date: 01/27/15)   Time 4   Period Weeks   Status New   PT SHORT TERM GOAL #3   Title Functional Gait Assessment >20/30 (Target Date: 01/27/15)   Time 4   Period Weeks   Status New           PT Long Term Goals - 12/28/14  1651    PT LONG TERM GOAL #1   Title Patient verbalizes understanding of ongoing fitness plan / progressive HEP (Target Date: 02/24/15)   Time 8   Period Weeks   Status New   PT LONG TERM GOAL #2   Title verbalizes fall prevention strategies within home (Target Date: 02/24/15)   Time 8   Period Weeks   Status New   PT LONG TERM GOAL #3   Title Berg Balance >/=45/56 (Target Date: 02/24/15)   Time 8   Period Weeks   Status New   PT LONG TERM GOAL #4   Title Functional Gait Assessment >/= 23/30  (Target Date: 02/24/15)   Time 8   Period Weeks   Status New               Plan - 01/18/15 1237    Clinical Impression Statement Pt making steady progress toward goals. Challenged with eyes closed activities and with single leg stance activities.   Pt will benefit from skilled therapeutic intervention in order to improve on the following deficits Abnormal gait;Decreased activity tolerance;Decreased balance;Decreased mobility;Decreased strength   Rehab Potential Good   PT Frequency 2x / week   PT Duration 8 weeks   PT Treatment/Interventions ADLs/Self Care Home Management;DME Instruction;Gait training;Stair training;Functional mobility training;Therapeutic activities;Therapeutic exercise;Balance training;Neuromuscular re-education;Patient/family education;Other (comment)  orthotic consult   PT Next Visit Plan continue with gait and balance.   PT Home Exercise Plan review initial vestibular & add foam eyes closed, alternate kicks, wall bumps, additional balance exercises for hip strategy and mid-foot wt distribution   Consulted and Agree with Plan of Care Patient        Problem List Patient Active Problem List   Diagnosis Date Noted  . Abnormal liver function tests 11/10/2014  . Systolic hypertension, isolated; difficult to control 10/04/2014  . CKD (chronic kidney disease), stage IV 07/31/2014  . Angina of effort 07/30/2014  . Tremor 07/26/2014  . Gout 06/17/2014  . Edema of  both legs 02/09/2014  . On amiodarone therapy 12/25/2013  . Encounter for long-term (current) use of other medications 12/25/2013  . Benign hypertension with chronic kidney disease, stage III 12/25/2013  . Chest pain 12/03/2013  . Chronic diastolic heart failure [id number]  . H/O tachycardia-bradycardia syndrome   . Chronic anticoagulation- Eliquis 11/17/2013  . LBBB (left bundle branch block)- new 11/01/13 11/01/2013  . Paroxysmal atrial fibrillation 11/01/2013  . GERD (gastroesophageal reflux disease) 08/05/2013  . Peripheral vascular disease   . Benign positional vertigo   . Irritable bowel syndrome 03/22/2009  .  Anxiety   . Reactive airway disease 01/13/2008  . Chronic anemia   . Osteoarthritis   . Hypothyroidism   . Hyperlipidemia   . Hypertensive heart disease     Willow Ora 01/18/2015, 1:12 PM  Willow Ora, PTA, [facility] [address] [place], [place] [address] [phone] 01/18/2015, 1:12 PM

## 2015-01-23 ENCOUNTER — Ambulatory Visit: Payer: Medicare Other | Admitting: Physical Therapy

## 2015-01-23 ENCOUNTER — Encounter: Payer: Self-pay | Admitting: Physical Therapy

## 2015-01-23 DIAGNOSIS — R269 Unspecified abnormalities of gait and mobility: Secondary | ICD-10-CM | POA: Diagnosis not present

## 2015-01-23 DIAGNOSIS — R2689 Other abnormalities of gait and mobility: Secondary | ICD-10-CM

## 2015-01-23 NOTE — Therapy (Signed)
[facility] [address] [place], [place], [address] Phone: [phone]   Fax:  [phone]  Physical Therapy Treatment  Patient Details  Name: [name] MRN: [id number] Date of Birth: [birth year] Referring Provider:  [name], MD  Encounter Date: 01/23/2015      PT End of Session - 01/23/15 1659    Visit Number 5   Number of Visits 17   Date for PT Re-Evaluation 02/24/15   PT Start Time 8588   PT Stop Time 1405   PT Time Calculation (min) 50 min   Equipment Utilized During Treatment Gait belt   Activity Tolerance Patient tolerated treatment well   Behavior During Therapy [facility] for tasks assessed/performed      Past Medical History  Diagnosis Date  . Hyperlipidemia   . Hypothyroidism   . Arthritis   . Anemia   . Asthma   . Anxiety   . IBS (irritable bowel syndrome)   . Gout   . Colon polyps   . Tubulovillous adenoma 4/07  . Hemorrhoids   . Diverticulosis   . Chronic diarrhea   . Chronic kidney disease, stage IV (severe)   . Hypertensive heart disease     no significant RAS by MRA 2002- (<30% LRAS)   . Paroxysmal atrial fibrillation 11/01/2013    Intermittent through the years and recurrent in December of 2014 treated with amiodarone;; Negative Myoview 10/2013  . Peripheral vascular disease     50-27% LICA, 7-41% RICA by doppler 2009   . Benign positional vertigo   . LBBB (left bundle branch block)- new 11/01/13 11/01/2013  . Diastolic dysfunction, left ventricle 11/03/2013    Past Surgical History  Procedure Laterality Date  . Lumbar disc surgery  2000  . Squamous cell skin cancer removed    . Oophorectomy  1962    right  . Appendectomy    . Low anterior bowel resection  7/08  . Cataract extraction    . Transthoracic echocardiogram  11/03/2013    Echocardiogram: EF 55-60%, mild LVH, elevated bili pressures. Mild aortic valve calcification.  Marland Kitchen Nm myoview ltd  11/02/2013    Negative  for ischemia or infarction. EF 60%.    There were no vitals filed for this visit.  Visit Diagnosis:  Balance problems  Abnormality of gait      Subjective Assessment - 01/23/15 1319    Symptoms No falls or issues.    Currently in Pain? No/denies            [facility] PT Assessment - 01/23/15 1320    Berg Balance Test   Sit to Stand Able to stand without using hands and stabilize independently   Standing Unsupported Able to stand safely 2 minutes   Sitting with Back Unsupported but Feet Supported on Floor or Stool Able to sit safely and securely 2 minutes   Stand to Sit Sits safely with minimal use of hands   Transfers Able to transfer safely, minor use of hands   Standing Unsupported with Eyes Closed Able to stand 10 seconds with supervision   Standing Ubsupported with Feet Together Able to place feet together independently and stand for 1 minute with supervision   From Standing, Reach Forward with Outstretched Arm Can reach forward >12 cm safely (5")   From Standing Position, Pick up Object from Floor Able to pick up shoe, needs supervision   From Standing Position, Turn to Look Behind Over each Shoulder Looks behind from both  sides and weight shifts well   Turn 360 Degrees Able to turn 360 degrees safely but slowly   Standing Unsupported, Alternately Place Feet on Step/Stool Able to complete 4 steps without aid or supervision   Standing Unsupported, One Foot in Front Able to take small step independently and hold 30 seconds   Standing on One Leg Tries to lift leg/unable to hold 3 seconds but remains standing independently   Total Score 43   Functional Gait  Assessment   Gait Level Surface Walks 20 ft in less than 5.5 sec, no assistive devices, good speed, no evidence for imbalance, normal gait pattern, deviates no more than 6 in outside of the 12 in walkway width.   Change in Gait Speed Able to smoothly change walking speed without loss of balance or gait deviation. Deviate no  more than 6 in outside of the 12 in walkway width.   Gait with Horizontal Head Turns Performs head turns smoothly with no change in gait. Deviates no more than 6 in outside 12 in walkway width   Gait with Vertical Head Turns Performs head turns with no change in gait. Deviates no more than 6 in outside 12 in walkway width.   Gait and Pivot Turn Pivot turns safely in greater than 3 sec and stops with no loss of balance, or pivot turns safely within 3 sec and stops with mild imbalance, requires small steps to catch balance.   Step Over Obstacle Is able to step over one shoe box (4.5 in total height) without changing gait speed. No evidence of imbalance.   Gait with Narrow Base of Support Ambulates less than 4 steps heel to toe or cannot perform without assistance.   Gait with Eyes Closed Walks 20 ft, uses assistive device, slower speed, mild gait deviations, deviates 6-10 in outside 12 in walkway width. Ambulates 20 ft in less than 9 sec but greater than 7 sec.   Ambulating Backwards Walks 20 ft, uses assistive device, slower speed, mild gait deviations, deviates 6-10 in outside 12 in walkway width.   Steps Alternating feet, must use rail.   Total Score 22     Neuromuscular Re-education: In parallel bars for occasional touch, mirror for visual feedback and PT manually cueing /minA: rocker board ant/post & lateral stabilization & wt shift; crossways on foam beam with head turns and alt stepping forward and back.              [facility] PT Treatment/Exercise - 01/23/15 1320    Ambulation/Gait   Ambulation/Gait Yes   Ambulation/Gait Assistance 5: Supervision   Ambulation/Gait Assistance Details foot-up orthosis improved foot clearance including barriers.   Ambulation Distance (Feet) 300 Feet  2X,    Assistive device None  foot-up orthosis   Gait Pattern Left steppage;Step-through pattern;Decreased stance time - left;Decreased dorsiflexion - left;Poor foot clearance - left   Ambulation  Surface Level;Indoor   Gait velocity 3.06 ft/se   3.06 ft/sec without foot-up orthosis and 3.22 ft/sec with   Stairs Yes   Stairs Assistance 5: Supervision   Stair Management Technique One rail Right;Alternating pattern;Forwards   Number of Stairs 4  2reps   Ramp 5: Supervision   Curb 4: Min assist;5: Supervision  MinA without orthosis, SBA with orthosis                PT Education - 01/23/15 1658    Education provided Yes   Education Details foot-up orthosis   Person(s) Educated Patient   Methods  Explanation;Demonstration;Other (comment)  information how to order   Comprehension Verbalized understanding;Need further instruction          PT Short Term Goals - 01/23/15 1702    PT SHORT TERM GOAL #1   Title demonstrates understanding of initial HEP (Target Date: 01/27/15)   Baseline MET 01/23/15   Time 4   Period Weeks   Status Achieved   PT SHORT TERM GOAL #2   Title Berg Balance >40/56 (Target Date: 01/27/15)   Baseline MET 01/23/15 Berg 43/56 (Initial was 31/56)   Time 4   Period Weeks   Status Achieved   PT SHORT TERM GOAL #3   Title Functional Gait Assessment >20/30 (Target Date: 01/27/15)   Baseline MET 01/23/15 FGA 22/30 (Initial was 15/30)   Time 4   Period Weeks   Status Achieved           PT Long Term Goals - 12/28/14 1651    PT LONG TERM GOAL #1   Title Patient verbalizes understanding of ongoing fitness plan / progressive HEP (Target Date: 02/24/15)   Time 8   Period Weeks   Status New   PT LONG TERM GOAL #2   Title verbalizes fall prevention strategies within home (Target Date: 02/24/15)   Time 8   Period Weeks   Status New   PT LONG TERM GOAL #3   Title Berg Balance >/=45/56 (Target Date: 02/24/15)   Time 8   Period Weeks   Status New   PT LONG TERM GOAL #4   Title Functional Gait Assessment >/= 23/30  (Target Date: 02/24/15)   Time 8   Period Weeks   Status New               Plan - 01/23/15 1659    Clinical Impression  Statement Patient' s gait was improved with use of foot-up orthosis. Patient reports she would consider it since it comes in beige and can be concealed. Patient reports that she could see a difference in her gait with the orthosis. Patient met all STGs/   Pt will benefit from skilled therapeutic intervention in order to improve on the following deficits Abnormal gait;Decreased activity tolerance;Decreased balance;Decreased mobility;Decreased strength   Rehab Potential Good   PT Frequency 2x / week   PT Duration 8 weeks   PT Treatment/Interventions ADLs/Self Care Home Management;DME Instruction;Gait training;Stair training;Functional mobility training;Therapeutic activities;Therapeutic exercise;Balance training;Neuromuscular re-education;Patient/family education;Other (comment)  orthotic consult   PT Next Visit Plan continue with gait with foot-up orthosis and balance.   Consulted and Agree with Plan of Care Patient        Problem List Patient Active Problem List   Diagnosis Date Noted  . Abnormal liver function tests 11/10/2014  . Systolic hypertension, isolated; difficult to control 10/04/2014  . CKD (chronic kidney disease), stage IV 07/31/2014  . Angina of effort 07/30/2014  . Tremor 07/26/2014  . Gout 06/17/2014  . Edema of both legs 02/09/2014  . On amiodarone therapy 12/25/2013  . Encounter for long-term (current) use of other medications 12/25/2013  . Benign hypertension with chronic kidney disease, stage III 12/25/2013  . Chest pain 12/03/2013  . Chronic diastolic heart failure 82/70/7867  . H/O tachycardia-bradycardia syndrome   . Chronic anticoagulation- Eliquis 11/17/2013  . LBBB (left bundle branch block)- new 11/01/13 11/01/2013  . Paroxysmal atrial fibrillation 11/01/2013  . GERD (gastroesophageal reflux disease) 08/05/2013  . Peripheral vascular disease   . Benign positional vertigo   . Irritable bowel syndrome 03/22/2009  .  Anxiety   . Reactive airway disease  01/13/2008  . Chronic anemia   . Osteoarthritis   . Hypothyroidism   . Hyperlipidemia   . Hypertensive heart disease     Thomasenia Dowse PT, DPT 01/23/2015, 5:05 PM  [facility] [address] [place], [place], [address] Phone: [phone]   Fax:  [phone]

## 2015-01-25 ENCOUNTER — Ambulatory Visit: Payer: Medicare Other | Admitting: Physical Therapy

## 2015-01-25 ENCOUNTER — Encounter: Payer: Self-pay | Admitting: Physical Therapy

## 2015-01-25 DIAGNOSIS — R269 Unspecified abnormalities of gait and mobility: Secondary | ICD-10-CM | POA: Diagnosis not present

## 2015-01-25 DIAGNOSIS — R2689 Other abnormalities of gait and mobility: Secondary | ICD-10-CM

## 2015-01-25 NOTE — Therapy (Signed)
[facility] [address] [place], [place], [address] Phone: [phone]   Fax:  [phone]  Physical Therapy Treatment  Patient Details  Name: [name] MRN: [id number] Date of Birth: 1924-01-29 Referring Provider:  [name], MD  Encounter Date: 01/25/2015      01/25/15 1238  PT Visits / Re-Eval  Visit Number 6  Number of Visits 17  Date for PT Re-Evaluation 02/24/15  PT Time Calculation  PT Start Time 9323  PT Stop Time 1315  PT Time Calculation (min) 40 min  PT - End of Session  Equipment Utilized During Treatment Gait belt  Activity Tolerance Patient tolerated treatment well  Behavior During Therapy [facility] for tasks assessed/performed     Past Medical History  Diagnosis Date  . Hyperlipidemia   . Hypothyroidism   . Arthritis   . Anemia   . Asthma   . Anxiety   . IBS (irritable bowel syndrome)   . Gout   . Colon polyps   . Tubulovillous adenoma 4/07  . Hemorrhoids   . Diverticulosis   . Chronic diarrhea   . Chronic kidney disease, stage IV (severe)   . Hypertensive heart disease     no significant RAS by MRA 2002- (<30% LRAS)   . Paroxysmal atrial fibrillation 11/01/2013    Intermittent through the years and recurrent in December of 2014 treated with amiodarone;; Negative Myoview 10/2013  . Peripheral vascular disease     55-73% LICA, 2-20% RICA by doppler 2009   . Benign positional vertigo   . LBBB (left bundle branch block)- new 11/01/13 11/01/2013  . Diastolic dysfunction, left ventricle 11/03/2013    Past Surgical History  Procedure Laterality Date  . Lumbar disc surgery  2000  . Squamous cell skin cancer removed    . Oophorectomy  1962    right  . Appendectomy    . Low anterior bowel resection  7/08  . Cataract extraction    . Transthoracic echocardiogram  11/03/2013    Echocardiogram: EF 55-60%, mild LVH, elevated bili pressures. Mild aortic valve calcification.  Marland Kitchen Nm  myoview ltd  11/02/2013    Negative for ischemia or infarction. EF 60%.    There were no vitals filed for this visit.  Visit Diagnosis:  Abnormality of gait  Balance problems   Treatment: Gait: Treadmill- gait trainer program performed. X 6 minutes level 1.0-1.2 mph with min assist toward end of program. Average step length: 48 cm on left, 46 cm on right; Coefficient of Variation: 17% left, 17% right; Time on each foot: 49% left, 51% right. Cues needed on stride/step length and left foot clearance.  115 feet without AD with foot up on left foot for increased toe clearance/DF.  Treadmill 1.2 mph with bil UE support x 2 minutes with the foot up brace on. Less cues needed for foot clearance and stride length.  Neuro Re-ed: Gait with head turns along long hallway - head up/down, left /right and diagonals both ways x 2 laps each with min guard assist and mild deviation from pathway noted.  On balance board: - with eyes closed: static hold, hold with head movements up/down, left/right and diagonals both ways with intermittent UE assist needed and up to min assist for balance.   - sit/stands with feet on board x 10 each way.       PT Short Term Goals - 01/23/15 1702    PT SHORT TERM GOAL #1   Title demonstrates  understanding of initial HEP (Target Date: 01/27/15)   Baseline MET 01/23/15   Time 4   Period Weeks   Status Achieved   PT SHORT TERM GOAL #2   Title Berg Balance >40/56 (Target Date: 01/27/15)   Baseline MET 01/23/15 Berg 43/56 (Initial was 31/56)   Time 4   Period Weeks   Status Achieved   PT SHORT TERM GOAL #3   Title Functional Gait Assessment >20/30 (Target Date: 01/27/15)   Baseline MET 01/23/15 FGA 22/30 (Initial was 15/30)   Time 4   Period Weeks   Status Achieved           PT Long Term Goals - 12/28/14 1651    PT LONG TERM GOAL #1   Title Patient verbalizes understanding of ongoing fitness plan / progressive HEP (Target Date: 02/24/15)   Time 8   Period  Weeks   Status New   PT LONG TERM GOAL #2   Title verbalizes fall prevention strategies within home (Target Date: 02/24/15)   Time 8   Period Weeks   Status New   PT LONG TERM GOAL #3   Title Berg Balance >/=45/56 (Target Date: 02/24/15)   Time 8   Period Weeks   Status New   PT LONG TERM GOAL #4   Title Functional Gait Assessment >/= 23/30  (Target Date: 02/24/15)   Time 8   Period Weeks   Status New        01/25/15 1238  Plan  Clinical Impression Statement Pt still unsure as to whether she will use the foot up brace despite it helping her foot drop. Pt given the information on how to order one. Pt making progress toward goals.  Pt will benefit from skilled therapeutic intervention in order to improve on the following deficits Abnormal gait;Decreased activity tolerance;Decreased balance;Decreased mobility;Decreased strength  Rehab Potential Good  PT Frequency 2x / week  PT Duration 8 weeks  PT Treatment/Interventions ADLs/Self Care Home Management;DME Instruction;Gait training;Stair training;Functional mobility training;Therapeutic activities;Therapeutic exercise;Balance training;Neuromuscular re-education;Patient/family education;Other (comment) (orthotic consult)  PT Next Visit Plan continue with gait with foot-up orthosis and balance.  Consulted and Agree with Plan of Care Patient    Problem List Patient Active Problem List   Diagnosis Date Noted  . Abnormal liver function tests 11/10/2014  . Systolic hypertension, isolated; difficult to control 10/04/2014  . CKD (chronic kidney disease), stage IV 07/31/2014  . Angina of effort 07/30/2014  . Tremor 07/26/2014  . Gout 06/17/2014  . Edema of both legs 02/09/2014  . On amiodarone therapy 12/25/2013  . Encounter for long-term (current) use of other medications 12/25/2013  . Benign hypertension with chronic kidney disease, stage III 12/25/2013  . Chest pain 12/03/2013  . Chronic diastolic heart failure 96/75/9163  . H/O  tachycardia-bradycardia syndrome   . Chronic anticoagulation- Eliquis 11/17/2013  . LBBB (left bundle branch block)- new 11/01/13 11/01/2013  . Paroxysmal atrial fibrillation 11/01/2013  . GERD (gastroesophageal reflux disease) 08/05/2013  . Peripheral vascular disease   . Benign positional vertigo   . Irritable bowel syndrome 03/22/2009  . Anxiety   . Reactive airway disease 01/13/2008  . Chronic anemia   . Osteoarthritis   . Hypothyroidism   . Hyperlipidemia   . Hypertensive heart disease     Willow Ora 01/26/2015, 10:10 PM  Willow Ora, PTA, [facility] [address] [place], [place] [address] [phone] 01/26/2015, 10:10 PM

## 2015-01-30 ENCOUNTER — Encounter: Payer: Self-pay | Admitting: Physical Therapy

## 2015-01-30 ENCOUNTER — Ambulatory Visit: Payer: Medicare Other | Admitting: Physical Therapy

## 2015-01-30 DIAGNOSIS — R2689 Other abnormalities of gait and mobility: Secondary | ICD-10-CM

## 2015-01-30 DIAGNOSIS — R269 Unspecified abnormalities of gait and mobility: Secondary | ICD-10-CM | POA: Diagnosis not present

## 2015-01-30 NOTE — Therapy (Signed)
[facility] [address] [place], [place], [address] Phone: [phone]   Fax:  [phone]  Physical Therapy Treatment  Patient Details  Name: [name] MRN: [id number] Date of Birth: 01/30/1924 Referring Provider:  [name], MD  Encounter Date: 01/30/2015      PT End of Session - 01/30/15 1452    Visit Number 7   Number of Visits 17   Date for PT Re-Evaluation 02/24/15   PT Start Time 1448   PT Stop Time 1530   PT Time Calculation (min) 42 min   Equipment Utilized During Treatment Gait belt   Activity Tolerance Patient tolerated treatment well   Behavior During Therapy [name] for tasks assessed/performed      Past Medical History  Diagnosis Date  . Hyperlipidemia   . Hypothyroidism   . Arthritis   . Anemia   . Asthma   . Anxiety   . IBS (irritable bowel syndrome)   . Gout   . Colon polyps   . Tubulovillous adenoma 4/07  . Hemorrhoids   . Diverticulosis   . Chronic diarrhea   . Chronic kidney disease, stage IV (severe)   . Hypertensive heart disease     no significant RAS by MRA 2002- (<30% LRAS)   . Paroxysmal atrial fibrillation 11/01/2013    Intermittent through the years and recurrent in December of 2014 treated with amiodarone;; Negative Myoview 10/2013  . Peripheral vascular disease     49-67% LICA, 5-91% RICA by doppler 2009   . Benign positional vertigo   . LBBB (left bundle branch block)- new 11/01/13 11/01/2013  . Diastolic dysfunction, left ventricle 11/03/2013    Past Surgical History  Procedure Laterality Date  . Lumbar disc surgery  2000  . Squamous cell skin cancer removed    . Oophorectomy  1962    right  . Appendectomy    . Low anterior bowel resection  7/08  . Cataract extraction    . Transthoracic echocardiogram  11/03/2013    Echocardiogram: EF 55-60%, mild LVH, elevated bili pressures. Mild aortic valve calcification.  Marland Kitchen Nm myoview ltd  11/02/2013    Negative  for ischemia or infarction. EF 60%.    There were no vitals filed for this visit.  Visit Diagnosis:  Abnormality of gait  Balance problems      Subjective Assessment - 01/30/15 1451    Symptoms No new compaints. No falls or pain to report. Reports she is tired today after a busy day: dentist, worked out at [employer] (bike, treadmill and [employer]), grocery store and then here.    Currently in Pain? No/denies          [name] PT Assessment - 01/30/15 1459    Functional Gait  Assessment   Gait Level Surface Walks 20 ft in less than 5.5 sec, no assistive devices, good speed, no evidence for imbalance, normal gait pattern, deviates no more than 6 in outside of the 12 in walkway width.   Change in Gait Speed Able to smoothly change walking speed without loss of balance or gait deviation. Deviate no more than 6 in outside of the 12 in walkway width.   Gait with Horizontal Head Turns Performs head turns smoothly with no change in gait. Deviates no more than 6 in outside 12 in walkway width   Gait with Vertical Head Turns Performs head turns with no change in gait. Deviates no more than 6 in outside 12 in walkway width.  Gait and Pivot Turn Pivot turns safely within 3 sec and stops quickly with no loss of balance.   Step Over Obstacle Is able to step over 2 stacked shoe boxes taped together (9 in total height) without changing gait speed. No evidence of imbalance.   Gait with Narrow Base of Support Ambulates 7-9 steps.   Gait with Eyes Closed Walks 20 ft, no assistive devices, good speed, no evidence of imbalance, normal gait pattern, deviates no more than 6 in outside 12 in walkway width. Ambulates 20 ft in less than 7 sec.   Ambulating Backwards Walks 20 ft, no assistive devices, good speed, no evidence for imbalance, normal gait   Steps Alternating feet, must use rail.   Total Score 28          [facility] Adult PT Treatment/Exercise - 01/30/15 1459    Ambulation/Gait   Ambulation/Gait  Assistance 5: Supervision   Ambulation/Gait Assistance Details no balance issues or toe scuffing noted. Pt does have foot slap noted with contact.   Ambulation Distance (Feet) 420 Feet  no brace   Assistive device None   Berg Balance Test   Sit to Stand Able to stand without using hands and stabilize independently   Standing Unsupported Able to stand safely 2 minutes   Sitting with Back Unsupported but Feet Supported on Floor or Stool Able to sit safely and securely 2 minutes   Stand to Sit Sits safely with minimal use of hands   Transfers Able to transfer safely, minor use of hands   Standing Unsupported with Eyes Closed Able to stand 10 seconds safely   Standing Ubsupported with Feet Together Able to place feet together independently and stand 1 minute safely   From Standing, Reach Forward with Outstretched Arm Can reach forward >12 cm safely (5")  8 inches   From Standing Position, Pick up Object from Floor Able to pick up shoe safely and easily   From Standing Position, Turn to Look Behind Over each Shoulder Looks behind one side only/other side shows less weight shift  left > right   Turn 360 Degrees Able to turn 360 degrees safely in 4 seconds or less  right 3.59 sec's, left 3.75 sec's   Standing Unsupported, Alternately Place Feet on Step/Stool Able to stand independently and safely and complete 8 steps in 20 seconds  14.09 sec's   Standing Unsupported, One Foot in Front Able to plae foot ahead of the other independently and hold 30 seconds   Standing on One Leg Tries to lift leg/unable to hold 3 seconds but remains standing independently   Total Score 50           PT Education - 01/30/15 1528    Education provided Yes   Education Details fall preventions   Person(s) Educated Patient   Methods Explanation;Handout   Comprehension Verbalized understanding          PT Short Term Goals - 01/23/15 1702    PT SHORT TERM GOAL #1   Title demonstrates understanding of  initial HEP (Target Date: 01/27/15)   Baseline MET 01/23/15   Time 4   Period Weeks   Status Achieved   PT SHORT TERM GOAL #2   Title Berg Balance >40/56 (Target Date: 01/27/15)   Baseline MET 01/23/15 Sharlene Motts 43/56 (Initial was 31/56)   Time 4   Period Weeks   Status Achieved   PT SHORT TERM GOAL #3   Title Functional Gait Assessment >20/30 (Target Date: 01/27/15)  Baseline MET 01/23/15 FGA 22/30 (Initial was 15/30)   Time 4   Period Weeks   Status Achieved           PT Long Term Goals - 01/30/15 1552    PT LONG TERM GOAL #1   Title Patient verbalizes understanding of ongoing fitness plan / progressive HEP (Target Date: 02/24/15)   Baseline 3/21- has returned to [place] #2   Title verbalizes fall prevention strategies within home (Target Date: 02/24/15)   Baseline 3/21- given information on this date   Status Partially Met   PT LONG TERM GOAL #3   Title Berg Balance >/=45/56 (Target Date: 02/24/15)   Baseline 3/21- met with score of 50/56   Status Achieved   PT LONG TERM GOAL #4   Title Functional Gait Assessment >/= 23/30  (Target Date: 02/24/15)   Baseline 3/21- met with score of 28/30   Status Achieved           Plan - 01/30/15 1453    Clinical Impression Statement Most LTG's met today. Pt making progress toward remaining one/ones.    Pt will benefit from skilled therapeutic intervention in order to improve on the following deficits Abnormal gait;Decreased activity tolerance;Decreased balance;Decreased mobility;Decreased strength   Rehab Potential Good   PT Frequency 2x / week   PT Duration 8 weeks   PT Treatment/Interventions ADLs/Self Care Home Management;DME Instruction;Gait training;Stair training;Functional mobility training;Therapeutic activities;Therapeutic exercise;Balance training;Neuromuscular re-education;Patient/family education;Other (comment)  orthotic consult   PT Next Visit Plan Assess any remaining goals and finalize  any questions for discharge on next visit   Consulted and Agree with Plan of Care Patient      Problem List Patient Active Problem List   Diagnosis Date Noted  . Abnormal liver function tests 11/10/2014  . Systolic hypertension, isolated; difficult to control 10/04/2014  . CKD (chronic kidney disease), stage IV 07/31/2014  . Angina of effort 07/30/2014  . Tremor 07/26/2014  . Gout 06/17/2014  . Edema of both legs 02/09/2014  . On amiodarone therapy 12/25/2013  . Encounter for long-term (current) use of other medications 12/25/2013  . Benign hypertension with chronic kidney disease, stage III 12/25/2013  . Chest pain 12/03/2013  . Chronic diastolic heart failure 41/58/3094  . H/O tachycardia-bradycardia syndrome   . Chronic anticoagulation- Eliquis 11/17/2013  . LBBB (left bundle branch block)- new 11/01/13 11/01/2013  . Paroxysmal atrial fibrillation 11/01/2013  . GERD (gastroesophageal reflux disease) 08/05/2013  . Peripheral vascular disease   . Benign positional vertigo   . Irritable bowel syndrome 03/22/2009  . Anxiety   . Reactive airway disease 01/13/2008  . Chronic anemia   . Osteoarthritis   . Hypothyroidism   . Hyperlipidemia   . Hypertensive heart disease     Willow Ora 01/30/2015, 3:53 PM  Willow Ora, PTA, [facility] [address] [place], [place] [address] [phone] 01/30/2015, 3:53 PM

## 2015-01-30 NOTE — Patient Instructions (Signed)

## 2015-01-31 ENCOUNTER — Ambulatory Visit: Payer: Medicare Other | Admitting: Neurology

## 2015-02-01 ENCOUNTER — Ambulatory Visit: Payer: Medicare Other | Admitting: Physical Therapy

## 2015-02-01 ENCOUNTER — Encounter: Payer: Self-pay | Admitting: Physical Therapy

## 2015-02-01 DIAGNOSIS — R269 Unspecified abnormalities of gait and mobility: Secondary | ICD-10-CM

## 2015-02-01 DIAGNOSIS — W19XXXA Unspecified fall, initial encounter: Secondary | ICD-10-CM

## 2015-02-01 DIAGNOSIS — R2689 Other abnormalities of gait and mobility: Secondary | ICD-10-CM

## 2015-02-02 NOTE — Therapy (Signed)
[facility] [address] [place], [place], [address] Phone: [phone]   Fax:  [phone]  Physical Therapy Treatment  Patient Details  Name: [name] MRN: [id number] Date of Birth: [birth year] Referring Provider:  [name], MD  Encounter Date: 02/01/2015      PT End of Session - 02/01/15 1230    Visit Number 8   Number of Visits 17   Date for PT Re-Evaluation 02/24/15   PT Start Time 1230   PT Stop Time 1315   PT Time Calculation (min) 45 min   Equipment Utilized During Treatment Gait belt   Activity Tolerance Patient tolerated treatment well   Behavior During Therapy [facility] for tasks assessed/performed      Past Medical History  Diagnosis Date  . Hyperlipidemia   . Hypothyroidism   . Arthritis   . Anemia   . Asthma   . Anxiety   . IBS (irritable bowel syndrome)   . Gout   . Colon polyps   . Tubulovillous adenoma 4/07  . Hemorrhoids   . Diverticulosis   . Chronic diarrhea   . Chronic kidney disease, stage IV (severe)   . Hypertensive heart disease     no significant RAS by MRA 2002- (<30% LRAS)   . Paroxysmal atrial fibrillation 11/01/2013    Intermittent through the years and recurrent in December of 2014 treated with amiodarone;; Negative Myoview 10/2013  . Peripheral vascular disease     34-74% LICA, 2-59% RICA by doppler 2009   . Benign positional vertigo   . LBBB (left bundle branch block)- new 11/01/13 11/01/2013  . Diastolic dysfunction, left ventricle 11/03/2013    Past Surgical History  Procedure Laterality Date  . Lumbar disc surgery  2000  . Squamous cell skin cancer removed    . Oophorectomy  1962    right  . Appendectomy    . Low anterior bowel resection  7/08  . Cataract extraction    . Transthoracic echocardiogram  11/03/2013    Echocardiogram: EF 55-60%, mild LVH, elevated bili pressures. Mild aortic valve calcification.  Marland Kitchen Nm myoview ltd  11/02/2013    Negative  for ischemia or infarction. EF 60%.    There were no vitals filed for this visit.  Visit Diagnosis:  Abnormality of gait  Balance problems  Falls, initial encounter      Subjective Assessment - 02/01/15 1231    Symptoms No new issues. Shoes help but hurt her feet / hot after a few hours. Patient reports she is better but has fear of going out still.   Currently in Pain? No/denies      Self Care: Reviewed fall prevention strategies with patient verbalizing understanding. Patient verbalized understanding of ongoing fitness plan.  Gait Training: Patient ambulated 600' without device modified independent. She continues to have muscle fatigue resulting in foot slap ~300' and foot drop over 500' in athletic shoes. With light weight shoes she like to wear, she has foot drop the entire time. Patient needs a Foot-Up orthosis to aide with clearance when going into community with locations >300'. PT also recommended she wear athletic shoes for community outings whenever possible.                         PT Education - 02/01/15 0757    Education provided Yes   Education Details continue with fitness plan including [employer] back up to 3 days per week, HEP and  slowly progressing activity level back up to prior to fall   Person(s) Educated Patient   Methods Explanation   Comprehension Verbalized understanding          PT Short Term Goals - 01/23/15 1702    PT SHORT TERM GOAL #1   Title demonstrates understanding of initial HEP (Target Date: 01/27/15)   Baseline MET 01/23/15   Time 4   Period Weeks   Status Achieved   PT SHORT TERM GOAL #2   Title Berg Balance >40/56 (Target Date: 01/27/15)   Baseline MET 01/23/15 Berg 43/56 (Initial was 31/56)   Time 4   Period Weeks   Status Achieved   PT SHORT TERM GOAL #3   Title Functional Gait Assessment >20/30 (Target Date: 01/27/15)   Baseline MET 01/23/15 FGA 22/30 (Initial was 15/30)   Time 4   Period Weeks   Status Achieved            PT Long Term Goals - February 02, 2015 1233    PT LONG TERM GOAL #1   Title Patient verbalizes understanding of ongoing fitness plan / progressive HEP (Target Date: 02/24/15)   Baseline 3/21- has returned to [place] #2   Title verbalizes fall prevention strategies within home (Target Date: 02/24/15)   Baseline 02-02-23 MET   Status Achieved   PT LONG TERM GOAL #3   Title Berg Balance >/=45/56 (Target Date: 02/24/15)   Baseline 3/21- met with score of 50/56   Status Achieved   PT LONG TERM GOAL #4   Title Functional Gait Assessment >/= 23/30  (Target Date: 02/24/15)   Baseline 3/21- met with score of 28/30   Status Achieved               Plan - Feb 02, 2015 1300    Clinical Impression Statement Patient met all LTGs. She continues to have muscle fatigue resulting in foot slap ~300' and foot drop over 500' in athletic shoes. With light weight shoes she like to wear, she has foot drop the entire time.   Pt will benefit from skilled therapeutic intervention in order to improve on the following deficits Abnormal gait;Decreased activity tolerance;Decreased balance;Decreased mobility;Decreased strength   Rehab Potential Good   PT Frequency 2x / week   PT Duration 8 weeks   PT Treatment/Interventions ADLs/Self Care Home Management;DME Instruction;Gait training;Stair training;Functional mobility training;Therapeutic activities;Therapeutic exercise;Balance training;Neuromuscular re-education;Patient/family education;Other (comment)  orthotic consult   PT Next Visit Plan discharge   Recommended Other Services PT recommended a Foot-Up Orthosis that is less expensive to purchase online.   Consulted and Agree with Plan of Care Patient          G-Codes - 02-Feb-2015 1230    Functional Assessment Tool Used Berg Balance 50/56   Functional Limitation Mobility: Walking and moving around   Mobility: Walking and Moving Around Goal Status [phone]) At least 20 percent  but less than 40 percent impaired, limited or restricted   Mobility: Walking and Moving Around Discharge Status [phone]) At least 20 percent but less than 40 percent impaired, limited or restricted      Problem List Patient Active Problem List   Diagnosis Date Noted  . Abnormal liver function tests 11/10/2014  . Systolic hypertension, isolated; difficult to control 10/04/2014  . CKD (chronic kidney disease), stage IV 07/31/2014  . Angina of effort 07/30/2014  . Tremor 07/26/2014  . Gout 06/17/2014  . Edema of both legs 02/09/2014  . On  amiodarone therapy 12/25/2013  . Encounter for long-term (current) use of other medications 12/25/2013  . Benign hypertension with chronic kidney disease, stage III 12/25/2013  . Chest pain 12/03/2013  . Chronic diastolic heart failure 63/94/3200  . H/O tachycardia-bradycardia syndrome   . Chronic anticoagulation- Eliquis 11/17/2013  . LBBB (left bundle branch block)- new 11/01/13 11/01/2013  . Paroxysmal atrial fibrillation 11/01/2013  . GERD (gastroesophageal reflux disease) 08/05/2013  . Peripheral vascular disease   . Benign positional vertigo   . Irritable bowel syndrome 03/22/2009  . Anxiety   . Reactive airway disease 01/13/2008  . Chronic anemia   . Osteoarthritis   . Hypothyroidism   . Hyperlipidemia   . Hypertensive heart disease     Laurelin Elson PT, DPT 02/02/2015, 10:10 AM  [facility] [address] [place], [place], [address] Phone: [phone]   Fax:  [phone]

## 2015-02-16 ENCOUNTER — Other Ambulatory Visit: Payer: Self-pay | Admitting: Gastroenterology

## 2015-02-16 ENCOUNTER — Telehealth: Payer: Self-pay | Admitting: Gastroenterology

## 2015-02-16 MED ORDER — HYOSCYAMINE SULFATE 0.125 MG PO TABS
ORAL_TABLET | ORAL | Status: DC
Start: 2015-02-16 — End: 2015-12-01

## 2015-02-16 NOTE — Telephone Encounter (Signed)
Prescription sent to patient's pharmacy until scheduled appt on 03/23/15.

## 2015-02-22 ENCOUNTER — Telehealth: Payer: Self-pay | Admitting: Family Medicine

## 2015-02-22 MED ORDER — METHYLPREDNISOLONE 4 MG PO KIT: PACK | ORAL | Status: DC

## 2015-02-22 NOTE — Telephone Encounter (Signed)
I sent script e-scribe and spoke with pt. 

## 2015-02-22 NOTE — Telephone Encounter (Signed)
Pt called to say that she is having a gout flare up and is asking if Dr Sarajane Jews will rx her some predisone     [facility]

## 2015-03-14 ENCOUNTER — Other Ambulatory Visit: Payer: Self-pay | Admitting: Nurse Practitioner

## 2015-03-19 ENCOUNTER — Other Ambulatory Visit: Payer: Self-pay | Admitting: Nurse Practitioner

## 2015-03-23 ENCOUNTER — Ambulatory Visit: Payer: Medicare Other | Admitting: Gastroenterology

## 2015-03-23 ENCOUNTER — Other Ambulatory Visit: Payer: Self-pay | Admitting: Cardiology

## 2015-03-23 ENCOUNTER — Other Ambulatory Visit: Payer: Self-pay | Admitting: *Deleted

## 2015-03-23 ENCOUNTER — Telehealth: Payer: Self-pay

## 2015-03-23 MED ORDER — FUROSEMIDE 40 MG PO TABS: 40.0000 mg | ORAL_TABLET | Freq: Every day | ORAL | Status: DC | PRN

## 2015-03-23 NOTE — Telephone Encounter (Signed)
-----   Message from Dow Adolph sent at 03/23/2015  8:05 AM EDT ----- Pt canceled for today bc she is sick

## 2015-03-23 NOTE — Telephone Encounter (Signed)
Yes charge 

## 2015-03-23 NOTE — Telephone Encounter (Signed)
Do you want to charge? 

## 2015-03-24 NOTE — Telephone Encounter (Signed)
Rx(s) sent to pharmacy electronically.  

## 2015-03-30 ENCOUNTER — Other Ambulatory Visit: Payer: Self-pay | Admitting: Family Medicine

## 2015-04-17 ENCOUNTER — Encounter: Payer: Self-pay | Admitting: Family Medicine

## 2015-04-17 ENCOUNTER — Ambulatory Visit (INDEPENDENT_AMBULATORY_CARE_PROVIDER_SITE_OTHER): Payer: Medicare Other | Admitting: Family Medicine

## 2015-04-17 ENCOUNTER — Telehealth: Payer: Self-pay | Admitting: Family Medicine

## 2015-04-17 VITALS — BP 209/85 | HR 58 | Temp 98.4°F | Wt 137.0 lb

## 2015-04-17 DIAGNOSIS — M1000 Idiopathic gout, unspecified site: Secondary | ICD-10-CM | POA: Diagnosis not present

## 2015-04-17 DIAGNOSIS — K219 Gastro-esophageal reflux disease without esophagitis: Secondary | ICD-10-CM

## 2015-04-17 MED ORDER — OMEPRAZOLE 40 MG PO CPDR: 40.0000 mg | DELAYED_RELEASE_CAPSULE | Freq: Every day | ORAL | Status: DC

## 2015-04-17 MED ORDER — PREDNISONE 10 MG PO TABS: ORAL_TABLET | ORAL | Status: DC

## 2015-04-17 NOTE — Progress Notes (Signed)
   Subjective:    Patient ID: [name], female    DOB: 03-24-1924, 81 y.o.   MRN: [id number]  HPI Here for 2 things. First she has had several weeks of constant heartburn and a sour taste on the mouth. OTC Zantac no longer helps. Also for the past 3 days she has had swelling and pain in the right foot. No recent trauma.    Review of Systems  Constitutional: Negative.   Cardiovascular: Negative.   Gastrointestinal: Negative.        Objective:   Physical Exam  Constitutional:  Limping, in pain   Cardiovascular: Normal rate, regular rhythm, normal heart sounds and intact distal pulses.   Pulmonary/Chest: Effort normal and breath sounds normal.  Abdominal: Soft. Bowel sounds are normal. She exhibits no distension. There is no tenderness. There is no rebound and no guarding.          Assessment & Plan:  She will start on Omeprazole 40 mg daily for the GERD. Treat the gout with a taper of prednisone.

## 2015-04-17 NOTE — Telephone Encounter (Signed)
Patient Name: [name] DOB: 10/29/1924 Initial Comment Caller states woke up in severe pain in foot, can't put much weight on it Nurse Assessment Nurse: Vallery Sa, RN, [place] Date/Time (Eastern Time): 04/17/2015 11:38:38 AM Confirm and document reason for call. If symptomatic, describe symptoms. ---Caller states she developed severe right foot pain this morning. No fever. No injury in the past 3 days. Has the patient traveled out of the country within the last 30 days? ---No Does the patient require triage? ---Yes Related visit to physician within the last 2 weeks? ---No Does the PT have any chronic conditions? (i.e. diabetes, asthma, etc.) ---Yes List chronic conditions. ---Gout, Heart problems Guidelines Guideline Title Affirmed Question Affirmed Notes Foot Pain [1] SEVERE pain (e.g., excruciating, unable to do any normal activities) AND [2] not improved after 2 hours of pain medicine Final Disposition User See Physician within 4 Hours (or PCP triage) Vallery Sa, RN, T[place] Comments Scheduled appointment with Dr Sarajane Jews today at 3:30pm

## 2015-04-17 NOTE — Progress Notes (Signed)
Pre visit review using our clinic review tool, if applicable. No additional management support is needed unless otherwise documented below in the visit note. 

## 2015-04-27 ENCOUNTER — Ambulatory Visit (INDEPENDENT_AMBULATORY_CARE_PROVIDER_SITE_OTHER): Payer: Medicare Other | Admitting: Cardiology

## 2015-04-27 ENCOUNTER — Encounter: Payer: Self-pay | Admitting: Cardiology

## 2015-04-27 VITALS — BP 132/54 | HR 60 | Ht 64.0 in | Wt 136.2 lb

## 2015-04-27 DIAGNOSIS — Z79899 Other long term (current) drug therapy: Secondary | ICD-10-CM

## 2015-04-27 DIAGNOSIS — I119 Hypertensive heart disease without heart failure: Secondary | ICD-10-CM

## 2015-04-27 DIAGNOSIS — I5032 Chronic diastolic (congestive) heart failure: Secondary | ICD-10-CM

## 2015-04-27 DIAGNOSIS — I480 Paroxysmal atrial fibrillation: Secondary | ICD-10-CM

## 2015-04-27 DIAGNOSIS — I447 Left bundle-branch block, unspecified: Secondary | ICD-10-CM | POA: Diagnosis not present

## 2015-04-27 DIAGNOSIS — I10 Essential (primary) hypertension: Secondary | ICD-10-CM

## 2015-04-27 MED ORDER — AMIODARONE HCL 200 MG PO TABS: 100.0000 mg | ORAL_TABLET | Freq: Every day | ORAL | Status: DC

## 2015-04-27 NOTE — Patient Instructions (Signed)
DECREASE DOSE OF AMIODARONE 100 MG (1/2 TABLET OF 200 MG) DAILY   USE FUROSEMIDE (LASIX) FOR BLOOD PRESSURE GREATER THAN 180  ( SBP) THAT IS THE TOP NUMBER.   Your physician recommends that you schedule a follow-up appointment in [place]  Your physician wants you to follow-up in [place]. You will receive a reminder letter in the mail two months in advance. If you don't receive a letter, please call our office to schedule the follow-up appointment.

## 2015-04-27 NOTE — Progress Notes (Signed)
The cath  PATIENT: [name] MRN: [id number]  DOB: 10/04/1924   DOV:04/28/2015 PCP: [name], MD  Clinic Note: Chief Complaint  Patient presents with  . Follow-up    85month followup;  chest pain-has some chest discomfort about 6 wks ago-not real pain, no shortness of breath, has edema-mostly in left leg, no pain in legs, no cramping in legs, none recently lightheadedness, no dizziness  . Atrial Fibrillation   HPI: [name] is a 81 y.o.  female with a PMH below who presents today for followup of atrial fibrillation with diastolic heart failure & difficult to control/labile HTN. I saw her in November. She is also seeing Tommy Medal, RPH-CCP on several occasions.  Interval History:  Today she presents really without any major cardiac symptoms. Her biggest concern is that she had a fall where she busted her face while at a restaurant. She has not had any cardiac symptoms and she's not had any problems with balance. No syncope/near syncope or TIA/amaurosis fugax. Despite her elevated blood pressure, she denies any headaches or blurred vision, dizziness or dyspnea. No chest tightness or pressure with rest or exertion. No PND, orthopnea or edema. She denies any rapid or irregular heartbeats to suggest recurrence of A. fib.  Past Medical History  Diagnosis Date  . Hyperlipidemia   . Hypothyroidism   . Arthritis   . Anemia   . Asthma   . Anxiety   . IBS (irritable bowel syndrome)   . Gout   . Colon polyps   . Tubulovillous adenoma 4/07  . Hemorrhoids   . Diverticulosis   . Chronic diarrhea   . Chronic kidney disease, stage IV (severe)   . Hypertensive heart disease     no significant RAS by MRA 2002- (<30% LRAS)   . Paroxysmal atrial fibrillation 11/01/2013    Intermittent through the years and recurrent in December of 2014 treated with amiodarone;; Negative Myoview 10/2013  . Peripheral vascular disease     123456 LICA, [id number] RICA by doppler 2009   . Benign  positional vertigo   . LBBB (left bundle branch block)- new 11/01/13 11/01/2013  . Diastolic dysfunction, left ventricle 11/03/2013  . Syncope and collapse 04/28/2015  . Symptomatic bradycardia 04/28/2015    Prior Cardiac Evaluation and Procedural History: Procedure Laterality Date  . Transthoracic echocardiogram  11/03/2013    Echocardiogram: EF 55-60%, mild LVH, elevated bili pressures. Mild aortic valve calcification.  Marland Kitchen Nm myoview ltd  11/02/2013    Negative for ischemia or infarction. EF 60%.    Allergies  Allergen Reactions  . Ambien [Zolpidem Tartrate]     " made her crazy "  . Amoxicillin     REACTION: unspecified  . Ceftin [Cefuroxime Axetil] Diarrhea  . Codeine Phosphate     REACTION: unspecified  . Colchicine     Severe diarrhea  . Lipitor [Atorvastatin] Other (See Comments)    "makes legs jump all night"  . Penicillins     REACTION: nausea, swelling  . Xifaxan [Rifaximin] Other (See Comments)    unknown   No current facility-administered medications on file prior to visit.   Current Outpatient Prescriptions on File Prior to Visit  Medication Sig Dispense Refill  . [facility] VITAMIN B12 1000 MCG tablet TAKE 1 TABLET BY MOUTH EVERY DAY 30 tablet 6  . ELIQUIS 2.5 MG TABS tablet TAKE 1 TABLET BY MOUTH TWICE A DAY 60 tablet 6  . furosemide (LASIX) 40 MG tablet Take 1 tablet (40 mg  total) by mouth daily as needed for fluid. 30 tablet 8  . hydrALAZINE (APRESOLINE) 100 MG tablet Take 1 tablet (100 mg total) by mouth 3 (three) times daily. 90 tablet 11  . hyoscyamine (LEVSIN, ANASPAZ) 0.125 MG tablet Take 1-2 tablets by mouth or under tongue every 4 hours as needed before meals 360 tablet 0  . isosorbide mononitrate (IMDUR) 60 MG 24 hr tablet TAKE 1 TABLET (60 MG TOTAL) BY MOUTH DAILY. 30 tablet 10  . levothyroxine (SYNTHROID, LEVOTHROID) 88 MCG tablet TAKE 1 TABLET (88 MCG TOTAL) BY MOUTH DAILY BEFORE BREAKFAST. 90 tablet 1  . LORazepam (ATIVAN) 0.5 MG tablet Take 1 tablet  (0.5 mg total) by mouth every 8 (eight) hours as needed for anxiety. (Patient taking differently: Take 0.5 mg by mouth daily as needed for anxiety. ) 90 tablet 5  . nitroGLYCERIN (NITROSTAT) 0.4 MG SL tablet Place 1 tablet (0.4 mg total) under the tongue every 5 (five) minutes x 3 doses as needed for chest pain. 25 tablet 3  . omeprazole (PRILOSEC) 40 MG capsule Take 1 capsule (40 mg total) by mouth daily. 30 capsule 11  . predniSONE (DELTASONE) 10 MG tablet Take 4 tabs a day for 4 days, then 3 a day for 4 days, then 2 a day for 4 days, then 1 a day for 4 days, then stop 60 tablet 0  . PROAIR HFA 108 (90 BASE) MCG/ACT inhaler Inhale 2 puffs into the lungs every 4 (four) hours as needed. Severe cough/wheeze  0  . temazepam (RESTORIL) 30 MG capsule Take 1 capsule (30 mg total) by mouth at bedtime as needed for sleep. 30 capsule 5    History   Social History Narrative   She is a widowed mother of 2, one child is deceased. Grandmother of one. She appears to be working out routinely at [employer] using [employer]. She is otherwise quite active.   She does not drink alcohol, does not smoke.  She is retired from [employer].    Lives alone in a one story condo.   ROS: A comprehensive Review of Systems - was performed Review of Systems  Constitutional: Negative for malaise/fatigue.  HENT: Negative for nosebleeds.   Eyes: Negative for blurred vision and double vision.  Respiratory: Negative for cough, shortness of breath and wheezing.   Cardiovascular: Negative for palpitations and claudication.  Gastrointestinal: Negative for blood in stool and melena.  Genitourinary: Negative for hematuria.  Musculoskeletal:       Has some MSK related CP  - no further falls  Neurological: Negative for dizziness, loss of consciousness and headaches.  Psychiatric/Behavioral: Positive for memory loss. Negative for depression.  All other systems reviewed and are negative.  Finally recovering  from her fall @ the restaurant.  PHYSICAL EXAM BP 132/54 mmHg  Pulse 60  Ht 5\' 4"  (1.626 m)  Wt 61.78 kg (136 lb 3.2 oz)  BMI 23.37 kg/m2 General appearance: alert, cooperative, appears stated age, no distress and Well-nourished and well-groomed. Neck: no adenopathy, soft bilateral carotid bruits, and no JVD Lungs: clear to auscultation bilaterally, normal percussion bilaterally and Nonlabored, good air movement Heart: Bradycardic with regular rhythm. Split S2 but no 1/6 SEM @ RUSB. No R/G.Marland Kitchen Nondisplaced PMI. Abdomen: soft, non-tender; bowel sounds normal; no masses,  no organomegaly Extremities: edema  1+ left greater than right, no ulcers, gangrene or trophic changes and varicose veins noted Pulses: 2+ and symmetric Neurologic: Grossly normal  EKG:  Normal sinus  rhythm, rate 60 bpm, LBBB  With repolarization changes. Otherwise normal.  ASSESSMENT / PLAN: Problem List Items Addressed This Visit    Chronic diastolic heart failure (Chronic)    No active symptoms. Not on ACE inhibitor or ARB secondary to renal insufficiency. Not on beta blocker to avoid further bradycardia with amiodarone and A. fib. Intolerant of calcium channel blockers.      Relevant Medications   amiodarone (PACERONE) 200 MG tablet   Other Relevant Orders   EKG 12-Lead (Completed)   Hypertensive heart disease - Primary (Chronic)    No signs of heart failure. When necessary Lasix. On hydralazine plus nitrate for afterload reduction/blood pressure control.      Relevant Medications   amiodarone (PACERONE) 200 MG tablet   Other Relevant Orders   EKG 12-Lead (Completed)   On amiodarone therapy (Chronic)   Relevant Orders   EKG 12-Lead (Completed)   Paroxysmal atrial fibrillation (Chronic)    Maintaining sinus rhythm. Reduce amiodarone to 100 mg daily. Anticoagulated on ELIQUIS  low-dose      Relevant Medications   amiodarone (PACERONE) 200 MG tablet   Other Relevant Orders   EKG 12-Lead (Completed)    Systolic hypertension, isolated; difficult to control (Chronic)    Blood pressure looks much better today. Quite labile  Continues on her when necessary Lasix as an additional when necessary for hypertension. For systolic blood pressure greater than 180 mmHg Multiple allergies/intolerances.      Relevant Medications   amiodarone (PACERONE) 200 MG tablet       Orders Placed This Encounter  Procedures  . EKG 12-Lead   Meds ordered this encounter  Medications  . amiodarone (PACERONE) 200 MG tablet    Sig: Take 0.5 tablets (100 mg total) by mouth daily.    Dispense:  30 tablet    Refill:  11   reducing amiodarone to 100 mg daily  Followup:   6 months follow-up with Kerin Ransom, PA-C then 6 months with me  DAVID W. Ellyn Hack, M.D., M.S. Interventional Cardiology [facility]

## 2015-04-28 ENCOUNTER — Encounter: Payer: Self-pay | Admitting: Cardiology

## 2015-04-28 ENCOUNTER — Emergency Department (HOSPITAL_COMMUNITY): Payer: Medicare Other

## 2015-04-28 ENCOUNTER — Encounter (HOSPITAL_COMMUNITY): Payer: Self-pay | Admitting: Emergency Medicine

## 2015-04-28 ENCOUNTER — Inpatient Hospital Stay (HOSPITAL_COMMUNITY)
Admission: EM | Admit: 2015-04-28 | Discharge: 2015-05-03 | DRG: 242 | Disposition: A | Discharge status: Skilled Nursing Facility | Payer: Medicare Other | Attending: Internal Medicine | Admitting: Internal Medicine

## 2015-04-28 DIAGNOSIS — I129 Hypertensive chronic kidney disease with stage 1 through stage 4 chronic kidney disease, or unspecified chronic kidney disease: Secondary | ICD-10-CM | POA: Diagnosis not present

## 2015-04-28 DIAGNOSIS — I10 Essential (primary) hypertension: Secondary | ICD-10-CM

## 2015-04-28 DIAGNOSIS — Z885 Allergy status to narcotic agent status: Secondary | ICD-10-CM

## 2015-04-28 DIAGNOSIS — Z7901 Long term (current) use of anticoagulants: Secondary | ICD-10-CM | POA: Diagnosis not present

## 2015-04-28 DIAGNOSIS — S7002XA Contusion of left hip, initial encounter: Secondary | ICD-10-CM | POA: Diagnosis present

## 2015-04-28 DIAGNOSIS — I1310 Hypertensive heart and chronic kidney disease without heart failure, with stage 1 through stage 4 chronic kidney disease, or unspecified chronic kidney disease: Secondary | ICD-10-CM | POA: Diagnosis not present

## 2015-04-28 DIAGNOSIS — S72002A Fracture of unspecified part of neck of left femur, initial encounter for closed fracture: Secondary | ICD-10-CM | POA: Diagnosis present

## 2015-04-28 DIAGNOSIS — S7290XA Unspecified fracture of unspecified femur, initial encounter for closed fracture: Secondary | ICD-10-CM | POA: Diagnosis present

## 2015-04-28 DIAGNOSIS — R55 Syncope and collapse: Secondary | ICD-10-CM | POA: Diagnosis present

## 2015-04-28 DIAGNOSIS — Z959 Presence of cardiac and vascular implant and graft, unspecified: Secondary | ICD-10-CM | POA: Diagnosis not present

## 2015-04-28 DIAGNOSIS — E039 Hypothyroidism, unspecified: Secondary | ICD-10-CM | POA: Diagnosis present

## 2015-04-28 DIAGNOSIS — I495 Sick sinus syndrome: Principal | ICD-10-CM | POA: Diagnosis present

## 2015-04-28 DIAGNOSIS — W19XXXA Unspecified fall, initial encounter: Secondary | ICD-10-CM

## 2015-04-28 DIAGNOSIS — Z09 Encounter for follow-up examination after completed treatment for conditions other than malignant neoplasm: Secondary | ICD-10-CM

## 2015-04-28 DIAGNOSIS — I5032 Chronic diastolic (congestive) heart failure: Secondary | ICD-10-CM | POA: Diagnosis present

## 2015-04-28 DIAGNOSIS — S0101XA Laceration without foreign body of scalp, initial encounter: Secondary | ICD-10-CM | POA: Diagnosis present

## 2015-04-28 DIAGNOSIS — Z79899 Other long term (current) drug therapy: Secondary | ICD-10-CM | POA: Diagnosis not present

## 2015-04-28 DIAGNOSIS — I119 Hypertensive heart disease without heart failure: Secondary | ICD-10-CM

## 2015-04-28 DIAGNOSIS — J45909 Unspecified asthma, uncomplicated: Secondary | ICD-10-CM | POA: Diagnosis present

## 2015-04-28 DIAGNOSIS — I130 Hypertensive heart and chronic kidney disease with heart failure and stage 1 through stage 4 chronic kidney disease, or unspecified chronic kidney disease: Secondary | ICD-10-CM | POA: Diagnosis present

## 2015-04-28 DIAGNOSIS — N184 Chronic kidney disease, stage 4 (severe): Secondary | ICD-10-CM | POA: Diagnosis present

## 2015-04-28 DIAGNOSIS — N183 Chronic kidney disease, stage 3 (moderate): Secondary | ICD-10-CM | POA: Diagnosis not present

## 2015-04-28 DIAGNOSIS — Z888 Allergy status to other drugs, medicaments and biological substances status: Secondary | ICD-10-CM | POA: Diagnosis not present

## 2015-04-28 DIAGNOSIS — Z8679 Personal history of other diseases of the circulatory system: Secondary | ICD-10-CM | POA: Diagnosis not present

## 2015-04-28 DIAGNOSIS — E785 Hyperlipidemia, unspecified: Secondary | ICD-10-CM | POA: Diagnosis present

## 2015-04-28 DIAGNOSIS — I441 Atrioventricular block, second degree: Secondary | ICD-10-CM | POA: Diagnosis present

## 2015-04-28 DIAGNOSIS — Z8249 Family history of ischemic heart disease and other diseases of the circulatory system: Secondary | ICD-10-CM | POA: Diagnosis not present

## 2015-04-28 DIAGNOSIS — K219 Gastro-esophageal reflux disease without esophagitis: Secondary | ICD-10-CM | POA: Diagnosis present

## 2015-04-28 DIAGNOSIS — I4510 Unspecified right bundle-branch block: Secondary | ICD-10-CM

## 2015-04-28 DIAGNOSIS — D62 Acute posthemorrhagic anemia: Secondary | ICD-10-CM | POA: Diagnosis present

## 2015-04-28 DIAGNOSIS — K580 Irritable bowel syndrome with diarrhea: Secondary | ICD-10-CM | POA: Diagnosis present

## 2015-04-28 DIAGNOSIS — W1839XA Other fall on same level, initial encounter: Secondary | ICD-10-CM | POA: Diagnosis present

## 2015-04-28 DIAGNOSIS — G834 Cauda equina syndrome: Secondary | ICD-10-CM | POA: Diagnosis present

## 2015-04-28 DIAGNOSIS — D638 Anemia in other chronic diseases classified elsewhere: Secondary | ICD-10-CM | POA: Diagnosis present

## 2015-04-28 DIAGNOSIS — I455 Other specified heart block: Secondary | ICD-10-CM

## 2015-04-28 DIAGNOSIS — Z880 Allergy status to penicillin: Secondary | ICD-10-CM | POA: Diagnosis not present

## 2015-04-28 DIAGNOSIS — I739 Peripheral vascular disease, unspecified: Secondary | ICD-10-CM | POA: Diagnosis present

## 2015-04-28 DIAGNOSIS — I442 Atrioventricular block, complete: Secondary | ICD-10-CM | POA: Diagnosis not present

## 2015-04-28 DIAGNOSIS — M109 Gout, unspecified: Secondary | ICD-10-CM | POA: Diagnosis present

## 2015-04-28 DIAGNOSIS — Z881 Allergy status to other antibiotic agents status: Secondary | ICD-10-CM | POA: Diagnosis not present

## 2015-04-28 DIAGNOSIS — I110 Hypertensive heart disease with heart failure: Secondary | ICD-10-CM | POA: Diagnosis not present

## 2015-04-28 DIAGNOSIS — R001 Bradycardia, unspecified: Secondary | ICD-10-CM

## 2015-04-28 DIAGNOSIS — I480 Paroxysmal atrial fibrillation: Secondary | ICD-10-CM | POA: Diagnosis present

## 2015-04-28 DIAGNOSIS — Z85828 Personal history of other malignant neoplasm of skin: Secondary | ICD-10-CM

## 2015-04-28 DIAGNOSIS — D6489 Other specified anemias: Secondary | ICD-10-CM | POA: Diagnosis not present

## 2015-04-28 DIAGNOSIS — I452 Bifascicular block: Secondary | ICD-10-CM | POA: Diagnosis present

## 2015-04-28 DIAGNOSIS — Z950 Presence of cardiac pacemaker: Secondary | ICD-10-CM | POA: Insufficient documentation

## 2015-04-28 DIAGNOSIS — I447 Left bundle-branch block, unspecified: Secondary | ICD-10-CM

## 2015-04-28 DIAGNOSIS — Z419 Encounter for procedure for purposes other than remedying health state, unspecified: Secondary | ICD-10-CM

## 2015-04-28 DIAGNOSIS — S7292XD Unspecified fracture of left femur, subsequent encounter for closed fracture with routine healing: Secondary | ICD-10-CM | POA: Diagnosis not present

## 2015-04-28 HISTORY — DX: Syncope and collapse: R55

## 2015-04-28 HISTORY — DX: Other specified heart block: I45.5

## 2015-04-28 LAB — BASIC METABOLIC PANEL
ANION GAP: 15 (ref 5–15)
BUN: 60 mg/dL — ABNORMAL HIGH (ref 6–20)
CALCIUM: 8.5 mg/dL — AB (ref 8.9–10.3)
CO2: 25 mmol/L (ref 22–32)
Chloride: 103 mmol/L (ref 101–111)
Creatinine, Ser: 2.37 mg/dL — ABNORMAL HIGH (ref 0.44–1.00)
GFR calc non Af Amer: 17 mL/min — ABNORMAL LOW (ref >60)
GFR, EST AFRICAN AMERICAN: 20 mL/min — AB (ref >60)
Glucose, Bld: 113 mg/dL — ABNORMAL HIGH (ref 65–99)
Potassium: 4.6 mmol/L (ref 3.5–5.1)
SODIUM: 143 mmol/L (ref 135–145)

## 2015-04-28 LAB — CBG MONITORING, ED: Glucose-Capillary: 110 mg/dL — ABNORMAL HIGH (ref 65–99)

## 2015-04-28 LAB — HEPATIC FUNCTION PANEL
ALBUMIN: 3.5 g/dL (ref 3.5–5.0)
ALT: 32 U/L (ref 14–54)
AST: 31 U/L (ref 15–41)
Alkaline Phosphatase: 69 U/L (ref 38–126)
Total Bilirubin: 0.8 mg/dL (ref 0.3–1.2)
Total Protein: 6.2 g/dL — ABNORMAL LOW (ref 6.5–8.1)

## 2015-04-28 LAB — PROTIME-INR
INR: 1.17 (ref 0.00–1.49)
Prothrombin Time: 15.1 seconds (ref 11.6–15.2)

## 2015-04-28 LAB — APTT: aPTT: 29 seconds (ref 24–37)

## 2015-04-28 LAB — CBC WITH DIFFERENTIAL/PLATELET
BASOS ABS: 0 10*3/uL (ref 0.0–0.1)
BASOS PCT: 0 % (ref 0–1)
EOS ABS: 0 10*3/uL (ref 0.0–0.7)
Eosinophils Relative: 0 % (ref 0–5)
HCT: 27.9 % — ABNORMAL LOW (ref 36.0–46.0)
HEMOGLOBIN: 9.1 g/dL — AB (ref 12.0–15.0)
Lymphocytes Relative: 20 % (ref 12–46)
Lymphs Abs: 1.2 10*3/uL (ref 0.7–4.0)
MCH: 28 pg (ref 26.0–34.0)
MCHC: 32.6 g/dL (ref 30.0–36.0)
MCV: 85.8 fL (ref 78.0–100.0)
MONOS PCT: 12 % (ref 3–12)
Monocytes Absolute: 0.7 10*3/uL (ref 0.1–1.0)
NEUTROS PCT: 68 % (ref 43–77)
Neutro Abs: 4 10*3/uL (ref 1.7–7.7)
Platelets: 199 10*3/uL (ref 150–400)
RBC: 3.25 MIL/uL — ABNORMAL LOW (ref 3.87–5.11)
RDW: 18 % — AB (ref 11.5–15.5)
WBC: 5.9 10*3/uL (ref 4.0–10.5)

## 2015-04-28 LAB — MAGNESIUM: Magnesium: 1.7 mg/dL (ref 1.7–2.4)

## 2015-04-28 LAB — MRSA PCR SCREENING: MRSA by PCR: NEGATIVE

## 2015-04-28 LAB — TSH: TSH: 1.548 u[IU]/mL (ref 0.350–4.500)

## 2015-04-28 LAB — PHOSPHORUS: PHOSPHORUS: 4.1 mg/dL (ref 2.5–4.6)

## 2015-04-28 LAB — TROPONIN I: Troponin I: 0.04 ng/mL — ABNORMAL HIGH (ref <0.031)

## 2015-04-28 MED ORDER — LORAZEPAM 0.5 MG PO TABS
0.5000 mg | ORAL_TABLET | Freq: Three times a day (TID) | ORAL | Status: DC | PRN
  Administered 2015-04-28 – 2015-05-02 (×5): 0.5 mg via ORAL
  Filled 2015-04-28 (×5): qty 1

## 2015-04-28 MED ORDER — HYOSCYAMINE SULFATE 0.125 MG PO TABS
0.1250 mg | ORAL_TABLET | Freq: Four times a day (QID) | ORAL | Status: DC | PRN
  Filled 2015-04-28: qty 2

## 2015-04-28 MED ORDER — TEMAZEPAM 15 MG PO CAPS: 30.0000 mg | ORAL_CAPSULE | Freq: Every evening | ORAL | Status: DC | PRN

## 2015-04-28 MED ORDER — FENTANYL CITRATE (PF) 100 MCG/2ML IJ SOLN
50.0000 ug | Freq: Once | INTRAMUSCULAR | Status: AC
  Administered 2015-04-28: 50 ug via INTRAVENOUS
  Filled 2015-04-28: qty 2

## 2015-04-28 MED ORDER — ONDANSETRON HCL 4 MG PO TABS: 4.0000 mg | ORAL_TABLET | Freq: Four times a day (QID) | ORAL | Status: DC | PRN

## 2015-04-28 MED ORDER — SODIUM CHLORIDE 0.9 % IJ SOLN
3.0000 mL | Freq: Two times a day (BID) | INTRAMUSCULAR | Status: DC
  Administered 2015-04-29 – 2015-04-30 (×3): 3 mL via INTRAVENOUS

## 2015-04-28 MED ORDER — ONDANSETRON HCL 4 MG/2ML IJ SOLN
4.0000 mg | Freq: Four times a day (QID) | INTRAMUSCULAR | Status: DC | PRN
  Administered 2015-05-01: 4 mg via INTRAVENOUS

## 2015-04-28 MED ORDER — SODIUM CHLORIDE 0.9 % IJ SOLN
3.0000 mL | Freq: Two times a day (BID) | INTRAMUSCULAR | Status: DC
  Administered 2015-04-28 – 2015-04-30 (×4): 3 mL via INTRAVENOUS

## 2015-04-28 MED ORDER — MORPHINE SULFATE 4 MG/ML IJ SOLN
6.0000 mg | Freq: Once | INTRAMUSCULAR | Status: AC
  Administered 2015-04-28: 6 mg via INTRAVENOUS
  Filled 2015-04-28: qty 2

## 2015-04-28 MED ORDER — LEVOTHYROXINE SODIUM 88 MCG PO TABS
88.0000 ug | ORAL_TABLET | Freq: Every day | ORAL | Status: DC
  Administered 2015-04-29 – 2015-05-03 (×5): 88 ug via ORAL
  Filled 2015-04-28 (×6): qty 1

## 2015-04-28 MED ORDER — PANTOPRAZOLE SODIUM 40 MG PO TBEC
40.0000 mg | DELAYED_RELEASE_TABLET | Freq: Every day | ORAL | Status: DC
  Administered 2015-04-29 – 2015-05-03 (×5): 40 mg via ORAL
  Filled 2015-04-28 (×5): qty 1

## 2015-04-28 MED ORDER — SODIUM CHLORIDE 0.9 % IV SOLN: 250.0000 mL | INTRAVENOUS | Status: DC | PRN

## 2015-04-28 MED ORDER — HYDRALAZINE HCL 20 MG/ML IJ SOLN
10.0000 mg | Freq: Three times a day (TID) | INTRAMUSCULAR | Status: DC | PRN
  Administered 2015-05-01: 10 mg via INTRAVENOUS
  Filled 2015-04-28: qty 1

## 2015-04-28 MED ORDER — OXYCODONE HCL 5 MG PO TABS
5.0000 mg | ORAL_TABLET | Freq: Four times a day (QID) | ORAL | Status: DC | PRN
  Administered 2015-04-29: 5 mg via ORAL
  Filled 2015-04-28: qty 1

## 2015-04-28 MED ORDER — HYDRALAZINE HCL 50 MG PO TABS
100.0000 mg | ORAL_TABLET | Freq: Three times a day (TID) | ORAL | Status: DC
  Administered 2015-04-28 – 2015-04-30 (×5): 100 mg via ORAL
  Filled 2015-04-28 (×8): qty 2

## 2015-04-28 MED ORDER — ACETAMINOPHEN 650 MG RE SUPP: 650.0000 mg | Freq: Four times a day (QID) | RECTAL | Status: DC | PRN

## 2015-04-28 MED ORDER — MORPHINE SULFATE 2 MG/ML IJ SOLN
1.0000 mg | INTRAMUSCULAR | Status: DC | PRN
  Administered 2015-04-28: 1 mg via INTRAVENOUS
  Filled 2015-04-28: qty 1

## 2015-04-28 MED ORDER — TETANUS-DIPHTH-ACELL PERTUSSIS 5-2.5-18.5 LF-MCG/0.5 IM SUSP
0.5000 mL | Freq: Once | INTRAMUSCULAR | Status: AC
  Administered 2015-04-28: 0.5 mL via INTRAMUSCULAR
  Filled 2015-04-28: qty 0.5

## 2015-04-28 MED ORDER — SODIUM CHLORIDE 0.9 % IJ SOLN: 3.0000 mL | INTRAMUSCULAR | Status: DC | PRN

## 2015-04-28 MED ORDER — ACETAMINOPHEN 325 MG PO TABS
650.0000 mg | ORAL_TABLET | Freq: Four times a day (QID) | ORAL | Status: DC | PRN
  Administered 2015-04-29 – 2015-04-30 (×4): 650 mg via ORAL
  Filled 2015-04-28 (×4): qty 2

## 2015-04-28 MED ORDER — HYDRALAZINE HCL 100 MG PO TABS: 100.0000 mg | ORAL_TABLET | Freq: Three times a day (TID) | ORAL | Status: DC

## 2015-04-28 MED ORDER — ISOSORBIDE MONONITRATE ER 60 MG PO TB24
60.0000 mg | ORAL_TABLET | Freq: Every day | ORAL | Status: DC
  Administered 2015-04-29 – 2015-05-03 (×5): 60 mg via ORAL
  Filled 2015-04-28 (×5): qty 1

## 2015-04-28 NOTE — Assessment & Plan Note (Signed)
No signs of heart failure. When necessary Lasix. On hydralazine plus nitrate for afterload reduction/blood pressure control.

## 2015-04-28 NOTE — ED Notes (Signed)
MD at bedside. 

## 2015-04-28 NOTE — Assessment & Plan Note (Addendum)
Blood pressure looks much better today. Quite labile  Continues on her when necessary Lasix as an additional when necessary for hypertension. For systolic blood pressure greater than 180 mmHg Multiple allergies/intolerances.

## 2015-04-28 NOTE — ED Notes (Signed)
Pt transported to CT with this RN on continuous telemetry monitoring.

## 2015-04-28 NOTE — ED Notes (Signed)
Pt HR improved to 54, repeat EKG captured and given to Dr. Dominic Pea

## 2015-04-28 NOTE — H&P (Signed)
[facility] History and Physical  [name] [id number] DOB: 06/02/1924 DOA: 04/28/2015  Referring physician: Dr. Darl Householder PCP: [name], MD   Chief Complaint: syncope and collapse.  HPI: [name] is a 81 y.o. female with PMH significant for hypertension, hypothyroidism, anemia, CKD stage 3-4; PAF (on eliquis), GERD and chronic diastolic heart failure; presented to ED after she experienced an episode of syncope/ patient was in usual health and went shopping, at the store she experienced and episode of syncope and collapse; there was no prodromic symptoms according to patient. No post ictal episodes after conscious regained. Patient didn't experienced tongue bite, urinary incontinence or fecal incontinence. Patient in route to ED experienced transient junctional rhythm with HR down into the 20'-30's and felt dizzy. In ED she had another episode of junctional rhythm with LBBB. As part of work up from fall CT head was ordered and demonstrated no intracranial abnormalities. Patient endorses pain with movement of left hip and x-ray demonstrated left hip fracture. Of Note, Patient denies CP, SOB, fever, chills, abd pain, dysuria, nausea, vomiting or any other complaints. Cardiology and orthopedic service consulted, [facility] called to admit patient for further evaluation and treatment.   Review of Systems:  Negative except as otherwise mentioned on HPI.  Past Medical History  Diagnosis Date  . Hyperlipidemia   . Hypothyroidism   . Arthritis   . Anemia   . Asthma   . Anxiety   . IBS (irritable bowel syndrome)   . Gout   . Colon polyps   . Tubulovillous adenoma 4/07  . Hemorrhoids   . Diverticulosis   . Chronic diarrhea   . Chronic kidney disease, stage IV (severe)   . Hypertensive heart disease     no significant RAS by MRA 2002- (<30% LRAS)   . Paroxysmal atrial fibrillation 11/01/2013    Intermittent through the years and recurrent in December of 2014 treated with  amiodarone;; Negative Myoview 10/2013  . Peripheral vascular disease     123456 LICA, [id number] RICA by doppler 2009   . Benign positional vertigo   . LBBB (left bundle branch block)- new 11/01/13 11/01/2013  . Diastolic dysfunction, left ventricle 11/03/2013  . Syncope and collapse 04/28/2015  . Symptomatic bradycardia 04/28/2015   Past Surgical History  Procedure Laterality Date  . Lumbar disc surgery  2000  . Squamous cell skin cancer removed    . Oophorectomy  1962    right  . Appendectomy    . Low anterior bowel resection  7/08  . Cataract extraction    . Transthoracic echocardiogram  11/03/2013    Echocardiogram: EF 55-60%, mild LVH, elevated bili pressures. Mild aortic valve calcification.  Marland Kitchen Nm myoview ltd  11/02/2013    Negative for ischemia or infarction. EF 60%.   Social History:  reports that she has never smoked. She has never used smokeless tobacco. She reports that she does not drink alcohol or use illicit drugs.  Allergies  Allergen Reactions  . Ambien [Zolpidem Tartrate]     " made her crazy "  . Amoxicillin     REACTION: unspecified  . Ceftin [Cefuroxime Axetil] Diarrhea  . Codeine Phosphate     REACTION: unspecified  . Colchicine     Severe diarrhea  . Lipitor [Atorvastatin] Other (See Comments)    "makes legs jump all night"  . Penicillins     REACTION: nausea, swelling  . Xifaxan [Rifaximin] Other (See Comments)    unknown    Family History  Problem Relation Age of Onset  . Cancer Mother 3  . Heart attack Father   . Heart disease Brother   . Stroke Maternal Grandmother   . Cancer Maternal Grandfather     Lung cancer  . Stroke Paternal Grandmother   . Cancer Daughter     Prior to Admission medications   Medication Sig Start Date End Date Taking? Authorizing Provider  amiodarone (PACERONE) 200 MG tablet Take 0.5 tablets (100 mg total) by mouth daily. 04/27/15  Yes [name], MD  [facility] VITAMIN B12 1000 MCG tablet TAKE 1 TABLET BY MOUTH EVERY  DAY 01/06/15  Yes [name], MD  ELIQUIS 2.5 MG TABS tablet TAKE 1 TABLET BY MOUTH TWICE A DAY 12/01/14  Yes [name], MD  furosemide (LASIX) 40 MG tablet Take 1 tablet (40 mg total) by mouth daily as needed for fluid. 03/23/15  Yes Luke K Kilroy, PA-C  hydrALAZINE (APRESOLINE) 100 MG tablet Take 1 tablet (100 mg total) by mouth 3 (three) times daily. 10/03/14  Yes [name], MD  hyoscyamine (LEVSIN, ANASPAZ) 0.125 MG tablet Take 1-2 tablets by mouth or under tongue every 4 hours as needed before meals 02/16/15  Yes Ladene Artist, MD  isosorbide mononitrate (IMDUR) 60 MG 24 hr tablet TAKE 1 TABLET (60 MG TOTAL) BY MOUTH DAILY. 03/14/15  Yes [name], MD  levothyroxine (SYNTHROID, LEVOTHROID) 88 MCG tablet TAKE 1 TABLET (88 MCG TOTAL) BY MOUTH DAILY BEFORE BREAKFAST. 03/30/15  Yes [name], MD  LORazepam (ATIVAN) 0.5 MG tablet Take 1 tablet (0.5 mg total) by mouth every 8 (eight) hours as needed for anxiety. Patient taking differently: Take 0.5 mg by mouth daily as needed for anxiety.  11/15/14  Yes [name], MD  nitroGLYCERIN (NITROSTAT) 0.4 MG SL tablet Place 1 tablet (0.4 mg total) under the tongue every 5 (five) minutes x 3 doses as needed for chest pain. 07/31/14  Yes Rogelia Mire, NP  predniSONE (DELTASONE) 10 MG tablet Take 4 tabs a day for 4 days, then 3 a day for 4 days, then 2 a day for 4 days, then 1 a day for 4 days, then stop 04/17/15  Yes [name], MD  PROAIR HFA 108 (90 BASE) MCG/ACT inhaler Inhale 2 puffs into the lungs every 4 (four) hours as needed. Severe cough/wheeze 10/18/14  Yes Historical Provider, MD  temazepam (RESTORIL) 30 MG capsule Take 1 capsule (30 mg total) by mouth at bedtime as needed for sleep. 11/15/14  Yes [name], MD  omeprazole (PRILOSEC) 40 MG capsule Take 1 capsule (40 mg total) by mouth daily. 04/17/15   [name], MD   Physical Exam: Filed Vitals:   04/28/15 1715 04/28/15 1800 04/28/15 1815 04/28/15 1827  BP: 196/62  199/62 195/66 192/63  Pulse: 57 56 63   Temp:      TempSrc:      Resp: 12 24 12 20   SpO2: 96% 96% 94% 96%    Wt Readings from Last 3 Encounters:  04/27/15 61.78 kg (136 lb 3.2 oz)  04/17/15 62.143 kg (137 lb)  01/10/15 61.19 kg (134 lb 14.4 oz)    General:  Appears calm and comfortable; complaining of pain in her left hip with movement only. Denies SOB or CP. Mild laceration on her scalp, posterior aspect Eyes: PERRL, normal lids, irises & conjunctiva, no icterus ENT: grossly normal hearing, lips & tongue, no erythema or exudates Neck: no LAD, masses  or thyromegaly, no JVD Cardiovascular: positive SM, no rubs or gallops; patient with sinus bradycardia (HR mid 50's) Respiratory: CTA bilaterally, no w/r/r. Normal respiratory effort. Abdomen: soft, nt, nd, positive BS Skin: no rash or induration; no petechiae Musculoskeletal: grossly normal tone BUE/BLE; limited rage of motion on her left leg Psychiatric: grossly normal mood and affect, speech fluent and appropriate Neurologic: grossly non-focal.          Labs on Admission:  Basic Metabolic Panel:  Recent Labs Lab 04/28/15 1542  NA 143  K 4.6  CL 103  CO2 25  GLUCOSE 113*  BUN 60*  CREATININE 2.37*  CALCIUM 8.5*   Liver Function Tests:  Recent Labs Lab 04/28/15 1542  AST 31  ALT 32  ALKPHOS 69  BILITOT 0.8  PROT 6.2*  ALBUMIN 3.5   CBC:  Recent Labs Lab 04/28/15 1542  WBC 5.9  NEUTROABS 4.0  HGB 9.1*  HCT 27.9*  MCV 85.8  PLT 199   Cardiac Enzymes:  Recent Labs Lab 04/28/15 1542  TROPONINI 0.04*    CBG:  Recent Labs Lab 04/28/15 1659  GLUCAP 110*    Radiological Exams on Admission: Ct Head Wo Contrast  04/28/2015   CLINICAL DATA:  Syncope, head laceration after fall. Positive loss consciousness.  EXAM: CT HEAD WITHOUT CONTRAST  CT CERVICAL SPINE WITHOUT CONTRAST  TECHNIQUE: Multidetector CT imaging of the head and cervical spine was performed following the standard protocol without  intravenous contrast. Multiplanar CT image reconstructions of the cervical spine were also generated.  COMPARISON:  CT scan of November 19, 2014.  FINDINGS: CT HEAD FINDINGS  Bony calvarium appears intact. Mild diffuse cortical atrophy is noted. No mass effect or midline shift is noted. Ventricular size is within normal limits. There is no evidence of mass lesion, hemorrhage or acute infarction.  CT CERVICAL SPINE FINDINGS  No fracture or spondylolisthesis is noted. Severe degenerative disc disease is noted at C4-5, C5-6 and C6-7 with anterior osteophyte formation. Posterior facet joints appear intact. Visualized lung apices appear normal.  IMPRESSION: Mild diffuse cortical atrophy. No acute intracranial abnormality seen.  Severe multilevel degenerative disc disease seen in cervical spine. No fracture or spondylolisthesis is noted.   Electronically Signed   By: Marijo Conception, M.D.   On: 04/28/2015 16:39   Ct Cervical Spine Wo Contrast  04/28/2015   CLINICAL DATA:  Syncope, head laceration after fall. Positive loss consciousness.  EXAM: CT HEAD WITHOUT CONTRAST  CT CERVICAL SPINE WITHOUT CONTRAST  TECHNIQUE: Multidetector CT imaging of the head and cervical spine was performed following the standard protocol without intravenous contrast. Multiplanar CT image reconstructions of the cervical spine were also generated.  COMPARISON:  CT scan of November 19, 2014.  FINDINGS: CT HEAD FINDINGS  Bony calvarium appears intact. Mild diffuse cortical atrophy is noted. No mass effect or midline shift is noted. Ventricular size is within normal limits. There is no evidence of mass lesion, hemorrhage or acute infarction.  CT CERVICAL SPINE FINDINGS  No fracture or spondylolisthesis is noted. Severe degenerative disc disease is noted at C4-5, C5-6 and C6-7 with anterior osteophyte formation. Posterior facet joints appear intact. Visualized lung apices appear normal.  IMPRESSION: Mild diffuse cortical atrophy. No acute intracranial  abnormality seen.  Severe multilevel degenerative disc disease seen in cervical spine. No fracture or spondylolisthesis is noted.   Electronically Signed   By: Marijo Conception, M.D.   On: 04/28/2015 16:39   Dg Pelvis Portable  04/28/2015  CLINICAL DATA:  Recent syncopal episode with fall, pelvic pain  EXAM: PORTABLE PELVIS 1-2 VIEWS  COMPARISON:  02/26/2013  FINDINGS: The pelvic ring appears intact. Angulation along the superior aspect of the femoral head is noted with increased density along the femoral neck at the junction with the femoral head. This is highly suspicious for a mildly impacted fracture. Dedicated hip films or limited CT of the pelvis may be helpful. Degenerative changes of the lumbar spine are noted.  IMPRESSION: Changes in the proximal left femur as described suggestive of mildly impacted fracture.   Electronically Signed   By: Inez Catalina M.D.   On: 04/28/2015 16:37   Dg Chest Port 1 View  04/28/2015   CLINICAL DATA:  Fall, syncope, bradycardia  EXAM: PORTABLE CHEST - 1 VIEW  COMPARISON:  07/29/2014  FINDINGS: Pacing pads overlie the chest. Implanted monitor device projects over the left hemithorax. Moderate enlargement of the cardiomediastinal silhouette is noted without evidence for edema. No displaced rib fracture allowing for technique. No focal pulmonary opacity. No supine evidence for pneumothorax. No pleural effusion. Presumed skin fold artifact simulating pneumothorax at the right lung base on repeat image 2.  IMPRESSION: Cardiomegaly without focal acute finding.   Electronically Signed   By: Conchita Paris M.D.   On: 04/28/2015 16:40   Dg Femur Min 2 Views Left  04/28/2015   CLINICAL DATA:  Closed left hip fracture, initial encounter.  EXAM: LEFT FEMUR 2 VIEWS  COMPARISON:  None.  FINDINGS: Findings consistent with mildly displaced proximal left femoral neck fracture. This appears closed and posttraumatic. Remaining portion of femur appears normal.  IMPRESSION: Probable  mildly displaced proximal left femoral neck fracture.   Electronically Signed   By: Marijo Conception, M.D.   On: 04/28/2015 18:12    EKG:  First EKG with junctional rhythm and LBBB; second one with RBBB and sinus bradycardia   Assessment/Plan 1-Syncope and collapse: appears to be cardiogenic in nature due to tachy-brady syndrome -will hold b-blockers and amiodarone -patient will be admitted to stepdown with external pacers -cardiology on board and planning for pacemaker -eliquis/anticoagulation on hold currently given hematoma and needs for surgical intervention -will follow and replete as needed electrolytes  2-Hypothyroidism: will check TSH -continue synthroid  3-Hypertensive heart disease: uncontrolled -pain most likely contributing -will use hydralazine -continue imdur -if needed will add clonidine -no b-blockers or CCB due to #1  4-left hip fracture -high risk for surgery, but benefits outweigh risks -first will need to have pacemaker (most likely temporary) in place and be clear by cardiology -PRN analgesics and bed rest currently -ortho is on board -will hold eliquis and heparin products  5-GERD (gastroesophageal reflux disease): continue PPI  6-anemia of chronic disease: slightly worse, most likely from hematoma on left hip after fall -will hold eliquis and heparin products for now -will monitor Hgb trend  7-chronic kidney disease, stage III: Cr at baseline -will monitor and follow renal function trend  8-chronic diastolic heart failure: compensated -will follow daily weights -strict intake and output -heart healthy diet  9-IBS: will continue PRN levsin   Cardiology (Dr. Irish Lack) Orthopedic service (Dr. Doran Durand)  Code Status: Full DVT Prophylaxis:SCD's; patient with left hip hematoma and on eliquis (holding it for surgical intervention) Family Communication: daughter in law and son at bedside Disposition Plan: inpatient, stepdown; LOS > 2 midnights  Time  spent: 68 minutes  [place], [facility] Pager [phone]

## 2015-04-28 NOTE — Assessment & Plan Note (Signed)
No active symptoms. Not on ACE inhibitor or ARB secondary to renal insufficiency. Not on beta blocker to avoid further bradycardia with amiodarone and A. fib. Intolerant of calcium channel blockers.

## 2015-04-28 NOTE — ED Provider Notes (Signed)
CSN: [id number]     Arrival date & time 04/28/15  1523 History   First MD Initiated Contact with Patient 04/28/15 1527     Chief Complaint  Patient presents with  . Bradycardia  . Loss of Consciousness     (Consider location/radiation/quality/duration/timing/severity/associated sxs/prior Treatment) Patient is a 81 y.o. female presenting with syncope. The history is provided by the patient and the EMS personnel.  Loss of Consciousness Episode history:  Single Most recent episode:  Today Duration:  15 seconds Timing:  Sporadic Progression:  Resolved Chronicity:  New Context: normal activity (walking in [employer])   Witnessed: yes   Relieved by:  Nothing Worsened by:  Nothing tried Ineffective treatments:  None tried Associated symptoms: no chest pain, no fever, no focal sensory loss and no focal weakness     Past Medical History  Diagnosis Date  . Hyperlipidemia   . Hypothyroidism   . Arthritis   . Anemia   . Asthma   . Anxiety   . IBS (irritable bowel syndrome)   . Gout   . Colon polyps   . Tubulovillous adenoma 4/07  . Hemorrhoids   . Diverticulosis   . Chronic diarrhea   . Chronic kidney disease, stage IV (severe)   . Hypertensive heart disease     no significant RAS by MRA 2002- (<30% LRAS)   . Paroxysmal atrial fibrillation 11/01/2013    Intermittent through the years and recurrent in December of 2014 treated with amiodarone;; Negative Myoview 10/2013  . Peripheral vascular disease     123456 LICA, [id number] RICA by doppler 2009   . Benign positional vertigo   . LBBB (left bundle branch block)- new 11/01/13 11/01/2013  . Diastolic dysfunction, left ventricle 11/03/2013  . Syncope and collapse 04/28/2015  . Symptomatic bradycardia 04/28/2015   Past Surgical History  Procedure Laterality Date  . Lumbar disc surgery  2000  . Squamous cell skin cancer removed    . Oophorectomy  1962    right  . Appendectomy    . Low anterior bowel resection  7/08  . Cataract  extraction    . Transthoracic echocardiogram  11/03/2013    Echocardiogram: EF 55-60%, mild LVH, elevated bili pressures. Mild aortic valve calcification.  Marland Kitchen [facility] myoview ltd  11/02/2013    Negative for ischemia or infarction. EF 60%.   Family History  Problem Relation Age of Onset  . Cancer Mother 40  . Heart attack Father   . Heart disease Brother   . Stroke Maternal Grandmother   . Cancer Maternal Grandfather     Lung cancer  . Stroke Paternal Grandmother   . Cancer Daughter    History  Substance Use Topics  . Smoking status: Never Smoker   . Smokeless tobacco: Never Used     Comment: never used tobacco  . Alcohol Use: No   OB History    No data available     Review of Systems  Constitutional: Negative for fever.  Cardiovascular: Positive for syncope. Negative for chest pain.  Neurological: Negative for focal weakness.  All other systems reviewed and are negative.     Allergies  Ambien; Amoxicillin; Ceftin; Codeine phosphate; Colchicine; Lipitor; Penicillins; and Xifaxan  Home Medications   Prior to Admission medications   Medication Sig Start Date End Date Taking? Authorizing Provider  amiodarone (PACERONE) 200 MG tablet Take 0.5 tablets (100 mg total) by mouth daily. 04/27/15  Yes [name], MD  [facility] VITAMIN B12 1000 MCG tablet TAKE  1 TABLET BY MOUTH EVERY DAY 01/06/15  Yes [name], MD  ELIQUIS 2.5 MG TABS tablet TAKE 1 TABLET BY MOUTH TWICE A DAY 12/01/14  Yes [name], MD  furosemide (LASIX) 40 MG tablet Take 1 tablet (40 mg total) by mouth daily as needed for fluid. 03/23/15  Yes Luke K Kilroy, PA-C  hydrALAZINE (APRESOLINE) 100 MG tablet Take 1 tablet (100 mg total) by mouth 3 (three) times daily. 10/03/14  Yes [name], MD  hyoscyamine (LEVSIN, ANASPAZ) 0.125 MG tablet Take 1-2 tablets by mouth or under tongue every 4 hours as needed before meals 02/16/15  Yes Ladene Artist, MD  isosorbide mononitrate (IMDUR) 60 MG 24 hr tablet TAKE 1  TABLET (60 MG TOTAL) BY MOUTH DAILY. 03/14/15  Yes [name], MD  levothyroxine (SYNTHROID, LEVOTHROID) 88 MCG tablet TAKE 1 TABLET (88 MCG TOTAL) BY MOUTH DAILY BEFORE BREAKFAST. 03/30/15  Yes [name], MD  LORazepam (ATIVAN) 0.5 MG tablet Take 1 tablet (0.5 mg total) by mouth every 8 (eight) hours as needed for anxiety. Patient taking differently: Take 0.5 mg by mouth daily as needed for anxiety.  11/15/14  Yes [name], MD  nitroGLYCERIN (NITROSTAT) 0.4 MG SL tablet Place 1 tablet (0.4 mg total) under the tongue every 5 (five) minutes x 3 doses as needed for chest pain. 07/31/14  Yes Rogelia Mire, NP  predniSONE (DELTASONE) 10 MG tablet Take 4 tabs a day for 4 days, then 3 a day for 4 days, then 2 a day for 4 days, then 1 a day for 4 days, then stop 04/17/15  Yes [name], MD  PROAIR HFA 108 (90 BASE) MCG/ACT inhaler Inhale 2 puffs into the lungs every 4 (four) hours as needed. Severe cough/wheeze 10/18/14  Yes Historical Provider, MD  temazepam (RESTORIL) 30 MG capsule Take 1 capsule (30 mg total) by mouth at bedtime as needed for sleep. 11/15/14  Yes [name], MD  omeprazole (PRILOSEC) 40 MG capsule Take 1 capsule (40 mg total) by mouth daily. 04/17/15   [name], MD   BP 200/57 mmHg  Pulse 62  Temp(Src) 99.2 F (37.3 C) (Oral)  Resp 18  SpO2 97% Physical Exam  Constitutional: She is oriented to person, place, and time. She appears well-developed and well-nourished. No distress.  HENT:  Head: Normocephalic.  Eyes: Conjunctivae are normal.  Neck: Neck supple. No tracheal deviation present.  Cardiovascular: Regular rhythm and normal heart sounds.  Bradycardia present.   Pulmonary/Chest: Effort normal and breath sounds normal. No respiratory distress.  Abdominal: Soft. She exhibits no distension.  Musculoskeletal:       Left hip: She exhibits tenderness (with swelling and bruising over the left hip laterally).  Neurological: She is alert and oriented to person,  place, and time.  Skin: Skin is warm and dry.  Psychiatric: She has a normal mood and affect.    ED Course  Procedures (including critical care time) Labs Review Labs Reviewed  BASIC METABOLIC PANEL - Abnormal; Notable for the following:    Glucose, Bld 113 (*)    BUN 60 (*)    Creatinine, Ser 2.37 (*)    Calcium 8.5 (*)    GFR calc non Af Amer 17 (*)    GFR calc Af Amer 20 (*)    All other components within normal limits  CBC WITH DIFFERENTIAL/PLATELET - Abnormal; Notable for the following:    RBC 3.25 (*)  Hemoglobin 9.1 (*)    HCT 27.9 (*)    RDW 18.0 (*)    All other components within normal limits  TROPONIN I - Abnormal; Notable for the following:    Troponin I 0.04 (*)    All other components within normal limits  HEPATIC FUNCTION PANEL - Abnormal; Notable for the following:    Total Protein 6.2 (*)    Bilirubin, Direct <0.1 (*)    All other components within normal limits  CBG MONITORING, ED - Abnormal; Notable for the following:    Glucose-Capillary 110 (*)    All other components within normal limits  MRSA PCR SCREENING  MAGNESIUM  PHOSPHORUS  TSH  APTT  PROTIME-INR    Imaging Review Ct Head Wo Contrast  04/28/2015   CLINICAL DATA:  Syncope, head laceration after fall. Positive loss consciousness.  EXAM: CT HEAD WITHOUT CONTRAST  CT CERVICAL SPINE WITHOUT CONTRAST  TECHNIQUE: Multidetector CT imaging of the head and cervical spine was performed following the standard protocol without intravenous contrast. Multiplanar CT image reconstructions of the cervical spine were also generated.  COMPARISON:  CT scan of November 19, 2014.  FINDINGS: CT HEAD FINDINGS  Bony calvarium appears intact. Mild diffuse cortical atrophy is noted. No mass effect or midline shift is noted. Ventricular size is within normal limits. There is no evidence of mass lesion, hemorrhage or acute infarction.  CT CERVICAL SPINE FINDINGS  No fracture or spondylolisthesis is noted. Severe degenerative  disc disease is noted at C4-5, C5-6 and C6-7 with anterior osteophyte formation. Posterior facet joints appear intact. Visualized lung apices appear normal.  IMPRESSION: Mild diffuse cortical atrophy. No acute intracranial abnormality seen.  Severe multilevel degenerative disc disease seen in cervical spine. No fracture or spondylolisthesis is noted.   Electronically Signed   By: Marijo Conception, M.D.   On: 04/28/2015 16:39   Ct Cervical Spine Wo Contrast  04/28/2015   CLINICAL DATA:  Syncope, head laceration after fall. Positive loss consciousness.  EXAM: CT HEAD WITHOUT CONTRAST  CT CERVICAL SPINE WITHOUT CONTRAST  TECHNIQUE: Multidetector CT imaging of the head and cervical spine was performed following the standard protocol without intravenous contrast. Multiplanar CT image reconstructions of the cervical spine were also generated.  COMPARISON:  CT scan of November 19, 2014.  FINDINGS: CT HEAD FINDINGS  Bony calvarium appears intact. Mild diffuse cortical atrophy is noted. No mass effect or midline shift is noted. Ventricular size is within normal limits. There is no evidence of mass lesion, hemorrhage or acute infarction.  CT CERVICAL SPINE FINDINGS  No fracture or spondylolisthesis is noted. Severe degenerative disc disease is noted at C4-5, C5-6 and C6-7 with anterior osteophyte formation. Posterior facet joints appear intact. Visualized lung apices appear normal.  IMPRESSION: Mild diffuse cortical atrophy. No acute intracranial abnormality seen.  Severe multilevel degenerative disc disease seen in cervical spine. No fracture or spondylolisthesis is noted.   Electronically Signed   By: Marijo Conception, M.D.   On: 04/28/2015 16:39   Dg Pelvis Portable  04/28/2015   CLINICAL DATA:  Recent syncopal episode with fall, pelvic pain  EXAM: PORTABLE PELVIS 1-2 VIEWS  COMPARISON:  02/26/2013  FINDINGS: The pelvic ring appears intact. Angulation along the superior aspect of the femoral head is noted with increased  density along the femoral neck at the junction with the femoral head. This is highly suspicious for a mildly impacted fracture. Dedicated hip films or limited CT of the pelvis may be helpful. Degenerative  changes of the lumbar spine are noted.  IMPRESSION: Changes in the proximal left femur as described suggestive of mildly impacted fracture.   Electronically Signed   By: Inez Catalina M.D.   On: 04/28/2015 16:37   Dg Chest Port 1 View  04/28/2015   CLINICAL DATA:  Fall, syncope, bradycardia  EXAM: PORTABLE CHEST - 1 VIEW  COMPARISON:  07/29/2014  FINDINGS: Pacing pads overlie the chest. Implanted monitor device projects over the left hemithorax. Moderate enlargement of the cardiomediastinal silhouette is noted without evidence for edema. No displaced rib fracture allowing for technique. No focal pulmonary opacity. No supine evidence for pneumothorax. No pleural effusion. Presumed skin fold artifact simulating pneumothorax at the right lung base on repeat image 2.  IMPRESSION: Cardiomegaly without focal acute finding.   Electronically Signed   By: Conchita Paris M.D.   On: 04/28/2015 16:40   Dg Femur Min 2 Views Left  04/28/2015   CLINICAL DATA:  Closed left hip fracture, initial encounter.  EXAM: LEFT FEMUR 2 VIEWS  COMPARISON:  None.  FINDINGS: Findings consistent with mildly displaced proximal left femoral neck fracture. This appears closed and posttraumatic. Remaining portion of femur appears normal.  IMPRESSION: Probable mildly displaced proximal left femoral neck fracture.   Electronically Signed   By: Marijo Conception, M.D.   On: 04/28/2015 18:12     EKG Interpretation   Date/Time:  Friday April 28 2015 15:48:08 EDT Ventricular Rate:  54 PR Interval:  188 QRS Duration: 149 QT Interval:  532 QTC Calculation: 504 R Axis:   -10 Text Interpretation:  Sinus rhythm Left bundle branch block previous  tracing was junctional  Confirmed by YAO  MD, DAVID ([id number]) on 04/28/2015  4:34:13 PM      MDM    Final diagnoses:  Fall  Closed left hip fracture, initial encounter  Symptomatic bradycardia  Syncope, cardiogenic   81 year old female presents after a syncopal episode where she was walking through a [place] and suddenly felt lightheaded and awoke after 15-20 seconds to people standing around her. She had an ambulance called and on arrival they found her to be bradycardic. During transport she became bradycardic to rate of 20-30 and had further lightheadedness symptoms but did not have another syncopal episode.  Patient was placed on telemetry here in pads applied in case transvenous pacing was required. She appears to be in a junctional escape currently without P waves. While being observed she converted to a normal sinus rhythm with a rate of 54. She remained monitored on telemetry and screening labs are performed to look for an ischemic etiology. She has a persistent left bundle branch block but no ST or T-wave changes to suggest active myocardial ischemia.  Screening x-rays of injuries demonstrate a left hip fracture, orthopedics was consulted for evaluation and management and will have to wait until eliquis washes out. Cardiology is likely to put in a pacemaker on Monday after further observation. The hospitalist was consulted to put the patient on a stepdown unit and will evaluate the patient in the emergency department prior to admission.    Leo Grosser, MD 04/29/15 0040  Wandra Arthurs, MD 04/29/15 [phone]

## 2015-04-28 NOTE — H&P (Signed)
[name] is an 81 y.o. female.    Primary Cardiologist:Dr. Ellyn Hack   PCP:  [name], MD  Chief Complaint: passed out HPI: 81 year old female with hx of of atrial fibrillation with diastolic heart failure & difficult to control/labile HTN and hx LBBB, chronic CKD.   Her CHA2DS2VASc is 4 and she is on eliquis.  She was seen by Dr. Roni Bread yesterday and was doing well.  No complaints of a fib. Her amiodarone was decreased to 100 mg daily and lasix was to be taken prn.  Last echo 10/2013 with EF 55-60% mild LVH mild MR Lt atrium was severely dilated. PA pk pressure 47.  She was in a fib for this study. Last nuc 2014 neg for ischemia.   A few weeks ago she felt very tired after walking in from car - just increased fatigue but this had improved by the time she saw Dr. Roni Bread.  Today she felt well and went shopping, while in store she woke up in floor feeling dizzy.  She had no lightheadedness or dizziness prioe to the syncope.  She hit her head and has scalp laceration.  In ambulance her HR was low and she felt dizzy.  [facility] at [facility] HR was as low as 31 Junctional rhythm with RBBB.  Currently in SR in 60s she had external pacer in place- on standby.    Head CT of head and cervical spine stable.  No bleeding.  No fx.   though with hematoma lt hip and fracture lt femur.  No chest pain, no SOB. tropoinin 0.04  BP is elevated.   Past Medical History  Diagnosis Date  . Hyperlipidemia   . Hypothyroidism   . Arthritis   . Anemia   . Asthma   . Anxiety   . IBS (irritable bowel syndrome)   . Gout   . Colon polyps   . Tubulovillous adenoma 4/07  . Hemorrhoids   . Diverticulosis   . Chronic diarrhea   . Chronic kidney disease, stage IV (severe)   . Hypertensive heart disease     no significant RAS by MRA 2002- (<30% LRAS)   . Paroxysmal atrial fibrillation 11/01/2013    Intermittent through the years and recurrent in December of 2014 treated with amiodarone;; Negative  Myoview 10/2013  . Peripheral vascular disease     74-08% LICA, 1-44% RICA by doppler 2009   . Benign positional vertigo   . LBBB (left bundle branch block)- new 11/01/13 11/01/2013  . Diastolic dysfunction, left ventricle 11/03/2013    Past Surgical History  Procedure Laterality Date  . Lumbar disc surgery  2000  . Squamous cell skin cancer removed    . Oophorectomy  1962    right  . Appendectomy    . Low anterior bowel resection  7/08  . Cataract extraction    . Transthoracic echocardiogram  11/03/2013    Echocardiogram: EF 55-60%, mild LVH, elevated bili pressures. Mild aortic valve calcification.  Marland Kitchen Nm myoview ltd  11/02/2013    Negative for ischemia or infarction. EF 60%.    Family History  Problem Relation Age of Onset  . Cancer Mother 49  . Heart attack Father   . Heart disease Brother   . Stroke Maternal Grandmother   . Cancer Maternal Grandfather     Lung cancer  . Stroke Paternal Grandmother   . Cancer Daughter    Social History:  reports that  she has never smoked. She has never used smokeless tobacco. She reports that she does not drink alcohol or use illicit drugs.  Allergies:  Allergies  Allergen Reactions  . Ambien [Zolpidem Tartrate]     " made her crazy "  . Amoxicillin     REACTION: unspecified  . Ceftin [Cefuroxime Axetil] Diarrhea  . Codeine Phosphate     REACTION: unspecified  . Colchicine     Severe diarrhea  . Lipitor [Atorvastatin] Other (See Comments)    "makes legs jump all night"  . Penicillins     REACTION: nausea, swelling  . Xifaxan [Rifaximin] Other (See Comments)    unknown    OUTPATIENT MEDS: No current facility-administered medications on file prior to encounter.   Current Outpatient Prescriptions on File Prior to Encounter  Medication Sig Dispense Refill  . amiodarone (PACERONE) 200 MG tablet Take 0.5 tablets (100 mg total) by mouth daily. 30 tablet 11  . [employer] VITAMIN B12 1000 MCG tablet TAKE 1 TABLET BY MOUTH EVERY DAY 30  tablet 6  . ELIQUIS 2.5 MG TABS tablet TAKE 1 TABLET BY MOUTH TWICE A DAY 60 tablet 6  . furosemide (LASIX) 40 MG tablet Take 1 tablet (40 mg total) by mouth daily as needed for fluid. 30 tablet 8  . hydrALAZINE (APRESOLINE) 100 MG tablet Take 1 tablet (100 mg total) by mouth 3 (three) times daily. 90 tablet 11  . hyoscyamine (LEVSIN, ANASPAZ) 0.125 MG tablet Take 1-2 tablets by mouth or under tongue every 4 hours as needed before meals 360 tablet 0  . isosorbide mononitrate (IMDUR) 60 MG 24 hr tablet TAKE 1 TABLET (60 MG TOTAL) BY MOUTH DAILY. 30 tablet 10  . levothyroxine (SYNTHROID, LEVOTHROID) 88 MCG tablet TAKE 1 TABLET (88 MCG TOTAL) BY MOUTH DAILY BEFORE BREAKFAST. 90 tablet 1  . LORazepam (ATIVAN) 0.5 MG tablet Take 1 tablet (0.5 mg total) by mouth every 8 (eight) hours as needed for anxiety. (Patient taking differently: Take 0.5 mg by mouth daily. ) 90 tablet 5  . nitroGLYCERIN (NITROSTAT) 0.4 MG SL tablet Place 1 tablet (0.4 mg total) under the tongue every 5 (five) minutes x 3 doses as needed for chest pain. 25 tablet 3  . omeprazole (PRILOSEC) 40 MG capsule Take 1 capsule (40 mg total) by mouth daily. 30 capsule 11  . predniSONE (DELTASONE) 10 MG tablet Take 4 tabs a day for 4 days, then 3 a day for 4 days, then 2 a day for 4 days, then 1 a day for 4 days, then stop 60 tablet 0  . PROAIR HFA 108 (90 BASE) MCG/ACT inhaler Inhale 2 puffs into the lungs every 4 (four) hours as needed. Severe cough/wheeze  0  . temazepam (RESTORIL) 30 MG capsule Take 1 capsule (30 mg total) by mouth at bedtime as needed for sleep. 30 capsule 5     Results for orders placed or performed during the hospital encounter of 04/28/15 (from the past 48 hour(s))  Basic metabolic panel     Status: Abnormal   Collection Time: 04/28/15  3:42 PM  Result Value Ref Range   Sodium 143 135 - 145 mmol/L   Potassium 4.6 3.5 - 5.1 mmol/L   Chloride 103 101 - 111 mmol/L   CO2 25 22 - 32 mmol/L   Glucose, Bld 113 (H) 65 -  99 mg/dL   BUN 60 (H) 6 - 20 mg/dL   Creatinine, Ser 2.37 (H) 0.44 - 1.00 mg/dL   Calcium 8.5 (  L) 8.9 - 10.3 mg/dL   GFR calc non Af Amer 17 (L) >60 mL/min   GFR calc Af Amer 20 (L) >60 mL/min    Comment: (NOTE) The eGFR has been calculated using the CKD EPI equation. This calculation has not been validated in all clinical situations. eGFR's persistently <60 mL/min signify possible Chronic Kidney Disease.    Anion gap 15 5 - 15  CBC with Differential/Platelet     Status: Abnormal   Collection Time: 04/28/15  3:42 PM  Result Value Ref Range   WBC 5.9 4.0 - 10.5 K/uL   RBC 3.25 (L) 3.87 - 5.11 MIL/uL   Hemoglobin 9.1 (L) 12.0 - 15.0 g/dL   HCT 27.9 (L) 36.0 - 46.0 %   MCV 85.8 78.0 - 100.0 fL   MCH 28.0 26.0 - 34.0 pg   MCHC 32.6 30.0 - 36.0 g/dL   RDW 18.0 (H) 11.5 - 15.5 %   Platelets 199 150 - 400 K/uL   Neutrophils Relative % 68 43 - 77 %   Neutro Abs 4.0 1.7 - 7.7 K/uL   Lymphocytes Relative 20 12 - 46 %   Lymphs Abs 1.2 0.7 - 4.0 K/uL   Monocytes Relative 12 3 - 12 %   Monocytes Absolute 0.7 0.1 - 1.0 K/uL   Eosinophils Relative 0 0 - 5 %   Eosinophils Absolute 0.0 0.0 - 0.7 K/uL   Basophils Relative 0 0 - 1 %   Basophils Absolute 0.0 0.0 - 0.1 K/uL  Hepatic function panel     Status: Abnormal   Collection Time: 04/28/15  3:42 PM  Result Value Ref Range   Total Protein 6.2 (L) 6.5 - 8.1 g/dL   Albumin 3.5 3.5 - 5.0 g/dL   AST 31 15 - 41 U/L   ALT 32 14 - 54 U/L   Alkaline Phosphatase 69 38 - 126 U/L   Total Bilirubin 0.8 0.3 - 1.2 mg/dL   Bilirubin, Direct <0.1 (L) 0.1 - 0.5 mg/dL   Indirect Bilirubin NOT CALCULATED 0.3 - 0.9 mg/dL  CBG monitoring, ED     Status: Abnormal   Collection Time: 04/28/15  4:59 PM  Result Value Ref Range   Glucose-Capillary 110 (H) 65 - 99 mg/dL   Ct Head Wo Contrast  04/28/2015   CLINICAL DATA:  Syncope, head laceration after fall. Positive loss consciousness.  EXAM: CT HEAD WITHOUT CONTRAST  CT CERVICAL SPINE WITHOUT CONTRAST   TECHNIQUE: Multidetector CT imaging of the head and cervical spine was performed following the standard protocol without intravenous contrast. Multiplanar CT image reconstructions of the cervical spine were also generated.  COMPARISON:  CT scan of November 19, 2014.  FINDINGS: CT HEAD FINDINGS  Bony calvarium appears intact. Mild diffuse cortical atrophy is noted. No mass effect or midline shift is noted. Ventricular size is within normal limits. There is no evidence of mass lesion, hemorrhage or acute infarction.  CT CERVICAL SPINE FINDINGS  No fracture or spondylolisthesis is noted. Severe degenerative disc disease is noted at C4-5, C5-6 and C6-7 with anterior osteophyte formation. Posterior facet joints appear intact. Visualized lung apices appear normal.  IMPRESSION: Mild diffuse cortical atrophy. No acute intracranial abnormality seen.  Severe multilevel degenerative disc disease seen in cervical spine. No fracture or spondylolisthesis is noted.   Electronically Signed   By: Marijo Conception, M.D.   On: 04/28/2015 16:39   Ct Cervical Spine Wo Contrast  04/28/2015   CLINICAL DATA:  Syncope, head laceration after  fall. Positive loss consciousness.  EXAM: CT HEAD WITHOUT CONTRAST  CT CERVICAL SPINE WITHOUT CONTRAST  TECHNIQUE: Multidetector CT imaging of the head and cervical spine was performed following the standard protocol without intravenous contrast. Multiplanar CT image reconstructions of the cervical spine were also generated.  COMPARISON:  CT scan of November 19, 2014.  FINDINGS: CT HEAD FINDINGS  Bony calvarium appears intact. Mild diffuse cortical atrophy is noted. No mass effect or midline shift is noted. Ventricular size is within normal limits. There is no evidence of mass lesion, hemorrhage or acute infarction.  CT CERVICAL SPINE FINDINGS  No fracture or spondylolisthesis is noted. Severe degenerative disc disease is noted at C4-5, C5-6 and C6-7 with anterior osteophyte formation. Posterior facet  joints appear intact. Visualized lung apices appear normal.  IMPRESSION: Mild diffuse cortical atrophy. No acute intracranial abnormality seen.  Severe multilevel degenerative disc disease seen in cervical spine. No fracture or spondylolisthesis is noted.   Electronically Signed   By: Marijo Conception, M.D.   On: 04/28/2015 16:39   Dg Pelvis Portable  04/28/2015   CLINICAL DATA:  Recent syncopal episode with fall, pelvic pain  EXAM: PORTABLE PELVIS 1-2 VIEWS  COMPARISON:  02/26/2013  FINDINGS: The pelvic ring appears intact. Angulation along the superior aspect of the femoral head is noted with increased density along the femoral neck at the junction with the femoral head. This is highly suspicious for a mildly impacted fracture. Dedicated hip films or limited CT of the pelvis may be helpful. Degenerative changes of the lumbar spine are noted.  IMPRESSION: Changes in the proximal left femur as described suggestive of mildly impacted fracture.   Electronically Signed   By: Inez Catalina M.D.   On: 04/28/2015 16:37   Dg Chest Port 1 View  04/28/2015   CLINICAL DATA:  Fall, syncope, bradycardia  EXAM: PORTABLE CHEST - 1 VIEW  COMPARISON:  07/29/2014  FINDINGS: Pacing pads overlie the chest. Implanted monitor device projects over the left hemithorax. Moderate enlargement of the cardiomediastinal silhouette is noted without evidence for edema. No displaced rib fracture allowing for technique. No focal pulmonary opacity. No supine evidence for pneumothorax. No pleural effusion. Presumed skin fold artifact simulating pneumothorax at the right lung base on repeat image 2.  IMPRESSION: Cardiomegaly without focal acute finding.   Electronically Signed   By: Conchita Paris M.D.   On: 04/28/2015 16:40    ROS: General:no colds or fevers, no weight changes Skin:no rashes or ulcers HEENT:no blurred vision, no congestion  CV:see HPI PUL:see HPI GI:no diarrhea constipation or melena, no indigestion GU:no hematuria, no  dysuria MS:no joint pain prior to today, no claudication Neuro:+ syncope, + lightheadedness at times since syncope Endo:no diabetes, + thyroid disease   Blood pressure 184/58, pulse 54, temperature 97.7 F (36.5 C), temperature source Oral, resp. rate 14, SpO2 100 %. PE: General:Pleasant affect, NAD though anxious about condition Skin:Warm and dry, brisk capillary refill, scalp laceration post head HEENT:normocephalic, sclera clear, mucus membranes moist Neck:supple, no JVD, no bruits  Heart:S1S2 RRR with soft systolic murmur 1/6,   No gallup, rub or click Lungs:clear. Ant  without rales, rhonchi, or wheezes TML:YYTK, non tender, + BS, do not palpate liver spleen or masses Ext:no lower ext edema, 2+ pedal pulses, 2+ radial pulses, Lt hip with 3-4 cm hematoma + pain Neuro:alert and oriented X 3, MAE, follows commands, + facial symmetry    Assessment/Plan Principal Problem:   Syncope and collapse- most likely due to bradycardia/asystole  and possible LBBB & RBBB- external pacer in place on standby, IM to admit to stepdown prefer 2H stepdown.  Will stop amiodarone and eliquis. No anticoagulation for now- would use SCDs for now.  If H/H stable then may be able to use Hep. subcu..  Will need surgery on femur and will  need Temp. Pacer for surgery.  Dr. Lovena Le with EP to see tomorrow.  If recurrent bradycardia may need temp pacer this weekend.  With SR will hold anticoagulation for now. ? Cardiac cath to rule out ischemia as cause   Active Problems:   LBBB (left bundle branch block)- new 11/01/13   Symptomatic bradycardia   Right bundle branch block (RBBB) intermittent    Hypertensive heart disease no cath documenting CAD noted, monitor troponin, continue imdur   H/O tachycardia-bradycardia syndrome- hold amiodarone for now    Benign hypertension with chronic kidney disease, stage III- cr 2.37 today (previous cr 2.4 -2.6)  On apresoline    Systolic hypertension, isolated; difficult to control  no bb   Fracture, femur, lt- per ortho   Laceration of scalp-per IM   Anemia hx of mild anemia 11.3 in 10/2014 now 9.1, now acute blood loss with scalp wound and hip hematoma- monitor      [facility] R Nurse Practitioner Certified [place] Pager [phone] or after 5pm or weekends call [phone] 04/28/2015, 5:16 PM   I have examined the patient and reviewed assessment and plan and discussed with patient.  Agree with above as stated.  Complex situation given her anticoagulation. She will need surgery most likely for her hip. It will be a couple of days before the Eliquis washes out of her system. I suspect she may need a temporary pacemaker prior to surgery. Currently, her heart rate is stable. She does show evidence of alternating bundle branch blocks when we look at prior ECGs which I personally reviewed. Eventually, she'll need a permanent pacemaker.  It will be reasonable to plan for temporary pacemaker Monday morning with hip surgery to follow. If she has any rhythm disturbance before then, would have to consider doing the temporary pacemaker sooner.  Hypertension is significant issue is well. Avoid rate slowing agents. Can use IV hydralazine.  Chronic renal insufficiency is at baseline.  Will follow.  Kyonna Frier S.

## 2015-04-28 NOTE — Assessment & Plan Note (Addendum)
Maintaining sinus rhythm. Reduce amiodarone to 100 mg daily. Anticoagulated on ELIQUIS  low-dose

## 2015-04-28 NOTE — ED Notes (Signed)
Per EMS, pt was shopping and had a syncopal episode. The pt struck her head on a shopping rack and has a laceration to the posterior head. Pt did have a LOC and is on blood thinners. While en route ems reported that the pt said she felt funny and her HR dropped from 55 to the 20's. Upon arrival the pts HR was 30. Pt alert x4. Pt denies any symptoms. BP:200/70

## 2015-04-29 ENCOUNTER — Encounter (HOSPITAL_COMMUNITY): Payer: Self-pay | Admitting: *Deleted

## 2015-04-29 MED ORDER — AMLODIPINE BESYLATE 5 MG PO TABS
5.0000 mg | ORAL_TABLET | Freq: Every day | ORAL | Status: DC
  Administered 2015-04-29 – 2015-05-03 (×5): 5 mg via ORAL
  Filled 2015-04-29 (×6): qty 1

## 2015-04-29 MED ORDER — SODIUM CHLORIDE 0.9 % IV SOLN: INTRAVENOUS | Status: DC

## 2015-04-29 MED ORDER — SODIUM CHLORIDE 0.9 % IV SOLN
INTRAVENOUS | Status: DC
  Administered 2015-05-01: 06:00:00 via INTRAVENOUS

## 2015-04-29 MED ORDER — VANCOMYCIN HCL IN DEXTROSE 1-5 GM/200ML-% IV SOLN
1000.0000 mg | INTRAVENOUS | Status: DC
  Filled 2015-04-29: qty 200

## 2015-04-29 MED ORDER — CHLORHEXIDINE GLUCONATE 4 % EX LIQD
60.0000 mL | Freq: Once | CUTANEOUS | Status: AC
  Administered 2015-04-30: 4 via TOPICAL
  Filled 2015-04-29: qty 60

## 2015-04-29 MED ORDER — SODIUM CHLORIDE 0.9 % IR SOLN
80.0000 mg | Status: DC
  Filled 2015-04-29: qty 2

## 2015-04-29 MED ORDER — CHLORHEXIDINE GLUCONATE 4 % EX LIQD: 60.0000 mL | Freq: Once | CUTANEOUS | Status: DC

## 2015-04-29 MED ORDER — CHLORHEXIDINE GLUCONATE 4 % EX LIQD
60.0000 mL | Freq: Once | CUTANEOUS | Status: AC
  Administered 2015-05-01: 4 via TOPICAL
  Filled 2015-04-29: qty 60

## 2015-04-29 MED ORDER — VANCOMYCIN HCL IN DEXTROSE 1-5 GM/200ML-% IV SOLN
1000.0000 mg | INTRAVENOUS | Status: DC
  Filled 2015-04-29 (×2): qty 200

## 2015-04-29 MED ORDER — SODIUM CHLORIDE 0.9 % IR SOLN
80.0000 mg | Status: AC
  Administered 2015-05-01: 80 mg
  Filled 2015-04-29: qty 2

## 2015-04-29 MED ORDER — LOSARTAN POTASSIUM 50 MG PO TABS
50.0000 mg | ORAL_TABLET | Freq: Every day | ORAL | Status: DC
  Administered 2015-04-29: 50 mg via ORAL
  Filled 2015-04-29 (×2): qty 1

## 2015-04-29 MED ORDER — CEFAZOLIN SODIUM 1-5 GM-% IV SOLN: 1.0000 g | INTRAVENOUS | Status: DC

## 2015-04-29 NOTE — Progress Notes (Signed)
Pt complains of chest tightness 9/10 with no noted changed in vital signs; oxygen 2L [place] applied; EKG 12 lead completed; pt states pain had already resolved upon EKG completion; MD Jens Som aware; will continue to closely monitor

## 2015-04-29 NOTE — Progress Notes (Signed)
Patient ID: YUNIQUE Scott, female   DOB: 28-Jun-1924, 81 y.o.   MRN: [id number]    Patient Name: [name] Date of Encounter: 04/29/2015     Principal Problem:   Syncope and collapse Active Problems:   Hypothyroidism   Hypertensive heart disease   GERD (gastroesophageal reflux disease)   LBBB (left bundle branch block)- new 11/01/13   H/O tachycardia-bradycardia syndrome   Benign hypertension with chronic kidney disease, stage III   Systolic hypertension, isolated; difficult to control   Symptomatic bradycardia   Right bundle branch block (RBBB) intermittent    Fracture, femur, lt   Laceration of scalp    SUBJECTIVE  No hip pain. Note blood pressure elevated. Denies sob.  CURRENT MEDS . amLODipine  5 mg Oral Daily  . hydrALAZINE  100 mg Oral 3 times per day  . isosorbide mononitrate  60 mg Oral Daily  . levothyroxine  88 mcg Oral QAC breakfast  . pantoprazole  40 mg Oral Daily  . sodium chloride  3 mL Intravenous Q12H  . sodium chloride  3 mL Intravenous Q12H  . [START ON 05/01/2015] vancomycin  1,000 mg Intravenous To SS-Surg    OBJECTIVE  Filed Vitals:   04/29/15 0500 04/29/15 0600 04/29/15 0800 04/29/15 1224  BP: 156/42 165/47    Pulse: 56 51 58   Temp:   98.3 F (36.8 C) 97.9 F (36.6 C)  TempSrc:   Oral Oral  Resp: 19 14 17 12   Height:      Weight: 134 lb 14.7 oz (61.2 kg)     SpO2: 100% 100% 100% 100%    Intake/Output Summary (Last 24 hours) at 04/29/15 1241 Last data filed at 04/29/15 0930  Gross per 24 hour  Intake    340 ml  Output   1800 ml  Net  -1460 ml   Filed Weights   04/29/15 0042 04/29/15 0500  Weight: 134 lb 4.2 oz (60.9 kg) 134 lb 14.7 oz (61.2 kg)    PHYSICAL EXAM  General: Pleasant, elderly woman, NAD. Neuro: Alert and oriented X 3. Moves all extremities spontaneously. Psych: Normal affect. HEENT:  Normal  Neck: Supple without bruits or JVD. Lungs:  Resp regular and unlabored, CTA. Heart: RRR no s3, s4, or  murmurs. Abdomen: Soft, non-tender, non-distended, BS + x 4.  Extremities: No clubbing, cyanosis or edema. DP/PT/Radials 2+ and equal bilaterally.  Accessory Clinical Findings  CBC  Recent Labs  04/28/15 1542  WBC 5.9  NEUTROABS 4.0  HGB 9.1*  HCT 27.9*  MCV 85.8  PLT [id number]   Basic Metabolic Panel  Recent Labs  04/28/15 1542 04/28/15 2123  NA 143  --   K 4.6  --   CL 103  --   CO2 25  --   GLUCOSE 113*  --   BUN 60*  --   CREATININE 2.37*  --   CALCIUM 8.5*  --   MG  --  1.7  PHOS  --  4.1   Liver Function Tests  Recent Labs  04/28/15 1542  AST 31  ALT 32  ALKPHOS 69  BILITOT 0.8  PROT 6.2*  ALBUMIN 3.5   No results for input(s): LIPASE, AMYLASE in the last 72 hours. Cardiac Enzymes  Recent Labs  04/28/15 1542  TROPONINI 0.04*   BNP Invalid input(s): POCBNP D-Dimer No results for input(s): DDIMER in the last 72 hours. Hemoglobin A1C No results for input(s): HGBA1C in the last 72 hours. Fasting Lipid Panel No results  for input(s): CHOL, HDL, LDLCALC, TRIG, CHOLHDL, LDLDIRECT in the last 72 hours. Thyroid Function Tests  Recent Labs  04/28/15 2123  TSH 1.548    TELE  nsr   ECG - reviewed - nsr with lbbb and junctional rhythm with RBBB  Radiology/Studies  Ct Head Wo Contrast  04/28/2015   CLINICAL DATA:  Syncope, head laceration after fall. Positive loss consciousness.  EXAM: CT HEAD WITHOUT CONTRAST  CT CERVICAL SPINE WITHOUT CONTRAST  TECHNIQUE: Multidetector CT imaging of the head and cervical spine was performed following the standard protocol without intravenous contrast. Multiplanar CT image reconstructions of the cervical spine were also generated.  COMPARISON:  CT scan of November 19, 2014.  FINDINGS: CT HEAD FINDINGS  Bony calvarium appears intact. Mild diffuse cortical atrophy is noted. No mass effect or midline shift is noted. Ventricular size is within normal limits. There is no evidence of mass lesion, hemorrhage or acute  infarction.  CT CERVICAL SPINE FINDINGS  No fracture or spondylolisthesis is noted. Severe degenerative disc disease is noted at C4-5, C5-6 and C6-7 with anterior osteophyte formation. Posterior facet joints appear intact. Visualized lung apices appear normal.  IMPRESSION: Mild diffuse cortical atrophy. No acute intracranial abnormality seen.  Severe multilevel degenerative disc disease seen in cervical spine. No fracture or spondylolisthesis is noted.   Electronically Signed   By: Marijo Conception, M.D.   On: 04/28/2015 16:39   Ct Cervical Spine Wo Contrast  04/28/2015   CLINICAL DATA:  Syncope, head laceration after fall. Positive loss consciousness.  EXAM: CT HEAD WITHOUT CONTRAST  CT CERVICAL SPINE WITHOUT CONTRAST  TECHNIQUE: Multidetector CT imaging of the head and cervical spine was performed following the standard protocol without intravenous contrast. Multiplanar CT image reconstructions of the cervical spine were also generated.  COMPARISON:  CT scan of November 19, 2014.  FINDINGS: CT HEAD FINDINGS  Bony calvarium appears intact. Mild diffuse cortical atrophy is noted. No mass effect or midline shift is noted. Ventricular size is within normal limits. There is no evidence of mass lesion, hemorrhage or acute infarction.  CT CERVICAL SPINE FINDINGS  No fracture or spondylolisthesis is noted. Severe degenerative disc disease is noted at C4-5, C5-6 and C6-7 with anterior osteophyte formation. Posterior facet joints appear intact. Visualized lung apices appear normal.  IMPRESSION: Mild diffuse cortical atrophy. No acute intracranial abnormality seen.  Severe multilevel degenerative disc disease seen in cervical spine. No fracture or spondylolisthesis is noted.   Electronically Signed   By: Marijo Conception, M.D.   On: 04/28/2015 16:39   Dg Pelvis Portable  04/28/2015   CLINICAL DATA:  Recent syncopal episode with fall, pelvic pain  EXAM: PORTABLE PELVIS 1-2 VIEWS  COMPARISON:  02/26/2013  FINDINGS: The  pelvic ring appears intact. Angulation along the superior aspect of the femoral head is noted with increased density along the femoral neck at the junction with the femoral head. This is highly suspicious for a mildly impacted fracture. Dedicated hip films or limited CT of the pelvis may be helpful. Degenerative changes of the lumbar spine are noted.  IMPRESSION: Changes in the proximal left femur as described suggestive of mildly impacted fracture.   Electronically Signed   By: Inez Catalina M.D.   On: 04/28/2015 16:37   Dg Chest Port 1 View  04/28/2015   CLINICAL DATA:  Fall, syncope, bradycardia  EXAM: PORTABLE CHEST - 1 VIEW  COMPARISON:  07/29/2014  FINDINGS: Pacing pads overlie the chest. Implanted monitor device projects over  the left hemithorax. Moderate enlargement of the cardiomediastinal silhouette is noted without evidence for edema. No displaced rib fracture allowing for technique. No focal pulmonary opacity. No supine evidence for pneumothorax. No pleural effusion. Presumed skin fold artifact simulating pneumothorax at the right lung base on repeat image 2.  IMPRESSION: Cardiomegaly without focal acute finding.   Electronically Signed   By: Conchita Paris M.D.   On: 04/28/2015 16:40   Dg Femur Min 2 Views Left  04/28/2015   CLINICAL DATA:  Closed left hip fracture, initial encounter.  EXAM: LEFT FEMUR 2 VIEWS  COMPARISON:  None.  FINDINGS: Findings consistent with mildly displaced proximal left femoral neck fracture. This appears closed and posttraumatic. Remaining portion of femur appears normal.  IMPRESSION: Probable mildly displaced proximal left femoral neck fracture.   Electronically Signed   By: Marijo Conception, M.D.   On: 04/28/2015 18:12    ASSESSMENT AND PLAN  1. Syncope 2. Sinus node dysfunction 3. Severe HTN 4. Hip fracture Rec: she will need PPM prior to surgery. We will tentatively plan on Monday morning with Dr. Gerhard Munch Alexie Lanni,M.D.  04/29/2015 12:41 PM

## 2015-04-29 NOTE — Consult Note (Signed)
ELECTROPHYSIOLOGY CONSULT NOTE  Patient ID: [name], MRN: [id number], DOB/AGE: 1924/04/20 81 y.o. Admit date: 04/28/2015 Date of Consult: 04/29/2015  Primary Physician: [name], MD Primary Cardiologist: [facility]  Chief Complaint: syncope   HPI [name] is a 81 y.o. female admitted with syncope  Known old LBBB and admitted with sinus arrest and RBBB escape   Subsequently with 2:1 AVB  And othewise evidence of MBZ2 AVBlcok   Hx of PAF on amiodarone started 12/14   ECG of afiob prior to this presentation>>HR 106    Last echo 10/2013 with EF 55-60% mild LVH mild MR Lt atrium was severely dilated. PA pk pressure 47. She was in a fib for this study. Last nuc 2014 neg for ischemia.   6/16  ECG >>SR with LBBB 6/17  ECG >> Junctional vs Ventricular escape ( prob latter) with sinus arrest      Past Medical History  Diagnosis Date  . Hyperlipidemia   . Hypothyroidism   . Arthritis   . Anemia   . Asthma   . Anxiety   . IBS (irritable bowel syndrome)   . Gout   . Colon polyps   . Tubulovillous adenoma 4/07  . Hemorrhoids   . Diverticulosis   . Chronic diarrhea   . Chronic kidney disease, stage IV (severe)   . Hypertensive heart disease     no significant RAS by MRA 2002- (<30% LRAS)   . Paroxysmal atrial fibrillation 11/01/2013    Intermittent through the years and recurrent in December of 2014 treated with amiodarone;; Negative Myoview 10/2013  . Peripheral vascular disease     123456 LICA, [id number] RICA by doppler 2009   . Benign positional vertigo   . LBBB (left bundle branch block)- new 11/01/13 11/01/2013  . Diastolic dysfunction, left ventricle 11/03/2013  . Syncope and collapse 04/28/2015  . Symptomatic bradycardia 04/28/2015      Surgical History:  Past Surgical History  Procedure Laterality Date  . Lumbar disc surgery  2000  . Squamous cell skin cancer removed    . Oophorectomy  1962    right  . Appendectomy    . Low anterior bowel resection   7/08  . Cataract extraction    . Transthoracic echocardiogram  11/03/2013    Echocardiogram: EF 55-60%, mild LVH, elevated bili pressures. Mild aortic valve calcification.  Marland Kitchen Nm myoview ltd  11/02/2013    Negative for ischemia or infarction. EF 60%.     Home Meds: Prior to Admission medications   Medication Sig Start Date End Date Taking? Authorizing Provider  amiodarone (PACERONE) 200 MG tablet Take 0.5 tablets (100 mg total) by mouth daily. 04/27/15  Yes [name], MD  [facility] VITAMIN B12 1000 MCG tablet TAKE 1 TABLET BY MOUTH EVERY DAY 01/06/15  Yes [name], MD  ELIQUIS 2.5 MG TABS tablet TAKE 1 TABLET BY MOUTH TWICE A DAY 12/01/14  Yes [name], MD  furosemide (LASIX) 40 MG tablet Take 1 tablet (40 mg total) by mouth daily as needed for fluid. 03/23/15  Yes Luke K Kilroy, PA-C  hydrALAZINE (APRESOLINE) 100 MG tablet Take 1 tablet (100 mg total) by mouth 3 (three) times daily. 10/03/14  Yes [name], MD  hyoscyamine (LEVSIN, ANASPAZ) 0.125 MG tablet Take 1-2 tablets by mouth or under tongue every 4 hours as needed before meals 02/16/15  Yes Ladene Artist, MD  isosorbide mononitrate (IMDUR) 60 MG 24 hr tablet TAKE 1 TABLET (  60 MG TOTAL) BY MOUTH DAILY. 03/14/15  Yes [name], MD  levothyroxine (SYNTHROID, LEVOTHROID) 88 MCG tablet TAKE 1 TABLET (88 MCG TOTAL) BY MOUTH DAILY BEFORE BREAKFAST. 03/30/15  Yes [name], MD  LORazepam (ATIVAN) 0.5 MG tablet Take 1 tablet (0.5 mg total) by mouth every 8 (eight) hours as needed for anxiety. Patient taking differently: Take 0.5 mg by mouth daily as needed for anxiety.  11/15/14  Yes [name], MD  nitroGLYCERIN (NITROSTAT) 0.4 MG SL tablet Place 1 tablet (0.4 mg total) under the tongue every 5 (five) minutes x 3 doses as needed for chest pain. 07/31/14  Yes Rogelia Mire, NP  predniSONE (DELTASONE) 10 MG tablet Take 4 tabs a day for 4 days, then 3 a day for 4 days, then 2 a day for 4 days, then 1 a day for 4 days,  then stop 04/17/15  Yes [name], MD  PROAIR HFA 108 (90 BASE) MCG/ACT inhaler Inhale 2 puffs into the lungs every 4 (four) hours as needed. Severe cough/wheeze 10/18/14  Yes Historical Provider, MD  temazepam (RESTORIL) 30 MG capsule Take 1 capsule (30 mg total) by mouth at bedtime as needed for sleep. 11/15/14  Yes [name], MD  omeprazole (PRILOSEC) 40 MG capsule Take 1 capsule (40 mg total) by mouth daily. 04/17/15   [name], MD    Inpatient Medications:  . hydrALAZINE  100 mg Oral 3 times per day  . isosorbide mononitrate  60 mg Oral Daily  . levothyroxine  88 mcg Oral QAC breakfast  . pantoprazole  40 mg Oral Daily  . sodium chloride  3 mL Intravenous Q12H  . sodium chloride  3 mL Intravenous Q12H     Allergies:  Allergies  Allergen Reactions  . Ambien [Zolpidem Tartrate]     " made her crazy "  . Amoxicillin     REACTION: unspecified  . Ceftin [Cefuroxime Axetil] Diarrhea  . Codeine Phosphate     REACTION: unspecified  . Colchicine     Severe diarrhea  . Lipitor [Atorvastatin] Other (See Comments)    "makes legs jump all night"  . Penicillins     REACTION: nausea, swelling  . Xifaxan [Rifaximin] Other (See Comments)    unknown    History   Social History  . Marital Status: Widowed    Spouse Name: N/A  . Number of Children: N/A  . Years of Education: N/A   Occupational History  . Not on file.   Social History Main Topics  . Smoking status: Never Smoker   . Smokeless tobacco: Never Used     Comment: never used tobacco  . Alcohol Use: No  . Drug Use: No  . Sexual Activity: No   Other Topics Concern  . Not on file   Social History Narrative   She is a widowed mother of 2, one child is deceased. Grandmother of one. She appears to be working out routinely at [employer] using [employer]. She is otherwise quite active.   She does not drink alcohol, does not smoke.  She is retired from [employer].    Lives alone in a one story  condo.     Family History  Problem Relation Age of Onset  . Cancer Mother 17  . Heart attack Father   . Heart disease Brother   . Stroke Maternal Grandmother   . Cancer Maternal Grandfather     Lung cancer  .  Stroke Paternal Grandmother   . Cancer Daughter      ROS:  Please see the history of present illness.     All other systems reviewed and negative.    Physical Exam: Blood pressure 165/47, pulse 51, temperature 98.1 F (36.7 C), temperature source Oral, resp. rate 14, height 5\' 4"  (1.626 m), weight 134 lb 14.7 oz (61.2 kg), SpO2 100 %. General: Well developed, well nourished female in no acute distress. Head: Normocephalic, atraumatic, sclera non-icteric, no xanthomas, nares are without discharge. EENT: normal Lymph Nodes:  none Back: without scoliosis/kyphosis , no CVA tendersness Neck: Negative for carotid bruits. JVD not elevated. Lungs: Clear bilaterally to auscultation without wheezes, rales, or rhonchi. Breathing is unlabored. Heart: RRR with S1 S2. 2 /6 systolic murmur , rubs, or gallops appreciated. Abdomen: Soft, non-tender, non-distended with normoactive bowel sounds. No hepatomegaly. No rebound/guarding. No obvious abdominal masses. Msk:  Strength and tone appear normal for age. Extremities: No clubbing or cyanosis. No edema.  Distal pedal pulses are 2+ and equal bilaterally. Skin: Warm and Dry Neuro: Alert and oriented X 3. CN III-XII intact Grossly normal sensory and motor function . Psych:  Responds to questions appropriately with a normal affect.      Labs: Cardiac Enzymes  Recent Labs  04/28/15 1542  TROPONINI 0.04*   CBC Lab Results  Component Value Date   WBC 5.9 04/28/2015   HGB 9.1* 04/28/2015   HCT 27.9* 04/28/2015   MCV 85.8 04/28/2015   PLT 199 04/28/2015   PROTIME:  Recent Labs  04/28/15 2123  LABPROT 15.1  INR 1.17   Chemistry  Recent Labs Lab 04/28/15 1542  NA 143  K 4.6  CL 103  CO2 25  BUN 60*  CREATININE 2.37*    CALCIUM 8.5*  PROT 6.2*  BILITOT 0.8  ALKPHOS 69  ALT 32  AST 31  GLUCOSE 113*   Lipids Lab Results  Component Value Date   CHOL 159 11/02/2013   HDL 47 11/02/2013   LDLCALC 90 11/02/2013   TRIG 111 11/02/2013   BNP PRO B NATRIURETIC PEPTIDE (BNP)  Date/Time Value Ref Range Status  12/03/2013 05:15 PM 3878.0* 0 - 450 pg/mL Final   Thyroid Function Tests:  Recent Labs  04/28/15 2123  TSH 1.548      Miscellaneous No results found for: DDIMER  Radiology/Studies:  Ct Head Wo Contrast  04/28/2015   CLINICAL DATA:  Syncope, head laceration after fall. Positive loss consciousness.  EXAM: CT HEAD WITHOUT CONTRAST  CT CERVICAL SPINE WITHOUT CONTRAST  TECHNIQUE: Multidetector CT imaging of the head and cervical spine was performed following the standard protocol without intravenous contrast. Multiplanar CT image reconstructions of the cervical spine were also generated.  COMPARISON:  CT scan of November 19, 2014.  FINDINGS: CT HEAD FINDINGS  Bony calvarium appears intact. Mild diffuse cortical atrophy is noted. No mass effect or midline shift is noted. Ventricular size is within normal limits. There is no evidence of mass lesion, hemorrhage or acute infarction.  CT CERVICAL SPINE FINDINGS  No fracture or spondylolisthesis is noted. Severe degenerative disc disease is noted at C4-5, C5-6 and C6-7 with anterior osteophyte formation. Posterior facet joints appear intact. Visualized lung apices appear normal.  IMPRESSION: Mild diffuse cortical atrophy. No acute intracranial abnormality seen.  Severe multilevel degenerative disc disease seen in cervical spine. No fracture or spondylolisthesis is noted.   Electronically Signed   By: Marijo Conception, M.D.   On: 04/28/2015 16:39  Ct Cervical Spine Wo Contrast  04/28/2015   CLINICAL DATA:  Syncope, head laceration after fall. Positive loss consciousness.  EXAM: CT HEAD WITHOUT CONTRAST  CT CERVICAL SPINE WITHOUT CONTRAST  TECHNIQUE: Multidetector  CT imaging of the head and cervical spine was performed following the standard protocol without intravenous contrast. Multiplanar CT image reconstructions of the cervical spine were also generated.  COMPARISON:  CT scan of November 19, 2014.  FINDINGS: CT HEAD FINDINGS  Bony calvarium appears intact. Mild diffuse cortical atrophy is noted. No mass effect or midline shift is noted. Ventricular size is within normal limits. There is no evidence of mass lesion, hemorrhage or acute infarction.  CT CERVICAL SPINE FINDINGS  No fracture or spondylolisthesis is noted. Severe degenerative disc disease is noted at C4-5, C5-6 and C6-7 with anterior osteophyte formation. Posterior facet joints appear intact. Visualized lung apices appear normal.  IMPRESSION: Mild diffuse cortical atrophy. No acute intracranial abnormality seen.  Severe multilevel degenerative disc disease seen in cervical spine. No fracture or spondylolisthesis is noted.   Electronically Signed   By: Marijo Conception, M.D.   On: 04/28/2015 16:39   Dg Pelvis Portable  04/28/2015   CLINICAL DATA:  Recent syncopal episode with fall, pelvic pain  EXAM: PORTABLE PELVIS 1-2 VIEWS  COMPARISON:  02/26/2013  FINDINGS: The pelvic ring appears intact. Angulation along the superior aspect of the femoral head is noted with increased density along the femoral neck at the junction with the femoral head. This is highly suspicious for a mildly impacted fracture. Dedicated hip films or limited CT of the pelvis may be helpful. Degenerative changes of the lumbar spine are noted.  IMPRESSION: Changes in the proximal left femur as described suggestive of mildly impacted fracture.   Electronically Signed   By: Inez Catalina M.D.   On: 04/28/2015 16:37   Dg Chest Port 1 View  04/28/2015   CLINICAL DATA:  Fall, syncope, bradycardia  EXAM: PORTABLE CHEST - 1 VIEW  COMPARISON:  07/29/2014  FINDINGS: Pacing pads overlie the chest. Implanted monitor device projects over the left  hemithorax. Moderate enlargement of the cardiomediastinal silhouette is noted without evidence for edema. No displaced rib fracture allowing for technique. No focal pulmonary opacity. No supine evidence for pneumothorax. No pleural effusion. Presumed skin fold artifact simulating pneumothorax at the right lung base on repeat image 2.  IMPRESSION: Cardiomegaly without focal acute finding.   Electronically Signed   By: Conchita Paris M.D.   On: 04/28/2015 16:40   Dg Femur Min 2 Views Left  04/28/2015   CLINICAL DATA:  Closed left hip fracture, initial encounter.  EXAM: LEFT FEMUR 2 VIEWS  COMPARISON:  None.  FINDINGS: Findings consistent with mildly displaced proximal left femoral neck fracture. This appears closed and posttraumatic. Remaining portion of femur appears normal.  IMPRESSION: Probable mildly displaced proximal left femoral neck fracture.   Electronically Signed   By: Marijo Conception, M.D.   On: 04/28/2015 18:12    EKG: as above Tele> as above   Assessment and Plan:  1. Syncope  2 left bundle branch block  3. Proximal atrial fibrillation on amiodarone  4. Sinus arrest  5. Hip Fracture  6. Hypertension  The patient presents with syncope in the context of underlying left bundle branch block and then cooccuring of sinus node dysfunction and ventricular escape rhythm. There is VA conduction in the latter; however, it is surprising to me that the sinus rate seems suppressed by this and hence  there is a: Recurrence of 2 separate electrical events. Having said this, however, there is also evidence of Mobitz 2 heart block. While this may be attributable to the amiodarone that time of amiodarone excretion is so long, she would remain at high risk for syncope notwithstanding his discontinuation. Hence, pacing is indicated  She is scheduled for placement repair surgery Monday afternoon. We will plan to undertake pacemaker Monday morning  The benefits and risks were reviewed including but  not limited to death,  perforation, infection, lead dislodgement and device malfunction.  The patient understands agrees and is willing to proceed.  We will add amlodipine for blood pressure control  Virl Axe

## 2015-04-29 NOTE — Consult Note (Signed)
ORTHOPAEDIC CONSULTATION  REQUESTING PHYSICIAN: Barton Dubois, MD  PCP:  [name], MD  Chief Complaint: left hip pain  HPI: [name] is a 81 y.o. female who complains of  Left hip pain after a fall yesterday. Became dizzy and woke up on the groud. Was unable to WB due to left hip pain. Found to be bradycardic in the ED. Consulted for left femoral neck fracture. Denies other injuries other than scalp laceration. Lives alone. Community ambulator with cane or walker. Does her own shopping.  Past Medical History  Diagnosis Date  . Hyperlipidemia   . Hypothyroidism   . Arthritis   . Anemia   . Asthma   . Anxiety   . IBS (irritable bowel syndrome)   . Gout   . Colon polyps   . Tubulovillous adenoma 4/07  . Hemorrhoids   . Diverticulosis   . Chronic diarrhea   . Chronic kidney disease, stage IV (severe)   . Hypertensive heart disease     no significant RAS by MRA 2002- (<30% LRAS)   . Paroxysmal atrial fibrillation 11/01/2013    Intermittent through the years and recurrent in December of 2014 treated with amiodarone;; Negative Myoview 10/2013  . Peripheral vascular disease     123456 LICA, [id number] RICA by doppler 2009   . Benign positional vertigo   . LBBB (left bundle branch block)- new 11/01/13 11/01/2013  . Diastolic dysfunction, left ventricle 11/03/2013  . Syncope and collapse 04/28/2015  . Sinus arrest 04/28/2015  . LBBB (left bundle branch block)    Past Surgical History  Procedure Laterality Date  . Lumbar disc surgery  2000  . Squamous cell skin cancer removed    . Oophorectomy  1962    right  . Appendectomy    . Low anterior bowel resection  7/08  . Cataract extraction    . Transthoracic echocardiogram  11/03/2013    Echocardiogram: EF 55-60%, mild LVH, elevated bili pressures. Mild aortic valve calcification.  Marland Kitchen Nm myoview ltd  11/02/2013    Negative for ischemia or infarction. EF 60%.   History   Social History  . Marital Status: Widowed   Spouse Name: N/A  . Number of Children: N/A  . Years of Education: N/A   Social History Main Topics  . Smoking status: Never Smoker   . Smokeless tobacco: Never Used     Comment: never used tobacco  . Alcohol Use: No  . Drug Use: No  . Sexual Activity: No   Other Topics Concern  . None   Social History Narrative   She is a widowed mother of 2, one child is deceased. Grandmother of one. She appears to be working out routinely at [employer] using [employer]. She is otherwise quite active.   She does not drink alcohol, does not smoke.  She is retired from [employer].    Lives alone in a one story condo.   Family History  Problem Relation Age of Onset  . Cancer Mother 55  . Heart attack Father   . Heart disease Brother   . Stroke Maternal Grandmother   . Cancer Maternal Grandfather     Lung cancer  . Stroke Paternal Grandmother   . Cancer Daughter    Allergies  Allergen Reactions  . Ambien [Zolpidem Tartrate]     " made her crazy "  . Amoxicillin     REACTION: unspecified  . Ceftin [Cefuroxime Axetil] Diarrhea  . Codeine Phosphate  REACTION: unspecified  . Colchicine     Severe diarrhea  . Lipitor [Atorvastatin] Other (See Comments)    "makes legs jump all night"  . Penicillins     REACTION: nausea, swelling  . Xifaxan [Rifaximin] Other (See Comments)    unknown   Prior to Admission medications   Medication Sig Start Date End Date Taking? Authorizing Provider  amiodarone (PACERONE) 200 MG tablet Take 0.5 tablets (100 mg total) by mouth daily. 04/27/15  Yes [name], MD  [facility] VITAMIN B12 1000 MCG tablet TAKE 1 TABLET BY MOUTH EVERY DAY 01/06/15  Yes [name], MD  ELIQUIS 2.5 MG TABS tablet TAKE 1 TABLET BY MOUTH TWICE A DAY 12/01/14  Yes [name], MD  furosemide (LASIX) 40 MG tablet Take 1 tablet (40 mg total) by mouth daily as needed for fluid. 03/23/15  Yes Luke K Kilroy, PA-C  hydrALAZINE (APRESOLINE) 100 MG tablet Take 1  tablet (100 mg total) by mouth 3 (three) times daily. 10/03/14  Yes [name], MD  hyoscyamine (LEVSIN, ANASPAZ) 0.125 MG tablet Take 1-2 tablets by mouth or under tongue every 4 hours as needed before meals 02/16/15  Yes Ladene Artist, MD  isosorbide mononitrate (IMDUR) 60 MG 24 hr tablet TAKE 1 TABLET (60 MG TOTAL) BY MOUTH DAILY. 03/14/15  Yes [name], MD  levothyroxine (SYNTHROID, LEVOTHROID) 88 MCG tablet TAKE 1 TABLET (88 MCG TOTAL) BY MOUTH DAILY BEFORE BREAKFAST. 03/30/15  Yes [name], MD  LORazepam (ATIVAN) 0.5 MG tablet Take 1 tablet (0.5 mg total) by mouth every 8 (eight) hours as needed for anxiety. Patient taking differently: Take 0.5 mg by mouth daily as needed for anxiety.  11/15/14  Yes [name], MD  nitroGLYCERIN (NITROSTAT) 0.4 MG SL tablet Place 1 tablet (0.4 mg total) under the tongue every 5 (five) minutes x 3 doses as needed for chest pain. 07/31/14  Yes Rogelia Mire, NP  predniSONE (DELTASONE) 10 MG tablet Take 4 tabs a day for 4 days, then 3 a day for 4 days, then 2 a day for 4 days, then 1 a day for 4 days, then stop 04/17/15  Yes [name], MD  PROAIR HFA 108 (90 BASE) MCG/ACT inhaler Inhale 2 puffs into the lungs every 4 (four) hours as needed. Severe cough/wheeze 10/18/14  Yes Historical Provider, MD  temazepam (RESTORIL) 30 MG capsule Take 1 capsule (30 mg total) by mouth at bedtime as needed for sleep. 11/15/14  Yes [name], MD  omeprazole (PRILOSEC) 40 MG capsule Take 1 capsule (40 mg total) by mouth daily. 04/17/15   [name], MD   Ct Head Wo Contrast  04/28/2015   CLINICAL DATA:  Syncope, head laceration after fall. Positive loss consciousness.  EXAM: CT HEAD WITHOUT CONTRAST  CT CERVICAL SPINE WITHOUT CONTRAST  TECHNIQUE: Multidetector CT imaging of the head and cervical spine was performed following the standard protocol without intravenous contrast. Multiplanar CT image reconstructions of the cervical spine were also generated.   COMPARISON:  CT scan of November 19, 2014.  FINDINGS: CT HEAD FINDINGS  Bony calvarium appears intact. Mild diffuse cortical atrophy is noted. No mass effect or midline shift is noted. Ventricular size is within normal limits. There is no evidence of mass lesion, hemorrhage or acute infarction.  CT CERVICAL SPINE FINDINGS  No fracture or spondylolisthesis is noted. Severe degenerative disc disease is noted at C4-5, C5-6 and C6-7 with anterior osteophyte formation.  Posterior facet joints appear intact. Visualized lung apices appear normal.  IMPRESSION: Mild diffuse cortical atrophy. No acute intracranial abnormality seen.  Severe multilevel degenerative disc disease seen in cervical spine. No fracture or spondylolisthesis is noted.   Electronically Signed   By: Marijo Conception, M.D.   On: 04/28/2015 16:39   Ct Cervical Spine Wo Contrast  04/28/2015   CLINICAL DATA:  Syncope, head laceration after fall. Positive loss consciousness.  EXAM: CT HEAD WITHOUT CONTRAST  CT CERVICAL SPINE WITHOUT CONTRAST  TECHNIQUE: Multidetector CT imaging of the head and cervical spine was performed following the standard protocol without intravenous contrast. Multiplanar CT image reconstructions of the cervical spine were also generated.  COMPARISON:  CT scan of November 19, 2014.  FINDINGS: CT HEAD FINDINGS  Bony calvarium appears intact. Mild diffuse cortical atrophy is noted. No mass effect or midline shift is noted. Ventricular size is within normal limits. There is no evidence of mass lesion, hemorrhage or acute infarction.  CT CERVICAL SPINE FINDINGS  No fracture or spondylolisthesis is noted. Severe degenerative disc disease is noted at C4-5, C5-6 and C6-7 with anterior osteophyte formation. Posterior facet joints appear intact. Visualized lung apices appear normal.  IMPRESSION: Mild diffuse cortical atrophy. No acute intracranial abnormality seen.  Severe multilevel degenerative disc disease seen in cervical spine. No fracture or  spondylolisthesis is noted.   Electronically Signed   By: Marijo Conception, M.D.   On: 04/28/2015 16:39   Dg Pelvis Portable  04/28/2015   CLINICAL DATA:  Recent syncopal episode with fall, pelvic pain  EXAM: PORTABLE PELVIS 1-2 VIEWS  COMPARISON:  02/26/2013  FINDINGS: The pelvic ring appears intact. Angulation along the superior aspect of the femoral head is noted with increased density along the femoral neck at the junction with the femoral head. This is highly suspicious for a mildly impacted fracture. Dedicated hip films or limited CT of the pelvis may be helpful. Degenerative changes of the lumbar spine are noted.  IMPRESSION: Changes in the proximal left femur as described suggestive of mildly impacted fracture.   Electronically Signed   By: Inez Catalina M.D.   On: 04/28/2015 16:37   Dg Chest Port 1 View  04/28/2015   CLINICAL DATA:  Fall, syncope, bradycardia  EXAM: PORTABLE CHEST - 1 VIEW  COMPARISON:  07/29/2014  FINDINGS: Pacing pads overlie the chest. Implanted monitor device projects over the left hemithorax. Moderate enlargement of the cardiomediastinal silhouette is noted without evidence for edema. No displaced rib fracture allowing for technique. No focal pulmonary opacity. No supine evidence for pneumothorax. No pleural effusion. Presumed skin fold artifact simulating pneumothorax at the right lung base on repeat image 2.  IMPRESSION: Cardiomegaly without focal acute finding.   Electronically Signed   By: Conchita Paris M.D.   On: 04/28/2015 16:40   Dg Femur Min 2 Views Left  04/28/2015   CLINICAL DATA:  Closed left hip fracture, initial encounter.  EXAM: LEFT FEMUR 2 VIEWS  COMPARISON:  None.  FINDINGS: Findings consistent with mildly displaced proximal left femoral neck fracture. This appears closed and posttraumatic. Remaining portion of femur appears normal.  IMPRESSION: Probable mildly displaced proximal left femoral neck fracture.   Electronically Signed   By: Marijo Conception, M.D.    On: 04/28/2015 18:12    Positive ROS: All other systems have been reviewed and were otherwise negative with the exception of those mentioned in the HPI and as above.  Physical Exam: General: Alert, no acute  distress Cardiovascular: No pedal edema Respiratory: No cyanosis, no use of accessory musculature GI: No organomegaly, abdomen is soft and non-tender Skin: No lesions in the area of chief complaint Neurologic: Sensation intact distally Psychiatric: Patient is competent for consent with normal mood and affect Lymphatic: No axillary or cervical lymphadenopathy  MUSCULOSKELETAL: LLE shortened and externally rotated. Pain with logroll. Unable to SLR. Has ecchymosis/hematoma to lateral hip. 2+ DP. + TA/GS/EHL.   Assessment: Displaced left femoral neck fracture with comminution and retroversion of the femoral head Dysrhythmia  Plan: I discussed the findings with the patient. We discussed closed reduction percutaneous pinning vs arthroplasty. She elects to proceed with arthroplasty which will more predictably restore her mobility and function. We must wait until Monday due to Eliquis.  The risks, benefits, and alternatives were discussed with the patient. There are risks associated with the surgery including, but not limited to, problems with anesthesia (death), infection, dislocation, instability (giving out of the joint), dislocation, differences in leg length/angulation/rotation, fracture of bones, loosening or failure of implants, hematoma (blood accumulation) which may require surgical drainage, blood clots, pulmonary embolism, nerve injury (foot drop and lateral thigh numbness), and blood vessel injury. The patient understands these risks and elects to proceed.   I discussed the patient with Dr. Caryl Comes, who plans for pacemaker early Monday morning. Will follow Monday afternoon with left hip hemiarthroplasty vs THA. We will restart Eliquis on Tues am. Bedrest for now. NPO after MN on  Sunday night.    Tamora Huneke, Horald Pollen, MD Cell [phone]    04/29/2015 8:24 AM

## 2015-04-29 NOTE — Progress Notes (Signed)
[facility] PROGRESS NOTE  [name] [id number] DOB: September 14, 1924 DOA: 04/28/2015 PCP: [name], MD  Assessment/Plan: 1-Syncope and collapse: appears to be cardiogenic in nature due to tachy-brady syndrome -will continue holding b-blockers and amiodarone -patient will remains in stepdown with with external pacers -cardiology on board and planning for pacemaker implantation on Monday (6/20) -eliquis/anticoagulation on hold currently given hematoma and needs for surgical intervention -will follow and replete electrolytes as needed   2-Hypothyroidism:  -TSH WNL -continue synthroid at current dose  3-Hypertensive heart disease: uncontrolled -pain most likely contributing -will use hydralazine -continue imdur -cardiology has added cozaar and low dose amlodipine -HR has remained stable -will monitor  4-left hip fracture -high risk for surgery, but benefits outweigh risks -first will need to have pacemaker (most likely temporary) in place and be clear by cardiology -PRN analgesics and bed rest currently -ortho is on board -will continue holding eliquis and heparin products  5-GERD (gastroesophageal reflux disease): continue PPI  6-anemia of chronic disease: slightly worse, most likely from hematoma on left hip after fall -will hold eliquis and heparin products for now -will monitor Hgb trend -CBC in am -no signs of overt bleeding on exam  7-chronic kidney disease, stage III: Cr at baseline -will monitor and follow renal function trend  8-chronic diastolic heart failure: compensated -will follow daily weights -strict intake and output -heart healthy diet -no SOB at this moment  9-IBS: will continue PRN levsin  Code Status: Full Family Communication: no family at bedside Disposition Plan: remains inpatient; Monday with tentative pace maker and subsequently hip fracture.   Consultants:  Cardiology  Orthopedic service   Procedures:  See below for  x-ray reports   Antibiotics:  None   HPI/Subjective: Feeling ok, no SOB and no fever. Complaining of left hip pain with movements. HR stable, sinus bradycardia.  Objective: Filed Vitals:   04/29/15 1400  BP: 203/57  Pulse: 63  Temp:   Resp: 12    Intake/Output Summary (Last 24 hours) at 04/29/15 1438 Last data filed at 04/29/15 1200  Gross per 24 hour  Intake    840 ml  Output   2050 ml  Net  -1210 ml   Filed Weights   04/29/15 0042 04/29/15 0500  Weight: 60.9 kg (134 lb 4.2 oz) 61.2 kg (134 lb 14.7 oz)    Exam:   General:  Afebrile, no SOB, patient with hip pain with movement; denies any further episodes of lightheadedness/dizzy feeling  Cardiovascular: sinus bradycardia currently, positive SM, no rubs  Respiratory: no wheezing, no crackles; good air movement  Abdomen: soft, NT, ND, positive BS  Musculoskeletal: no edema or cyanosis   Data Reviewed: Basic Metabolic Panel:  Recent Labs Lab 04/28/15 1542 04/28/15 2123  NA 143  --   K 4.6  --   CL 103  --   CO2 25  --   GLUCOSE 113*  --   BUN 60*  --   CREATININE 2.37*  --   CALCIUM 8.5*  --   MG  --  1.7  PHOS  --  4.1   Liver Function Tests:  Recent Labs Lab 04/28/15 1542  AST 31  ALT 32  ALKPHOS 69  BILITOT 0.8  PROT 6.2*  ALBUMIN 3.5   CBC:  Recent Labs Lab 04/28/15 1542  WBC 5.9  NEUTROABS 4.0  HGB 9.1*  HCT 27.9*  MCV 85.8  PLT 199   Cardiac Enzymes:  Recent Labs Lab 04/28/15 1542  TROPONINI 0.04*  CBG:  Recent Labs Lab 04/28/15 1659  GLUCAP 110*    Recent Results (from the past 240 hour(s))  MRSA PCR Screening     Status: None   Collection Time: 04/28/15  8:05 PM  Result Value Ref Range Status   MRSA by PCR NEGATIVE NEGATIVE Final    Comment:        The GeneXpert MRSA Assay (FDA approved for NASAL specimens only), is one component of a comprehensive MRSA colonization surveillance program. It is not intended to diagnose MRSA infection nor to guide  or monitor treatment for MRSA infections.      Studies: Ct Head Wo Contrast  04/28/2015   CLINICAL DATA:  Syncope, head laceration after fall. Positive loss consciousness.  EXAM: CT HEAD WITHOUT CONTRAST  CT CERVICAL SPINE WITHOUT CONTRAST  TECHNIQUE: Multidetector CT imaging of the head and cervical spine was performed following the standard protocol without intravenous contrast. Multiplanar CT image reconstructions of the cervical spine were also generated.  COMPARISON:  CT scan of November 19, 2014.  FINDINGS: CT HEAD FINDINGS  Bony calvarium appears intact. Mild diffuse cortical atrophy is noted. No mass effect or midline shift is noted. Ventricular size is within normal limits. There is no evidence of mass lesion, hemorrhage or acute infarction.  CT CERVICAL SPINE FINDINGS  No fracture or spondylolisthesis is noted. Severe degenerative disc disease is noted at C4-5, C5-6 and C6-7 with anterior osteophyte formation. Posterior facet joints appear intact. Visualized lung apices appear normal.  IMPRESSION: Mild diffuse cortical atrophy. No acute intracranial abnormality seen.  Severe multilevel degenerative disc disease seen in cervical spine. No fracture or spondylolisthesis is noted.   Electronically Signed   By: Marijo Conception, M.D.   On: 04/28/2015 16:39   Ct Cervical Spine Wo Contrast  04/28/2015   CLINICAL DATA:  Syncope, head laceration after fall. Positive loss consciousness.  EXAM: CT HEAD WITHOUT CONTRAST  CT CERVICAL SPINE WITHOUT CONTRAST  TECHNIQUE: Multidetector CT imaging of the head and cervical spine was performed following the standard protocol without intravenous contrast. Multiplanar CT image reconstructions of the cervical spine were also generated.  COMPARISON:  CT scan of November 19, 2014.  FINDINGS: CT HEAD FINDINGS  Bony calvarium appears intact. Mild diffuse cortical atrophy is noted. No mass effect or midline shift is noted. Ventricular size is within normal limits. There is no  evidence of mass lesion, hemorrhage or acute infarction.  CT CERVICAL SPINE FINDINGS  No fracture or spondylolisthesis is noted. Severe degenerative disc disease is noted at C4-5, C5-6 and C6-7 with anterior osteophyte formation. Posterior facet joints appear intact. Visualized lung apices appear normal.  IMPRESSION: Mild diffuse cortical atrophy. No acute intracranial abnormality seen.  Severe multilevel degenerative disc disease seen in cervical spine. No fracture or spondylolisthesis is noted.   Electronically Signed   By: Marijo Conception, M.D.   On: 04/28/2015 16:39   Dg Pelvis Portable  04/28/2015   CLINICAL DATA:  Recent syncopal episode with fall, pelvic pain  EXAM: PORTABLE PELVIS 1-2 VIEWS  COMPARISON:  02/26/2013  FINDINGS: The pelvic ring appears intact. Angulation along the superior aspect of the femoral head is noted with increased density along the femoral neck at the junction with the femoral head. This is highly suspicious for a mildly impacted fracture. Dedicated hip films or limited CT of the pelvis may be helpful. Degenerative changes of the lumbar spine are noted.  IMPRESSION: Changes in the proximal left femur as described suggestive of mildly  impacted fracture.   Electronically Signed   By: Inez Catalina M.D.   On: 04/28/2015 16:37   Dg Chest Port 1 View  04/28/2015   CLINICAL DATA:  Fall, syncope, bradycardia  EXAM: PORTABLE CHEST - 1 VIEW  COMPARISON:  07/29/2014  FINDINGS: Pacing pads overlie the chest. Implanted monitor device projects over the left hemithorax. Moderate enlargement of the cardiomediastinal silhouette is noted without evidence for edema. No displaced rib fracture allowing for technique. No focal pulmonary opacity. No supine evidence for pneumothorax. No pleural effusion. Presumed skin fold artifact simulating pneumothorax at the right lung base on repeat image 2.  IMPRESSION: Cardiomegaly without focal acute finding.   Electronically Signed   By: Conchita Paris M.D.    On: 04/28/2015 16:40   Dg Femur Min 2 Views Left  04/28/2015   CLINICAL DATA:  Closed left hip fracture, initial encounter.  EXAM: LEFT FEMUR 2 VIEWS  COMPARISON:  None.  FINDINGS: Findings consistent with mildly displaced proximal left femoral neck fracture. This appears closed and posttraumatic. Remaining portion of femur appears normal.  IMPRESSION: Probable mildly displaced proximal left femoral neck fracture.   Electronically Signed   By: Marijo Conception, M.D.   On: 04/28/2015 18:12    Scheduled Meds: . amLODipine  5 mg Oral Daily  . chlorhexidine  60 mL Topical Once  . chlorhexidine  60 mL Topical Once  . gentamicin irrigation  80 mg Irrigation On Call  . hydrALAZINE  100 mg Oral 3 times per day  . isosorbide mononitrate  60 mg Oral Daily  . levothyroxine  88 mcg Oral QAC breakfast  . losartan  50 mg Oral Daily  . pantoprazole  40 mg Oral Daily  . sodium chloride  3 mL Intravenous Q12H  . sodium chloride  3 mL Intravenous Q12H  . vancomycin  1,000 mg Intravenous On Call  . [START ON 05/01/2015] vancomycin  1,000 mg Intravenous To SS-Surg   Continuous Infusions: . sodium chloride    . sodium chloride      Principal Problem:   Syncope and collapse Active Problems:   Hypothyroidism   Hypertensive heart disease   GERD (gastroesophageal reflux disease)   LBBB (left bundle branch block)- new 11/01/13   H/O tachycardia-bradycardia syndrome   Benign hypertension with chronic kidney disease, stage III   Systolic hypertension, isolated; difficult to control   Symptomatic bradycardia   Right bundle branch block (RBBB) intermittent    Fracture, femur, lt   Laceration of scalp    Time spent: 35 minutes    Barton Dubois  [facility] Pager [phone]. If 7PM-7AM, please contact night-coverage at [email or web address], password [facility] 04/29/2015, 2:38 PM  LOS: 1 day

## 2015-04-30 DIAGNOSIS — I1310 Hypertensive heart and chronic kidney disease without heart failure, with stage 1 through stage 4 chronic kidney disease, or unspecified chronic kidney disease: Secondary | ICD-10-CM

## 2015-04-30 DIAGNOSIS — N184 Chronic kidney disease, stage 4 (severe): Secondary | ICD-10-CM

## 2015-04-30 DIAGNOSIS — D6489 Other specified anemias: Secondary | ICD-10-CM

## 2015-04-30 LAB — CBC
HCT: 21.4 % — ABNORMAL LOW (ref 36.0–46.0)
Hemoglobin: 7.1 g/dL — ABNORMAL LOW (ref 12.0–15.0)
MCH: 28.3 pg (ref 26.0–34.0)
MCHC: 33.2 g/dL (ref 30.0–36.0)
MCV: 85.3 fL (ref 78.0–100.0)
Platelets: 134 10*3/uL — ABNORMAL LOW (ref 150–400)
RBC: 2.51 MIL/uL — ABNORMAL LOW (ref 3.87–5.11)
RDW: 18 % — ABNORMAL HIGH (ref 11.5–15.5)
WBC: 4.3 10*3/uL (ref 4.0–10.5)

## 2015-04-30 LAB — BASIC METABOLIC PANEL WITH GFR
Anion gap: 7 (ref 5–15)
BUN: 48 mg/dL — ABNORMAL HIGH (ref 6–20)
CO2: 30 mmol/L (ref 22–32)
Calcium: 8.2 mg/dL — ABNORMAL LOW (ref 8.9–10.3)
Chloride: 100 mmol/L — ABNORMAL LOW (ref 101–111)
Creatinine, Ser: 2.25 mg/dL — ABNORMAL HIGH (ref 0.44–1.00)
GFR calc Af Amer: 21 mL/min — ABNORMAL LOW
GFR calc non Af Amer: 18 mL/min — ABNORMAL LOW
Glucose, Bld: 101 mg/dL — ABNORMAL HIGH (ref 65–99)
Potassium: 4.1 mmol/L (ref 3.5–5.1)
Sodium: 137 mmol/L (ref 135–145)

## 2015-04-30 LAB — PREPARE RBC (CROSSMATCH)

## 2015-04-30 LAB — SURGICAL PCR SCREEN
MRSA, PCR: NEGATIVE
Staphylococcus aureus: NEGATIVE

## 2015-04-30 LAB — TROPONIN I: Troponin I: 0.05 ng/mL — ABNORMAL HIGH

## 2015-04-30 LAB — ABO/RH: ABO/RH(D): AB POS

## 2015-04-30 MED ORDER — SODIUM CHLORIDE 0.9 % IV SOLN: Freq: Once | INTRAVENOUS | Status: DC

## 2015-04-30 MED ORDER — HYDRALAZINE HCL 50 MG PO TABS
150.0000 mg | ORAL_TABLET | Freq: Three times a day (TID) | ORAL | Status: DC
  Administered 2015-04-30 – 2015-05-03 (×9): 150 mg via ORAL
  Filled 2015-04-30 (×12): qty 3

## 2015-04-30 MED ORDER — FUROSEMIDE 10 MG/ML IJ SOLN
20.0000 mg | Freq: Once | INTRAMUSCULAR | Status: AC
  Administered 2015-04-30: 20 mg via INTRAVENOUS
  Filled 2015-04-30: qty 2

## 2015-04-30 MED ORDER — SODIUM CHLORIDE 0.9 % IV SOLN
Freq: Once | INTRAVENOUS | Status: AC
  Administered 2015-04-30: 10 mL/h via INTRAVENOUS

## 2015-04-30 NOTE — Progress Notes (Signed)
[facility] PROGRESS NOTE  [name] [id number] DOB: 09/13/1924 DOA: 04/28/2015 PCP: [name], MD  Assessment/Plan: 1-Syncope and collapse: appears to be cardiogenic in nature due to tachy-brady syndrome -will continue holding b-blockers and amiodarone -patient will remains in stepdown with with external pacers -cardiology on board and planning for pacemaker implantation on Monday (6/20) -eliquis/anticoagulation on hold currently given hematoma and needs for surgical intervention -will follow and replete electrolytes as needed   2-Hypothyroidism:  -TSH WNL -continue synthroid at current dose  3-Hypertensive heart disease: uncontrolled -pain most likely contributing -will continue hydralazine, dose increased as per cardiology rec's -continue imdur -cardiology has now discontinue losartan and increased dose of amlodipine -HR has remained stable -will monitor  4-left hip fracture -high risk for surgery, but benefits outweigh risks -first will need to have pacemaker (most likely temporary) in place and be clear by cardiology -PRN analgesics and bed rest for now -ortho is on board -will continue holding eliquis and heparin products  5-GERD (gastroesophageal reflux disease): continue PPI  6-anemia of chronic disease: slightly worse, most likely from hematoma on left hip after fall -will hold eliquis and heparin products for now -will follow Hgb trend -CBC 7.1 -no signs of overt bleeding  -given anticipated blood loss with pending surgery will transfuse 2 units  7-chronic kidney disease, stage III:  -Cr at baseline -will monitor and follow renal function trend  8-chronic diastolic heart failure: compensated -will follow daily weights -strict intake and output -heart healthy diet -no SOB at this moment  9-IBS: will continue PRN levsin  Code Status: Full Family Communication: no family at bedside Disposition Plan: remains inpatient; Monday with  tentative pace maker and subsequently hip fracture.   Consultants:  Cardiology  Orthopedic service   Procedures:  See below for x-ray reports   Antibiotics:  None   HPI/Subjective: Feeling ok, no SOB and no fever. Left hip pain with movement. Denies CP  Objective: Filed Vitals:   04/30/15 1300  BP: 166/43  Pulse: 61  Temp:   Resp: 16    Intake/Output Summary (Last 24 hours) at 04/30/15 1455 Last data filed at 04/30/15 1300  Gross per 24 hour  Intake   1143 ml  Output   1250 ml  Net   -107 ml   Filed Weights   04/29/15 0042 04/29/15 0500 04/30/15 0500  Weight: 60.9 kg (134 lb 4.2 oz) 61.2 kg (134 lb 14.7 oz) 62 kg (136 lb 11 oz)    Exam:   General:  Afebrile, no SOB, patient with hip pain when moving her left leg; but reports is controlled with medications and been still. No further episodes of lightheadedness and/or dizzy feeling. HR stable  Cardiovascular: sinus bradycardia currently, positive SM, no rubs  Respiratory: no wheezing, no crackles; good air movement  Abdomen: soft, NT, ND, positive BS  Musculoskeletal: no edema or cyanosis   Data Reviewed: Basic Metabolic Panel:  Recent Labs Lab 04/28/15 1542 04/28/15 2123 04/30/15 0305  NA 143  --  137  K 4.6  --  4.1  CL 103  --  100*  CO2 25  --  30  GLUCOSE 113*  --  101*  BUN 60*  --  48*  CREATININE 2.37*  --  2.25*  CALCIUM 8.5*  --  8.2*  MG  --  1.7  --   PHOS  --  4.1  --    Liver Function Tests:  Recent Labs Lab 04/28/15 1542  AST 31  ALT  32  ALKPHOS 69  BILITOT 0.8  PROT 6.2*  ALBUMIN 3.5   CBC:  Recent Labs Lab 04/28/15 1542 04/30/15 0305  WBC 5.9 4.3  NEUTROABS 4.0  --   HGB 9.1* 7.1*  HCT 27.9* 21.4*  MCV 85.8 85.3  PLT 199 134*   Cardiac Enzymes:  Recent Labs Lab 04/28/15 1542 04/30/15 0305  TROPONINI 0.04* 0.05*   CBG:  Recent Labs Lab 04/28/15 1659  GLUCAP 110*    Recent Results (from the past 240 hour(s))  MRSA PCR Screening     Status:  None   Collection Time: 04/28/15  8:05 PM  Result Value Ref Range Status   MRSA by PCR NEGATIVE NEGATIVE Final    Comment:        The GeneXpert MRSA Assay (FDA approved for NASAL specimens only), is one component of a comprehensive MRSA colonization surveillance program. It is not intended to diagnose MRSA infection nor to guide or monitor treatment for MRSA infections.   Surgical pcr screen     Status: None   Collection Time: 04/29/15 10:17 PM  Result Value Ref Range Status   MRSA, PCR NEGATIVE NEGATIVE Final   Staphylococcus aureus NEGATIVE NEGATIVE Final    Comment:        The Xpert SA Assay (FDA approved for NASAL specimens in patients over 16 years of age), is one component of a comprehensive surveillance program.  Test performance has been validated by [facility] for patients greater than or equal to 91 year old. It is not intended to diagnose infection nor to guide or monitor treatment.      Studies: Ct Head Wo Contrast  04/28/2015   CLINICAL DATA:  Syncope, head laceration after fall. Positive loss consciousness.  EXAM: CT HEAD WITHOUT CONTRAST  CT CERVICAL SPINE WITHOUT CONTRAST  TECHNIQUE: Multidetector CT imaging of the head and cervical spine was performed following the standard protocol without intravenous contrast. Multiplanar CT image reconstructions of the cervical spine were also generated.  COMPARISON:  CT scan of November 19, 2014.  FINDINGS: CT HEAD FINDINGS  Bony calvarium appears intact. Mild diffuse cortical atrophy is noted. No mass effect or midline shift is noted. Ventricular size is within normal limits. There is no evidence of mass lesion, hemorrhage or acute infarction.  CT CERVICAL SPINE FINDINGS  No fracture or spondylolisthesis is noted. Severe degenerative disc disease is noted at C4-5, C5-6 and C6-7 with anterior osteophyte formation. Posterior facet joints appear intact. Visualized lung apices appear normal.  IMPRESSION: Mild diffuse cortical  atrophy. No acute intracranial abnormality seen.  Severe multilevel degenerative disc disease seen in cervical spine. No fracture or spondylolisthesis is noted.   Electronically Signed   By: Marijo Conception, M.D.   On: 04/28/2015 16:39   Ct Cervical Spine Wo Contrast  04/28/2015   CLINICAL DATA:  Syncope, head laceration after fall. Positive loss consciousness.  EXAM: CT HEAD WITHOUT CONTRAST  CT CERVICAL SPINE WITHOUT CONTRAST  TECHNIQUE: Multidetector CT imaging of the head and cervical spine was performed following the standard protocol without intravenous contrast. Multiplanar CT image reconstructions of the cervical spine were also generated.  COMPARISON:  CT scan of November 19, 2014.  FINDINGS: CT HEAD FINDINGS  Bony calvarium appears intact. Mild diffuse cortical atrophy is noted. No mass effect or midline shift is noted. Ventricular size is within normal limits. There is no evidence of mass lesion, hemorrhage or acute infarction.  CT CERVICAL SPINE FINDINGS  No fracture or spondylolisthesis is  noted. Severe degenerative disc disease is noted at C4-5, C5-6 and C6-7 with anterior osteophyte formation. Posterior facet joints appear intact. Visualized lung apices appear normal.  IMPRESSION: Mild diffuse cortical atrophy. No acute intracranial abnormality seen.  Severe multilevel degenerative disc disease seen in cervical spine. No fracture or spondylolisthesis is noted.   Electronically Signed   By: Marijo Conception, M.D.   On: 04/28/2015 16:39   Dg Pelvis Portable  04/28/2015   CLINICAL DATA:  Recent syncopal episode with fall, pelvic pain  EXAM: PORTABLE PELVIS 1-2 VIEWS  COMPARISON:  02/26/2013  FINDINGS: The pelvic ring appears intact. Angulation along the superior aspect of the femoral head is noted with increased density along the femoral neck at the junction with the femoral head. This is highly suspicious for a mildly impacted fracture. Dedicated hip films or limited CT of the pelvis may be helpful.  Degenerative changes of the lumbar spine are noted.  IMPRESSION: Changes in the proximal left femur as described suggestive of mildly impacted fracture.   Electronically Signed   By: Inez Catalina M.D.   On: 04/28/2015 16:37   Dg Chest Port 1 View  04/28/2015   CLINICAL DATA:  Fall, syncope, bradycardia  EXAM: PORTABLE CHEST - 1 VIEW  COMPARISON:  07/29/2014  FINDINGS: Pacing pads overlie the chest. Implanted monitor device projects over the left hemithorax. Moderate enlargement of the cardiomediastinal silhouette is noted without evidence for edema. No displaced rib fracture allowing for technique. No focal pulmonary opacity. No supine evidence for pneumothorax. No pleural effusion. Presumed skin fold artifact simulating pneumothorax at the right lung base on repeat image 2.  IMPRESSION: Cardiomegaly without focal acute finding.   Electronically Signed   By: Conchita Paris M.D.   On: 04/28/2015 16:40   Dg Femur Min 2 Views Left  04/28/2015   CLINICAL DATA:  Closed left hip fracture, initial encounter.  EXAM: LEFT FEMUR 2 VIEWS  COMPARISON:  None.  FINDINGS: Findings consistent with mildly displaced proximal left femoral neck fracture. This appears closed and posttraumatic. Remaining portion of femur appears normal.  IMPRESSION: Probable mildly displaced proximal left femoral neck fracture.   Electronically Signed   By: Marijo Conception, M.D.   On: 04/28/2015 18:12    Scheduled Meds: . sodium chloride   Intravenous Once  . sodium chloride   Intravenous Once  . amLODipine  5 mg Oral Daily  . [START ON 05/01/2015] chlorhexidine  60 mL Topical Once  . chlorhexidine  60 mL Topical Once  . furosemide  20 mg Intravenous Once  . [START ON 05/01/2015] gentamicin irrigation  80 mg Irrigation To Cath  . hydrALAZINE  150 mg Oral 3 times per day  . isosorbide mononitrate  60 mg Oral Daily  . levothyroxine  88 mcg Oral QAC breakfast  . pantoprazole  40 mg Oral Daily  . sodium chloride  3 mL Intravenous Q12H  .  sodium chloride  3 mL Intravenous Q12H  . [START ON 05/01/2015] vancomycin  1,000 mg Intravenous To SS-Surg  . [START ON 05/01/2015] vancomycin  1,000 mg Intravenous To Cath   Continuous Infusions: . [START ON 05/01/2015] sodium chloride    . [START ON 05/01/2015] sodium chloride      Principal Problem:   Syncope and collapse Active Problems:   Hypothyroidism   Hypertensive heart disease   GERD (gastroesophageal reflux disease)   LBBB (left bundle branch block)- new 11/01/13   Hypertensive heart/kidney disease w/chronic kidney disease stage IV  Systolic hypertension, isolated; difficult to control   Symptomatic bradycardia   Right bundle branch block (RBBB) intermittent    Fracture, femur, lt   Laceration of scalp    Time spent: 35 minutes    Barton Dubois  [facility] Pager [phone]. If 7PM-7AM, please contact night-coverage at [email or web address], password [facility] 04/30/2015, 2:55 PM  LOS: 2 days

## 2015-04-30 NOTE — Progress Notes (Signed)
Patient Name: [name]      SUBJECTIVE: Old  LBBB and admitted with sinus arrest and RBBB escape Subsequently with 2:1 AVB And othewise evidence of MBZ2 AVBlcok Hx of PAF on amiodarone started 12/14 ECG of afiob prior to this presentation>>HR 106    Last echo 10/2013 with EF 55-60% mild LVH mild MR Lt atrium was severely dilated. PA pk pressure 47. She was in a fib for this study. Last nuc 2014 neg for ischemia.   6/16 ECG >>SR with LBBB 6/17 ECG >> Junctional vs Ventricular escape ( prob latter) with sinus arrest  Tel overnight  No more pauses  No cp or sob\  BP better but still high   Past Medical History  Diagnosis Date  . Hyperlipidemia   . Hypothyroidism   . Arthritis   . Anemia   . Asthma   . Anxiety   . IBS (irritable bowel syndrome)   . Gout   . Colon polyps   . Tubulovillous adenoma 4/07  . Hemorrhoids   . Diverticulosis   . Chronic diarrhea   . Chronic kidney disease, stage IV (severe)   . Hypertensive heart disease     no significant RAS by MRA 2002- (<30% LRAS)   . Paroxysmal atrial fibrillation 11/01/2013    Intermittent through the years and recurrent in December of 2014 treated with amiodarone;; Negative Myoview 10/2013  . Peripheral vascular disease     123456 LICA, [id number] RICA by doppler 2009   . Benign positional vertigo   . LBBB (left bundle branch block)- new 11/01/13 11/01/2013  . Diastolic dysfunction, left ventricle 11/03/2013  . Syncope and collapse 04/28/2015  . Sinus arrest 04/28/2015  . LBBB (left bundle branch block)     Scheduled Meds:  Scheduled Meds: . amLODipine  5 mg Oral Daily  . [START ON 05/01/2015] chlorhexidine  60 mL Topical Once  . chlorhexidine  60 mL Topical Once  . [START ON 05/01/2015] gentamicin irrigation  80 mg Irrigation To Cath  . hydrALAZINE  100 mg Oral 3 times per day  . isosorbide mononitrate  60 mg Oral Daily  . levothyroxine  88 mcg Oral QAC breakfast  . losartan  50 mg Oral  Daily  . pantoprazole  40 mg Oral Daily  . sodium chloride  3 mL Intravenous Q12H  . sodium chloride  3 mL Intravenous Q12H  . [START ON 05/01/2015] vancomycin  1,000 mg Intravenous To SS-Surg  . [START ON 05/01/2015] vancomycin  1,000 mg Intravenous To Cath   Continuous Infusions: . [START ON 05/01/2015] sodium chloride    . [START ON 05/01/2015] sodium chloride     sodium chloride, acetaminophen **OR** acetaminophen, hydrALAZINE, hyoscyamine, LORazepam, morphine injection, ondansetron **OR** ondansetron (ZOFRAN) IV, oxyCODONE, sodium chloride, temazepam    PHYSICAL EXAM Filed Vitals:   04/30/15 0400 04/30/15 0414 04/30/15 0500 04/30/15 0600  BP: 113/31  142/32 165/38  Pulse: 52  52 54  Temp:  97.9 F (36.6 C)    TempSrc:  Oral    Resp: 23  16 16   Height:      Weight:   136 lb 11 oz (62 kg)   SpO2: 100%  100% 98%    Well developed and nourished in no acute distress HENT normal Neck supple  Clear Regular rate and rhythm, 2/6 m Abd-soft with active BS No Clubbing cyanosis edema Skin-warm and dry A & Oriented  Grossly normal sensory and motor function  TELEMETRY: Reviewed  telemetry pt in *As above    Intake/Output Summary (Last 24 hours) at 04/30/15 0749 Last data filed at 04/30/15 0600  Gross per 24 hour  Intake    920 ml  Output   2200 ml  Net  -1280 ml    LABS: Basic Metabolic Panel:  Recent Labs Lab 04/28/15 1542 04/28/15 2123 04/30/15 0305  NA 143  --  137  K 4.6  --  4.1  CL 103  --  100*  CO2 25  --  30  GLUCOSE 113*  --  101*  BUN 60*  --  48*  CREATININE 2.37*  --  2.25*  CALCIUM 8.5*  --  8.2*  MG  --  1.7  --   PHOS  --  4.1  --    Cardiac Enzymes:  Recent Labs  04/28/15 1542 04/30/15 0305  TROPONINI 0.04* 0.05*   CBC:  Recent Labs Lab 04/28/15 1542 04/30/15 0305  WBC 5.9 4.3  NEUTROABS 4.0  --   HGB 9.1* 7.1*  HCT 27.9* 21.4*  MCV 85.8 85.3  PLT 199 134*   PROTIME:  Recent Labs  04/28/15 2123  LABPROT 15.1  INR 1.17    Liver Function Tests:  Recent Labs  04/28/15 1542  AST 31  ALT 32  ALKPHOS 69  BILITOT 0.8  PROT 6.2*  ALBUMIN 3.5   No results for input(s): LIPASE, AMYLASE in the last 72 hours. BNP: BNP (last 3 results) No results for input(s): BNP in the last 8760 hours.  ProBNP (last 3 results) No results for input(s): PROBNP in the last 8760 hours.  D-Dimer: No results for input(s): DDIMER in the last 72 hours. Hemoglobin A1C: No results for input(s): HGBA1C in the last 72 hours. Fasting Lipid Panel: No results for input(s): CHOL, HDL, LDLCALC, TRIG, CHOLHDL, LDLDIRECT in the last 72 hours. Thyroid Function Tests:  Recent Labs  04/28/15 2123  TSH 1.548   Anemia Panel: No results for input(s): VITAMINB12, FOLATE, FERRITIN, TIBC, IRON, RETICCTPCT in the last 72 hours.     ASSESSMENT AND PLAN:  Principal Problem:   Syncope and collapse Active Problems:   Hypothyroidism   Hypertensive heart disease   GERD (gastroesophageal reflux disease)   LBBB (left bundle branch block)- new 11/01/13   Hypertensive heart/kidney disease w/chronic kidney disease stage IV   Systolic hypertension, isolated; difficult to control   Symptomatic bradycardia   Right bundle branch block (RBBB) intermittent    Fracture, femur, lt   Laceration of scalp  Will stop losartan Increase hydralazine and amlodpiine Orders written fro pacer tomorrow   Significant anemia   Will transfuse one unit and defer 2nd unit to ortho with insight as to anticipated blood loss  Signed, Virl Axe MD  04/30/2015

## 2015-04-30 NOTE — Progress Notes (Addendum)
Subjective: L hip fx Awaiting OR tomorrow for pacer and L hip hemi vs. THA anterior approach. Pain well controlled Awaiting transfusion this AM  Objective: Vital signs in last 24 hours: Temp:  [97.8 F (36.6 C)-97.9 F (36.6 C)] 97.9 F (36.6 C) (06/19 0700) Pulse Rate:  [52-70] 58 (06/19 0900) Resp:  [11-23] 13 (06/19 0900) BP: (113-230)/(23-154) 146/49 mmHg (06/19 0900) SpO2:  [94 %-100 %] 100 % (06/19 0900) Weight:  [62 kg (136 lb 11 oz)] 62 kg (136 lb 11 oz) (06/19 0500)  Intake/Output from previous day: 06/18 0701 - 06/19 0700 In: 920 [P.O.:920] Out: 2200 [Urine:2200] Intake/Output this shift:     Recent Labs  04/28/15 1542 04/30/15 0305  HGB 9.1* 7.1*    Recent Labs  04/28/15 1542 04/30/15 0305  WBC 5.9 4.3  RBC 3.25* 2.51*  HCT 27.9* 21.4*  PLT 199 134*    Recent Labs  04/28/15 1542 04/30/15 0305  NA 143 137  K 4.6 4.1  CL 103 100*  CO2 25 30  BUN 60* 48*  CREATININE 2.37* 2.25*  GLUCOSE 113* 101*  CALCIUM 8.5* 8.2*    Recent Labs  04/28/15 2123  INR 1.17    Neurologically intact ABD soft Neurovascular intact Sensation intact distally Intact pulses distally Dorsiflexion/Plantar flexion intact No cellulitis present Compartment soft no sign of DVT  Assessment/Plan: L hip fx  Plan NPO after MN for OR tomorrow for L hip hemi vs. THA anterior approach Hgb 7.1 this AM- transfusion ordered by cardiology, anticipate she will need both units, post-transfusion labs ordered Discussed recovery post-op Discussed with Dr. Lyla Glassing and Dr. Tonita Cong this AM  Lacie Draft M. 04/30/2015, 9:32 AM

## 2015-05-01 ENCOUNTER — Inpatient Hospital Stay (HOSPITAL_COMMUNITY): Payer: Medicare Other | Admitting: Anesthesiology

## 2015-05-01 ENCOUNTER — Inpatient Hospital Stay (HOSPITAL_COMMUNITY): Payer: Medicare Other

## 2015-05-01 ENCOUNTER — Encounter (HOSPITAL_COMMUNITY)
Admission: EM | Disposition: A | Discharge status: Skilled Nursing Facility | Payer: Self-pay | Source: Home / Self Care | Attending: Internal Medicine

## 2015-05-01 ENCOUNTER — Encounter (HOSPITAL_COMMUNITY)
Admission: EM | Disposition: A | Discharge status: Skilled Nursing Facility | Payer: Medicare Other | Source: Home / Self Care | Attending: Internal Medicine

## 2015-05-01 ENCOUNTER — Encounter (HOSPITAL_COMMUNITY): Payer: Self-pay | Admitting: Internal Medicine

## 2015-05-01 DIAGNOSIS — I442 Atrioventricular block, complete: Secondary | ICD-10-CM

## 2015-05-01 DIAGNOSIS — I509 Heart failure, unspecified: Secondary | ICD-10-CM

## 2015-05-01 DIAGNOSIS — I110 Hypertensive heart disease with heart failure: Secondary | ICD-10-CM

## 2015-05-01 DIAGNOSIS — Z950 Presence of cardiac pacemaker: Secondary | ICD-10-CM

## 2015-05-01 HISTORY — DX: Presence of cardiac pacemaker: Z95.0

## 2015-05-01 HISTORY — PX: EP IMPLANTABLE DEVICE: SHX172B

## 2015-05-01 HISTORY — PX: HIP ARTHROPLASTY: SHX981

## 2015-05-01 LAB — CBC
HEMATOCRIT: 31.2 % — AB (ref 36.0–46.0)
Hemoglobin: 10.6 g/dL — ABNORMAL LOW (ref 12.0–15.0)
MCH: 28.6 pg (ref 26.0–34.0)
MCHC: 34 g/dL (ref 30.0–36.0)
MCV: 84.1 fL (ref 78.0–100.0)
PLATELETS: 143 10*3/uL — AB (ref 150–400)
RBC: 3.71 MIL/uL — ABNORMAL LOW (ref 3.87–5.11)
RDW: 16.3 % — AB (ref 11.5–15.5)
WBC: 6.9 10*3/uL (ref 4.0–10.5)

## 2015-05-01 LAB — TYPE AND SCREEN
ABO/RH(D): AB POS
Antibody Screen: NEGATIVE
UNIT DIVISION: 0
UNIT DIVISION: 0

## 2015-05-01 LAB — BASIC METABOLIC PANEL
ANION GAP: 9 (ref 5–15)
BUN: 45 mg/dL — AB (ref 6–20)
CALCIUM: 8 mg/dL — AB (ref 8.9–10.3)
CHLORIDE: 97 mmol/L — AB (ref 101–111)
CO2: 30 mmol/L (ref 22–32)
CREATININE: 2.41 mg/dL — AB (ref 0.44–1.00)
GFR calc Af Amer: 20 mL/min — ABNORMAL LOW (ref >60)
GFR calc non Af Amer: 17 mL/min — ABNORMAL LOW (ref >60)
Glucose, Bld: 109 mg/dL — ABNORMAL HIGH (ref 65–99)
Potassium: 4.1 mmol/L (ref 3.5–5.1)
SODIUM: 136 mmol/L (ref 135–145)

## 2015-05-01 SURGERY — PACEMAKER IMPLANT

## 2015-05-01 SURGERY — HEMIARTHROPLASTY, HIP, DIRECT ANTERIOR APPROACH, FOR FRACTURE
Anesthesia: General | Site: Hip | Laterality: Left

## 2015-05-01 MED ORDER — LIDOCAINE HCL (PF) 1 % IJ SOLN
INTRAMUSCULAR | Status: AC
Start: 2015-05-01 — End: 2015-05-01
  Filled 2015-05-01: qty 30

## 2015-05-01 MED ORDER — VANCOMYCIN HCL 1000 MG IV SOLR
1000.0000 mg | INTRAVENOUS | Status: DC | PRN
  Administered 2015-05-01: 1000 mg via INTRAVENOUS

## 2015-05-01 MED ORDER — SODIUM CHLORIDE 0.9 % IV SOLN: INTRAVENOUS | Status: DC

## 2015-05-01 MED ORDER — MENTHOL 3 MG MT LOZG: 1.0000 | LOZENGE | OROMUCOSAL | Status: DC | PRN

## 2015-05-01 MED ORDER — LABETALOL HCL 5 MG/ML IV SOLN
INTRAVENOUS | Status: AC
  Filled 2015-05-01: qty 4

## 2015-05-01 MED ORDER — ONDANSETRON HCL 4 MG PO TABS: 4.0000 mg | ORAL_TABLET | Freq: Four times a day (QID) | ORAL | Status: DC | PRN

## 2015-05-01 MED ORDER — PROPOFOL 10 MG/ML IV BOLUS
INTRAVENOUS | Status: DC | PRN
  Administered 2015-05-01: 50 mg via INTRAVENOUS

## 2015-05-01 MED ORDER — ACETAMINOPHEN 325 MG PO TABS: 650.0000 mg | ORAL_TABLET | Freq: Four times a day (QID) | ORAL | Status: DC | PRN

## 2015-05-01 MED ORDER — VANCOMYCIN HCL IN DEXTROSE 1-5 GM/200ML-% IV SOLN
1000.0000 mg | Freq: Two times a day (BID) | INTRAVENOUS | Status: DC
Start: 2015-05-01 — End: 2015-05-01
  Administered 2015-05-01: 1000 mg via INTRAVENOUS

## 2015-05-01 MED ORDER — HYDROCODONE-ACETAMINOPHEN 5-325 MG PO TABS
1.0000 | ORAL_TABLET | Freq: Four times a day (QID) | ORAL | Status: DC | PRN
  Administered 2015-05-03: 1 via ORAL
  Filled 2015-05-01 (×2): qty 1

## 2015-05-01 MED ORDER — ONDANSETRON HCL 4 MG/2ML IJ SOLN: 4.0000 mg | Freq: Four times a day (QID) | INTRAMUSCULAR | Status: DC | PRN

## 2015-05-01 MED ORDER — POVIDONE-IODINE 7.5 % EX SOLN
CUTANEOUS | Status: DC | PRN
  Administered 2015-05-01: 1 via TOPICAL

## 2015-05-01 MED ORDER — MIDAZOLAM HCL 5 MG/5ML IJ SOLN
INTRAMUSCULAR | Status: AC
  Filled 2015-05-01: qty 5

## 2015-05-01 MED ORDER — SODIUM CHLORIDE 0.9 % IJ SOLN
INTRAMUSCULAR | Status: AC
  Filled 2015-05-01: qty 20

## 2015-05-01 MED ORDER — LACTATED RINGERS IV SOLN
INTRAVENOUS | Status: DC | PRN
  Administered 2015-05-01 (×2): via INTRAVENOUS

## 2015-05-01 MED ORDER — GENTAMICIN SULFATE 40 MG/ML IJ SOLN
INTRAMUSCULAR | Status: AC
  Filled 2015-05-01: qty 2

## 2015-05-01 MED ORDER — EPHEDRINE SULFATE 50 MG/ML IJ SOLN
INTRAMUSCULAR | Status: DC | PRN
Start: 2015-05-01 — End: 2015-05-01
  Administered 2015-05-01 (×3): 5 mg via INTRAVENOUS

## 2015-05-01 MED ORDER — VANCOMYCIN HCL IN DEXTROSE 1-5 GM/200ML-% IV SOLN
1000.0000 mg | Freq: Two times a day (BID) | INTRAVENOUS | Status: AC
  Administered 2015-05-01: 1000 mg via INTRAVENOUS
  Filled 2015-05-01: qty 200

## 2015-05-01 MED ORDER — ACETAMINOPHEN 650 MG RE SUPP: 650.0000 mg | Freq: Four times a day (QID) | RECTAL | Status: DC | PRN

## 2015-05-01 MED ORDER — PHENYLEPHRINE 40 MCG/ML (10ML) SYRINGE FOR IV PUSH (FOR BLOOD PRESSURE SUPPORT)
PREFILLED_SYRINGE | INTRAVENOUS | Status: AC
  Filled 2015-05-01: qty 10

## 2015-05-01 MED ORDER — LIDOCAINE HCL (PF) 1 % IJ SOLN
INTRAMUSCULAR | Status: AC
  Filled 2015-05-01: qty 30

## 2015-05-01 MED ORDER — FENTANYL CITRATE (PF) 100 MCG/2ML IJ SOLN
INTRAMUSCULAR | Status: AC
  Administered 2015-05-01: 50 ug via INTRAVENOUS
  Filled 2015-05-01: qty 2

## 2015-05-01 MED ORDER — PROPOFOL 10 MG/ML IV BOLUS
INTRAVENOUS | Status: AC
  Filled 2015-05-01: qty 20

## 2015-05-01 MED ORDER — LIDOCAINE HCL (PF) 1 % IJ SOLN
INTRAMUSCULAR | Status: DC | PRN
  Administered 2015-05-01: 11 mL
  Administered 2015-05-01: 30 mL

## 2015-05-01 MED ORDER — APIXABAN 2.5 MG PO TABS
2.5000 mg | ORAL_TABLET | Freq: Two times a day (BID) | ORAL | Status: DC
  Administered 2015-05-02 – 2015-05-03 (×3): 2.5 mg via ORAL
  Filled 2015-05-01 (×4): qty 1

## 2015-05-01 MED ORDER — ROCURONIUM BROMIDE 100 MG/10ML IV SOLN
INTRAVENOUS | Status: DC | PRN
  Administered 2015-05-01: 10 mg via INTRAVENOUS
  Administered 2015-05-01: 40 mg via INTRAVENOUS

## 2015-05-01 MED ORDER — ONDANSETRON HCL 4 MG/2ML IJ SOLN
4.0000 mg | Freq: Four times a day (QID) | INTRAMUSCULAR | Status: DC | PRN
  Administered 2015-05-01: 4 mg via INTRAVENOUS
  Filled 2015-05-01: qty 2

## 2015-05-01 MED ORDER — 0.9 % SODIUM CHLORIDE (POUR BTL) OPTIME
TOPICAL | Status: DC | PRN
  Administered 2015-05-01: 1000 mL

## 2015-05-01 MED ORDER — BUPIVACAINE-EPINEPHRINE (PF) 0.5% -1:200000 IJ SOLN
INTRAMUSCULAR | Status: DC | PRN
  Administered 2015-05-01: 30 mL

## 2015-05-01 MED ORDER — YOU HAVE A PACEMAKER BOOK
Freq: Once | Status: DC
  Filled 2015-05-01: qty 1

## 2015-05-01 MED ORDER — FENTANYL CITRATE (PF) 100 MCG/2ML IJ SOLN
25.0000 ug | INTRAMUSCULAR | Status: DC | PRN
  Administered 2015-05-01 (×2): 50 ug via INTRAVENOUS

## 2015-05-01 MED ORDER — MORPHINE SULFATE 2 MG/ML IJ SOLN: 0.5000 mg | INTRAMUSCULAR | Status: DC | PRN

## 2015-05-01 MED ORDER — NEOSTIGMINE METHYLSULFATE 10 MG/10ML IV SOLN
INTRAVENOUS | Status: DC | PRN
  Administered 2015-05-01: 2 mg via INTRAVENOUS

## 2015-05-01 MED ORDER — METOCLOPRAMIDE HCL 5 MG PO TABS
5.0000 mg | ORAL_TABLET | Freq: Three times a day (TID) | ORAL | Status: DC | PRN
  Filled 2015-05-01: qty 2

## 2015-05-01 MED ORDER — MIDAZOLAM HCL 5 MG/5ML IJ SOLN
INTRAMUSCULAR | Status: DC | PRN
  Administered 2015-05-01 (×2): 1 mg via INTRAVENOUS

## 2015-05-01 MED ORDER — DOCUSATE SODIUM 100 MG PO CAPS
100.0000 mg | ORAL_CAPSULE | Freq: Two times a day (BID) | ORAL | Status: DC
  Administered 2015-05-01 – 2015-05-03 (×4): 100 mg via ORAL
  Filled 2015-05-01 (×6): qty 1

## 2015-05-01 MED ORDER — LIDOCAINE HCL (CARDIAC) 20 MG/ML IV SOLN
INTRAVENOUS | Status: AC
  Filled 2015-05-01: qty 10

## 2015-05-01 MED ORDER — KETOROLAC TROMETHAMINE 30 MG/ML IJ SOLN
INTRAMUSCULAR | Status: AC
  Filled 2015-05-01: qty 1

## 2015-05-01 MED ORDER — METOCLOPRAMIDE HCL 5 MG/ML IJ SOLN
5.0000 mg | Freq: Three times a day (TID) | INTRAMUSCULAR | Status: DC | PRN
  Filled 2015-05-01: qty 2

## 2015-05-01 MED ORDER — PHENOL 1.4 % MT LIQD: 1.0000 | OROMUCOSAL | Status: DC | PRN

## 2015-05-01 MED ORDER — KETOROLAC TROMETHAMINE 30 MG/ML IJ SOLN
INTRAMUSCULAR | Status: DC | PRN
  Administered 2015-05-01: 30 mg via INTRAVENOUS

## 2015-05-01 MED ORDER — FENTANYL CITRATE (PF) 100 MCG/2ML IJ SOLN
INTRAMUSCULAR | Status: DC | PRN
  Administered 2015-05-01 (×5): 50 ug via INTRAVENOUS

## 2015-05-01 MED ORDER — SENNA 8.6 MG PO TABS
1.0000 | ORAL_TABLET | Freq: Two times a day (BID) | ORAL | Status: DC
  Administered 2015-05-01 – 2015-05-03 (×4): 8.6 mg via ORAL
  Filled 2015-05-01 (×6): qty 1

## 2015-05-01 MED ORDER — FENTANYL CITRATE (PF) 100 MCG/2ML IJ SOLN
INTRAMUSCULAR | Status: DC | PRN
  Administered 2015-05-01: 12.5 ug via INTRAVENOUS

## 2015-05-01 MED ORDER — DEXAMETHASONE SODIUM PHOSPHATE 10 MG/ML IJ SOLN
INTRAMUSCULAR | Status: DC | PRN
  Administered 2015-05-01: 8 mg via INTRAVENOUS

## 2015-05-01 MED ORDER — SODIUM CHLORIDE 0.9 % IV SOLN
INTRAVENOUS | Status: AC
  Administered 2015-05-01: 22:00:00 via INTRAVENOUS

## 2015-05-01 MED ORDER — SODIUM CHLORIDE 0.9 % IR SOLN
Status: DC | PRN
  Administered 2015-05-01: 1000 mL

## 2015-05-01 MED ORDER — BUPIVACAINE-EPINEPHRINE (PF) 0.5% -1:200000 IJ SOLN
30.0000 mL | Freq: Once | INTRAMUSCULAR | Status: DC
  Filled 2015-05-01: qty 30

## 2015-05-01 MED ORDER — EPHEDRINE SULFATE 50 MG/ML IJ SOLN
INTRAMUSCULAR | Status: AC
  Filled 2015-05-01: qty 2

## 2015-05-01 MED ORDER — LIDOCAINE HCL (CARDIAC) 20 MG/ML IV SOLN
INTRAVENOUS | Status: DC | PRN
  Administered 2015-05-01: 40 mg via INTRAVENOUS

## 2015-05-01 MED ORDER — PHENYLEPHRINE HCL 10 MG/ML IJ SOLN
INTRAMUSCULAR | Status: DC | PRN
  Administered 2015-05-01: 40 ug via INTRAVENOUS
  Administered 2015-05-01 (×2): 80 ug via INTRAVENOUS
  Administered 2015-05-01: 40 ug via INTRAVENOUS
  Administered 2015-05-01: 80 ug via INTRAVENOUS
  Administered 2015-05-01 (×2): 40 ug via INTRAVENOUS

## 2015-05-01 MED ORDER — HEPARIN (PORCINE) IN NACL 2-0.9 UNIT/ML-% IJ SOLN
INTRAMUSCULAR | Status: AC
  Filled 2015-05-01: qty 500

## 2015-05-01 MED ORDER — FENTANYL CITRATE (PF) 100 MCG/2ML IJ SOLN
INTRAMUSCULAR | Status: AC
  Filled 2015-05-01: qty 2

## 2015-05-01 MED ORDER — GLYCOPYRROLATE 0.2 MG/ML IJ SOLN
INTRAMUSCULAR | Status: DC | PRN
  Administered 2015-05-01: 0.4 mg via INTRAVENOUS

## 2015-05-01 MED ORDER — SODIUM CHLORIDE 0.9 % IV SOLN: INTRAVENOUS | Status: AC

## 2015-05-01 MED ORDER — FENTANYL CITRATE (PF) 250 MCG/5ML IJ SOLN
INTRAMUSCULAR | Status: AC
  Filled 2015-05-01: qty 5

## 2015-05-01 MED ORDER — ACETAMINOPHEN 325 MG PO TABS: 325.0000 mg | ORAL_TABLET | ORAL | Status: DC | PRN

## 2015-05-01 MED ORDER — SODIUM CHLORIDE 0.9 % IJ SOLN
INTRAMUSCULAR | Status: DC | PRN
  Administered 2015-05-01: 30 mL

## 2015-05-01 SURGICAL SUPPLY — 51 items
ADH SKN CLS APL DERMABOND .7 (GAUZE/BANDAGES/DRESSINGS) ×1
BLADE SAW SGTL 18X1.27X75 (BLADE) ×2 IMPLANT
BLADE SURG ROTATE 9660 (MISCELLANEOUS) IMPLANT
CAPT HIP TOTAL 2 ×1 IMPLANT
CHLORAPREP W/TINT 26ML (MISCELLANEOUS) ×2 IMPLANT
COVER SURGICAL LIGHT HANDLE (MISCELLANEOUS) ×2 IMPLANT
DERMABOND ADVANCED (GAUZE/BANDAGES/DRESSINGS) ×1
DERMABOND ADVANCED .7 DNX12 (GAUZE/BANDAGES/DRESSINGS) ×2 IMPLANT
DRAPE C-ARM 42X72 X-RAY (DRAPES) ×2 IMPLANT
DRAPE IMP U-DRAPE 54X76 (DRAPES) ×4 IMPLANT
DRAPE STERI IOBAN 125X83 (DRAPES) ×2 IMPLANT
DRAPE U-SHAPE 47X51 STRL (DRAPES) ×6 IMPLANT
DRSG AQUACEL AG ADV 3.5X10 (GAUZE/BANDAGES/DRESSINGS) ×2 IMPLANT
ELECT BLADE 4.0 EZ CLEAN MEGAD (MISCELLANEOUS) ×2
ELECT REM PT RETURN 9FT ADLT (ELECTROSURGICAL) ×2
ELECTRODE BLDE 4.0 EZ CLN MEGD (MISCELLANEOUS) ×1 IMPLANT
ELECTRODE REM PT RTRN 9FT ADLT (ELECTROSURGICAL) ×1 IMPLANT
EVACUATOR 1/8 PVC DRAIN (DRAIN) IMPLANT
GLOVE BIO SURGEON STRL SZ8.5 (GLOVE) ×4 IMPLANT
GLOVE BIOGEL PI IND STRL 8.5 (GLOVE) ×1 IMPLANT
GLOVE BIOGEL PI INDICATOR 8.5 (GLOVE) ×1
GOWN STRL REUS W/ TWL LRG LVL3 (GOWN DISPOSABLE) ×2 IMPLANT
GOWN STRL REUS W/TWL 2XL LVL3 (GOWN DISPOSABLE) ×2 IMPLANT
GOWN STRL REUS W/TWL LRG LVL3 (GOWN DISPOSABLE) ×4
HANDPIECE INTERPULSE COAX TIP (DISPOSABLE) ×2
HOOD PEEL AWAY FACE SHEILD DIS (HOOD) ×4 IMPLANT
KIT BASIN OR (CUSTOM PROCEDURE TRAY) ×2 IMPLANT
KIT ROOM TURNOVER OR (KITS) ×2 IMPLANT
MANIFOLD NEPTUNE II (INSTRUMENTS) ×2 IMPLANT
MARKER SKIN DUAL TIP RULER LAB (MISCELLANEOUS) ×2 IMPLANT
NEEDLE SPNL 18GX3.5 QUINCKE PK (NEEDLE) ×2 IMPLANT
NS IRRIG 1000ML POUR BTL (IV SOLUTION) ×2 IMPLANT
PACK TOTAL JOINT (CUSTOM PROCEDURE TRAY) ×2 IMPLANT
PACK UNIVERSAL I (CUSTOM PROCEDURE TRAY) ×2 IMPLANT
PAD ARMBOARD 7.5X6 YLW CONV (MISCELLANEOUS) ×4 IMPLANT
SEALER BIPOLAR AQUA 6.0 (INSTRUMENTS) IMPLANT
SET HNDPC FAN SPRY TIP SCT (DISPOSABLE) ×1 IMPLANT
SUCTION FRAZIER TIP 10 FR DISP (SUCTIONS) ×2 IMPLANT
SUT ETHIBOND NAB CT1 #1 30IN (SUTURE) ×4 IMPLANT
SUT MNCRL AB 3-0 PS2 18 (SUTURE) ×2 IMPLANT
SUT MON AB 2-0 CT1 36 (SUTURE) ×2 IMPLANT
SUT VIC AB 1 CT1 27 (SUTURE) ×2
SUT VIC AB 1 CT1 27XBRD ANBCTR (SUTURE) ×1 IMPLANT
SUT VIC AB 2-0 CT1 27 (SUTURE) ×2
SUT VIC AB 2-0 CT1 TAPERPNT 27 (SUTURE) ×1 IMPLANT
SUT VLOC 180 0 24IN GS25 (SUTURE) ×2 IMPLANT
SYR 50ML LL SCALE MARK (SYRINGE) ×2 IMPLANT
TOWEL OR 17X24 6PK STRL BLUE (TOWEL DISPOSABLE) ×2 IMPLANT
TOWEL OR 17X26 10 PK STRL BLUE (TOWEL DISPOSABLE) ×2 IMPLANT
TRAY FOLEY CATH 16FR SILVER (SET/KITS/TRAYS/PACK) IMPLANT
WATER STERILE IRR 1000ML POUR (IV SOLUTION) ×6 IMPLANT

## 2015-05-01 SURGICAL SUPPLY — 7 items
CABLE SURGICAL S-101-97-12 (CABLE) ×1 IMPLANT
LEAD CAPSURE NOVUS 5076-52CM (Lead) ×1 IMPLANT
LEAD CAPSURE NOVUS 5076-58CM (Lead) ×1 IMPLANT
PAD DEFIB LIFELINK (PAD) ×1 IMPLANT
PPM ADVISA MRI DR A2DR01 (Pacemaker) ×2 IMPLANT
SHEATH CLASSIC 7F (SHEATH) ×2 IMPLANT
TRAY PACEMAKER INSERTION (CUSTOM PROCEDURE TRAY) ×1 IMPLANT

## 2015-05-01 NOTE — Interval H&P Note (Signed)
History and Physical Interval Note:  05/01/2015 4:32 PM  [name]  has presented today for surgery, with the diagnosis of Left femoral neck fracture  The various methods of treatment have been discussed with the patient and family. After consideration of risks, benefits and other options for treatment, the patient has consented to  Procedure(s): ARTHROPLASTY BIPOLAR HIP (HEMIARTHROPLASTY) (Left) as a surgical intervention .  The patient's history has been reviewed, patient examined, no change in status, stable for surgery.  I have reviewed the patient's chart and labs.  Questions were answered to the patient's satisfaction.    The family reiterated that she is extremely active, lives independently, and works out at the [employer] / week. Prefers THA.   [place], Horald Pollen

## 2015-05-01 NOTE — Progress Notes (Signed)
Patient Name: [name]      SUBJECTIVE: Old  LBBB and admitted with sinus arrest and RBBB escape Subsequently with 2:1 AVB And othewise evidence of MBZ2 AVBlcok Hx of PAF on amiodarone started 12/14 ECG of afib prior to this presentation>>HR 106  Last echo 10/2013 with EF 55-60% mild LVH mild MR Lt atrium was severely dilated. PA pk pressure 47. She was in a fib for this study. Last nuc 2014 neg for ischemia.   6/16 ECG >>SR with LBBB 6/17 ECG >> Junctional vs Ventricular escape ( prob latter) with sinus arrest  Tel overnight  No more pauses  No cp or sob\  BP better but still high   Past Medical History  Diagnosis Date  . Hyperlipidemia   . Hypothyroidism   . Arthritis   . Anemia   . Asthma   . Anxiety   . IBS (irritable bowel syndrome)   . Gout   . Colon polyps   . Tubulovillous adenoma 4/07  . Hemorrhoids   . Diverticulosis   . Chronic diarrhea   . Chronic kidney disease, stage IV (severe)   . Hypertensive heart disease     no significant RAS by MRA 2002- (<30% LRAS)   . Paroxysmal atrial fibrillation 11/01/2013    Intermittent through the years and recurrent in December of 2014 treated with amiodarone;; Negative Myoview 10/2013  . Peripheral vascular disease     123456 LICA, [id number] RICA by doppler 2009   . Benign positional vertigo   . LBBB (left bundle branch block)- new 11/01/13 11/01/2013  . Diastolic dysfunction, left ventricle 11/03/2013  . Syncope and collapse 04/28/2015  . Sinus arrest 04/28/2015  . LBBB (left bundle branch block)     Scheduled Meds:  Scheduled Meds: . sodium chloride   Intravenous Once  . amLODipine  5 mg Oral Daily  . gentamicin irrigation  80 mg Irrigation To Cath  . hydrALAZINE  150 mg Oral 3 times per day  . isosorbide mononitrate  60 mg Oral Daily  . levothyroxine  88 mcg Oral QAC breakfast  . pantoprazole  40 mg Oral Daily  . sodium chloride  3 mL Intravenous Q12H  . sodium chloride  3 mL  Intravenous Q12H  . vancomycin  1,000 mg Intravenous To SS-Surg  . vancomycin  1,000 mg Intravenous To Cath   Continuous Infusions: . sodium chloride 50 mL/hr at 05/01/15 0628  . sodium chloride 50 mL/hr at 05/01/15 [id number]   sodium chloride, acetaminophen **OR** acetaminophen, hydrALAZINE, hyoscyamine, LORazepam, morphine injection, ondansetron **OR** ondansetron (ZOFRAN) IV, oxyCODONE, sodium chloride, temazepam    PHYSICAL EXAM Filed Vitals:   05/01/15 0400 05/01/15 0500 05/01/15 0600 05/01/15 0749  BP: 143/32 185/43 164/86 201/52  Pulse: 55 57 51   Temp: 98.7 F (37.1 C)   98.8 F (37.1 C)  TempSrc: Oral   Oral  Resp: 16 14 17 17   Height:      Weight:  131 lb 6.3 oz (59.6 kg)    SpO2: 94% 94% 94% 95%    Well developed and nourished in no acute distress HENT normal Neck supple  Clear Regular rate and rhythm, 2/6 m Abd-soft with active BS No Clubbing cyanosis edema Skin-warm and dry A & Oriented  Grossly normal sensory and motor function  TELEMETRY: Reviewed telemetry pt in *As above    Intake/Output Summary (Last 24 hours) at 05/01/15 0809 Last data filed at 05/01/15 0700  Gross per 24 hour  Intake 1961.34 ml  Output   1325 ml  Net 636.34 ml    LABS: Basic Metabolic Panel:  Recent Labs Lab 04/28/15 1542 04/28/15 2123 04/30/15 0305 05/01/15 0021  NA 143  --  137 136  K 4.6  --  4.1 4.1  CL 103  --  100* 97*  CO2 25  --  30 30  GLUCOSE 113*  --  101* 109*  BUN 60*  --  48* 45*  CREATININE 2.37*  --  2.25* 2.41*  CALCIUM 8.5*  --  8.2* 8.0*  MG  --  1.7  --   --   PHOS  --  4.1  --   --    Cardiac Enzymes:  Recent Labs  04/28/15 1542 04/30/15 0305  TROPONINI 0.04* 0.05*   CBC:  Recent Labs Lab 04/28/15 1542 04/30/15 0305 05/01/15 0021  WBC 5.9 4.3 6.9  NEUTROABS 4.0  --   --   HGB 9.1* 7.1* 10.6*  HCT 27.9* 21.4* 31.2*  MCV 85.8 85.3 84.1  PLT 199 134* 143*   PROTIME:  Recent Labs  04/28/15 2123  LABPROT 15.1  INR 1.17    Liver Function Tests:  Recent Labs  04/28/15 1542  AST 31  ALT 32  ALKPHOS 69  BILITOT 0.8  PROT 6.2*  ALBUMIN 3.5   No results for input(s): LIPASE, AMYLASE in the last 72 hours. BNP: BNP (last 3 results) No results for input(s): BNP in the last 8760 hours.  ProBNP (last 3 results) No results for input(s): PROBNP in the last 8760 hours.  D-Dimer: No results for input(s): DDIMER in the last 72 hours. Hemoglobin A1C: No results for input(s): HGBA1C in the last 72 hours. Fasting Lipid Panel: No results for input(s): CHOL, HDL, LDLCALC, TRIG, CHOLHDL, LDLDIRECT in the last 72 hours. Thyroid Function Tests:  Recent Labs  04/28/15 2123  TSH 1.548   Anemia Panel: No results for input(s): VITAMINB12, FOLATE, FERRITIN, TIBC, IRON, RETICCTPCT in the last 72 hours.     ASSESSMENT AND PLAN:  Principal Problem:   Syncope and collapse Active Problems:   Hypothyroidism   Hypertensive heart disease   GERD (gastroesophageal reflux disease)   LBBB (left bundle branch block)- new 11/01/13   Hypertensive heart/kidney disease w/chronic kidney disease stage IV   Systolic hypertension, isolated; difficult to control   Symptomatic bradycardia   Right bundle branch block (RBBB) intermittent    Fracture, femur, lt   Laceration of scalp  Will stop losartan but Cr bump may take a few days to improve Increase hydralazine and amlodpiine  Will begin labetolol for BP post pacing  Transfused  Hgb 10 + Signed, Virl Axe MD  05/01/2015

## 2015-05-01 NOTE — Care Management Note (Signed)
Case Management Note  Patient Details  Name: [name] MRN: [id number] Date of Birth: 1924-07-18  Subjective/Objective:      Adm w bradycardia, syncope, hip fx              Action/Plan: lives alone   Expected Discharge Date:                  Expected Discharge Plan:     In-House Referral:     Discharge planning Services     Post Acute Care Choice:    Choice offered to:     DME Arranged:    DME Agency:     HH Arranged:    [place] Agency:     Status of Service:     Medicare Important Message Given:    Date Medicare IM Given:    Medicare IM give by:    Date Additional Medicare IM Given:    Additional Medicare Important Message give by:     If discussed at [place] of Stay Meetings, dates discussed:    Additional Comments:ur review done  Lacretia Leigh, RN 05/01/2015, 9:23 AM

## 2015-05-01 NOTE — Anesthesia Preprocedure Evaluation (Addendum)
Anesthesia Evaluation  Patient identified by MRN, date of birth, ID band Patient awake    Reviewed: Allergy & Precautions, H&P , NPO status , Patient's Chart, lab work & pertinent test results  Airway Mallampati: III  TM Distance: >3 FB Neck ROM: Full    Dental no notable dental hx. (+) Partial Upper, Dental Advisory Given   Pulmonary asthma ,  breath sounds clear to auscultation  Pulmonary exam normal       Cardiovascular hypertension, Pt. on medications + Peripheral Vascular Disease + dysrhythmias Atrial Fibrillation Rhythm:Regular Rate:Normal     Neuro/Psych Anxiety negative neurological ROS  negative psych ROS   GI/Hepatic negative GI ROS, Neg liver ROS, GERD-  Medicated and Controlled,  Endo/Other  Hypothyroidism   Renal/GU Renal InsufficiencyRenal disease  negative genitourinary   Musculoskeletal  (+) Arthritis -, Osteoarthritis,    Abdominal   Peds  Hematology negative hematology ROS (+)   Anesthesia Other Findings   Reproductive/Obstetrics negative OB ROS                            Anesthesia Physical Anesthesia Plan  ASA: III  Anesthesia Plan: General   Post-op Pain Management:    Induction: Intravenous  Airway Management Planned: Oral ETT  Additional Equipment:   Intra-op Plan:   Post-operative Plan: Extubation in OR  Informed Consent: I have reviewed the patients History and Physical, chart, labs and discussed the procedure including the risks, benefits and alternatives for the proposed anesthesia with the patient or authorized representative who has indicated his/her understanding and acceptance.   Dental advisory given  Plan Discussed with: CRNA and Surgeon  Anesthesia Plan Comments:         Anesthesia Quick Evaluation

## 2015-05-01 NOTE — Anesthesia Postprocedure Evaluation (Signed)
  Anesthesia Post-op Note  Patient: [name]  Procedure(s) Performed: Procedure(s): ARTHROPLASTY BIPOLAR HIP (HEMIARTHROPLASTY) (Left)  Patient Location: PACU  Anesthesia Type:General  Level of Consciousness: awake and alert   Airway and Oxygen Therapy: Patient Spontanous Breathing  Post-op Pain: mild  Post-op Assessment: Post-op Vital signs reviewed, Patient's Cardiovascular Status Stable and Respiratory Function Stable LLE Motor Response: Purposeful movement, Responds to commands LLE Sensation: Pain          Post-op Vital Signs: stable  Last Vitals:  Filed Vitals:   05/01/15 2100  BP: 169/51  Pulse: 61  Temp:   Resp: 16    Complications: No apparent anesthesia complications

## 2015-05-01 NOTE — Op Note (Signed)
OPERATIVE REPORT  SURGEON: Rod Can, MD   ASSISTANT: Ky Barban, RNFA.  PREOPERATIVE DIAGNOSIS: Left femoral neck fracture, displaced.   POSTOPERATIVE DIAGNOSIS: Left femoral neck fracture, displaced.   PROCEDURE: Left total hip arthroplasty, anterior approach.   IMPLANTS: DePuy Tri Lock stem, size 4, standard offset. DePuy Pinnacle Cup, size 50 mm. DePuy Altrx liner, size 50 by 32 mm, +4 neutral. DePuy Biolox ceramic head ball, size 32 + 1.5 mm.  ANESTHESIA:  General  ESTIMATED BLOOD LOSS: 300 mL.  ANTIBIOTICS: 1g vancomycin (PCN allergy).  DRAINS: None.  COMPLICATIONS: None.   CONDITION: PACU - hemodynamically stable.Marland Kitchen   BRIEF CLINICAL NOTE: [name] is a 81 y.o. female with a displaced Left femoral neck fracture. Due to her pre-injury activity level and independence, she elected to proceed with total hip arthroplasty. The risks, benefits, and alternatives to the procedure were explained, and the patient elected to proceed.  PROCEDURE IN DETAIL: Surgical site was marked by myself. Spinal anesthesia was obtained in the pre-op holding area. Once inside the operative room, a foley catheter was inserted. The patient was then positioned on the Hana table. All bony prominences were well padded. The hip was prepped and draped in the normal sterile surgical fashion. A time-out was called verifying side and site of surgery. The patient received IV antibiotics within 60 minutes of beginning the procedure.  The direct anterior approach to the hip was performed through the Hueter interval. Lateral femoral circumflex vessels were treated with the Auqumantys. The anterior capsule was exposed and an inverted T capsulotomy was made.The displaced subcapital femoral neck fracture was identified, and the hematoma was evacuated. The femoral neck cut was made to the level of the templated cut. A corkscrew was placed into the head and the head was removed.The head was  passed to the back table and was measured.  Acetabular exposure was achieved, and the pulvinar and labrum were excised. Sequental reaming of the acetabulum was then performed up to a size 49 mm reamer. A 50 mm cup was then opened and impacted into place at approximately 40 degrees of abduction and 20 degrees of anteversion. The final polyethylene liner was impacted into place.   I then gained femoral exposure taking care to protect the abductors and greater trochanter. This was performed using standard external rotation, extension, and adduction. The capsule was peeled off the inner aspect of the greater trochanter, taking care to preserve the short external rotators. A cookie cutter was used to enter the femoral canal, and then the femoral canal finder was placed. Sequential broaching was performed up to a size 4. Calcar planer was used on the femoral neck remnant. I paced a std offset neck and a trial head ball. The hip was reduced. Leg lengths and offset were checked fluoroscopically. Her leg was lengthened a few millimeters, and this was acceptable.  The hip was dislocated and trial components were removed. The final implants were placed, and the hip was reduced.  Fluoroscopy was used to confirm component position and leg lengths. At 90 degrees of external rotation and full extension, the hip was stable to an anterior directed force.  The wound was copiously irrigated with a dilute betadine solution followed by normal saline. Marcaine solution was injected into the periarticular soft tissue. The wound was closed in layers using #1 Vicryl and V-Loc for the fascia, 2-0 Vicryl for the subcutaneous fat, 2-0 Monocryl for the deep dermal layer, 3-0 running Monocryl subcuticular stitch, and Dermabond for the skin.  Once the glue was fully dried, an Aquacell Ag dressing was applied. The patient was transported to the recovery room in stable condition. Sponge, needle, and instrument counts were  correct at the end of the case x2. The patient tolerated the procedure well and there were no known complications.

## 2015-05-01 NOTE — Progress Notes (Signed)
[facility] PROGRESS NOTE  [name] [id number] DOB: September 27, 1924 DOA: 04/28/2015 PCP: [name], MD  Assessment/Plan: 1-Syncope and collapse: appears to be cardiogenic in nature due to tachy-brady syndrome -will continue holding b-blockers and amiodarone -patient will remains in stepdown with with external pacers -cardiology on board and planning for pacemaker implantation today (6/20) -eliquis/anticoagulation on hold currently given hematoma and needs for surgical intervention -will follow and replete electrolytes as needed   2-Hypothyroidism:  -TSH WNL -continue synthroid at current dose  3-Hypertensive heart disease: uncontrolled -pain most likely contributing -will continue hydralazine, dose increased as per cardiology rec's -continue imdur -cardiology has now discontinue losartan and increased dose of amlodipine -HR has remained stable -will monitor -after pacemaker implanted will be able to use either coreg or labetalol for BP control  4-left hip fracture -high risk for surgery, but benefits outweigh risks -first will need to have pacemaker (most likely temporary) in place and be clear by cardiology -PRN analgesics and bed rest for now -ortho is on board, planning for surgery most likely later today (6/20) -will continue holding eliquis and heparin products  5-GERD (gastroesophageal reflux disease): continue PPI  6-anemia of chronic disease: slightly worse, most likely from hematoma on left hip after fall -will hold eliquis and heparin products for now -will follow Hgb trend -CBC today with Hgb of 10.6 -no signs of overt bleeding  -status post 2 units of PRBC's  7-chronic kidney disease, stage III:  -Cr essentially at baseline (normally Cr in 2.3-2.4 range) -will monitor and follow renal function trend  8-hypertensive heart disease with chronic diastolic heart failure: compensated and stable -will follow daily weights -strict intake and  output -heart healthy diet -no SOB at this moment  9-IBS: will continue PRN levsin  Code Status: Full Family Communication: no family at bedside Disposition Plan: remains inpatient; Monday with tentative pace maker and subsequently hip fracture.   Consultants:  Cardiology  Orthopedic service   Procedures:  See below for x-ray reports   Antibiotics:  None   HPI/Subjective: Feeling ok, no SOB and no fever. Left hip pain with movement. No CP. Patient ready to have pacemaker implantation and hip replacement surgery.  Objective: Filed Vitals:   05/01/15 1400  BP: 163/57  Pulse: 59  Temp:   Resp: 16    Intake/Output Summary (Last 24 hours) at 05/01/15 1416 Last data filed at 05/01/15 1400  Gross per 24 hour  Intake 1584.17 ml  Output   1325 ml  Net 259.17 ml   Filed Weights   04/29/15 0500 04/30/15 0500 05/01/15 0500  Weight: 61.2 kg (134 lb 14.7 oz) 62 kg (136 lb 11 oz) 59.6 kg (131 lb 6.3 oz)    Exam:   General:  Afebrile, no SOB, patient with hip pain when moving her left leg (but controlled with current pain meds). No further episodes of lightheadedness and/or dizzy feeling. HR stable (sinus per telemetry). BP remains elevated.  Cardiovascular: sinus bradycardia currently, positive SM, no rubs  Respiratory: no wheezing, no crackles; good air movement  Abdomen: soft, NT, ND, positive BS  Musculoskeletal: no edema or cyanosis   Data Reviewed: Basic Metabolic Panel:  Recent Labs Lab 04/28/15 1542 04/28/15 2123 04/30/15 0305 05/01/15 0021  NA 143  --  137 136  K 4.6  --  4.1 4.1  CL 103  --  100* 97*  CO2 25  --  30 30  GLUCOSE 113*  --  101* 109*  BUN 60*  --  48* 45*  CREATININE 2.37*  --  2.25* 2.41*  CALCIUM 8.5*  --  8.2* 8.0*  MG  --  1.7  --   --   PHOS  --  4.1  --   --    Liver Function Tests:  Recent Labs Lab 04/28/15 1542  AST 31  ALT 32  ALKPHOS 69  BILITOT 0.8  PROT 6.2*  ALBUMIN 3.5   CBC:  Recent Labs Lab  04/28/15 1542 04/30/15 0305 05/01/15 0021  WBC 5.9 4.3 6.9  NEUTROABS 4.0  --   --   HGB 9.1* 7.1* 10.6*  HCT 27.9* 21.4* 31.2*  MCV 85.8 85.3 84.1  PLT 199 134* 143*   Cardiac Enzymes:  Recent Labs Lab 04/28/15 1542 04/30/15 0305  TROPONINI 0.04* 0.05*   CBG:  Recent Labs Lab 04/28/15 1659  GLUCAP 110*    Recent Results (from the past 240 hour(s))  MRSA PCR Screening     Status: None   Collection Time: 04/28/15  8:05 PM  Result Value Ref Range Status   MRSA by PCR NEGATIVE NEGATIVE Final    Comment:        The GeneXpert MRSA Assay (FDA approved for NASAL specimens only), is one component of a comprehensive MRSA colonization surveillance program. It is not intended to diagnose MRSA infection nor to guide or monitor treatment for MRSA infections.   Surgical pcr screen     Status: None   Collection Time: 04/29/15 10:17 PM  Result Value Ref Range Status   MRSA, PCR NEGATIVE NEGATIVE Final   Staphylococcus aureus NEGATIVE NEGATIVE Final    Comment:        The Xpert SA Assay (FDA approved for NASAL specimens in patients over 38 years of age), is one component of a comprehensive surveillance program.  Test performance has been validated by [facility] for patients greater than or equal to 57 year old. It is not intended to diagnose infection nor to guide or monitor treatment.      Studies: No results found.  Scheduled Meds: . sodium chloride   Intravenous Once  . amLODipine  5 mg Oral Daily  . hydrALAZINE  150 mg Oral 3 times per day  . isosorbide mononitrate  60 mg Oral Daily  . levothyroxine  88 mcg Oral QAC breakfast  . pantoprazole  40 mg Oral Daily  . sodium chloride  3 mL Intravenous Q12H  . sodium chloride  3 mL Intravenous Q12H  . vancomycin  1,000 mg Intravenous To SS-Surg  . vancomycin  1,000 mg Intravenous Q12H   Continuous Infusions: . sodium chloride 50 mL/hr (05/01/15 1217)    Principal Problem:   Syncope and collapse Active  Problems:   Hypothyroidism   Hypertensive heart disease   GERD (gastroesophageal reflux disease)   LBBB (left bundle branch block)- new 11/01/13   Hypertensive heart/kidney disease w/chronic kidney disease stage IV   Systolic hypertension, isolated; difficult to control   Symptomatic bradycardia   Right bundle branch block (RBBB) intermittent    Fracture, femur, lt   Laceration of scalp    Time spent: 35 minutes    Barton Dubois  [facility] Pager [phone]. If 7PM-7AM, please contact night-coverage at [email or web address], password [facility] 05/01/2015, 2:16 PM  LOS: 3 days

## 2015-05-01 NOTE — H&P (View-Only) (Signed)
ORTHOPAEDIC CONSULTATION  REQUESTING PHYSICIAN: Barton Dubois, MD  PCP:  [name], MD  Chief Complaint: left hip pain  HPI: [name] is a 81 y.o. female who complains of  Left hip pain after a fall yesterday. Became dizzy and woke up on the groud. Was unable to WB due to left hip pain. Found to be bradycardic in the ED. Consulted for left femoral neck fracture. Denies other injuries other than scalp laceration. Lives alone. Community ambulator with cane or walker. Does her own shopping.  Past Medical History  Diagnosis Date  . Hyperlipidemia   . Hypothyroidism   . Arthritis   . Anemia   . Asthma   . Anxiety   . IBS (irritable bowel syndrome)   . Gout   . Colon polyps   . Tubulovillous adenoma 4/07  . Hemorrhoids   . Diverticulosis   . Chronic diarrhea   . Chronic kidney disease, stage IV (severe)   . Hypertensive heart disease     no significant RAS by MRA 2002- (<30% LRAS)   . Paroxysmal atrial fibrillation 11/01/2013    Intermittent through the years and recurrent in December of 2014 treated with amiodarone;; Negative Myoview 10/2013  . Peripheral vascular disease     123456 LICA, [id number] RICA by doppler 2009   . Benign positional vertigo   . LBBB (left bundle branch block)- new 11/01/13 11/01/2013  . Diastolic dysfunction, left ventricle 11/03/2013  . Syncope and collapse 04/28/2015  . Sinus arrest 04/28/2015  . LBBB (left bundle branch block)    Past Surgical History  Procedure Laterality Date  . Lumbar disc surgery  2000  . Squamous cell skin cancer removed    . Oophorectomy  1962    right  . Appendectomy    . Low anterior bowel resection  7/08  . Cataract extraction    . Transthoracic echocardiogram  11/03/2013    Echocardiogram: EF 55-60%, mild LVH, elevated bili pressures. Mild aortic valve calcification.  Marland Kitchen Nm myoview ltd  11/02/2013    Negative for ischemia or infarction. EF 60%.   History   Social History  . Marital Status: Widowed   Spouse Name: N/A  . Number of Children: N/A  . Years of Education: N/A   Social History Main Topics  . Smoking status: Never Smoker   . Smokeless tobacco: Never Used     Comment: never used tobacco  . Alcohol Use: No  . Drug Use: No  . Sexual Activity: No   Other Topics Concern  . None   Social History Narrative   She is a widowed mother of 2, one child is deceased. Grandmother of one. She appears to be working out routinely at [employer] using [employer]. She is otherwise quite active.   She does not drink alcohol, does not smoke.  She is retired from [employer].    Lives alone in a one story condo.   Family History  Problem Relation Age of Onset  . Cancer Mother 64  . Heart attack Father   . Heart disease Brother   . Stroke Maternal Grandmother   . Cancer Maternal Grandfather     Lung cancer  . Stroke Paternal Grandmother   . Cancer Daughter    Allergies  Allergen Reactions  . Ambien [Zolpidem Tartrate]     " made her crazy "  . Amoxicillin     REACTION: unspecified  . Ceftin [Cefuroxime Axetil] Diarrhea  . Codeine Phosphate  REACTION: unspecified  . Colchicine     Severe diarrhea  . Lipitor [Atorvastatin] Other (See Comments)    "makes legs jump all night"  . Penicillins     REACTION: nausea, swelling  . Xifaxan [Rifaximin] Other (See Comments)    unknown   Prior to Admission medications   Medication Sig Start Date End Date Taking? Authorizing Provider  amiodarone (PACERONE) 200 MG tablet Take 0.5 tablets (100 mg total) by mouth daily. 04/27/15  Yes [name], MD  [facility] VITAMIN B12 1000 MCG tablet TAKE 1 TABLET BY MOUTH EVERY DAY 01/06/15  Yes [name], MD  ELIQUIS 2.5 MG TABS tablet TAKE 1 TABLET BY MOUTH TWICE A DAY 12/01/14  Yes [name], MD  furosemide (LASIX) 40 MG tablet Take 1 tablet (40 mg total) by mouth daily as needed for fluid. 03/23/15  Yes Luke K Kilroy, PA-C  hydrALAZINE (APRESOLINE) 100 MG tablet Take 1  tablet (100 mg total) by mouth 3 (three) times daily. 10/03/14  Yes [name], MD  hyoscyamine (LEVSIN, ANASPAZ) 0.125 MG tablet Take 1-2 tablets by mouth or under tongue every 4 hours as needed before meals 02/16/15  Yes Ladene Artist, MD  isosorbide mononitrate (IMDUR) 60 MG 24 hr tablet TAKE 1 TABLET (60 MG TOTAL) BY MOUTH DAILY. 03/14/15  Yes [name], MD  levothyroxine (SYNTHROID, LEVOTHROID) 88 MCG tablet TAKE 1 TABLET (88 MCG TOTAL) BY MOUTH DAILY BEFORE BREAKFAST. 03/30/15  Yes [name], MD  LORazepam (ATIVAN) 0.5 MG tablet Take 1 tablet (0.5 mg total) by mouth every 8 (eight) hours as needed for anxiety. Patient taking differently: Take 0.5 mg by mouth daily as needed for anxiety.  11/15/14  Yes [name], MD  nitroGLYCERIN (NITROSTAT) 0.4 MG SL tablet Place 1 tablet (0.4 mg total) under the tongue every 5 (five) minutes x 3 doses as needed for chest pain. 07/31/14  Yes Rogelia Mire, NP  predniSONE (DELTASONE) 10 MG tablet Take 4 tabs a day for 4 days, then 3 a day for 4 days, then 2 a day for 4 days, then 1 a day for 4 days, then stop 04/17/15  Yes [name], MD  PROAIR HFA 108 (90 BASE) MCG/ACT inhaler Inhale 2 puffs into the lungs every 4 (four) hours as needed. Severe cough/wheeze 10/18/14  Yes Historical Provider, MD  temazepam (RESTORIL) 30 MG capsule Take 1 capsule (30 mg total) by mouth at bedtime as needed for sleep. 11/15/14  Yes [name], MD  omeprazole (PRILOSEC) 40 MG capsule Take 1 capsule (40 mg total) by mouth daily. 04/17/15   [name], MD   Ct Head Wo Contrast  04/28/2015   CLINICAL DATA:  Syncope, head laceration after fall. Positive loss consciousness.  EXAM: CT HEAD WITHOUT CONTRAST  CT CERVICAL SPINE WITHOUT CONTRAST  TECHNIQUE: Multidetector CT imaging of the head and cervical spine was performed following the standard protocol without intravenous contrast. Multiplanar CT image reconstructions of the cervical spine were also generated.   COMPARISON:  CT scan of November 19, 2014.  FINDINGS: CT HEAD FINDINGS  Bony calvarium appears intact. Mild diffuse cortical atrophy is noted. No mass effect or midline shift is noted. Ventricular size is within normal limits. There is no evidence of mass lesion, hemorrhage or acute infarction.  CT CERVICAL SPINE FINDINGS  No fracture or spondylolisthesis is noted. Severe degenerative disc disease is noted at C4-5, C5-6 and C6-7 with anterior osteophyte formation.  Posterior facet joints appear intact. Visualized lung apices appear normal.  IMPRESSION: Mild diffuse cortical atrophy. No acute intracranial abnormality seen.  Severe multilevel degenerative disc disease seen in cervical spine. No fracture or spondylolisthesis is noted.   Electronically Signed   By: Marijo Conception, M.D.   On: 04/28/2015 16:39   Ct Cervical Spine Wo Contrast  04/28/2015   CLINICAL DATA:  Syncope, head laceration after fall. Positive loss consciousness.  EXAM: CT HEAD WITHOUT CONTRAST  CT CERVICAL SPINE WITHOUT CONTRAST  TECHNIQUE: Multidetector CT imaging of the head and cervical spine was performed following the standard protocol without intravenous contrast. Multiplanar CT image reconstructions of the cervical spine were also generated.  COMPARISON:  CT scan of November 19, 2014.  FINDINGS: CT HEAD FINDINGS  Bony calvarium appears intact. Mild diffuse cortical atrophy is noted. No mass effect or midline shift is noted. Ventricular size is within normal limits. There is no evidence of mass lesion, hemorrhage or acute infarction.  CT CERVICAL SPINE FINDINGS  No fracture or spondylolisthesis is noted. Severe degenerative disc disease is noted at C4-5, C5-6 and C6-7 with anterior osteophyte formation. Posterior facet joints appear intact. Visualized lung apices appear normal.  IMPRESSION: Mild diffuse cortical atrophy. No acute intracranial abnormality seen.  Severe multilevel degenerative disc disease seen in cervical spine. No fracture or  spondylolisthesis is noted.   Electronically Signed   By: Marijo Conception, M.D.   On: 04/28/2015 16:39   Dg Pelvis Portable  04/28/2015   CLINICAL DATA:  Recent syncopal episode with fall, pelvic pain  EXAM: PORTABLE PELVIS 1-2 VIEWS  COMPARISON:  02/26/2013  FINDINGS: The pelvic ring appears intact. Angulation along the superior aspect of the femoral head is noted with increased density along the femoral neck at the junction with the femoral head. This is highly suspicious for a mildly impacted fracture. Dedicated hip films or limited CT of the pelvis may be helpful. Degenerative changes of the lumbar spine are noted.  IMPRESSION: Changes in the proximal left femur as described suggestive of mildly impacted fracture.   Electronically Signed   By: Inez Catalina M.D.   On: 04/28/2015 16:37   Dg Chest Port 1 View  04/28/2015   CLINICAL DATA:  Fall, syncope, bradycardia  EXAM: PORTABLE CHEST - 1 VIEW  COMPARISON:  07/29/2014  FINDINGS: Pacing pads overlie the chest. Implanted monitor device projects over the left hemithorax. Moderate enlargement of the cardiomediastinal silhouette is noted without evidence for edema. No displaced rib fracture allowing for technique. No focal pulmonary opacity. No supine evidence for pneumothorax. No pleural effusion. Presumed skin fold artifact simulating pneumothorax at the right lung base on repeat image 2.  IMPRESSION: Cardiomegaly without focal acute finding.   Electronically Signed   By: Conchita Paris M.D.   On: 04/28/2015 16:40   Dg Femur Min 2 Views Left  04/28/2015   CLINICAL DATA:  Closed left hip fracture, initial encounter.  EXAM: LEFT FEMUR 2 VIEWS  COMPARISON:  None.  FINDINGS: Findings consistent with mildly displaced proximal left femoral neck fracture. This appears closed and posttraumatic. Remaining portion of femur appears normal.  IMPRESSION: Probable mildly displaced proximal left femoral neck fracture.   Electronically Signed   By: Marijo Conception, M.D.    On: 04/28/2015 18:12    Positive ROS: All other systems have been reviewed and were otherwise negative with the exception of those mentioned in the HPI and as above.  Physical Exam: General: Alert, no acute  distress Cardiovascular: No pedal edema Respiratory: No cyanosis, no use of accessory musculature GI: No organomegaly, abdomen is soft and non-tender Skin: No lesions in the area of chief complaint Neurologic: Sensation intact distally Psychiatric: Patient is competent for consent with normal mood and affect Lymphatic: No axillary or cervical lymphadenopathy  MUSCULOSKELETAL: LLE shortened and externally rotated. Pain with logroll. Unable to SLR. Has ecchymosis/hematoma to lateral hip. 2+ DP. + TA/GS/EHL.   Assessment: Displaced left femoral neck fracture with comminution and retroversion of the femoral head Dysrhythmia  Plan: I discussed the findings with the patient. We discussed closed reduction percutaneous pinning vs arthroplasty. She elects to proceed with arthroplasty which will more predictably restore her mobility and function. We must wait until Monday due to Eliquis.  The risks, benefits, and alternatives were discussed with the patient. There are risks associated with the surgery including, but not limited to, problems with anesthesia (death), infection, dislocation, instability (giving out of the joint), dislocation, differences in leg length/angulation/rotation, fracture of bones, loosening or failure of implants, hematoma (blood accumulation) which may require surgical drainage, blood clots, pulmonary embolism, nerve injury (foot drop and lateral thigh numbness), and blood vessel injury. The patient understands these risks and elects to proceed.   I discussed the patient with Dr. Caryl Comes, who plans for pacemaker early Monday morning. Will follow Monday afternoon with left hip hemiarthroplasty vs THA. We will restart Eliquis on Tues am. Bedrest for now. NPO after MN on  Sunday night.    Sakia Schrimpf, Horald Pollen, MD Cell [phone]    04/29/2015 8:24 AM

## 2015-05-01 NOTE — Transfer of Care (Signed)
Immediate Anesthesia Transfer of Care Note  Patient: [name]  Procedure(s) Performed: Procedure(s): ARTHROPLASTY BIPOLAR HIP (HEMIARTHROPLASTY) (Left)  Patient Location: PACU  Anesthesia Type:General  Level of Consciousness: awake  Airway & Oxygen Therapy: Patient Spontanous Breathing and Patient connected to nasal cannula oxygen  Post-op Assessment: Report given to RN and Post -op Vital signs reviewed and stable  Post vital signs: stable  Last Vitals:  Filed Vitals:   05/01/15 1530  BP: 184/44  Pulse: 59  Temp:   Resp: 14    Complications: No apparent anesthesia complications

## 2015-05-01 NOTE — Anesthesia Procedure Notes (Signed)
Procedure Name: Intubation Date/Time: 05/01/2015 5:13 PM Performed by: Ignacia Bayley Pre-anesthesia Checklist: Patient identified, Emergency Drugs available, Suction available and Patient being monitored Patient Re-evaluated:Patient Re-evaluated prior to inductionOxygen Delivery Method: Circle system utilized Preoxygenation: Pre-oxygenation with 100% oxygen Intubation Type: IV induction Ventilation: Mask ventilation without difficulty Grade View: Grade I Tube type: Oral Tube size: 7.0 mm Number of attempts: 1 Airway Equipment and Method: Stylet Placement Confirmation: ETT inserted through vocal cords under direct vision,  positive ETCO2 and breath sounds checked- equal and bilateral Secured at: 21 cm Tube secured with: Tape Dental Injury: Teeth and Oropharynx as per pre-operative assessment

## 2015-05-02 ENCOUNTER — Encounter (HOSPITAL_COMMUNITY): Payer: Self-pay | Admitting: Orthopedic Surgery

## 2015-05-02 ENCOUNTER — Inpatient Hospital Stay (HOSPITAL_COMMUNITY): Payer: Medicare Other

## 2015-05-02 LAB — BASIC METABOLIC PANEL
Anion gap: 8 (ref 5–15)
BUN: 41 mg/dL — AB (ref 6–20)
CHLORIDE: 99 mmol/L — AB (ref 101–111)
CO2: 26 mmol/L (ref 22–32)
Calcium: 8 mg/dL — ABNORMAL LOW (ref 8.9–10.3)
Creatinine, Ser: 2 mg/dL — ABNORMAL HIGH (ref 0.44–1.00)
GFR calc Af Amer: 24 mL/min — ABNORMAL LOW (ref >60)
GFR, EST NON AFRICAN AMERICAN: 21 mL/min — AB (ref >60)
GLUCOSE: 198 mg/dL — AB (ref 65–99)
POTASSIUM: 4.9 mmol/L (ref 3.5–5.1)
Sodium: 133 mmol/L — ABNORMAL LOW (ref 135–145)

## 2015-05-02 LAB — CBC
HEMATOCRIT: 30.9 % — AB (ref 36.0–46.0)
Hemoglobin: 10.5 g/dL — ABNORMAL LOW (ref 12.0–15.0)
MCH: 28.8 pg (ref 26.0–34.0)
MCHC: 34 g/dL (ref 30.0–36.0)
MCV: 84.7 fL (ref 78.0–100.0)
Platelets: 126 10*3/uL — ABNORMAL LOW (ref 150–400)
RBC: 3.65 MIL/uL — AB (ref 3.87–5.11)
RDW: 16.8 % — AB (ref 11.5–15.5)
WBC: 8.2 10*3/uL (ref 4.0–10.5)

## 2015-05-02 MED FILL — Heparin Sodium (Porcine) 2 Unit/ML in Sodium Chloride 0.9%: INTRAMUSCULAR | Qty: 500 | Status: AC

## 2015-05-02 NOTE — Clinical Social Work Note (Signed)
Clinical Social Work Assessment  Patient Details  Name: [name] MRN: [id number] Date of Birth: Mar 19, 1924  Date of referral:  05/02/15               Reason for consult:  Facility Placement                Permission sought to share information with:  Facility Art therapist granted to share information::  Yes, Verbal Permission Granted  Name::        Agency::  SNF for referral purposes  Relationship::     Contact Information:     Housing/Transportation Living arrangements for the past 2 months:  Single Family Home Source of Information:  Patient, Adult Children Patient Interpreter Needed:  None Criminal Activity/Legal Involvement Pertinent to Current Situation/Hospitalization:  No - Comment as needed Significant Relationships:  Adult Children, [name] Lives with:  Self Do you feel safe going back to the place where you live?  No Need for family participation in patient care:  No (Coment) (However, pt requests her son be present for conversations)  Care giving concerns:  Pt requiring ST rehab at dc due to mobility issues and limited support while family is working.   Social Worker assessment / plan:  CSW visited pt room and spoke with pt and pt son about recommendation. Pt and pt son aware of recommendation and in agreement that ST rehab would be best/safest option at dc. Pt and pt son unfamiliar with area SNFs and expressed no facility preference. CSW explained SNF referral process and pt and pt son agreeable to referral being sent to all [facility].   Employment status:  Retired Nurse, adult PT Recommendations:  [facility] / Referral to community resources:  [facility]  Patient/Family's Response to care:  Pt and pt family in agreement with plan of care at this time.  Patient/Family's Understanding of and Emotional Response to Diagnosis, Current Treatment, and Prognosis:  Pt and  pt son have a good understanding of pt condition and demonstrate this by appropriate questions and decision making. Pt and pt son with an appropriate emotion response. Pt son expressed some worry since this process is unfamiliar. CSW provided support.  Emotional Assessment Appearance:  Appears stated age, Well-Groomed Attitude/Demeanor/Rapport:  Other (Cooperative) Affect (typically observed):  Accepting, Appropriate, Calm, Hopeful Orientation:  Oriented to Self, Oriented to Place, Oriented to  Time, Oriented to Situation Alcohol / Substance use:  Not Applicable Psych involvement (Current and /or in the community):  No (Comment)  Discharge Needs  Concerns to be addressed:  Discharge Planning Concerns Readmission within the last 30 days:  No Current discharge risk:  Dependent with Mobility, Lives alone Barriers to Discharge:  Continued Medical Work up   [employer], [place]

## 2015-05-02 NOTE — Evaluation (Signed)
Occupational Therapy Evaluation Patient Details Name: [name] MRN: [id number] DOB: Dec 15, 1923 Today's Date: 05/02/2015    History of Present Illness Pt s/p fall due to syncopal episode with resulant left femoral neck fracture, displaced. Now s/p left total hip arthroplasty, anterior approach and pacemaker. PMH significant for hypertension, hypothyroidism, anemia, CKD stage 3-4; PAF, GERD and chronic diastolic heart failure   Clinical Impression   This 81 yo female admitted and underwent above presents to acute OT with decreased range of LLE and LUE, decreased balance, decreased mobility all affecting her ability to care for herself at an Independent level as she was pta. She will benefit from acute OT with follow up at SNF to get to a Mod I to independent level and return home alone.    Follow Up Recommendations  SNF    Equipment Recommendations   (TBD at next venue)       Precautions / Restrictions Precautions Precautions: Fall Restrictions Weight Bearing Restrictions: Yes LUE Weight Bearing: Non weight bearing LLE Weight Bearing: Weight bearing as tolerated      Mobility Bed Mobility Overal bed mobility: Needs Assistance Bed Mobility: Supine to Sit     Supine to sit: Min guard;HOB elevated (with VCs for technique)        Transfers Overall transfer level: Needs assistance Equipment used: Rolling walker (2 wheeled) Transfers: Sit to/from [facility] Sit to Stand: Min assist Stand pivot transfers: Min assist                 ADL Overall ADL's : Needs assistance/impaired Eating/Feeding: Independent;Sitting   Grooming: Set up;Sitting   Upper Body Bathing: Set up;Sitting   Lower Body Bathing: Minimal assistance;Sit to/from stand   Upper Body Dressing : Minimal assistance;Sitting   Lower Body Dressing: Moderate assistance (with min A sit<>stand)   Toilet Transfer: Minimal assistance;Stand-pivot (bed>recliner)   Toileting-  Clothing Manipulation and Hygiene: Moderate assistance (with min A sit<>stand)               Vision Additional Comments: No change from baseline          Pertinent Vitals/Pain Pain Assessment: No/denies pain     Hand Dominance Right   Extremity/Trunk Assessment Upper Extremity Assessment Upper Extremity Assessment: LUE deficits/detail LUE Deficits / Details: pacemaker placed  LUE Coordination: decreased gross motor           Communication Communication Communication: No difficulties   Cognition Arousal/Alertness: Awake/alert Behavior During Therapy: WFL for tasks assessed/performed Overall Cognitive Status: Within Functional Limits for tasks assessed                                Home Living Family/patient expects to be discharged to:: Skilled nursing facility Living Arrangements: Alone Available Help at Discharge: Family (children could stay at night) Type of Home: House Home Access: Stairs to enter [employer] of Steps: stoop Entrance Stairs-Rails: None Home Layout: One level     Bathroom Shower/Tub: Occupational psychologist: Standard     Home Equipment: Bedside commode          Prior Functioning/Environment Level of Independence: Independent        Comments: including driving    OT Diagnosis: Generalized weakness   OT Problem List: Decreased strength;Decreased range of motion;Impaired balance (sitting and/or standing);Impaired UE functional use;Decreased knowledge of precautions;Decreased knowledge of use of DME or AE   OT Treatment/Interventions: Self-care/ADL training;Patient/family education;Balance training;DME  and/or AE instruction    OT Goals(Current goals can be found in the care plan section) Acute Rehab OT Goals Patient Stated Goal: not sure if home with help or going somewhere OT Goal Formulation: With patient Time For Goal Achievement: 05/09/15 Potential to Achieve Goals: Good  OT Frequency: Min  2X/week   Barriers to D/C: Decreased caregiver support             End of Session Equipment Utilized During Treatment: Gait belt;Rolling walker (sling for LUE)  Activity Tolerance: Patient tolerated treatment well Patient left: in chair;with call bell/phone within reach   Time: 1040-1106 OT Time Calculation (min): 26 min Charges:  OT General Charges $OT Visit: 1 Procedure OT Evaluation $Initial OT Evaluation Tier I: 1 Procedure OT Treatments $Self Care/Home Management : 8-22 mins  Almon Register [id number] 05/02/2015, 11:41 AM

## 2015-05-02 NOTE — Discharge Instructions (Addendum)
°Dr. Brian Swinteck °Joint Replacement Specialist °[facility] °[address] °[place],  [address] °[phone] ° ° °TOTAL HIP REPLACEMENT POSTOPERATIVE DIRECTIONS ° ° ° °Hip Rehabilitation, Guidelines Following Surgery  ° °WEIGHT BEARING °Weight bearing as tolerated with assist device (walker, cane, etc) as directed, use it as long as suggested by your surgeon or therapist, typically at least 4-6 weeks. ° °The results of a hip operation are greatly improved after range of motion and muscle strengthening exercises. Follow all safety measures which are given to protect your hip. If any of these exercises cause increased pain or swelling in your joint, decrease the amount until you are comfortable again. Then slowly increase the exercises. Call your caregiver if you have problems or questions.  ° °HOME CARE INSTRUCTIONS  °Most of the following instructions are designed to prevent the dislocation of your new hip.  °Remove items at home which could result in a fall. This includes throw rugs or furniture in walking pathways.  °Continue medications as instructed at time of discharge. °· You may have some home medications which will be placed on hold until you complete the course of blood thinner medication. °· You may start showering once you are discharged home. Do not remove your dressing. °Do not put on socks or shoes without following the instructions of your caregivers.   °Sit on chairs with arms. Use the chair arms to help push yourself up when arising.  °Arrange for the use of a toilet seat elevator so you are not sitting low.  °· Walk with walker as instructed.  °You may resume a sexual relationship in one month or when given the OK by your caregiver.  °Use walker as long as suggested by your caregivers.  °You may put full weight on your legs and walk as much as is comfortable. °Avoid periods of inactivity such as sitting longer than an hour when not asleep. This helps prevent  blood clots.  °You may return to work once you are cleared by your surgeon.  °Do not drive a car for 6 weeks or until released by your surgeon.  °Do not drive while taking narcotics.  °Wear elastic stockings for two weeks following surgery during the day but you may remove then at night.  °Make sure you keep all of your appointments after your operation with all of your doctors and caregivers. You should call the office at the above phone number and make an appointment for approximately two weeks after the date of your surgery. °Please pick up a stool softener and laxative for home use as long as you are requiring pain medications. °· ICE to the affected hip every three hours for 30 minutes at a time and then as needed for pain and swelling. Continue to use ice on the hip for pain and swelling from surgery. You may notice swelling that will progress down to the foot and ankle.  This is normal after surgery.  Elevate the leg when you are not up walking on it.   °It is important for you to complete the blood thinner medication as prescribed by your doctor. °· Continue to use the breathing machine which will help keep your temperature down.  It is common for your temperature to cycle up and down following surgery, especially at night when you are not up moving around and exerting yourself.  The breathing machine keeps your lungs expanded and your temperature down. ° °RANGE OF MOTION AND STRENGTHENING EXERCISES  °These exercises are   designed to help you keep full movement of your hip joint. Follow your caregiver's or physical therapist's instructions. Perform all exercises about fifteen times, three times per day or as directed. Exercise both hips, even if you have had only one joint replacement. These exercises can be done on a training (exercise) mat, on the floor, on a table or on a bed. Use whatever works the best and is most comfortable for you. Use music or television while you are exercising so that the exercises  are a pleasant break in your day. This will make your life better with the exercises acting as a break in routine you can look forward to.  Lying on your back, slowly slide your foot toward your buttocks, raising your knee up off the floor. Then slowly slide your foot back down until your leg is straight again.  Lying on your back spread your legs as far apart as you can without causing discomfort.  Lying on your side, raise your upper leg and foot straight up from the floor as far as is comfortable. Slowly lower the leg and repeat.  Lying on your back, tighten up the muscle in the front of your thigh (quadriceps muscles). You can do this by keeping your leg straight and trying to raise your heel off the floor. This helps strengthen the largest muscle supporting your knee.  Lying on your back, tighten up the muscles of your buttocks both with the legs straight and with the knee bent at a comfortable angle while keeping your heel on the floor.   SKILLED REHAB INSTRUCTIONS: If the patient is transferred to a skilled rehab facility following release from the hospital, a list of the current medications will be sent to the facility for the patient to continue.  When discharged from the skilled rehab facility, please have the facility set up the patient's [place] prior to being released. Also, the skilled facility will be responsible for providing the patient with their medications at time of release from the facility to include their pain medication and their blood thinner medication. If the patient is still at the rehab facility at time of the two week follow up appointment, the skilled rehab facility will also need to assist the patient in arranging follow up appointment in our office and any transportation needs.  MAKE SURE YOU:  Understand these instructions.  Will watch your condition.  Will get help right away if you are not doing well or get worse.  Pick up stool softner and  laxative for home use following surgery while on pain medications. Do not remove your dressing. The dressing is waterproof--it is OK to take showers. Continue to use ice for pain and swelling after surgery. Do not use any lotions or creams on the incision until instructed by your surgeon. Total Hip Protocol.  Information on my medicine - ELIQUIS (apixaban)  This medication education was reviewed with me or my healthcare representative as part of my discharge preparation.    Why was Eliquis prescribed for you? Eliquis was prescribed for you to reduce the risk of a blood clot forming that can cause a stroke if you have a medical condition called atrial fibrillation (a type of irregular heartbeat).  What do You need to know about Eliquis ? Take your Eliquis TWICE DAILY - one tablet in the morning and one tablet in the evening with or without food. If you have difficulty swallowing the tablet whole please discuss with your pharmacist how  to take the medication safely.  Take Eliquis exactly as prescribed by your doctor and DO NOT stop taking Eliquis without talking to the doctor who prescribed the medication.  Stopping may increase your risk of developing a stroke.  Refill your prescription before you run out.  After discharge, you should have regular check-up appointments with your healthcare provider that is prescribing your Eliquis.  In the future your dose may need to be changed if your kidney function or weight changes by a significant amount or as you get older.  What do you do if you miss a dose? If you miss a dose, take it as soon as you remember on the same day and resume taking twice daily.  Do not take more than one dose of ELIQUIS at the same time to make up a missed dose.  Important Safety Information A possible side effect of Eliquis is bleeding. You should call your healthcare provider right away if you experience any of the following: ? Bleeding from an injury or your  nose that does not stop. ? Unusual colored urine (red or dark brown) or unusual colored stools (red or black). ? Unusual bruising for unknown reasons. ? A serious fall or if you hit your head (even if there is no bleeding).  Some medicines may interact with Eliquis and might increase your risk of bleeding or clotting while on Eliquis. To help avoid this, consult your healthcare provider or pharmacist prior to using any new prescription or non-prescription medications, including herbals, vitamins, non-steroidal anti-inflammatory drugs (NSAIDs) and supplements.  This website has more information on Eliquis (apixaban): http://www.eliquis.com/eliquis/home     Supplemental Discharge Instructions for  Pacemaker/Defibrillator Patients  Activity No heavy lifting or vigorous activity with your left/right arm for 6 to 8 weeks.  Do not raise your left/right arm above your head for one week.  Gradually raise your affected arm as drawn below.           __       05/06/15               05/07/15                     05/08/15                 05/09/15  NO DRIVING for 1 week    ; you may begin driving on [id number] from [facility] standpoint    .  WOUND CARE - Keep the wound area clean and dry.  Do not get this area wet for one week. No showers for one week; you may shower on   05/09/15  . - The tape/steri-strips on your wound will fall off; do not pull them off.  No bandage is needed on the site.  DO  NOT apply any creams, oils, or ointments to the wound area. - If you notice any drainage or discharge from the wound, any swelling or bruising at the site, or you develop a fever > 101? F after you are discharged home, call the office at once.  Special Instructions - You are still able to use cellular telephones; use the ear opposite the side where you have your pacemaker/defibrillator.  Avoid carrying your cellular phone near your device. - When traveling through airports, show security personnel your identification  card to avoid being screened in the metal detectors.  Ask the security personnel to use the hand wand. - Avoid arc welding equipment, MRI testing (magnetic resonance imaging),  TENS units (transcutaneous nerve stimulators).  Call the office for questions about other devices. - Avoid electrical appliances that are in poor condition or are not properly grounded. - Microwave ovens are safe to be near or to operate.

## 2015-05-02 NOTE — Evaluation (Signed)
Physical Therapy Evaluation Patient Details Name: [name] MRN: [id number] DOB: 1924/08/01 Today's Date: 05/02/2015   History of Present Illness  Pt s/p fall due to syncopal episode with resulant left femoral neck fracture, displaced. Now s/p left total hip arthroplasty, anterior approach and pacemaker. PMH significant for hypertension, hypothyroidism, anemia, CKD stage 3-4; PAF, GERD and chronic diastolic heart failure  Clinical Impression  Pt pleasant and moving well. Reports nausea with mobility but no orthostasis. Pt educated for direct anterior THA with WBAT as well as pacemaker restrictions. Pt independent and lives alone with hope to return to home with family assist but unsure if that is family plan as this point. Pt will benefit from acute therapy to maximize mobility, function and activity tolerance to decrease burden of care and adherence to precautions. Recommend daily mobility with nursing assist.     Follow Up Recommendations SNF;Supervision for mobility/OOB    Equipment Recommendations  Rolling walker with 5" wheels    Recommendations for Other Services       Precautions / Restrictions Precautions Precautions: Fall;ICD/Pacemaker Restrictions Weight Bearing Restrictions: Yes LUE Weight Bearing: Non weight bearing LLE Weight Bearing: Weight bearing as tolerated      Mobility  Bed Mobility Overal bed mobility: Needs Assistance Bed Mobility: Supine to Sit     Supine to sit: Min guard;HOB elevated (with VCs for technique)     General bed mobility comments: in chair on arrival  Transfers Overall transfer level: Needs assistance Equipment used: Rolling walker (2 wheeled) Transfers: Sit to/from Stand Sit to Stand: Min assist Stand pivot transfers: Min assist       General transfer comment: cues for hand placement and maintaining LUE at her side  Ambulation/Gait Ambulation/Gait assistance: Min guard Ambulation Distance (Feet): 110 Feet Assistive  device: Rolling walker (2 wheeled) Gait Pattern/deviations: Step-through pattern;Steppage   Gait velocity interpretation: Below normal speed for age/gender General Gait Details: cues for posture, position in Rw and decreasing excessive hip flexion LLE  Stairs            Wheelchair Mobility    Modified Rankin (Stroke Patients Only)       Balance Overall balance assessment: Needs assistance   Sitting balance-Leahy Scale: Good       Standing balance-Leahy Scale: Fair                               Pertinent Vitals/Pain Pain Assessment: No/denies pain  HR 80 sats 98% on RA BP 164/36    Home Living Family/patient expects to be discharged to:: Skilled nursing facility Living Arrangements: Alone Available Help at Discharge: Family Type of Home: House Home Access: Stairs to enter Entrance Stairs-Rails: None Entrance Stairs-Number of Steps: 1 Home Layout: One level Home Equipment: Bedside commode      Prior Function Level of Independence: Independent         Comments: including driving     Hand Dominance   Dominant Hand: Right    Extremity/Trunk Assessment   Upper Extremity Assessment: Defer to OT evaluation       LUE Deficits / Details: pacemaker placed    Lower Extremity Assessment: Overall WFL for tasks assessed;LLE deficits/detail   LLE Deficits / Details: pt reports decreased ability stepping with LLE and actually demonstrating steppage gait on LLE  Cervical / Trunk Assessment: Normal  Communication   Communication: No difficulties  Cognition Arousal/Alertness: Awake/alert Behavior During Therapy: WFL for tasks assessed/performed Overall Cognitive  Status: Within Functional Limits for tasks assessed       Memory: Decreased recall of precautions              General Comments      Exercises        Assessment/Plan    PT Assessment Patient needs continued PT services  PT Diagnosis Difficulty walking   PT Problem  List Decreased range of motion;Decreased activity tolerance;Decreased balance;Decreased mobility;Decreased knowledge of use of DME  PT Treatment Interventions Stair training;Gait training;DME instruction;Patient/family education;Functional mobility training;Therapeutic activities;Therapeutic exercise   PT Goals (Current goals can be found in the Care Plan section) Acute Rehab PT Goals Patient Stated Goal: return home and independence PT Goal Formulation: With patient Time For Goal Achievement: 05/16/15 Potential to Achieve Goals: Good    Frequency Min 5X/week   Barriers to discharge Decreased caregiver support children can assist but work during the day    Co-evaluation               End of Session Equipment Utilized During Treatment: Other (comment) (sling at rest, none with gait) Activity Tolerance: Patient tolerated treatment well Patient left: in chair;with call bell/phone within Scott;with nursing/sitter in room Nurse Communication: Mobility status;Precautions         Time: [id number] PT Time Calculation (min) (ACUTE ONLY): 19 min   Charges:   PT Evaluation $Initial PT Evaluation Tier I: 1 Procedure     PT G CodesMelford Scott 05/02/2015, 11:59 AM [name], [place]

## 2015-05-02 NOTE — Progress Notes (Signed)
Orthopedic Tech Progress Note Patient Details:  [name] 04/02/1924 [id number]  Patient ID: [name], female   DOB: 1924-05-04, 81 y.o.   MRN: [id number] Pt unable to use trapeze bar patient helper  Hildred Priest 05/02/2015, 8:50 AM

## 2015-05-02 NOTE — Progress Notes (Signed)
[facility] PROGRESS NOTE  [name] [id number] DOB: 19-Apr-1924 DOA: 04/28/2015 PCP: [name], MD  Assessment/Plan: 1-Syncope and collapse: appears to be cardiogenic in nature due to tachy-brady syndrome -patient can be transferred to telemetry bed if ok with cardiology -cardiology on board and patient s/p pacemaker implantation; will follow rec's -eliquis to be resumed today -will follow and replete electrolytes as needed   2-Hypothyroidism:  -TSH WNL -continue synthroid at current dose  3-Hypertensive heart disease: much better today -pain most likely contributing -will continue hydralazine, dose increased as per cardiology rec's -continue imdur -cardiology has now discontinue losartan and increased dose of amlodipine -HR has remained stable -will monitor and adjust meds further as needed  4-left hip fracture -s/p hip replacement on 6/20 -ortho on board, will follow post surgery rec's -eliquis will be resume and that will provide DVT prophylaxis  5-GERD (gastroesophageal reflux disease): continue PPI  6-anemia of chronic disease: slightly worse, most likely from hematoma on left hip after fall -will resume eliquis -will follow Hgb trend -CBC today with Hgb of 10.5 -no signs of overt bleeding  -status post 2 units of PRBC's on 6/19  7-chronic kidney disease, stage III:  -Cr essentially at baseline (normally Cr in 2.3-2.4 range); currently 2.0 -will monitor and follow renal function trend  8-hypertensive heart disease with chronic diastolic heart failure: compensated and stable -will follow daily weights -strict intake and output -heart healthy diet -no SOB at this moment and no LE swelling or JVD  9-IBS: will continue PRN levsin  Code Status: Full Family Communication: no family at bedside Disposition Plan: will most likely need SNF at discharge; will follow PT/OT rec's and orthopedic surgery rec's   Consultants:  Cardiology  Orthopedic  service   Procedures:  See below for x-ray reports   Hip replacement 6/20  Pacemaker implantation 6/20  Antibiotics:  None   HPI/Subjective: Feeling ok, no SOB and no fever. Mild Left hip pain with movement. No CP. Patient s/p hip replacement and pacemaker implantation. No acute distress. Hgb stable  Objective: Filed Vitals:   05/02/15 1629  BP:   Pulse: 63  Temp: 98 F (36.7 C)  Resp: 15    Intake/Output Summary (Last 24 hours) at 05/02/15 1645 Last data filed at 05/02/15 1636  Gross per 24 hour  Intake   2582 ml  Output   1150 ml  Net   1432 ml   Filed Weights   04/30/15 0500 05/01/15 0500 05/02/15 0444  Weight: 62 kg (136 lb 11 oz) 59.6 kg (131 lb 6.3 oz) 60.7 kg (133 lb 13.1 oz)    Exam:   General:  Afebrile, no SOB, patient with mild hip pain when moving her left leg (but controlled with current pain meds). No further episodes of lightheadedness and/or dizzy feeling. HR stable (sinus and paced per telemetry). BP better today. No CP  Cardiovascular: sinus bradycardia currently, positive SM, no rubs  Respiratory: no wheezing, no crackles; good air movement  Abdomen: soft, NT, ND, positive BS  Musculoskeletal: no edema or cyanosis   Data Reviewed: Basic Metabolic Panel:  Recent Labs Lab 04/28/15 1542 04/28/15 2123 04/30/15 0305 05/01/15 0021 05/02/15 0230  NA 143  --  137 136 133*  K 4.6  --  4.1 4.1 4.9  CL 103  --  100* 97* 99*  CO2 25  --  30 30 26   GLUCOSE 113*  --  101* 109* 198*  BUN 60*  --  48* 45* 41*  CREATININE 2.37*  --  2.25* 2.41* 2.00*  CALCIUM 8.5*  --  8.2* 8.0* 8.0*  MG  --  1.7  --   --   --   PHOS  --  4.1  --   --   --    Liver Function Tests:  Recent Labs Lab 04/28/15 1542  AST 31  ALT 32  ALKPHOS 69  BILITOT 0.8  PROT 6.2*  ALBUMIN 3.5   CBC:  Recent Labs Lab 04/28/15 1542 04/30/15 0305 05/01/15 0021 05/02/15 0230  WBC 5.9 4.3 6.9 8.2  NEUTROABS 4.0  --   --   --   HGB 9.1* 7.1* 10.6* 10.5*  HCT  27.9* 21.4* 31.2* 30.9*  MCV 85.8 85.3 84.1 84.7  PLT 199 134* 143* 126*   Cardiac Enzymes:  Recent Labs Lab 04/28/15 1542 04/30/15 0305  TROPONINI 0.04* 0.05*   CBG:  Recent Labs Lab 04/28/15 1659  GLUCAP 110*    Recent Results (from the past 240 hour(s))  MRSA PCR Screening     Status: None   Collection Time: 04/28/15  8:05 PM  Result Value Ref Range Status   MRSA by PCR NEGATIVE NEGATIVE Final    Comment:        The GeneXpert MRSA Assay (FDA approved for NASAL specimens only), is one component of a comprehensive MRSA colonization surveillance program. It is not intended to diagnose MRSA infection nor to guide or monitor treatment for MRSA infections.   Surgical pcr screen     Status: None   Collection Time: 04/29/15 10:17 PM  Result Value Ref Range Status   MRSA, PCR NEGATIVE NEGATIVE Final   Staphylococcus aureus NEGATIVE NEGATIVE Final    Comment:        The Xpert SA Assay (FDA approved for NASAL specimens in patients over 59 years of age), is one component of a comprehensive surveillance program.  Test performance has been validated by [facility] for patients greater than or equal to 63 year old. It is not intended to diagnose infection nor to guide or monitor treatment.      Studies: Pelvis Portable  05/02/2015   CLINICAL DATA:  Postoperative radiograph. Status post left hip arthroplasty. Initial encounter.  EXAM: PORTABLE PELVIS 1-2 VIEWS  COMPARISON:  Pelvis radiograph performed 04/28/2015  FINDINGS: The patient is status post left hip arthroplasty, without evidence of loosening or new fracture. Overlying soft tissue air is seen.  The right hip is unremarkable in appearance. The sacroiliac joints are within normal limits. The visualized bowel gas pattern is grossly unremarkable.  IMPRESSION: Status post left hip arthroplasty, without evidence of loosening or new fracture.   Electronically Signed   By: Garald Balding M.D.   On: 05/02/2015 01:10    Dg Chest Port 1 View  05/02/2015   CLINICAL DATA:  Shortness of breath.  Cardiac pacer.  EXAM: PORTABLE CHEST - 1 VIEW  COMPARISON:  04/28/2015.  FINDINGS: Mediastinum hilar structures are normal. Stable cardiomegaly with normal pulmonary vascularity. Cardiac pacer with lead tips in right atrium and right ventricle. No focal pulmonary infiltrate. No pleural effusion or pneumothorax.  IMPRESSION: 1. Interim placement of cardiac pacer. Lead tips in right atrium right ventricle. 2. Stable cardiomegaly.   Electronically Signed   By: Marcello Moores  Register   On: 05/02/2015 07:33   Dg Hip Operative Unilat With Pelvis Left  05/01/2015   CLINICAL DATA:  Left anterior hip arthroplasty.  EXAM: OPERATIVE LEFT HIP WITH PELVIS  COMPARISON:  04/28/2015  FINDINGS: Two intraoperative views. Left hip arthroplasty, without acute hardware complication or periprosthetic fracture.  IMPRESSION: Intraoperative imaging of left hip arthroplasty.   Electronically Signed   By: Abigail Miyamoto M.D.   On: 05/01/2015 19:43    Scheduled Meds: . amLODipine  5 mg Oral Daily  . apixaban  2.5 mg Oral BID  . docusate sodium  100 mg Oral BID  . hydrALAZINE  150 mg Oral 3 times per day  . isosorbide mononitrate  60 mg Oral Daily  . levothyroxine  88 mcg Oral QAC breakfast  . pantoprazole  40 mg Oral Daily  . senna  1 tablet Oral BID  . you have a pacemaker book   Does not apply Once   Continuous Infusions:    Principal Problem:   Syncope and collapse Active Problems:   Hypothyroidism   Hypertensive heart disease   GERD (gastroesophageal reflux disease)   LBBB (left bundle branch block)- new 11/01/13   Hypertensive heart/kidney disease w/chronic kidney disease stage IV   Systolic hypertension, isolated; difficult to control   Symptomatic bradycardia   Right bundle branch block (RBBB) intermittent    Fracture, femur, lt   Laceration of scalp   Chronic renal disease   Femoral neck fracture    Time spent: 35  minutes    Barton Dubois  [facility] Pager [phone]. If 7PM-7AM, please contact night-coverage at [email or web address], password [facility] 05/02/2015, 4:45 PM  LOS: 4 days

## 2015-05-02 NOTE — Progress Notes (Signed)
Patient ID: [name], female   DOB: [birth year], 81 y.o.   MRN: [id number]    SUBJECTIVE: The patient is doing well today.  At this time, she denies chest pain, shortness of breath, or any new concerns.  Marland Kitchen amLODipine  5 mg Oral Daily  . apixaban  2.5 mg Oral BID  . docusate sodium  100 mg Oral BID  . hydrALAZINE  150 mg Oral 3 times per day  . isosorbide mononitrate  60 mg Oral Daily  . levothyroxine  88 mcg Oral QAC breakfast  . pantoprazole  40 mg Oral Daily  . senna  1 tablet Oral BID  . you have a pacemaker book   Does not apply Once      OBJECTIVE: Physical Exam: Filed Vitals:   05/02/15 0500 05/02/15 0600 05/02/15 0700 05/02/15 0800  BP: 126/49 132/48 143/45   Pulse: 59 60 60   Temp:    97.9 F (36.6 C)  TempSrc:    Oral  Resp: 16 17 12    Height:      Weight:      SpO2: 100% 100% 100%     Intake/Output Summary (Last 24 hours) at 05/02/15 0809 Last data filed at 05/02/15 0600  Gross per 24 hour  Intake 2277.83 ml  Output   1250 ml  Net 1027.83 ml    Telemetry reveals sinus rhythm  GEN- The patient is well appearing, alert and oriented x 3 today.   Head- normocephalic, atraumatic Eyes-  Sclera clear, conjunctiva pink Ears- hearing intact Oropharynx- clear Neck- supple, no JVP Lungs- Clear to ausculation bilaterally, normal work of breathing, PM pocket bandaged Heart- Regular rate and rhythm, no murmurs, rubs or gallops, PMI not laterally displaced GI- soft, NT, ND, + BS Extremities- no clubbing, cyanosis, or edema Skin- no rash or lesion Psych- euthymic mood, full affect Neuro- strength and sensation are intact  LABS: Basic Metabolic Panel:  Recent Labs  05/01/15 0021 05/02/15 0230  NA 136 133*  K 4.1 4.9  CL 97* 99*  CO2 30 26  GLUCOSE 109* 198*  BUN 45* 41*  CREATININE 2.41* 2.00*  CALCIUM 8.0* 8.0*   Liver Function Tests: No results for input(s): AST, ALT, ALKPHOS, BILITOT, PROT, ALBUMIN in the last 72 hours. No results for  input(s): LIPASE, AMYLASE in the last 72 hours. CBC:  Recent Labs  05/01/15 0021 05/02/15 0230  WBC 6.9 8.2  HGB 10.6* 10.5*  HCT 31.2* 30.9*  MCV 84.1 84.7  PLT 143* 126*   Cardiac Enzymes:  Recent Labs  04/30/15 0305  TROPONINI 0.05*   BNP: Invalid input(s): POCBNP D-Dimer: No results for input(s): DDIMER in the last 72 hours. Hemoglobin A1C: No results for input(s): HGBA1C in the last 72 hours. Fasting Lipid Panel: No results for input(s): CHOL, HDL, LDLCALC, TRIG, CHOLHDL, LDLDIRECT in the last 72 hours. Thyroid Function Tests: No results for input(s): TSH, T4TOTAL, T3FREE, THYROIDAB in the last 72 hours.  Invalid input(s): FREET3 Anemia Panel: No results for input(s): VITAMINB12, FOLATE, FERRITIN, TIBC, IRON, RETICCTPCT in the last 72 hours.  RADIOLOGY: Ct Head Wo Contrast  04/28/2015   CLINICAL DATA:  Syncope, head laceration after fall. Positive loss consciousness.  EXAM: CT HEAD WITHOUT CONTRAST  CT CERVICAL SPINE WITHOUT CONTRAST  TECHNIQUE: Multidetector CT imaging of the head and cervical spine was performed following the standard protocol without intravenous contrast. Multiplanar CT image reconstructions of the cervical spine were also generated.  COMPARISON:  CT scan of November 19, 2014.  FINDINGS: CT HEAD FINDINGS  Bony calvarium appears intact. Mild diffuse cortical atrophy is noted. No mass effect or midline shift is noted. Ventricular size is within normal limits. There is no evidence of mass lesion, hemorrhage or acute infarction.  CT CERVICAL SPINE FINDINGS  No fracture or spondylolisthesis is noted. Severe degenerative disc disease is noted at C4-5, C5-6 and C6-7 with anterior osteophyte formation. Posterior facet joints appear intact. Visualized lung apices appear normal.  IMPRESSION: Mild diffuse cortical atrophy. No acute intracranial abnormality seen.  Severe multilevel degenerative disc disease seen in cervical spine. No fracture or spondylolisthesis is  noted.   Electronically Signed   By: Marijo Conception, M.D.   On: 04/28/2015 16:39   Ct Cervical Spine Wo Contrast  04/28/2015   CLINICAL DATA:  Syncope, head laceration after fall. Positive loss consciousness.  EXAM: CT HEAD WITHOUT CONTRAST  CT CERVICAL SPINE WITHOUT CONTRAST  TECHNIQUE: Multidetector CT imaging of the head and cervical spine was performed following the standard protocol without intravenous contrast. Multiplanar CT image reconstructions of the cervical spine were also generated.  COMPARISON:  CT scan of November 19, 2014.  FINDINGS: CT HEAD FINDINGS  Bony calvarium appears intact. Mild diffuse cortical atrophy is noted. No mass effect or midline shift is noted. Ventricular size is within normal limits. There is no evidence of mass lesion, hemorrhage or acute infarction.  CT CERVICAL SPINE FINDINGS  No fracture or spondylolisthesis is noted. Severe degenerative disc disease is noted at C4-5, C5-6 and C6-7 with anterior osteophyte formation. Posterior facet joints appear intact. Visualized lung apices appear normal.  IMPRESSION: Mild diffuse cortical atrophy. No acute intracranial abnormality seen.  Severe multilevel degenerative disc disease seen in cervical spine. No fracture or spondylolisthesis is noted.   Electronically Signed   By: Marijo Conception, M.D.   On: 04/28/2015 16:39   Pelvis Portable  05/02/2015   CLINICAL DATA:  Postoperative radiograph. Status post left hip arthroplasty. Initial encounter.  EXAM: PORTABLE PELVIS 1-2 VIEWS  COMPARISON:  Pelvis radiograph performed 04/28/2015  FINDINGS: The patient is status post left hip arthroplasty, without evidence of loosening or new fracture. Overlying soft tissue air is seen.  The right hip is unremarkable in appearance. The sacroiliac joints are within normal limits. The visualized bowel gas pattern is grossly unremarkable.  IMPRESSION: Status post left hip arthroplasty, without evidence of loosening or new fracture.   Electronically  Signed   By: Garald Balding M.D.   On: 05/02/2015 01:10   Dg Pelvis Portable  04/28/2015   CLINICAL DATA:  Recent syncopal episode with fall, pelvic pain  EXAM: PORTABLE PELVIS 1-2 VIEWS  COMPARISON:  02/26/2013  FINDINGS: The pelvic ring appears intact. Angulation along the superior aspect of the femoral head is noted with increased density along the femoral neck at the junction with the femoral head. This is highly suspicious for a mildly impacted fracture. Dedicated hip films or limited CT of the pelvis may be helpful. Degenerative changes of the lumbar spine are noted.  IMPRESSION: Changes in the proximal left femur as described suggestive of mildly impacted fracture.   Electronically Signed   By: Inez Catalina M.D.   On: 04/28/2015 16:37   Dg Chest Port 1 View  05/02/2015   CLINICAL DATA:  Shortness of breath.  Cardiac pacer.  EXAM: PORTABLE CHEST - 1 VIEW  COMPARISON:  04/28/2015.  FINDINGS: Mediastinum hilar structures are normal. Stable cardiomegaly with normal pulmonary vascularity. Cardiac pacer with lead tips  in right atrium and right ventricle. No focal pulmonary infiltrate. No pleural effusion or pneumothorax.  IMPRESSION: 1. Interim placement of cardiac pacer. Lead tips in right atrium right ventricle. 2. Stable cardiomegaly.   Electronically Signed   By: Marcello Moores  Register   On: 05/02/2015 07:33   Dg Chest Port 1 View  04/28/2015   CLINICAL DATA:  Fall, syncope, bradycardia  EXAM: PORTABLE CHEST - 1 VIEW  COMPARISON:  07/29/2014  FINDINGS: Pacing pads overlie the chest. Implanted monitor device projects over the left hemithorax. Moderate enlargement of the cardiomediastinal silhouette is noted without evidence for edema. No displaced rib fracture allowing for technique. No focal pulmonary opacity. No supine evidence for pneumothorax. No pleural effusion. Presumed skin fold artifact simulating pneumothorax at the right lung base on repeat image 2.  IMPRESSION: Cardiomegaly without focal acute  finding.   Electronically Signed   By: Conchita Paris M.D.   On: 04/28/2015 16:40   Dg Hip Operative Unilat With Pelvis Left  05/01/2015   CLINICAL DATA:  Left anterior hip arthroplasty.  EXAM: OPERATIVE LEFT HIP WITH PELVIS  COMPARISON:  04/28/2015  FINDINGS: Two intraoperative views. Left hip arthroplasty, without acute hardware complication or periprosthetic fracture.  IMPRESSION: Intraoperative imaging of left hip arthroplasty.   Electronically Signed   By: Abigail Miyamoto M.D.   On: 05/01/2015 19:43   Dg Femur Min 2 Views Left  04/28/2015   CLINICAL DATA:  Closed left hip fracture, initial encounter.  EXAM: LEFT FEMUR 2 VIEWS  COMPARISON:  None.  FINDINGS: Findings consistent with mildly displaced proximal left femoral neck fracture. This appears closed and posttraumatic. Remaining portion of femur appears normal.  IMPRESSION: Probable mildly displaced proximal left femoral neck fracture.   Electronically Signed   By: Marijo Conception, M.D.   On: 04/28/2015 18:12    ASSESSMENT AND PLAN:  Principal Problem:   Syncope and collapse Active Problems:   Hypothyroidism   Hypertensive heart disease   GERD (gastroesophageal reflux disease)   LBBB (left bundle branch block)- new 11/01/13   Hypertensive heart/kidney disease w/chronic kidney disease stage IV   Systolic hypertension, isolated; difficult to control   Symptomatic bradycardia   Right bundle branch block (RBBB) intermittent    Fracture, femur, lt   Laceration of scalp   Chronic renal disease   Femoral neck fracture Plan - her device interrogation and cxr are satisfactory. She is s/p hip replacement. Ok to proceed with rehab. No limitations. We will arrange followup as an outpatient.   Cristopher Peru, MD 05/02/2015 8:09 AM

## 2015-05-02 NOTE — Progress Notes (Signed)
   Subjective:  Patient reports pain as mild.  No c/o. Ambulated in the hall with PT.  Objective:   VITALS:   Filed Vitals:   05/02/15 1700 05/02/15 1800 05/02/15 1900 05/02/15 1951  BP: 144/40 151/25 141/34   Pulse: 61 66 62   Temp:    98 F (36.7 C)  TempSrc:    Oral  Resp: 15 16 10    Height:      Weight:      SpO2: 99% 99% 99%     ABD soft Sensation intact distally Intact pulses distally Dorsiflexion/Plantar flexion intact Incision: dressing C/D/I Compartment soft   Lab Results  Component Value Date   WBC 8.2 05/02/2015   HGB 10.5* 05/02/2015   HCT 30.9* 05/02/2015   MCV 84.7 05/02/2015   PLT 126* 05/02/2015   BMET    Component Value Date/Time   NA 133* 05/02/2015 0230   K 4.9 05/02/2015 0230   CL 99* 05/02/2015 0230   CO2 26 05/02/2015 0230   GLUCOSE 198* 05/02/2015 0230   BUN 41* 05/02/2015 0230   CREATININE 2.00* 05/02/2015 0230   CREATININE 2.09* 10/03/2014 1344   CALCIUM 8.0* 05/02/2015 0230   GFRNONAA 21* 05/02/2015 0230   GFRAA 24* 05/02/2015 0230     Assessment/Plan: 1 Day Post-Op   Principal Problem:   Syncope and collapse Active Problems:   Hypothyroidism   Hypertensive heart disease   GERD (gastroesophageal reflux disease)   LBBB (left bundle branch block)- new 11/01/13   Hypertensive heart/kidney disease w/chronic kidney disease stage IV   Systolic hypertension, isolated; difficult to control   Symptomatic bradycardia   Right bundle branch block (RBBB) intermittent    Fracture, femur, lt   Laceration of scalp   Chronic renal disease   Femoral neck fracture   WBAT with walker DVT ppx: apixiban low dose PT/OT PO pain control Dispo: pending PT/OT eval   Helyne Genther, Horald Pollen 05/02/2015, 8:08 PM   Rod Can, MD Cell [phone]

## 2015-05-02 NOTE — Clinical Social Work Placement (Signed)
   CLINICAL SOCIAL WORK PLACEMENT  NOTE  Date:  05/02/2015  Patient Details  Name: [name] MRN: [id number] Date of Birth: 05-30-1924  Clinical Social Work is seeking post-discharge placement for this patient at the [place] level of care (*CSW will initial, date and re-position this form in  chart as items are completed):  Yes   Patient/family provided with [place] Work Department's list of facilities offering this level of care within the geographic area requested by the patient (or if unable, by the patient's family).  Yes   Patient/family informed of their freedom to choose among providers that offer the needed level of care, that participate in Medicare, Medicaid or managed care program needed by the patient, have an available bed and are willing to accept the patient.  Yes   Patient/family informed of [facility]'s ownership interest in [facility] and [facility], as well as of the fact that they are under no obligation to receive care at these facilities.  PASRR submitted to EDS on 05/02/15     PASRR number received on 05/02/15     Existing PASRR number confirmed on       FL2 transmitted to all facilities in geographic area requested by pt/family on 05/02/15     FL2 transmitted to all facilities within larger geographic area on       Patient informed that his/her managed care company has contracts with or will negotiate with certain facilities, including the following:            Patient/family informed of bed offers received.  Patient chooses bed at       Physician recommends and patient chooses bed at      Patient to be transferred to   on  .  Patient to be transferred to facility by       Patient family notified on   of transfer.  Name of family member notified:        PHYSICIAN Please sign FL2     Additional Comment:    _______________________________________________ Berton Mount, [place]

## 2015-05-03 DIAGNOSIS — S7292XD Unspecified fracture of left femur, subsequent encounter for closed fracture with routine healing: Secondary | ICD-10-CM

## 2015-05-03 DIAGNOSIS — Z950 Presence of cardiac pacemaker: Secondary | ICD-10-CM | POA: Insufficient documentation

## 2015-05-03 DIAGNOSIS — Z959 Presence of cardiac and vascular implant and graft, unspecified: Secondary | ICD-10-CM

## 2015-05-03 LAB — CBC
HCT: 23.2 % — ABNORMAL LOW (ref 36.0–46.0)
Hemoglobin: 7.9 g/dL — ABNORMAL LOW (ref 12.0–15.0)
MCH: 29.5 pg (ref 26.0–34.0)
MCHC: 34.1 g/dL (ref 30.0–36.0)
MCV: 86.6 fL (ref 78.0–100.0)
PLATELETS: 130 10*3/uL — AB (ref 150–400)
RBC: 2.68 MIL/uL — ABNORMAL LOW (ref 3.87–5.11)
RDW: 17.2 % — AB (ref 11.5–15.5)
WBC: 6.7 10*3/uL (ref 4.0–10.5)

## 2015-05-03 LAB — BASIC METABOLIC PANEL
ANION GAP: 10 (ref 5–15)
BUN: 50 mg/dL — ABNORMAL HIGH (ref 6–20)
CALCIUM: 8 mg/dL — AB (ref 8.9–10.3)
CO2: 27 mmol/L (ref 22–32)
Chloride: 96 mmol/L — ABNORMAL LOW (ref 101–111)
Creatinine, Ser: 2.58 mg/dL — ABNORMAL HIGH (ref 0.44–1.00)
GFR, EST AFRICAN AMERICAN: 18 mL/min — AB (ref >60)
GFR, EST NON AFRICAN AMERICAN: 16 mL/min — AB (ref >60)
GLUCOSE: 107 mg/dL — AB (ref 65–99)
POTASSIUM: 4.9 mmol/L (ref 3.5–5.1)
Sodium: 133 mmol/L — ABNORMAL LOW (ref 135–145)

## 2015-05-03 MED ORDER — TEMAZEPAM 30 MG PO CAPS: 30.0000 mg | ORAL_CAPSULE | Freq: Every evening | ORAL | Status: DC | PRN

## 2015-05-03 MED ORDER — HYDROCODONE-ACETAMINOPHEN 5-325 MG PO TABS: 1.0000 | ORAL_TABLET | Freq: Four times a day (QID) | ORAL | Status: DC | PRN

## 2015-05-03 MED ORDER — LORAZEPAM 0.5 MG PO TABS: 0.5000 mg | ORAL_TABLET | Freq: Three times a day (TID) | ORAL | Status: DC | PRN

## 2015-05-03 MED ORDER — APIXABAN 2.5 MG PO TABS: 2.5000 mg | ORAL_TABLET | Freq: Two times a day (BID) | ORAL | Status: DC

## 2015-05-03 MED ORDER — AMLODIPINE BESYLATE 5 MG PO TABS: 5.0000 mg | ORAL_TABLET | Freq: Every day | ORAL | Status: DC

## 2015-05-03 MED ORDER — POLYSACCHARIDE IRON COMPLEX 150 MG PO CAPS: 150.0000 mg | ORAL_CAPSULE | Freq: Two times a day (BID) | ORAL | Status: DC

## 2015-05-03 MED ORDER — FUROSEMIDE 40 MG PO TABS: 40.0000 mg | ORAL_TABLET | Freq: Every day | ORAL | Status: DC | PRN

## 2015-05-03 MED ORDER — DOCUSATE SODIUM 100 MG PO CAPS: 100.0000 mg | ORAL_CAPSULE | Freq: Two times a day (BID) | ORAL | Status: DC

## 2015-05-03 MED ORDER — HYDRALAZINE HCL 50 MG PO TABS: 150.0000 mg | ORAL_TABLET | Freq: Three times a day (TID) | ORAL | Status: DC

## 2015-05-03 NOTE — Progress Notes (Signed)
Physical Therapy Treatment Patient Details Name: [name] MRN: [id number] DOB: 1924/03/21 Today's Date: 05/03/2015    History of Present Illness Pt s/p fall due to syncopal episode with resulant left femoral neck fracture, displaced. Now s/p left total hip arthroplasty, anterior approach and pacemaker. PMH significant for hypertension, hypothyroidism, anemia, CKD stage 3-4; PAF, GERD and chronic diastolic heart failure    PT Comments    Pt progressing towards physical therapy goals. Pt reports she was sitting up for 6.5 hours in recliner and was fatigued during session. Despite fatigue, pt did well with mobility, requiring assist only to elevate LLE up into bed for sit>supine. Pt demonstrated good balance with standing peri-care at [facility]. Will continue to follow and progress as able per POC.   Follow Up Recommendations  SNF;Supervision for mobility/OOB     Equipment Recommendations  Rolling walker with 5" wheels    Recommendations for Other Services       Precautions / Restrictions Precautions Precautions: Fall;ICD/Pacemaker Restrictions Weight Bearing Restrictions: Yes LUE Weight Bearing: Non weight bearing LLE Weight Bearing: Weight bearing as tolerated    Mobility  Bed Mobility Overal bed mobility: Needs Assistance Bed Mobility: Sit to Supine       Sit to supine: Min assist   General bed mobility comments: Assist to elevate LLE up into the bed.   Transfers Overall transfer level: Needs assistance Equipment used: Rolling walker (2 wheeled) Transfers: Sit to/from [facility] Sit to Stand: Min assist Stand pivot transfers: Min guard       General transfer comment: VC's for hand placement on seated surface for safety. Pt was able to power-up to full stand with min assist at the gait belt. Once standing, pt was able to take pivotal steps around to/from [facility] without physical assistance.   Ambulation/Gait Ambulation/Gait assistance: Min  guard Ambulation Distance (Feet): 150 Feet Assistive device: Rolling walker (2 wheeled) Gait Pattern/deviations: Step-through pattern;Decreased stride length;Trunk flexed Gait velocity: Decreased Gait velocity interpretation: Below normal speed for age/gender General Gait Details: cues for posture, position in Rw and decreasing excessive hip flexion LLE   Stairs            Wheelchair Mobility    Modified Rankin (Stroke Patients Only)       Balance Overall balance assessment: Needs assistance Sitting-balance support: No upper extremity supported;Feet supported Sitting balance-Leahy Scale: Good     Standing balance support: No upper extremity supported Standing balance-Leahy Scale: Fair Standing balance comment: Pt was able to stand at the [facility] to perform peri-care without UE support. No unsteadiness noted.                     Cognition Arousal/Alertness: Awake/alert Behavior During Therapy: WFL for tasks assessed/performed Overall Cognitive Status: Within Functional Limits for tasks assessed       Memory: Decreased recall of precautions              Exercises      General Comments        Pertinent Vitals/Pain Pain Assessment: No/denies pain    Home Living                      Prior Function            PT Goals (current goals can now be found in the care plan section) Acute Rehab PT Goals Patient Stated Goal: One more day before d/c PT Goal Formulation: With patient Time For Goal Achievement: 05/16/15  Potential to Achieve Goals: Good Progress towards PT goals: Progressing toward goals    Frequency  Min 5X/week    PT Plan Current plan remains appropriate    Co-evaluation             End of Session Equipment Utilized During Treatment: Gait belt Activity Tolerance: Patient tolerated treatment well Patient left: in chair;with call bell/phone within reach;with nursing/sitter in room     Time: 1432-1500 PT Time  Calculation (min) (ACUTE ONLY): 28 min  Charges:  $Gait Training: 8-22 mins $Therapeutic Activity: 8-22 mins                    G Codes:      Rolinda Roan 05/18/15, 3:09 PM   Rolinda Roan, PT, DPT Acute Rehabilitation Services Pager: [phone]

## 2015-05-03 NOTE — Clinical Social Work Placement (Signed)
   CLINICAL SOCIAL WORK PLACEMENT  NOTE  Date:  05/03/2015  Patient Details  Name: SHAYLIE ESSENMACHER MRN: [id number] Date of Birth: [birth year]  Clinical Social Work is seeking post-discharge placement for this patient at the [place] level of care (*CSW will initial, date and re-position this form in  chart as items are completed):  Yes   Patient/family provided with [place] Work Department's list of facilities offering this level of care within the geographic area requested by the patient (or if unable, by the patient's family).  Yes   Patient/family informed of their freedom to choose among providers that offer the needed level of care, that participate in Medicare, Medicaid or managed care program needed by the patient, have an available bed and are willing to accept the patient.  Yes   Patient/family informed of [facility]'s ownership interest in [facility] and [facility], as well as of the fact that they are under no obligation to receive care at these facilities.  PASRR submitted to EDS on 05/02/15     PASRR number received on 05/02/15     Existing PASRR number confirmed on       FL2 transmitted to all facilities in geographic area requested by pt/family on 05/02/15     FL2 transmitted to all facilities within larger geographic area on       Patient informed that his/her managed care company has contracts with or will negotiate with certain facilities, including the following:        Yes   Patient/family informed of bed offers received.  Patient chooses bed at [facility]     Physician recommends and patient chooses bed at      Patient to be transferred to [facility] on 05/03/15.  Patient to be transferred to facility by [facility]     Patient family notified on 05/03/15 of transfer.  Name of family member notified:   Arienne Deur (daughter-in-law))     PHYSICIAN       Additional Comment:    [place]erton Mount,  [employer]

## 2015-05-03 NOTE — Discharge Summary (Signed)
Physician Discharge Summary  [name] [id number] DOB: Oct 08, 1924 DOA: 04/28/2015  PCP: [name], MD  Admit date: 04/28/2015 Discharge date: 05/03/2015  Time spent: >30 minutes  Recommendations for Outpatient Follow-up:  CBC in 5 days to follow Hgb trend (goal is to maintain Hgb above 7; transfuse as needed) Check BMET to follow electrolytes and renal function Reassess BP and adjust medications as needed  Discharge Diagnoses:  Principal Problem:   Syncope and collapse Active Problems:   Hypothyroidism   Hypertensive heart disease   GERD (gastroesophageal reflux disease)   LBBB (left bundle branch block)- new 11/01/13   Hypertensive heart/kidney disease w/chronic kidney disease stage IV   Systolic hypertension, isolated; difficult to control   Symptomatic bradycardia   Right bundle branch block (RBBB) intermittent    Fracture, femur, lt   Laceration of scalp   Chronic renal disease   Femoral neck fracture   Discharge Condition: stable and improved. Discharge to SNF for rehabilitation. Outpatient follow up with cardiology service and orthopedic service   Diet recommendation: heart healthy diet  Filed Weights   05/01/15 0500 05/02/15 0444 05/03/15 0332  Weight: 59.6 kg (131 lb 6.3 oz) 60.7 kg (133 lb 13.1 oz) 61.4 kg (135 lb 5.8 oz)    History of present illness:  81 y.o. female with PMH significant for hypertension, hypothyroidism, anemia, CKD stage 3-4; PAF (on eliquis), GERD and chronic diastolic heart failure; presented to ED after she experienced an episode of syncope/ patient was in usual health and went shopping, at the store she experienced and episode of syncope and collapse; there was no prodromic symptoms according to patient. No post ictal episodes after conscious regained. Patient didn't experienced tongue bite, urinary incontinence or fecal incontinence. Patient in route to ED experienced transient junctional rhythm with HR down into the 20'-30's and  felt dizzy. In ED she had another episode of junctional rhythm with LBBB. As part of work up from fall CT head was ordered and demonstrated no intracranial abnormalities. Patient endorses pain with movement of left hip and x-ray demonstrated left hip fracture. Of Note, Patient denies CP, SOB, fever, chills, abd pain, dysuria, nausea, vomiting or any other complaints. Cardiology and orthopedic service consulted, [facility] called to admit patient for further evaluation and treatment.  Hospital Course:  1-Syncope and collapse: appears to be cardiogenic in nature due to tachy-brady syndrome -patient would be discharge to SNF for rehab -cardiology on board and patient s/p pacemaker implantation; will follow with them in outpatient setting -eliquis resumed on 6/21 prior to discharge -Follow and replete electrolytes as needed   2-Hypothyroidism:  -TSH WNL -continue synthroid at current dose  3-Hypertensive heart disease: much better today -pain most likely contributing -cardiology has now discontinue losartan (due to renal failure), will discharge on imdur and increased dosages of amlodipine and hydralazine -HR has remained stable -will monitor and adjust meds further as needed  4-left hip fracture -s/p hip replacement on 6/20 -ortho on board, patient to follow with them in 2 weeks after discharge -eliquis resumed and that will provide DVT prophylaxis  5-GERD (gastroesophageal reflux disease): continue PPI  6-anemia of chronic disease: slightly worse, most likely from hematoma on left hip after fall and surgical interventions -eliquis resumed -will need close follow Hgb trend -CBC with Hgb at discharge 7.9 -no signs of overt bleeding  -status post 2 units of PRBC's on 6/19 -will discharge on niferex BID -transfusion if Hgb < 2  7-chronic kidney disease, stage III:  -Cr essentially  at baseline (normally Cr in 2.3-2.4 range) -at discharge 2.5 -please follow renal function  trend  8-hypertensive heart disease with chronic diastolic heart failure: compensated and stable -will encourage follow daily weights -strict intake and output -heart healthy diet -no SOB at this moment and no LE swelling or JVD  9-IBS: will continue PRN levsin and PPI  Procedures:  See below for x-ray reports   Hip replacement 6/20  Pacemaker implantation 6/20  Consultations:  Orthopedic service  Cardiology   Discharge Exam: Filed Vitals:   05/03/15 1207  BP: 102/43  Pulse:   Temp: 98.1 F (36.7 C)  Resp: 17    General: Afebrile, no SOB, patient with mild hip pain when moving her left leg (but controlled with current pain meds). No further episodes of lightheadedness and/or dizzy feeling. HR stable (sinus and paced per telemetry). No CP  Cardiovascular: sinus bradycardia currently, positive SM, no rubs  Respiratory: no wheezing, no crackles; good air movement  Abdomen: soft, NT, ND, positive BS  Musculoskeletal: no edema or cyanosis  Discharge Instructions   Discharge Instructions    Diet - low sodium heart healthy    Complete by:  As directed      Discharge instructions    Complete by:  As directed   Heart healthy diet (less than 2.5 gram daily) PT/OT as per SNF rehab protocol Arrange follow up in 2 weeks with orthopedic service Cardiology service will contact patient with appointment details. Maintain adequate hydration Take medications as prescribed          Current Discharge Medication List    START taking these medications   Details  amLODipine (NORVASC) 5 MG tablet Take 1 tablet (5 mg total) by mouth daily.    docusate sodium (COLACE) 100 MG capsule Take 1 capsule (100 mg total) by mouth 2 (two) times daily. Qty: 10 capsule, Refills: 0    HYDROcodone-acetaminophen (NORCO/VICODIN) 5-325 MG per tablet Take 1-2 tablets by mouth every 6 (six) hours as needed for moderate pain. Qty: 30 tablet, Refills: 0    iron polysaccharides (NIFEREX)  150 MG capsule Take 1 capsule (150 mg total) by mouth 2 (two) times daily.      CONTINUE these medications which have CHANGED   Details  apixaban (ELIQUIS) 2.5 MG TABS tablet Take 1 tablet (2.5 mg total) by mouth 2 (two) times daily.    furosemide (LASIX) 40 MG tablet Take 1 tablet (40 mg total) by mouth daily as needed for fluid (more than 2-3 pounds overnight and/or 5 pounds in a week).    hydrALAZINE (APRESOLINE) 50 MG tablet Take 3 tablets (150 mg total) by mouth every 8 (eight) hours.    LORazepam (ATIVAN) 0.5 MG tablet Take 1 tablet (0.5 mg total) by mouth every 8 (eight) hours as needed for anxiety. Qty: 30 tablet, Refills: 0    temazepam (RESTORIL) 30 MG capsule Take 1 capsule (30 mg total) by mouth at bedtime as needed for sleep. Qty: 30 capsule, Refills: 0      CONTINUE these medications which have NOT CHANGED   Details  [employer] VITAMIN B12 1000 MCG tablet TAKE 1 TABLET BY MOUTH EVERY DAY Qty: 30 tablet, Refills: 6    hyoscyamine (LEVSIN, ANASPAZ) 0.125 MG tablet Take 1-2 tablets by mouth or under tongue every 4 hours as needed before meals Qty: 360 tablet, Refills: 0    isosorbide mononitrate (IMDUR) 60 MG 24 hr tablet TAKE 1 TABLET (60 MG TOTAL) BY MOUTH DAILY. Qty: 30 tablet, Refills:  10    levothyroxine (SYNTHROID, LEVOTHROID) 88 MCG tablet TAKE 1 TABLET (88 MCG TOTAL) BY MOUTH DAILY BEFORE BREAKFAST. Qty: 90 tablet, Refills: 1    nitroGLYCERIN (NITROSTAT) 0.4 MG SL tablet Place 1 tablet (0.4 mg total) under the tongue every 5 (five) minutes x 3 doses as needed for chest pain. Qty: 25 tablet, Refills: 3    PROAIR HFA 108 (90 BASE) MCG/ACT inhaler Inhale 2 puffs into the lungs every 4 (four) hours as needed. Severe cough/wheeze Refills: 0    omeprazole (PRILOSEC) 40 MG capsule Take 1 capsule (40 mg total) by mouth daily. Qty: 30 capsule, Refills: 11      STOP taking these medications     amiodarone (PACERONE) 200 MG tablet      predniSONE (DELTASONE) 10 MG  tablet        Allergies  Allergen Reactions  . Ambien [Zolpidem Tartrate]     " made her crazy "  . Amoxicillin     REACTION: unspecified  . Ceftin [Cefuroxime Axetil] Diarrhea  . Codeine Phosphate     REACTION: unspecified  . Colchicine     Severe diarrhea  . Lipitor [Atorvastatin] Other (See Comments)    "makes legs jump all night"  . Penicillins     REACTION: nausea, swelling  . Xifaxan [Rifaximin] Other (See Comments)    unknown   Follow-up Information    Follow up with Swinteck, Horald Pollen, MD. Schedule an appointment as soon as possible for a visit in 2 weeks.   Specialty:  Orthopedic Surgery   Why:  For wound re-check   Contact information:   [address] [place] [address] [phone]       Follow up with [facility] On 05/11/2015.   Why:  at [facility] for wound check   Contact information:   [address] [place] [phone]        The results of significant diagnostics from this hospitalization (including imaging, microbiology, ancillary and laboratory) are listed below for reference.    Significant Diagnostic Studies: Ct Head Wo Contrast  04/28/2015   CLINICAL DATA:  Syncope, head laceration after fall. Positive loss consciousness.  EXAM: CT HEAD WITHOUT CONTRAST  CT CERVICAL SPINE WITHOUT CONTRAST  TECHNIQUE: Multidetector CT imaging of the head and cervical spine was performed following the standard protocol without intravenous contrast. Multiplanar CT image reconstructions of the cervical spine were also generated.  COMPARISON:  CT scan of November 19, 2014.  FINDINGS: CT HEAD FINDINGS  Bony calvarium appears intact. Mild diffuse cortical atrophy is noted. No mass effect or midline shift is noted. Ventricular size is within normal limits. There is no evidence of mass lesion, hemorrhage or acute infarction.  CT CERVICAL SPINE FINDINGS  No fracture or spondylolisthesis is noted. Severe degenerative disc disease  is noted at C4-5, C5-6 and C6-7 with anterior osteophyte formation. Posterior facet joints appear intact. Visualized lung apices appear normal.  IMPRESSION: Mild diffuse cortical atrophy. No acute intracranial abnormality seen.  Severe multilevel degenerative disc disease seen in cervical spine. No fracture or spondylolisthesis is noted.   Electronically Signed   By: Marijo Conception, M.D.   On: 04/28/2015 16:39   Ct Cervical Spine Wo Contrast  04/28/2015   CLINICAL DATA:  Syncope, head laceration after fall. Positive loss consciousness.  EXAM: CT HEAD WITHOUT CONTRAST  CT CERVICAL SPINE WITHOUT CONTRAST  TECHNIQUE: Multidetector CT imaging of the head and cervical spine was performed  following the standard protocol without intravenous contrast. Multiplanar CT image reconstructions of the cervical spine were also generated.  COMPARISON:  CT scan of November 19, 2014.  FINDINGS: CT HEAD FINDINGS  Bony calvarium appears intact. Mild diffuse cortical atrophy is noted. No mass effect or midline shift is noted. Ventricular size is within normal limits. There is no evidence of mass lesion, hemorrhage or acute infarction.  CT CERVICAL SPINE FINDINGS  No fracture or spondylolisthesis is noted. Severe degenerative disc disease is noted at C4-5, C5-6 and C6-7 with anterior osteophyte formation. Posterior facet joints appear intact. Visualized lung apices appear normal.  IMPRESSION: Mild diffuse cortical atrophy. No acute intracranial abnormality seen.  Severe multilevel degenerative disc disease seen in cervical spine. No fracture or spondylolisthesis is noted.   Electronically Signed   By: Marijo Conception, M.D.   On: 04/28/2015 16:39   Pelvis Portable  05/02/2015   CLINICAL DATA:  Postoperative radiograph. Status post left hip arthroplasty. Initial encounter.  EXAM: PORTABLE PELVIS 1-2 VIEWS  COMPARISON:  Pelvis radiograph performed 04/28/2015  FINDINGS: The patient is status post left hip arthroplasty, without evidence  of loosening or new fracture. Overlying soft tissue air is seen.  The right hip is unremarkable in appearance. The sacroiliac joints are within normal limits. The visualized bowel gas pattern is grossly unremarkable.  IMPRESSION: Status post left hip arthroplasty, without evidence of loosening or new fracture.   Electronically Signed   By: Garald Balding M.D.   On: 05/02/2015 01:10   Dg Pelvis Portable  04/28/2015   CLINICAL DATA:  Recent syncopal episode with fall, pelvic pain  EXAM: PORTABLE PELVIS 1-2 VIEWS  COMPARISON:  02/26/2013  FINDINGS: The pelvic ring appears intact. Angulation along the superior aspect of the femoral head is noted with increased density along the femoral neck at the junction with the femoral head. This is highly suspicious for a mildly impacted fracture. Dedicated hip films or limited CT of the pelvis may be helpful. Degenerative changes of the lumbar spine are noted.  IMPRESSION: Changes in the proximal left femur as described suggestive of mildly impacted fracture.   Electronically Signed   By: Inez Catalina M.D.   On: 04/28/2015 16:37   Dg Chest Port 1 View  05/02/2015   CLINICAL DATA:  Shortness of breath.  Cardiac pacer.  EXAM: PORTABLE CHEST - 1 VIEW  COMPARISON:  04/28/2015.  FINDINGS: Mediastinum hilar structures are normal. Stable cardiomegaly with normal pulmonary vascularity. Cardiac pacer with lead tips in right atrium and right ventricle. No focal pulmonary infiltrate. No pleural effusion or pneumothorax.  IMPRESSION: 1. Interim placement of cardiac pacer. Lead tips in right atrium right ventricle. 2. Stable cardiomegaly.   Electronically Signed   By: Marcello Moores  Register   On: 05/02/2015 07:33   Dg Chest Port 1 View  04/28/2015   CLINICAL DATA:  Fall, syncope, bradycardia  EXAM: PORTABLE CHEST - 1 VIEW  COMPARISON:  07/29/2014  FINDINGS: Pacing pads overlie the chest. Implanted monitor device projects over the left hemithorax. Moderate enlargement of the  cardiomediastinal silhouette is noted without evidence for edema. No displaced rib fracture allowing for technique. No focal pulmonary opacity. No supine evidence for pneumothorax. No pleural effusion. Presumed skin fold artifact simulating pneumothorax at the right lung base on repeat image 2.  IMPRESSION: Cardiomegaly without focal acute finding.   Electronically Signed   By: Conchita Paris M.D.   On: 04/28/2015 16:40   Dg Hip Operative Unilat With Pelvis Left  05/01/2015   CLINICAL DATA:  Left anterior hip arthroplasty.  EXAM: OPERATIVE LEFT HIP WITH PELVIS  COMPARISON:  04/28/2015  FINDINGS: Two intraoperative views. Left hip arthroplasty, without acute hardware complication or periprosthetic fracture.  IMPRESSION: Intraoperative imaging of left hip arthroplasty.   Electronically Signed   By: Abigail Miyamoto M.D.   On: 05/01/2015 19:43   Dg Femur Min 2 Views Left  04/28/2015   CLINICAL DATA:  Closed left hip fracture, initial encounter.  EXAM: LEFT FEMUR 2 VIEWS  COMPARISON:  None.  FINDINGS: Findings consistent with mildly displaced proximal left femoral neck fracture. This appears closed and posttraumatic. Remaining portion of femur appears normal.  IMPRESSION: Probable mildly displaced proximal left femoral neck fracture.   Electronically Signed   By: Marijo Conception, M.D.   On: 04/28/2015 18:12    Microbiology: Recent Results (from the past 240 hour(s))  MRSA PCR Screening     Status: None   Collection Time: 04/28/15  8:05 PM  Result Value Ref Range Status   MRSA by PCR NEGATIVE NEGATIVE Final    Comment:        The GeneXpert MRSA Assay (FDA approved for NASAL specimens only), is one component of a comprehensive MRSA colonization surveillance program. It is not intended to diagnose MRSA infection nor to guide or monitor treatment for MRSA infections.   Surgical pcr screen     Status: None   Collection Time: 04/29/15 10:17 PM  Result Value Ref Range Status   MRSA, PCR NEGATIVE  NEGATIVE Final   Staphylococcus aureus NEGATIVE NEGATIVE Final    Comment:        The Xpert SA Assay (FDA approved for NASAL specimens in patients over 59 years of age), is one component of a comprehensive surveillance program.  Test performance has been validated by [facility] for patients greater than or equal to 29 year old. It is not intended to diagnose infection nor to guide or monitor treatment.      Labs: Basic Metabolic Panel:  Recent Labs Lab 04/28/15 1542 04/28/15 2123 04/30/15 0305 05/01/15 0021 05/02/15 0230 05/03/15 0327  NA 143  --  137 136 133* 133*  K 4.6  --  4.1 4.1 4.9 4.9  CL 103  --  100* 97* 99* 96*  CO2 25  --  30 30 26 27   GLUCOSE 113*  --  101* 109* 198* 107*  BUN 60*  --  48* 45* 41* 50*  CREATININE 2.37*  --  2.25* 2.41* 2.00* 2.58*  CALCIUM 8.5*  --  8.2* 8.0* 8.0* 8.0*  MG  --  1.7  --   --   --   --   PHOS  --  4.1  --   --   --   --    Liver Function Tests:  Recent Labs Lab 04/28/15 1542  AST 31  ALT 32  ALKPHOS 69  BILITOT 0.8  PROT 6.2*  ALBUMIN 3.5   CBC:  Recent Labs Lab 04/28/15 1542 04/30/15 0305 05/01/15 0021 05/02/15 0230 05/03/15 0327  WBC 5.9 4.3 6.9 8.2 6.7  NEUTROABS 4.0  --   --   --   --   HGB 9.1* 7.1* 10.6* 10.5* 7.9*  HCT 27.9* 21.4* 31.2* 30.9* 23.2*  MCV 85.8 85.3 84.1 84.7 86.6  PLT 199 134* 143* 126* 130*   Cardiac Enzymes:  Recent Labs Lab 04/28/15 1542 04/30/15 0305  TROPONINI 0.04* 0.05*   CBG:  Recent Labs Lab 04/28/15 1659  GLUCAP 110*    Signed:  Barton Dubois  [facility] 05/03/2015, 2:53 PM

## 2015-05-03 NOTE — Progress Notes (Signed)
CSW (Clinical Education officer, museum) received call from pt daughter-in-law notifying that pt had informed her plan was for dc today. Pt daughter-in-law wanting CSW to confirm as soon as possible as both she and pt son are working today and have not made facility choice. CSW notified by [facility] that plan is for dc today. CSW called pt daughter-in-law and left voicemail to notify of plan for dc and requesting facility choice as soon as possible.  [place], [place]

## 2015-05-03 NOTE — Progress Notes (Addendum)
CSW Armed forces technical officer) spoke with pt and pt family. Pt daughter-in-law aware of plan for dc today and will visit facilities during lunch break and notify CSW of decision.    ADDENDUM 2:00pm: Pt family has chosen bed at [facility]. CSW paged MD to notify and request dc summary.   [place], [place]

## 2015-05-11 ENCOUNTER — Ambulatory Visit (INDEPENDENT_AMBULATORY_CARE_PROVIDER_SITE_OTHER): Payer: Medicare Other | Admitting: *Deleted

## 2015-05-11 DIAGNOSIS — R001 Bradycardia, unspecified: Secondary | ICD-10-CM | POA: Diagnosis not present

## 2015-05-11 DIAGNOSIS — I480 Paroxysmal atrial fibrillation: Secondary | ICD-10-CM

## 2015-05-11 LAB — CUP PACEART INCLINIC DEVICE CHECK
Battery Voltage: 3.13 V
Brady Statistic AP VP Percent: 1.61 %
Brady Statistic AP VS Percent: 96.26 %
Brady Statistic RA Percent Paced: 97.87 %
Brady Statistic RV Percent Paced: 1.61 %
Date Time Interrogation Session: 20160630173905
Lead Channel Impedance Value: 342 Ohm
Lead Channel Impedance Value: 380 Ohm
Lead Channel Impedance Value: 399 Ohm
Lead Channel Impedance Value: 494 Ohm
Lead Channel Pacing Threshold Amplitude: 0.75 V
Lead Channel Pacing Threshold Pulse Width: 0.4 ms
Lead Channel Sensing Intrinsic Amplitude: 3.25 mV
Lead Channel Setting Pacing Amplitude: 3.5 V
Lead Channel Setting Pacing Pulse Width: 0.4 ms
MDC IDC MSMT LEADCHNL RA PACING THRESHOLD AMPLITUDE: 0.75 V
MDC IDC MSMT LEADCHNL RA PACING THRESHOLD PULSEWIDTH: 0.4 ms
MDC IDC MSMT LEADCHNL RV SENSING INTR AMPL: 23.875 mV
MDC IDC SET LEADCHNL RV PACING AMPLITUDE: 3.5 V
MDC IDC SET LEADCHNL RV SENSING SENSITIVITY: 4 mV
MDC IDC STAT BRADY AS VP PERCENT: 0 %
MDC IDC STAT BRADY AS VS PERCENT: 2.13 %
Zone Setting Detection Interval: 400 ms
Zone Setting Detection Interval: 400 ms

## 2015-05-23 ENCOUNTER — Telehealth: Payer: Self-pay | Admitting: Cardiology

## 2015-05-23 ENCOUNTER — Telehealth: Payer: Self-pay | Admitting: Family Medicine

## 2015-05-23 NOTE — Telephone Encounter (Signed)
Patient's insurance will no longer pay for [place], so the patient was told that she needs in home therapy. She was told that she needs to call PCP first in order to do it. Pt's POA, Butch Penny, not sure if an appointment needs to be made or if we can just call them and get everything started. Please advise.

## 2015-05-23 NOTE — Telephone Encounter (Signed)
Pt's daughter in law Butch Penny is calling in for the pt because she is concerned about the amount of meds she is now taking since being released from the hospital. She would like to get [facility] [address]. Please call  Thanks

## 2015-05-23 NOTE — Telephone Encounter (Signed)
LMTCB

## 2015-05-24 MED ORDER — AMLODIPINE BESYLATE 5 MG PO TABS: 5.0000 mg | ORAL_TABLET | Freq: Every day | ORAL | Status: DC

## 2015-05-24 NOTE — Telephone Encounter (Signed)
I left a voice message with below information. 

## 2015-05-24 NOTE — Telephone Encounter (Signed)
We have not been involved at all with her inpatient rehab so whoever has been in control of this will need to take care of any outpatient rehab

## 2015-05-24 NOTE — Telephone Encounter (Signed)
[name] is returning Nathan's call from yesterday. She says that she was in a meeting   Thanks

## 2015-05-24 NOTE — Telephone Encounter (Signed)
Returned call to Butch Penny patient's care giver.She stated patient needed a refill for amlodipine.Refill sent to pharmacy.

## 2015-05-24 NOTE — Telephone Encounter (Signed)
[name] will waiting for intemin physical therapy to contact her or [place].

## 2015-05-29 ENCOUNTER — Telehealth: Payer: Self-pay | Admitting: Family Medicine

## 2015-05-29 NOTE — Telephone Encounter (Signed)
Pt daughter call to say that pt is not sleeping that well. Pt is taking her anxiety and  temazepam (RESTORIL) 30 MG capsule at night and say she is only sleeping a few hours. They are asking with the two taken together will this cause her not to sleep thru the night

## 2015-05-29 NOTE — Telephone Encounter (Signed)
She should separate these meds. Take the Lorazepam just after dinner and ten take the Temazepam about 30 minutes prior to going to bed

## 2015-05-30 NOTE — Telephone Encounter (Signed)
I spoke with pt and went over below information. 

## 2015-06-01 ENCOUNTER — Ambulatory Visit (INDEPENDENT_AMBULATORY_CARE_PROVIDER_SITE_OTHER): Payer: Medicare Other | Admitting: Gastroenterology

## 2015-06-01 ENCOUNTER — Encounter: Payer: Self-pay | Admitting: Gastroenterology

## 2015-06-01 VITALS — BP 126/52 | HR 68 | Ht 62.6 in | Wt 134.5 lb

## 2015-06-01 DIAGNOSIS — K589 Irritable bowel syndrome without diarrhea: Secondary | ICD-10-CM

## 2015-06-01 DIAGNOSIS — K219 Gastro-esophageal reflux disease without esophagitis: Secondary | ICD-10-CM | POA: Diagnosis not present

## 2015-06-01 MED ORDER — HYOSCYAMINE SULFATE 0.125 MG PO TABS
ORAL_TABLET | ORAL | Status: DC
Start: 2015-06-01 — End: 2015-12-12

## 2015-06-01 NOTE — Progress Notes (Signed)
    History of Present Illness: This is an 81 year old female with a history of GERD, esophageal stricture, hiatal hernia, Cameron erosions, personal history of a tubulovillous adenomatous colon polyp and IBS. Reviewed records from complicated hospitalization for syncope, pacemaker, L hip replacement following fracture. At home now. Had GERD flare and constipation in hospital. GERD now well controlled. Bowels back to baseline of alternation constipation and diarrhea.  Current Medications, Allergies, Past Medical History, Past Surgical History, Family History and Social History were reviewed in [employer] record.  Physical Exam: General: Well developed , well nourished, elderly, using walker, no acute distress Head: Normocephalic and atraumatic Eyes:  sclerae anicteric, EOMI Ears: Normal auditory acuity Mouth: No deformity or lesions Lungs: Clear throughout to auscultation Heart: Regular rate and rhythm; no murmurs, rubs or bruits Abdomen: Soft, non tender and non distended. No masses, hepatosplenomegaly or hernias noted. Normal Bowel sounds Musculoskeletal: Symmetrical with no gross deformities  Pulses:  Normal pulses noted Extremities: No clubbing, cyanosis, edema or deformities noted Neurological: Alert oriented x 4, grossly nonfocal Psychological:  Alert and cooperative. Normal mood and affect  Assessment and Recommendations:  1. IBS. Alternating pattern. Miralax as needed. Continue Levsin 1-2 every 4 hours as needed. REV in 1 year.   2. GERD with history of esophageal stricture, hiatal hernia and Cameron erosions. Continue omeprazole 40 mg every morning long-term.

## 2015-06-01 NOTE — Patient Instructions (Signed)
We have sent the following medications to your pharmacy for you to pick up at your convenience:Levsin.  Thank you for choosing me and [facility].  Pricilla Riffle. Dagoberto Ligas., MD., [name]

## 2015-06-07 ENCOUNTER — Telehealth: Payer: Self-pay | Admitting: Family Medicine

## 2015-06-07 NOTE — Telephone Encounter (Signed)
Pt had hip replacement surgery 4 wks ago and has history of gout and would like [facility] [place]. Pt is on walker and cane

## 2015-06-07 NOTE — Telephone Encounter (Signed)
Call in Prednisone 10 mg tabs to take 4 a day for 3 days, then 3 a day for 3 days, then 2 a day for 3 days, then 1 a day for 3 days, then stop, #60

## 2015-06-08 MED ORDER — PREDNISONE 10 MG PO TABS: ORAL_TABLET | ORAL | Status: DC

## 2015-06-08 NOTE — Telephone Encounter (Signed)
I called in script and spoke with pt. 

## 2015-06-09 ENCOUNTER — Other Ambulatory Visit: Payer: Self-pay | Admitting: Cardiology

## 2015-06-09 DIAGNOSIS — Z96641 Presence of right artificial hip joint: Secondary | ICD-10-CM | POA: Diagnosis not present

## 2015-06-09 DIAGNOSIS — I110 Hypertensive heart disease with heart failure: Secondary | ICD-10-CM

## 2015-06-09 DIAGNOSIS — I5032 Chronic diastolic (congestive) heart failure: Secondary | ICD-10-CM | POA: Diagnosis not present

## 2015-06-09 DIAGNOSIS — S72012D Unspecified intracapsular fracture of left femur, subsequent encounter for closed fracture with routine healing: Secondary | ICD-10-CM | POA: Diagnosis not present

## 2015-06-09 DIAGNOSIS — R2689 Other abnormalities of gait and mobility: Secondary | ICD-10-CM | POA: Diagnosis not present

## 2015-06-12 ENCOUNTER — Other Ambulatory Visit: Payer: Self-pay | Admitting: Family Medicine

## 2015-06-13 NOTE — Telephone Encounter (Signed)
Refill for 6 months. 

## 2015-06-14 ENCOUNTER — Telehealth: Payer: Self-pay | Admitting: Internal Medicine

## 2015-06-14 NOTE — Telephone Encounter (Signed)
New message     Pt had pacemaker implanted on June 20 or 21st.  Pt would like to know if she can start driving again. Please call to discuss

## 2015-06-16 NOTE — Telephone Encounter (Signed)
Follow up      Pt had a pacemaker put in in June.  Can she start driving again?

## 2015-06-16 NOTE — Telephone Encounter (Signed)
Informed patient ok to drive

## 2015-06-19 ENCOUNTER — Encounter: Payer: Self-pay | Admitting: Internal Medicine

## 2015-06-19 ENCOUNTER — Ambulatory Visit: Payer: Medicare Other | Admitting: Family Medicine

## 2015-06-20 ENCOUNTER — Ambulatory Visit (INDEPENDENT_AMBULATORY_CARE_PROVIDER_SITE_OTHER): Payer: Medicare Other | Admitting: Adult Health

## 2015-06-20 ENCOUNTER — Encounter: Payer: Self-pay | Admitting: Adult Health

## 2015-06-20 VITALS — BP 140/70 | HR 80 | Temp 97.8°F | Ht 62.6 in | Wt 135.4 lb

## 2015-06-20 DIAGNOSIS — R600 Localized edema: Secondary | ICD-10-CM

## 2015-06-20 NOTE — Patient Instructions (Signed)
Take an extra dose of Lasix when you get home.   Please take your fluid pills M/W/F   Follow up if no improvement after 2-3 days of Lasix.   Go to the ER with any shortness of breath or chest pain.   Elevate your legs at night and watch your salt intake.

## 2015-06-20 NOTE — Progress Notes (Signed)
Pre visit review using our clinic review tool, if applicable. No additional management support is needed unless otherwise documented below in the visit note. 

## 2015-06-20 NOTE — Progress Notes (Signed)
Subjective:    Patient ID: [name], female    DOB: [birth year], 81 y.o.   MRN: [id number]  HPI  81 year old female who presents to the office for bilateral leg swelling. She does have chronic left lower extremity swelling but endorses that this is more swollen than usual. Her right leg "never" gets swollen, but that over the last " 2 weeks off and on" her right leg is becoming swollen. She endorses that it is better than it has been today.   She takes 40 mg Lasix as needed. The last time she took it was yesterday. Her last Echo was in 2014, with an EF 55-60%. She sees her cardiologist in September.   Denies any chest pain or SOB  She endorses that she finds it difficult to eat a low sodium diet.    Review of Systems  Constitutional: Negative.   Respiratory: Negative for cough, chest tightness and shortness of breath.   Cardiovascular: Positive for leg swelling. Negative for chest pain and palpitations.  Skin: Negative.   Neurological: Negative.   All other systems reviewed and are negative.  Past Medical History  Diagnosis Date  . Hyperlipidemia   . Hypothyroidism   . Arthritis   . Anemia   . Asthma   . Anxiety   . IBS (irritable bowel syndrome)   . Gout   . Colon polyps   . Tubulovillous adenoma 4/07  . Hemorrhoids   . Diverticulosis   . Chronic diarrhea   . Chronic kidney disease, stage IV (severe)   . Hypertensive heart disease     no significant RAS by MRA 2002- (<30% LRAS)   . Paroxysmal atrial fibrillation 11/01/2013    Intermittent through the years and recurrent in December of 2014 treated with amiodarone;; Negative Myoview 10/2013  . Peripheral vascular disease     123456 LICA, [id number] RICA by doppler 2009   . Benign positional vertigo   . Diastolic dysfunction, left ventricle 11/03/2013  . Syncope and collapse 04/28/2015  . Sinus arrest 04/28/2015  . LBBB (left bundle branch block)- new 11/01/13 11/01/2013  . LBBB (left bundle branch block)      History   Social History  . Marital Status: Widowed    Spouse Name: N/A  . Number of Children: 2  . Years of Education: N/A   Occupational History  . retired    Social History Main Topics  . Smoking status: Never Smoker   . Smokeless tobacco: Never Used     Comment: never used tobacco  . Alcohol Use: No  . Drug Use: No  . Sexual Activity: No   Other Topics Concern  . Not on file   Social History Narrative   She is a widowed mother of 2, one child is deceased. Grandmother of one. She appears to be working out routinely at [employer] using [employer]. She is otherwise quite active.   She does not drink alcohol, does not smoke.  She is retired from [employer].    Lives alone in a one story condo.    Past Surgical History  Procedure Laterality Date  . Lumbar disc surgery  2000  . Squamous cell skin cancer removed    . Oophorectomy  1962    right  . Appendectomy    . Low anterior bowel resection  7/08  . Cataract extraction    . Transthoracic echocardiogram  11/03/2013    Echocardiogram: EF 55-60%, mild LVH, elevated  bili pressures. Mild aortic valve calcification.  Marland Kitchen Nm myoview ltd  11/02/2013    Negative for ischemia or infarction. EF 60%.  Marland Kitchen Ep implantable device N/A 05/01/2015    Procedure: Pacemaker Implant;  Surgeon: Deboraha Sprang, MD;  Location: [facility];  Service: Cardiovascular;  Laterality: N/A;  . Hip arthroplasty Left 05/01/2015    Procedure: ARTHROPLASTY BIPOLAR HIP (HEMIARTHROPLASTY);  Surgeon: Rod Can, MD;  Location: [facility];  Service: Orthopedics;  Laterality: Left;    Family History  Problem Relation Age of Onset  . Cancer Mother 21    unknown  . Heart attack Father   . Heart disease Brother   . Stroke Maternal Grandmother   . Lung cancer Maternal Grandfather   . Stroke Paternal Grandmother   . Colon cancer Daughter     Allergies  Allergen Reactions  . Ambien [Zolpidem Tartrate]     " made her crazy  "  . Amoxicillin     REACTION: unspecified  . Ceftin [Cefuroxime Axetil] Diarrhea  . Codeine Phosphate     REACTION: unspecified  . Colchicine     Severe diarrhea  . Lipitor [Atorvastatin] Other (See Comments)    "makes legs jump all night"  . Penicillins     REACTION: nausea, swelling  . Xifaxan [Rifaximin] Other (See Comments)    unknown    Current Outpatient Prescriptions on File Prior to Visit  Medication Sig Dispense Refill  . amLODipine (NORVASC) 5 MG tablet Take 1 tablet (5 mg total) by mouth daily. 30 tablet 6  . apixaban (ELIQUIS) 2.5 MG TABS tablet Take 1 tablet (2.5 mg total) by mouth 2 (two) times daily.    . CVS VITAMIN B12 1000 MCG tablet TAKE 1 TABLET BY MOUTH EVERY DAY 30 tablet 6  . ELIQUIS 2.5 MG TABS tablet TAKE 1 TABLET BY MOUTH TWICE A DAY 60 tablet 5  . furosemide (LASIX) 40 MG tablet Take 1 tablet (40 mg total) by mouth daily as needed for fluid (more than 2-3 pounds overnight and/or 5 pounds in a week).    . hyoscyamine (LEVSIN, ANASPAZ) 0.125 MG tablet Take 1-2 tablets by mouth or under tongue every 4 hours as needed before meals 360 tablet 11  . isosorbide mononitrate (IMDUR) 60 MG 24 hr tablet TAKE 1 TABLET (60 MG TOTAL) BY MOUTH DAILY. 30 tablet 10  . levothyroxine (SYNTHROID, LEVOTHROID) 88 MCG tablet TAKE 1 TABLET (88 MCG TOTAL) BY MOUTH DAILY BEFORE BREAKFAST. 90 tablet 1  . LORazepam (ATIVAN) 0.5 MG tablet Take 1 tablet (0.5 mg total) by mouth every 8 (eight) hours as needed for anxiety. 30 tablet 0  . nitroGLYCERIN (NITROSTAT) 0.4 MG SL tablet Place 1 tablet (0.4 mg total) under the tongue every 5 (five) minutes x 3 doses as needed for chest pain. 25 tablet 3  . omeprazole (PRILOSEC) 40 MG capsule Take 1 capsule (40 mg total) by mouth daily. 30 capsule 11  . temazepam (RESTORIL) 30 MG capsule TAKE ONE CAPSULE BY MOUTH AT BEDTIME AS NEEDED FOR SLEEP 30 capsule 5   No current facility-administered medications on file prior to visit.    BP 140/70 mmHg   Pulse 80  Temp(Src) 97.8 F (36.6 C) (Oral)  Ht 5' 2.6" (1.59 m)  Wt 135 lb 6.4 oz (61.417 kg)  BMI 24.29 kg/m2  SpO2 99%       Objective:   Physical Exam  Constitutional: She is oriented to person, place, and time. She appears well-developed and  well-nourished. No distress.  Cardiovascular: Normal rate, normal heart sounds and intact distal pulses.  Exam reveals no gallop and no friction rub.   No murmur heard. Pulmonary/Chest: Effort normal and breath sounds normal. No respiratory distress. She has no wheezes. She has no rales. She exhibits no tenderness.  Musculoskeletal: Normal range of motion. She exhibits edema. She exhibits no tenderness.  +1 pitting edema to right leg.  +1 pitting edema to left leg  No pain, warmth, swelling to calf.   No cellulitis.   Neurological: She is alert and oriented to person, place, and time.  Skin: Skin is warm and dry. She is not diaphoretic.  Psychiatric: She has a normal mood and affect. Her behavior is normal. Judgment and thought content normal.  Nursing note and vitals reviewed.      Assessment & Plan:  1. Edema of both legs -Take a dose of 40mg  Lasix when you get home - Lasix M/W/F - Follow up with Cardiology as scheduled.  - Return for increased swelling - Go to the ER with CP or SOB

## 2015-06-29 ENCOUNTER — Other Ambulatory Visit: Payer: Self-pay

## 2015-06-29 MED ORDER — AMLODIPINE BESYLATE 5 MG PO TABS: 5.0000 mg | ORAL_TABLET | Freq: Every day | ORAL | Status: DC

## 2015-06-29 MED ORDER — HYDRALAZINE HCL 100 MG PO TABS: 100.0000 mg | ORAL_TABLET | Freq: Three times a day (TID) | ORAL | Status: DC

## 2015-06-29 NOTE — Telephone Encounter (Signed)
Rx(s) sent to pharmacy electronically.  

## 2015-07-06 ENCOUNTER — Telehealth: Payer: Self-pay | Admitting: Internal Medicine

## 2015-07-06 LAB — HM MAMMOGRAPHY

## 2015-07-06 NOTE — Telephone Encounter (Signed)
New Message   Pt has an appt for a mammogram appt today at 11 am and she needs to know if she can   continue with her having a pacemaker

## 2015-07-10 ENCOUNTER — Encounter: Payer: Self-pay | Admitting: Family Medicine

## 2015-07-11 NOTE — Telephone Encounter (Signed)
Follow up      Pt was returning Sherri's call regarding having a mammogram.  She want you to know she already had mammogram and no need to call her back.

## 2015-07-17 ENCOUNTER — Other Ambulatory Visit: Payer: Self-pay | Admitting: Family Medicine

## 2015-07-18 NOTE — Telephone Encounter (Signed)
Call in #[phone] with 5 rf 

## 2015-07-19 ENCOUNTER — Other Ambulatory Visit: Payer: Self-pay | Admitting: Family Medicine

## 2015-07-26 ENCOUNTER — Ambulatory Visit (INDEPENDENT_AMBULATORY_CARE_PROVIDER_SITE_OTHER): Payer: Medicare Other | Admitting: Family Medicine

## 2015-07-26 ENCOUNTER — Encounter: Payer: Self-pay | Admitting: Family Medicine

## 2015-07-26 VITALS — BP 167/72 | HR 90 | Temp 97.6°F | Ht 62.5 in | Wt 137.0 lb

## 2015-07-26 DIAGNOSIS — M1000 Idiopathic gout, unspecified site: Secondary | ICD-10-CM

## 2015-07-26 DIAGNOSIS — G4700 Insomnia, unspecified: Secondary | ICD-10-CM

## 2015-07-26 MED ORDER — TRAZODONE HCL 50 MG PO TABS: 50.0000 mg | ORAL_TABLET | Freq: Every day | ORAL | Status: DC

## 2015-07-26 MED ORDER — INDOMETHACIN 50 MG PO CAPS: 50.0000 mg | ORAL_CAPSULE | Freq: Three times a day (TID) | ORAL | Status: DC | PRN

## 2015-07-26 NOTE — Progress Notes (Signed)
   Subjective:    Patient ID: SAVINA BARRIOS, female    DOB: 01-Aug-1924, 81 y.o.   MRN: [id number]  HPI Here for 2 problems. First she has trouble sleeping at night but has had some success taking Temazepam. However her insurance company is refusing to pay for this. Also the gout in her left foot has returned and she has a fair amount of pain. She has good results with taking prednisone for this, but this makes her irritable and keeps her awake at night.    Review of Systems  Constitutional: Negative.   HENT: Negative.   Eyes: Negative.   Respiratory: Negative.   Cardiovascular: Negative.   Musculoskeletal: Positive for joint swelling and arthralgias.  Psychiatric/Behavioral: Positive for sleep disturbance.       Objective:   Physical Exam  Constitutional: She is oriented to person, place, and time. She appears well-developed and well-nourished.  Cardiovascular: Normal rate, regular rhythm, normal heart sounds and intact distal pulses.   Pulmonary/Chest: Effort normal and breath sounds normal.  Musculoskeletal:  Left forefoot is red, swollen, and tender   Neurological: She is alert and oriented to person, place, and time.          Assessment & Plan:  Treat the gout with Indocin 50 mg prn. Switch to Trazodone for sleep.

## 2015-07-26 NOTE — Progress Notes (Signed)
Pre visit review using our clinic review tool, if applicable. No additional management support is needed unless otherwise documented below in the visit note. 

## 2015-08-01 ENCOUNTER — Encounter: Payer: Self-pay | Admitting: Internal Medicine

## 2015-08-01 ENCOUNTER — Ambulatory Visit (INDEPENDENT_AMBULATORY_CARE_PROVIDER_SITE_OTHER): Payer: Medicare Other | Admitting: Internal Medicine

## 2015-08-01 VITALS — BP 142/66 | HR 72 | Ht 64.0 in | Wt 137.2 lb

## 2015-08-01 DIAGNOSIS — I480 Paroxysmal atrial fibrillation: Secondary | ICD-10-CM | POA: Diagnosis not present

## 2015-08-01 DIAGNOSIS — Z45018 Encounter for adjustment and management of other part of cardiac pacemaker: Secondary | ICD-10-CM | POA: Diagnosis not present

## 2015-08-01 DIAGNOSIS — I441 Atrioventricular block, second degree: Secondary | ICD-10-CM | POA: Diagnosis not present

## 2015-08-01 DIAGNOSIS — R001 Bradycardia, unspecified: Secondary | ICD-10-CM

## 2015-08-01 DIAGNOSIS — Z959 Presence of cardiac and vascular implant and graft, unspecified: Secondary | ICD-10-CM

## 2015-08-01 LAB — CUP PACEART INCLINIC DEVICE CHECK
Battery Remaining Longevity: 121 mo
Brady Statistic AP VP Percent: 0.23 %
Brady Statistic AP VS Percent: 97.41 %
Brady Statistic AS VP Percent: 0 %
Brady Statistic AS VS Percent: 2.36 %
Date Time Interrogation Session: 20160920170537
Lead Channel Impedance Value: 323 Ohm
Lead Channel Impedance Value: 361 Ohm
Lead Channel Impedance Value: 418 Ohm
Lead Channel Pacing Threshold Amplitude: 0.75 V
Lead Channel Pacing Threshold Pulse Width: 0.4 ms
Lead Channel Sensing Intrinsic Amplitude: 17.625 mV
Lead Channel Sensing Intrinsic Amplitude: 2.125 mV
Lead Channel Sensing Intrinsic Amplitude: 20.625 mV
Lead Channel Setting Pacing Pulse Width: 0.4 ms
Lead Channel Setting Sensing Sensitivity: 4 mV
MDC IDC MSMT BATTERY VOLTAGE: 3.05 V
MDC IDC MSMT LEADCHNL RA PACING THRESHOLD AMPLITUDE: 0.625 V
MDC IDC MSMT LEADCHNL RA PACING THRESHOLD PULSEWIDTH: 0.4 ms
MDC IDC MSMT LEADCHNL RA SENSING INTR AMPL: 2.875 mV
MDC IDC MSMT LEADCHNL RV IMPEDANCE VALUE: 513 Ohm
MDC IDC SET LEADCHNL RA PACING AMPLITUDE: 1.5 V
MDC IDC SET LEADCHNL RV PACING AMPLITUDE: 2 V
MDC IDC SET ZONE DETECTION INTERVAL: 400 ms
MDC IDC SET ZONE DETECTION INTERVAL: 400 ms
MDC IDC STAT BRADY RA PERCENT PACED: 97.64 %
MDC IDC STAT BRADY RV PERCENT PACED: 0.23 %

## 2015-08-01 NOTE — Patient Instructions (Signed)
Medication Instructions:  Your physician recommends that you continue on your current medications as directed. Please refer to the Current Medication list given to you today.  Labwork: None ordered  Testing/Procedures: None ordered  Follow-Up: Remote monitoring is used to monitor your Pacemaker of ICD from home. This monitoring reduces the number of office visits required to check your device to one time per year. It allows [place] to keep an eye on the functioning of your device to ensure it is working properly. You are scheduled for a device check from home on 10/31/15. You may send your transmission at any time that day. If you have a wireless device, the transmission will be sent automatically. After your physician reviews your transmission, you will receive a postcard with your next transmission date.  Your physician wants you to follow-up in: 9 months with Dr. Caryl Comes. You will receive a reminder letter in the mail two months in advance. If you don't receive a letter, please call our office to schedule the follow-up appointment.   Any Other Special Instructions Will Be Listed Below (If Applicable). Thank you for choosing [facility]!!

## 2015-08-01 NOTE — Progress Notes (Signed)
Patient Care Team: [name], MD as PCP - General   HPI  [name] is a 81 y.o. female Seen in follow-up for pacemaker implanted 6/16 for syncope in the context of left bundle branch block sinus arrest and PAF.  Exercise tolerance is much impraoved  Scant edema in the setting of gout  Records and Results Reviewed   Past Medical History  Diagnosis Date  . Hyperlipidemia   . Hypothyroidism   . Arthritis   . Anemia   . Asthma   . Anxiety   . IBS (irritable bowel syndrome)   . Gout   . Colon polyps   . Tubulovillous adenoma 4/07  . Hemorrhoids   . Diverticulosis   . Chronic diarrhea   . Chronic kidney disease, stage IV (severe)   . Hypertensive heart disease     no significant RAS by MRA 2002- (<30% LRAS)   . Paroxysmal atrial fibrillation 11/01/2013    Intermittent through the years and recurrent in December of 2014 treated with amiodarone;; Negative Myoview 10/2013  . Peripheral vascular disease     123456 LICA, XX123456 RICA by doppler 2009   . Benign positional vertigo   . Diastolic dysfunction, left ventricle 11/03/2013  . Syncope and collapse 04/28/2015  . Sinus arrest 04/28/2015  . LBBB (left bundle branch block)- new 11/01/13 11/01/2013  . LBBB (left bundle branch block)     Past Surgical History  Procedure Laterality Date  . Lumbar disc surgery  2000  . Squamous cell skin cancer removed    . Oophorectomy  1962    right  . Appendectomy    . Low anterior bowel resection  7/08  . Cataract extraction    . Transthoracic echocardiogram  11/03/2013    Echocardiogram: EF 55-60%, mild LVH, elevated bili pressures. Mild aortic valve calcification.  Marland Kitchen Nm myoview ltd  11/02/2013    Negative for ischemia or infarction. EF 60%.  Marland Kitchen Ep implantable device N/A 05/01/2015    Procedure: Pacemaker Implant;  Surgeon: Deboraha Sprang, MD;  Location: [facility];  Service: Cardiovascular;  Laterality: N/A;  . Hip arthroplasty Left 05/01/2015    Procedure:  ARTHROPLASTY BIPOLAR HIP (HEMIARTHROPLASTY);  Surgeon: Rod Can, MD;  Location: [facility];  Service: Orthopedics;  Laterality: Left;    Current Outpatient Prescriptions  Medication Sig Dispense Refill  . amLODipine (NORVASC) 5 MG tablet Take 1 tablet (5 mg total) by mouth daily. 90 tablet 3  . apixaban (ELIQUIS) 2.5 MG TABS tablet Take 1 tablet (2.5 mg total) by mouth 2 (two) times daily.    . CVS VITAMIN B12 1000 MCG tablet TAKE 1 TABLET BY MOUTH EVERY DAY 30 tablet 6  . ELIQUIS 2.5 MG TABS tablet TAKE 1 TABLET BY MOUTH TWICE A DAY (Patient not taking: Reported on 07/26/2015) 60 tablet 5  . furosemide (LASIX) 40 MG tablet Take 1 tablet (40 mg total) by mouth daily as needed for fluid (more than 2-3 pounds overnight and/or 5 pounds in a week).    . hydrALAZINE (APRESOLINE) 100 MG tablet Take 1 tablet (100 mg total) by mouth 3 (three) times daily. (Patient taking differently: Take 100 mg by mouth 2 (two) times daily. ) 270 tablet 3  . hyoscyamine (LEVSIN, ANASPAZ) 0.125 MG tablet Take 1-2 tablets by mouth or under tongue every 4 hours as needed before meals 360 tablet 11  . indomethacin (INDOCIN) 50 MG capsule Take 1 capsule (50 mg total) by mouth  3 (three) times daily as needed for moderate pain. 60 capsule 5  . isosorbide mononitrate (IMDUR) 60 MG 24 hr tablet TAKE 1 TABLET (60 MG TOTAL) BY MOUTH DAILY. 30 tablet 10  . levothyroxine (SYNTHROID, LEVOTHROID) 88 MCG tablet TAKE 1 TABLET (88 MCG TOTAL) BY MOUTH DAILY BEFORE BREAKFAST. 90 tablet 1  . LORazepam (ATIVAN) 0.5 MG tablet TAKE 1 TABLET BY MOUTH EVERY 8 HOURS AS NEEDED 90 tablet 5  . nitroGLYCERIN (NITROSTAT) 0.4 MG SL tablet Place 1 tablet (0.4 mg total) under the tongue every 5 (five) minutes x 3 doses as needed for chest pain. (Patient not taking: Reported on 07/26/2015) 25 tablet 3  . omeprazole (PRILOSEC) 40 MG capsule Take 1 capsule (40 mg total) by mouth daily. 30 capsule 11  . traZODone (DESYREL) 50 MG tablet Take 1 tablet (50 mg  total) by mouth at bedtime. 30 tablet 5   No current facility-administered medications for this visit.    Allergies  Allergen Reactions  . Ambien [Zolpidem Tartrate]     " made her crazy "  . Amoxicillin     REACTION: unspecified  . Ceftin [Cefuroxime Axetil] Diarrhea  . Codeine Phosphate     REACTION: unspecified  . Colchicine     Severe diarrhea  . Lipitor [Atorvastatin] Other (See Comments)    "makes legs jump all night"  . Penicillins     REACTION: nausea, swelling  . Xifaxan [Rifaximin] Other (See Comments)    unknown      Review of Systems negative except from HPI and PMH  Physical Exam There were no vitals taken for this visit. Well developed and well nourished in no acute distress HENT normal E scleral and icterus clear Neck Supple JVP flat; carotids brisk and full Clear to ausculation Device pocket well healed; without hematoma or erythema.  There is no tethering Regular rate and rhythm, no murmurs gallops or rub Soft with active bowel sounds No clubbing cyanosis Trace Edema Alert and oriented, grossly normal motor and sensory function Skin Warm and Dry  ECG  Apcaing  Assessment and  Plan  Sinus node dysfunciton  Pacemaker- [employer]onic The patient's device was interrogated and the information was fully reviewed.  The device was reprogrammed to  maixmize longevity  Edema   Continue current meds;  Edema may be related to gout or amlodipine

## 2015-08-03 ENCOUNTER — Other Ambulatory Visit: Payer: Self-pay | Admitting: Family Medicine

## 2015-08-16 ENCOUNTER — Ambulatory Visit (INDEPENDENT_AMBULATORY_CARE_PROVIDER_SITE_OTHER): Payer: Medicare Other | Admitting: Family Medicine

## 2015-08-16 ENCOUNTER — Encounter: Payer: Self-pay | Admitting: Family Medicine

## 2015-08-16 VITALS — BP 155/69 | HR 89 | Temp 97.6°F | Ht 64.0 in | Wt 135.0 lb

## 2015-08-16 DIAGNOSIS — M1000 Idiopathic gout, unspecified site: Secondary | ICD-10-CM | POA: Diagnosis not present

## 2015-08-16 LAB — URIC ACID: Uric Acid, Serum: 8.9 mg/dL — ABNORMAL HIGH (ref 2.4–7.0)

## 2015-08-16 NOTE — Progress Notes (Signed)
Pre visit review using our clinic review tool, if applicable. No additional management support is needed unless otherwise documented below in the visit note. 

## 2015-08-16 NOTE — Progress Notes (Signed)
   Subjective:    Patient ID: [name], female    DOB: 02-07-1924, 81 y.o.   MRN: [id number]  HPI Here for continued problems with gout. Her current episode started about one month ago. She has had pain and swelling in the left foot. She knows that prednisone gives her good relief but she has been hesitant to take this because it gives her side effects, primarily diarrhea. She saw me on 07-26-15 and I prescribed Indocin for her to try. However she never got this filled. Instead she has taken a few prednisone tablets here and there with mixed results. She went to an Urgent Care office this past weekend and they told er to take the full prednisone taper as prescribed but to add Imodium to it as needed. She has tried this and in fact it works quite well to control te diarrhea. She then restarted the taper, and today she took 4 tablets of prednisone. Her foot is already feeling a little better. I see she has not had a uric acid level checked for years.    Review of Systems  Constitutional: Negative.   Musculoskeletal: Positive for joint swelling and arthralgias.       Objective:   Physical Exam  Constitutional: She appears well-developed and well-nourished.  limping  Musculoskeletal:  Left forefoot is red, swollen., warm, and tender           Assessment & Plan:  Partially treated gout episode. I encouraged her to finish out the prednisone taper as prescribed to get this under control. Add Imodium prn. We will check a uric acid level today and will plan on starting allopurinol as soon as we can.

## 2015-08-25 MED ORDER — ALLOPURINOL 300 MG PO TABS: 300.0000 mg | ORAL_TABLET | Freq: Every day | ORAL | Status: DC

## 2015-08-25 NOTE — Addendum Note (Signed)
Addended by: Aggie Hacker A on: 08/25/2015 11:40 AM   Modules accepted: Orders

## 2015-09-01 ENCOUNTER — Ambulatory Visit (INDEPENDENT_AMBULATORY_CARE_PROVIDER_SITE_OTHER): Payer: Medicare Other | Admitting: Cardiology

## 2015-09-01 ENCOUNTER — Encounter: Payer: Self-pay | Admitting: Cardiology

## 2015-09-01 VITALS — BP 170/60 | HR 95 | Ht 64.0 in | Wt 129.6 lb

## 2015-09-01 DIAGNOSIS — E038 Other specified hypothyroidism: Secondary | ICD-10-CM

## 2015-09-01 DIAGNOSIS — Z8679 Personal history of other diseases of the circulatory system: Secondary | ICD-10-CM

## 2015-09-01 DIAGNOSIS — Z950 Presence of cardiac pacemaker: Secondary | ICD-10-CM

## 2015-09-01 DIAGNOSIS — M1000 Idiopathic gout, unspecified site: Secondary | ICD-10-CM

## 2015-09-01 DIAGNOSIS — I480 Paroxysmal atrial fibrillation: Secondary | ICD-10-CM

## 2015-09-01 DIAGNOSIS — N184 Chronic kidney disease, stage 4 (severe): Secondary | ICD-10-CM | POA: Diagnosis not present

## 2015-09-01 DIAGNOSIS — Z7901 Long term (current) use of anticoagulants: Secondary | ICD-10-CM

## 2015-09-01 LAB — TSH: TSH: 0.595 u[IU]/mL (ref 0.350–4.500)

## 2015-09-01 MED ORDER — CARVEDILOL 6.25 MG PO TABS: 6.2500 mg | ORAL_TABLET | Freq: Two times a day (BID) | ORAL | Status: DC

## 2015-09-01 NOTE — Progress Notes (Signed)
09/01/2015 [name]   [birth year]  [id number]  Primary Physician [name], MD Primary Cardiologist: Dr Wilhemina Cash  HPI:  81 y/o female with a history of PAF. She had been on Amiodarone but had syncope and heart block in June 2016. Her syncope caused a fractured Lt hip. She ultimately underwent MDT pacemaker implant followed by Lt hip surgery 05/01/15. [name] has followed her up post op.           She is in the office today as a f/u from Dr Allison Quarry L[facility] this past summer. The pt has been trouble by gout and is on steroids. She feels a little anxious and her B/P is higher than normal. Her PCP is handling this. She has not had any recurrent syncope. She has been Amiodarone for a few months now.    Current Outpatient Prescriptions  Medication Sig Dispense Refill  . allopurinol (ZYLOPRIM) 300 MG tablet Take 1 tablet (300 mg total) by mouth daily. 30 tablet 11  . amLODipine (NORVASC) 5 MG tablet Take 1 tablet (5 mg total) by mouth daily. 90 tablet 3  . apixaban (ELIQUIS) 2.5 MG TABS tablet Take 1 tablet (2.5 mg total) by mouth 2 (two) times daily.    . CVS VITAMIN B12 1000 MCG tablet TAKE 1 TABLET BY MOUTH EVERY DAY 30 tablet 6  . furosemide (LASIX) 40 MG tablet Take 1 tablet (40 mg total) by mouth daily as needed for fluid (more than 2-3 pounds overnight and/or 5 pounds in a week).    . hydrALAZINE (APRESOLINE) 100 MG tablet Take 100 mg by mouth 2 (two) times daily.    . hyoscyamine (LEVSIN, ANASPAZ) 0.125 MG tablet Take 1-2 tablets by mouth or under tongue every 4 hours as needed before meals 360 tablet 11  . isosorbide mononitrate (IMDUR) 60 MG 24 hr tablet TAKE 1 TABLET (60 MG TOTAL) BY MOUTH DAILY. 30 tablet 10  . levothyroxine (SYNTHROID, LEVOTHROID) 88 MCG tablet TAKE 1 TABLET (88 MCG TOTAL) BY MOUTH DAILY BEFORE BREAKFAST. 90 tablet 1  . LORazepam (ATIVAN) 0.5 MG tablet TAKE 1 TABLET BY MOUTH EVERY 8 HOURS AS NEEDED 90 tablet 5  . nitroGLYCERIN (NITROSTAT) 0.4 MG SL tablet Place  1 tablet (0.4 mg total) under the tongue every 5 (five) minutes x 3 doses as needed for chest pain. 25 tablet 3  . omeprazole (PRILOSEC) 40 MG capsule Take 1 capsule (40 mg total) by mouth daily. 30 capsule 11  . predniSONE (DELTASONE) 10 MG tablet Take 10 mg by mouth as directed.    . temazepam (RESTORIL) 30 MG capsule Take 30 mg by mouth at bedtime as needed.    . traZODone (DESYREL) 50 MG tablet Take 50 mg by mouth at bedtime.     No current facility-administered medications for this visit.    Allergies  Allergen Reactions  . Ambien [Zolpidem Tartrate]     " made her crazy "  . Amoxicillin     REACTION: unspecified  . Ceftin [Cefuroxime Axetil] Diarrhea  . Codeine Phosphate     REACTION: unspecified  . Colchicine     Severe diarrhea  . Lipitor [Atorvastatin] Other (See Comments)    "makes legs jump all night"  . Penicillins     REACTION: nausea, swelling  . Xifaxan [Rifaximin] Other (See Comments)    unknown  . Prednisone Diarrhea    Social History   Social History  . Marital Status: Widowed    Spouse Name: N/A  .  Number of Children: 2  . Years of Education: N/A   Occupational History  . retired    Social History Main Topics  . Smoking status: Never Smoker   . Smokeless tobacco: Never Used     Comment: never used tobacco  . Alcohol Use: No  . Drug Use: No  . Sexual Activity: No   Other Topics Concern  . Not on file   Social History Narrative   She is a widowed mother of 2, one child is deceased. Grandmother of one. She appears to be working out routinely at [employer] using [employer]. She is otherwise quite active.   She does not drink alcohol, does not smoke.  She is retired from [employer].    Lives alone in a one story condo.     Review of Systems: General: negative for chills, fever, night sweats or weight changes.  Cardiovascular: negative for chest pain, dyspnea on exertion, edema, orthopnea, palpitations, paroxysmal  nocturnal dyspnea or shortness of breath Lower extremity edema (mild) felt to be secondary to Norvasc Dermatological: negative for rash Respiratory: negative for cough or wheezing Urologic: negative for hematuria Abdominal: negative for nausea, vomiting, diarrhea, bright red blood per rectum, melena, or hematemesis Neurologic: negative for visual changes, syncope, or dizziness All other systems reviewed and are otherwise negative except as noted above.    Blood pressure 170/60, pulse 95, height 5\' 4"  (1.626 m), weight 129 lb 9.6 oz (58.786 kg).  General appearance: alert, cooperative and no distress Neck: no carotid bruit and no JVD Lungs: clear to auscultation bilaterally Heart: regular rate and rhythm Extremities: trace edema Neurologic: Grossly normal  EKG A paced at 88  ASSESSMENT AND PLAN:   Paroxysmal atrial fibrillation Intermittent through the years and recurrent in December of 2014 treated with amiodarone- this was stopped when she had heart block and syncope requiring pacemaker June 2016   Chronic anticoagulation- Eliquis CHADs VASc = 4  CKD (chronic kidney disease), stage IV (HCC) GFR 16  Hypothyroidism TSH was NL June 2016 (while on Amiodarone)  Cardiac pacemaker in situ MDT PTVDP May 01 2015  Gout On steroids for gout for the past several weeks  Hypertensive heart disease Sub optimal control   PLAN  She is actually taking Hydralazine 150 mg BID, not 100 mg TID- she cannot remember to take the afternoon dose. She is a little "unsteady" on her feet, no syncope. No chest pain or dyspnea. Gout has been her main problem since her pacemaker.            I added Coreg 6.25 mg BID for HTN and HR control and told her to take Hydralazine 100 mg BID. I ordered a TSH as well. She can f/u with [name] in 2 months.  Emmerson Taddei K PA-C 09/01/2015 2:10 PM

## 2015-09-01 NOTE — Assessment & Plan Note (Signed)
On steroids for gout for the past several weeks

## 2015-09-01 NOTE — Assessment & Plan Note (Signed)
TSH was NL June 2016 (while on Amiodarone)

## 2015-09-01 NOTE — Assessment & Plan Note (Signed)
MDT PTVDP May 01 2015 

## 2015-09-01 NOTE — Assessment & Plan Note (Signed)
Intermittent through the years and recurrent in December of 2014 treated with amiodarone- this was stopped when she had heart block and syncope requiring pacemaker June 2016

## 2015-09-01 NOTE — Assessment & Plan Note (Signed)
CHADs VASc = 4 

## 2015-09-01 NOTE — Assessment & Plan Note (Signed)
Sub optimal control

## 2015-09-01 NOTE — Patient Instructions (Signed)
Your physician recommends that you return for lab work . You will not need to fast for this test.  Your physician has recommended you make the following change in your medication: start new prescription for carvedilol 6.25 mg this has been sent to your pharmacy. Be sure that you are taking hydralazine 100 mg twice a day instead of 150 mg twice a day.  Your physician recommends that you schedule a follow-up appointment in: 2 months with Dr. Ellyn Hack.  If you need a refill on your cardiac medications before your next appointment, please call your pharmacy.

## 2015-09-01 NOTE — Assessment & Plan Note (Signed)
GFR 16

## 2015-09-05 ENCOUNTER — Telehealth: Payer: Self-pay | Admitting: Cardiology

## 2015-09-05 NOTE — Telephone Encounter (Signed)
Received records from [facility] for appointment on 10/26/15 with [name].  Records given to [facility] (medical records) for Dr Allison Quarry schedule on 10/26/15. lp

## 2015-09-06 ENCOUNTER — Telehealth: Payer: Self-pay | Admitting: Cardiology

## 2015-09-06 NOTE — Telephone Encounter (Signed)
Pt called in stating that Hilda Blades called her and asked her to return her call. She believes that call is in regards to some blood results from yesterday. Please f/u  Thanks

## 2015-09-06 NOTE — Telephone Encounter (Signed)
Called pt and informed TSH was normal - pt voiced understanding.

## 2015-09-14 ENCOUNTER — Ambulatory Visit: Payer: Medicare Other

## 2015-09-14 DIAGNOSIS — Z23 Encounter for immunization: Secondary | ICD-10-CM

## 2015-09-20 ENCOUNTER — Other Ambulatory Visit: Payer: Self-pay | Admitting: Family Medicine

## 2015-09-26 ENCOUNTER — Encounter: Payer: Self-pay | Admitting: Family Medicine

## 2015-09-26 ENCOUNTER — Ambulatory Visit (INDEPENDENT_AMBULATORY_CARE_PROVIDER_SITE_OTHER): Payer: Medicare Other | Admitting: Family Medicine

## 2015-09-26 VITALS — BP 168/83 | HR 90 | Temp 97.6°F | Ht 64.0 in | Wt 139.0 lb

## 2015-09-26 DIAGNOSIS — R300 Dysuria: Secondary | ICD-10-CM

## 2015-09-26 DIAGNOSIS — N390 Urinary tract infection, site not specified: Secondary | ICD-10-CM

## 2015-09-26 LAB — POCT URINALYSIS DIPSTICK
BILIRUBIN UA: NEGATIVE
Glucose, UA: NEGATIVE
Ketones, UA: NEGATIVE
Nitrite, UA: NEGATIVE
SPEC GRAV UA: 1.02
Urobilinogen, UA: 0.2
pH, UA: 5.5

## 2015-09-26 MED ORDER — NITROFURANTOIN MONOHYD MACRO 100 MG PO CAPS: 100.0000 mg | ORAL_CAPSULE | Freq: Two times a day (BID) | ORAL | Status: DC

## 2015-09-26 NOTE — Progress Notes (Signed)
   Subjective:    Patient ID: [name], female    DOB: Sep 18, 1924, 81 y.o.   MRN: [id number]  HPI Here for 2 weeks of pelvic pains and increased frequency of urination. No fever. Drinking lots of water.    Review of Systems  Constitutional: Negative.   Respiratory: Negative.   Cardiovascular: Negative.   Gastrointestinal: Negative.   Genitourinary: Positive for dysuria, urgency and pelvic pain. Negative for flank pain.       Objective:   Physical Exam  Constitutional: She is oriented to person, place, and time. She appears well-developed and well-nourished.  Cardiovascular: Normal rate, regular rhythm, normal heart sounds and intact distal pulses.   Pulmonary/Chest: Effort normal and breath sounds normal.  Abdominal: Soft. Bowel sounds are normal. She exhibits no distension and no mass. There is no tenderness. There is no rebound and no guarding.  Neurological: She is alert and oriented to person, place, and time.          Assessment & Plan:  UTI, treat with Macrobid. Culture the sample.

## 2015-09-26 NOTE — Progress Notes (Signed)
Pre visit review using our clinic review tool, if applicable. No additional management support is needed unless otherwise documented below in the visit note. 

## 2015-09-28 LAB — URINE CULTURE: Colony Count: >=100000

## 2015-10-13 ENCOUNTER — Other Ambulatory Visit: Payer: Self-pay | Admitting: Orthopedic Surgery

## 2015-10-13 ENCOUNTER — Ambulatory Visit
Admission: RE | Admit: 2015-10-13 | Discharge: 2015-10-13 | Disposition: A | Discharge status: Home / Self Care | Payer: Medicare Other | Source: Ambulatory Visit | Attending: Orthopedic Surgery | Admitting: Orthopedic Surgery

## 2015-10-13 DIAGNOSIS — M25551 Pain in right hip: Secondary | ICD-10-CM

## 2015-10-26 ENCOUNTER — Ambulatory Visit: Payer: Medicare Other | Admitting: Cardiology

## 2015-10-26 ENCOUNTER — Telehealth: Payer: Self-pay | Admitting: Cardiology

## 2015-10-26 NOTE — Telephone Encounter (Signed)
°*  STAT* If patient is at the pharmacy, call can be transferred to refill team.   1. Which medications need to be refilled? (please list name of each medication and dose if known) Hydralazine 150 mg (needs a new prescription for the 150mg  )   2. Which pharmacy/location (including street and city if local pharmacy) is medication to be sent to?[facility] [address] [facility] [id number]  3. Do they need a 30 day or 90 day supply? [place]

## 2015-10-27 ENCOUNTER — Telehealth: Payer: Self-pay | Admitting: Family Medicine

## 2015-10-27 MED ORDER — HYDRALAZINE HCL 100 MG PO TABS: 100.0000 mg | ORAL_TABLET | Freq: Two times a day (BID) | ORAL | Status: DC

## 2015-10-27 NOTE — Telephone Encounter (Signed)
Spoke with patient and she already has prednisone on hand.  She will start taking the prednisone, and if she does not see any improvement she will make an appointment.

## 2015-10-27 NOTE — Telephone Encounter (Signed)
Patient Name: [name] DOB: April 29, 1924 Initial Comment Caller states was put on RX for gout, having frequent urination Nurse Assessment Nurse: Vallery Sa, RN, [place] Date/Time (Eastern Time): 10/27/2015 12:50:04 PM Confirm and document reason for call. If symptomatic, describe symptoms. ---Caller states she developed frequent urination about 2-3 weeks ago that improved after stopping her Gout medication. She developed pain and swelling in her right pinky finger this morning. No pain with urination or other urinary problems at this time. No fever. Has the patient traveled out of the country within the last 30 days? ---No Does the patient have any new or worsening symptoms? ---Yes Will a triage be completed? ---Yes Related visit to physician within the last 2 weeks? ---Yes Does the PT have any chronic conditions? (i.e. diabetes, asthma, etc.) ---Yes List chronic conditions. ---Gout, Pelvic fractures, Pacemaker, Heart problems, High Problems Is this a behavioral health or substance abuse call? ---No Guidelines Guideline Title Affirmed Question Affirmed Notes Finger Pain [1] Looks infected (spreading redness, red streak, pus) AND [2] severe pain with movement Final Disposition User Go to ED Now (or PCP triage) Vallery Sa, RN, [place] Referrals GO TO FACILITY REFUSED Disagree/Comply: Disagree Disagree/Comply Reason: Disagree with instructions Caller declined the Go to ER disposition. Reinforced the Go to ER disposition. Called the office backline and notified Seth Bake who states the MD or MD Nurse will call her back.

## 2015-10-27 NOTE — Telephone Encounter (Signed)
Left message on machine for patient to return our call 

## 2015-10-27 NOTE — Telephone Encounter (Signed)
She does NOT need to go to the ER for this. The "gout medication" she is referring to is probably allopurinol. I have never heard of this causing frequent urinations, but she can stay off this for the time being. As for the pinky finger pain, it may be gout coming back. Call in a Medrol dose pack and have her see me next week

## 2015-10-31 ENCOUNTER — Telehealth: Payer: Self-pay | Admitting: Cardiology

## 2015-10-31 ENCOUNTER — Ambulatory Visit (INDEPENDENT_AMBULATORY_CARE_PROVIDER_SITE_OTHER): Payer: Medicare Other | Admitting: *Deleted

## 2015-10-31 DIAGNOSIS — I441 Atrioventricular block, second degree: Secondary | ICD-10-CM | POA: Diagnosis not present

## 2015-10-31 NOTE — Telephone Encounter (Signed)
Spoke with pt and reminded pt of remote transmission that is due today. Pt verbalized understanding.   

## 2015-11-01 ENCOUNTER — Telehealth: Payer: Self-pay | Admitting: Family Medicine

## 2015-11-01 NOTE — Telephone Encounter (Signed)
[name] called saying she thought she had more Prednisone left than she really does. She only has two pills left and would like Dr. Sarajane Jews to send a Rx to her pharmacy for a refill. She needs it for her Gout and doesn't want to run out during the holidays. Please call the pt if necessary.  Pt's ph# [phone] Thank you.

## 2015-11-01 NOTE — Progress Notes (Signed)
Remote pacemaker transmission.   

## 2015-11-02 NOTE — Telephone Encounter (Signed)
Instead of Prednisone let her try using a Medrol dose pack when she gets a flare up. Call in one dose pack with 5 refills

## 2015-11-03 ENCOUNTER — Encounter: Payer: Self-pay | Admitting: Cardiology

## 2015-11-03 LAB — CUP PACEART REMOTE DEVICE CHECK
Battery Voltage: 3.03 V
Brady Statistic RA Percent Paced: 97.79 %
Brady Statistic RV Percent Paced: 0.11 %
Date Time Interrogation Session: 20161221013938
Implantable Lead Implant Date: 20160620
Implantable Lead Location: 753860
Implantable Lead Model: 5076
Implantable Lead Model: 5076
Lead Channel Impedance Value: 304 Ohm
Lead Channel Impedance Value: 475 Ohm
Lead Channel Pacing Threshold Amplitude: 0.625 V
Lead Channel Pacing Threshold Pulse Width: 0.4 ms
Lead Channel Pacing Threshold Pulse Width: 0.4 ms
Lead Channel Sensing Intrinsic Amplitude: 1.75 mV
Lead Channel Setting Pacing Pulse Width: 0.4 ms
Lead Channel Setting Sensing Sensitivity: 4 mV
MDC IDC LEAD IMPLANT DT: 20160620
MDC IDC LEAD LOCATION: 753859
MDC IDC MSMT BATTERY REMAINING LONGEVITY: 120 mo
MDC IDC MSMT LEADCHNL RA IMPEDANCE VALUE: 399 Ohm
MDC IDC MSMT LEADCHNL RA SENSING INTR AMPL: 1.75 mV
MDC IDC MSMT LEADCHNL RV IMPEDANCE VALUE: 380 Ohm
MDC IDC MSMT LEADCHNL RV PACING THRESHOLD AMPLITUDE: 0.625 V
MDC IDC MSMT LEADCHNL RV SENSING INTR AMPL: 17 mV
MDC IDC MSMT LEADCHNL RV SENSING INTR AMPL: 17 mV
MDC IDC SET LEADCHNL RA PACING AMPLITUDE: 1.5 V
MDC IDC SET LEADCHNL RV PACING AMPLITUDE: 2 V
MDC IDC STAT BRADY AP VP PERCENT: 0.09 %
MDC IDC STAT BRADY AP VS PERCENT: 97.69 %
MDC IDC STAT BRADY AS VP PERCENT: 0.01 %
MDC IDC STAT BRADY AS VS PERCENT: 2.2 %

## 2015-11-03 MED ORDER — METHYLPREDNISOLONE 4 MG PO TBPK: ORAL_TABLET | ORAL | Status: DC

## 2015-11-03 NOTE — Telephone Encounter (Signed)
I sent script e-scribe and left a voice message for pt with this information.  

## 2015-11-25 ENCOUNTER — Emergency Department (HOSPITAL_COMMUNITY)
Admission: EM | Admit: 2015-11-25 | Discharge: 2015-11-26 | Disposition: A | Discharge status: Home / Self Care | Payer: Medicare Other | Attending: Emergency Medicine | Admitting: Emergency Medicine

## 2015-11-25 ENCOUNTER — Encounter (HOSPITAL_COMMUNITY): Payer: Self-pay | Admitting: Emergency Medicine

## 2015-11-25 DIAGNOSIS — Z79899 Other long term (current) drug therapy: Secondary | ICD-10-CM | POA: Insufficient documentation

## 2015-11-25 DIAGNOSIS — Z8719 Personal history of other diseases of the digestive system: Secondary | ICD-10-CM | POA: Insufficient documentation

## 2015-11-25 DIAGNOSIS — Z7902 Long term (current) use of antithrombotics/antiplatelets: Secondary | ICD-10-CM | POA: Diagnosis not present

## 2015-11-25 DIAGNOSIS — E039 Hypothyroidism, unspecified: Secondary | ICD-10-CM | POA: Diagnosis not present

## 2015-11-25 DIAGNOSIS — Z880 Allergy status to penicillin: Secondary | ICD-10-CM | POA: Diagnosis not present

## 2015-11-25 DIAGNOSIS — R51 Headache: Secondary | ICD-10-CM | POA: Diagnosis not present

## 2015-11-25 DIAGNOSIS — F419 Anxiety disorder, unspecified: Secondary | ICD-10-CM | POA: Insufficient documentation

## 2015-11-25 DIAGNOSIS — M109 Gout, unspecified: Secondary | ICD-10-CM | POA: Insufficient documentation

## 2015-11-25 DIAGNOSIS — Z86010 Personal history of colonic polyps: Secondary | ICD-10-CM | POA: Diagnosis not present

## 2015-11-25 DIAGNOSIS — Z792 Long term (current) use of antibiotics: Secondary | ICD-10-CM | POA: Insufficient documentation

## 2015-11-25 DIAGNOSIS — J45909 Unspecified asthma, uncomplicated: Secondary | ICD-10-CM | POA: Insufficient documentation

## 2015-11-25 DIAGNOSIS — Z85048 Personal history of other malignant neoplasm of rectum, rectosigmoid junction, and anus: Secondary | ICD-10-CM | POA: Insufficient documentation

## 2015-11-25 DIAGNOSIS — R197 Diarrhea, unspecified: Secondary | ICD-10-CM | POA: Insufficient documentation

## 2015-11-25 DIAGNOSIS — I1310 Hypertensive heart and chronic kidney disease without heart failure, with stage 1 through stage 4 chronic kidney disease, or unspecified chronic kidney disease: Secondary | ICD-10-CM | POA: Diagnosis not present

## 2015-11-25 DIAGNOSIS — I4891 Unspecified atrial fibrillation: Secondary | ICD-10-CM | POA: Diagnosis not present

## 2015-11-25 DIAGNOSIS — Z862 Personal history of diseases of the blood and blood-forming organs and certain disorders involving the immune mechanism: Secondary | ICD-10-CM | POA: Diagnosis not present

## 2015-11-25 DIAGNOSIS — N184 Chronic kidney disease, stage 4 (severe): Secondary | ICD-10-CM | POA: Insufficient documentation

## 2015-11-25 DIAGNOSIS — R110 Nausea: Secondary | ICD-10-CM | POA: Diagnosis not present

## 2015-11-25 MED ORDER — ONDANSETRON 4 MG PO TBDP: 4.0000 mg | ORAL_TABLET | Freq: Once | ORAL | Status: DC | PRN

## 2015-11-25 MED ORDER — SODIUM CHLORIDE 0.9 % IV BOLUS (SEPSIS)
1000.0000 mL | Freq: Once | INTRAVENOUS | Status: AC
  Administered 2015-11-26: 1000 mL via INTRAVENOUS

## 2015-11-25 MED ORDER — ONDANSETRON HCL 4 MG/2ML IJ SOLN
4.0000 mg | Freq: Once | INTRAMUSCULAR | Status: AC
  Administered 2015-11-26: 4 mg via INTRAVENOUS
  Filled 2015-11-25: qty 2

## 2015-11-25 NOTE — ED Notes (Signed)
Patient went out to eat pizza at [employer]. Patient woke up with nausea and a bad headache. Patient started having diarrhea. Patient is not vomiting but is very nauseas.

## 2015-11-25 NOTE — ED Provider Notes (Signed)
CSN: [id number]     Arrival date & time 11/25/15  2256 History  By signing my name below, I, Evelene Croon, attest that this documentation has been prepared under the direction and in the presence of Leo Grosser, MD . Electronically Signed: Evelene Croon, Scribe. 11/26/2015. 12:11 AM.      Chief Complaint  Patient presents with  . Nausea  . Diarrhea   The history is provided by the patient. No language interpreter was used.     HPI Comments:  [name] is a 82 y.o. female with a history of HTN, IBS, CKD and multiple abdominal surgeries,  who presents to the Emergency Department complaining of nausea since this AM with associated diarrhea that began this afternoon; she notes 4-5 episodes. She also reports HA. Pt notes she had pizza and a salad from a salad bar last night. Pt denies vomiting, abdominal pain, and fever. No alleviating factors noted. She denies recent travel and recent use of antibiotics.    Past Medical History  Diagnosis Date  . Hyperlipidemia   . Hypothyroidism   . Arthritis   . Anemia   . Asthma   . Anxiety   . IBS (irritable bowel syndrome)   . Gout   . Colon polyps   . Tubulovillous adenoma 4/07  . Hemorrhoids   . Diverticulosis   . Chronic diarrhea   . Chronic kidney disease, stage IV (severe) ([place])   . Hypertensive heart disease     no significant RAS by MRA 2002- (<30% LRAS)   . Paroxysmal atrial fibrillation 11/01/2013    Intermittent through the years and recurrent in December of 2014 treated with amiodarone;; Negative Myoview 10/2013  . Peripheral vascular disease     123456 LICA, [id number] RICA by doppler 2009   . Benign positional vertigo   . Diastolic dysfunction, left ventricle 11/03/2013  . Syncope and collapse 04/28/2015  . Sinus arrest 04/28/2015  . LBBB (left bundle branch block)- new 11/01/13 11/01/2013  . LBBB (left bundle branch block)    Past Surgical History  Procedure Laterality Date  . Lumbar disc surgery  2000  . Squamous cell  skin cancer removed    . Oophorectomy  1962    right  . Appendectomy    . Low anterior bowel resection  7/08  . Cataract extraction    . Transthoracic echocardiogram  11/03/2013    Echocardiogram: EF 55-60%, mild LVH, elevated bili pressures. Mild aortic valve calcification.  Marland Kitchen Nm myoview ltd  11/02/2013    Negative for ischemia or infarction. EF 60%.  Marland Kitchen Ep implantable device N/A 05/01/2015    Procedure: Pacemaker Implant;  Surgeon: Deboraha Sprang, MD;  Location: [facility];  Service: Cardiovascular;  Laterality: N/A;  . Hip arthroplasty Left 05/01/2015    Procedure: ARTHROPLASTY BIPOLAR HIP (HEMIARTHROPLASTY);  Surgeon: Rod Can, MD;  Location: [facility];  Service: Orthopedics;  Laterality: Left;   Family History  Problem Relation Age of Onset  . Cancer Mother 94    unknown  . Heart attack Father   . Heart disease Brother   . Stroke Maternal Grandmother   . Lung cancer Maternal Grandfather   . Stroke Paternal Grandmother   . Colon cancer Daughter    Social History  Substance Use Topics  . Smoking status: Never Smoker   . Smokeless tobacco: Never Used     Comment: never used tobacco  . Alcohol Use: No   OB History    No data available  Review of Systems  Constitutional: Negative for fever.  Gastrointestinal: Positive for nausea and diarrhea. Negative for vomiting and anal bleeding.  Neurological: Positive for headaches.  All other systems reviewed and are negative.   Allergies  Ambien; Amoxicillin; Ceftin; Codeine phosphate; Colchicine; Lipitor; Penicillins; Xifaxan; and Prednisone  Home Medications   Prior to Admission medications   Medication Sig Start Date End Date Taking? Authorizing Provider  allopurinol (ZYLOPRIM) 300 MG tablet Take 1 tablet (300 mg total) by mouth daily. 08/25/15   [name], MD  amLODipine (NORVASC) 5 MG tablet Take 1 tablet (5 mg total) by mouth daily. 06/29/15   [name], MD  apixaban (ELIQUIS) 2.5 MG TABS tablet  Take 1 tablet (2.5 mg total) by mouth 2 (two) times daily. 05/03/15   Barton Dubois, MD  carvedilol (COREG) 6.25 MG tablet Take 1 tablet (6.25 mg total) by mouth 2 (two) times daily. 09/01/15   Erlene Quan, PA-C  [facility] VITAMIN B12 1000 MCG tablet TAKE 1 TABLET BY MOUTH EVERY DAY 08/03/15   [name], MD  furosemide (LASIX) 40 MG tablet Take 1 tablet (40 mg total) by mouth daily as needed for fluid (more than 2-3 pounds overnight and/or 5 pounds in a week). 05/03/15   Barton Dubois, MD  hydrALAZINE (APRESOLINE) 100 MG tablet Take 1 tablet (100 mg total) by mouth 2 (two) times daily. 10/27/15   Mihai Croitoru, MD  hyoscyamine (LEVSIN, ANASPAZ) 0.125 MG tablet Take 1-2 tablets by mouth or under tongue every 4 hours as needed before meals Patient not taking: Reported on 09/26/2015 06/01/15   Ladene Artist, MD  isosorbide mononitrate (IMDUR) 60 MG 24 hr tablet TAKE 1 TABLET (60 MG TOTAL) BY MOUTH DAILY. 03/14/15   [name], MD  levothyroxine (SYNTHROID, LEVOTHROID) 88 MCG tablet TAKE 1 TABLET (88 MCG TOTAL) BY MOUTH DAILY BEFORE BREAKFAST. 09/20/15   [name], MD  LORazepam (ATIVAN) 0.5 MG tablet TAKE 1 TABLET BY MOUTH EVERY 8 HOURS AS NEEDED 07/19/15   [name], MD  methylPREDNISolone (MEDROL DOSEPAK) 4 MG TBPK tablet Take as directed 11/03/15   [name], MD  nitrofurantoin, macrocrystal-monohydrate, (MACROBID) 100 MG capsule Take 1 capsule (100 mg total) by mouth 2 (two) times daily. 09/26/15   [name], MD  nitroGLYCERIN (NITROSTAT) 0.4 MG SL tablet Place 1 tablet (0.4 mg total) under the tongue every 5 (five) minutes x 3 doses as needed for chest pain. Patient not taking: Reported on 09/26/2015 07/31/14   Rogelia Mire, NP  omeprazole (PRILOSEC) 40 MG capsule Take 1 capsule (40 mg total) by mouth daily. 04/17/15   [name], MD  predniSONE (DELTASONE) 10 MG tablet Take 10 mg by mouth as directed. 06/08/15   Historical Provider, MD  temazepam (RESTORIL) 30 MG capsule Take  30 mg by mouth at bedtime as needed. 07/15/15   Historical Provider, MD   BP 178/76 mmHg  Pulse 96  Temp(Src) 98.4 F (36.9 C) (Oral)  Resp 13  Ht 5\' 4"  (1.626 m)  Wt 133 lb (60.328 kg)  BMI 22.82 kg/m2  SpO2 97% Physical Exam  Constitutional: She is oriented to person, place, and time. She appears well-developed and well-nourished. No distress.  HENT:  Head: Normocephalic.  Eyes: Conjunctivae are normal.  Neck: Neck supple. No tracheal deviation present.  Cardiovascular: Normal rate, regular rhythm and normal heart sounds.  Exam reveals no gallop and no friction rub.   No murmur heard. Pulmonary/Chest:  Effort normal and breath sounds normal. No respiratory distress. She has no wheezes. She has no rales.  Abdominal: Soft. She exhibits no distension. There is no tenderness. There is no rebound and no guarding.  Well healed midline surgical scar  Neurological: She is alert and oriented to person, place, and time.  Skin: Skin is warm and dry.  Psychiatric: She has a normal mood and affect.  Nursing note and vitals reviewed.   ED Course  Procedures   DIAGNOSTIC STUDIES:  Oxygen Saturation is 97% on RA, normal by my interpretation.    COORDINATION OF CARE:  11:56 PM Discussed treatment plan with pt at bedside and pt agreed to plan.  Labs Review Labs Reviewed  CBC WITH DIFFERENTIAL/PLATELET - Abnormal; Notable for the following:    RBC 3.29 (*)    Hemoglobin 9.9 (*)    HCT 31.3 (*)    Lymphs Abs 0.1 (*)    All other components within normal limits  COMPREHENSIVE METABOLIC PANEL - Abnormal; Notable for the following:    Glucose, Bld 104 (*)    BUN 31 (*)    Creatinine, Ser 1.34 (*)    ALT 12 (*)    GFR calc non Af Amer 34 (*)    GFR calc Af Amer 39 (*)    All other components within normal limits  LIPASE, BLOOD  I-STAT CG4 LACTIC ACID, ED    Imaging Review No results found. I have personally reviewed and evaluated these lab results as part of my medical  decision-making.   MDM   Final diagnoses:  Diarrhea, unspecified type    82 year old female with history of IBS, previous partial bowel resection for polyps and diverticulosis presents with increased diarrhea today and ongoing nausea. She ate at a pizza restaurant salad bar last night and woke up with symptoms that seemed to progress over the course of the day. She has a nonfocal abdominal examination, no leukocytosis, no fever or other signs of diverticulitis or infection currently. No indication for imaging during today's encounter. Suspect that diarrhea is food related based off of historical features and current symptoms. Recommended Pepto-Bismol for management of symptoms in the interim. Given a bolus of IV fluid for losses and Zofran for nausea with improvement. Plan to follow up with PCP as needed and return precautions discussed for worsening or new concerning symptoms.   I personally performed the services described in this documentation, which was scribed in my presence. The recorded information has been reviewed and is accurate.     Leo Grosser, MD 11/26/15 [phone]

## 2015-11-26 LAB — COMPREHENSIVE METABOLIC PANEL
ALBUMIN: 3.8 g/dL (ref 3.5–5.0)
ALT: 12 U/L — ABNORMAL LOW (ref 14–54)
ANION GAP: 10 (ref 5–15)
AST: 22 U/L (ref 15–41)
Alkaline Phosphatase: 124 U/L (ref 38–126)
BUN: 31 mg/dL — AB (ref 6–20)
CALCIUM: 9.2 mg/dL (ref 8.9–10.3)
CHLORIDE: 105 mmol/L (ref 101–111)
CO2: 23 mmol/L (ref 22–32)
CREATININE: 1.34 mg/dL — AB (ref 0.44–1.00)
GFR calc Af Amer: 39 mL/min — ABNORMAL LOW (ref >60)
GFR calc non Af Amer: 34 mL/min — ABNORMAL LOW (ref >60)
Glucose, Bld: 104 mg/dL — ABNORMAL HIGH (ref 65–99)
POTASSIUM: 4.4 mmol/L (ref 3.5–5.1)
SODIUM: 138 mmol/L (ref 135–145)
Total Bilirubin: 0.5 mg/dL (ref 0.3–1.2)
Total Protein: 6.9 g/dL (ref 6.5–8.1)

## 2015-11-26 LAB — CBC WITH DIFFERENTIAL/PLATELET
BASOS PCT: 0 %
Basophils Absolute: 0 10*3/uL (ref 0.0–0.1)
EOS ABS: 0 10*3/uL (ref 0.0–0.7)
EOS PCT: 0 %
HCT: 31.3 % — ABNORMAL LOW (ref 36.0–46.0)
Hemoglobin: 9.9 g/dL — ABNORMAL LOW (ref 12.0–15.0)
Lymphocytes Relative: 2 %
Lymphs Abs: 0.1 10*3/uL — ABNORMAL LOW (ref 0.7–4.0)
MCH: 30.1 pg (ref 26.0–34.0)
MCHC: 31.6 g/dL (ref 30.0–36.0)
MCV: 95.1 fL (ref 78.0–100.0)
MONO ABS: 0.4 10*3/uL (ref 0.1–1.0)
MONOS PCT: 7 %
NEUTROS PCT: 91 %
Neutro Abs: 5 10*3/uL (ref 1.7–7.7)
PLATELETS: 171 10*3/uL (ref 150–400)
RBC: 3.29 MIL/uL — ABNORMAL LOW (ref 3.87–5.11)
RDW: 15 % (ref 11.5–15.5)
WBC: 5.5 10*3/uL (ref 4.0–10.5)

## 2015-11-26 LAB — LIPASE, BLOOD: LIPASE: 33 U/L (ref 11–51)

## 2015-11-26 LAB — I-STAT CG4 LACTIC ACID, ED: LACTIC ACID, VENOUS: 0.91 mmol/L (ref 0.5–2.0)

## 2015-11-26 NOTE — Discharge Instructions (Signed)

## 2015-11-30 ENCOUNTER — Telehealth: Payer: Self-pay | Admitting: Cardiology

## 2015-11-30 NOTE — Telephone Encounter (Signed)
Good advice.  Lets see how that works.  \ [name], MD

## 2015-11-30 NOTE — Telephone Encounter (Signed)
Please call,pt have a lot going on with her blood pressure.She wants to give you details on what is happening.

## 2015-11-30 NOTE — Telephone Encounter (Signed)
Patient concerned with her BP and feeling a little light headed  1/16  123/54 HR 79  AM 1/17  162/67             AM          123/57             PM 1/18  162/72             AM          122/55 HR 66  AM   Patient feels fine when BP is elevated but when systolic is in the [id number] she feels light headed.  Reviewed medications.  She takes all her daily medications at one time except her synthroid which she takes first thing in the morning Patient is going to try taking her Imdur and Norvasc once a day medication in the early afternoon to see if it helps splitting up her medications instead of taking all at once.  Drinking 4 bottles of water a day.  Asked her to increase by one bottle.  Is not or has not taken any Lasix in quite awhile. Next OV 1/31

## 2015-12-01 ENCOUNTER — Other Ambulatory Visit: Payer: Self-pay | Admitting: Gastroenterology

## 2015-12-12 ENCOUNTER — Ambulatory Visit (INDEPENDENT_AMBULATORY_CARE_PROVIDER_SITE_OTHER): Payer: Medicare Other | Admitting: Cardiology

## 2015-12-12 ENCOUNTER — Encounter: Payer: Self-pay | Admitting: Cardiology

## 2015-12-12 VITALS — BP 120/80 | HR 84 | Ht 64.0 in | Wt 132.0 lb

## 2015-12-12 DIAGNOSIS — Z7901 Long term (current) use of anticoagulants: Secondary | ICD-10-CM

## 2015-12-12 DIAGNOSIS — Z950 Presence of cardiac pacemaker: Secondary | ICD-10-CM

## 2015-12-12 DIAGNOSIS — I480 Paroxysmal atrial fibrillation: Secondary | ICD-10-CM

## 2015-12-12 DIAGNOSIS — I110 Hypertensive heart disease with heart failure: Secondary | ICD-10-CM

## 2015-12-12 DIAGNOSIS — R55 Syncope and collapse: Secondary | ICD-10-CM | POA: Diagnosis not present

## 2015-12-12 DIAGNOSIS — I10 Essential (primary) hypertension: Secondary | ICD-10-CM | POA: Diagnosis not present

## 2015-12-12 NOTE — Patient Instructions (Signed)
NO CHANGE IN CURRENT MEDICATIONS.  Your physician recommends that you schedule a follow-up appointment in [place]  Your physician wants you to follow-up in [place].  You will receive a reminder letter in the mail two months in advance. If you don't receive a letter, please call our office to schedule the follow-up appointment.  If you need a refill on your cardiac medications before your next appointment, please call your pharmacy.

## 2015-12-12 NOTE — Progress Notes (Signed)
PCP: [name], MD  Clinic Note: Chief Complaint  Patient presents with  . Follow-up    no chest pain, no shortness of breath, some swelling, no cramping, no dizziness or lightheadedness  . Atrial Fibrillation    s/p PPM    HPI: [name] is a 82 y.o. female with a PMH below who presents today for ~ 6 month f/u of CAF. -- Now s/p PPM for Syncope with CHB in June 2016.  She had a syncopal event, fell and fractured her left hip. She was noted to have intermittent complete heart block and underwent Medtronic pacemaker placement.  [name] was last seen in October 2016 by [name] for routine 6 month follow-up.  He had a carvedilol 6.25 mg twice a day and reduce hydralazine to 100 mg twice a day.  Recent Hospitalizations: None  Studies Reviewed: --  05/01/2015: Medtronic MRI compatible pacemaker placed Dr. Caryl Comes --  Lab Results  Component Value Date   TSH 0.595 09/01/2015    Interval History: [name] presents today doing well without any major complaints. She had a fall spell back in November, but otherwise has not had any issues. At that time was mostly because she lost her footing, and it was no syncope or near syncope. Her major complaint today is actually bilateral foot pain is a burning to the pain. She is relatively active without any significant symptoms. In addition to her foot pain, she is also been bothered by gout.  No chest pain or shortness of breath with rest or exertion.  No PND, orthopnea or edema.  No palpitations, lightheadedness, dizziness, weakness or syncope/near syncope. No TIA/amaurosis fugax symptoms. No melena, hematochezia, hematuria, or epstaxis. No claudication.  ROS: A comprehensive was performed. Review of Systems  Constitutional: Negative for malaise/fatigue.  Eyes: Negative for blurred vision.  Respiratory: Negative.   Cardiovascular: Negative.        Per history of present illness  Gastrointestinal: Negative for heartburn.    Musculoskeletal: Positive for back pain and joint pain.       She has pain from her pelvic bone fractures.  Neurological: Negative for dizziness, focal weakness, weakness and headaches.       What sounds like neuropathic pain in her feet  Endo/Heme/Allergies: Bruises/bleeds easily.  Psychiatric/Behavioral: Negative.   All other systems reviewed and are negative.    Past Medical History  Diagnosis Date  . Hyperlipidemia   . Hypothyroidism   . Arthritis   . Anemia   . Asthma   . Anxiety   . IBS (irritable bowel syndrome)   . Gout   . Colon polyps   . Tubulovillous adenoma 4/07  . Hemorrhoids   . Diverticulosis   . Chronic diarrhea   . Chronic kidney disease, stage IV (severe) ([place])   . Hypertensive heart disease     no significant RAS by MRA 2002- (<30% LRAS)   . Paroxysmal atrial fibrillation 11/01/2013    Intermittent through the years and recurrent in December of 2014 treated with amiodarone;; Negative Myoview 10/2013  . Peripheral vascular disease     123456 LICA, [id number] RICA by doppler 2009   . Benign positional vertigo   . Diastolic dysfunction, left ventricle 11/03/2013  . Syncope and collapse 04/28/2015  . Sinus arrest 04/28/2015  . LBBB (left bundle branch block)- new 11/01/13 11/01/2013  . LBBB (left bundle branch block)     Past Surgical History  Procedure Laterality Date  . Lumbar disc surgery  2000  . Squamous cell skin cancer removed    . Oophorectomy  1962    right  . Appendectomy    . Low anterior bowel resection  7/08  . Cataract extraction    . Transthoracic echocardiogram  11/03/2013    Echocardiogram: EF 55-60%, mild LVH, elevated bili pressures. Mild aortic valve calcification.  Marland Kitchen Nm myoview ltd  11/02/2013    Negative for ischemia or infarction. EF 60%.  Marland Kitchen Ep implantable device N/A 05/01/2015    Procedure: Pacemaker Implant;  Surgeon: Deboraha Sprang, MD;  Location: [facility];  Service: Cardiovascular;  Laterality: N/A;  . Hip  arthroplasty Left 05/01/2015    Procedure: ARTHROPLASTY BIPOLAR HIP (HEMIARTHROPLASTY);  Surgeon: Rod Can, MD;  Location: [facility];  Service: Orthopedics;  Laterality: Left;    Prior to Admission medications   Medication Sig Start Date End Date Taking? Authorizing Provider  amLODipine (NORVASC) 5 MG tablet Take 1 tablet (5 mg total) by mouth daily. 06/29/15  Yes [name], MD  apixaban (ELIQUIS) 2.5 MG TABS tablet Take 1 tablet (2.5 mg total) by mouth 2 (two) times daily. 05/03/15  Yes Barton Dubois, MD  carvedilol (COREG) 6.25 MG tablet Take 1 tablet (6.25 mg total) by mouth 2 (two) times daily. 09/01/15  Yes Erlene Quan, PA-C  [facility] VITAMIN B12 1000 MCG tablet TAKE 1 TABLET BY MOUTH EVERY DAY 08/03/15  Yes [name], MD  furosemide (LASIX) 40 MG tablet Take 1 tablet (40 mg total) by mouth daily as needed for fluid (more than 2-3 pounds overnight and/or 5 pounds in a week). 05/03/15  Yes Barton Dubois, MD  hydrALAZINE (APRESOLINE) 100 MG tablet Take 1 tablet (100 mg total) by mouth 2 (two) times daily. 10/27/15  Yes Mihai Croitoru, MD  hyoscyamine (LEVSIN SL) 0.125 MG SL tablet TAKE 1-2 TABLETS BY MOUTH OR UNDER TONGUE EVERY 4 HOURS AS NEEDED BEFORE MEALS 12/04/15  Yes Ladene Artist, MD  isosorbide mononitrate (IMDUR) 60 MG 24 hr tablet TAKE 1 TABLET (60 MG TOTAL) BY MOUTH DAILY. 03/14/15  Yes [name], MD  levothyroxine (SYNTHROID, LEVOTHROID) 88 MCG tablet TAKE 1 TABLET (88 MCG TOTAL) BY MOUTH DAILY BEFORE BREAKFAST. 09/20/15  Yes [name], MD  LORazepam (ATIVAN) 0.5 MG tablet TAKE 1 TABLET BY MOUTH EVERY 8 HOURS AS NEEDED 07/19/15  Yes [name], MD  mupirocin cream (BACTROBAN) 2 % 1 APPLICATION APPLY ON THE SKIN TWICE A DAY 12/06/15  Yes Historical Provider, MD  nitroGLYCERIN (NITROSTAT) 0.4 MG SL tablet Place 1 tablet (0.4 mg total) under the tongue every 5 (five) minutes x 3 doses as needed for chest pain. 07/31/14  Yes Rogelia Mire, NP  omeprazole (PRILOSEC) 40 MG  capsule Take 1 capsule (40 mg total) by mouth daily. 04/17/15  Yes [name], MD  predniSONE (DELTASONE) 10 MG tablet Take 10 mg by mouth as directed. 06/08/15  Yes Historical Provider, MD  temazepam (RESTORIL) 30 MG capsule Take 30 mg by mouth at bedtime as needed. 07/15/15  Yes Historical Provider, MD   Allergies  Allergen Reactions  . Ambien [Zolpidem Tartrate]     " made her crazy "  . Amoxicillin     REACTION: unspecified  . Ceftin [Cefuroxime Axetil] Diarrhea  . Codeine Phosphate     REACTION: unspecified  . Colchicine     Severe diarrhea  . Lipitor [Atorvastatin] Other (See Comments)    "makes legs jump all night"  .  Penicillins     REACTION: nausea, swelling  . Xifaxan [Rifaximin] Other (See Comments)    unknown  . Prednisone Diarrhea    Social History   Social History  . Marital Status: Widowed    Spouse Name: N/A  . Number of Children: 2  . Years of Education: N/A   Occupational History  . retired    Social History Main Topics  . Smoking status: Never Smoker   . Smokeless tobacco: Never Used     Comment: never used tobacco  . Alcohol Use: No  . Drug Use: No  . Sexual Activity: No   Other Topics Concern  . None   Social History Narrative   She is a widowed mother of 2, one child is deceased. Grandmother of one. She appears to be working out routinely at [employer] using [employer]. She is otherwise quite active.   She does not drink alcohol, does not smoke.  She is retired from [employer].    Lives alone in a one story condo.   Family History  Problem Relation Age of Onset  . Cancer Mother 33    unknown  . Heart attack Father   . Heart disease Brother   . Stroke Maternal Grandmother   . Lung cancer Maternal Grandfather   . Stroke Paternal Grandmother   . Colon cancer Daughter     Wt Readings from Last 3 Encounters:  12/12/15 132 lb (59.875 kg)  11/25/15 133 lb (60.328 kg)  09/26/15 139 lb (63.05 kg)    PHYSICAL  EXAM BP 120/80 mmHg  Pulse 84  Ht 5\' 4"  (1.626 m)  Wt 132 lb (59.875 kg)  BMI 22.65 kg/m2 General appearance: alert, cooperative, appears stated age, no distress.  Well-nourished and well-groomed  Neck: no adenopathy, no carotid bruit and no JVD Lungs: clear to auscultation bilaterally, normal percussion bilaterally and non-labored Heart: RRR, S1 normal with mildly split S2. 1/6 SEM at RUSB.; No other murmur, click, rub or gallop; nondisplaced PMI  Abdomen: soft, non-tender; bowel sounds normal; no masses,  no organomegano HJR Extremities: extremities normal, atraumatic, no cyanosis, and edema -trace Pulses: 2+ and symmetric; Neurologic: Mental status: Alert, oriented, thought content appropriate Cranial nerves: normal (II-XII grossly intact)   Adult ECG Report  not performed   Other studies Reviewed: Additional studies/ records that were reviewed today include:  Recent Labs:   Lab Results  Component Value Date   CREATININE 1.34* 11/25/2015   ASSESSMENT / PLAN:  overall, she is doing relatively well without any active symptoms. We will continue current medications as her blood pressure and heart rate well controlled. No CMV heart failure symptoms or need for when necessary Lasix of late. She continues to stay active without notable symptoms.   Problem List Items Addressed This Visit    Systolic hypertension, isolated; difficult to control (Chronic)    Low pressure was great today. On amlodipine and carvedilol as well as 100 twice a day of hydralazine. She also has Imdur, which we could potentially stop. She takes when necessary Lasix for elevated blood pressure and swelling.      Syncope, cardiogenic (Chronic)    No further episodes since pacemaker placed. She had a fall without was not syncope related.      Syncope and collapse   Paroxysmal atrial fibrillation - Primary (Chronic)    No longer on amiodarone. She is now on carvedilol. Status post pacemaker so we are not  concerned with bradycardia. This  patients CHA2DS2-VASc Score and unadjusted Ischemic Stroke Rate (% per year) is equal to 7.2 % stroke rate/year from a score of 5 (See overview for calculation)  * Anticoagulated with Eliquis, no bleeding issues      Hypertensive heart disease (Chronic)    Adequate blood pressure control today. On when necessary Lasix for hypertension > 180 mmHg      Chronic anticoagulation- Eliquis (Chronic)    No bleeding symptoms.      Cardiac pacemaker in situ (Chronic)    Medtronic pacemaker June 2016. This will allow for titration of beta blocker if necessary.         Current medicines are reviewed at length with the patient today. (+/- concerns) none The following changes have been made: none  ROV - alternate 6 months with Mr. Rosalyn Gess & 6 months with Dr. Ellyn Hack  Studies Ordered:   No orders of the defined types were placed in this encounter.      [name], M.D., M.S. Interventional Cardiologist   Pager # [phone] Phone # [phone] [address]. [place], [place] [address]

## 2015-12-14 ENCOUNTER — Encounter: Payer: Self-pay | Admitting: Cardiology

## 2015-12-14 NOTE — Assessment & Plan Note (Signed)
Medtronic pacemaker June 2016. This will allow for titration of beta blocker if necessary.

## 2015-12-14 NOTE — Assessment & Plan Note (Signed)
Adequate blood pressure control today. On when necessary Lasix for hypertension > 180 mmHg

## 2015-12-14 NOTE — Assessment & Plan Note (Signed)
Low pressure was great today. On amlodipine and carvedilol as well as 100 twice a day of hydralazine. She also has Imdur, which we could potentially stop. She takes when necessary Lasix for elevated blood pressure and swelling.

## 2015-12-14 NOTE — Assessment & Plan Note (Signed)
No further episodes since pacemaker placed. She had a fall without was not syncope related.

## 2015-12-14 NOTE — Assessment & Plan Note (Signed)
No bleeding symptoms.

## 2015-12-14 NOTE — Assessment & Plan Note (Addendum)
No longer on amiodarone. She is now on carvedilol. Status post pacemaker so we are not concerned with bradycardia. This patients CHA2DS2-VASc Score and unadjusted Ischemic Stroke Rate (% per year) is equal to 7.2 % stroke rate/year from a score of 5 (See overview for calculation)  * Anticoagulated with Eliquis, no bleeding issues

## 2015-12-23 ENCOUNTER — Other Ambulatory Visit: Payer: Self-pay | Admitting: Family Medicine

## 2015-12-26 NOTE — Telephone Encounter (Signed)
Refill for 6 months. 

## 2016-01-09 ENCOUNTER — Encounter: Payer: Self-pay | Admitting: Physical Therapy

## 2016-01-09 ENCOUNTER — Other Ambulatory Visit: Payer: Self-pay | Admitting: Cardiology

## 2016-01-09 NOTE — Therapy (Signed)
[facility] [address] [place], [place], [address] Phone: [phone]   Fax:  [phone]  Patient Details  Name: [name] MRN: [id number] Date of Birth: 03/12/1924 Referring Provider:  [name], MD  Encounter Date: 01/09/2016  PHYSICAL THERAPY DISCHARGE SUMMARY  Visits from Start of Care: 8  Current functional level related to goals / functional outcomes:     PT Short Term Goals - 01/23/15 1702    PT SHORT TERM GOAL #1   Title demonstrates understanding of initial HEP (Target Date: 01/27/15)   Baseline MET 01/23/15   Time 4   Period Weeks   Status Achieved   PT SHORT TERM GOAL #2   Title Berg Balance >40/56 (Target Date: 01/27/15)   Baseline MET 01/23/15 Merrilee Jansky 43/56 (Initial was 31/56)   Time 4   Period Weeks   Status Achieved   PT SHORT TERM GOAL #3   Title Functional Gait Assessment >20/30 (Target Date: 01/27/15)   Baseline MET 01/23/15 FGA 22/30 (Initial was 15/30)   Time 4   Period Weeks   Status Achieved         PT Long Term Goals - 02/01/15 1233    PT LONG TERM GOAL #1   Title Patient verbalizes understanding of ongoing fitness plan / progressive HEP (Target Date: 02/24/15)   Baseline 3/21- has returned to [place] #2   Title verbalizes fall prevention strategies within home (Target Date: 02/24/15)   Baseline 3/23 MET   Status Achieved   PT LONG TERM GOAL #3   Title Berg Balance >/=45/56 (Target Date: 02/24/15)   Baseline 3/21- met with score of 50/56   Status Achieved   PT LONG TERM GOAL #4   Title Functional Gait Assessment >/= 23/30  (Target Date: 02/24/15)   Baseline 3/21- met with score of 28/30   Status Achieved        Remaining deficits: See above   Education / Equipment: Fall Prevention strategies & HEP Plan: Patient agrees to discharge.  Patient goals were met. Patient is being discharged due to meeting the stated rehab goals.  ?????         Moustapha Tooker PT, DPT 01/09/2016, 10:49 AM  [facility] [address] [place], [place], [address] Phone: [phone]   Fax:  [phone]

## 2016-01-17 ENCOUNTER — Telehealth: Payer: Self-pay | Admitting: Family Medicine

## 2016-01-17 ENCOUNTER — Other Ambulatory Visit: Payer: Self-pay | Admitting: Family Medicine

## 2016-01-17 MED ORDER — METHYLPREDNISOLONE 4 MG PO TBPK: ORAL_TABLET | ORAL | Status: DC

## 2016-01-17 NOTE — Telephone Encounter (Signed)
Pt said her gout has flared up and is asking for rx    Pharmacy [facility]

## 2016-01-17 NOTE — Telephone Encounter (Signed)
Call in a Medrol dose pack  

## 2016-01-17 NOTE — Telephone Encounter (Signed)
I sent script e-scribe and spoke with pt. 

## 2016-01-22 ENCOUNTER — Other Ambulatory Visit: Payer: Self-pay | Admitting: Family Medicine

## 2016-01-26 ENCOUNTER — Other Ambulatory Visit: Payer: Self-pay | Admitting: Family Medicine

## 2016-01-26 NOTE — Telephone Encounter (Signed)
Spoke with pharmacy - they stated they did not see that the patient had 5 refills. Pharmacist apologized and states she will delete requested. Thanks!

## 2016-01-26 NOTE — Telephone Encounter (Signed)
Pt request refill of the following:   temazepam (RESTORIL) 30 MG capsule   Pt said she is out and need this med today   Phamacy: [facility] [address]

## 2016-01-30 ENCOUNTER — Telehealth: Payer: Self-pay | Admitting: Cardiology

## 2016-01-30 ENCOUNTER — Ambulatory Visit (INDEPENDENT_AMBULATORY_CARE_PROVIDER_SITE_OTHER): Payer: Medicare Other | Admitting: *Deleted

## 2016-01-30 DIAGNOSIS — I441 Atrioventricular block, second degree: Secondary | ICD-10-CM

## 2016-01-30 DIAGNOSIS — Z950 Presence of cardiac pacemaker: Secondary | ICD-10-CM | POA: Diagnosis not present

## 2016-01-30 NOTE — Telephone Encounter (Signed)
Spoke with pt and reminded pt of remote transmission that is due today. Pt verbalized understanding.   

## 2016-01-31 NOTE — Progress Notes (Signed)
Remote pacemaker transmission.   

## 2016-02-05 ENCOUNTER — Other Ambulatory Visit: Payer: Self-pay | Admitting: Cardiovascular Disease

## 2016-02-13 ENCOUNTER — Encounter: Payer: Self-pay | Admitting: Family Medicine

## 2016-02-13 ENCOUNTER — Ambulatory Visit (INDEPENDENT_AMBULATORY_CARE_PROVIDER_SITE_OTHER): Payer: Medicare Other | Admitting: Family Medicine

## 2016-02-13 ENCOUNTER — Telehealth: Payer: Self-pay | Admitting: Family Medicine

## 2016-02-13 VITALS — BP 160/70 | HR 90 | Temp 97.8°F | Ht 64.0 in | Wt 135.0 lb

## 2016-02-13 DIAGNOSIS — M1990 Unspecified osteoarthritis, unspecified site: Secondary | ICD-10-CM

## 2016-02-13 MED ORDER — PREDNISONE 10 MG PO TABS: 10.0000 mg | ORAL_TABLET | ORAL | Status: DC

## 2016-02-13 NOTE — Progress Notes (Signed)
Pre visit review using our clinic review tool, if applicable. No additional management support is needed unless otherwise documented below in the visit note. 

## 2016-02-13 NOTE — Telephone Encounter (Signed)
Patient Name: [name] DOB: Oct 07, 1924 Initial Comment Caller states hand is swollen and painful Nurse Assessment Nurse: Roosvelt Maser, RN, Barnetta Chapel Date/Time (Eastern Time): 02/13/2016 9:10:55 AM Confirm and document reason for call. If symptomatic, describe symptoms. You must click the next button to save text entered. ---caller states right hand has been hurting for a few weeks thinks her arthritis is acting up and last night it got worse. 7/10, no redness , positive swelling. also has history of gout but has never had it in her hand before Has the patient traveled out of the country within the last 30 days? ---Not Applicable Does the patient have any new or worsening symptoms? ---Yes Will a triage be completed? ---Yes Related visit to physician within the last 2 weeks? ---No Does the PT have any chronic conditions? (i.e. diabetes, asthma, etc.) ---Yes List chronic conditions. ---arrhythmia, pace maker, gout Is this a behavioral health or substance abuse call? ---No Guidelines Guideline Title Affirmed Question Affirmed Notes Hand and Wrist Pain [1] MODERATE pain (e.g., interferes with normal activities) AND [2] present > 3 days Final Disposition User See PCP When Office is Open (within 3 days) Roosvelt Maser, RN, Barnetta Chapel Referrals REFERRED TO PCP OFFICE    APPT scheduled for 11:30 dr Sarajane Jews

## 2016-02-13 NOTE — Progress Notes (Signed)
   Subjective:    Patient ID: [name], female    DOB: 12/11/1923, 82 y.o.   MRN: [id number]  HPI Here for one week of pain and swelling in the right hand. She has been taking Ibuprofen and applying Aspercreme with little results. She has gout and we tried her on Allopurinol a few months ago but she quickly stopped this due to GI side effects. She usually responds well to short courses of prednisone. She certainly has some underlying osteoarthritis as well.   Review of Systems  Constitutional: Negative.   Musculoskeletal: Positive for joint swelling and arthralgias.       Objective:   Physical Exam  Constitutional: She appears well-developed and well-nourished.  Musculoskeletal:  She has tender swelling in all the DIPs and PIPs of both hands. She is even more tender today in the MCP and the [place] joints if the right thumb           Assessment & Plan:  Gout on top of osteoarthritis. Given a supply of 10 mg Prednisone to use prn. We will refer her to Rheumatology for further evaluation.  [name], MD

## 2016-02-27 ENCOUNTER — Other Ambulatory Visit: Payer: Self-pay | Admitting: *Deleted

## 2016-02-27 MED ORDER — NITROGLYCERIN 0.4 MG SL SUBL: 0.4000 mg | SUBLINGUAL_TABLET | SUBLINGUAL | Status: DC | PRN

## 2016-02-27 NOTE — Telephone Encounter (Signed)
Rx request sent to pharmacy.  

## 2016-02-29 LAB — CUP PACEART REMOTE DEVICE CHECK
Battery Remaining Longevity: 116 mo
Battery Voltage: 3.03 V
Brady Statistic AP VS Percent: 97 %
Brady Statistic AS VS Percent: 2.76 %
Implantable Lead Implant Date: 20160620
Implantable Lead Location: 753859
Implantable Lead Model: 5076
Lead Channel Impedance Value: 323 Ohm
Lead Channel Impedance Value: 456 Ohm
Lead Channel Pacing Threshold Amplitude: 0.75 V
Lead Channel Pacing Threshold Amplitude: 0.75 V
Lead Channel Pacing Threshold Pulse Width: 0.4 ms
Lead Channel Sensing Intrinsic Amplitude: 1.75 mV
Lead Channel Sensing Intrinsic Amplitude: 1.75 mV
Lead Channel Sensing Intrinsic Amplitude: 20.375 mV
Lead Channel Setting Pacing Amplitude: 2 V
MDC IDC LEAD IMPLANT DT: 20160620
MDC IDC LEAD LOCATION: 753860
MDC IDC MSMT LEADCHNL RA PACING THRESHOLD PULSEWIDTH: 0.4 ms
MDC IDC MSMT LEADCHNL RV IMPEDANCE VALUE: 399 Ohm
MDC IDC MSMT LEADCHNL RV IMPEDANCE VALUE: 494 Ohm
MDC IDC MSMT LEADCHNL RV SENSING INTR AMPL: 20.375 mV
MDC IDC SESS DTM: 20170321161943
MDC IDC SET LEADCHNL RA PACING AMPLITUDE: 1.5 V
MDC IDC SET LEADCHNL RV PACING PULSEWIDTH: 0.4 ms
MDC IDC SET LEADCHNL RV SENSING SENSITIVITY: 4 mV
MDC IDC STAT BRADY AP VP PERCENT: 0.08 %
MDC IDC STAT BRADY AS VP PERCENT: 0.16 %
MDC IDC STAT BRADY RA PERCENT PACED: 97.09 %
MDC IDC STAT BRADY RV PERCENT PACED: 0.24 %

## 2016-03-01 ENCOUNTER — Encounter: Payer: Self-pay | Admitting: Cardiology

## 2016-03-13 ENCOUNTER — Telehealth: Payer: Self-pay | Admitting: Internal Medicine

## 2016-03-13 NOTE — Telephone Encounter (Signed)
NEw Message  Pt requested to speak w/ only RN- concerning her f/u appt (7/31)- she wants to be sure w/ RN, that appt is not out too far. Please call back and discuss.

## 2016-03-13 NOTE — Telephone Encounter (Signed)
Returned patient's call.  Advised her that since her most recent scheduled transmission was on 01/30/16, the next date we can technically process another remote transmission is 05/01/16.  Explained to patient that if she is concerned about her device function, or if she is experiencing any symptoms related to her PPM, that we can do a remote check from home any time prior to her leaving town on 04/20/16 (just to ensure device is functioning appropriately).  Patient denies any cardiac symptoms at this time and states that she does not feel this is necessary, she was just hoping to be able to see Dr. Caryl Comes the first week of June.  Advised that the 06/10/16 appointment is his earliest available at this time.  Patient declines to schedule a remote transmission for 05/01/16 and states she will wait to see Dr. Caryl Comes in office on 06/10/16.  Patient verbalizes understanding of instructions to call with worsening symptoms, questions, or concerns.  She is appreciative of call.

## 2016-03-13 NOTE — Telephone Encounter (Signed)
Pt states she received notification that she was due to see [name] in June for pacemaker follow up. Pt states she was scheduled to see [name] 06/10/16 and was told this was his first available appointment. Pt states her pacemaker was last checked in March 2017. Pt states she is due to go out of town for a week starting June 10,2017. Pt states she is concerned about waiting until the end of July for pacemaker check, especially since she is going out of town for a week in June.  Pt advised I will forward to [facility] to follow up with her about timing of pacemaker follow up.

## 2016-03-21 ENCOUNTER — Other Ambulatory Visit: Payer: Self-pay | Admitting: Family Medicine

## 2016-04-12 ENCOUNTER — Telehealth: Payer: Self-pay | Admitting: Cardiology

## 2016-04-12 NOTE — Telephone Encounter (Signed)
Received records from [facility] for appointment with [name] on 04/16/16.  Records given to [facility] (medical records) for Dr Allison Quarry schedule on 04/16/16. lp

## 2016-04-16 ENCOUNTER — Encounter: Payer: Self-pay | Admitting: Cardiology

## 2016-04-16 ENCOUNTER — Ambulatory Visit (INDEPENDENT_AMBULATORY_CARE_PROVIDER_SITE_OTHER): Payer: Medicare Other | Admitting: Cardiology

## 2016-04-16 ENCOUNTER — Other Ambulatory Visit: Payer: Self-pay | Admitting: Family Medicine

## 2016-04-16 VITALS — BP 136/72 | HR 71 | Ht 63.0 in | Wt 134.4 lb

## 2016-04-16 DIAGNOSIS — I1310 Hypertensive heart and chronic kidney disease without heart failure, with stage 1 through stage 4 chronic kidney disease, or unspecified chronic kidney disease: Secondary | ICD-10-CM

## 2016-04-16 DIAGNOSIS — I4510 Unspecified right bundle-branch block: Secondary | ICD-10-CM | POA: Diagnosis not present

## 2016-04-16 DIAGNOSIS — N184 Chronic kidney disease, stage 4 (severe): Secondary | ICD-10-CM

## 2016-04-16 DIAGNOSIS — I480 Paroxysmal atrial fibrillation: Secondary | ICD-10-CM

## 2016-04-16 DIAGNOSIS — I5032 Chronic diastolic (congestive) heart failure: Secondary | ICD-10-CM

## 2016-04-16 DIAGNOSIS — Z950 Presence of cardiac pacemaker: Secondary | ICD-10-CM | POA: Diagnosis not present

## 2016-04-16 DIAGNOSIS — I10 Essential (primary) hypertension: Secondary | ICD-10-CM

## 2016-04-16 NOTE — Progress Notes (Signed)
PCP: [name], MD  Clinic Note: Chief Complaint  Patient presents with  . Follow-up    6 month: pt states swelling has been better; h/o syncope--no recent episode. Recent fall no dizziness, just loss of balance  . Atrial Fibrillation    s/p PPM    HPI: [name] is a 82 y.o. female with a PMH below who presents today for ~ 6 month f/u of CAF. -- Now s/p PPM for Syncope with CHB in June 2016.  She had a syncopal event, fell and fractured her left hip. She was noted to have intermittent complete heart block and underwent Medtronic pacemaker placement.  [name] was last seen in January 2017.  Recent Hospitalizations: None  Studies Reviewed: -- None  Interval History: [name] presents today doing well without any major complaints. She is relatively active without any significant symptoms, and denies any significant symptoms of rapid irregular heartbeat/palpitations or dyspnea with rest or exertion. She is somewhat slow with air movement, but is able to get around. Her major complaint today is gout in her hands.  She notes that her swelling in her legs has improved. No chest pain or shortness of breath with rest or exertion.  No PND, orthopnea or edema.  No palpitations, lightheadedness, dizziness, weakness or syncope/near syncope. No TIA/amaurosis fugax symptoms. No melena, hematochezia, hematuria, or epstaxis. No claudication.  ROS: A comprehensive was performed. Review of Systems  Constitutional: Negative for malaise/fatigue.  Eyes: Negative for blurred vision.  Respiratory: Negative.   Cardiovascular: Negative.        Per history of present illness  Gastrointestinal: Negative for heartburn.  Musculoskeletal: Positive for back pain and joint pain.       She has pain from her pelvic bone fractures.  Neurological: Negative for dizziness, focal weakness, weakness and headaches.       What sounds like neuropathic pain in her feet  Endo/Heme/Allergies:  Bruises/bleeds easily.  Psychiatric/Behavioral: Negative.   All other systems reviewed and are negative.   Past Medical History  Diagnosis Date  . Hyperlipidemia   . Hypothyroidism   . Arthritis   . Anemia   . Asthma   . Anxiety   . IBS (irritable bowel syndrome)   . Gout   . Colon polyps   . Tubulovillous adenoma 4/07  . Hemorrhoids   . Diverticulosis   . Chronic diarrhea   . Chronic kidney disease, stage IV (severe) ([place])   . Hypertensive heart disease     no significant RAS by MRA 2002- (<30% LRAS)   . Paroxysmal atrial fibrillation 11/01/2013    Intermittent through the years and recurrent in December of 2014 treated with amiodarone;; Negative Myoview 10/2013  . Peripheral vascular disease     123456 LICA, XX123456 RICA by doppler 2009   . Benign positional vertigo   . Diastolic dysfunction, left ventricle 11/03/2013  . Syncope and collapse 04/28/2015    s/p PPM  . Sinus arrest 04/28/2015  . LBBB (left bundle branch block)- new 11/01/13 11/01/2013  . Cardiac pacemaker in situ 05/01/2015    for syncope & Sx Bradycardia -> Medtronic MRI compatible pacemaker placed Dr. Caryl Comes     Past Surgical History  Procedure Laterality Date  . Lumbar disc surgery  2000  . Squamous cell skin cancer removed    . Oophorectomy  1962    right  . Appendectomy    . Low anterior bowel resection  7/08  . Cataract extraction    .  Transthoracic echocardiogram  11/03/2013    Echocardiogram: EF 55-60%, mild LVH, elevated bili pressures. Mild aortic valve calcification.  Marland Kitchen Nm myoview ltd  11/02/2013    Negative for ischemia or infarction. EF 60%.  Marland Kitchen Ep implantable device N/A 05/01/2015    Procedure: Pacemaker Implant;  Surgeon: Deboraha Sprang, MD;  Location: [facility];  Service: Cardiovascular;  Laterality: N/A;  . Hip arthroplasty Left 05/01/2015    Procedure: ARTHROPLASTY BIPOLAR HIP (HEMIARTHROPLASTY);  Surgeon: Rod Can, MD;  Location: [facility];  Service: Orthopedics;  Laterality:  Left;    Prior to Admission medications   Medication Sig Start Date End Date Taking? Authorizing Provider  amLODipine (NORVASC) 5 MG tablet Take 1 tablet (5 mg total) by mouth daily. 06/29/15  Yes [name], MD  apixaban (ELIQUIS) 2.5 MG TABS tablet Take 1 tablet (2.5 mg total) by mouth 2 (two) times daily. 05/03/15  Yes Barton Dubois, MD  carvedilol (COREG) 6.25 MG tablet Take 1 tablet (6.25 mg total) by mouth 2 (two) times daily. 09/01/15  Yes Erlene Quan, PA-C  [facility] VITAMIN B12 1000 MCG tablet TAKE 1 TABLET BY MOUTH EVERY DAY 08/03/15  Yes [name], MD  furosemide (LASIX) 40 MG tablet Take 1 tablet (40 mg total) by mouth daily as needed for fluid (more than 2-3 pounds overnight and/or 5 pounds in a week). 05/03/15  Yes Barton Dubois, MD  hydrALAZINE (APRESOLINE) 100 MG tablet Take 1 tablet (100 mg total) by mouth 2 (two) times daily. 10/27/15  Yes Mihai Croitoru, MD  hyoscyamine (LEVSIN SL) 0.125 MG SL tablet TAKE 1-2 TABLETS BY MOUTH OR UNDER TONGUE EVERY 4 HOURS AS NEEDED BEFORE MEALS 12/04/15  Yes Ladene Artist, MD  isosorbide mononitrate (IMDUR) 60 MG 24 hr tablet TAKE 1 TABLET (60 MG TOTAL) BY MOUTH DAILY. 03/14/15  Yes [name], MD  levothyroxine (SYNTHROID, LEVOTHROID) 88 MCG tablet TAKE 1 TABLET (88 MCG TOTAL) BY MOUTH DAILY BEFORE BREAKFAST. 09/20/15  Yes [name], MD  LORazepam (ATIVAN) 0.5 MG tablet TAKE 1 TABLET BY MOUTH EVERY 8 HOURS AS NEEDED 07/19/15  Yes [name], MD  mupirocin cream (BACTROBAN) 2 % 1 APPLICATION APPLY ON THE SKIN TWICE A DAY 12/06/15  Yes Historical Provider, MD  nitroGLYCERIN (NITROSTAT) 0.4 MG SL tablet Place 1 tablet (0.4 mg total) under the tongue every 5 (five) minutes x 3 doses as needed for chest pain. 07/31/14  Yes Rogelia Mire, NP  omeprazole (PRILOSEC) 40 MG capsule Take 1 capsule (40 mg total) by mouth daily. 04/17/15  Yes [name], MD  predniSONE (DELTASONE) 10 MG tablet Take 10 mg by mouth as directed. 06/08/15  Yes  Historical Provider, MD  temazepam (RESTORIL) 30 MG capsule Take 30 mg by mouth at bedtime as needed. 07/15/15  Yes Historical Provider, MD   Allergies  Allergen Reactions  . Ambien [Zolpidem Tartrate]     " made her crazy "  . Amoxicillin     REACTION: unspecified  . Ceftin [Cefuroxime Axetil] Diarrhea  . Codeine Phosphate     REACTION: unspecified  . Colchicine     Severe diarrhea  . Lipitor [Atorvastatin] Other (See Comments)    "makes legs jump all night"  . Penicillins     REACTION: nausea, swelling  . Xifaxan [Rifaximin] Other (See Comments)    unknown  . Prednisone Diarrhea    Social History   Social History  . Marital Status: Widowed  Spouse Name: N/A  . Number of Children: 2  . Years of Education: N/A   Occupational History  . retired    Social History Main Topics  . Smoking status: Never Smoker   . Smokeless tobacco: Never Used     Comment: never used tobacco  . Alcohol Use: No  . Drug Use: No  . Sexual Activity: No   Other Topics Concern  . None   Social History Narrative   She is a widowed mother of 2, one child is deceased. Grandmother of one. She appears to be working out routinely at [employer] using [employer]. She is otherwise quite active.   She does not drink alcohol, does not smoke.  She is retired from [employer].    Lives alone in a one story condo.   Family History  Problem Relation Age of Onset  . Cancer Mother 46    unknown  . Heart attack Father   . Heart disease Brother   . Stroke Maternal Grandmother   . Lung cancer Maternal Grandfather   . Stroke Paternal Grandmother   . Colon cancer Daughter     Wt Readings from Last 3 Encounters:  04/16/16 134 lb 6.4 oz (60.963 kg)  02/13/16 135 lb (61.236 kg)  12/12/15 132 lb (59.875 kg)    PHYSICAL EXAM BP 136/72 mmHg  Pulse 71  Ht 5\' 3"  (1.6 m)  Wt 134 lb 6.4 oz (60.963 kg)  BMI 23.81 kg/m2 General appearance: alert, cooperative, appears stated age, no  distress.  Well-nourished and well-groomed  Neck: no adenopathy, no carotid bruit and no JVD Lungs: clear to auscultation bilaterally, normal percussion bilaterally and non-labored Heart: RRR, S1 normal with mildly split S2. 1/6 SEM at RUSB.; No other murmur, click, rub or gallop; nondisplaced PMI  Abdomen: soft, non-tender; bowel sounds normal; no masses,  no organomegano HJR Extremities: extremities normal, atraumatic, no cyanosis, and edema -trace Pulses: 2+ and symmetric; Neurologic: Mental status: Alert, oriented, thought content appropriate Cranial nerves: normal (II-XII grossly intact)   Adult ECG Report Atrial Paced Rhythm - prolong AV-conduction (PR 234), LBBB.   -- stable   Other studies Reviewed: Additional studies/ records that were reviewed today include:  Recent Labs:   Lab Results  Component Value Date   CREATININE 1.34* 11/25/2015   ASSESSMENT / PLAN:  overall, she is doing relatively well without any active symptoms. We will continue current medications as her blood pressure and heart rate well controlled. No CMV heart failure symptoms or need for when necessary Lasix of late. She continues to stay active without notable symptoms.   Problem List Items Addressed This Visit    Systolic hypertension, isolated; difficult to control - Primary (Chronic)   Relevant Orders   EKG 12-Lead (Completed)   Right bundle branch block (RBBB) intermittent    Relevant Orders   EKG 12-Lead (Completed)   Paroxysmal atrial fibrillation (Chronic)    Rate seems well controlled on current dose of carvedilol. His anticoagulated with ELIQUIS at 2.5 mg twice a day (will ask our pharmacist came to confirm that this is the appropriate dose for her. This patients CHA2DS2-VASc Score and unadjusted Ischemic Stroke Rate (% per year) is equal to 7.2 % stroke rate/year from a score of 5      Hypertensive heart/kidney disease w/chronic kidney disease stage IV ([place])    Well-controlled blood pressure  today. Heart failure seems to be well controlled as well. She is taking Lasix as ordered  with when necessary dosing but not having these much the way of the PRN dose. On carvedilol, drowsing plus Imdur as well as amlodipine. Renal function also seems to have improved.      Chronic diastolic heart failure (HCC) (Chronic)    On beta blocker and hydralazine/nitrate. Not ACE inhibitor/ARB due to renal insufficiency at baseline. No longer on amiodarone. Is on low-dose beta blocker. On standing dose of Lasix with additional when necessary doses.      Cardiac pacemaker in situ (Chronic)    Followed by Dr. Caryl Comes. She does telephonic monitoring. No further syncopal events since placement.         Current medicines are reviewed at length with the patient today. (+/- concerns) none The following changes have been made: none  ROV - alternate 6 months with Mr. Rosalyn Gess & 6 months with Dr. Ellyn Hack  Studies Ordered:   Orders Placed This Encounter  Procedures  . EKG 12-Lead      Glenetta Hew, M.D., M.S. Interventional Cardiologist   Pager # [phone] Phone # [phone] [address] [place], [place] [address]

## 2016-04-16 NOTE — Patient Instructions (Signed)
Your physician recommends that you schedule a follow-up appointment in: 6 months with Kerin Ransom, PA-C, 1 year with Dr. Ellyn Hack   If you need a refill on your cardiac medications before your next appointment, please call your pharmacy.

## 2016-04-17 NOTE — Telephone Encounter (Signed)
Call in #[phone] with 5 rf 

## 2016-04-17 NOTE — Telephone Encounter (Signed)
Pt requesting as refill. Last filed on 07/19/15 #90 +5, last OV 04/16/2016. Okay to refill?

## 2016-04-18 ENCOUNTER — Encounter: Payer: Self-pay | Admitting: Cardiology

## 2016-04-18 NOTE — Assessment & Plan Note (Signed)
Followed by Dr. Caryl Comes. She does telephonic monitoring. No further syncopal events since placement.

## 2016-04-18 NOTE — Assessment & Plan Note (Addendum)
Well-controlled blood pressure today. Heart failure seems to be well controlled as well. She is taking Lasix as ordered with when necessary dosing but not having these much the way of the PRN dose. On carvedilol, drowsing plus Imdur as well as amlodipine. Renal function also seems to have improved.

## 2016-04-18 NOTE — Assessment & Plan Note (Signed)
On beta blocker and hydralazine/nitrate. Not ACE inhibitor/ARB due to renal insufficiency at baseline. No longer on amiodarone. Is on low-dose beta blocker. On standing dose of Lasix with additional when necessary doses.

## 2016-04-18 NOTE — Assessment & Plan Note (Signed)
Rate seems well controlled on current dose of carvedilol. His anticoagulated with ELIQUIS at 2.5 mg twice a day (will ask our pharmacist came to confirm that this is the appropriate dose for her. This patients CHA2DS2-VASc Score and unadjusted Ischemic Stroke Rate (% per year) is equal to 7.2 % stroke rate/year from a score of 5

## 2016-04-30 ENCOUNTER — Other Ambulatory Visit: Payer: Self-pay | Admitting: Family Medicine

## 2016-05-06 ENCOUNTER — Other Ambulatory Visit: Payer: Self-pay | Admitting: Family Medicine

## 2016-05-31 ENCOUNTER — Ambulatory Visit (INDEPENDENT_AMBULATORY_CARE_PROVIDER_SITE_OTHER): Payer: Medicare Other | Admitting: Family Medicine

## 2016-05-31 ENCOUNTER — Encounter: Payer: Self-pay | Admitting: Family Medicine

## 2016-05-31 VITALS — BP 162/84 | HR 87 | Temp 98.2°F | Ht 63.0 in | Wt 133.0 lb

## 2016-05-31 DIAGNOSIS — M1990 Unspecified osteoarthritis, unspecified site: Secondary | ICD-10-CM

## 2016-05-31 DIAGNOSIS — J019 Acute sinusitis, unspecified: Secondary | ICD-10-CM

## 2016-05-31 DIAGNOSIS — G4700 Insomnia, unspecified: Secondary | ICD-10-CM

## 2016-05-31 MED ORDER — TRAMADOL HCL 50 MG PO TABS: ORAL_TABLET | ORAL | Status: DC

## 2016-05-31 MED ORDER — TEMAZEPAM 30 MG PO CAPS: 30.0000 mg | ORAL_CAPSULE | Freq: Every evening | ORAL | Status: DC | PRN

## 2016-05-31 MED ORDER — DOXYCYCLINE HYCLATE 100 MG PO CAPS: 100.0000 mg | ORAL_CAPSULE | Freq: Two times a day (BID) | ORAL | Status: AC

## 2016-05-31 NOTE — Progress Notes (Signed)
   Subjective:    Patient ID: [name], female    DOB: Dec 06, 1923, 82 y.o.   MRN: [id number]  HPI Here for several things. First her arthritis pain has become more severe and she has constant pain in the spine and hands. She takes Tylenol off and on. Also for 3 weeks she has had sinus congestion, headache, PND, and a dry cough. No fever.    Review of Systems  Constitutional: Negative.   HENT: Positive for congestion, ear pain, postnasal drip, sinus pressure and sore throat.   Eyes: Negative.   Respiratory: Positive for cough.   Musculoskeletal: Positive for back pain and arthralgias.       Objective:   Physical Exam  Constitutional: She appears well-developed and well-nourished.  HENT:  Right Ear: External ear normal.  Left Ear: External ear normal.  Nose: Nose normal.  Mouth/Throat: Oropharynx is clear and moist.  Eyes: Conjunctivae are normal.  Neck: Neck supple. No thyromegaly present.  Cardiovascular: Normal rate, regular rhythm, normal heart sounds and intact distal pulses.   Pulmonary/Chest: Effort normal and breath sounds normal.  Lymphadenopathy:    She has no cervical adenopathy.          Assessment & Plan:  Try Tramadol for arthritis pain. Try Doxycycline for the sinusitis.  [name], MD

## 2016-05-31 NOTE — Progress Notes (Signed)
Pre visit review using our clinic review tool, if applicable. No additional management support is needed unless otherwise documented below in the visit note. 

## 2016-06-06 ENCOUNTER — Other Ambulatory Visit: Payer: Self-pay | Admitting: Cardiology

## 2016-06-06 NOTE — Telephone Encounter (Signed)
REFILL 

## 2016-06-10 ENCOUNTER — Ambulatory Visit (INDEPENDENT_AMBULATORY_CARE_PROVIDER_SITE_OTHER): Payer: Medicare Other | Admitting: Internal Medicine

## 2016-06-10 ENCOUNTER — Encounter: Payer: Self-pay | Admitting: Internal Medicine

## 2016-06-10 VITALS — BP 155/77 | HR 75 | Ht 62.0 in | Wt 136.8 lb

## 2016-06-10 DIAGNOSIS — D649 Anemia, unspecified: Secondary | ICD-10-CM | POA: Diagnosis not present

## 2016-06-10 DIAGNOSIS — Z950 Presence of cardiac pacemaker: Secondary | ICD-10-CM | POA: Diagnosis not present

## 2016-06-10 DIAGNOSIS — I495 Sick sinus syndrome: Secondary | ICD-10-CM

## 2016-06-10 LAB — CUP PACEART INCLINIC DEVICE CHECK
Battery Remaining Longevity: 106 mo
Battery Voltage: 3.02 V
Brady Statistic RA Percent Paced: 97.41 %
Brady Statistic RV Percent Paced: 0.22 %
Date Time Interrogation Session: 20170731133945
Implantable Lead Implant Date: 20160620
Implantable Lead Location: 753860
Implantable Lead Model: 5076
Implantable Lead Model: 5076
Lead Channel Impedance Value: 513 Ohm
Lead Channel Pacing Threshold Amplitude: 1 V
Lead Channel Pacing Threshold Pulse Width: 0.4 ms
Lead Channel Sensing Intrinsic Amplitude: 2.5 mV
Lead Channel Sensing Intrinsic Amplitude: 2.5 mV
Lead Channel Setting Sensing Sensitivity: 4 mV
MDC IDC LEAD IMPLANT DT: 20160620
MDC IDC LEAD LOCATION: 753859
MDC IDC MSMT LEADCHNL RA IMPEDANCE VALUE: 323 Ohm
MDC IDC MSMT LEADCHNL RA IMPEDANCE VALUE: 456 Ohm
MDC IDC MSMT LEADCHNL RV IMPEDANCE VALUE: 437 Ohm
MDC IDC MSMT LEADCHNL RV PACING THRESHOLD AMPLITUDE: 0.75 V
MDC IDC MSMT LEADCHNL RV PACING THRESHOLD PULSEWIDTH: 0.4 ms
MDC IDC MSMT LEADCHNL RV SENSING INTR AMPL: 19.25 mV
MDC IDC MSMT LEADCHNL RV SENSING INTR AMPL: 20.25 mV
MDC IDC SET LEADCHNL RA PACING AMPLITUDE: 1.75 V
MDC IDC SET LEADCHNL RV PACING AMPLITUDE: 2 V
MDC IDC SET LEADCHNL RV PACING PULSEWIDTH: 0.4 ms
MDC IDC STAT BRADY AP VP PERCENT: 0.14 %
MDC IDC STAT BRADY AP VS PERCENT: 97.27 %
MDC IDC STAT BRADY AS VP PERCENT: 0.08 %
MDC IDC STAT BRADY AS VS PERCENT: 2.51 %

## 2016-06-10 NOTE — Progress Notes (Signed)
Patient Care Team: [name], MD as PCP - General   HPI  [name] is a 82 y.o. female Seen in follow-up for pacemaker implanted 6/16 for syncope in the context of left bundle branch block sinus arrest and PAF. She has had no recurrent syncope. She has no significant palpitations.  Exercise tolerance is much improved;  she does have significant fatigue which she thinks is worse following her morning medications.  Scant edema in the setting of gout  Records and Results Reviewed   Echo EF 55-60 2014   Past Medical History:  Diagnosis Date  . Anemia   . Anxiety   . Arthritis   . Asthma   . Benign positional vertigo   . Cardiac pacemaker in situ 05/01/2015   for syncope & Sx Bradycardia -> Medtronic MRI compatible pacemaker placed Dr. Caryl Comes   . Chronic diarrhea   . Chronic kidney disease, stage IV (severe) ([place])   . Colon polyps   . Diastolic dysfunction, left ventricle 11/03/2013  . Diverticulosis   . Gout   . Hemorrhoids   . Hyperlipidemia   . Hypertensive heart disease    no significant RAS by MRA 2002- (<30% LRAS)   . Hypothyroidism   . IBS (irritable bowel syndrome)   . LBBB (left bundle branch block)- new 11/01/13 11/01/2013  . Paroxysmal atrial fibrillation 11/01/2013   Intermittent through the years and recurrent in December of 2014 treated with amiodarone;; Negative Myoview 10/2013  . Peripheral vascular disease    123456 LICA, XX123456 RICA by doppler 2009   . Sinus arrest 04/28/2015  . Syncope and collapse 04/28/2015   s/p PPM  . Tubulovillous adenoma 4/07    Past Surgical History:  Procedure Laterality Date  . APPENDECTOMY    . CATARACT EXTRACTION    . EP IMPLANTABLE DEVICE N/A 05/01/2015   Procedure: Pacemaker Implant;  Surgeon: Deboraha Sprang, MD;  Location: [facility];  Service: Cardiovascular;  Laterality: N/A;  . HIP ARTHROPLASTY Left 05/01/2015   Procedure: ARTHROPLASTY BIPOLAR HIP (HEMIARTHROPLASTY);  Surgeon: Rod Can,  MD;  Location: [facility];  Service: Orthopedics;  Laterality: Left;  . LOW ANTERIOR BOWEL RESECTION  7/08  . [place] SURGERY  2000  . NM MYOVIEW LTD  11/02/2013   Negative for ischemia or infarction. EF 60%.  . OOPHORECTOMY  1962   right  . squamous cell skin cancer removed    . TRANSTHORACIC ECHOCARDIOGRAM  11/03/2013   Echocardiogram: EF 55-60%, mild LVH, elevated bili pressures. Mild aortic valve calcification.    Current Outpatient Prescriptions  Medication Sig Dispense Refill  . amLODipine (NORVASC) 5 MG tablet Take 1 tablet (5 mg total) by mouth daily. 90 tablet 3  . carvedilol (COREG) 6.25 MG tablet Take 1 tablet (6.25 mg total) by mouth 2 (two) times daily. 180 tablet 3  . [facility] VITAMIN B12 1000 MCG tablet TAKE 1 TABLET BY MOUTH EVERY DAY 90 tablet 3  . doxycycline (VIBRAMYCIN) 100 MG capsule Take 1 capsule (100 mg total) by mouth 2 (two) times daily. 20 capsule 0  . ELIQUIS 2.5 MG TABS tablet TAKE 1 TABLET BY MOUTH TWICE A DAY 60 tablet 0  . furosemide (LASIX) 40 MG tablet Take 1 tablet (40 mg total) by mouth daily as needed for fluid (more than 2-3 pounds overnight and/or 5 pounds in a week).    . hydrALAZINE (APRESOLINE) 100 MG tablet TAKE 1 TABLET (100 MG TOTAL) BY MOUTH 2 (  TWO) TIMES DAILY. 60 tablet 11  . hyoscyamine (LEVSIN SL) 0.125 MG SL tablet TAKE 1-2 TABLETS BY MOUTH OR UNDER TONGUE EVERY 4 HOURS AS NEEDED BEFORE MEALS 360 tablet 1  . levothyroxine (SYNTHROID, LEVOTHROID) 88 MCG tablet TAKE 1 TABLET (88 MCG TOTAL) BY MOUTH DAILY BEFORE BREAKFAST. 90 tablet 3  . LORazepam (ATIVAN) 0.5 MG tablet Take 0.5 mg by mouth every 8 (eight) hours as needed for anxiety or sleep.    . mupirocin cream (BACTROBAN) 2 % Reported on 05/31/2016  2  . nitroGLYCERIN (NITROSTAT) 0.4 MG SL tablet Place 1 tablet (0.4 mg total) under the tongue every 5 (five) minutes x 3 doses as needed for chest pain. 25 tablet 3  . temazepam (RESTORIL) 30 MG capsule Take 30 mg by mouth at bedtime as needed for  sleep.    . traMADol (ULTRAM) 50 MG tablet Take 1 or 2 tablets every 6 hours prn pain 120 tablet 2  . ULORIC 40 MG tablet Take 1 tablet by mouth daily.     No current facility-administered medications for this visit.     Allergies  Allergen Reactions  . Ambien [Zolpidem Tartrate]     " made her crazy "  . Amoxicillin     REACTION: unspecified  . Ceftin [Cefuroxime Axetil] Diarrhea  . Codeine Phosphate     REACTION: unspecified  . Colchicine     Severe diarrhea  . Lipitor [Atorvastatin] Other (See Comments)    "makes legs jump all night"  . Penicillins     REACTION: nausea, swelling  . Xifaxan [Rifaximin] Other (See Comments)    unknown      Review of Systems negative except from HPI and PMH  Physical Exam BP (!) 155/77   Pulse 75   Ht 5\' 2"  (1.575 m)   Wt 136 lb 12.8 oz (62.1 kg)   SpO2 96%   BMI 25.02 kg/m  Well developed and well nourished in no acute distress HENT normal E scleral and icterus clear Neck Supple JVP flat; carotids brisk and full Clear to ausculation Device pocket well healed; without hematoma or erythema.  There is no tethering Regular rate and rhythm, no murmurs gallops or rub Soft with active bowel sounds No clubbing cyanosis Trace Edema Alert and oriented, grossly normal motor and sensory function Skin Warm and Dry  ECG  Apcaing  Assessment and  Plan  Sinus node dysfunciton  Pacemaker- Medtronic The patient's device was interrogated and the information was fully reviewed.  The device was reprogrammed to  maixmize longevity  Fatigue  Hypertension  Anemia  Atrial tachycardia   we will hold her carvedilol as it may be contributing to her fatigue. If she is better next week, we will stop her amlodipine and use diltiazem as an alternative; she has struggled with constipation in the past). If she is no better she will resume her carvedilol as she has significant albeit asymptomatic atrial tachycardia  We will check CBC  And reanl  function

## 2016-06-10 NOTE — Patient Instructions (Addendum)
Medication Instructions: - Your physician has recommended you make the following change in your medication:  1) Hold coreg (carvedilol) X 1 week, then call and let Dr. Caryl Comes know how you are feeling off this medication- [phone]  Labwork: - Your physician recommends that you have lab work today: BMP/ CBC  Procedures/Testing: - none  Follow-Up: - Remote monitoring is used to monitor your Pacemaker of ICD from home. This monitoring reduces the number of office visits required to check your device to one time per year. It allows [place] to keep an eye on the functioning of your device to ensure it is working properly. You are scheduled for a device check from home on 09/09/16. You may send your transmission at any time that day. If you have a wireless device, the transmission will be sent automatically. After your physician reviews your transmission, you will receive a postcard with your next transmission date.  - Your physician wants you to follow-up in: 1 year with Tommye Standard, PA for Dr. Caryl Comes. You will receive a reminder letter in the mail two months in advance. If you don't receive a letter, please call our office to schedule the follow-up appointment.  Any Additional Special Instructions Will Be Listed Below (If Applicable).     If you need a refill on your cardiac medications before your next appointment, please call your pharmacy.

## 2016-06-11 LAB — CBC WITH DIFFERENTIAL/PLATELET
BASOS PCT: 0 %
Basophils Absolute: 0 cells/uL (ref 0–200)
EOS ABS: 45 {cells}/uL (ref 15–500)
EOS PCT: 1 %
HCT: 36 % (ref 35.0–45.0)
Hemoglobin: 11.6 g/dL — ABNORMAL LOW (ref 11.7–15.5)
LYMPHS PCT: 37 %
Lymphs Abs: 1665 cells/uL (ref 850–3900)
MCH: 30 pg (ref 27.0–33.0)
MCHC: 32.2 g/dL (ref 32.0–36.0)
MCV: 93 fL (ref 80.0–100.0)
MONOS PCT: 8 %
MPV: 10.4 fL (ref 7.5–12.5)
Monocytes Absolute: 360 cells/uL (ref 200–950)
NEUTROS ABS: 2430 {cells}/uL (ref 1500–7800)
Neutrophils Relative %: 54 %
PLATELETS: 221 10*3/uL (ref 140–400)
RBC: 3.87 MIL/uL (ref 3.80–5.10)
RDW: 14.3 % (ref 11.0–15.0)
WBC: 4.5 10*3/uL (ref 3.8–10.8)

## 2016-06-11 LAB — BASIC METABOLIC PANEL
BUN: 45 mg/dL — ABNORMAL HIGH (ref 7–25)
CALCIUM: 9.6 mg/dL (ref 8.6–10.4)
CHLORIDE: 104 mmol/L (ref 98–110)
CO2: 24 mmol/L (ref 20–31)
CREATININE: 1.45 mg/dL — AB (ref 0.60–0.88)
Glucose, Bld: 96 mg/dL (ref 65–99)
Potassium: 5.4 mmol/L — ABNORMAL HIGH (ref 3.5–5.3)
SODIUM: 140 mmol/L (ref 135–146)

## 2016-06-12 ENCOUNTER — Encounter: Payer: Self-pay | Admitting: Internal Medicine

## 2016-06-13 ENCOUNTER — Other Ambulatory Visit: Payer: Self-pay | Admitting: *Deleted

## 2016-06-13 DIAGNOSIS — E875 Hyperkalemia: Secondary | ICD-10-CM

## 2016-06-25 ENCOUNTER — Other Ambulatory Visit (INDEPENDENT_AMBULATORY_CARE_PROVIDER_SITE_OTHER): Payer: Medicare Other

## 2016-06-25 DIAGNOSIS — E875 Hyperkalemia: Secondary | ICD-10-CM

## 2016-06-25 LAB — BASIC METABOLIC PANEL
BUN: 42 mg/dL — AB (ref 7–25)
CHLORIDE: 105 mmol/L (ref 98–110)
CO2: 23 mmol/L (ref 20–31)
Calcium: 9.7 mg/dL (ref 8.6–10.4)
Creat: 1.65 mg/dL — ABNORMAL HIGH (ref 0.60–0.88)
Glucose, Bld: 81 mg/dL (ref 65–99)
POTASSIUM: 5.4 mmol/L — AB (ref 3.5–5.3)
SODIUM: 136 mmol/L (ref 135–146)

## 2016-06-28 ENCOUNTER — Encounter: Payer: Self-pay | Admitting: Family Medicine

## 2016-06-28 ENCOUNTER — Telehealth: Payer: Self-pay | Admitting: Family Medicine

## 2016-06-28 ENCOUNTER — Ambulatory Visit (INDEPENDENT_AMBULATORY_CARE_PROVIDER_SITE_OTHER): Payer: Medicare Other | Admitting: Family Medicine

## 2016-06-28 VITALS — BP 140/82 | HR 91 | Temp 97.9°F | Ht 62.5 in | Wt 133.5 lb

## 2016-06-28 DIAGNOSIS — R350 Frequency of micturition: Secondary | ICD-10-CM | POA: Diagnosis not present

## 2016-06-28 LAB — POCT URINALYSIS DIPSTICK
BILIRUBIN UA: NEGATIVE
Blood, UA: NEGATIVE
Glucose, UA: NEGATIVE
KETONES UA: NEGATIVE
Nitrite, UA: POSITIVE
PROTEIN UA: NEGATIVE
Spec Grav, UA: 1.025
Urobilinogen, UA: 0.2
pH, UA: 6

## 2016-06-28 NOTE — Telephone Encounter (Signed)
Patient was seen by Dr Elease Hashimoto today.

## 2016-06-28 NOTE — Progress Notes (Signed)
Pre visit review using our clinic review tool, if applicable. No additional management support is needed unless otherwise documented below in the visit note. 

## 2016-06-28 NOTE — Telephone Encounter (Signed)
Patient has appointment today with Dr. Elease Hashimoto at 4:30.

## 2016-06-28 NOTE — Progress Notes (Signed)
Subjective:     Patient ID: [name], female   DOB: [birth year], 82 y.o.   MRN: [id number]  HPI Patient is seen as a work in with 2 month history of some urine frequency. She was recently placed by her rheumatologist on Uloric and was initially concerned that may be triggering her symptoms. She's not had any burning with urination. No fever. No chills. Increased malaise. Apparently had urinalysis done per urology which she states showed "bacteria". She was called with those labs earlier today. She was treated recently for sinusitis some symptoms with doxycycline.  Past Medical History:  Diagnosis Date  . Anemia   . Anxiety   . Arthritis   . Asthma   . Benign positional vertigo   . Cardiac pacemaker in situ 05/01/2015   for syncope & Sx Bradycardia -> Medtronic MRI compatible pacemaker placed Dr. Caryl Comes   . Chronic diarrhea   . Chronic kidney disease, stage IV (severe) ([place])   . Colon polyps   . Diastolic dysfunction, left ventricle 11/03/2013  . Diverticulosis   . Gout   . Hemorrhoids   . Hyperlipidemia   . Hypertensive heart disease    no significant RAS by MRA 2002- (<30% LRAS)   . Hypothyroidism   . IBS (irritable bowel syndrome)   . LBBB (left bundle branch block)- new 11/01/13 11/01/2013  . Paroxysmal atrial fibrillation 11/01/2013   Intermittent through the years and recurrent in December of 2014 treated with amiodarone;; Negative Myoview 10/2013  . Peripheral vascular disease    123456 LICA, [id number] RICA by doppler 2009   . Sinus arrest 04/28/2015  . Syncope and collapse 04/28/2015   s/p PPM  . Tubulovillous adenoma 4/07   Past Surgical History:  Procedure Laterality Date  . APPENDECTOMY    . CATARACT EXTRACTION    . EP IMPLANTABLE DEVICE N/A 05/01/2015   Procedure: Pacemaker Implant;  Surgeon: Deboraha Sprang, MD;  Location: [facility];  Service: Cardiovascular;  Laterality: N/A;  . HIP ARTHROPLASTY Left 05/01/2015   Procedure: ARTHROPLASTY BIPOLAR HIP  (HEMIARTHROPLASTY);  Surgeon: Rod Can, MD;  Location: [facility];  Service: Orthopedics;  Laterality: Left;  . LOW ANTERIOR BOWEL RESECTION  7/08  . [place] SURGERY  2000  . NM MYOVIEW LTD  11/02/2013   Negative for ischemia or infarction. EF 60%.  . OOPHORECTOMY  1962   right  . squamous cell skin cancer removed    . TRANSTHORACIC ECHOCARDIOGRAM  11/03/2013   Echocardiogram: EF 55-60%, mild LVH, elevated bili pressures. Mild aortic valve calcification.    reports that she has never smoked. She has never used smokeless tobacco. She reports that she does not drink alcohol or use drugs. family history includes Cancer (age of onset: 28) in her mother; Colon cancer in her daughter; Heart attack in her father; Heart disease in her brother; Lung cancer in her maternal grandfather; Stroke in her maternal grandmother and paternal grandmother. Allergies  Allergen Reactions  . Ambien [Zolpidem Tartrate]     " made her crazy "  . Amoxicillin     REACTION: unspecified  . Ceftin [Cefuroxime Axetil] Diarrhea  . Codeine Phosphate     REACTION: unspecified  . Colchicine     Severe diarrhea  . Lipitor [Atorvastatin] Other (See Comments)    "makes legs jump all night"  . Penicillins     REACTION: nausea, swelling  . Xifaxan [Rifaximin] Other (See Comments)    unknown     Review of Systems  Constitutional: Positive for fatigue. Negative for chills and fever.  Genitourinary: Positive for frequency. Negative for hematuria.       Objective:   Physical Exam  Constitutional: She appears well-developed and well-nourished.  Cardiovascular: Normal rate and regular rhythm.   Pulmonary/Chest: Effort normal and breath sounds normal. No respiratory distress. She has no wheezes. She has no rales.       Assessment:     Urine frequency. Dipstick reveals positive nitrites but no blood and trace leukocytes    Plan:     -Urine culture sent. -Stay well-hydrated -We elected not to start  antibiotics since she has no fever, no burning with urination, and over 2 months of similar symptoms. Follow-up promptly for any fevers or new urinary symptoms    Eulas Post MD [facility]

## 2016-06-28 NOTE — Patient Instructions (Signed)
-  stay well hydrated -we will call you with culture results -follow up for any fever or increased fever

## 2016-06-28 NOTE — Telephone Encounter (Signed)
Patient Name: [name]  DOB: 1924-03-01    Initial Comment Caller states she has felt bad for the past couple of days and she did have blood work done. She is feeling off balance and when she gets up she feels dizzy. She does have some bacteria in her kidneys and wants to know if this could be the cause?   Nurse Assessment  Nurse: Mallie Mussel, RN, Alveta Heimlich Date/Time Eilene Ghazi Time): 06/28/2016 12:35:23 PM  Confirm and document reason for call. If symptomatic, describe symptoms. You must click the next button to save text entered. ---Caller states that she had a call from her urology office and was told she has bacteria in her kidney. She was told to follow up with her PCP about this. She has urinary burning/pain for the last couple days. She rates the pain as mild. She denies fever. Also, she has dizziness when she stands up. It doesn't last long. She does take BP med.  Has the patient traveled out of the country within the last 30 days? ---No  Does the patient have any new or worsening symptoms? ---Yes  Will a triage be completed? ---Yes  Related visit to physician within the last 2 weeks? ---No  Does the PT have any chronic conditions? (i.e. diabetes, asthma, etc.) ---Yes  List chronic conditions. ---HTN, Kidney Problems, Pacemaker  Is this a behavioral health or substance abuse call? ---No     Guidelines    Guideline Title Affirmed Question Affirmed Notes  Urination Pain - Female Artificial heart valve or artificial joint    Final Disposition User   See Physician within 4 Hours (or PCP triage) Mallie Mussel, RN, [place]    Comments  Dr. Sarajane Jews did not have anything available for the rest of today. I was able to schedule her with Dr. Carolann Littler at 4:30pm today.   Referrals  REFERRED TO PCP OFFICE   Disagree/Comply: Comply

## 2016-07-01 LAB — URINE CULTURE: Colony Count: >=100000

## 2016-07-01 MED ORDER — CIPROFLOXACIN HCL 500 MG PO TABS: 500.0000 mg | ORAL_TABLET | Freq: Two times a day (BID) | ORAL | 0 refills | Status: DC

## 2016-07-01 NOTE — Addendum Note (Signed)
Addended by: Elio Forget on: 07/01/2016 05:33 PM   Modules accepted: Orders

## 2016-07-12 ENCOUNTER — Telehealth: Payer: Self-pay | Admitting: Family Medicine

## 2016-07-12 NOTE — Telephone Encounter (Signed)
Please schedule pt per her request

## 2016-07-12 NOTE — Telephone Encounter (Signed)
atient Name: [name]  DOB: 1924-03-10    Initial Comment Caller states she has been having a stiff neck.   Nurse Assessment  Nurse: Raphael Gibney, RN, Vanita Ingles Date/Time (Eastern Time): 07/12/2016 12:06:34 PM  Confirm and document reason for call. If symptomatic, describe symptoms. You must click the next button to save text entered. ---Caller states she has a stiff neck has had stiff neck for about a month. Stiff neck is worse in the am. hurts to move it but she can move it. She can take tylenol.  Has the patient traveled out of the country within the last 30 days? ---Not Applicable  Does the patient have any new or worsening symptoms? ---Yes  Will a triage be completed? ---Yes  Related visit to physician within the last 2 weeks? ---No  Does the PT have any chronic conditions? (i.e. diabetes, asthma, etc.) ---Yes  List chronic conditions. ---pacemaker, HTN  Is this a behavioral health or substance abuse call? ---No     Guidelines    Guideline Title Affirmed Question Affirmed Notes  Neck Pain or Stiffness Neck pain present > 2 weeks    Final Disposition User   See PCP When Office is Open (within 3 days) Raphael Gibney, RN, Vanita Ingles    Comments  pt states it is supposed to storm this afternoon and she does not want to make appt for today. Would like to make appt for next Tuesday or Wednesday. Please call pt back regarding appt. She has dentist appt on Tuesday at noon.   Referrals  GO TO FACILITY REFUSED   Disagree/Comply: Disagree  Disagree/Comply Reason: Wait and see

## 2016-07-12 NOTE — Telephone Encounter (Signed)
Pt has been scheduled.  °

## 2016-07-16 ENCOUNTER — Ambulatory Visit (INDEPENDENT_AMBULATORY_CARE_PROVIDER_SITE_OTHER): Payer: Medicare Other | Admitting: Family Medicine

## 2016-07-16 ENCOUNTER — Encounter: Payer: Self-pay | Admitting: Family Medicine

## 2016-07-16 VITALS — BP 173/83 | HR 99 | Temp 97.6°F | Ht 62.5 in | Wt 136.0 lb

## 2016-07-16 DIAGNOSIS — G8929 Other chronic pain: Secondary | ICD-10-CM

## 2016-07-16 DIAGNOSIS — M542 Cervicalgia: Secondary | ICD-10-CM | POA: Diagnosis not present

## 2016-07-16 MED ORDER — CYCLOBENZAPRINE HCL 10 MG PO TABS: 10.0000 mg | ORAL_TABLET | Freq: Every day | ORAL | 5 refills | Status: DC

## 2016-07-16 NOTE — Progress Notes (Signed)
   Subjective:    Patient ID: [name], female    DOB: 06-25-1924, 82 y.o.   MRN: [id number]  HPI Here for one month of morning stiffness and pain in the neck. This loosens up a bit during the day. She has diffuse arthritis in the spine and takes Tylenol as needed. She cannot take NSAIDs however. No radiation of pain to the arms.    Review of Systems  Constitutional: Negative.   Musculoskeletal: Positive for neck pain and neck stiffness.  Neurological: Negative.        Objective:   Physical Exam  Constitutional: She appears well-developed and well-nourished. No distress.  Musculoskeletal:  The neck is not tender. ROM is somewhat limited by pain          Assessment & Plan:  Neck pain due to spasms. Try taking Flexeril at bedtime each night.  [name], MD

## 2016-07-16 NOTE — Progress Notes (Signed)
Pre visit review using our clinic review tool, if applicable. No additional management support is needed unless otherwise documented below in the visit note. 

## 2016-07-19 ENCOUNTER — Encounter: Payer: Self-pay | Admitting: Family Medicine

## 2016-07-26 ENCOUNTER — Other Ambulatory Visit: Payer: Self-pay | Admitting: Cardiology

## 2016-07-26 NOTE — Telephone Encounter (Signed)
Rx request sent to pharmacy.  

## 2016-08-21 ENCOUNTER — Ambulatory Visit (INDEPENDENT_AMBULATORY_CARE_PROVIDER_SITE_OTHER): Payer: Medicare Other | Admitting: Family Medicine

## 2016-08-21 DIAGNOSIS — Z23 Encounter for immunization: Secondary | ICD-10-CM

## 2016-08-22 ENCOUNTER — Telehealth: Payer: Self-pay | Admitting: Internal Medicine

## 2016-08-22 NOTE — Telephone Encounter (Signed)
Pt stated that she had bought a magnetic pouch to go around her waist band, pt wanted to make sure it was ok to use with a PPM. Instructed pt that as long as it was not near her pacemaker site that it will be fine to use. Pt voiced understanding.

## 2016-08-22 NOTE — Telephone Encounter (Signed)
New Message  Pt voiced purchased a pouch and it has a metal magnet strap.  Pt voiced instructions on the bag/pouch it states if you have a pace maker to contact Md.  Please f/u

## 2016-08-25 ENCOUNTER — Other Ambulatory Visit: Payer: Self-pay | Admitting: Cardiology

## 2016-09-09 ENCOUNTER — Telehealth: Payer: Self-pay | Admitting: Cardiology

## 2016-09-09 ENCOUNTER — Ambulatory Visit (INDEPENDENT_AMBULATORY_CARE_PROVIDER_SITE_OTHER): Payer: Medicare Other | Admitting: *Deleted

## 2016-09-09 DIAGNOSIS — I495 Sick sinus syndrome: Secondary | ICD-10-CM

## 2016-09-09 NOTE — Progress Notes (Signed)
Remote pacemaker transmission.   

## 2016-09-09 NOTE — Telephone Encounter (Signed)
Spoke with pt and reminded pt of remote transmission that is due today. Pt verbalized understanding.   

## 2016-09-13 ENCOUNTER — Encounter: Payer: Self-pay | Admitting: Cardiology

## 2016-09-13 ENCOUNTER — Other Ambulatory Visit: Payer: Self-pay | Admitting: Family Medicine

## 2016-09-19 LAB — CUP PACEART REMOTE DEVICE CHECK
Battery Remaining Longevity: 96 mo
Brady Statistic AP VS Percent: 95.43 %
Brady Statistic RV Percent Paced: 0.29 %
Date Time Interrogation Session: 20171030173008
Implantable Lead Implant Date: 20160620
Implantable Lead Implant Date: 20160620
Implantable Lead Location: 753859
Implantable Lead Model: 5076
Implantable Pulse Generator Implant Date: 20160620
Lead Channel Impedance Value: 418 Ohm
Lead Channel Impedance Value: 494 Ohm
Lead Channel Sensing Intrinsic Amplitude: 18.75 mV
Lead Channel Sensing Intrinsic Amplitude: 2.625 mV
Lead Channel Setting Pacing Amplitude: 1.75 V
Lead Channel Setting Pacing Amplitude: 2 V
Lead Channel Setting Pacing Pulse Width: 0.4 ms
Lead Channel Setting Sensing Sensitivity: 4 mV
MDC IDC LEAD LOCATION: 753860
MDC IDC MSMT BATTERY VOLTAGE: 3.01 V
MDC IDC MSMT LEADCHNL RA IMPEDANCE VALUE: 323 Ohm
MDC IDC MSMT LEADCHNL RA IMPEDANCE VALUE: 475 Ohm
MDC IDC MSMT LEADCHNL RA PACING THRESHOLD AMPLITUDE: 0.75 V
MDC IDC MSMT LEADCHNL RA PACING THRESHOLD PULSEWIDTH: 0.4 ms
MDC IDC MSMT LEADCHNL RA SENSING INTR AMPL: 2.625 mV
MDC IDC MSMT LEADCHNL RV PACING THRESHOLD AMPLITUDE: 0.75 V
MDC IDC MSMT LEADCHNL RV PACING THRESHOLD PULSEWIDTH: 0.4 ms
MDC IDC MSMT LEADCHNL RV SENSING INTR AMPL: 18.75 mV
MDC IDC STAT BRADY AP VP PERCENT: 0.24 %
MDC IDC STAT BRADY AS VP PERCENT: 0.05 %
MDC IDC STAT BRADY AS VS PERCENT: 4.28 %
MDC IDC STAT BRADY RA PERCENT PACED: 95.67 %

## 2016-09-25 ENCOUNTER — Other Ambulatory Visit: Payer: Self-pay | Admitting: Cardiology

## 2016-10-25 ENCOUNTER — Ambulatory Visit (INDEPENDENT_AMBULATORY_CARE_PROVIDER_SITE_OTHER): Payer: Medicare Other | Admitting: Cardiology

## 2016-10-25 ENCOUNTER — Encounter: Payer: Self-pay | Admitting: Cardiology

## 2016-10-25 ENCOUNTER — Encounter (INDEPENDENT_AMBULATORY_CARE_PROVIDER_SITE_OTHER): Payer: Self-pay

## 2016-10-25 VITALS — BP 163/77 | HR 85 | Ht 62.5 in | Wt 135.6 lb

## 2016-10-25 DIAGNOSIS — I5032 Chronic diastolic (congestive) heart failure: Secondary | ICD-10-CM

## 2016-10-25 DIAGNOSIS — I1310 Hypertensive heart and chronic kidney disease without heart failure, with stage 1 through stage 4 chronic kidney disease, or unspecified chronic kidney disease: Secondary | ICD-10-CM

## 2016-10-25 DIAGNOSIS — Z950 Presence of cardiac pacemaker: Secondary | ICD-10-CM

## 2016-10-25 DIAGNOSIS — N184 Chronic kidney disease, stage 4 (severe): Secondary | ICD-10-CM

## 2016-10-25 DIAGNOSIS — Z7901 Long term (current) use of anticoagulants: Secondary | ICD-10-CM

## 2016-10-25 MED ORDER — CARVEDILOL 6.25 MG PO TABS: 6.2500 mg | ORAL_TABLET | Freq: Two times a day (BID) | ORAL | 3 refills | Status: DC

## 2016-10-25 MED ORDER — AMLODIPINE BESYLATE 5 MG PO TABS: 5.0000 mg | ORAL_TABLET | Freq: Every day | ORAL | 1 refills | Status: DC

## 2016-10-25 NOTE — Progress Notes (Signed)
10/25/2016 [name]   [birth year]  [id number]  Primary Physician [name], MD Primary Cardiologist: Dr Harding/ [name]  HPI:  82 y/o female with a history of PAF. She had been on Amiodarone but had syncope and heart block in June 2016. She also had some retinopathy attributed to Amiodarone and this was stopped.  Her syncope caused a fractured Lt hip. She ultimately underwent MDT pacemaker implant followed by Lt hip surgery 05/01/15. [name] has followed her pacemaker and [name] has been her general cardiologist. She saw [name] in July and complained of fatigue. He had her stop her coreg to see if that helped, his plan was to try Diltiazem if it did. The pt says she could tell no difference in her fatigue.  She says its most noticeable after her morning medications, but gets better later in the morning. She has a log of her B/P at home and it has been running high- 140's/ 80-90. She denies any dyspnea or chest pain.    Current Outpatient Prescriptions  Medication Sig Dispense Refill  . CVS VITAMIN B12 1000 MCG tablet TAKE 1 TABLET BY MOUTH EVERY DAY 90 tablet 3  . cyclobenzaprine (FLEXERIL) 10 MG tablet Take 1 tablet (10 mg total) by mouth at bedtime. 30 tablet 5  . ELIQUIS 2.5 MG TABS tablet TAKE 1 TABLET BY MOUTH TWICE A DAY 60 tablet 11  . furosemide (LASIX) 40 MG tablet Take 1 tablet (40 mg total) by mouth daily as needed for fluid (more than 2-3 pounds overnight and/or 5 pounds in a week).    . hydrALAZINE (APRESOLINE) 100 MG tablet TAKE 1 TABLET (100 MG TOTAL) BY MOUTH 2 (TWO) TIMES DAILY. 60 tablet 11  . levothyroxine (SYNTHROID, LEVOTHROID) 88 MCG tablet TAKE 1 TABLET (88 MCG TOTAL) BY MOUTH DAILY BEFORE BREAKFAST. 30 tablet 0  . LORazepam (ATIVAN) 0.5 MG tablet Take 0.5 mg by mouth every 8 (eight) hours as needed for anxiety or sleep.    . mupirocin cream (BACTROBAN) 2 % APPLY 1 APPLICATION TO SKIN AS NEEDED FOR ITCHY SKIN IRRITATION  2  . nitroGLYCERIN (NITROSTAT)  0.4 MG SL tablet Place 1 tablet (0.4 mg total) under the tongue every 5 (five) minutes x 3 doses as needed for chest pain. 25 tablet 3  . temazepam (RESTORIL) 30 MG capsule Take 30 mg by mouth at bedtime as needed for sleep.    Marland Kitchen ULORIC 40 MG tablet Take 1 tablet by mouth daily.    Marland Kitchen amLODipine (NORVASC) 5 MG tablet Take 1 tablet (5 mg total) by mouth daily. 90 tablet 1  . carvedilol (COREG) 6.25 MG tablet Take 1 tablet (6.25 mg total) by mouth 2 (two) times daily. 180 tablet 3   No current facility-administered medications for this visit.     Allergies  Allergen Reactions  . Penicillins Swelling    REACTION: nausea, swelling  . Xifaxan [Rifaximin] Other (See Comments) and Rash    unknown  . Ambien [Zolpidem Tartrate]     " made her crazy "  . Amoxicillin     REACTION: unspecified  . Ceftin [Cefuroxime Axetil] Diarrhea  . Codeine Phosphate     REACTION: unspecified  . Colchicine     Severe diarrhea  . Lipitor [Atorvastatin] Other (See Comments)    "makes legs jump all night"    Social History   Social History  . Marital status: Widowed    Spouse name: N/A  . Number of children:  2  . Years of education: N/A   Occupational History  . retired    Social History Main Topics  . Smoking status: Never Smoker  . Smokeless tobacco: Never Used     Comment: never used tobacco  . Alcohol use No  . Drug use: No  . Sexual activity: No   Other Topics Concern  . Not on file   Social History Narrative   She is a widowed mother of 2, one child is deceased. Grandmother of one. She appears to be working out routinely at [employer] using [employer]. She is otherwise quite active.   She does not drink alcohol, does not smoke.  She is retired from [employer].    Lives alone in a one story condo.     Review of Systems: General: negative for chills, fever, night sweats or weight changes.  Cardiovascular: negative for chest pain, dyspnea on exertion, edema,  orthopnea, palpitations, paroxysmal nocturnal dyspnea or shortness of breath Dermatological: negative for rash Respiratory: negative for cough or wheezing Urologic: negative for hematuria Abdominal: negative for nausea, vomiting, diarrhea, bright red blood per rectum, melena, or hematemesis Neurologic: negative for visual changes, syncope, or dizziness All other systems reviewed and are otherwise negative except as noted above.    Blood pressure (!) 163/77, pulse 85, height 5' 2.5" (1.588 m), weight 135 lb 9.6 oz (61.5 kg).  General appearance: alert, cooperative and no distress Neck: no carotid bruit and no JVD Lungs: clear to auscultation bilaterally Heart: irregularly irregular rhythm Extremities: extremities normal, atraumatic, no cyanosis or edema Skin: Skin color, texture, turgor normal. No rashes or lesions Neurologic: Grossly normal   ASSESSMENT AND PLAN:   Hypertensive heart/kidney disease w/chronic kidney disease stage IV She sees Dr [name].   Cardiac pacemaker in situ MDT PTVDP May 01 2015. [name] follows  Chronic anticoagulation- Eliquis CHADs VASc = 4  Chronic diastolic heart failure Normal LVF with LVH on cho 2014. No CHF by history or exam   PLAN  I suggested she resume her Coreg. I wonder if her symptoms are secondary to Hydralazine 100 mg in the am. We'll have her return for a B/P check with our pharmacist. If her B/P significantly improves consider decreasing her hydralazine.   Kerin Ransom PA-C 10/25/2016 11:48 AM

## 2016-10-25 NOTE — Patient Instructions (Signed)
Medication Instructions:  START CARVEDILOL 6.25 TWICE DAILY    If you need a refill on your cardiac medications before your next appointment, please call your pharmacy.  Labwork: NONE  Testing/Procedures: NONE  Follow-Up: Your physician recommends that you schedule a follow-up appointment IN 3-4 WEEKS FOR BLOOD PRESSURE CHECK  Your physician wants you to follow-up in: [place] will receive a reminder letter in the mail two months in advance. If you don't receive a letter, please call our office to schedule the follow-up appointment.   HAPPY HOLIDAYS!!    Thank you for choosing [facility]!!

## 2016-10-25 NOTE — Assessment & Plan Note (Signed)
CHADs VASc = 4

## 2016-10-25 NOTE — Assessment & Plan Note (Signed)
She sees Dr [name].

## 2016-10-25 NOTE — Assessment & Plan Note (Signed)
Normal LVF with LVH on cho 2014. No CHF by history or exam

## 2016-10-25 NOTE — Assessment & Plan Note (Signed)
MDT PTVDP May 01 2015. [name] follows

## 2016-11-09 ENCOUNTER — Other Ambulatory Visit: Payer: Self-pay | Admitting: Family Medicine

## 2016-11-28 ENCOUNTER — Ambulatory Visit: Payer: Medicare Other

## 2016-12-05 ENCOUNTER — Ambulatory Visit (INDEPENDENT_AMBULATORY_CARE_PROVIDER_SITE_OTHER): Payer: Medicare Other | Admitting: Pharmacist Clinician (PhC)/ Clinical Pharmacy Specialist

## 2016-12-05 DIAGNOSIS — I110 Hypertensive heart disease with heart failure: Secondary | ICD-10-CM

## 2016-12-05 NOTE — Progress Notes (Signed)
12/06/2016 KENYADA DOSCH 02-May-1924 [id number]   HPI:  [name] is a 83 y.o. female patient of [name], with a PMH below who presents today for hypertension clinic evaluation.   According to the patient, she has had hypertension for about 20 years, mostly well controlled.  She was previously on amiodarone, which caused her to have a syncopal episode.  The resulting fall,in June 2016, caused a fractured left hip  She has been doing well since then and is back at the [facility] three days per week.  Today she reports no problems with SOB or CP, and only mild LEE on her left foot/ankle.    After seeing Lurena Joiner in December, she was restarted on carvedilol bid.  Because of some hesitance on her part, she only took the medication once daily for about 2-3 weeks.  After January 1 she increased the dose to twice daily.  She does note occasional dizziness in the early mornings, usually after taking her medications, thus she separates the morning hydralazine dose by a few hours.    Blood Pressure Goal:  140/90   (with her age and previous fall/hip break, would not aim to go as low as 130/80)  Current Medications:  Amlodipine 5 mg daily  Carvedilol 6.25 mg bid  Hydralazine 100 mg bid  Cardiac Hx:  PAF, CKD (stage IV), hypertension  Family Hx:  Father died from MI at age 13, mother with aneurysm at 24; son has hypertension  Social Hx:  No tobacco, rare alcohol, drinks decaf drinks only  Diet:  Eats out regularly, mostly mom and pop type restaurants; does not add salt  Exercise:  Normally goes to [facility] three times per week, has not gone for past couple of weeks because of cold weather/snow, and "too many people there in January"  Home BP readings:  Prior to taking the carvedilol bid, she had elevated morning readings (510-258 systolic); however since she increased carvedilol to bid her average is 140/70.    Intolerances:   No antihypertensive intolerances  Wt Readings from Last 3  Encounters:  10/25/16 135 lb 9.6 oz (61.5 kg)  07/16/16 136 lb (61.7 kg)  06/28/16 133 lb 8 oz (60.6 kg)   BP Readings from Last 3 Encounters:  10/25/16 (!) 163/77  07/16/16 (!) 173/83  06/28/16 140/82   Pulse Readings from Last 3 Encounters:  10/25/16 85  07/16/16 99  06/28/16 91    Current Outpatient Prescriptions  Medication Sig Dispense Refill  . amLODipine (NORVASC) 5 MG tablet Take 1 tablet (5 mg total) by mouth daily. 90 tablet 1  . carvedilol (COREG) 6.25 MG tablet Take 1 tablet (6.25 mg total) by mouth 2 (two) times daily. 180 tablet 3  . CVS VITAMIN B12 1000 MCG tablet TAKE 1 TABLET BY MOUTH EVERY DAY 90 tablet 3  . cyclobenzaprine (FLEXERIL) 10 MG tablet Take 1 tablet (10 mg total) by mouth at bedtime. 30 tablet 5  . ELIQUIS 2.5 MG TABS tablet TAKE 1 TABLET BY MOUTH TWICE A DAY 60 tablet 11  . furosemide (LASIX) 40 MG tablet Take 1 tablet (40 mg total) by mouth daily as needed for fluid (more than 2-3 pounds overnight and/or 5 pounds in a week).    . hydrALAZINE (APRESOLINE) 100 MG tablet TAKE 1 TABLET (100 MG TOTAL) BY MOUTH 2 (TWO) TIMES DAILY. 60 tablet 11  . levothyroxine (SYNTHROID, LEVOTHROID) 88 MCG tablet TAKE 1 TABLET (88 MCG TOTAL) BY MOUTH DAILY BEFORE BREAKFAST.  30 tablet 1  . LORazepam (ATIVAN) 0.5 MG tablet Take 0.5 mg by mouth every 8 (eight) hours as needed for anxiety or sleep.    . mupirocin cream (BACTROBAN) 2 % APPLY 1 APPLICATION TO SKIN AS NEEDED FOR ITCHY SKIN IRRITATION  2  . nitroGLYCERIN (NITROSTAT) 0.4 MG SL tablet Place 1 tablet (0.4 mg total) under the tongue every 5 (five) minutes x 3 doses as needed for chest pain. 25 tablet 3  . temazepam (RESTORIL) 30 MG capsule Take 30 mg by mouth at bedtime as needed for sleep.    Marland Kitchen ULORIC 40 MG tablet Take 1 tablet by mouth daily.     No current facility-administered medications for this visit.     Allergies  Allergen Reactions  . Penicillins Swelling    REACTION: nausea, swelling  . Xifaxan  [Rifaximin] Other (See Comments) and Rash    unknown  . Ambien [Zolpidem Tartrate]     " made her crazy "  . Amoxicillin     REACTION: unspecified  . Ceftin [Cefuroxime Axetil] Diarrhea  . Codeine Phosphate     REACTION: unspecified  . Colchicine     Severe diarrhea  . Lipitor [Atorvastatin] Other (See Comments)    "makes legs jump all night"    Past Medical History:  Diagnosis Date  . Anemia   . Anxiety   . Arthritis   . Asthma   . Benign positional vertigo   . Cardiac pacemaker in situ 05/01/2015   for syncope & Sx Bradycardia -> Medtronic MRI compatible pacemaker placed Dr. Caryl Comes   . Chronic diarrhea   . Chronic kidney disease, stage IV (severe) ([place])   . Colon polyps   . Diastolic dysfunction, left ventricle 11/03/2013  . Diverticulosis   . Gout   . Hemorrhoids   . Hyperlipidemia   . Hypertensive heart disease    no significant RAS by MRA 2002- (<30% LRAS)   . Hypothyroidism   . IBS (irritable bowel syndrome)   . LBBB (left bundle branch block)- new 11/01/13 11/01/2013  . Paroxysmal atrial fibrillation 11/01/2013   Intermittent through the years and recurrent in December of 2014 treated with amiodarone;; Negative Myoview 10/2013  . Peripheral vascular disease    08-67% LICA, 6-19% RICA by doppler 2009   . Sinus arrest 04/28/2015  . Syncope and collapse 04/28/2015   s/p PPM  . Tubulovillous adenoma 4/07    There were no vitals taken for this visit.  No problem-specific Assessment & Plan notes found for this encounter.   Tommy Medal PharmD CPP [facility]

## 2016-12-05 NOTE — Patient Instructions (Signed)
  Your blood pressure today is 140/72  Check your blood pressure at home several times each week and keep record of the readings.  Take your BP meds as follows:  Carvedilol 6.25 mg twice daily  Hydralazine 100 mg twice daily  Amlodipine 5 mg once daily  Bring all of your meds, your BP cuff and your record of home blood pressures to your next appointment.  Exercise as you're able, try to walk approximately 30 minutes per day.  Keep salt intake to a minimum, especially watch canned and prepared boxed foods.  Eat more fresh fruits and vegetables and fewer canned items.  Avoid eating in fast food restaurants.    HOW TO TAKE YOUR BLOOD PRESSURE: . Rest 5 minutes before taking your blood pressure. .  Don't smoke or drink caffeinated beverages for at least 30 minutes before. . Take your blood pressure before (not after) you eat. . Sit comfortably with your back supported and both feet on the floor (don't cross your legs). . Elevate your arm to heart level on a table or a desk. . Use the proper sized cuff. It should fit smoothly and snugly around your bare upper arm. There should be enough room to slip a fingertip under the cuff. The bottom edge of the cuff should be 1 inch above the crease of the elbow. . Ideally, take 3 measurements at one sitting and record the average.

## 2016-12-06 ENCOUNTER — Encounter: Payer: Self-pay | Admitting: Pharmacist Clinician (PhC)/ Clinical Pharmacy Specialist

## 2016-12-06 NOTE — Assessment & Plan Note (Signed)
Patient is 83 year old lady in good health with BP in the office today of 140/72.  Because of her advancing age and previous fall, I am not going to make any changes to her medication at this time.  I don't think there is any benefit of lowering her another 10 points, but suspect that she would be an increased fall risk should we continue to add medications.  She will continue with monitoring of her home BP readings and can call the office should she see readings trend to 549 or higher systolic.  She was encouraged to get back to the gym and stay active.

## 2016-12-09 ENCOUNTER — Ambulatory Visit (INDEPENDENT_AMBULATORY_CARE_PROVIDER_SITE_OTHER): Payer: Medicare Other | Admitting: *Deleted

## 2016-12-09 ENCOUNTER — Telehealth: Payer: Self-pay | Admitting: Cardiology

## 2016-12-09 DIAGNOSIS — I495 Sick sinus syndrome: Secondary | ICD-10-CM

## 2016-12-09 NOTE — Telephone Encounter (Signed)
Spoke with pt and reminded pt of remote transmission that is due today. Pt verbalized understanding.   

## 2016-12-10 ENCOUNTER — Encounter: Payer: Self-pay | Admitting: Cardiology

## 2016-12-10 LAB — CUP PACEART REMOTE DEVICE CHECK
Battery Remaining Longevity: 95 mo
Battery Voltage: 3.01 V
Brady Statistic AP VS Percent: 96.05 %
Brady Statistic RV Percent Paced: 0.24 %
Date Time Interrogation Session: 20180129204444
Implantable Lead Implant Date: 20160620
Implantable Lead Location: 753859
Implantable Lead Location: 753860
Implantable Lead Model: 5076
Implantable Pulse Generator Implant Date: 20160620
Lead Channel Impedance Value: 456 Ohm
Lead Channel Impedance Value: 475 Ohm
Lead Channel Pacing Threshold Amplitude: 0.625 V
Lead Channel Pacing Threshold Amplitude: 0.75 V
Lead Channel Pacing Threshold Pulse Width: 0.4 ms
Lead Channel Sensing Intrinsic Amplitude: 1.625 mV
Lead Channel Sensing Intrinsic Amplitude: 1.625 mV
Lead Channel Sensing Intrinsic Amplitude: 14.875 mV
Lead Channel Setting Pacing Pulse Width: 0.4 ms
Lead Channel Setting Sensing Sensitivity: 4 mV
MDC IDC LEAD IMPLANT DT: 20160620
MDC IDC MSMT LEADCHNL RA IMPEDANCE VALUE: 323 Ohm
MDC IDC MSMT LEADCHNL RV IMPEDANCE VALUE: 399 Ohm
MDC IDC MSMT LEADCHNL RV PACING THRESHOLD PULSEWIDTH: 0.4 ms
MDC IDC MSMT LEADCHNL RV SENSING INTR AMPL: 14.875 mV
MDC IDC SET LEADCHNL RA PACING AMPLITUDE: 1.5 V
MDC IDC SET LEADCHNL RV PACING AMPLITUDE: 2 V
MDC IDC STAT BRADY AP VP PERCENT: 0.17 %
MDC IDC STAT BRADY AS VP PERCENT: 0.01 %
MDC IDC STAT BRADY AS VS PERCENT: 3.77 %
MDC IDC STAT BRADY RA PERCENT PACED: 95.19 %

## 2016-12-10 NOTE — Progress Notes (Signed)
Remote pacemaker transmission.   

## 2016-12-22 ENCOUNTER — Other Ambulatory Visit: Payer: Self-pay | Admitting: Family Medicine

## 2016-12-24 NOTE — Telephone Encounter (Signed)
Call in #30 with 5 rf 

## 2017-01-11 ENCOUNTER — Encounter (HOSPITAL_BASED_OUTPATIENT_CLINIC_OR_DEPARTMENT_OTHER): Payer: Self-pay | Admitting: Adult Health

## 2017-01-11 ENCOUNTER — Emergency Department (HOSPITAL_BASED_OUTPATIENT_CLINIC_OR_DEPARTMENT_OTHER)
Admission: EM | Admit: 2017-01-11 | Discharge: 2017-01-11 | Disposition: A | Discharge status: Home / Self Care | Payer: Medicare Other | Attending: Physician Assistant | Admitting: Physician Assistant

## 2017-01-11 ENCOUNTER — Emergency Department (HOSPITAL_BASED_OUTPATIENT_CLINIC_OR_DEPARTMENT_OTHER): Payer: Medicare Other

## 2017-01-11 DIAGNOSIS — J45909 Unspecified asthma, uncomplicated: Secondary | ICD-10-CM | POA: Insufficient documentation

## 2017-01-11 DIAGNOSIS — I130 Hypertensive heart and chronic kidney disease with heart failure and stage 1 through stage 4 chronic kidney disease, or unspecified chronic kidney disease: Secondary | ICD-10-CM | POA: Diagnosis not present

## 2017-01-11 DIAGNOSIS — S50312A Abrasion of left elbow, initial encounter: Secondary | ICD-10-CM | POA: Insufficient documentation

## 2017-01-11 DIAGNOSIS — S0081XA Abrasion of other part of head, initial encounter: Secondary | ICD-10-CM | POA: Diagnosis not present

## 2017-01-11 DIAGNOSIS — N184 Chronic kidney disease, stage 4 (severe): Secondary | ICD-10-CM | POA: Insufficient documentation

## 2017-01-11 DIAGNOSIS — R079 Chest pain, unspecified: Secondary | ICD-10-CM

## 2017-01-11 DIAGNOSIS — E039 Hypothyroidism, unspecified: Secondary | ICD-10-CM | POA: Insufficient documentation

## 2017-01-11 DIAGNOSIS — Y939 Activity, unspecified: Secondary | ICD-10-CM | POA: Diagnosis not present

## 2017-01-11 DIAGNOSIS — W1800XA Striking against unspecified object with subsequent fall, initial encounter: Secondary | ICD-10-CM | POA: Diagnosis not present

## 2017-01-11 DIAGNOSIS — Z23 Encounter for immunization: Secondary | ICD-10-CM | POA: Diagnosis not present

## 2017-01-11 DIAGNOSIS — Z950 Presence of cardiac pacemaker: Secondary | ICD-10-CM | POA: Diagnosis not present

## 2017-01-11 DIAGNOSIS — Y999 Unspecified external cause status: Secondary | ICD-10-CM | POA: Diagnosis not present

## 2017-01-11 DIAGNOSIS — S0993XA Unspecified injury of face, initial encounter: Secondary | ICD-10-CM | POA: Diagnosis present

## 2017-01-11 DIAGNOSIS — R0789 Other chest pain: Secondary | ICD-10-CM | POA: Diagnosis not present

## 2017-01-11 DIAGNOSIS — Z79899 Other long term (current) drug therapy: Secondary | ICD-10-CM | POA: Insufficient documentation

## 2017-01-11 DIAGNOSIS — Y929 Unspecified place or not applicable: Secondary | ICD-10-CM | POA: Insufficient documentation

## 2017-01-11 DIAGNOSIS — I5032 Chronic diastolic (congestive) heart failure: Secondary | ICD-10-CM | POA: Insufficient documentation

## 2017-01-11 LAB — BASIC METABOLIC PANEL
Anion gap: 8 (ref 5–15)
BUN: 40 mg/dL — ABNORMAL HIGH (ref 6–20)
CO2: 25 mmol/L (ref 22–32)
Calcium: 9 mg/dL (ref 8.9–10.3)
Chloride: 108 mmol/L (ref 101–111)
Creatinine, Ser: 1.51 mg/dL — ABNORMAL HIGH (ref 0.44–1.00)
GFR calc Af Amer: 34 mL/min — ABNORMAL LOW (ref >60)
GFR calc non Af Amer: 29 mL/min — ABNORMAL LOW (ref >60)
Glucose, Bld: 107 mg/dL — ABNORMAL HIGH (ref 65–99)
Potassium: 4.2 mmol/L (ref 3.5–5.1)
Sodium: 141 mmol/L (ref 135–145)

## 2017-01-11 LAB — CBC
HEMATOCRIT: 31.1 % — AB (ref 36.0–46.0)
Hemoglobin: 10.1 g/dL — ABNORMAL LOW (ref 12.0–15.0)
MCH: 31.1 pg (ref 26.0–34.0)
MCHC: 32.5 g/dL (ref 30.0–36.0)
MCV: 95.7 fL (ref 78.0–100.0)
PLATELETS: 177 10*3/uL (ref 150–400)
RBC: 3.25 MIL/uL — AB (ref 3.87–5.11)
RDW: 14.6 % (ref 11.5–15.5)
WBC: 5 10*3/uL (ref 4.0–10.5)

## 2017-01-11 LAB — TROPONIN I: Troponin I: <0.03 ng/mL (ref <0.03)

## 2017-01-11 MED ORDER — ACETAMINOPHEN 500 MG PO TABS: 725.0000 mg | ORAL_TABLET | Freq: Four times a day (QID) | ORAL | 0 refills | Status: DC | PRN

## 2017-01-11 MED ORDER — ACETAMINOPHEN 325 MG PO TABS
650.0000 mg | ORAL_TABLET | Freq: Once | ORAL | Status: AC
  Administered 2017-01-11: 650 mg via ORAL
  Filled 2017-01-11: qty 2

## 2017-01-11 MED ORDER — TETANUS-DIPHTH-ACELL PERTUSSIS 5-2.5-18.5 LF-MCG/0.5 IM SUSP
0.5000 mL | Freq: Once | INTRAMUSCULAR | Status: AC
  Administered 2017-01-11: 0.5 mL via INTRAMUSCULAR
  Filled 2017-01-11: qty 0.5

## 2017-01-11 NOTE — ED Triage Notes (Signed)
PResents with left sided chest pain -described as sore  And worse with inspiration denies nausea and vomiting. Pain began yesterday and asssociated with abdominal pain and multiple bouts of diarrhea. Pt haas a hx of bradycardia with syncope and permanent pacemaker -medtronic. SHe fell Tuesday and had a broken knee, she does believe she hit her chest when she fell. Has a bruise on her chin-she was seen at her PCP for the fall. Pain is constant.

## 2017-01-11 NOTE — ED Notes (Signed)
Pt discharged to home with family. NAD.  

## 2017-01-11 NOTE — Discharge Instructions (Signed)
Please take medicines to help your pain so you are able to take deep breaths.

## 2017-01-11 NOTE — ED Provider Notes (Signed)
[facility] Provider Note   CSN: [id number] Arrival date & time: 01/11/17  1154     History   Chief Complaint Chief Complaint  Patient presents with  . Chest Pain    HPI TANEIKA CHOI is a 83 y.o. female.  HPI   83 yo with ho bradycardia, a fib, with pacemaker on Eloquis presents 4days after a fall with worsening CP. Patient fell 4 days ago-mechanical and then went to Dr. Lillia Corporal for knee pain. Diagnosed with fractured patella. Developed pleuritic chest pain yesterday.  No CP at rest. Non exertial.  Only with deep breaths, located in lateral chest wall- left. No SOB. Already on Eloquis .  Past Medical History:  Diagnosis Date  . Anemia   . Anxiety   . Arthritis   . Asthma   . Benign positional vertigo   . Cardiac pacemaker in situ 05/01/2015   for syncope & Sx Bradycardia -> Medtronic MRI compatible pacemaker placed Dr. Caryl Comes   . Chronic diarrhea   . Chronic kidney disease, stage IV (severe) ([place])   . Colon polyps   . Diastolic dysfunction, left ventricle 11/03/2013  . Diverticulosis   . Gout   . Hemorrhoids   . Hyperlipidemia   . Hypertensive heart disease    no significant RAS by MRA 2002- (<30% LRAS)   . Hypothyroidism   . IBS (irritable bowel syndrome)   . LBBB (left bundle branch block)- new 11/01/13 11/01/2013  . Paroxysmal atrial fibrillation 11/01/2013   Intermittent through the years and recurrent in December of 2014 treated with amiodarone;; Negative Myoview 10/2013  . Peripheral vascular disease    17-00% LICA, 1-74% RICA by doppler 2009   . Sinus arrest 04/28/2015  . Syncope and collapse 04/28/2015   s/p PPM  . Tubulovillous adenoma 4/07    Patient Active Problem List   Diagnosis Date Noted  . Insomnia, persistent 07/26/2015  . Cardiac pacemaker in situ   . Femoral neck fracture ([place]) 05/01/2015  . Syncope and collapse 04/28/2015  . Right bundle branch block (RBBB) intermittent  04/28/2015  . Closed left hip fracture ([place])   . Fall    . Syncope, cardiogenic   . Abnormal liver function tests 11/10/2014  . Systolic hypertension, isolated; difficult to control 10/04/2014  . CKD (chronic kidney disease), stage IV ([place]) 07/31/2014  . Angina of effort ([place]) 07/30/2014  . Gout 06/17/2014  . Edema of both legs 02/09/2014  . Encounter for long-term (current) use of other medications 12/25/2013  . Hypertensive heart/kidney disease w/chronic kidney disease stage IV ([place]) 12/25/2013  . Chest pain 12/03/2013  . Chronic diastolic heart failure ([place]) 12/03/2013  . H/O tachycardia-bradycardia syndrome   . Chronic anticoagulation- Eliquis 11/17/2013  . LBBB (left bundle branch block)- new 11/01/13 11/01/2013  . Paroxysmal atrial fibrillation 11/01/2013  . Peripheral vascular disease   . Benign positional vertigo   . Irritable bowel syndrome 03/22/2009  . Anxiety   . Reactive airway disease 01/13/2008  . Chronic anemia   . Osteoarthritis   . Hypothyroidism   . Hyperlipidemia   . Hypertensive heart disease     Past Surgical History:  Procedure Laterality Date  . APPENDECTOMY    . CATARACT EXTRACTION    . EP IMPLANTABLE DEVICE N/A 05/01/2015   Procedure: Pacemaker Implant;  Surgeon: Deboraha Sprang, MD;  Location: [facility];  Service: Cardiovascular;  Laterality: N/A;  . HIP ARTHROPLASTY Left 05/01/2015   Procedure: ARTHROPLASTY BIPOLAR HIP (HEMIARTHROPLASTY);  Surgeon:  Rod Can, MD;  Location: [facility];  Service: Orthopedics;  Laterality: Left;  . LOW ANTERIOR BOWEL RESECTION  7/08  . [place] SURGERY  2000  . NM MYOVIEW LTD  11/02/2013   Negative for ischemia or infarction. EF 60%.  . OOPHORECTOMY  1962   right  . squamous cell skin cancer removed    . TRANSTHORACIC ECHOCARDIOGRAM  11/03/2013   Echocardiogram: EF 55-60%, mild LVH, elevated bili pressures. Mild aortic valve calcification.    OB History    No data available       Home Medications    Prior to Admission medications   Medication Sig  Start Date End Date Taking? Authorizing Provider  amLODipine (NORVASC) 5 MG tablet Take 1 tablet (5 mg total) by mouth daily. 10/25/16   Erlene Quan, PA-C  carvedilol (COREG) 6.25 MG tablet Take 1 tablet (6.25 mg total) by mouth 2 (two) times daily. 10/25/16   Erlene Quan, PA-C  [facility] VITAMIN B12 1000 MCG tablet TAKE 1 TABLET BY MOUTH EVERY DAY 03/21/16   [name], MD  cyclobenzaprine (FLEXERIL) 10 MG tablet Take 1 tablet (10 mg total) by mouth at bedtime. 07/16/16   [name], MD  ELIQUIS 2.5 MG TABS tablet TAKE 1 TABLET BY MOUTH TWICE A DAY 07/26/16   [name], MD  furosemide (LASIX) 40 MG tablet Take 1 tablet (40 mg total) by mouth daily as needed for fluid (more than 2-3 pounds overnight and/or 5 pounds in a week). 05/03/15   Barton Dubois, MD  hydrALAZINE (APRESOLINE) 100 MG tablet TAKE 1 TABLET (100 MG TOTAL) BY MOUTH 2 (TWO) TIMES DAILY. 02/05/16   [name], MD  levothyroxine (SYNTHROID, LEVOTHROID) 88 MCG tablet TAKE 1 TABLET (88 MCG TOTAL) BY MOUTH DAILY BEFORE BREAKFAST. 11/12/16   [name], MD  LORazepam (ATIVAN) 0.5 MG tablet Take 0.5 mg by mouth every 8 (eight) hours as needed for anxiety or sleep.    Historical Provider, MD  mupirocin cream (BACTROBAN) 2 % APPLY 1 APPLICATION TO SKIN AS NEEDED FOR ITCHY SKIN IRRITATION 12/06/15   Historical Provider, MD  nitroGLYCERIN (NITROSTAT) 0.4 MG SL tablet Place 1 tablet (0.4 mg total) under the tongue every 5 (five) minutes x 3 doses as needed for chest pain. 02/27/16   [name], MD  temazepam (RESTORIL) 30 MG capsule TAKE ONE CAPSULE BY MOUTH EVERY DAY AT BEDTIME AS NEEDED 12/24/16   [name], MD  ULORIC 40 MG tablet Take 1 tablet by mouth daily. 03/12/16   Historical Provider, MD    Family History Family History  Problem Relation Age of Onset  . Cancer Mother 36    unknown  . Heart attack Father   . Heart disease Brother   . Stroke Maternal Grandmother   . Lung cancer Maternal Grandfather   . Stroke  Paternal Grandmother   . Colon cancer Daughter     Social History Social History  Substance Use Topics  . Smoking status: Never Smoker  . Smokeless tobacco: Never Used     Comment: never used tobacco  . Alcohol use No     Allergies   Penicillins; Xifaxan [rifaximin]; Ambien [zolpidem tartrate]; Amoxicillin; Ceftin [cefuroxime axetil]; Codeine phosphate; Colchicine; and Lipitor [atorvastatin]   Review of Systems Review of Systems  Constitutional: Positive for activity change. Negative for fatigue.  HENT: Negative for congestion.   Respiratory: Negative for cough and chest tightness.   Cardiovascular: Positive for chest pain.  Negative for leg swelling.  Gastrointestinal: Negative for abdominal distention.  Genitourinary: Negative for dysuria.  Musculoskeletal: Positive for joint swelling.  Skin: Negative for rash.  Psychiatric/Behavioral: Negative for agitation.  All other systems reviewed and are negative.    Physical Exam Updated Vital Signs BP 164/78 (BP Location: Left Arm)   Pulse 82   Temp 97.9 F (36.6 C) (Oral)   Resp 14   Wt 135 lb (61.2 kg)   SpO2 94%   BMI 24.30 kg/m   Physical Exam  Constitutional: She is oriented to person, place, and time. She appears well-developed and well-nourished.  HENT:  Head: Normocephalic.  Abrasion to chin   Eyes: Right eye exhibits no discharge.  Cardiovascular:  No murmur heard. Irregularly irregular  Pulmonary/Chest: Effort normal and breath sounds normal. She has no wheezes. She has no rales.  Tenderness to palpation over left lateral ribs 4-8  Abdominal: Soft. She exhibits no distension. There is no tenderness.  Musculoskeletal:  R knee swollen, in abndage.   Neurological: She is oriented to person, place, and time.  Skin: Skin is warm and dry. She is not diaphoretic.  Abrasion to L elbow  Psychiatric: She has a normal mood and affect.  Nursing note and vitals reviewed.    ED Treatments / Results  Labs (all  labs ordered are listed, but only abnormal results are displayed) Labs Reviewed  BASIC METABOLIC PANEL - Abnormal; Notable for the following:       Result Value   Glucose, Bld 107 (*)    BUN 40 (*)    Creatinine, Ser 1.51 (*)    GFR calc non Af Amer 29 (*)    GFR calc Af Amer 34 (*)    All other components within normal limits  CBC - Abnormal; Notable for the following:    RBC 3.25 (*)    Hemoglobin 10.1 (*)    HCT 31.1 (*)    All other components within normal limits  TROPONIN I    EKG  EKG Interpretation None       Radiology Dg Chest 2 View  Result Date: 01/11/2017 CLINICAL DATA:  83 year-old female c/o left sided chest soreness since fall on Tuesday. hx of bradycardia, permanent pacemaker EXAM: CHEST  2 VIEW COMPARISON:  05/02/2015 FINDINGS: Left-sided transvenous pacemaker leads overlie the right atrium and right ventricle. The heart is mildly enlarged. The lungs are mildly hyperinflated. There are no focal consolidations or pleural effusions. No pulmonary edema. No pneumothorax. No acute displaced fractures. Mild degenerative changes in the thoracic spine. IMPRESSION: 1. Cardiomegaly. 2.  No evidence for acute  abnormality. Electronically Signed   By: Nolon Nations M.D.   On: 01/11/2017 14:24    Procedures Procedures (including critical care time)  Medications Ordered in ED Medications  Tdap (BOOSTRIX) injection 0.5 mL (0.5 mLs Intramuscular Given 01/11/17 1256)  acetaminophen (TYLENOL) tablet 650 mg (650 mg Oral Given 01/11/17 1322)     Initial Impression / Assessment and Plan / ED Course  I have reviewed the triage vital signs and the nursing notes.  Pertinent labs & imaging results that were available during my care of the patient were reviewed by me and considered in my medical decision making (see chart for details).     Pt here after mechanical fall 3 days ago. Pt was diagnosed with patellar fracture by [facility]. Yesterday she started with pleuritic CP.  On  physical exam she has point tenderness on left lateral rib cage. Either bruisng or  rib fractur.  Will check xray.  Considered PE (from decreased motiltiy) but less liekly given she is nto tachycardic, and has point chest wall tenderness with no SOB.  Will treat like rib fracture. Patient does not like narcotics and has had several prescriptions filled but will not take them. We will encourage her to take Tylenol around the clock.  Strict return precautions for early pneumonia awareness given.  Final Clinical Impressions(s) / ED Diagnoses   Final diagnoses:  None    New Prescriptions New Prescriptions   No medications on file     Neylan Koroma Julio Alm, MD 01/11/17 1450

## 2017-01-27 ENCOUNTER — Telehealth: Payer: Self-pay | Admitting: Cardiology

## 2017-01-27 MED ORDER — FUROSEMIDE 40 MG PO TABS: 40.0000 mg | ORAL_TABLET | Freq: Every day | ORAL | 3 refills | Status: DC | PRN

## 2017-01-27 NOTE — Telephone Encounter (Signed)
SCRIPT  FOR  FUROSEMIDE  SENT  TO  PHARMACY AS  REQUESTED . PT  AWARE./CY

## 2017-01-27 NOTE — Telephone Encounter (Signed)
New Message  Pt c/o medication issue:  1. Name of Medication: Pt does not know but it's fluid pill  2. How are you currently taking this medication (dosage and times per day)? Pt does not know but it's a fluid pill  3. Are you having a reaction (difficulty breathing--STAT)? N/A  4. What is your medication issue? Pt voiced needing a fluid pill and would like for someone to contact her in regards to getting a prescription for it and pt voiced she threw bottle away a long time ago.

## 2017-02-03 ENCOUNTER — Other Ambulatory Visit: Payer: Self-pay | Admitting: Cardiology

## 2017-02-10 ENCOUNTER — Telehealth: Payer: Self-pay

## 2017-02-10 NOTE — Telephone Encounter (Signed)
Patient called with c/o increased sinus congestion with headache, pnd and nasal drainage. She has not taken any OTC medications, as she was not sure what she could take that would not interfere with her other medications. Advised pt to take Coricidin HBP and saline nasal spray. She will try this and let us know if not improving. Nothing further needed at this time.

## 2017-02-13 ENCOUNTER — Other Ambulatory Visit: Payer: Self-pay | Admitting: Family Medicine

## 2017-02-15 ENCOUNTER — Other Ambulatory Visit: Payer: Self-pay | Admitting: Family Medicine

## 2017-02-20 ENCOUNTER — Telehealth: Payer: Self-pay | Admitting: Cardiology

## 2017-02-20 NOTE — Telephone Encounter (Signed)
New message   Pt c/o Shortness Of Breath: STAT if SOB developed within the last 24 hours or pt is noticeably SOB on the phone  1. Are you currently SOB (can you hear that pt is SOB on the phone)? Yes, pt said she just opened blinds and it made it worse  2. How long have you been experiencing SOB? Several weeks  3. Are you SOB when sitting or when up moving around? A little when she's sitting but mostly when she is up moving around  4. Are you currently experiencing any other symptoms? Pt states she feels tired.   Pt has appt tomorrow with Dr. Ellyn Hack at 10:40am.

## 2017-02-20 NOTE — Telephone Encounter (Signed)
Pt of Dr. Voncille Lo in today, was scheduled for appt tomorrow. c/o SOB x3-4 weeks  Spoke w patient. States intermittent SOB usually associated w activity, or feeling like she can only take shallow breaths when lying in bed. She identifies no symptoms of swelling, wt gain. She hasA Fib hx but notes no irregular HR, palpitations. HR in 80s when checked. BP she notes is WNR after morning meds. She identifies seasonal allergies as a possible culprit/contributor to her SOB. She's taking coricidin. Advised she may want to consider low dose of claritin or zyrtec, she'll discuss w Dr. Ellyn Hack at tomorrow's appt. Aware to f/u as scheduled, will route to provider for [facility]

## 2017-02-21 ENCOUNTER — Encounter: Payer: Self-pay | Admitting: Cardiology

## 2017-02-21 ENCOUNTER — Ambulatory Visit (INDEPENDENT_AMBULATORY_CARE_PROVIDER_SITE_OTHER): Payer: Medicare Other | Admitting: Cardiology

## 2017-02-21 VITALS — BP 129/68 | HR 80 | Ht 63.0 in | Wt 138.8 lb

## 2017-02-21 DIAGNOSIS — I10 Essential (primary) hypertension: Secondary | ICD-10-CM | POA: Diagnosis not present

## 2017-02-21 DIAGNOSIS — R600 Localized edema: Secondary | ICD-10-CM | POA: Diagnosis not present

## 2017-02-21 DIAGNOSIS — R0602 Shortness of breath: Secondary | ICD-10-CM

## 2017-02-21 DIAGNOSIS — I119 Hypertensive heart disease without heart failure: Secondary | ICD-10-CM | POA: Diagnosis not present

## 2017-02-21 DIAGNOSIS — R55 Syncope and collapse: Secondary | ICD-10-CM

## 2017-02-21 DIAGNOSIS — R0609 Other forms of dyspnea: Secondary | ICD-10-CM | POA: Insufficient documentation

## 2017-02-21 DIAGNOSIS — J4520 Mild intermittent asthma, uncomplicated: Secondary | ICD-10-CM

## 2017-02-21 DIAGNOSIS — I5032 Chronic diastolic (congestive) heart failure: Secondary | ICD-10-CM

## 2017-02-21 DIAGNOSIS — I480 Paroxysmal atrial fibrillation: Secondary | ICD-10-CM | POA: Diagnosis not present

## 2017-02-21 NOTE — Progress Notes (Signed)
PCP: [name], MD  Clinic Note: Chief Complaint  Patient presents with  . Follow-up    afib    HPI: [name] is a 83 y.o. female with a PMH below who presents today for 10 month f/u & ER f/u. She has a history of chronic atrial fibrillation status post pacemaker placement for syncope with complete heart block in June 2016. At that time she had a syncopal event and fell fracturing her hip.  [name] was last seen on June 2017. She was doing relatively well.  She then was seen by Dr. Caryl Comes in July.  Recent Hospitalizations: ER visit on March 3 following a mechanical fall. He fractured her patella and had some pleuritic chest pains.  Studies Reviewed: none  Interval History:  [name] presents today a little bit worse for wear having had her recent fall. She bruised the left side of her chest is been noting significant difficulties with breathing since then. In addition to injuring her right knee where she fractured her patella, she bruised her chest. Thankfully there was no ligament damage done to the knee. She is having a hard time walking now with leg in a brace. She's been noticing some congestion and coughing of late, but is mostly been noting  some intermittent episodes of shortness of breath with exertion. Having a hard time taking deep breaths. No swelling or weight gain. No sensation of irregular heartbeats. Heart rates were in the 80s. She does have seasonal allergies.  She really doesn't describe any PND or orthopnea. No swelling in her legs except for around her right knee. She just had a skin cancer removed from left leg.  She has not had true anginal type chest pain but has been having left-sided chest wall pain from her fall. She did not have any syncope or near sleep time her fall times mechanical fall or she tripped. She may have been a little orthostatic. No syncope/near syncope. No TIA/amaurosis fugax symptoms. No melena, hematochezia, hematuria, or  epstaxis. No claudication.  ROS: A comprehensive was performed. Review of Systems  Constitutional: Negative for malaise/fatigue (Just not quite back to herself after that fall).  HENT: Positive for congestion.   Respiratory: Positive for cough and shortness of breath. Negative for wheezing.   Cardiovascular: Positive for chest pain (chest wall pain - s/p fall with bruised ribs).  Gastrointestinal: Negative for blood in stool and melena.  Genitourinary: Negative for hematuria.  Musculoskeletal: Positive for joint pain (R knee from fractured patella).  Psychiatric/Behavioral: Negative for depression and memory loss. The patient does not have insomnia.   All other systems reviewed and are negative.  She asked about stopping Eliquis for back injection.  Past Medical History:  Diagnosis Date  . Anemia   . Anxiety   . Arthritis   . Asthma   . Benign positional vertigo   . Cardiac pacemaker in situ 05/01/2015   for syncope & Sx Bradycardia -> Medtronic MRI compatible pacemaker placed Dr. Caryl Comes   . Chronic diarrhea   . Chronic kidney disease, stage IV (severe) ([place])   . Colon polyps   . Diastolic dysfunction, left ventricle 11/03/2013  . Diverticulosis   . Gout   . Hemorrhoids   . Hyperlipidemia   . Hypertensive heart disease    no significant RAS by MRA 2002- (<30% LRAS)   . Hypothyroidism   . IBS (irritable bowel syndrome)   . LBBB (left bundle branch block)- new 11/01/13 11/01/2013  . Paroxysmal  atrial fibrillation 11/01/2013   Intermittent through the years and recurrent in December of 2014 treated with amiodarone;; Negative Myoview 10/2013  . Peripheral vascular disease    50-09% LICA, 3-81% RICA by doppler 2009   . Sinus arrest 04/28/2015  . Syncope and collapse 04/28/2015   s/p PPM  . Tubulovillous adenoma 4/07    Past Surgical History:  Procedure Laterality Date  . APPENDECTOMY    . CATARACT EXTRACTION    . EP IMPLANTABLE DEVICE N/A 05/01/2015   Procedure: Pacemaker  Implant;  Surgeon: Deboraha Sprang, MD;  Location: [facility];  Service: Cardiovascular;  Laterality: N/A;  . HIP ARTHROPLASTY Left 05/01/2015   Procedure: ARTHROPLASTY BIPOLAR HIP (HEMIARTHROPLASTY);  Surgeon: Rod Can, MD;  Location: [facility];  Service: Orthopedics;  Laterality: Left;  . LOW ANTERIOR BOWEL RESECTION  7/08  . [place] SURGERY  2000  . NM MYOVIEW LTD  11/02/2013   Negative for ischemia or infarction. EF 60%.  . OOPHORECTOMY  1962   right  . squamous cell skin cancer removed    . TRANSTHORACIC ECHOCARDIOGRAM  11/03/2013   Echocardiogram: EF 55-60%, mild LVH, elevated bili pressures. Mild aortic valve calcification.    Current Meds  Medication Sig  . acetaminophen (TYLENOL) 500 MG tablet Take 1.5 tablets (750 mg total) by mouth every 6 (six) hours as needed.  Marland Kitchen amLODipine (NORVASC) 5 MG tablet Take 1 tablet (5 mg total) by mouth daily.  . carvedilol (COREG) 6.25 MG tablet Take 1 tablet (6.25 mg total) by mouth 2 (two) times daily.  . CVS VITAMIN B12 1000 MCG tablet TAKE 1 TABLET BY MOUTH EVERY DAY  . ELIQUIS 2.5 MG TABS tablet TAKE 1 TABLET BY MOUTH TWICE A DAY  . furosemide (LASIX) 40 MG tablet Take 1 tablet (40 mg total) by mouth daily as needed for fluid (more than 2-3 pounds overnight and/or 5 pounds in a week).  . hydrALAZINE (APRESOLINE) 100 MG tablet TAKE 1 TABLET BY MOUTH TWICE A DAY  . levothyroxine (SYNTHROID, LEVOTHROID) 88 MCG tablet TAKE 1 TABLET (88 MCG TOTAL) BY MOUTH DAILY BEFORE BREAKFAST.  Marland Kitchen LORazepam (ATIVAN) 0.5 MG tablet Take 0.5 mg by mouth every 8 (eight) hours as needed for anxiety or sleep.  . nitroGLYCERIN (NITROSTAT) 0.4 MG SL tablet Place 1 tablet (0.4 mg total) under the tongue every 5 (five) minutes x 3 doses as needed for chest pain.  Marland Kitchen temazepam (RESTORIL) 30 MG capsule TAKE ONE CAPSULE BY MOUTH EVERY DAY AT BEDTIME AS NEEDED  . ULORIC 40 MG tablet Take 1 tablet by mouth daily.    Allergies  Allergen Reactions  . Penicillins  Swelling    REACTION: nausea, swelling  . Xifaxan [Rifaximin] Other (See Comments) and Rash    unknown  . Ambien [Zolpidem Tartrate]     " made her crazy "  . Amoxicillin     REACTION: unspecified  . Ceftin [Cefuroxime Axetil] Diarrhea  . Codeine Phosphate     REACTION: unspecified  . Colchicine     Severe diarrhea  . Lipitor [Atorvastatin] Other (See Comments)    "makes legs jump all night"    Social History   Social History  . Marital status: Widowed    Spouse name: N/A  . Number of children: 2  . Years of education: N/A   Occupational History  . retired    Social History Main Topics  . Smoking status: Never Smoker  . Smokeless tobacco: Never Used  Comment: never used tobacco  . Alcohol use No  . Drug use: No  . Sexual activity: No   Other Topics Concern  . None   Social History Narrative   She is a widowed mother of 2, one child is deceased. Grandmother of one. She appears to be working out routinely at [employer] using [employer]. She is otherwise quite active.   She does not drink alcohol, does not smoke.  She is retired from [employer].    Lives alone in a one story condo.    family history includes Cancer (age of onset: 5) in her mother; Colon cancer in her daughter; Heart attack in her father; Heart disease in her brother; Lung cancer in her maternal grandfather; Stroke in her maternal grandmother and paternal grandmother.  Wt Readings from Last 3 Encounters:  02/21/17 138 lb 12.8 oz (63 kg)  01/11/17 135 lb (61.2 kg)  12/06/16 135 lb (61.2 kg)    PHYSICAL EXAM BP 129/68 (BP Location: Right Arm)   Pulse 80   Ht 5\' 3"  (1.6 m)   Wt 138 lb 12.8 oz (63 kg)   BMI 24.59 kg/m  General appearance: Very pleasant elderly woman. She is thin, but not cachectic. Well-groomed. No acute distress. Pleasant mood and affect. Neck: no adenopathy, no carotid bruit and no JVD Lungs: clear to auscultation bilaterally - with the exception of  some mild expiratory wheezing, normal percussion bilaterally and non-labored -- she does note left chest wall pain with deep inspiration Heart: regular rate and rhythm, S1 normal with split S2, 1/6 SEM @ RUSB with no click, rub or gallop; non-displaced PMI Abdomen: soft, non-tender; bowel sounds normal; no masses,  no organomegaly;  Extremities: extremities normal, atraumatic, no cyanosis, or edema. R knee in ~immobilizer brace Pulses: 2+ and symmetric;  Skin: mobility and turgor normal, no evidence of bleeding or bruising and no lesions noted. Neurologic: Mental status: Alert, oriented, thought content appropriate   Adult ECG Report  Rate: 80 ;  Rhythm: V &A-V paced rhythm.  Unable to determine underlying atrial rhythm. ;   Narrative Interpretation: stable EKG   Other studies Reviewed: Additional studies/ records that were reviewed today include:  Recent Labs:   Lab Results  Component Value Date   CREATININE 1.51 (H) 01/11/2017   BUN 40 (H) 01/11/2017   NA 141 01/11/2017   K 4.2 01/11/2017   CL 108 01/11/2017   CO2 25 01/11/2017    ASSESSMENT / PLAN: Problem List Items Addressed This Visit    Chronic diastolic heart failure (HCC) - Primary (Chronic)   Relevant Orders   EKG 12-Lead   ECHOCARDIOGRAM COMPLETE   Edema of both legs    Pretty stable. She only uses one or 2 doses of Lasix in a month.      Hypertensive heart disease (Chronic)    She does have some hypertensive heart disease with A. fib, but is otherwise in good health. We are trying not to be too aggressive with her blood pressure control and at present is well-controlled. I think allowing for readings between 120-1 50 mmHg are probably fine. When she is able to go to the gym, her blood pressure is doing much better. Unfortunately now after her knee injury she is sort of out of that for a while. With her dyspnea symptoms, I will make sure that she is not having any worsening cardiac function and would like to exclude  a cardiac contusion with her recent  fall. Plan: Check 2-D echocardiogram.      Relevant Orders   ECHOCARDIOGRAM COMPLETE   Paroxysmal atrial fibrillation: CHA2DS2-VASc Score 5, on Eliquis (Chronic)    On Beta Blocker for rate control - has PPM for back-up. No bleeding on Eliquis - OK to stop pre-procedure (2-3 days pre back injection) 1-2 d pre-other procedure.      Reactive airway disease (Chronic)   Shortness of breath    This is probably related to her still having some splinting from her recent injury, but with it being left-sided chest wall contusion, I want to make sure that she did not have any cardiac damage. She does not have very much girth in her chest wall and I can imagine that her rib cage does not provide as much protection as it once did. Plan: Check 2-D echocardiogram to exclude any abnormal cardiac findings to suggest cardiac injury/contusion.      Relevant Orders   EKG 12-Lead   ECHOCARDIOGRAM COMPLETE   Syncope, cardiogenic (Chronic)    No further episodes since PPM. Her fall was mechanical and not related to syncope.       Systolic hypertension, isolated; difficult to control (Chronic)    Blood pressure was good today on current regimen. Carvedilol, amlodipine and hydralazine      Relevant Orders   EKG 12-Lead      Current medicines are reviewed at length with the patient today. (+/- concerns) SOB - no medication ?s  The following changes have been made:   Patient Instructions  SCHEDULE Barnett has requested that you have an echocardiogram. Echocardiography is a painless test that uses sound waves to create images of your heart. It provides your doctor with information about the size and shape of your heart and how well your heart's chambers and valves are working. This procedure takes approximately one hour. There are no restrictions for this procedure.   Stop ELIQUIS 2 DAYS PRIOR TO INJECTION FOR DR RAMOS ON  03/05/17. RESTART WHEN DR RAMOS GIVES OKAY.  NO OTHER CHANGES    Your physician recommends that you schedule a follow-up appointment on April 16 2017.    Studies Ordered:   Orders Placed This Encounter  Procedures  . EKG 12-Lead  . ECHOCARDIOGRAM COMPLETE      Glenetta Hew, M.D., M.S. Interventional Cardiologist   Pager # [phone] Phone # [phone] [address] [place], [place] [address]

## 2017-02-21 NOTE — Patient Instructions (Addendum)
[name] Rainier has requested that you have an echocardiogram. Echocardiography is a painless test that uses sound waves to create images of your heart. It provides your doctor with information about the size and shape of your heart and how well your heart's chambers and valves are working. This procedure takes approximately one hour. There are no restrictions for this procedure.   Stop [name] 2 DAYS PRIOR TO INJECTION FOR DR RAMOS ON 03/05/17. RESTART WHEN DR RAMOS GIVES OKAY.  NO OTHER CHANGES    Your physician recommends that you [name] a follow-up appointment on April 16 2017.

## 2017-02-23 NOTE — Assessment & Plan Note (Signed)
She does have some hypertensive heart disease with A. fib, but is otherwise in good health. We are trying not to be too aggressive with her blood pressure control and at present is well-controlled. I think allowing for readings between 120-1 50 mmHg are probably fine. When she is able to go to the gym, her blood pressure is doing much better. Unfortunately now after her knee injury she is sort of out of that for a while. With her dyspnea symptoms, I will make sure that she is not having any worsening cardiac function and would like to exclude a cardiac contusion with her recent fall. Plan: Check 2-D echocardiogram.

## 2017-02-23 NOTE — Assessment & Plan Note (Signed)
This is probably related to her still having some splinting from her recent injury, but with it being left-sided chest wall contusion, I want to make sure that she did not have any cardiac damage. She does not have very much girth in her chest wall and I can imagine that her rib cage does not provide as much protection as it once did. Plan: Check 2-D echocardiogram to exclude any abnormal cardiac findings to suggest cardiac injury/contusion.

## 2017-02-23 NOTE — Assessment & Plan Note (Signed)
Blood pressure was good today on current regimen. Carvedilol, amlodipine and hydralazine

## 2017-02-23 NOTE — Assessment & Plan Note (Signed)
No further episodes. Her fall was mechanical and not related to syncope.

## 2017-02-23 NOTE — Assessment & Plan Note (Signed)
No further episodes since PPM. Her fall was mechanical and not related to syncope.

## 2017-02-23 NOTE — Assessment & Plan Note (Signed)
Pretty stable. She only uses one or 2 doses of Lasix in a month.

## 2017-02-23 NOTE — Assessment & Plan Note (Signed)
On Beta Blocker for rate control - has PPM for back-up. No bleeding on Eliquis - OK to stop pre-procedure (2-3 days pre back injection) 1-2 d pre-other procedure.

## 2017-02-25 ENCOUNTER — Other Ambulatory Visit: Payer: Self-pay | Admitting: Family Medicine

## 2017-02-25 ENCOUNTER — Telehealth: Payer: Self-pay | Admitting: Cardiology

## 2017-02-25 NOTE — Telephone Encounter (Signed)
Returned the phone call to the patient. She stated that she does not feel any worse than she has for the past several weeks from her fall. She did see Dr. Ellyn Hack on 4/13 and he ordered an ECHO for 4/26. She stated that she was calling to see if there was an earlier date. Per scheduling there is not but she has been put on the wait list. She was informed to call back or go to the ED if she developed worsening shortness of breath. She verbalized her understanding.

## 2017-02-25 NOTE — Telephone Encounter (Signed)
New Message    Pt c/o Shortness Of Breath: STAT if SOB developed within the last 24 hours or pt is noticeably SOB on the phone  1. Are you currently SOB (can you hear that pt is SOB on the phone)? No  2. How long have you been experiencing SOB? Over a week  3. Are you SOB when sitting or when up moving around? Moving around  4. Are you currently experiencing any other symptoms? Some chest pressure

## 2017-02-26 NOTE — Telephone Encounter (Signed)
Call in #[phone] with 5 rf 

## 2017-03-02 ENCOUNTER — Emergency Department (HOSPITAL_BASED_OUTPATIENT_CLINIC_OR_DEPARTMENT_OTHER): Payer: Medicare Other

## 2017-03-02 ENCOUNTER — Emergency Department (HOSPITAL_BASED_OUTPATIENT_CLINIC_OR_DEPARTMENT_OTHER)
Admission: EM | Admit: 2017-03-02 | Discharge: 2017-03-02 | Disposition: A | Discharge status: Home / Self Care | Payer: Medicare Other | Attending: Emergency Medicine | Admitting: Emergency Medicine

## 2017-03-02 ENCOUNTER — Encounter (HOSPITAL_BASED_OUTPATIENT_CLINIC_OR_DEPARTMENT_OTHER): Payer: Self-pay

## 2017-03-02 DIAGNOSIS — J45909 Unspecified asthma, uncomplicated: Secondary | ICD-10-CM | POA: Diagnosis not present

## 2017-03-02 DIAGNOSIS — R0602 Shortness of breath: Secondary | ICD-10-CM | POA: Insufficient documentation

## 2017-03-02 DIAGNOSIS — Z79899 Other long term (current) drug therapy: Secondary | ICD-10-CM | POA: Insufficient documentation

## 2017-03-02 DIAGNOSIS — I130 Hypertensive heart and chronic kidney disease with heart failure and stage 1 through stage 4 chronic kidney disease, or unspecified chronic kidney disease: Secondary | ICD-10-CM | POA: Insufficient documentation

## 2017-03-02 DIAGNOSIS — I5032 Chronic diastolic (congestive) heart failure: Secondary | ICD-10-CM | POA: Insufficient documentation

## 2017-03-02 DIAGNOSIS — E039 Hypothyroidism, unspecified: Secondary | ICD-10-CM | POA: Insufficient documentation

## 2017-03-02 DIAGNOSIS — N184 Chronic kidney disease, stage 4 (severe): Secondary | ICD-10-CM | POA: Insufficient documentation

## 2017-03-02 LAB — BASIC METABOLIC PANEL
Anion gap: 7 (ref 5–15)
BUN: 46 mg/dL — AB (ref 6–20)
CALCIUM: 8.9 mg/dL (ref 8.9–10.3)
CHLORIDE: 106 mmol/L (ref 101–111)
CO2: 25 mmol/L (ref 22–32)
CREATININE: 1.68 mg/dL — AB (ref 0.44–1.00)
GFR calc non Af Amer: 26 mL/min — ABNORMAL LOW (ref >60)
GFR, EST AFRICAN AMERICAN: 30 mL/min — AB (ref >60)
GLUCOSE: 100 mg/dL — AB (ref 65–99)
Potassium: 4.5 mmol/L (ref 3.5–5.1)
Sodium: 138 mmol/L (ref 135–145)

## 2017-03-02 LAB — CBC WITH DIFFERENTIAL/PLATELET
BASOS PCT: 1 %
Basophils Absolute: 0 10*3/uL (ref 0.0–0.1)
EOS PCT: 1 %
Eosinophils Absolute: 0 10*3/uL (ref 0.0–0.7)
HCT: 29.7 % — ABNORMAL LOW (ref 36.0–46.0)
Hemoglobin: 9.5 g/dL — ABNORMAL LOW (ref 12.0–15.0)
LYMPHS ABS: 0.9 10*3/uL (ref 0.7–4.0)
Lymphocytes Relative: 27 %
MCH: 30.2 pg (ref 26.0–34.0)
MCHC: 32 g/dL (ref 30.0–36.0)
MCV: 94.3 fL (ref 78.0–100.0)
MONO ABS: 0.4 10*3/uL (ref 0.1–1.0)
MONOS PCT: 14 %
Neutro Abs: 1.8 10*3/uL (ref 1.7–7.7)
Neutrophils Relative %: 57 %
Platelets: 167 10*3/uL (ref 150–400)
RBC: 3.15 MIL/uL — ABNORMAL LOW (ref 3.87–5.11)
RDW: 13.8 % (ref 11.5–15.5)
WBC: 3.2 10*3/uL — ABNORMAL LOW (ref 4.0–10.5)

## 2017-03-02 LAB — BRAIN NATRIURETIC PEPTIDE: B Natriuretic Peptide: 630.9 pg/mL — ABNORMAL HIGH (ref 0.0–100.0)

## 2017-03-02 LAB — TROPONIN I: Troponin I: <0.03 ng/mL (ref <0.03)

## 2017-03-02 NOTE — Discharge Instructions (Signed)
You were seen in the ED today with difficulty breathing. Your chest x-ray was normal and your lab work and EKG were also unremarkable. We would like for you to follow up with your PCP and Cardiologist tomorrow by phone to set up an appointment later this week.   Return to the ED with any chest pain/pressure, worsening difficulty breathing, fever, chills, or other concerning symptoms.

## 2017-03-02 NOTE — ED Triage Notes (Signed)
Pt reports fall 5-6 weeks ago with fx patella. Reports hitting left chest when fell, was seen for same and advised possible rib fx. Pt talking in full sentences, with no difficulty in triage.

## 2017-03-02 NOTE — ED Notes (Signed)
Pt on automatic VS and cardiac monitor 

## 2017-03-02 NOTE — ED Provider Notes (Signed)
Emergency Department Provider Note  By signing my name below, I, Avnee Patel, attest that this documentation has been prepared under the direction and in the presence of Margette Fast, MD  Electronically Signed: Delton Pra[place], ED Scribe. 03/02/17. 3:30 PM.   I have reviewed the triage vital signs and the nursing notes.   HISTORY  Chief Complaint Shortness of Breath   HPI [name] is a 83 y.o. female complaining of acute onset, persistent SOB beginning 3 weeks ago. She also reports intermittent chest pain with her episodes of SOB which she describes as a pressure sensation when experiencing SOB but not at rest. Her SOB only occurs during exertion. Pt states she visited the ED on 01/11/2017 for pleuritic chest pain s/p a mechanical fall which occurred 3 days prior to that visit. She notes she is followed by a cardiologist, Dr. Ellyn Hack, and states she has a follow up appointment on 03/06/2017 for an echocardiogram after seeing him in office for the same symptoms. No alleviating factors noted. Pt denies leg swelling, abdominal pain, fevers, chills or any other associated symptoms. No other complaints noted at this time. Symptoms have been constant for the last three weeks and not significantly worsening today.    Past Medical History:  Diagnosis Date  . Anemia   . Anxiety   . Arthritis   . Asthma   . Benign positional vertigo   . Cardiac pacemaker in situ 05/01/2015   for syncope & Sx Bradycardia -> Medtronic MRI compatible pacemaker placed Dr. Caryl Comes   . Chronic diarrhea   . Chronic kidney disease, stage IV (severe) ([place])   . Colon polyps   . Diastolic dysfunction, left ventricle 11/03/2013  . Diverticulosis   . Gout   . Hemorrhoids   . Hyperlipidemia   . Hypertensive heart disease    no significant RAS by MRA 2002- (<30% LRAS)   . Hypothyroidism   . IBS (irritable bowel syndrome)   . LBBB (left bundle branch block)- new 11/01/13 11/01/2013  . Paroxysmal atrial  fibrillation 11/01/2013   Intermittent through the years and recurrent in December of 2014 treated with amiodarone;; Negative Myoview 10/2013  . Peripheral vascular disease    81-27% LICA, 5-17% RICA by doppler 2009   . Sinus arrest 04/28/2015  . Syncope and collapse 04/28/2015   s/p PPM  . Tubulovillous adenoma 4/07    Patient Active Problem List   Diagnosis Date Noted  . Shortness of breath 02/21/2017  . Insomnia, persistent 07/26/2015  . Cardiac pacemaker in situ   . Femoral neck fracture ([place]) 05/01/2015  . Right bundle branch block (RBBB) intermittent  04/28/2015  . Closed left hip fracture ([place])   . Fall   . Syncope, cardiogenic   . Abnormal liver function tests 11/10/2014  . Systolic hypertension, isolated; difficult to control 10/04/2014  . CKD (chronic kidney disease), stage IV ([place]) 07/31/2014  . Angina of effort ([place]) 07/30/2014  . Gout 06/17/2014  . Edema of both legs 02/09/2014  . Encounter for long-term (current) use of other medications 12/25/2013  . Hypertensive heart/kidney disease w/chronic kidney disease stage IV ([place]) 12/25/2013  . Chest pain 12/03/2013  . Chronic diastolic heart failure ([place]) 12/03/2013  . H/O tachycardia-bradycardia syndrome   . Chronic anticoagulation- Eliquis 11/17/2013  . LBBB (left bundle branch block)- new 11/01/13 11/01/2013  . Paroxysmal atrial fibrillation: CHA2DS2-VASc Score 5, on Eliquis 11/01/2013  . Peripheral vascular disease   . Benign positional vertigo   . Irritable bowel  syndrome 03/22/2009  . Anxiety   . Reactive airway disease 01/13/2008  . Chronic anemia   . Osteoarthritis   . Hypothyroidism   . Hyperlipidemia   . Hypertensive heart disease     Past Surgical History:  Procedure Laterality Date  . APPENDECTOMY    . CATARACT EXTRACTION    . EP IMPLANTABLE DEVICE N/A 05/01/2015   Procedure: Pacemaker Implant;  Surgeon: Deboraha Sprang, MD;  Location: [facility];  Service: Cardiovascular;  Laterality: N/A;    . HIP ARTHROPLASTY Left 05/01/2015   Procedure: ARTHROPLASTY BIPOLAR HIP (HEMIARTHROPLASTY);  Surgeon: Rod Can, MD;  Location: [facility];  Service: Orthopedics;  Laterality: Left;  . LOW ANTERIOR BOWEL RESECTION  7/08  . [place] SURGERY  2000  . NM MYOVIEW LTD  11/02/2013   Negative for ischemia or infarction. EF 60%.  . OOPHORECTOMY  1962   right  . squamous cell skin cancer removed    . TRANSTHORACIC ECHOCARDIOGRAM  11/03/2013   Echocardiogram: EF 55-60%, mild LVH, elevated bili pressures. Mild aortic valve calcification.    Current Outpatient Rx  . Order #: [id number] Class: Print  . Order #: [id number] Class: No Print  . Order #: [id number] Class: Normal  . Order #: [id number] Class: Normal  . Order #: [id number] Class: Normal  . Order #: [id number] Class: Normal  . Order #: [id number] Class: Normal  . Order #: [id number] Class: Normal  . Order #: [id number] Class: Phone In  . Order #: [id number] Class: Normal  . Order #: [id number] Class: Phone In  . Order #: [id number] Class: Historical Med    Allergies Penicillins; Xifaxan [rifaximin]; Ambien [zolpidem tartrate]; Amoxicillin; Ceftin [cefuroxime axetil]; Codeine phosphate; Colchicine; and Lipitor [atorvastatin]  Family History  Problem Relation Age of Onset  . Cancer Mother 63    unknown  . Heart attack Father   . Heart disease Brother   . Stroke Maternal Grandmother   . Lung cancer Maternal Grandfather   . Stroke Paternal Grandmother   . Colon cancer Daughter     Social History Social History  Substance Use Topics  . Smoking status: Never Smoker  . Smokeless tobacco: Never Used     Comment: never used tobacco  . Alcohol use No    Review of Systems  Constitutional: No fever/chills Eyes: No visual changes. ENT: No sore throat. Cardiovascular: +chest pain. Respiratory: +shortness of breath. Gastrointestinal: No abdominal pain.  No nausea, no vomiting.  No diarrhea.  No constipation. Genitourinary: Negative for  dysuria. Musculoskeletal: Negative for back pain. Skin: Negative for rash. Neurological: Negative for headaches, focal weakness or numbness.  10-point ROS otherwise negative.  ____________________________________________   PHYSICAL EXAM:  VITAL SIGNS: ED Triage Vitals [03/02/17 1433]  Enc Vitals Group     BP 140/78     Pulse Rate 85     Resp 20     Temp 97.9 F (36.6 C)     Temp Source Oral     SpO2 98 %   Constitutional: Alert and oriented. Well appearing and in no acute distress. Eyes: Conjunctivae are normal. Head: Atraumatic. Nose: No congestion/rhinnorhea. Mouth/Throat: Mucous membranes are moist.  Neck: No stridor.   Cardiovascular: Normal rate, regular rhythm. Good peripheral circulation. Grossly normal heart sounds.   Respiratory: Normal respiratory effort.  No retractions. Lungs CTAB. Gastrointestinal: Soft and nontender. No distention.  Musculoskeletal: No lower extremity tenderness nor edema. No gross deformities of extremities. No chest wall tenderness to palpation.  Neurologic:  Normal speech and language. No gross focal neurologic deficits are appreciated.  Skin:  Skin is warm, dry and intact. No rash noted. Psychiatric: Mood and affect are normal. Speech and behavior are normal.  ____________________________________________   LABS (all labs ordered are listed, but only abnormal results are displayed)  Labs Reviewed  BASIC METABOLIC PANEL - Abnormal; Notable for the following:       Result Value   Glucose, Bld 100 (*)    BUN 46 (*)    Creatinine, Ser 1.68 (*)    GFR calc non Af Amer 26 (*)    GFR calc Af Amer 30 (*)    All other components within normal limits  CBC WITH DIFFERENTIAL/PLATELET - Abnormal; Notable for the following:    WBC 3.2 (*)    RBC 3.15 (*)    Hemoglobin 9.5 (*)    HCT 29.7 (*)    All other components within normal limits  BRAIN NATRIURETIC PEPTIDE - Abnormal; Notable for the following:    B Natriuretic Peptide 630.9 (*)     All other components within normal limits  TROPONIN I   ____________________________________________  EKG   EKG Interpretation  Date/Time:  Sunday March 02 2017 15:36:36 EDT Ventricular Rate:  68 PR Interval:    QRS Duration: 137 QT Interval:  418 QTC Calculation: 445 R Axis:   -42 Text Interpretation:  Atrial fibrillation Ventricular premature complex Left bundle branch block No STEMI.  Confirmed by LONG MD, JOSHUA [phone]) on 03/02/2017 3:44:47 PM Also confirmed by LONG MD, JOSHUA [phone]), editor Lorenda Cahill CT, Leda Gauze [phone])  on 03/02/2017 3:45:57 PM       ____________________________________________  RADIOLOGY  Dg Chest 2 View  Result Date: 03/02/2017 CLINICAL DATA:  Shortness of breath EXAM: CHEST  2 VIEW COMPARISON:  Chest radiograph 01/11/2017 FINDINGS: Unchanged position of left chest wall pacemaker leads. Cardiac silhouette remains mildly enlarged. There is a small hiatal hernia. Calcific aortic atherosclerosis is unchanged. No focal airspace consolidation or pulmonary edema no pneumothorax or pleural effusion. IMPRESSION: 1. Unchanged cardiomegaly and calcific aortic atherosclerosis. 2. No active cardiopulmonary disease. Electronically Signed   By: Ulyses Jarred M.D.   On: 03/02/2017 15:01    ____________________________________________   PROCEDURES  Procedure(s) performed:   Procedures  None ____________________________________________   INITIAL IMPRESSION / ASSESSMENT AND PLAN / ED COURSE  Pertinent labs & imaging results that were available during my care of the patient were reviewed by me and considered in my medical decision making (see chart for details).  Patient presents to the emergency department for evaluation of worsening dyspnea over the past 3 weeks. On exam she does not have evidence of volume overload from congestive heart failure on exam or imaging. Initially she attributed her symptoms to a fall and chest wall discomfort but the discomfort is now  gone. Her exam is largely unremarkable. EKG reviewed with no acute ischemia. She is scheduled to have an echocardiogram later this week with her cardiologist who is seeing her for this issue already. Do not see an indication at this time for further testing or admission given the patient's overall appearance.   At this time, I do not feel there is any life-threatening condition present. I have reviewed and discussed all results (EKG, imaging, lab, urine as appropriate), exam findings with patient. I have reviewed nursing notes and appropriate previous records.  I feel the patient is safe to be discharged home without further emergent workup. Discussed usual and customary return precautions. Patient and family (if present) verbalize understanding and are comfortable with this plan.  Patient  will follow-up with their primary care provider. If they do not have a primary care provider, information for follow-up has been provided to them. All questions have been answered.  ____________________________________________  FINAL CLINICAL IMPRESSION(S) / ED DIAGNOSES  Final diagnoses:  Shortness of breath     MEDICATIONS GIVEN DURING THIS VISIT:  None  NEW OUTPATIENT MEDICATIONS STARTED DURING THIS VISIT:  None   Note:  This document was prepared using Dragon voice recognition software and may include unintentional dictation errors.  Nanda Quinton, MD Emergency Medicine   I personally performed the services described in this documentation, which was scribed in my presence. The recorded information has been reviewed and is accurate.       Margette Fast, MD 03/03/17 [phone]

## 2017-03-06 ENCOUNTER — Ambulatory Visit (HOSPITAL_COMMUNITY): Payer: Medicare Other | Attending: Cardiovascular Disease

## 2017-03-06 DIAGNOSIS — I351 Nonrheumatic aortic (valve) insufficiency: Secondary | ICD-10-CM | POA: Diagnosis not present

## 2017-03-06 DIAGNOSIS — I5032 Chronic diastolic (congestive) heart failure: Secondary | ICD-10-CM

## 2017-03-06 DIAGNOSIS — I340 Nonrheumatic mitral (valve) insufficiency: Secondary | ICD-10-CM | POA: Diagnosis not present

## 2017-03-06 DIAGNOSIS — I071 Rheumatic tricuspid insufficiency: Secondary | ICD-10-CM | POA: Insufficient documentation

## 2017-03-06 DIAGNOSIS — I119 Hypertensive heart disease without heart failure: Secondary | ICD-10-CM | POA: Diagnosis not present

## 2017-03-06 DIAGNOSIS — E785 Hyperlipidemia, unspecified: Secondary | ICD-10-CM | POA: Diagnosis not present

## 2017-03-06 DIAGNOSIS — R0602 Shortness of breath: Secondary | ICD-10-CM

## 2017-03-07 ENCOUNTER — Telehealth: Payer: Self-pay | Admitting: Cardiology

## 2017-03-07 NOTE — Telephone Encounter (Signed)
Pt c/o Shortness Of Breath: STAT if SOB developed within the last 24 hours or pt is noticeably SOB on the phone  1. Are you currently SOB (can you hear that pt is SOB on the phone)? no  2. How long have you been experiencing SOB? Couple weeks  3. Are you SOB when sitting or when up moving around? Moving around  4. Are you currently experiencing any other symptoms? Chest tightness

## 2017-03-07 NOTE — Telephone Encounter (Signed)
Spoke with the patient. She was calling in for her ECHO results which have not been finalized yet. She verbalized her understanding.  She stated that she was still experiencing some shortness of breath but it has been relieved "some" with her allergy medication. She denied n/v, edema and chest pain. She had been to the ED on 4/22 with SOB. She was discharged with instructions to follow up with her primary physician:  Per the ED Physician: At this time, I do not feel there is any life-threatening condition present. I have reviewed and discussed all results (EKG, imaging, lab, urine as appropriate), exam findings with patient. I have reviewed nursing notes and appropriate previous records.  I feel the patient is safe to be discharged home without further emergent workup. Discussed usual and customary return precautions. Patient and family (if present) verbalize understanding and are comfortable with this plan.  Patient will follow-up with their primary care provider.  She stated that she had not followed up with her PCP yet and was advised to go ahead and get an appointment. She did want an appointment with Dr. Ellyn Hack which has been scheduled for 5/3 at 9:40. She was advised that she could go back to the ED if she felt worse over the weekend. She verbalized her understanding.

## 2017-03-10 ENCOUNTER — Ambulatory Visit (INDEPENDENT_AMBULATORY_CARE_PROVIDER_SITE_OTHER): Payer: Medicare Other | Admitting: *Deleted

## 2017-03-10 DIAGNOSIS — I495 Sick sinus syndrome: Secondary | ICD-10-CM

## 2017-03-10 NOTE — Telephone Encounter (Signed)
Denies chest pain but having heaviness/tightness like she discussed at recent office visit. Gets short of breath with little exertion but does go away after rest. Denies any swelling. No change since office visit   Will forward to [name] for review

## 2017-03-10 NOTE — Progress Notes (Signed)
Remote pacemaker transmission.   

## 2017-03-10 NOTE — Telephone Encounter (Signed)
Pt wants to know if her Echo results are back,pt says she feels awful.She says she can not breathe abd also a heaviness in her chest.

## 2017-03-11 ENCOUNTER — Encounter: Payer: Self-pay | Admitting: Cardiology

## 2017-03-11 ENCOUNTER — Telehealth: Payer: Self-pay | Admitting: Cardiology

## 2017-03-11 LAB — CUP PACEART REMOTE DEVICE CHECK
Brady Statistic AP VS Percent: 35.87 %
Brady Statistic AS VP Percent: 16.78 %
Brady Statistic AS VS Percent: 43.73 %
Brady Statistic RV Percent Paced: 20.03 %
Implantable Lead Implant Date: 20160620
Implantable Lead Implant Date: 20160620
Implantable Lead Location: 753859
Implantable Lead Model: 5076
Implantable Lead Model: 5076
Lead Channel Impedance Value: 285 Ohm
Lead Channel Impedance Value: 437 Ohm
Lead Channel Impedance Value: 437 Ohm
Lead Channel Impedance Value: 513 Ohm
Lead Channel Pacing Threshold Amplitude: 0.5 V
Lead Channel Pacing Threshold Pulse Width: 0.4 ms
Lead Channel Sensing Intrinsic Amplitude: 13 mV
Lead Channel Sensing Intrinsic Amplitude: 13 mV
Lead Channel Setting Pacing Amplitude: 1.5 V
Lead Channel Setting Pacing Amplitude: 2 V
Lead Channel Setting Sensing Sensitivity: 4 mV
MDC IDC LEAD LOCATION: 753860
MDC IDC MSMT BATTERY REMAINING LONGEVITY: 82 mo
MDC IDC MSMT BATTERY VOLTAGE: 3.01 V
MDC IDC MSMT LEADCHNL RA PACING THRESHOLD AMPLITUDE: 0.75 V
MDC IDC MSMT LEADCHNL RA PACING THRESHOLD PULSEWIDTH: 0.4 ms
MDC IDC MSMT LEADCHNL RA SENSING INTR AMPL: 0.625 mV
MDC IDC MSMT LEADCHNL RA SENSING INTR AMPL: 0.625 mV
MDC IDC PG IMPLANT DT: 20160620
MDC IDC SESS DTM: 20180430172838
MDC IDC SET LEADCHNL RV PACING PULSEWIDTH: 0.4 ms
MDC IDC STAT BRADY AP VP PERCENT: 3.62 %
MDC IDC STAT BRADY RA PERCENT PACED: 33.44 %

## 2017-03-11 NOTE — Telephone Encounter (Signed)
Returned the phone call to the patient's daughter in law, per [name]. She was informed that the results have not been read by her physician and when they had his nurse would call them. She verbalized her understanding and appreciation.

## 2017-03-11 NOTE — Telephone Encounter (Signed)
Pt's daughter want to know the results of pt's echo. Please call

## 2017-03-12 ENCOUNTER — Other Ambulatory Visit: Payer: Self-pay | Admitting: Gastroenterology

## 2017-03-12 ENCOUNTER — Ambulatory Visit (INDEPENDENT_AMBULATORY_CARE_PROVIDER_SITE_OTHER): Payer: Medicare Other | Admitting: Family Medicine

## 2017-03-12 ENCOUNTER — Encounter: Payer: Self-pay | Admitting: Family Medicine

## 2017-03-12 VITALS — BP 132/86 | HR 91 | Temp 97.7°F | Ht 63.0 in | Wt 140.0 lb

## 2017-03-12 DIAGNOSIS — D509 Iron deficiency anemia, unspecified: Secondary | ICD-10-CM

## 2017-03-12 DIAGNOSIS — Z8679 Personal history of other diseases of the circulatory system: Secondary | ICD-10-CM

## 2017-03-12 DIAGNOSIS — D649 Anemia, unspecified: Secondary | ICD-10-CM

## 2017-03-12 DIAGNOSIS — I5032 Chronic diastolic (congestive) heart failure: Secondary | ICD-10-CM | POA: Diagnosis not present

## 2017-03-12 DIAGNOSIS — I480 Paroxysmal atrial fibrillation: Secondary | ICD-10-CM | POA: Diagnosis not present

## 2017-03-12 DIAGNOSIS — R0602 Shortness of breath: Secondary | ICD-10-CM | POA: Diagnosis not present

## 2017-03-12 LAB — CBC WITH DIFFERENTIAL/PLATELET
Basophils Absolute: 0 10*3/uL (ref 0.0–0.1)
Basophils Relative: 0.9 % (ref 0.0–3.0)
EOS ABS: 0.1 10*3/uL (ref 0.0–0.7)
EOS PCT: 1.4 % (ref 0.0–5.0)
HEMATOCRIT: 32.5 % — AB (ref 36.0–46.0)
HEMOGLOBIN: 10.7 g/dL — AB (ref 12.0–15.0)
LYMPHS PCT: 34.2 % (ref 12.0–46.0)
Lymphs Abs: 1.3 10*3/uL (ref 0.7–4.0)
MCHC: 32.8 g/dL (ref 30.0–36.0)
MCV: 92.9 fl (ref 78.0–100.0)
MONOS PCT: 13.1 % — AB (ref 3.0–12.0)
Monocytes Absolute: 0.5 10*3/uL (ref 0.1–1.0)
NEUTROS ABS: 1.9 10*3/uL (ref 1.4–7.7)
Neutrophils Relative %: 50.4 % (ref 43.0–77.0)
PLATELETS: 200 10*3/uL (ref 150.0–400.0)
RBC: 3.5 Mil/uL — ABNORMAL LOW (ref 3.87–5.11)
RDW: 14.4 % (ref 11.5–15.5)
WBC: 3.8 10*3/uL — AB (ref 4.0–10.5)

## 2017-03-12 LAB — FOLATE: Folate: 11.9 ng/mL (ref >5.9)

## 2017-03-12 LAB — FERRITIN: Ferritin: 12.8 ng/mL (ref 10.0–291.0)

## 2017-03-12 LAB — VITAMIN B12

## 2017-03-12 LAB — IRON: IRON: 54 ug/dL (ref 42–145)

## 2017-03-12 LAB — TSH: TSH: 3.51 u[IU]/mL (ref 0.35–4.50)

## 2017-03-12 NOTE — Patient Instructions (Signed)
WE NOW OFFER   [facility] FAST TRACK!!!  SAME DAY Appointments for ACUTE CARE  Such as: Sprains, Injuries, cuts, abrasions, rashes, muscle pain, joint pain, back pain Colds, flu, sore throats, headache, allergies, cough, fever  Ear pain, sinus and eye infections Abdominal pain, nausea, vomiting, diarrhea, upset stomach Animal/insect bites  3 Easy Ways to Schedule: Walk-In Scheduling Call in scheduling Mychart Sign-up: [email or web address]         

## 2017-03-12 NOTE — Progress Notes (Signed)
Pre visit review using our clinic review tool, if applicable. No additional management support is needed unless otherwise documented below in the visit note. 

## 2017-03-12 NOTE — Progress Notes (Signed)
   Subjective:    Patient ID: [name], female    DOB: [birth year], 83 y.o.   MRN: [id number]  HPI Here to discuss fatigue and SOB on exertion. She has felt this SOB for about a month, and she denies any coughing or chest pain. She saw Dr. Ellyn Hack about this on 02-21-17 and he flet her exam and EKG were unremarkable. She is in chronic atrial fibrillation but her rate control is adequate. He ordered an ECHO which was unremarkable. She then went to the ER on 03-02-17 and she was told nothing could be found to explain her SOB. As I review the lab results form that day however I see her Hgb has dropped from 11.6 nine months ago to 9.5. She denies any heartburn or abdominal pain. She has IBS and her stools alternate from constipation to diarrhea, but she has never seen blodd or black stools. She has a hx of iron deficiency but she cannot tolerate oral iron supplements at all.    Review of Systems  Constitutional: Positive for fatigue. Negative for unexpected weight change.  Respiratory: Positive for shortness of breath. Negative for cough, chest tightness and wheezing.   Cardiovascular: Negative.   Neurological: Negative.        Objective:   Physical Exam  Constitutional: She is oriented to person, place, and time. She appears well-developed and well-nourished. No distress.  Neck: No thyromegaly present.  Cardiovascular: Normal rate, normal heart sounds and intact distal pulses.   Irregular rhythm   Pulmonary/Chest: Effort normal and breath sounds normal. No respiratory distress. She has no wheezes. She has no rales. She exhibits no tenderness.  Abdominal: Soft. Bowel sounds are normal. She exhibits no distension and no mass. There is no tenderness. There is no rebound and no guarding.  Lymphadenopathy:    She has no cervical adenopathy.  Neurological: She is alert and oriented to person, place, and time.          Assessment & Plan:  SOB on exertion. I think her anemia is playing a  large role in this. We will work this up further today with ferritin, B12, etc. If this turns out to be due to iron deficiency I think she would be an excellent candidate for iron infusion treatments.  [name], MD

## 2017-03-13 ENCOUNTER — Encounter: Payer: Self-pay | Admitting: Cardiology

## 2017-03-13 ENCOUNTER — Ambulatory Visit (INDEPENDENT_AMBULATORY_CARE_PROVIDER_SITE_OTHER): Payer: Medicare Other | Admitting: Cardiology

## 2017-03-13 VITALS — BP 148/88 | HR 81 | Ht 63.0 in | Wt 139.6 lb

## 2017-03-13 DIAGNOSIS — I1310 Hypertensive heart and chronic kidney disease without heart failure, with stage 1 through stage 4 chronic kidney disease, or unspecified chronic kidney disease: Secondary | ICD-10-CM | POA: Diagnosis not present

## 2017-03-13 DIAGNOSIS — I5032 Chronic diastolic (congestive) heart failure: Secondary | ICD-10-CM

## 2017-03-13 DIAGNOSIS — I208 Other forms of angina pectoris: Secondary | ICD-10-CM

## 2017-03-13 DIAGNOSIS — N184 Chronic kidney disease, stage 4 (severe): Secondary | ICD-10-CM | POA: Diagnosis not present

## 2017-03-13 DIAGNOSIS — I480 Paroxysmal atrial fibrillation: Secondary | ICD-10-CM | POA: Diagnosis not present

## 2017-03-13 DIAGNOSIS — I10 Essential (primary) hypertension: Secondary | ICD-10-CM | POA: Diagnosis not present

## 2017-03-13 DIAGNOSIS — Z7901 Long term (current) use of anticoagulants: Secondary | ICD-10-CM

## 2017-03-13 DIAGNOSIS — R0609 Other forms of dyspnea: Secondary | ICD-10-CM | POA: Diagnosis not present

## 2017-03-13 DIAGNOSIS — I447 Left bundle-branch block, unspecified: Secondary | ICD-10-CM

## 2017-03-13 MED ORDER — HYDRALAZINE HCL 50 MG PO TABS: 50.0000 mg | ORAL_TABLET | Freq: Three times a day (TID) | ORAL | 6 refills | Status: DC

## 2017-03-13 MED ORDER — CARVEDILOL 12.5 MG PO TABS: 12.5000 mg | ORAL_TABLET | Freq: Two times a day (BID) | ORAL | 16 refills | Status: DC

## 2017-03-13 NOTE — Progress Notes (Signed)
PCP: [name], MD  Clinic Note: Chief Complaint  Patient presents with  . Follow-up    Pt. complains of SOB  . Atrial Fibrillation    HFPEF    HPI: [name] is a 83 y.o. female with a PMH below who presents today Follow-up to discuss results her echocardiogram and symptoms of diastolic heart failure with progressive dyspnea.. She has a history of chronic atrial fibrillation status post pacemaker placement for syncope with complete heart block in June 2016. At that time she had a syncopal event and fell fracturing her hip. Recent Hospitalizations:.   ER visit on March 3 following a mechanical fall. He fractured her patella and had some pleuritic chest pains.  ER visit April 22 - with complaints of shortness of breath. Evaluation there was minimal and not forthcoming. Plan was for her to continue to follow-up with her echocardiogram and evaluation is being done already.  [name] was last seen on April 13 very shaken up from her fall. She had a significant bruise on her left side of her chest from her fall. She says that all of her symptoms of dyspnea began after she fell and hurt her knee. We evaluated her with an echocardiogram see below.  Studies Personally Reviewed - if available, images/films reviewed: From Epic Chart or Care Everywhere  2 D Echo: Normal LV size and function with mild LVH. Unable to assess diastolic function. Normal PA pressures. Mild MR,Mild AI  Interval History: [name] presents today really exasperated because she just is not feeling any better. She is still short of breath with doing just that anything. She is not noticing any chest pain or significant palpitations, she just simply feels like she can't get his breath to do anything. She starts to do things and just has to stop. There is no associated PND orthopnea or edema associated with her dyspnea. When she did get short of breath she does feel lightheaded and dizzy, but has not had any  syncope or near syncope. No associated fevers, chills or coughs to suggest an infectious issue.  There was a suggestion of possible anemia because one of her hemoglobin counts been down to 9.5 range. Her PCP is just checked her labs, and her hemoglobin is back up to 10.7 making that less likely the etiology.  She really denies any exertional chest tightness or pressure pain. The pain from her bruised ribs is improving, but is still present. Other than feeling tired she just feels fatigued with absolute no energy which leads me to believe there may be a component of her simply having deconditioning. Her son is concerned that maybe the main problem.  Cardiac Review of Symptoms: No PND, orthopnea or edema. No palpitations, lightheadedness, dizziness, weakness or syncope/near syncope. No TIA/amaurosis fugax symptoms. No melena, hematochezia, hematuria, or epstaxis. No claudication.  ROS: A comprehensive was performed. Pertinent positives and negatives noted above Review of Systems  Constitutional: Positive for malaise/fatigue.  HENT: Negative for congestion and sinus pain.   Respiratory: Negative for cough and wheezing.   Cardiovascular: Positive for chest pain (Left chest wall pain is improving, but she still has a bruise).  Gastrointestinal:       Intermittent constipation versus diarrhea from IBS  Musculoskeletal: Positive for joint pain (Still has knee pain from her fall). Negative for falls (No recent falls).  Neurological: Positive for dizziness.  Endo/Heme/Allergies: Negative for environmental allergies. Does not bruise/bleed easily.  Psychiatric/Behavioral: Positive for memory loss. The patient  is nervous/anxious.   All other systems reviewed and are negative.   I have reviewed and (if needed) personally updated the patient's problem list, medications, allergies, past medical and surgical history, social and family history.   Past Medical History:  Diagnosis Date  . Anemia   .  Anxiety   . Arthritis   . Asthma   . Benign positional vertigo   . Cardiac pacemaker in situ 05/01/2015   for syncope & Sx Bradycardia -> Medtronic MRI compatible pacemaker placed Dr. Caryl Comes   . Chronic diarrhea   . Chronic kidney disease, stage IV (severe) ([place])   . Colon polyps   . Diastolic dysfunction, left ventricle 11/03/2013  . Diverticulosis   . Gout   . Hemorrhoids   . Hyperlipidemia   . Hypertensive heart disease    no significant RAS by MRA 2002- (<30% LRAS)   . Hypothyroidism   . IBS (irritable bowel syndrome)   . LBBB (left bundle branch block)- new 11/01/13 11/01/2013  . Paroxysmal atrial fibrillation 11/01/2013   Intermittent through the years and recurrent in December of 2014 treated with amiodarone;; Negative Myoview 10/2013  . Peripheral vascular disease    50-93% LICA, 2-67% RICA by doppler 2009   . Sinus arrest 04/28/2015  . Syncope and collapse 04/28/2015   s/p PPM  . Tubulovillous adenoma 4/07    Past Surgical History:  Procedure Laterality Date  . APPENDECTOMY    . CATARACT EXTRACTION    . EP IMPLANTABLE DEVICE N/A 05/01/2015   Procedure: Pacemaker Implant;  Surgeon: Deboraha Sprang, MD;  Location: [facility];  Service: Cardiovascular;  Laterality: N/A;  . HIP ARTHROPLASTY Left 05/01/2015   Procedure: ARTHROPLASTY BIPOLAR HIP (HEMIARTHROPLASTY);  Surgeon: Rod Can, MD;  Location: [facility];  Service: Orthopedics;  Laterality: Left;  . LOW ANTERIOR BOWEL RESECTION  7/08  . [place] SURGERY  2000  . NM MYOVIEW LTD  11/02/2013   Negative for ischemia or infarction. EF 60%.  . OOPHORECTOMY  1962   right  . squamous cell skin cancer removed    . TRANSTHORACIC ECHOCARDIOGRAM  10/2013, 02/2017   A) EF 55-60%, mild LVH, elevated bili pressures. Mild aortic valve calcification;; B) Normal LV size and function with mild LVH. Unable to assess diastolic function. Normal PA pressures. Mild MR,Mild AI    Current Meds  Medication Sig  . acetaminophen  (TYLENOL) 500 MG tablet Take 1.5 tablets (750 mg total) by mouth every 6 (six) hours as needed.  Marland Kitchen amLODipine (NORVASC) 5 MG tablet Take 1 tablet (5 mg total) by mouth daily.  . CVS VITAMIN B12 1000 MCG tablet TAKE 1 TABLET BY MOUTH EVERY DAY  . ELIQUIS 2.5 MG TABS tablet TAKE 1 TABLET BY MOUTH TWICE A DAY  . furosemide (LASIX) 40 MG tablet Take 1 tablet (40 mg total) by mouth daily as needed for fluid (more than 2-3 pounds overnight and/or 5 pounds in a week).  Marland Kitchen levothyroxine (SYNTHROID, LEVOTHROID) 88 MCG tablet TAKE 1 TABLET (88 MCG TOTAL) BY MOUTH DAILY BEFORE BREAKFAST.  Marland Kitchen LORazepam (ATIVAN) 0.5 MG tablet TAKE 1 TABLET BY MOUTH EVERY 8 HOURS AS NEEDED  . nitroGLYCERIN (NITROSTAT) 0.4 MG SL tablet Place 1 tablet (0.4 mg total) under the tongue every 5 (five) minutes x 3 doses as needed for chest pain.  Marland Kitchen temazepam (RESTORIL) 30 MG capsule TAKE ONE CAPSULE BY MOUTH EVERY DAY AT BEDTIME AS NEEDED  . ULORIC 40 MG tablet Take 1 tablet by mouth  daily.  . [DISCONTINUED] carvedilol (COREG) 6.25 MG tablet Take 1 tablet (6.25 mg total) by mouth 2 (two) times daily.  . [DISCONTINUED] hydrALAZINE (APRESOLINE) 100 MG tablet TAKE 1 TABLET BY MOUTH TWICE A DAY    Allergies  Allergen Reactions  . Penicillins Swelling    REACTION: nausea, swelling  . Xifaxan [Rifaximin] Other (See Comments) and Rash    unknown  . Ambien [Zolpidem Tartrate]     " made her crazy "  . Amoxicillin     REACTION: unspecified  . Ceftin [Cefuroxime Axetil] Diarrhea  . Codeine Phosphate     REACTION: unspecified  . Colchicine     Severe diarrhea  . Lipitor [Atorvastatin] Other (See Comments)    "makes legs jump all night"    Social History   Social History  . Marital status: Widowed    Spouse name: N/A  . Number of children: 2  . Years of education: N/A   Occupational History  . retired    Social History Main Topics  . Smoking status: Never Smoker  . Smokeless tobacco: Never Used     Comment: never used  tobacco  . Alcohol use No  . Drug use: No  . Sexual activity: No   Other Topics Concern  . None   Social History Narrative   She is a widowed mother of 2, one child is deceased. Grandmother of one. She appears to be working out routinely at [employer] using [employer]. She is otherwise quite active.   She does not drink alcohol, does not smoke.  She is retired from [employer].    Lives alone in a one story condo.    family history includes Cancer (age of onset: 22) in her mother; Colon cancer in her daughter; Heart attack in her father; Heart disease in her brother; Lung cancer in her maternal grandfather; Stroke in her maternal grandmother and paternal grandmother.  Wt Readings from Last 3 Encounters:  03/13/17 139 lb 9.6 oz (63.3 kg)  03/12/17 140 lb (63.5 kg)  02/21/17 138 lb 12.8 oz (63 kg)    PHYSICAL EXAM BP (!) 148/88   Pulse 81   Ht 5\' 3"  (1.6 m)   Wt 139 lb 9.6 oz (63.3 kg)   BMI 24.73 kg/m  General appearance:Pleasant elderly woman who is thin but not cachectic. Nontoxic appearing. She just seems tired and frustrated. She is well-nourished and well-groomed. Pleasant mood and affect. Neck: no adenopathy, no carotid bruit and no JVD Lungs: CTA B with some distant breath sounds. No significant wheezing. Normal percussion. Nonlabored. Heart: Irregularly irregular rhythm with normal rate, normal S1 and split S2. 1/6 SEM at RUSB. Otherwise no R/G. Nonspecific MI. Abdomen: soft, non-tender; bowel sounds normal; no masses,  no organomegaly; no HJR Extremities: extremities normal, atraumatic, no cyanosis. Edema -none Pulses: 2+ and symmetric; Skin: mobility and turgor normal, no lesions noted, temperature normal and texture normal Neurologic: Mental status: Alert, oriented, thought content appropriate    Adult ECG Report EKG from ER visit on April 22 -- A. fib rate 68. LBBB with PVC and left axis deviation.   Other studies Reviewed: Additional  studies/ records that were reviewed today include:  Recent Labs:    Lab Results  Component Value Date   CREATININE 1.68 (H) 03/02/2017   BUN 46 (H) 03/02/2017   NA 138 03/02/2017   K 4.5 03/02/2017   CL 106 03/02/2017   CO2 25 03/02/2017  (stable) CBC Latest Ref  Rng & Units 03/12/2017 03/02/2017 01/11/2017  WBC 4.0 - 10.5 K/uL 3.8(L) 3.2(L) 5.0  Hemoglobin 12.0 - 15.0 g/dL 10.7(L) 9.5(L) 10.1(L)  Hematocrit 36.0 - 46.0 % 32.5(L) 29.7(L) 31.1(L)  Platelets 150.0 - 400.0 K/uL 200.0 167 177     ASSESSMENT / PLAN: Problem List Items Addressed This Visit    [facility] of effort ([place]) - Primary    We have done a pretty extensive evaluation of her exertional dyspnea. With her left bundle branch block and A. fib, she clearly is at risk for coronary disease. Her last stress test was 4 years ago. Looking for completion of her evaluation we should proceed with an ischemic evaluation. I had a long talk with her and her son about her wishes as far as evaluations and procedures ago. She seemed to be inclined to proceed with cardiac catheterization if indicated with a stress test.   Plan for now is [facility], and if found to be abnormal, we would plan to schedule cardiac catheterization to exclude ischemic CAD.      Relevant Medications   carvedilol (COREG) 12.5 MG tablet   hydrALAZINE (APRESOLINE) 50 MG tablet   Other Relevant Orders   Myocardial Perfusion Imaging   Chronic anticoagulation- Eliquis (Chronic)   Chronic diastolic heart failure (HCC) (Chronic)   Relevant Medications   carvedilol (COREG) 12.5 MG tablet   hydrALAZINE (APRESOLINE) 50 MG tablet   Dyspnea on exertion (Chronic)    Echo was not fully revealing. I was looking for potential cardiac contusion which did not pan out. There is no sign of pericardial effusion.  Plan is Myoview stress test.      Relevant Orders   Myocardial Perfusion Imaging   Hypertensive heart/kidney disease w/chronic kidney disease stage IV ([place])     She does have a nephrologist for her CK D with creatinine of but 0.6. If we are going to do cardiac catheterization, it would be coronary angiography only with potential staged PCI. She would need more extensive hydration.      Relevant Medications   carvedilol (COREG) 12.5 MG tablet   hydrALAZINE (APRESOLINE) 50 MG tablet   LBBB (left bundle branch block)- new 11/01/13 (Chronic)    This is now chronic and was initially evaluated with a Myoview back in 2014 that was negative. It is not adversely affected her EF despite the combination with A. fib.      Relevant Medications   carvedilol (COREG) 12.5 MG tablet   hydrALAZINE (APRESOLINE) 50 MG tablet   Paroxysmal atrial fibrillation: CHA2DS2-VASc Score 5, on Eliquis (Chronic)    She seems to be more persistent A. fib. If ischemic evaluation is negative, we may need to consider the possibility of antiarrhythmic treatment to maintain sinus rhythm not sure that cardioversion would be a great option because she probably would likely go right back into it.  She is anticoagulated with ELIQUIS, and her hemoglobin levels have stabilized.      Relevant Medications   carvedilol (COREG) 12.5 MG tablet   hydrALAZINE (APRESOLINE) 50 MG tablet   Systolic hypertension, isolated; difficult to control (Chronic)    Her blood pressure still borderline today. My issues I don't want her to be susceptible for further falls with near syncope. With A. fib playing a role, I would like to see if he can improve her rate control by increasing her carvedilol to 12.5 twice a day and in doing so we will also reduce her hydralazine dose to 50 mg twice a day.  Relevant Medications   carvedilol (COREG) 12.5 MG tablet   hydrALAZINE (APRESOLINE) 50 MG tablet      Current medicines are reviewed at length with the patient today. (+/- concerns) n/a The following changes have been made: - see below  Patient Instructions  Schedule at [address] Your  physician has requested that you have a lexiscan myoview. For further information please visit [email or web address]. Please follow instruction sheet, as given.  MEDICATION CHANGES -INCREASE CARVEDILOL TO 12.5 MG  ONE TABLET TWICE A DAY   (MAY TAKE 2 TABLETS  6.25 MG TWICE A DAY UNTIL BOTTLE IS FINISHED  -DECREASE HYDRALAZINE TO 50 MG ONE TABLET TWICE A DAY  ( MAY TAKE 1/2 OF 100 MG TWICE A DAY TO FINISH THE BOTTLE)   If test come back abnormal , schedule for cardiac cath.   Your physician recommends that you schedule a follow-up appointment in 1 month with [name].    Studies Ordered:   Orders Placed This Encounter  Procedures  . Myocardial Perfusion Imaging      Glenetta Hew, M.D., M.S. Interventional Cardiologist   Pager # [phone] Phone # [phone] [address] [place], [place] [address]

## 2017-03-13 NOTE — Addendum Note (Signed)
Addended by: [name] A on: 03/13/2017 02:44 PM   Modules accepted: Orders

## 2017-03-13 NOTE — Patient Instructions (Addendum)
Schedule at [address] Your physician has requested that you have a lexiscan myoview. For further information please visit [email or web address]. Please follow instruction sheet, as given.  MEDICATION CHANGES -INCREASE CARVEDILOL TO 12.5 MG  ONE TABLET TWICE A DAY   (MAY TAKE 2 TABLETS  6.25 MG TWICE A DAY UNTIL BOTTLE IS FINISHED  -DECREASE HYDRALAZINE TO 50 MG ONE TABLET TWICE A DAY  ( MAY TAKE 1/2 OF 100 MG TWICE A DAY TO FINISH THE BOTTLE)   If test come back abnormal , schedule for cardiac cath.   Your physician recommends that you schedule a follow-up appointment in 1 month with [name].

## 2017-03-14 ENCOUNTER — Telehealth (HOSPITAL_COMMUNITY): Payer: Self-pay

## 2017-03-14 NOTE — Telephone Encounter (Signed)
PATIENT HAD APPOINTMENT 03/13/17. [facility] ORDERED ENCOUNTER CLOSED

## 2017-03-14 NOTE — Telephone Encounter (Signed)
Encounter complete. 

## 2017-03-15 ENCOUNTER — Encounter: Payer: Self-pay | Admitting: Cardiology

## 2017-03-15 NOTE — Assessment & Plan Note (Signed)
This is now chronic and was initially evaluated with a Myoview back in 2014 that was negative. It is not adversely affected her EF despite the combination with A. fib.

## 2017-03-15 NOTE — Assessment & Plan Note (Signed)
She seems to be more persistent A. fib. If ischemic evaluation is negative, we may need to consider the possibility of antiarrhythmic treatment to maintain sinus rhythm not sure that cardioversion would be a great option because she probably would likely go right back into it.  She is anticoagulated with ELIQUIS, and her hemoglobin levels have stabilized.

## 2017-03-15 NOTE — Assessment & Plan Note (Signed)
Her blood pressure still borderline today. My issues I don't want her to be susceptible for further falls with near syncope. With A. fib playing a role, I would like to see if he can improve her rate control by increasing her carvedilol to 12.5 twice a day and in doing so we will also reduce her hydralazine dose to 50 mg twice a day.

## 2017-03-15 NOTE — Assessment & Plan Note (Signed)
Echo was not fully revealing. I was looking for potential cardiac contusion which did not pan out. There is no sign of pericardial effusion.  Plan is Myoview stress test.

## 2017-03-15 NOTE — Assessment & Plan Note (Signed)
She does have a nephrologist for her CK D with creatinine of but 0.6. If we are going to do cardiac catheterization, it would be coronary angiography only with potential staged PCI. She would need more extensive hydration.

## 2017-03-15 NOTE — Assessment & Plan Note (Signed)
We have done a pretty extensive evaluation of her exertional dyspnea. With her left bundle branch block and A. fib, she clearly is at risk for coronary disease. Her last stress test was 4 years ago. Looking for completion of her evaluation we should proceed with an ischemic evaluation. I had a long talk with her and her son about her wishes as far as evaluations and procedures ago. She seemed to be inclined to proceed with cardiac catheterization if indicated with a stress test.   Plan for now is [facility], and if found to be abnormal, we would plan to schedule cardiac catheterization to exclude ischemic CAD.

## 2017-03-18 ENCOUNTER — Ambulatory Visit (HOSPITAL_COMMUNITY)
Admission: RE | Admit: 2017-03-18 | Discharge: 2017-03-18 | Disposition: A | Discharge status: Home / Self Care | Payer: Medicare Other | Source: Ambulatory Visit | Attending: Cardiology | Admitting: Cardiology

## 2017-03-18 DIAGNOSIS — I480 Paroxysmal atrial fibrillation: Secondary | ICD-10-CM | POA: Insufficient documentation

## 2017-03-18 DIAGNOSIS — I130 Hypertensive heart and chronic kidney disease with heart failure and stage 1 through stage 4 chronic kidney disease, or unspecified chronic kidney disease: Secondary | ICD-10-CM | POA: Insufficient documentation

## 2017-03-18 DIAGNOSIS — J45909 Unspecified asthma, uncomplicated: Secondary | ICD-10-CM | POA: Insufficient documentation

## 2017-03-18 DIAGNOSIS — E079 Disorder of thyroid, unspecified: Secondary | ICD-10-CM | POA: Insufficient documentation

## 2017-03-18 DIAGNOSIS — R0609 Other forms of dyspnea: Secondary | ICD-10-CM | POA: Diagnosis not present

## 2017-03-18 DIAGNOSIS — R001 Bradycardia, unspecified: Secondary | ICD-10-CM | POA: Insufficient documentation

## 2017-03-18 DIAGNOSIS — R55 Syncope and collapse: Secondary | ICD-10-CM | POA: Diagnosis not present

## 2017-03-18 DIAGNOSIS — N184 Chronic kidney disease, stage 4 (severe): Secondary | ICD-10-CM | POA: Diagnosis not present

## 2017-03-18 DIAGNOSIS — I5032 Chronic diastolic (congestive) heart failure: Secondary | ICD-10-CM | POA: Diagnosis not present

## 2017-03-18 DIAGNOSIS — Z950 Presence of cardiac pacemaker: Secondary | ICD-10-CM | POA: Insufficient documentation

## 2017-03-18 DIAGNOSIS — I208 Other forms of angina pectoris: Secondary | ICD-10-CM | POA: Diagnosis not present

## 2017-03-18 DIAGNOSIS — R000 Tachycardia, unspecified: Secondary | ICD-10-CM | POA: Diagnosis not present

## 2017-03-18 DIAGNOSIS — I455 Other specified heart block: Secondary | ICD-10-CM | POA: Diagnosis not present

## 2017-03-18 LAB — MYOCARDIAL PERFUSION IMAGING
CHL CUP NUCLEAR SDS: 7
CHL CUP RESTING HR STRESS: 63 {beats}/min
LV dias vol: 120 mL (ref 46–106)
LV sys vol: 54 mL
NUC STRESS TID: 0.94
Peak HR: 67 {beats}/min
SRS: 1
SSS: 7

## 2017-03-18 MED ORDER — AMINOPHYLLINE 25 MG/ML IV SOLN
75.0000 mg | Freq: Once | INTRAVENOUS | Status: AC
  Administered 2017-03-18: 75 mg via INTRAVENOUS

## 2017-03-18 MED ORDER — REGADENOSON 0.4 MG/5ML IV SOLN
0.4000 mg | Freq: Once | INTRAVENOUS | Status: AC
  Administered 2017-03-18: 0.4 mg via INTRAVENOUS

## 2017-03-18 MED ORDER — TECHNETIUM TC 99M TETROFOSMIN IV KIT
30.6000 | PACK | Freq: Once | INTRAVENOUS | Status: AC | PRN
  Administered 2017-03-18: 30.6 via INTRAVENOUS
  Filled 2017-03-18: qty 31

## 2017-03-18 MED ORDER — TECHNETIUM TC 99M TETROFOSMIN IV KIT
10.6000 | PACK | Freq: Once | INTRAVENOUS | Status: AC | PRN
  Administered 2017-03-18: 10.6 via INTRAVENOUS
  Filled 2017-03-18: qty 11

## 2017-03-19 ENCOUNTER — Other Ambulatory Visit: Payer: Self-pay | Admitting: Family Medicine

## 2017-03-19 ENCOUNTER — Other Ambulatory Visit: Payer: Self-pay | Admitting: Family

## 2017-03-19 ENCOUNTER — Other Ambulatory Visit: Payer: Self-pay | Admitting: Gastroenterology

## 2017-03-19 DIAGNOSIS — D631 Anemia in chronic kidney disease: Secondary | ICD-10-CM

## 2017-03-19 DIAGNOSIS — D508 Other iron deficiency anemias: Secondary | ICD-10-CM

## 2017-03-19 DIAGNOSIS — N184 Chronic kidney disease, stage 4 (severe): Secondary | ICD-10-CM

## 2017-03-20 ENCOUNTER — Ambulatory Visit: Payer: Medicare Other

## 2017-03-20 ENCOUNTER — Other Ambulatory Visit (HOSPITAL_BASED_OUTPATIENT_CLINIC_OR_DEPARTMENT_OTHER): Payer: Medicare Other

## 2017-03-20 ENCOUNTER — Ambulatory Visit (HOSPITAL_BASED_OUTPATIENT_CLINIC_OR_DEPARTMENT_OTHER): Payer: Medicare Other | Admitting: Family

## 2017-03-20 DIAGNOSIS — D508 Other iron deficiency anemias: Secondary | ICD-10-CM

## 2017-03-20 DIAGNOSIS — D509 Iron deficiency anemia, unspecified: Secondary | ICD-10-CM | POA: Diagnosis not present

## 2017-03-20 DIAGNOSIS — N184 Chronic kidney disease, stage 4 (severe): Secondary | ICD-10-CM

## 2017-03-20 DIAGNOSIS — D631 Anemia in chronic kidney disease: Secondary | ICD-10-CM

## 2017-03-20 DIAGNOSIS — D649 Anemia, unspecified: Secondary | ICD-10-CM

## 2017-03-20 LAB — CBC WITH DIFFERENTIAL (CANCER CENTER ONLY)
BASO#: 0 10*3/uL (ref 0.0–0.2)
BASO%: 0.7 % (ref 0.0–2.0)
EOS ABS: 0 10*3/uL (ref 0.0–0.5)
EOS%: 1.4 % (ref 0.0–7.0)
HCT: 31 % — ABNORMAL LOW (ref 34.8–46.6)
HEMOGLOBIN: 9.8 g/dL — AB (ref 11.6–15.9)
LYMPH#: 1 10*3/uL (ref 0.9–3.3)
LYMPH%: 34 % (ref 14.0–48.0)
MCH: 30.2 pg (ref 26.0–34.0)
MCHC: 31.6 g/dL — ABNORMAL LOW (ref 32.0–36.0)
MCV: 96 fL (ref 81–101)
MONO#: 0.3 10*3/uL (ref 0.1–0.9)
MONO%: 11.1 % (ref 0.0–13.0)
NEUT%: 52.8 % (ref 39.6–80.0)
NEUTROS ABS: 1.5 10*3/uL (ref 1.5–6.5)
Platelets: 160 10*3/uL (ref 145–400)
RBC: 3.24 10*6/uL — AB (ref 3.70–5.32)
RDW: 14.3 % (ref 11.1–15.7)
WBC: 2.9 10*3/uL — AB (ref 3.9–10.0)

## 2017-03-20 LAB — COMPREHENSIVE METABOLIC PANEL (CC13)
A/G RATIO: 1.9 (ref 1.2–2.2)
ALK PHOS: 79 IU/L (ref 39–117)
ALT: 29 IU/L (ref 0–32)
AST: 25 IU/L (ref 0–40)
Albumin, Serum: 4.4 g/dL (ref 3.2–4.6)
BUN/Creatinine Ratio: 27 (ref 12–28)
BUN: 39 mg/dL — ABNORMAL HIGH (ref 10–36)
Bilirubin Total: 0.3 mg/dL (ref 0.0–1.2)
CALCIUM: 9.5 mg/dL (ref 8.7–10.3)
CO2: 23 mmol/L (ref 18–29)
Chloride, Ser: 107 mmol/L — ABNORMAL HIGH (ref 96–106)
Creatinine, Ser: 1.44 mg/dL — ABNORMAL HIGH (ref 0.57–1.00)
GFR calc Af Amer: 37 mL/min/{1.73_m2} — ABNORMAL LOW (ref >59)
GFR, EST NON AFRICAN AMERICAN: 32 mL/min/{1.73_m2} — AB (ref >59)
Globulin, Total: 2.3 g/dL (ref 1.5–4.5)
Glucose: 95 mg/dL (ref 65–99)
POTASSIUM: 4.4 mmol/L (ref 3.5–5.2)
Sodium: 139 mmol/L (ref 134–144)
Total Protein: 6.7 g/dL (ref 6.0–8.5)

## 2017-03-20 LAB — IRON AND TIBC
%SAT: 8 % — AB (ref 21–57)
Iron: 36 ug/dL — ABNORMAL LOW (ref 41–142)
TIBC: 425 ug/dL (ref 236–444)
UIBC: 389 ug/dL — AB (ref 120–384)

## 2017-03-20 LAB — LACTATE DEHYDROGENASE: LDH: 164 U/L (ref 125–245)

## 2017-03-20 NOTE — Progress Notes (Signed)
Hematology/Oncology Consultation   Name: [name]      MRN: [id number]    Location: Room/bed info not found  Date: 03/20/2017 Time:9:59 PM   REFERRING PHYSICIAN: Annie Main A. Sarajane Jews, MD  REASON FOR CONSULT: Iron deficiency anemia   DIAGNOSIS: Iron deficiency anemia  HISTORY OF PRESENT ILLNESS: [name] is a very pleasant 83 yo caucasian female with history of iron deficiency anemia. She was previously seen by Dr. Marin Olp for this same issue. Lindsey last visit with our office with in April 2015 so she is here today to re-establish care.  She is symptomatic at this time with fatigue and weakness.  She last received IV iron in December 2014. Hgb today is 9.8. Iron saturation is down to 8%.  She was unable to tolerate oral iron supplements due to GI upset.  She states that she had a stress test and ECHO which were negative.  She states that she has not had any blood in Lindsey stool. No episodes of bleeding, bruising or petechiae.  She has a pacemaker and history of atrial fib. She takes Eliquis 2.5 mg PO BID.  She states that she did have a portion of Lindsey bowel removed years ago due to a polyp and history of precancerous polyps. Lindsey last colonoscopy was in 2014.  Mammogram in September was negative.  No fever, chills, n/v ,cough, rash, dizziness, SOB, chest pain, palpitations, abdominal pain or changes in bowel or bladder habits.  She states that she fell 6-8 weeks ago and broke Lindsey right kneecap. She recently was able to stop wearing Lindsey knee brace. She has had no other falls and no syncopal episodes.  No swelling, tenderness, numbness or tingling in Lindsey extremities. She has generalized aches and pains due to arthritis that came and go. No c/o pain at this time.  She has maintained a good appetite and is staying well hydrated. Lindsey weight is stable. She lives alone in Lindsey own town home.   She enjoys going on day trips with 3 of Lindsey friends. She is the driver.  She usually goes to the gym 3 days a  weak but has cut back on this due to the fatigue. [name] was also a patient of Dr. Dicie Beam 13 years ago. Unfortunately she passed away with rectal cancer.  She still has a Scott with whom she is close.  She does not smoke or drink alcoholic beverages.   ROS: All other 10 point review of systems is negative.   PAST MEDICAL HISTORY:   Past Medical History:  Diagnosis Date  . Anemia   . Anxiety   . Arthritis   . Asthma   . Benign positional vertigo   . Cardiac pacemaker in situ 05/01/2015   for syncope & Sx Bradycardia -> Medtronic MRI compatible pacemaker placed Dr. Caryl Comes   . Chronic diarrhea   . Chronic kidney disease, stage IV (severe) ([place])   . Colon polyps   . Diastolic dysfunction, left ventricle 11/03/2013  . Diverticulosis   . Gout   . Hemorrhoids   . Hyperlipidemia   . Hypertensive heart disease    no significant RAS by MRA 2002- (<30% LRAS)   . Hypothyroidism   . IBS (irritable bowel syndrome)   . LBBB (left bundle branch block)- new 11/01/13 11/01/2013  . Paroxysmal atrial fibrillation 11/01/2013   Intermittent through the years and recurrent in December of 2014 treated with amiodarone;; Negative Myoview 10/2013  . Peripheral vascular disease    91-63% LICA,  0-39% RICA by doppler 2009   . Sinus arrest 04/28/2015  . Syncope and collapse 04/28/2015   s/p PPM  . Tubulovillous adenoma 4/07    ALLERGIES: Allergies  Allergen Reactions  . Penicillins Swelling    REACTION: nausea, swelling  . Xifaxan [Rifaximin] Other (See Comments) and Rash    unknown  . Ambien [Zolpidem Tartrate]     " made Lindsey crazy "  . Amoxicillin     REACTION: unspecified  . Ceftin [Cefuroxime Axetil] Diarrhea  . Codeine Phosphate     REACTION: unspecified  . Colchicine     Severe diarrhea  . Lipitor [Atorvastatin] Other (See Comments)    "makes legs jump all night"      MEDICATIONS:  Current Outpatient Prescriptions on File Prior to Visit  Medication Sig Dispense Refill  .  acetaminophen (TYLENOL) 500 MG tablet Take 1.5 tablets (750 mg total) by mouth every 6 (six) hours as needed. 30 tablet 0  . amLODipine (NORVASC) 5 MG tablet Take 1 tablet (5 mg total) by mouth daily. 90 tablet 1  . carvedilol (COREG) 12.5 MG tablet Take 1 tablet (12.5 mg total) by mouth 2 (two) times daily. 60 tablet 16  . CVS VITAMIN B12 1000 MCG tablet TAKE 1 TABLET BY MOUTH EVERY DAY 90 tablet 3  . ELIQUIS 2.5 MG TABS tablet TAKE 1 TABLET BY MOUTH TWICE A DAY 60 tablet 11  . furosemide (LASIX) 40 MG tablet Take 1 tablet (40 mg total) by mouth daily as needed for fluid (more than 2-3 pounds overnight and/or 5 pounds in a week). 30 tablet 3  . hydrALAZINE (APRESOLINE) 50 MG tablet Take 1 tablet (50 mg total) by mouth 3 (three) times daily. 60 tablet 6  . levothyroxine (SYNTHROID, LEVOTHROID) 88 MCG tablet TAKE 1 TABLET (88 MCG TOTAL) BY MOUTH DAILY BEFORE BREAKFAST. 30 tablet 1  . LORazepam (ATIVAN) 0.5 MG tablet TAKE 1 TABLET BY MOUTH EVERY 8 HOURS AS NEEDED 90 tablet 5  . nitroGLYCERIN (NITROSTAT) 0.4 MG SL tablet Place 1 tablet (0.4 mg total) under the tongue every 5 (five) minutes x 3 doses as needed for chest pain. 25 tablet 3  . temazepam (RESTORIL) 30 MG capsule TAKE ONE CAPSULE BY MOUTH EVERY DAY AT BEDTIME AS NEEDED 30 capsule 5  . ULORIC 40 MG tablet Take 1 tablet by mouth daily.     No current facility-administered medications on file prior to visit.      PAST SURGICAL HISTORY Past Surgical History:  Procedure Laterality Date  . APPENDECTOMY    . CATARACT EXTRACTION    . EP IMPLANTABLE DEVICE N/A 05/01/2015   Procedure: Pacemaker Implant;  Surgeon: Deboraha Sprang, MD;  Location: [facility];  Service: Cardiovascular;  Laterality: N/A;  . HIP ARTHROPLASTY Left 05/01/2015   Procedure: ARTHROPLASTY BIPOLAR HIP (HEMIARTHROPLASTY);  Surgeon: Rod Can, MD;  Location: [facility];  Service: Orthopedics;  Laterality: Left;  . LOW ANTERIOR BOWEL RESECTION  7/08  . [place]  [facility]  2000  . NM MYOVIEW LTD  11/02/2013   Negative for ischemia or infarction. EF 60%.  . OOPHORECTOMY  1962   right  . squamous cell skin cancer removed    . TRANSTHORACIC ECHOCARDIOGRAM  10/2013, 02/2017   A) EF 55-60%, mild LVH, elevated bili pressures. Mild aortic valve calcification;; B) Normal LV size and function with mild LVH. Unable to assess diastolic function. Normal PA pressures. Mild MR,Mild AI    FAMILY HISTORY: Family History  Problem Relation Age of Onset  . Cancer Mother 15       unknown  . Heart attack Father   . Heart disease Brother   . Stroke Maternal Grandmother   . Lung cancer Maternal Grandfather   . Stroke Paternal Grandmother   . Colon cancer Scott     SOCIAL HISTORY:  reports that she has never smoked. She has never used smokeless tobacco. She reports that she does not drink alcohol or use drugs.  PERFORMANCE STATUS: The patient's performance status is 1 - Symptomatic but completely ambulatory  PHYSICAL EXAM: Most Recent Vital Signs: Blood pressure 135/68, pulse 75, temperature 97.4 F (36.3 C), temperature source Oral, resp. rate 17, weight 139 lb (63 kg), SpO2 100 %. BP 135/68 (BP Location: Left Arm, Patient Position: Sitting)   Pulse 75   Temp 97.4 F (36.3 C) (Oral)   Resp 17   Wt 139 lb (63 kg)   SpO2 100%   BMI 24.62 kg/m   General Appearance:    Alert, cooperative, no distress, appears stated age  Head:    Normocephalic, without obvious abnormality, atraumatic  Eyes:    PERRL, conjunctiva/corneas clear, EOM's intact, fundi    benign, both eyes        Throat:   Lips, mucosa, and tongue normal; teeth and gums normal  Neck:   Supple, symmetrical, trachea midline, no adenopathy;    thyroid:  no enlargement/tenderness/nodules; no carotid   bruit or JVD  Back:     Symmetric, no curvature, ROM normal, no CVA tenderness  Lungs:     Clear to auscultation bilaterally, respirations unlabored  Chest Wall:    No tenderness or deformity    Heart:    Regular rate and rhythm, S1 and S2 normal, no murmur, rub   or gallop     Abdomen:     Soft, non-tender, bowel sounds active all four quadrants,    no masses, no organomegaly        Extremities:   Extremities normal, atraumatic, no cyanosis or edema  Pulses:   2+ and symmetric all extremities  Skin:   Skin color, texture, turgor normal, no rashes or lesions  Lymph nodes:   Cervical, supraclavicular, and axillary nodes normal  Neurologic:   CNII-XII intact, normal strength, sensation and reflexes    throughout    LABORATORY DATA:  Results for orders placed or performed in visit on 03/20/17 (from the past 48 hour(s))  CBC w/Diff     Status: Abnormal   Collection Time: 03/20/17  1:28 PM  Result Value Ref Range   WBC 2.9 (L) 3.9 - 10.0 10e3/uL   RBC 3.24 (L) 3.70 - 5.32 10e6/uL   HGB 9.8 (L) 11.6 - 15.9 g/dL   HCT 31.0 (L) 34.8 - 46.6 %   MCV 96 81 - 101 fL   MCH 30.2 26.0 - 34.0 pg   MCHC 31.6 (L) 32.0 - 36.0 g/dL   RDW 14.3 11.1 - 15.7 %   Platelets 160 145 - 400 10e3/uL   NEUT# 1.5 1.5 - 6.5 10e3/uL   LYMPH# 1.0 0.9 - 3.3 10e3/uL   MONO# 0.3 0.1 - 0.9 10e3/uL   Eosinophils Absolute 0.0 0.0 - 0.5 10e3/uL   BASO# 0.0 0.0 - 0.2 10e3/uL   NEUT% 52.8 39.6 - 80.0 %   LYMPH% 34.0 14.0 - 48.0 %   MONO% 11.1 0.0 - 13.0 %   EOS% 1.4 0.0 - 7.0 %   BASO% 0.7 0.0 - 2.0 %  LDH     Status: None   Collection Time: 03/20/17  1:28 PM  Result Value Ref Range   LDH 164 125 - 245 U/L  Iron and TIBC     Status: Abnormal   Collection Time: 03/20/17  1:28 PM  Result Value Ref Range   Iron 36 (L) 41 - 142 ug/dL   TIBC 425 236 - 444 ug/dL   UIBC 389 (H) 120 - 384 ug/dL   %SAT 8 (L) 21 - 57 %  Comprehensive metabolic panel     Status: Abnormal   Collection Time: 03/20/17  1:28 PM  Result Value Ref Range   Glucose 95 65 - 99 mg/dL   BUN 39 (H) 10 - 36 mg/dL   Creatinine, Ser 1.44 (H) 0.57 - 1.00 mg/dL   GFR calc non Af Amer 32 (L) >59 mL/min/1.73   GFR calc Af Amer 37 (L) >59  mL/min/1.73   BUN/Creatinine Ratio 27 12 - 28   Sodium 139 134 - 144 mmol/L   Potassium, Ser 4.4 3.5 - 5.2 mmol/L   Chloride, Ser 107 (H) 96 - 106 mmol/L   Carbon Dioxide, Total 23 18 - 29 mmol/L   Calcium, Ser 9.5 8.7 - 10.3 mg/dL   Total Protein 6.7 6.0 - 8.5 g/dL   Albumin, Serum 4.4 3.2 - 4.6 g/dL   Globulin, Total 2.3 1.5 - 4.5 g/dL   Albumin/Globulin Ratio 1.9 1.2 - 2.2   Bilirubin Total 0.3 0.0 - 1.2 mg/dL   Alkaline Phosphatase, S 79 39 - 117 IU/L   AST (SGOT) 25 0 - 40 IU/L   ALT 29 0 - 32 IU/L      RADIOGRAPHY: No results found.     PATHOLOGY: None  ASSESSMENT/PLAN: Ms. Artus is a very pleasant 83 yo caucasian female with history of iron deficiency anemia. She was previously seen by Dr. Marin Olp (2014) for this same issue and is back to re-establish care.  She is symptomatic with fatigue and weakness. Hgb is 9.8 with an iron saturation of 8%.  We will plan to give Lindsey IV iron next week on Monday and again 8 days later. We will then see Lindsey back in 6 week for repeat lab work and follow-up.   All questions were answered. She will contact our office with any questions or concerns. We can certainly see Lindsey much sooner if necessary.  She was discussed with and also seen by Dr. Marin Olp and he is in agreement with the aforementioned.   [facility]    Addendum:  I agree with the above assessment by Judson Roch. I have not seen Ms. Kiernan for several years. She really looks quite good.  We will see about given Lindsey iron. This always works for Lindsey.  Lindsey iron studies show a iron saturation of only 8% with a serum iron of 36. Lindsey last ferritin a week ago was 13. I am pretty confident that the IV iron will help Lindsey.  We spent about 40 minutes with Lindsey. It was nice to see Lindsey again. She really doesn't feel all that bad. Lindsey blood work should improve nicely with the IV iron. It was nice to talk to Lindsey about [name]. We took care of [name] about 13-14 years ago. She had  small cell lung cancer.  Lattie Haw, MD

## 2017-03-21 ENCOUNTER — Other Ambulatory Visit: Payer: Self-pay | Admitting: Cardiology

## 2017-03-21 LAB — ERYTHROPOIETIN: ERYTHROPOIETIN: 13.5 m[IU]/mL (ref 2.6–18.5)

## 2017-03-21 LAB — RETICULOCYTES: Reticulocyte Count: 1.2 % (ref 0.6–2.6)

## 2017-03-24 ENCOUNTER — Telehealth: Payer: Self-pay | Admitting: Gastroenterology

## 2017-03-24 ENCOUNTER — Ambulatory Visit (HOSPITAL_BASED_OUTPATIENT_CLINIC_OR_DEPARTMENT_OTHER): Payer: Medicare Other

## 2017-03-24 ENCOUNTER — Other Ambulatory Visit: Payer: Self-pay | Admitting: Family

## 2017-03-24 VITALS — BP 141/67 | HR 86 | Temp 97.5°F | Resp 17

## 2017-03-24 DIAGNOSIS — D509 Iron deficiency anemia, unspecified: Secondary | ICD-10-CM | POA: Insufficient documentation

## 2017-03-24 DIAGNOSIS — D508 Other iron deficiency anemias: Secondary | ICD-10-CM

## 2017-03-24 MED ORDER — HYOSCYAMINE SULFATE SL 0.125 MG SL SUBL: SUBLINGUAL_TABLET | SUBLINGUAL | 0 refills | Status: DC

## 2017-03-24 MED ORDER — SODIUM CHLORIDE 0.9 % IV SOLN
Freq: Once | INTRAVENOUS | Status: AC
  Administered 2017-03-24: 15:00:00 via INTRAVENOUS

## 2017-03-24 MED ORDER — SODIUM CHLORIDE 0.9 % IV SOLN
510.0000 mg | Freq: Once | INTRAVENOUS | Status: AC
  Administered 2017-03-24: 510 mg via INTRAVENOUS
  Filled 2017-03-24: qty 17

## 2017-03-24 NOTE — Telephone Encounter (Signed)
Patient states she has ran out of the prescription for Levsin. She states the prescription has lasted her so long and she hasn't needed to come into the office. Informed patient to keep her appt in June and I will send her a refill until the appt.

## 2017-03-24 NOTE — Patient Instructions (Signed)

## 2017-04-01 ENCOUNTER — Ambulatory Visit (HOSPITAL_BASED_OUTPATIENT_CLINIC_OR_DEPARTMENT_OTHER): Payer: Medicare Other

## 2017-04-01 VITALS — BP 126/51 | HR 67 | Temp 97.8°F | Resp 17

## 2017-04-01 DIAGNOSIS — D509 Iron deficiency anemia, unspecified: Secondary | ICD-10-CM

## 2017-04-01 DIAGNOSIS — D508 Other iron deficiency anemias: Secondary | ICD-10-CM

## 2017-04-01 MED ORDER — SODIUM CHLORIDE 0.9 % IV SOLN
510.0000 mg | Freq: Once | INTRAVENOUS | Status: AC
  Administered 2017-04-01: 510 mg via INTRAVENOUS
  Filled 2017-04-01: qty 17

## 2017-04-01 MED ORDER — SODIUM CHLORIDE 0.9 % IV SOLN
Freq: Once | INTRAVENOUS | Status: AC
Start: 2017-04-01 — End: 2017-04-01
  Administered 2017-04-01: 13:00:00 via INTRAVENOUS

## 2017-04-01 NOTE — Patient Instructions (Signed)

## 2017-04-11 ENCOUNTER — Telehealth: Payer: Self-pay | Admitting: Cardiology

## 2017-04-11 NOTE — Telephone Encounter (Signed)
Received records from [facility] for appointment on 04/14/17 with [name].  Records put with Dr Allison Quarry schedule on 04/14/17. lp

## 2017-04-14 ENCOUNTER — Encounter: Payer: Self-pay | Admitting: Cardiology

## 2017-04-14 ENCOUNTER — Ambulatory Visit (INDEPENDENT_AMBULATORY_CARE_PROVIDER_SITE_OTHER): Payer: Medicare Other | Admitting: Cardiology

## 2017-04-14 VITALS — BP 122/74 | HR 89 | Ht 63.0 in | Wt 136.4 lb

## 2017-04-14 DIAGNOSIS — I4819 Other persistent atrial fibrillation: Secondary | ICD-10-CM

## 2017-04-14 DIAGNOSIS — I10 Essential (primary) hypertension: Secondary | ICD-10-CM

## 2017-04-14 DIAGNOSIS — I481 Persistent atrial fibrillation: Secondary | ICD-10-CM | POA: Diagnosis not present

## 2017-04-14 DIAGNOSIS — I5032 Chronic diastolic (congestive) heart failure: Secondary | ICD-10-CM

## 2017-04-14 DIAGNOSIS — R0609 Other forms of dyspnea: Secondary | ICD-10-CM

## 2017-04-14 MED ORDER — HYDRALAZINE HCL 50 MG PO TABS: 50.0000 mg | ORAL_TABLET | Freq: Two times a day (BID) | ORAL | 6 refills | Status: DC

## 2017-04-14 NOTE — Patient Instructions (Addendum)
MEDICATION    MAKE SURE YOU ARE TAKING  CARVEDILOL 12.5 MG  ONE TABLET TWICE A DAY  HYDRALAZINE  50 MG ONE TABLET TWICE A DAY  OTHERWISE NO CHANGE WITH CURRENT MEDICATIONS  Your physician recommends that you schedule a follow-up appointment in [place].   Your physician wants you to follow-up in [place]. You will receive a reminder letter in the mail two months in advance. If you don't receive a letter, please call our office to schedule the follow-up appointment.    If you need a refill on your cardiac medications before your next appointment, please call your pharmacy.

## 2017-04-14 NOTE — Progress Notes (Signed)
PCP: [name], MD  Clinic Note: Chief Complaint  Patient presents with  . Follow-up     for shortness of breath; to discuss Myoview  . Edema    pt states some in her feet over the weekend     HPI: [name] is a 83 y.o. female with a PMH below who presents today for follow-up of Myoview stress test. She has a history of chronic atrial fibrillation status post pacemaker placement for syncope with complete heart block in June 2016. At that time she had a syncopal event and fell fracturing her hip.  [name] was last seen on  May 3, and she was still having exertional  Dyspnea with tightness in her chest. She was extremely concerned about  This being anginal equivalent.  This was despite having relatively normal echocardiogram. We therefore did a Myoview for possible exertional angina.  Recent Hospitalizations: None -- has been going to the cancer center for ? Iron/epo infusions   Studies Personally Reviewed - (if available, images/films reviewed: From Epic Chart or Care Everywhere)  Myoview 03/18/2017: EF 55%. No ischemia. LOW RISK  Interval History:  [name] returns today overall doing a bit better. She says that she's been gese infusions from there center and ed her breathing some. She also knows that her nephrologist had her increase her Lasix dosing,  Saying he was concerned that she was volume overloaded. Ever since she diuresed down she got down to the 120s, and was feeling little bit lightheaded -  She's now letting her driveway get up to about 130 pounds at home which is what she was this morning and roughly 132 pounds.)   Her breathing has improved, her swelling has improved. She isn't not having any exertional chest tightness or pressure. No rapid irregular heartbeats or palpitations. No PND, orthopnea, with mild edema improved with increased Lasix.  No syncope/near syncope, or TIA/amaurosis fugax symptoms.  No claudication.  ROS: A comprehensive was  performed. Review of Systems  Constitutional: Negative for malaise/fatigue.  HENT: Negative for congestion.   Respiratory: Positive for shortness of breath (Improved).   Gastrointestinal: Negative for blood in stool and melena.  Genitourinary: Negative for hematuria.  Musculoskeletal: Negative for falls and joint pain.  Neurological: Negative for dizziness and focal weakness.  Psychiatric/Behavioral: Negative for memory loss. The patient is not nervous/anxious and does not have insomnia.   All other systems reviewed and are negative.   I have reviewed and (if needed) personally updated the patient's problem list, medications, allergies, past medical and surgical history, social and family history.   Past Medical History:  Diagnosis Date  . Anemia   . Anxiety   . Arthritis   . Asthma   . Benign positional vertigo   . Cardiac pacemaker in situ 05/01/2015   for syncope & Sx Bradycardia -> Medtronic MRI compatible pacemaker placed Dr. Caryl Comes   . Chronic diarrhea   . Chronic kidney disease, stage IV (severe) ([place])   . Colon polyps   . Diastolic dysfunction, left ventricle 11/03/2013  . Diverticulosis   . Gout   . Hemorrhoids   . Hyperlipidemia   . Hypertensive heart disease    no significant RAS by MRA 2002- (<30% LRAS)   . Hypothyroidism   . IBS (irritable bowel syndrome)   . LBBB (left bundle branch block)- new 11/01/13 11/01/2013  . Paroxysmal atrial fibrillation 11/01/2013   Intermittent through the years and recurrent in December of 2014 treated with amiodarone;;  Negative Myoview 10/2013  . Peripheral vascular disease    70-62% LICA, 3-76% RICA by doppler 2009   . Sinus arrest 04/28/2015  . Syncope and collapse 04/28/2015   s/p PPM  . Tubulovillous adenoma 4/07    Past Surgical History:  Procedure Laterality Date  . APPENDECTOMY    . CATARACT EXTRACTION    . EP IMPLANTABLE DEVICE N/A 05/01/2015   Procedure: Pacemaker Implant;  Surgeon: Deboraha Sprang, MD;  Location: [facility];  Service: Cardiovascular;  Laterality: N/A;  . HIP ARTHROPLASTY Left 05/01/2015   Procedure: ARTHROPLASTY BIPOLAR HIP (HEMIARTHROPLASTY);  Surgeon: Rod Can, MD;  Location: [facility];  Service: Orthopedics;  Laterality: Left;  . LOW ANTERIOR BOWEL RESECTION  7/08  . [place] [facility]GERY  2000  . NM MYOVIEW LTD  03/18/2013   Negative for ischemia or infarction. EF 55%.  LOW RISK  . OOPHORECTOMY  1962   right  . squamous cell skin cancer removed    . TRANSTHORACIC ECHOCARDIOGRAM  10/2013, 02/2017   A) EF 55-60%, mild LVH, elevated bili pressures. Mild aortic valve calcification;; B) Normal LV size and function with mild LVH. Unable to assess diastolic function. Normal PA pressures. Mild MR,Mild AI    Current Meds  Medication Sig  . acetaminophen (TYLENOL) 500 MG tablet Take 1.5 tablets (750 mg total) by mouth every 6 (six) hours as needed.  Marland Kitchen amLODipine (NORVASC) 5 MG tablet TAKE 1 TABLET (5 MG TOTAL) BY MOUTH DAILY.  . carvedilol (COREG) 12.5 MG tablet Take 1 tablet (12.5 mg total) by mouth 2 (two) times daily.  . CVS VITAMIN B12 1000 MCG tablet TAKE 1 TABLET BY MOUTH EVERY DAY  . ELIQUIS 2.5 MG TABS tablet TAKE 1 TABLET BY MOUTH TWICE A DAY  . furosemide (LASIX) 40 MG tablet Take 1 tablet (40 mg total) by mouth daily as needed for fluid (more than 2-3 pounds overnight and/or 5 pounds in a week).  . hydrALAZINE (APRESOLINE) 50 MG tablet Take 1 tablet (50 mg total) by mouth 2 (two) times daily.  Marland Kitchen Hyoscyamine Sulfate SL (LEVSIN/SL) 0.125 MG SUBL Take 1-2 tablets by mouth every 4 hours as needed  . levothyroxine (SYNTHROID, LEVOTHROID) 88 MCG tablet TAKE 1 TABLET (88 MCG TOTAL) BY MOUTH DAILY BEFORE BREAKFAST.  Marland Kitchen LORazepam (ATIVAN) 0.5 MG tablet TAKE 1 TABLET BY MOUTH EVERY 8 HOURS AS NEEDED  . nitroGLYCERIN (NITROSTAT) 0.4 MG SL tablet Place 1 tablet (0.4 mg total) under the tongue every 5 (five) minutes x 3 doses as needed for chest pain.  Marland Kitchen temazepam (RESTORIL) 30 MG  capsule TAKE ONE CAPSULE BY MOUTH EVERY DAY AT BEDTIME AS NEEDED  . ULORIC 40 MG tablet Take 1 tablet by mouth daily.  . [DISCONTINUED] hydrALAZINE (APRESOLINE) 50 MG tablet Take 1 tablet (50 mg total) by mouth 3 (three) times daily.    Allergies  Allergen Reactions  . Penicillins Swelling    REACTION: nausea, swelling  . Xifaxan [Rifaximin] Other (See Comments) and Rash    unknown  . Ambien [Zolpidem Tartrate]     " made her crazy "  . Amoxicillin     REACTION: unspecified  . Ceftin [Cefuroxime Axetil] Diarrhea  . Codeine Phosphate     REACTION: unspecified  . Colchicine     Severe diarrhea  . Lipitor [Atorvastatin] Other (See Comments)    "makes legs jump all night"    Social History   Social History  . Marital status: Widowed  Spouse name: N/A  . Number of children: 2  . Years of education: N/A   Occupational History  . retired    Social History Main Topics  . Smoking status: Never Smoker  . Smokeless tobacco: Never Used     Comment: never used tobacco  . Alcohol use No  . Drug use: No  . Sexual activity: No   Other Topics Concern  . None   Social History Narrative   She is a widowed mother of 2, one child is deceased. Grandmother of one. She appears to be working out routinely at [employer] using [employer]. She is otherwise quite active.   She does not drink alcohol, does not smoke.  She is retired from [employer].    Lives alone in a one story condo.    family history includes Cancer (age of onset: 44) in her mother; Colon cancer in her daughter; Heart attack in her father; Heart disease in her brother; Lung cancer in her maternal grandfather; Stroke in her maternal grandmother and paternal grandmother.  Wt Readings from Last 3 Encounters:  04/14/17 136 lb 6.4 oz (61.9 kg)  03/20/17 139 lb (63 kg)  03/18/17 139 lb (63 kg)    PHYSICAL EXAM BP 122/74   Pulse 89   Ht 5\' 3"  (1.6 m)   Wt 136 lb 6.4 oz (61.9 kg)   SpO2 98%    BMI 24.16 kg/m  General appearance: alert, cooperative, appears stated age, no distress.  Relatively thin,Elderly woman.  Well-groomed HEENT: [place]/AT, EOMI, MMM, anicteric sclera Neck: no adenopathy, no carotid bruit and no JVD Lungs: clear to auscultation bilaterally, normal percussion bilaterally and non-labored Heart: regular rate and rhythm, Normal S1 and split S2.   1/6 SEM at RUSB. No click, rub or gallop;  Nondisplaced PMI Abdomen: soft, non-tender; bowel sounds normal; no masses,  no organomegaly Extremities: extremities normal, atraumatic, no cyanosis,  or edema  Pulses: 2+ and symmetric;  Skin: mobility and turgor normal Neurologic: Mental status: Alert & oriented x 3, thought content appropriate; non-focal exam.  Pleasant mood & affect.   Adult ECG Report n/a  Other studies Reviewed: Additional studies/ records that were reviewed today include:  Recent Labs:  n/a   ASSESSMENT / PLAN: Problem List Items Addressed This Visit    Chronic diastolic heart failure ([place]) (Chronic)     She does have some diastolic dysfunction on echocardiogram and therefore probably has chronic diastolic heart failure symptoms at  Baseline.She is on carvedilol pus hydralazine and amlodipine. Not on ACE inhibitor.  She is on standing dose of Lasix, and we have reiterated the importance of sliding scale usage  For weight gain or edema.      Relevant Medications   hydrALAZINE (APRESOLINE) 50 MG tablet   Dyspnea on exertion - Primary (Chronic)     Now she has  A relatively normal Echocardiogram and Myoview, this suggests probably not cardiac etiology at all.  she did do better after increasing her diuretic dose little bit. I had told her before about sliding scale Lasix, but she had been reluctant to do so until her nephrologist also suggested. She will now maintain a dry weight of roughly 130 pounds      Persistent Paroxysmal atrial fibrillation: CHA2DS2-VASc Score 5, on Eliquis (Chronic)      Persistent A. Fib/ermanent A. Fib.Stable with low-dose ELIQUIS for anticoagulation.Also on stable dose of carvedilol for rate control. No sensation of being in A. Fib. It is  unlikely that this is leading to her dyspnea      Relevant Medications   hydrALAZINE (APRESOLINE) 50 MG tablet   Systolic hypertension, isolated; difficult to control (Chronic)    Stable today now on increased dose of carvedilol and hydralazine.      Relevant Medications   hydrALAZINE (APRESOLINE) 50 MG tablet      Current medicines are reviewed at length with the patient today. (+/- concerns) n/a The following changes have been made: medications refilled  Patient Instructions  MEDICATION    MAKE SURE YOU ARE TAKING  CARVEDILOL 12.5 MG  ONE TABLET TWICE A DAY  HYDRALAZINE  50 MG ONE TABLET TWICE A DAY  OTHERWISE NO CHANGE WITH CURRENT MEDICATIONS  Your physician recommends that you schedule a follow-up appointment in [place].   Your physician wants you to follow-up in [place]. You will receive a reminder letter in the mail two months in advance. If you don't receive a letter, please call our office to schedule the follow-up appointment.    If you need a refill on your cardiac medications before your next appointment, please call your pharmacy.    Studies Ordered:   No orders of the defined types were placed in this encounter.     Glenetta Hew, M.D., M.S. Interventional Cardiologist   Pager # [phone] Phone # [phone] [address]. [place], [place] [address]

## 2017-04-15 ENCOUNTER — Encounter: Payer: Self-pay | Admitting: Cardiology

## 2017-04-15 NOTE — Assessment & Plan Note (Signed)
Now she has  A relatively normal Echocardiogram and Myoview, this suggests probably not cardiac etiology at all.  she did do better after increasing her diuretic dose little bit. I had told her before about sliding scale Lasix, but she had been reluctant to do so until her nephrologist also suggested. She will now maintain a dry weight of roughly 130 pounds

## 2017-04-15 NOTE — Assessment & Plan Note (Signed)
Persistent A. Fib/ermanent A. Fib.Stable with low-dose ELIQUIS for anticoagulation.Also on stable dose of carvedilol for rate control. No sensation of being in A. Fib. It is unlikely that this is leading to her dyspnea

## 2017-04-15 NOTE — Assessment & Plan Note (Signed)
She does have some diastolic dysfunction on echocardiogram and therefore probably has chronic diastolic heart failure symptoms at  Baseline.She is on carvedilol pus hydralazine and amlodipine. Not on ACE inhibitor.  She is on standing dose of Lasix, and we have reiterated the importance of sliding scale usage  For weight gain or edema.

## 2017-04-15 NOTE — Assessment & Plan Note (Signed)
Stable today now on increased dose of carvedilol and hydralazine.

## 2017-04-16 ENCOUNTER — Ambulatory Visit: Payer: Medicare Other | Admitting: Cardiology

## 2017-05-01 ENCOUNTER — Other Ambulatory Visit: Payer: Medicare Other

## 2017-05-01 ENCOUNTER — Ambulatory Visit: Payer: Medicare Other | Admitting: Hematology & Oncology

## 2017-05-01 ENCOUNTER — Ambulatory Visit: Payer: Medicare Other

## 2017-05-02 ENCOUNTER — Other Ambulatory Visit: Payer: Self-pay | Admitting: Family Medicine

## 2017-05-02 NOTE — Telephone Encounter (Signed)
Can we refill this? 

## 2017-05-05 ENCOUNTER — Encounter: Payer: Self-pay | Admitting: Gastroenterology

## 2017-05-05 ENCOUNTER — Ambulatory Visit (INDEPENDENT_AMBULATORY_CARE_PROVIDER_SITE_OTHER): Payer: Medicare Other | Admitting: Gastroenterology

## 2017-05-05 VITALS — BP 120/56 | HR 64 | Ht 63.0 in | Wt 131.2 lb

## 2017-05-05 DIAGNOSIS — K2940 Chronic atrophic gastritis without bleeding: Secondary | ICD-10-CM

## 2017-05-05 DIAGNOSIS — K582 Mixed irritable bowel syndrome: Secondary | ICD-10-CM

## 2017-05-05 DIAGNOSIS — K2100 Gastro-esophageal reflux disease with esophagitis, without bleeding: Secondary | ICD-10-CM

## 2017-05-05 MED ORDER — HYOSCYAMINE SULFATE SL 0.125 MG SL SUBL: SUBLINGUAL_TABLET | SUBLINGUAL | 11 refills | Status: DC

## 2017-05-05 MED ORDER — OMEPRAZOLE 20 MG PO CPDR: 20.0000 mg | DELAYED_RELEASE_CAPSULE | Freq: Every day | ORAL | 11 refills | Status: DC

## 2017-05-05 NOTE — Patient Instructions (Signed)
We have sent the following medications to your pharmacy for you to pick up at your convenience: Levsin and omeprazole.   Thank you for choosing me and [facility].  Pricilla Riffle. Dagoberto Ligas., MD., [name]

## 2017-05-05 NOTE — Progress Notes (Signed)
    History of Present Illness: This is a 83 year old female returning for follow-up of GERD and IBS. She has a history of an esophageal stricture, erosive esophagitis, hiatal hernia with [name] erosions and erosive gastritis. She discontinued her PPI. She states she does not have any active reflux symptoms or dysphagia. Her irritable bowel syndrome with alternating diarrhea and constipation is frequently active. She uses MiraLAX which has not been very helpful. Imodium when necessary has been helpful as has hyoscyamine. She was diagnosed with iron deficiency anemia and has received iron infusions.  Current Medications, Allergies, Past Medical History, Past Surgical History, Family History and Social History were reviewed in [employer] record.  Physical Exam: General: Well developed, well nourished, no acute distress Head: Normocephalic and atraumatic Eyes:  sclerae anicteric, EOMI Ears: Normal auditory acuity Mouth: No deformity or lesions Lungs: Clear throughout to auscultation Heart: Regular rate and rhythm; no murmurs, rubs or bruits Abdomen: Soft, non tender and non distended. No masses, hepatosplenomegaly or hernias noted. Normal Bowel sounds Musculoskeletal: Symmetrical with no gross deformities  Pulses:  Normal pulses noted Extremities: No clubbing, cyanosis, edema or deformities noted Neurological: Alert oriented x 4, grossly nonfocal Psychological:  Alert and cooperative. Normal mood and affect  Assessment and Recommendations:  1. GERD with erosive esophagitis, esophageal stricture, hiatal hernia with [name] erosions and gastric erosions. Chronic blood loss from [name] erosions could be contributing to or the sole cause of her iron deficiency. Although PPI will not heal mechanical erosions related to hiatal hernia the rest of her upper gastrointestinal tract problems are acid related and I recommended that she return to a daily PPI. Begin omeprazole 20 mg  daily. Follow standard antireflux measures.  2. IBS with alternating diarrhea and constipation. MiraLAX qd-bid when necessary, Imodium twice a day when necessary, hyoscyamine 1-2 every 4 hours as sole or by mouth as needed. RE V1 year.

## 2017-05-09 ENCOUNTER — Other Ambulatory Visit (HOSPITAL_BASED_OUTPATIENT_CLINIC_OR_DEPARTMENT_OTHER): Payer: Medicare Other

## 2017-05-09 ENCOUNTER — Ambulatory Visit: Payer: Medicare Other

## 2017-05-09 ENCOUNTER — Ambulatory Visit (HOSPITAL_BASED_OUTPATIENT_CLINIC_OR_DEPARTMENT_OTHER): Payer: Medicare Other | Admitting: Hematology & Oncology

## 2017-05-09 VITALS — BP 133/73 | HR 85 | Temp 97.4°F | Resp 20 | Wt 133.0 lb

## 2017-05-09 DIAGNOSIS — K909 Intestinal malabsorption, unspecified: Secondary | ICD-10-CM | POA: Diagnosis not present

## 2017-05-09 DIAGNOSIS — K922 Gastrointestinal hemorrhage, unspecified: Secondary | ICD-10-CM | POA: Diagnosis not present

## 2017-05-09 DIAGNOSIS — D500 Iron deficiency anemia secondary to blood loss (chronic): Secondary | ICD-10-CM

## 2017-05-09 DIAGNOSIS — D508 Other iron deficiency anemias: Secondary | ICD-10-CM

## 2017-05-09 LAB — CBC WITH DIFFERENTIAL (CANCER CENTER ONLY)
BASO#: 0 10*3/uL (ref 0.0–0.2)
BASO%: 0.6 % (ref 0.0–2.0)
EOS%: 0.3 % (ref 0.0–7.0)
Eosinophils Absolute: 0 10*3/uL (ref 0.0–0.5)
HEMATOCRIT: 37.5 % (ref 34.8–46.6)
HGB: 12 g/dL (ref 11.6–15.9)
LYMPH#: 0.9 10*3/uL (ref 0.9–3.3)
LYMPH%: 25.8 % (ref 14.0–48.0)
MCH: 31.1 pg (ref 26.0–34.0)
MCHC: 32 g/dL (ref 32.0–36.0)
MCV: 97 fL (ref 81–101)
MONO#: 0.5 10*3/uL (ref 0.1–0.9)
MONO%: 13.4 % — ABNORMAL HIGH (ref 0.0–13.0)
NEUT#: 2 10*3/uL (ref 1.5–6.5)
NEUT%: 59.9 % (ref 39.6–80.0)
PLATELETS: 163 10*3/uL (ref 145–400)
RBC: 3.86 10*6/uL (ref 3.70–5.32)
RDW: 16.8 % — AB (ref 11.1–15.7)
WBC: 3.4 10*3/uL — ABNORMAL LOW (ref 3.9–10.0)

## 2017-05-09 NOTE — Progress Notes (Signed)
Hematology and Oncology Follow Up Visit  [name] [id number] 05-15-1924 83 y.o. 05/09/2017   Principle Diagnosis:   Iron deficiency anemia secondary to malabsorption and intermittent GI blood loss  Current Therapy:    IV iron with Candee Furbish given in May 2018     Interim History:  [name] is back for follow-up. This is her second office visit. We first saw her back in May. At that time, she was clearly iron deficient. Her iron saturation was only 8%. Her serum iron was 36. She got a couple doses of Feraheme. This made her feel a lot better.  She discovered back from the beach. She had a good time down the beach.  She is on ELIQUIS. She does have some intermittent GI blood loss.  She's had no fever. She's had no weight loss or weight gain. She's had no headache.  There's been no cough.  Overall, her performance status is ECOG 2.  Medications:  Current Outpatient Prescriptions:  .  acetaminophen (TYLENOL) 500 MG tablet, Take 1.5 tablets (750 mg total) by mouth every 6 (six) hours as needed., Disp: 30 tablet, Rfl: 0 .  amLODipine (NORVASC) 5 MG tablet, TAKE 1 TABLET (5 MG TOTAL) BY MOUTH DAILY., Disp: 90 tablet, Rfl: 3 .  carvedilol (COREG) 12.5 MG tablet, Take 1 tablet (12.5 mg total) by mouth 2 (two) times daily., Disp: 60 tablet, Rfl: 16 .  [employer] VITAMIN B12 1000 MCG tablet, TAKE 1 TABLET BY MOUTH EVERY DAY, Disp: 90 tablet, Rfl: 3 .  ELIQUIS 2.5 MG TABS tablet, TAKE 1 TABLET BY MOUTH TWICE A DAY, Disp: 60 tablet, Rfl: 11 .  furosemide (LASIX) 40 MG tablet, Take 1 tablet (40 mg total) by mouth daily as needed for fluid (more than 2-3 pounds overnight and/or 5 pounds in a week)., Disp: 30 tablet, Rfl: 3 .  hydrALAZINE (APRESOLINE) 50 MG tablet, Take 1 tablet (50 mg total) by mouth 2 (two) times daily., Disp: 60 tablet, Rfl: 6 .  Hyoscyamine Sulfate SL (LEVSIN/SL) 0.125 MG SUBL, Take 1-2 tablets by mouth every 4 hours as needed, Disp: 120 each, Rfl: 11 .  levothyroxine  (SYNTHROID, LEVOTHROID) 88 MCG tablet, TAKE 1 TABLET (88 MCG TOTAL) BY MOUTH DAILY BEFORE BREAKFAST., Disp: 30 tablet, Rfl: 1 .  LORazepam (ATIVAN) 0.5 MG tablet, TAKE 1 TABLET BY MOUTH EVERY 8 HOURS AS NEEDED, Disp: 90 tablet, Rfl: 5 .  nitroGLYCERIN (NITROSTAT) 0.4 MG SL tablet, Place 1 tablet (0.4 mg total) under the tongue every 5 (five) minutes x 3 doses as needed for chest pain., Disp: 25 tablet, Rfl: 3 .  omeprazole (PRILOSEC) 20 MG capsule, Take 1 capsule (20 mg total) by mouth daily., Disp: 30 capsule, Rfl: 11 .  temazepam (RESTORIL) 30 MG capsule, TAKE ONE CAPSULE BY MOUTH EVERY DAY AT BEDTIME AS NEEDED, Disp: 30 capsule, Rfl: 5 .  ULORIC 40 MG tablet, Take 1 tablet by mouth daily., Disp: , Rfl:   Allergies:  Allergies  Allergen Reactions  . Penicillins Swelling    REACTION: nausea, swelling  . Xifaxan [Rifaximin] Other (See Comments) and Rash    unknown  . Ambien [Zolpidem Tartrate]     " made her crazy "  . Amoxicillin     REACTION: unspecified  . Ceftin [Cefuroxime Axetil] Diarrhea  . Codeine Phosphate     REACTION: unspecified  . Colchicine     Severe diarrhea  . Lipitor [Atorvastatin] Other (See Comments)    "makes legs jump all night"  Past Medical History, Surgical history, Social history, and Family History were reviewed and updated.  Review of Systems:  As above  Physical Exam:  weight is 133 lb (60.3 kg). Her oral temperature is 97.4 F (36.3 C). Her blood pressure is 133/73 and her pulse is 85. Her respiration is 20 and oxygen saturation is 97%.   Wt Readings from Last 3 Encounters:  05/09/17 133 lb (60.3 kg)  05/05/17 131 lb 4 oz (59.5 kg)  04/14/17 136 lb 6.4 oz (61.9 kg)      Elderly white female. Head and neck exam shows no ocular or oral lesions. She does have cataract surgery on the right eye. She has no adenopathy in the neck. Thyroid is nonpalpable. Lungs are clear bilaterally. Cardiac exam regular rate and rhythm with no murmurs, rubs or  bruits. Abdomen is soft. She has good bowel sounds. There is no fluid wave. There is no palpable liver or spleen tip. Back exam shows osteoporosis. She has kyphosis. Extremities shows osteoarthritic changes. Skin exam shows numerous solar keratoses. Neurological exam shows no focal neurological deficits.  Lab Results  Component Value Date   WBC 3.4 (L) 05/09/2017   HGB 12.0 05/09/2017   HCT 37.5 05/09/2017   MCV 97 05/09/2017   PLT 163 05/09/2017     Chemistry      Component Value Date/Time   NA 139 03/20/2017 1328   K 4.4 03/20/2017 1328   CL 107 (H) 03/20/2017 1328   CO2 23 03/20/2017 1328   BUN 39 (H) 03/20/2017 1328   CREATININE 1.44 (H) 03/20/2017 1328   CREATININE 1.65 (H) 06/25/2016 1153      Component Value Date/Time   CALCIUM 9.5 03/20/2017 1328   ALKPHOS 79 03/20/2017 1328   AST 25 03/20/2017 1328   ALT 29 03/20/2017 1328   BILITOT 0.3 03/20/2017 1328         Impression and Plan: [name] is a 83 year old white female. She is iron deficiency anemia. She has multifactorial iron deficiency.  It was obvious that she responded to the IV iron. Her hemoglobin went up almost 2-1/2 points. This is no real surprise.  We'll see what her iron studies show. I would be very surprised if she needed IV iron.  We will get her back in 2 months. I would like to get her back actually after Labor Day. I think we can get her back after Labor Day and her blood counts and still maintain itself.   [name], MD 6/29/20181:46 PM

## 2017-05-10 LAB — RETICULOCYTES: RETICULOCYTE COUNT: 1.2 % (ref 0.6–2.6)

## 2017-05-12 ENCOUNTER — Telehealth: Payer: Self-pay | Admitting: *Deleted

## 2017-05-12 LAB — FERRITIN: FERRITIN: 257 ng/mL (ref 9–269)

## 2017-05-12 LAB — IRON AND TIBC
%SAT: 46 % (ref 21–57)
Iron: 123 ug/dL (ref 41–142)
TIBC: 266 ug/dL (ref 236–444)
UIBC: 143 ug/dL (ref 120–384)

## 2017-05-12 NOTE — Telephone Encounter (Addendum)
Message left on personal voice mail  ----- Message from Eliezer Bottom, NP sent at 05/12/2017 10:34 AM EDT ----- Regarding: Iron  Iron studies look great no infusion needed at this time! Thank you!   Sarah  ----- Message ----- From: Interface, Lab In Three Zero One Sent: 05/09/2017   1:02 PM To: Eliezer Bottom, NP

## 2017-05-13 ENCOUNTER — Telehealth: Payer: Self-pay | Admitting: Cardiology

## 2017-05-13 NOTE — Telephone Encounter (Signed)
Reviewed with extender . Patient can take medication from a heart standpoint. Patient aware . Verbalized understanding.

## 2017-05-13 NOTE — Telephone Encounter (Signed)
[name] is calling to find out id she should take Omeprazole the her Gastroenterologist prescribed for  her . Please call

## 2017-05-15 ENCOUNTER — Other Ambulatory Visit: Payer: Self-pay | Admitting: Family Medicine

## 2017-06-09 ENCOUNTER — Telehealth: Payer: Self-pay | Admitting: Cardiology

## 2017-06-09 ENCOUNTER — Ambulatory Visit (INDEPENDENT_AMBULATORY_CARE_PROVIDER_SITE_OTHER): Payer: Medicare Other | Admitting: *Deleted

## 2017-06-09 DIAGNOSIS — I495 Sick sinus syndrome: Secondary | ICD-10-CM | POA: Diagnosis not present

## 2017-06-09 NOTE — Telephone Encounter (Signed)
Spoke with pt and reminded pt of remote transmission that is due today. Pt verbalized understanding.   

## 2017-06-09 NOTE — Progress Notes (Signed)
Remote pacemaker transmission.   

## 2017-06-12 LAB — CUP PACEART REMOTE DEVICE CHECK
Battery Remaining Longevity: 72 mo
Battery Voltage: 3 V
Brady Statistic AP VP Percent: 11.1 %
Brady Statistic AS VS Percent: 3.83 %
Brady Statistic RA Percent Paced: 8.64 %
Brady Statistic RV Percent Paced: 95.99 %
Implantable Lead Location: 753859
Implantable Lead Model: 5076
Implantable Pulse Generator Implant Date: 20160620
Lead Channel Impedance Value: 285 Ohm
Lead Channel Impedance Value: 380 Ohm
Lead Channel Impedance Value: 437 Ohm
Lead Channel Sensing Intrinsic Amplitude: 0.375 mV
Lead Channel Setting Pacing Amplitude: 2 V
Lead Channel Setting Pacing Pulse Width: 0.4 ms
MDC IDC LEAD IMPLANT DT: 20160620
MDC IDC LEAD IMPLANT DT: 20160620
MDC IDC LEAD LOCATION: 753860
MDC IDC MSMT LEADCHNL RA PACING THRESHOLD AMPLITUDE: 0.75 V
MDC IDC MSMT LEADCHNL RA PACING THRESHOLD PULSEWIDTH: 0.4 ms
MDC IDC MSMT LEADCHNL RA SENSING INTR AMPL: 0.375 mV
MDC IDC MSMT LEADCHNL RV IMPEDANCE VALUE: 456 Ohm
MDC IDC MSMT LEADCHNL RV PACING THRESHOLD AMPLITUDE: 0.625 V
MDC IDC MSMT LEADCHNL RV PACING THRESHOLD PULSEWIDTH: 0.4 ms
MDC IDC MSMT LEADCHNL RV SENSING INTR AMPL: 17.375 mV
MDC IDC MSMT LEADCHNL RV SENSING INTR AMPL: 17.375 mV
MDC IDC SESS DTM: 20180730162137
MDC IDC SET LEADCHNL RA PACING AMPLITUDE: 1.5 V
MDC IDC SET LEADCHNL RV SENSING SENSITIVITY: 4 mV
MDC IDC STAT BRADY AP VS PERCENT: 0.08 %
MDC IDC STAT BRADY AS VP PERCENT: 84.99 %

## 2017-06-13 ENCOUNTER — Encounter: Payer: Self-pay | Admitting: Cardiology

## 2017-06-13 NOTE — Progress Notes (Signed)
Letter  

## 2017-06-19 ENCOUNTER — Ambulatory Visit (INDEPENDENT_AMBULATORY_CARE_PROVIDER_SITE_OTHER): Payer: Medicare Other | Admitting: Internal Medicine

## 2017-06-19 ENCOUNTER — Other Ambulatory Visit: Payer: Self-pay | Admitting: Family Medicine

## 2017-06-19 ENCOUNTER — Encounter: Payer: Self-pay | Admitting: Internal Medicine

## 2017-06-19 VITALS — BP 128/68 | HR 88 | Ht 64.0 in | Wt 131.0 lb

## 2017-06-19 DIAGNOSIS — Z950 Presence of cardiac pacemaker: Secondary | ICD-10-CM | POA: Diagnosis not present

## 2017-06-19 DIAGNOSIS — I447 Left bundle-branch block, unspecified: Secondary | ICD-10-CM | POA: Diagnosis not present

## 2017-06-19 DIAGNOSIS — I480 Paroxysmal atrial fibrillation: Secondary | ICD-10-CM

## 2017-06-19 DIAGNOSIS — I495 Sick sinus syndrome: Secondary | ICD-10-CM

## 2017-06-19 LAB — CUP PACEART INCLINIC DEVICE CHECK
Battery Remaining Longevity: 72 mo
Battery Voltage: 3 V
Brady Statistic AP VS Percent: 55.54 %
Brady Statistic AS VS Percent: 13.46 %
Brady Statistic RV Percent Paced: 30.56 %
Implantable Lead Implant Date: 20160620
Implantable Lead Location: 753860
Implantable Lead Model: 5076
Lead Channel Impedance Value: 399 Ohm
Lead Channel Pacing Threshold Amplitude: 0.75 V
Lead Channel Pacing Threshold Pulse Width: 0.4 ms
Lead Channel Setting Pacing Amplitude: 1.5 V
Lead Channel Setting Pacing Pulse Width: 0.4 ms
Lead Channel Setting Sensing Sensitivity: 4 mV
MDC IDC LEAD IMPLANT DT: 20160620
MDC IDC LEAD LOCATION: 753859
MDC IDC MSMT LEADCHNL RA IMPEDANCE VALUE: 285 Ohm
MDC IDC MSMT LEADCHNL RA IMPEDANCE VALUE: 456 Ohm
MDC IDC MSMT LEADCHNL RA SENSING INTR AMPL: 0.8 mV
MDC IDC MSMT LEADCHNL RV IMPEDANCE VALUE: 475 Ohm
MDC IDC PG IMPLANT DT: 20160620
MDC IDC SESS DTM: 20180809160212
MDC IDC SET LEADCHNL RV PACING AMPLITUDE: 2 V
MDC IDC STAT BRADY AP VP PERCENT: 3.95 %
MDC IDC STAT BRADY AS VP PERCENT: 27.04 %
MDC IDC STAT BRADY RA PERCENT PACED: 52.77 %

## 2017-06-19 NOTE — Progress Notes (Signed)
Patient Care Team: [name], MD as PCP - General   HPI  [name] is a 83 y.o. female Seen in follow-up for pacemaker implanted 6/16 for syncope in the context of left bundle branch block sinus arrest and PAF.    The chart was reviewed. She started having problems with dyspnea a few months ago. She was seen in the emergency room on a number of occasions. She was noted in early May to be in atrial fibrillation; it is not clear from the notes that it was noted to be new. She then saw Dr. [name] who identified herself having significant volume overload. She underwent a 50 pound diuresis (12% body weight) with significant improvement and her breathing is now at baseline. Dr. Ellyn Hack increased her carvedilol. This seems to be coincidental with a striking reduction in ventricular response  No peripheral edema.   Major issue is back pain which is limiting  Records and Results Reviewed   Echo EF 55-60 2014 DATE TEST    4/18    Echo   EF 55 % LAE (42/2.5/50)   5/18    Myoview   EF 55-60 % No ischemia        Date Cr Hgb  5/18 1.44 9.8  6/18   12     Past Medical History:  Diagnosis Date  . Anemia   . Anxiety   . Arthritis   . Asthma   . Benign positional vertigo   . Cardiac pacemaker in situ 05/01/2015   for syncope & Sx Bradycardia -> Medtronic MRI compatible pacemaker placed Dr. Caryl Comes   . Chronic diarrhea   . Chronic kidney disease, stage IV (severe) ([place])   . Colon polyps   . Diastolic dysfunction, left ventricle 11/03/2013  . Diverticulosis   . Gout   . Hemorrhoids   . Hyperlipidemia   . Hypertensive heart disease    no significant RAS by MRA 2002- (<30% LRAS)   . Hypothyroidism   . IBS (irritable bowel syndrome)   . LBBB (left bundle branch block)- new 11/01/13 11/01/2013  . Paroxysmal atrial fibrillation 11/01/2013   Intermittent through the years and recurrent in December of 2014 treated with amiodarone;; Negative Myoview 10/2013  . Peripheral  vascular disease    79-89% LICA, 2-11% RICA by doppler 2009   . Sinus arrest 04/28/2015  . Syncope and collapse 04/28/2015   s/p PPM  . Tubulovillous adenoma 4/07    Past Surgical History:  Procedure Laterality Date  . APPENDECTOMY    . CATARACT EXTRACTION    . EP IMPLANTABLE DEVICE N/A 05/01/2015   Procedure: Pacemaker Implant;  Surgeon: Deboraha Sprang, MD;  Location: [facility];  Service: Cardiovascular;  Laterality: N/A;  . HIP ARTHROPLASTY Left 05/01/2015   Procedure: ARTHROPLASTY BIPOLAR HIP (HEMIARTHROPLASTY);  Surgeon: Rod Can, MD;  Location: [facility];  Service: Orthopedics;  Laterality: Left;  . LOW ANTERIOR BOWEL RESECTION  7/08  . [place] SURGERY  2000  . NM MYOVIEW LTD  03/18/2013   Negative for ischemia or infarction. EF 55%.  LOW RISK  . OOPHORECTOMY  1962   right  . squamous cell skin cancer removed    . TRANSTHORACIC ECHOCARDIOGRAM  10/2013, 02/2017   A) EF 55-60%, mild LVH, elevated bili pressures. Mild aortic valve calcification;; B) Normal LV size and function with mild LVH. Unable to assess diastolic function. Normal PA pressures. Mild MR,Mild AI    Current Outpatient Prescriptions  Medication Sig Dispense Refill  . acetaminophen (TYLENOL) 500 MG tablet Take 1.5 tablets (750 mg total) by mouth every 6 (six) hours as needed. 30 tablet 0  . amLODipine (NORVASC) 5 MG tablet TAKE 1 TABLET (5 MG TOTAL) BY MOUTH DAILY. 90 tablet 3  . carvedilol (COREG) 12.5 MG tablet Take 1 tablet (12.5 mg total) by mouth 2 (two) times daily. 60 tablet 16  . CVS VITAMIN B12 1000 MCG tablet TAKE 1 TABLET BY MOUTH EVERY DAY 90 tablet 3  . ELIQUIS 2.5 MG TABS tablet TAKE 1 TABLET BY MOUTH TWICE A DAY 60 tablet 11  . furosemide (LASIX) 40 MG tablet Take 1 tablet (40 mg total) by mouth daily as needed for fluid (more than 2-3 pounds overnight and/or 5 pounds in a week). 30 tablet 3  . hydrALAZINE (APRESOLINE) 50 MG tablet Take 1 tablet (50 mg total) by mouth 2 (two) times daily.  60 tablet 6  . Hyoscyamine Sulfate SL (LEVSIN/SL) 0.125 MG SUBL Take 1-2 tablets by mouth every 4 hours as needed 120 each 11  . levothyroxine (SYNTHROID, LEVOTHROID) 88 MCG tablet TAKE 1 TABLET (88 MCG TOTAL) BY MOUTH DAILY BEFORE BREAKFAST. 30 tablet 11  . LORazepam (ATIVAN) 0.5 MG tablet TAKE 1 TABLET BY MOUTH EVERY 8 HOURS AS NEEDED 90 tablet 5  . nitroGLYCERIN (NITROSTAT) 0.4 MG SL tablet Place 1 tablet (0.4 mg total) under the tongue every 5 (five) minutes x 3 doses as needed for chest pain. 25 tablet 3  . omeprazole (PRILOSEC) 20 MG capsule Take 1 capsule (20 mg total) by mouth daily. 30 capsule 11  . temazepam (RESTORIL) 30 MG capsule TAKE ONE CAPSULE BY MOUTH EVERY DAY AT BEDTIME AS NEEDED 30 capsule 5  . ULORIC 40 MG tablet Take 1 tablet by mouth daily.     No current facility-administered medications for this visit.     Allergies  Allergen Reactions  . Penicillins Swelling    REACTION: nausea, swelling  . Xifaxan [Rifaximin] Other (See Comments) and Rash    unknown  . Ambien [Zolpidem Tartrate]     " made her crazy "  . Amoxicillin     REACTION: unspecified  . Ceftin [Cefuroxime Axetil] Diarrhea  . Codeine Phosphate     REACTION: unspecified  . Colchicine     Severe diarrhea  . Lipitor [Atorvastatin] Other (See Comments)    "makes legs jump all night"      Review of Systems negative except from HPI and PMH  Physical Exam BP 128/68   Pulse 88   Ht 5\' 4"  (1.626 m)   Wt 131 lb (59.4 kg)   SpO2 97%   BMI 22.49 kg/m  Well developed and nourished in no acute distress HENT normalNeck supple with JVP 7 Carotids brisk and full without bruits Clear Regular rate and rhythm, no murmurs or gallops Abd-soft with active BS without hepatomegaly No Clubbing cyanosis edema Skin-warm and dry A & Oriented  Grossly normal sensory and motor function  V pacing has replaced a pacing with left bundle branch block   Assessment and plan  Sinus node dysfunction  Pacemaker-  Medtronic The patient's device was interrogated and the information was fully reviewed.  The device was reprogrammed to  maixmize longevity  Hypertension  Anemia  Atrial tachycardia   atrial fibrillation-long-term persistent   She is in atrial fibrillation and has been since the beginning of March. This is likely contributed to her worsening exercise tolerance but she is now well  rate controlled over the last couple of months and she has recovered baseline functional status.  I've asked her to review with her family whether respiratory status really is at baseline. Cardioversion would be reasonable also Dr. Ellyn Hack has I think accurately identify the fact she is relatively unlikely to maintain sinus rhythm given her large left atrial dimension in the absence of antiarrhythmic therapy.  She is anemic. Her most recent hemoglobin is better. She is to see the PA over [employer] next month Repeat CBC will be in order  She has no RV apically pacing; however, she had baseline has left bundle branch block so the activation sequences are probably not all that different. I think she is at low risk for pacemaker cardiomyopathy.

## 2017-06-19 NOTE — Patient Instructions (Addendum)
Your physician recommends that you continue on your current medications as directed. Please refer to the Current Medication list given to you today.    PACEMAKER CHECK 09-18-17  FROM HOME   Your physician wants you to follow-up in: [place] will receive a reminder letter in the mail two months in advance. If you don't receive a letter, please call our office to schedule the follow-up appointment.

## 2017-06-20 ENCOUNTER — Telehealth: Payer: Self-pay | Admitting: Cardiology

## 2017-06-20 ENCOUNTER — Telehealth: Payer: Self-pay | Admitting: Internal Medicine

## 2017-06-20 NOTE — Telephone Encounter (Signed)
Spoke with pt, she is aware dr harding is out of the office until the 20 th. She has several questions and just wants to know dr harding's option. She has a follow up with APP in September. She would like this forwarded to dr harding.

## 2017-06-20 NOTE — Telephone Encounter (Signed)
New message      Pt request to talk to [name].  She saw [name] yesterday and her heart is out of rhythm again.  She want to get Dr Allison Quarry opinion about having a cardioversion.

## 2017-06-20 NOTE — Telephone Encounter (Signed)
New message     Patient daughter in law would like to go over notes from yesterday, her mother in law is very confused about what was told her about her heart being in AFIB again

## 2017-06-20 NOTE — Telephone Encounter (Signed)
Call in #30 with 5 rf 

## 2017-06-20 NOTE — Telephone Encounter (Signed)
Dr. Caryl Comes called and spoke with the patient's daughter in law regarding his discussion about her a-fib.

## 2017-06-21 NOTE — Telephone Encounter (Signed)
Her heart has been in atrial fibrillation since March. We have known about this and that's why I increased her carvedilol dose. According to Dr. Olin Pia note, her heart rate has been well controlled since we made this adjustment. As a result, the heart is able to work more efficiently and effectively.  Unless she is symptomatic, i.e. her esterase status is just not getting any better, I would not recommend cardioversion. I do not think she would stay out of it. This is also something that Dr. Olin Pia note indicates.  He is the EP doc & I would tend to defer to his judgement re: pro's & cons of DCCV.  Glenetta Hew, MD

## 2017-06-23 NOTE — Telephone Encounter (Signed)
Spoke with pt, aware of dr harding's recommendations. 

## 2017-07-09 ENCOUNTER — Other Ambulatory Visit: Payer: Self-pay | Admitting: Cardiology

## 2017-07-16 ENCOUNTER — Encounter: Payer: Self-pay | Admitting: Cardiology

## 2017-07-16 ENCOUNTER — Ambulatory Visit (INDEPENDENT_AMBULATORY_CARE_PROVIDER_SITE_OTHER): Payer: Medicare Other | Admitting: Cardiology

## 2017-07-16 VITALS — BP 130/72 | HR 97 | Ht 64.0 in | Wt 133.8 lb

## 2017-07-16 DIAGNOSIS — G8929 Other chronic pain: Secondary | ICD-10-CM

## 2017-07-16 DIAGNOSIS — I5032 Chronic diastolic (congestive) heart failure: Secondary | ICD-10-CM | POA: Diagnosis not present

## 2017-07-16 DIAGNOSIS — Z950 Presence of cardiac pacemaker: Secondary | ICD-10-CM | POA: Diagnosis not present

## 2017-07-16 DIAGNOSIS — I4819 Other persistent atrial fibrillation: Secondary | ICD-10-CM

## 2017-07-16 DIAGNOSIS — N184 Chronic kidney disease, stage 4 (severe): Secondary | ICD-10-CM | POA: Diagnosis not present

## 2017-07-16 DIAGNOSIS — M549 Dorsalgia, unspecified: Secondary | ICD-10-CM | POA: Diagnosis not present

## 2017-07-16 DIAGNOSIS — I481 Persistent atrial fibrillation: Secondary | ICD-10-CM

## 2017-07-16 DIAGNOSIS — Z7901 Long term (current) use of anticoagulants: Secondary | ICD-10-CM

## 2017-07-16 NOTE — Progress Notes (Signed)
07/16/2017 [name]   [birth year]  [id number]  Primary Physician [name], MD Primary Cardiologist: Dr Harding/ [name]  HPI:  83 y/o female with a history of PAF. She had been on Amiodarone in the past but had syncope and heart block in June 2016. She also had some retinopathy attributed to Amiodarone and this was stopped.  Her syncope caused a fractured Lt hip. She ultimately underwent MDT pacemaker implant followed by Lt hip surgery 05/01/15. [name] has followed her pacemaker and [name] has been her general cardiologist. Echo in April 2018 showed normal LVF, mild LVH, and severe LAE. Lexiscan in May 2018 was low risk.   She has been having some dyspnea. She saw Dr [name] in June and was noted to be fluid overloaded. He prescribed Lasix and she lost 15 lbs with some improvement in her symptoms. [name] interrogated her pacemaker recently and found her in AF since March 2018. Her rate is controlled and she is back to her baseline activity level. Her activity is limited by significant back problems, she see Dr Nelva Bush. She watches her wgt at home and takes extra Lasix if needed. [name] did not feel an attempt at restoring NSR would be successfully and discussed this with the pt and her family.    Current Outpatient Prescriptions  Medication Sig Dispense Refill  . acetaminophen (TYLENOL) 500 MG tablet Take 1.5 tablets (750 mg total) by mouth every 6 (six) hours as needed. 30 tablet 0  . amLODipine (NORVASC) 5 MG tablet TAKE 1 TABLET (5 MG TOTAL) BY MOUTH DAILY. 90 tablet 3  . carvedilol (COREG) 12.5 MG tablet Take 1 tablet (12.5 mg total) by mouth 2 (two) times daily. 60 tablet 16  . [employer]S VITAMIN B12 1000 MCG tablet TAKE 1 TABLET BY MOUTH EVERY DAY 90 tablet 3  . ELIQUIS 2.5 MG TABS tablet TAKE 1 TABLET BY MOUTH TWICE A DAY 60 tablet 5  . furosemide (LASIX) 40 MG tablet Take 1 tablet (40 mg total) by mouth daily as needed for fluid (more than 2-3 pounds overnight and/or 5  pounds in a week). 30 tablet 3  . hydrALAZINE (APRESOLINE) 50 MG tablet Take 1 tablet (50 mg total) by mouth 2 (two) times daily. 60 tablet 6  . Hyoscyamine Sulfate SL (LEVSIN/SL) 0.125 MG SUBL Take 1-2 tablets by mouth every 4 hours as needed 120 each 11  . levothyroxine (SYNTHROID, LEVOTHROID) 88 MCG tablet TAKE 1 TABLET (88 MCG TOTAL) BY MOUTH DAILY BEFORE BREAKFAST. 30 tablet 11  . LORazepam (ATIVAN) 0.5 MG tablet TAKE 1 TABLET BY MOUTH EVERY 8 HOURS AS NEEDED 90 tablet 5  . nitroGLYCERIN (NITROSTAT) 0.4 MG SL tablet Place 1 tablet (0.4 mg total) under the tongue every 5 (five) minutes x 3 doses as needed for chest pain. 25 tablet 3  . omeprazole (PRILOSEC) 20 MG capsule Take 1 capsule (20 mg total) by mouth daily. 30 capsule 11  . temazepam (RESTORIL) 30 MG capsule TAKE ONE CAPSULE BY MOUTH AT BEDTIME *DRUG NOT COVERED* 30 capsule 5  . ULORIC 40 MG tablet Take 1 tablet by mouth daily.     No current facility-administered medications for this visit.     Allergies  Allergen Reactions  . Penicillins Swelling    REACTION: nausea, swelling  . Xifaxan [Rifaximin] Other (See Comments) and Rash    unknown  . Ambien [Zolpidem Tartrate]     " made her crazy "  . Amoxicillin  REACTION: unspecified  . Ceftin [Cefuroxime Axetil] Diarrhea  . Codeine Phosphate     REACTION: unspecified  . Colchicine     Severe diarrhea  . Lipitor [Atorvastatin] Other (See Comments)    "makes legs jump all night"    Past Medical History:  Diagnosis Date  . Anemia   . Anxiety   . Arthritis   . Asthma   . Benign positional vertigo   . Cardiac pacemaker in situ 05/01/2015   for syncope & Sx Bradycardia -> Medtronic MRI compatible pacemaker placed Dr. Caryl Comes   . Chronic diarrhea   . Chronic kidney disease, stage IV (severe) ([place])   . Colon polyps   . Diastolic dysfunction, left ventricle 11/03/2013  . Diverticulosis   . Gout   . Hemorrhoids   . Hyperlipidemia   . Hypertensive heart disease    no  significant RAS by MRA 2002- (<30% LRAS)   . Hypothyroidism   . IBS (irritable bowel syndrome)   . LBBB (left bundle branch block)- new 11/01/13 11/01/2013  . Paroxysmal atrial fibrillation 11/01/2013   Intermittent through the years and recurrent in December of 2014 treated with amiodarone;; Negative Myoview 10/2013  . Peripheral vascular disease    42-35% LICA, 3-61% RICA by doppler 2009   . Sinus arrest 04/28/2015  . Syncope and collapse 04/28/2015   s/p PPM  . Tubulovillous adenoma 4/07    Social History   Social History  . Marital status: Widowed    Spouse name: N/A  . Number of children: 2  . Years of education: N/A   Occupational History  . retired    Social History Main Topics  . Smoking status: Never Smoker  . Smokeless tobacco: Never Used     Comment: never used tobacco  . Alcohol use No  . Drug use: No  . Sexual activity: No   Other Topics Concern  . Not on file   Social History Narrative   She is a widowed mother of 2, one child is deceased. Grandmother of one. She appears to be working out routinely at [employer] using [employer]. She is otherwise quite active.   She does not drink alcohol, does not smoke.  She is retired from [employer].    Lives alone in a one story condo.     Family History  Problem Relation Age of Onset  . Cancer Mother 34       unknown  . Heart attack Father   . Heart disease Brother   . Stroke Maternal Grandmother   . Lung cancer Maternal Grandfather   . Stroke Paternal Grandmother   . Colon cancer Daughter      Review of Systems: General: negative for chills, fever, night sweats or weight changes.  Cardiovascular: negative for chest pain, dyspnea on exertion, edema, orthopnea, palpitations, paroxysmal nocturnal dyspnea or shortness of breath Dermatological: negative for rash Respiratory: negative for cough or wheezing Urologic: negative for hematuria Abdominal: negative for nausea, vomiting,  diarrhea, bright red blood per rectum, melena, or hematemesis Neurologic: negative for visual changes, syncope, or dizziness All other systems reviewed and are otherwise negative except as noted above.    Blood pressure 130/72, pulse 97, height 5\' 4"  (1.626 m), weight 133 lb 12.8 oz (60.7 kg), SpO2 98 %.  General appearance: alert, cooperative, appears stated age and no distress Neck: no JVD Lungs: clear to auscultation bilaterally and marked scoliosis Heart: regular rate and rhythm Extremities: 1+ dema on Lt (chronic),  trace on Rt Neurologic: Grossly normal   ASSESSMENT AND PLAN:   Chronic diastolic heart failure She appears to be doing pretty well- her wgt is up a couple of pounds and she is following this  Persistent Paroxysmal atrial fibrillation: CHA2DS2-VASc Score 5, on Eliquis Now in AF since March 2018- severe LAE on echo and past Amiodarone intolerance- no plans for cardioversion.  Chronic anticoagulation- Eliquis CHADs VASc = 4-on low dose Eliquis  CKD (chronic kidney disease), stage IV (HCC) Followed by Dr [name]  Cardiac pacemaker in situ MDT PTVDP May 01 2015  Chronic back pain Followed by Dr Nelva Bush- severe scoliosis   PLAN  Same cardiac Rx. [name] had mentioned repeating her labs and I ordered CBC and BMP today. Dr Nelva Bush may want to hold her Eliquis for a back injection and she is cleared from our standpoint to do that. Resume ASAP afterwards. F/U [name] in 6 months.   Kerin Ransom PA-C 07/16/2017 11:45 AM

## 2017-07-16 NOTE — Assessment & Plan Note (Signed)
CHADs VASc = 4-on low dose Eliquis

## 2017-07-16 NOTE — Assessment & Plan Note (Signed)
Now in AF since March 2018- severe LAE on echo and past Amiodarone intolerance- no plans for cardioversion.

## 2017-07-16 NOTE — Patient Instructions (Signed)
Medication Instructions: Your physician recommends that you continue on your current medications as directed. Please refer to the Current Medication list given to you today.  Labwork: Your physician recommends that you return for lab work in: today--BMP, CBC  Follow-Up: Your physician wants you to follow-up in: 6 months with Dr. Ellyn Hack. You will receive a reminder letter in the mail two months in advance. If you don't receive a letter, please call our office to schedule the follow-up appointment.  If you need a refill on your cardiac medications before your next appointment, please call your pharmacy.

## 2017-07-16 NOTE — Assessment & Plan Note (Signed)
Followed by Dr [name]

## 2017-07-16 NOTE — Assessment & Plan Note (Signed)
Followed by Dr Nelva Bush- severe scoliosis

## 2017-07-16 NOTE — Assessment & Plan Note (Signed)
MDT PTVDP May 01 2015

## 2017-07-16 NOTE — Assessment & Plan Note (Signed)
She appears to be doing pretty well- her wgt is up a couple of pounds and she is following this

## 2017-07-17 LAB — CBC
Hematocrit: 36.5 % (ref 34.0–46.6)
Hemoglobin: 11.9 g/dL (ref 11.1–15.9)
MCH: 31.2 pg (ref 26.6–33.0)
MCHC: 32.6 g/dL (ref 31.5–35.7)
MCV: 96 fL (ref 79–97)
Platelets: 169 10*3/uL (ref 150–379)
RBC: 3.81 x10E6/uL (ref 3.77–5.28)
RDW: 15.3 % (ref 12.3–15.4)
WBC: 3.6 10*3/uL (ref 3.4–10.8)

## 2017-07-17 LAB — BASIC METABOLIC PANEL
BUN/Creatinine Ratio: 28 (ref 12–28)
BUN: 47 mg/dL — ABNORMAL HIGH (ref 10–36)
CO2: 31 mmol/L — ABNORMAL HIGH (ref 20–29)
Calcium: 9.1 mg/dL (ref 8.7–10.3)
Chloride: 100 mmol/L (ref 96–106)
Creatinine, Ser: 1.65 mg/dL — ABNORMAL HIGH (ref 0.57–1.00)
GFR calc Af Amer: 31 mL/min/{1.73_m2} — ABNORMAL LOW (ref >59)
GFR calc non Af Amer: 27 mL/min/{1.73_m2} — ABNORMAL LOW (ref >59)
Glucose: 83 mg/dL (ref 65–99)
Potassium: 4.9 mmol/L (ref 3.5–5.2)
Sodium: 143 mmol/L (ref 134–144)

## 2017-07-30 ENCOUNTER — Other Ambulatory Visit (HOSPITAL_BASED_OUTPATIENT_CLINIC_OR_DEPARTMENT_OTHER): Payer: Medicare Other

## 2017-07-30 ENCOUNTER — Ambulatory Visit (HOSPITAL_BASED_OUTPATIENT_CLINIC_OR_DEPARTMENT_OTHER): Payer: Medicare Other | Admitting: Hematology & Oncology

## 2017-07-30 ENCOUNTER — Telehealth: Payer: Self-pay | Admitting: *Deleted

## 2017-07-30 VITALS — BP 133/67 | HR 82 | Temp 97.5°F | Resp 20 | Wt 134.1 lb

## 2017-07-30 DIAGNOSIS — D508 Other iron deficiency anemias: Secondary | ICD-10-CM

## 2017-07-30 DIAGNOSIS — D509 Iron deficiency anemia, unspecified: Secondary | ICD-10-CM

## 2017-07-30 DIAGNOSIS — K922 Gastrointestinal hemorrhage, unspecified: Secondary | ICD-10-CM

## 2017-07-30 DIAGNOSIS — D500 Iron deficiency anemia secondary to blood loss (chronic): Secondary | ICD-10-CM

## 2017-07-30 DIAGNOSIS — K909 Intestinal malabsorption, unspecified: Secondary | ICD-10-CM

## 2017-07-30 LAB — CBC WITH DIFFERENTIAL (CANCER CENTER ONLY)
BASO#: 0 10*3/uL (ref 0.0–0.2)
BASO%: 0.6 % (ref 0.0–2.0)
EOS ABS: 0.1 10*3/uL (ref 0.0–0.5)
EOS%: 3.1 % (ref 0.0–7.0)
HCT: 36.2 % (ref 34.8–46.6)
HGB: 12 g/dL (ref 11.6–15.9)
LYMPH#: 1.4 10*3/uL (ref 0.9–3.3)
LYMPH%: 42.6 % (ref 14.0–48.0)
MCH: 32.6 pg (ref 26.0–34.0)
MCHC: 33.1 g/dL (ref 32.0–36.0)
MCV: 98 fL (ref 81–101)
MONO#: 0.4 10*3/uL (ref 0.1–0.9)
MONO%: 13.5 % — AB (ref 0.0–13.0)
NEUT#: 1.3 10*3/uL — ABNORMAL LOW (ref 1.5–6.5)
NEUT%: 40.2 % (ref 39.6–80.0)
PLATELETS: 158 10*3/uL (ref 145–400)
RBC: 3.68 10*6/uL — ABNORMAL LOW (ref 3.70–5.32)
RDW: 13.5 % (ref 11.1–15.7)
WBC: 3.2 10*3/uL — ABNORMAL LOW (ref 3.9–10.0)

## 2017-07-30 LAB — IRON AND TIBC
%SAT: 41 % (ref 21–57)
Iron: 111 ug/dL (ref 41–142)
TIBC: 273 ug/dL (ref 236–444)
UIBC: 161 ug/dL (ref 120–384)

## 2017-07-30 LAB — FERRITIN: FERRITIN: 174 ng/mL (ref 9–269)

## 2017-07-30 NOTE — Progress Notes (Signed)
Hematology and Oncology Follow Up Visit  [name] [id number] Feb 16, 1924 84 y.o. 07/30/2017   Principle Diagnosis:   Iron deficiency anemia secondary to malabsorption and intermittent GI blood loss  Current Therapy:    IV iron with Candee Furbish given in May 2018     Interim History:  [name] is back for follow-up. She is doing okay. She's had a lot of neck and back issues. She has bad arthritis. I did tell her that she should go see the orthopedist. She has seen Dr. Jeanell Sparrow most before. I really think that he can help her out. He may go ahead and give her an injection or possibly some type of thermal ablation of one of her spinal nerves to try to help the pain.  We will last saw her in June, her ferritin was 257 with iron saturation of 46%. The last time that she got iron was back in May. This clearly has helped.   She's had no bleeding. There's been no change in bowel or bladder habits. She's had no nausea or vomiting.   She has paroxysmal atrial fibrillation. She is on low-dose ELIQUIS for this.  Overall, her performance status is ECOG 2.    Medications:  Current Outpatient Prescriptions:  .  acetaminophen (TYLENOL) 500 MG tablet, Take 1.5 tablets (750 mg total) by mouth every 6 (six) hours as needed., Disp: 30 tablet, Rfl: 0 .  amLODipine (NORVASC) 5 MG tablet, TAKE 1 TABLET (5 MG TOTAL) BY MOUTH DAILY., Disp: 90 tablet, Rfl: 3 .  [employer]S VITAMIN B12 1000 MCG tablet, TAKE 1 TABLET BY MOUTH EVERY DAY, Disp: 90 tablet, Rfl: 3 .  ELIQUIS 2.5 MG TABS tablet, TAKE 1 TABLET BY MOUTH TWICE A DAY, Disp: 60 tablet, Rfl: 5 .  furosemide (LASIX) 40 MG tablet, Take 1 tablet (40 mg total) by mouth daily as needed for fluid (more than 2-3 pounds overnight and/or 5 pounds in a week)., Disp: 30 tablet, Rfl: 3 .  Hyoscyamine Sulfate SL (LEVSIN/SL) 0.125 MG SUBL, Take 1-2 tablets by mouth every 4 hours as needed, Disp: 120 each, Rfl: 11 .  levothyroxine (SYNTHROID, LEVOTHROID) 88 MCG tablet, TAKE  1 TABLET (88 MCG TOTAL) BY MOUTH DAILY BEFORE BREAKFAST., Disp: 30 tablet, Rfl: 11 .  LORazepam (ATIVAN) 0.5 MG tablet, TAKE 1 TABLET BY MOUTH EVERY 8 HOURS AS NEEDED, Disp: 90 tablet, Rfl: 5 .  nitroGLYCERIN (NITROSTAT) 0.4 MG SL tablet, Place 1 tablet (0.4 mg total) under the tongue every 5 (five) minutes x 3 doses as needed for chest pain., Disp: 25 tablet, Rfl: 3 .  omeprazole (PRILOSEC) 20 MG capsule, Take 1 capsule (20 mg total) by mouth daily., Disp: 30 capsule, Rfl: 11 .  temazepam (RESTORIL) 30 MG capsule, TAKE ONE CAPSULE BY MOUTH AT BEDTIME *DRUG NOT COVERED*, Disp: 30 capsule, Rfl: 5 .  ULORIC 40 MG tablet, Take 1 tablet by mouth daily., Disp: , Rfl:  .  carvedilol (COREG) 12.5 MG tablet, Take 1 tablet (12.5 mg total) by mouth 2 (two) times daily., Disp: 60 tablet, Rfl: 16 .  hydrALAZINE (APRESOLINE) 50 MG tablet, Take 1 tablet (50 mg total) by mouth 2 (two) times daily., Disp: 60 tablet, Rfl: 6  Allergies:  Allergies  Allergen Reactions  . Penicillins Swelling    REACTION: nausea, swelling  . Xifaxan [Rifaximin] Other (See Comments) and Rash    unknown  . Ambien [Zolpidem Tartrate]     " made her crazy "  . Amoxicillin  REACTION: unspecified  . Ceftin [Cefuroxime Axetil] Diarrhea  . Codeine Phosphate     REACTION: unspecified  . Colchicine     Severe diarrhea  . Lipitor [Atorvastatin] Other (See Comments)    "makes legs jump all night"    Past Medical History, Surgical history, Social history, and Family History were reviewed and updated.  Review of Systems: As stated in the interim history  Physical Exam:  weight is 134 lb 1.3 oz (60.8 kg). Her temperature is 97.5 F (36.4 C) (abnormal). Her blood pressure is 133/67 and her pulse is 82. Her respiration is 20 and oxygen saturation is 98%.   Wt Readings from Last 3 Encounters:  07/30/17 134 lb 1.3 oz (60.8 kg)  07/16/17 133 lb 12.8 oz (60.7 kg)  06/19/17 131 lb (59.4 kg)      Elderly white female. She has  obvious zoster 30 changes. Head and neck exam shows no adenopathy in the neck. There is no scleral icterus. There is no conjunctival pallor. Lungs are clear. Cardiac exam regular rate and rhythm. I do not detect any obvious atrial fibrillation. Abdomen is soft [name] good bowel sounds. There is no fluid wave. There is no palpable liver or spleen tip. Back exam shows no tenderness over the spine, ribs or hips. She has some slight kyphosis. Extremities shows no clubbing, cyanosis or edema. She has obvious osteophytic changes in her joints. Skin exam shows no rashes, ecchymoses or petechia. Neurological exam shows no focal neurological deficits.   Lab Results  Component Value Date   WBC 3.2 (L) 07/30/2017   HGB 12.0 07/30/2017   HCT 36.2 07/30/2017   MCV 98 07/30/2017   PLT 158 07/30/2017     Chemistry      Component Value Date/Time   NA 143 07/16/2017 1142   K 4.9 07/16/2017 1142   K 4.4 03/20/2017 1328   CL 100 07/16/2017 1142   CL 107 (H) 03/20/2017 1328   CO2 31 (H) 07/16/2017 1142   CO2 23 03/20/2017 1328   BUN 47 (H) 07/16/2017 1142   CREATININE 1.65 (H) 07/16/2017 1142   CREATININE 1.44 (H) 03/20/2017 1328   CREATININE 1.65 (H) 06/25/2016 1153      Component Value Date/Time   CALCIUM 9.1 07/16/2017 1142   CALCIUM 9.5 03/20/2017 1328   ALKPHOS 79 03/20/2017 1328   AST 25 03/20/2017 1328   ALT 29 03/20/2017 1328   BILITOT 0.3 03/20/2017 1328         Impression and Plan: [name] is a 83 year old white female. She is iron deficiency anemia. She has multifactorial iron deficiency.  I'm glad that she is doing well. I will think that her iron studies should be okay.  I'll like to see her back in 2 months. I will like to make sure that her blood count is okay prior to the holidays. I will like to make sure that we get her blood as optimal as possible for the holidays.  [name], MD 9/19/201811:56 AM with

## 2017-07-30 NOTE — Telephone Encounter (Addendum)
Patient is aware of results.   ----- Message from [name], MD sent at 07/30/2017  2:21 PM EDT ----- Call - the iron level is ok!!  [name]

## 2017-07-31 LAB — RETICULOCYTES: Reticulocyte Count: 1.5 % (ref 0.6–2.6)

## 2017-09-01 ENCOUNTER — Ambulatory Visit (INDEPENDENT_AMBULATORY_CARE_PROVIDER_SITE_OTHER): Payer: Medicare Other | Admitting: *Deleted

## 2017-09-01 DIAGNOSIS — Z23 Encounter for immunization: Secondary | ICD-10-CM

## 2017-09-08 ENCOUNTER — Ambulatory Visit (INDEPENDENT_AMBULATORY_CARE_PROVIDER_SITE_OTHER): Payer: Medicare Other | Admitting: *Deleted

## 2017-09-08 ENCOUNTER — Telehealth: Payer: Self-pay | Admitting: *Deleted

## 2017-09-08 DIAGNOSIS — I495 Sick sinus syndrome: Secondary | ICD-10-CM | POA: Diagnosis not present

## 2017-09-08 NOTE — Progress Notes (Signed)
Remote pacemaker transmission.   

## 2017-09-08 NOTE — Telephone Encounter (Signed)
PATIENT OF [facility] Pre-operative Risk Assessment    Request for surgical clearance:  1. What type of PROCEDURE is being performed? LUMBAR ESI  (INJECTION)  2. When is this surgery scheduled? 09/18/2017  3. Are there any medications that need to be held prior to surgery and how long?ELIQUIS 3 DAYS PRIOR  4. Practice name and name of physician performing surgery? [facility]  5. What is your office phone and fax number?  PHONE  [phone] EXT# [phone]   FAX# [phone]  3. Anesthesia type (None, local, MAC, general) ? ---   Trixie Dredge V 09/08/2017, 11:55 AM  _________________________________________________________________   (provider comments below)

## 2017-09-08 NOTE — Telephone Encounter (Signed)
   Chart reviewed as part of pre-operative protocol coverage. Given past medical history and time since last visit, based on ACC/AHA guidelines, [name] would be at acceptable risk for the planned procedure without further cardiovascular testing (see office note from 07/16/17).  She does need her anticoagulation managed.  I will forward to the [facility] for management.   Richardson Dopp, PA-C 09/08/2017, 5:26 PM

## 2017-09-09 LAB — CUP PACEART REMOTE DEVICE CHECK
Battery Remaining Longevity: 70 mo
Battery Voltage: 3 V
Brady Statistic AP VP Percent: 15.29 %
Brady Statistic AP VS Percent: 0.03 %
Brady Statistic AS VS Percent: 1.53 %
Implantable Lead Implant Date: 20160620
Implantable Lead Model: 5076
Implantable Pulse Generator Implant Date: 20160620
Lead Channel Impedance Value: 266 Ohm
Lead Channel Impedance Value: 418 Ohm
Lead Channel Pacing Threshold Amplitude: 0.625 V
Lead Channel Pacing Threshold Amplitude: 0.75 V
Lead Channel Pacing Threshold Pulse Width: 0.4 ms
Lead Channel Sensing Intrinsic Amplitude: 0.625 mV
Lead Channel Sensing Intrinsic Amplitude: 13.875 mV
Lead Channel Setting Pacing Amplitude: 1.5 V
Lead Channel Setting Pacing Amplitude: 2 V
Lead Channel Setting Sensing Sensitivity: 4 mV
MDC IDC LEAD IMPLANT DT: 20160620
MDC IDC LEAD LOCATION: 753859
MDC IDC LEAD LOCATION: 753860
MDC IDC MSMT LEADCHNL RA PACING THRESHOLD PULSEWIDTH: 0.4 ms
MDC IDC MSMT LEADCHNL RA SENSING INTR AMPL: 0.625 mV
MDC IDC MSMT LEADCHNL RV IMPEDANCE VALUE: 399 Ohm
MDC IDC MSMT LEADCHNL RV IMPEDANCE VALUE: 475 Ohm
MDC IDC MSMT LEADCHNL RV SENSING INTR AMPL: 13.875 mV
MDC IDC SESS DTM: 20181029124351
MDC IDC SET LEADCHNL RV PACING PULSEWIDTH: 0.4 ms
MDC IDC STAT BRADY AS VP PERCENT: 83.15 %
MDC IDC STAT BRADY RA PERCENT PACED: 12.16 %
MDC IDC STAT BRADY RV PERCENT PACED: 98.5 %

## 2017-09-09 NOTE — Telephone Encounter (Signed)
Patient with diagnosis of atrial fibrillation on Eliquis for anticoagulation.  Procedure: Lumbar ESI Date of procedure: 09/18/2017  CHADS2-VASc score of  6 (CHF, HTN, AGE, female) with NO history of stroke/TIA noted.   Per office protocol, patient can hold Eliquis for 3 days prior to procedure. Patient will not need bridging with Lovenox (enoxaparin) around procedure.  Patient should restart Eliquis on day after at discretion of procedure MD

## 2017-09-09 NOTE — Telephone Encounter (Signed)
Please forward clearance note.  

## 2017-09-10 ENCOUNTER — Encounter: Payer: Self-pay | Admitting: Family Medicine

## 2017-09-12 ENCOUNTER — Encounter: Payer: Self-pay | Admitting: Family Medicine

## 2017-09-16 ENCOUNTER — Encounter: Payer: Self-pay | Admitting: Cardiology

## 2017-09-17 ENCOUNTER — Encounter: Payer: Self-pay | Admitting: Family Medicine

## 2017-09-30 ENCOUNTER — Ambulatory Visit: Payer: Medicare Other | Admitting: Family

## 2017-09-30 ENCOUNTER — Other Ambulatory Visit: Payer: Medicare Other

## 2017-10-01 ENCOUNTER — Telehealth: Payer: Self-pay | Admitting: Physician Assistant

## 2017-11-15 ENCOUNTER — Emergency Department (HOSPITAL_BASED_OUTPATIENT_CLINIC_OR_DEPARTMENT_OTHER): Payer: Medicare Other

## 2017-11-15 ENCOUNTER — Other Ambulatory Visit: Payer: Self-pay

## 2017-11-15 ENCOUNTER — Encounter (HOSPITAL_BASED_OUTPATIENT_CLINIC_OR_DEPARTMENT_OTHER): Payer: Self-pay | Admitting: Emergency Medicine

## 2017-11-15 ENCOUNTER — Emergency Department (HOSPITAL_BASED_OUTPATIENT_CLINIC_OR_DEPARTMENT_OTHER)
Admission: EM | Admit: 2017-11-15 | Discharge: 2017-11-15 | Disposition: A | Discharge status: Home / Self Care | Payer: Medicare Other | Attending: Emergency Medicine | Admitting: Emergency Medicine

## 2017-11-15 DIAGNOSIS — Z950 Presence of cardiac pacemaker: Secondary | ICD-10-CM | POA: Insufficient documentation

## 2017-11-15 DIAGNOSIS — J45909 Unspecified asthma, uncomplicated: Secondary | ICD-10-CM | POA: Diagnosis not present

## 2017-11-15 DIAGNOSIS — H10023 Other mucopurulent conjunctivitis, bilateral: Secondary | ICD-10-CM | POA: Diagnosis not present

## 2017-11-15 DIAGNOSIS — I130 Hypertensive heart and chronic kidney disease with heart failure and stage 1 through stage 4 chronic kidney disease, or unspecified chronic kidney disease: Secondary | ICD-10-CM | POA: Diagnosis not present

## 2017-11-15 DIAGNOSIS — R070 Pain in throat: Secondary | ICD-10-CM | POA: Diagnosis not present

## 2017-11-15 DIAGNOSIS — E039 Hypothyroidism, unspecified: Secondary | ICD-10-CM | POA: Diagnosis not present

## 2017-11-15 DIAGNOSIS — N184 Chronic kidney disease, stage 4 (severe): Secondary | ICD-10-CM | POA: Diagnosis not present

## 2017-11-15 DIAGNOSIS — J3489 Other specified disorders of nose and nasal sinuses: Secondary | ICD-10-CM | POA: Insufficient documentation

## 2017-11-15 DIAGNOSIS — Z79899 Other long term (current) drug therapy: Secondary | ICD-10-CM | POA: Diagnosis not present

## 2017-11-15 DIAGNOSIS — Z7901 Long term (current) use of anticoagulants: Secondary | ICD-10-CM | POA: Diagnosis not present

## 2017-11-15 DIAGNOSIS — R05 Cough: Secondary | ICD-10-CM | POA: Insufficient documentation

## 2017-11-15 DIAGNOSIS — R059 Cough, unspecified: Secondary | ICD-10-CM

## 2017-11-15 DIAGNOSIS — Z96642 Presence of left artificial hip joint: Secondary | ICD-10-CM | POA: Diagnosis not present

## 2017-11-15 DIAGNOSIS — I5032 Chronic diastolic (congestive) heart failure: Secondary | ICD-10-CM | POA: Diagnosis not present

## 2017-11-15 MED ORDER — BENZONATATE 100 MG PO CAPS: 100.0000 mg | ORAL_CAPSULE | Freq: Three times a day (TID) | ORAL | 0 refills | Status: DC

## 2017-11-15 NOTE — ED Triage Notes (Signed)
Cough since Thursday.   

## 2017-11-15 NOTE — Discharge Instructions (Signed)
Your chest x-ray did not show any signs of pneumonia.  Please take your medications as prescribed.  Have given you Tessalon for cough.  May use Mucinex over-the-counter for chest decongestion.  Continue your Allegra and use nasal saline sprays.  Follow-up with your primary care doctor and your cardiologist.  Return to the ED with any worsening symptoms.

## 2017-11-15 NOTE — ED Provider Notes (Signed)
[facility] EMERGENCY DEPARTMENT Provider Note   CSN: [id number] Arrival date & time: 11/15/17  1101     History   Chief Complaint Chief Complaint  Patient presents with  . Cough    HPI [name] is a 84 y.o. female.  HPI A 84 year old Caucasian female past medical history significant for hypertension, PAF currently anticoagulated, asthma, CHF, PVD that presents to the emergency department today for evaluation of eye drainage, rhinorrhea, scratchy throat and productive cough.  The patient states that approximately 1 week ago she started developing some clear bilateral eye drainage and clear rhinorrhea. She thought it was allergies and started taking allegra which is helped slightly. Two days ago she started to develop a productive cough of yellow green sputum which resolved then came back last night. Pt denies any associated fevers, chills, sob, cp, palpitations.  Patient is clear that she may have pneumonia.  Has no sick contacts.  Patient taking her Eliquis as prescribed.  She does report some lower extremity edema that is baseline for patient and no new increased swelling or calf tenderness. Nothing makes her symptoms better or worse.   Pt denies any fever, chill, ha, vision changes, lightheadedness, dizziness, congestion, neck pain, cp, sob, cough, abd pain, n/v/d, urinary symptoms, change in bowel habits, melena, hematochezia, lower extremity paresthesias.  Past Medical History:  Diagnosis Date  . Anemia   . Anxiety   . Arthritis   . Asthma   . Benign positional vertigo   . Cardiac pacemaker in situ 05/01/2015   for syncope & Sx Bradycardia -> Medtronic MRI compatible pacemaker placed Dr. Caryl Comes   . Chronic diarrhea   . Chronic kidney disease, stage IV (severe) ([place])   . Colon polyps   . Diastolic dysfunction, left ventricle 11/03/2013  . Diverticulosis   . Gout   . Hemorrhoids   . Hyperlipidemia   . Hypertensive heart disease    no significant RAS by MRA  2002- (<30% LRAS)   . Hypothyroidism   . IBS (irritable bowel syndrome)   . LBBB (left bundle branch block)- new 11/01/13 11/01/2013  . Paroxysmal atrial fibrillation 11/01/2013   Intermittent through the years and recurrent in December of 2014 treated with amiodarone;; Negative Myoview 10/2013  . Peripheral vascular disease    15-37% LICA, 9-43% RICA by doppler 2009   . Sinus arrest 04/28/2015  . Syncope and collapse 04/28/2015   s/p PPM  . Tubulovillous adenoma 4/07    Patient Active Problem List   Diagnosis Date Noted  . Chronic back pain 07/16/2017  . Iron deficiency anemia 03/24/2017  . IDA (iron deficiency anemia) 03/20/2017  . Dyspnea on exertion 02/21/2017  . Insomnia, persistent 07/26/2015  . Cardiac pacemaker in situ   . Femoral neck fracture ([place]) 05/01/2015  . Right bundle branch block (RBBB) intermittent  04/28/2015  . Closed left hip fracture ([place])   . Fall   . Syncope, cardiogenic   . Abnormal liver function tests 11/10/2014  . Systolic hypertension, isolated; difficult to control 10/04/2014  . CKD (chronic kidney disease), stage IV ([place]) 07/31/2014  . Gout 06/17/2014  . Edema of both legs 02/09/2014  . Encounter for long-term (current) use of other medications 12/25/2013  . Hypertensive heart/kidney disease w/chronic kidney disease stage IV ([place]) 12/25/2013  . Chest pain 12/03/2013  . Chronic diastolic heart failure ([place]) 12/03/2013  . H/O tachycardia-bradycardia syndrome   . Chronic anticoagulation- Eliquis 11/17/2013  . LBBB (left bundle branch block)- new 11/01/13 11/01/2013  .  Persistent Paroxysmal atrial fibrillation: CHA2DS2-VASc Score 5, on Eliquis 11/01/2013  . Peripheral vascular disease   . Benign positional vertigo   . Irritable bowel syndrome 03/22/2009  . Anxiety   . Reactive airway disease 01/13/2008  . Chronic anemia   . Osteoarthritis   . Hypothyroidism   . Hyperlipidemia   . Hypertensive heart disease     Past Surgical History:    Procedure Laterality Date  . APPENDECTOMY    . CATARACT EXTRACTION    . EP IMPLANTABLE DEVICE N/A 05/01/2015   Procedure: Pacemaker Implant;  Surgeon: Deboraha Sprang, MD;  Location: [facility];  Service: Cardiovascular;  Laterality: N/A;  . HIP ARTHROPLASTY Left 05/01/2015   Procedure: ARTHROPLASTY BIPOLAR HIP (HEMIARTHROPLASTY);  Surgeon: Rod Can, MD;  Location: [facility];  Service: Orthopedics;  Laterality: Left;  . LOW ANTERIOR BOWEL RESECTION  7/08  . [place] SURGERY  2000  . NM MYOVIEW LTD  03/18/2013   Negative for ischemia or infarction. EF 55%.  LOW RISK  . OOPHORECTOMY  1962   right  . squamous cell skin cancer removed    . TRANSTHORACIC ECHOCARDIOGRAM  10/2013, 02/2017   A) EF 55-60%, mild LVH, elevated bili pressures. Mild aortic valve calcification;; B) Normal LV size and function with mild LVH. Unable to assess diastolic function. Normal PA pressures. Mild MR,Mild AI    OB History    No data available       Home Medications    Prior to Admission medications   Medication Sig Start Date End Date Taking? Authorizing Provider  acetaminophen (TYLENOL) 500 MG tablet Take 1.5 tablets (750 mg total) by mouth every 6 (six) hours as needed. 01/11/17   Mackuen, Courteney Lyn, MD  amLODipine (NORVASC) 5 MG tablet TAKE 1 TABLET (5 MG TOTAL) BY MOUTH DAILY. 03/21/17   [name], MD  carvedilol (COREG) 12.5 MG tablet Take 1 tablet (12.5 mg total) by mouth 2 (two) times daily. 03/13/17 07/16/17  [name], MD  [facility] VITAMIN B12 1000 MCG tablet TAKE 1 TABLET BY MOUTH EVERY DAY 05/02/17   [name], MD  ELIQUIS 2.5 MG TABS tablet TAKE 1 TABLET BY MOUTH TWICE A DAY 07/09/17   [name], MD  furosemide (LASIX) 40 MG tablet Take 1 tablet (40 mg total) by mouth daily as needed for fluid (more than 2-3 pounds overnight and/or 5 pounds in a week). 01/27/17   [name], MD  hydrALAZINE (APRESOLINE) 50 MG tablet Take 1 tablet (50 mg total) by mouth 2 (two)  times daily. 04/14/17 07/16/17  [name], MD  Hyoscyamine Sulfate SL (LEVSIN/SL) 0.125 MG SUBL Take 1-2 tablets by mouth every 4 hours as needed 05/05/17   Ladene Artist, MD  levothyroxine (SYNTHROID, LEVOTHROID) 88 MCG tablet TAKE 1 TABLET (88 MCG TOTAL) BY MOUTH DAILY BEFORE BREAKFAST. 05/16/17   [name], MD  LORazepam (ATIVAN) 0.5 MG tablet TAKE 1 TABLET BY MOUTH EVERY 8 HOURS AS NEEDED 02/26/17   [name], MD  nitroGLYCERIN (NITROSTAT) 0.4 MG SL tablet Place 1 tablet (0.4 mg total) under the tongue every 5 (five) minutes x 3 doses as needed for chest pain. 02/27/16   [name], MD  omeprazole (PRILOSEC) 20 MG capsule Take 1 capsule (20 mg total) by mouth daily. 05/05/17   Ladene Artist, MD  temazepam (RESTORIL) 30 MG capsule TAKE ONE CAPSULE BY MOUTH AT BEDTIME *DRUG NOT COVERED* 06/20/17   Sarajane Jews,  Ishmael Holter, MD  ULORIC 40 MG tablet Take 1 tablet by mouth daily. 03/12/16   [provider]    Family History Family History  Problem Relation Age of Onset  . Cancer Mother 53       unknown  . Heart attack Father   . Heart disease Brother   . Stroke Maternal Grandmother   . Lung cancer Maternal Grandfather   . Stroke Paternal Grandmother   . Colon cancer Daughter     Social History Social History   Tobacco Use  . Smoking status: Never Smoker  . Smokeless tobacco: Never Used  . Tobacco comment: never used tobacco  Substance Use Topics  . Alcohol use: No    Alcohol/week: 0.0 oz  . Drug use: No     Allergies   Penicillins; Xifaxan [rifaximin]; Ambien [zolpidem tartrate]; Amoxicillin; Ceftin [cefuroxime axetil]; Codeine phosphate; Colchicine; and Lipitor [atorvastatin]   Review of Systems Review of Systems  Constitutional: Negative for chills and fever.  HENT: Positive for congestion, rhinorrhea, sinus pressure and sneezing.   Eyes: Negative for visual disturbance.  Respiratory: Positive for cough. Negative for shortness of breath.   Cardiovascular:  Negative for chest pain.  Gastrointestinal: Negative for abdominal pain, diarrhea, nausea and vomiting.  Genitourinary: Negative for dysuria, flank pain, frequency, hematuria, urgency, vaginal bleeding and vaginal discharge.  Musculoskeletal: Negative for arthralgias and myalgias.  Skin: Negative for rash.  Neurological: Negative for dizziness, syncope, weakness, light-headedness, numbness and headaches.  Psychiatric/Behavioral: Negative for sleep disturbance. The patient is not nervous/anxious.      Physical Exam Updated Vital Signs BP (!) 174/86 (BP Location: Right Arm)   Pulse 78   Temp 98.3 F (36.8 C) (Oral)   Resp 18   SpO2 98%   Physical Exam  Constitutional: She is oriented to person, place, and time. She appears well-developed and well-nourished.  Non-toxic appearance. No distress.  HENT:  Head: Normocephalic and atraumatic.  Nose: Rhinorrhea present.  Mouth/Throat: Uvula is midline, oropharynx is clear and moist and mucous membranes are normal. No trismus in the jaw. No uvula swelling.  Eyes: Conjunctivae are normal. Pupils are equal, round, and reactive to light. Right eye exhibits no discharge. Left eye exhibits no discharge.  Clear date drainage from bilateral eyes.  Neck: Normal range of motion. Neck supple.  No c spine midline tenderness. No paraspinal tenderness. No deformities or step offs noted. Full ROM. Supple. No nuchal rigidity.    Cardiovascular: Normal rate, regular rhythm, normal heart sounds and intact distal pulses. Exam reveals no gallop and no friction rub.  No murmur heard. Pulmonary/Chest: Effort normal and breath sounds normal. No stridor. No respiratory distress. She has no wheezes. She has no rales. She exhibits no tenderness.  Musculoskeletal: Normal range of motion. She exhibits no tenderness.  No lower extremity edema or calf tenderness.  Lymphadenopathy:    She has no cervical adenopathy.  Neurological: She is alert and oriented to person,  place, and time.  Skin: Skin is warm and dry. Capillary refill takes less than 2 seconds.  Psychiatric: Her behavior is normal. Judgment and thought content normal.  Nursing note and vitals reviewed.    ED Treatments / Results  Labs (all labs ordered are listed, but only abnormal results are displayed) Labs Reviewed - No data to display  EKG  EKG Interpretation None       Radiology Dg Chest 2 View  Result Date: 11/15/2017 CLINICAL DATA:  Cough and congestion. EXAM: CHEST  2 VIEW  COMPARISON:  Chest radiograph 03/02/2017. FINDINGS: Multi lead pacer apparatus overlies the left hemithorax. Stable cardiomegaly. Tortuosity and calcification of the thoracic aorta. No consolidative pulmonary opacities. No pleural effusion or pneumothorax. Small hiatal hernia. Thoracic spine degenerative changes. IMPRESSION: No acute cardiopulmonary process. Electronically Signed   By: Lovey Newcomer M.D.   On: 11/15/2017 11:41    Procedures Procedures (including critical care time)  Medications Ordered in ED Medications - No data to display   Initial Impression / Assessment and Plan / ED Course  I have reviewed the triage vital signs and the nursing notes.  Pertinent labs & imaging results that were available during my care of the patient were reviewed by me and considered in my medical decision making (see chart for details).     Patient presents to the ED for evaluation of rhinorrhea, eye drainage, sneezing, productive cough.  Ongoing for 3 days.  Patient tried over-the-counter Allegra with only little relief.  Concerned that she may have pneumonia.  On exam patient is overall well-appearing and nontoxic.  Vital signs are very reassuring.  She is afebrile, no hypoxia, no tachycardia, no hypotension noted.  Lungs clear to auscultation bilaterally.  She does have some nasal drainage no signs of deep neck infection or meningitis signs.  X-ray returned that shows no focal infiltrate and this shows no  acute abnormalities.  Chest pain or shortness of breath.  She is on anticoagulation and takes it regularly.  Clinical presentation does not seem consistent with PE, ACS, dissection, pneumonia.  Patient symptoms likely due to allergies versus viral URI with cough.  Instructed patient to take Mucinex for decongestant along with nasal saline sprays her Allegra and will give her Tessalon for cough.  She does follow with her cardiologist next week.  Do not feel any further imaging or testing is indicated at this time.  Pt is hemodynamically stable, in NAD, & able to ambulate in the ED. Evaluation does not show pathology that would require ongoing emergent intervention or inpatient treatment. I explained the diagnosis to the patient. Pain has been managed & has no complaints prior to dc. Pt is comfortable with above plan and is stable for discharge at this time. All questions were answered prior to disposition. Strict return precautions for f/u to the ED were discussed. Encouraged follow up with PCP.  Pt seen and eval by Dr. Maryan Rued who is agreeable with the above plan.   Final Clinical Impressions(s) / ED Diagnoses   Final diagnoses:  Cough    ED Discharge Orders        Ordered    benzonatate (TESSALON) 100 MG capsule  Every 8 hours     11/15/17 1401       Doristine Devoid, PA-C 11/15/17 1402    Blanchie Dessert, MD 11/15/17 2157

## 2017-11-18 ENCOUNTER — Ambulatory Visit: Payer: Medicare Other | Admitting: Family Medicine

## 2017-11-18 ENCOUNTER — Encounter: Payer: Self-pay | Admitting: Family Medicine

## 2017-11-18 VITALS — BP 138/60 | HR 88 | Temp 97.5°F | Ht 62.0 in | Wt 133.0 lb

## 2017-11-18 DIAGNOSIS — N3281 Overactive bladder: Secondary | ICD-10-CM | POA: Diagnosis not present

## 2017-11-18 DIAGNOSIS — J209 Acute bronchitis, unspecified: Secondary | ICD-10-CM

## 2017-11-18 MED ORDER — HYDROCODONE-HOMATROPINE 5-1.5 MG/5ML PO SYRP: 5.0000 mL | ORAL_SOLUTION | ORAL | 0 refills | Status: DC | PRN

## 2017-11-18 MED ORDER — CLARITHROMYCIN 500 MG PO TABS: 500.0000 mg | ORAL_TABLET | Freq: Two times a day (BID) | ORAL | 0 refills | Status: DC

## 2017-11-18 NOTE — Progress Notes (Signed)
   Subjective:    Patient ID: [name], female    DOB: Feb 19, 1924, 84 y.o.   MRN: [id number]  HPI Here to follow up an ER visit on 11-15-17 for coughing. For one week prior to that she had a stuffy head, PND, and a dry cough. No SOB or fever. A CXR that day was clear. She was felt to have a viral URI and was given Benzonatate for this. These have not helped, and now the cough produces green sputum. She is drinking fluids but her appetite is poor. Also she continues to have trouble with bladder leakage and she wants to see a urologist. She has seen Dr. Karsten Ro in the past.   Review of Systems  Constitutional: Negative.   HENT: Positive for congestion and postnasal drip. Negative for sinus pressure, sinus pain and sore throat.   Eyes: Negative.   Respiratory: Positive for cough and chest tightness. Negative for shortness of breath.   Cardiovascular: Negative.   Genitourinary: Positive for urgency. Negative for dysuria.       Objective:   Physical Exam  Constitutional: She appears well-developed and well-nourished.  HENT:  Right Ear: External ear normal.  Left Ear: External ear normal.  Nose: Nose normal.  Mouth/Throat: Oropharynx is clear and moist.  Eyes: Conjunctivae are normal.  Neck: No thyromegaly present.  Cardiovascular: Normal rate, regular rhythm, normal heart sounds and intact distal pulses.  Pulmonary/Chest: Effort normal. No respiratory distress. She has no wheezes. She has no rales.  Scattered rhonchi   Lymphadenopathy:    She has no cervical adenopathy.          Assessment & Plan:  Bronchitis, treat with Biaxin. Refer to Urology for OAB.  [name], MD

## 2017-11-19 ENCOUNTER — Telehealth: Payer: Self-pay | Admitting: Family Medicine

## 2017-11-19 NOTE — Telephone Encounter (Signed)
Copied from [place] [id number]. Topic: Quick Communication - See Telephone Encounter >> Nov 19, 2017  3:19 PM Percell Belt A wrote: CRM for notification. See Telephone encounter for: pt called in and said that the  clarithromycin (BIAXIN) 500 MG tablet [[id number]]  is not working.  It is causing her to have really bad diarrhea.  She would like to know if there is anything else she can take?    Pharmacy - [facility] on [address]    11/19/17.

## 2017-11-19 NOTE — Telephone Encounter (Signed)
Sent to PCP ?

## 2017-11-19 NOTE — Telephone Encounter (Signed)
Call in Doxycycline 100 mg bid for 10 days  

## 2017-11-20 MED ORDER — DOXYCYCLINE HYCLATE 100 MG PO TABS: 100.0000 mg | ORAL_TABLET | Freq: Two times a day (BID) | ORAL | 0 refills | Status: DC

## 2017-11-20 NOTE — Telephone Encounter (Signed)
Rx has been sent. Called pt. Pt advised and voiced understanding.

## 2017-12-01 ENCOUNTER — Ambulatory Visit: Payer: Self-pay

## 2017-12-01 MED ORDER — CLARITHROMYCIN 500 MG PO TABS: 500.0000 mg | ORAL_TABLET | Freq: Two times a day (BID) | ORAL | 0 refills | Status: DC

## 2017-12-01 NOTE — Telephone Encounter (Signed)
Sent in the medication

## 2017-12-01 NOTE — Telephone Encounter (Signed)
Pt advised Rx was sent.

## 2017-12-01 NOTE — Addendum Note (Signed)
Addended by: Myriam Forehand on: 12/01/2017 05:11 PM   Modules accepted: Orders

## 2017-12-01 NOTE — Telephone Encounter (Signed)
   Reason for Disposition . Cough has been present for > 3 weeks  Answer Assessment - Initial Assessment Questions 1. ONSET: "When did the cough begin?"      This weekend 2. SEVERITY: "How bad is the cough today?"      Severe 3. RESPIRATORY DISTRESS: "Describe your breathing."      No problem 4. FEVER: "Do you have a fever?" If so, ask: "What is your temperature, how was it measured, and when did it start?"     No 5. SPUTUM: "Describe the color of your sputum" (clear, white, yellow, green)     Clear 6. HEMOPTYSIS: "Are you coughing up any blood?" If so ask: "How much?" (flecks, streaks, tablespoons, etc.)     No 7. CARDIAC HISTORY: "Do you have any history of heart disease?" (e.g., heart attack, congestive heart failure)      Pacemaker, high blood pressure 8. LUNG HISTORY: "Do you have any history of lung disease?"  (e.g., pulmonary embolus, asthma, emphysema)     No 9. PE RISK FACTORS: "Do you have a history of blood clots?" (or: recent major surgery, recent prolonged travel, bedridden )     No 10. OTHER SYMPTOMS: "Do you have any other symptoms?" (e.g., runny nose, wheezing, chest pain)       Runny nose 11. PREGNANCY: "Is there any chance you are pregnant?" "When was your last menstrual period?"       No 12. TRAVEL: "Have you traveled out of the country in the last month?" (e.g., travel history, exposures)       No  Protocols used: COUGH - ACUTE PRODUCTIVE-A-AH  Couldn't sleep from coughing.

## 2017-12-01 NOTE — Telephone Encounter (Signed)
Call in another 10 days of Biaxin 500 mg bid

## 2017-12-01 NOTE — Telephone Encounter (Signed)
Sent to PCP for approval. Last seen 11/18/2016 for acute bronchitis

## 2017-12-01 NOTE — Addendum Note (Signed)
Addended by: Myriam Forehand on: 12/01/2017 05:14 PM   Modules accepted: Orders

## 2017-12-02 ENCOUNTER — Encounter (HOSPITAL_COMMUNITY): Payer: Self-pay | Admitting: Nurse Practitioner

## 2017-12-02 ENCOUNTER — Observation Stay (HOSPITAL_COMMUNITY)
Admission: EM | Admit: 2017-12-02 | Discharge: 2017-12-04 | Disposition: A | Discharge status: Home / Self Care | Payer: Medicare Other | Attending: Internal Medicine | Admitting: Internal Medicine

## 2017-12-02 ENCOUNTER — Emergency Department (HOSPITAL_COMMUNITY): Payer: Medicare Other

## 2017-12-02 DIAGNOSIS — D61818 Other pancytopenia: Secondary | ICD-10-CM | POA: Diagnosis not present

## 2017-12-02 DIAGNOSIS — K589 Irritable bowel syndrome without diarrhea: Secondary | ICD-10-CM | POA: Diagnosis not present

## 2017-12-02 DIAGNOSIS — N184 Chronic kidney disease, stage 4 (severe): Secondary | ICD-10-CM | POA: Insufficient documentation

## 2017-12-02 DIAGNOSIS — I509 Heart failure, unspecified: Secondary | ICD-10-CM

## 2017-12-02 DIAGNOSIS — Z888 Allergy status to other drugs, medicaments and biological substances status: Secondary | ICD-10-CM | POA: Insufficient documentation

## 2017-12-02 DIAGNOSIS — R0603 Acute respiratory distress: Secondary | ICD-10-CM | POA: Insufficient documentation

## 2017-12-02 DIAGNOSIS — I119 Hypertensive heart disease without heart failure: Secondary | ICD-10-CM | POA: Diagnosis present

## 2017-12-02 DIAGNOSIS — Z79899 Other long term (current) drug therapy: Secondary | ICD-10-CM | POA: Diagnosis not present

## 2017-12-02 DIAGNOSIS — K449 Diaphragmatic hernia without obstruction or gangrene: Secondary | ICD-10-CM | POA: Insufficient documentation

## 2017-12-02 DIAGNOSIS — J4531 Mild persistent asthma with (acute) exacerbation: Secondary | ICD-10-CM | POA: Diagnosis not present

## 2017-12-02 DIAGNOSIS — I481 Persistent atrial fibrillation: Secondary | ICD-10-CM | POA: Diagnosis not present

## 2017-12-02 DIAGNOSIS — I10 Essential (primary) hypertension: Secondary | ICD-10-CM | POA: Diagnosis not present

## 2017-12-02 DIAGNOSIS — E039 Hypothyroidism, unspecified: Secondary | ICD-10-CM | POA: Diagnosis not present

## 2017-12-02 DIAGNOSIS — M109 Gout, unspecified: Secondary | ICD-10-CM | POA: Insufficient documentation

## 2017-12-02 DIAGNOSIS — Z950 Presence of cardiac pacemaker: Secondary | ICD-10-CM | POA: Diagnosis not present

## 2017-12-02 DIAGNOSIS — I5032 Chronic diastolic (congestive) heart failure: Secondary | ICD-10-CM | POA: Diagnosis not present

## 2017-12-02 DIAGNOSIS — I4819 Other persistent atrial fibrillation: Secondary | ICD-10-CM | POA: Diagnosis present

## 2017-12-02 DIAGNOSIS — K219 Gastro-esophageal reflux disease without esophagitis: Secondary | ICD-10-CM | POA: Insufficient documentation

## 2017-12-02 DIAGNOSIS — Z885 Allergy status to narcotic agent status: Secondary | ICD-10-CM | POA: Insufficient documentation

## 2017-12-02 DIAGNOSIS — Z85828 Personal history of other malignant neoplasm of skin: Secondary | ICD-10-CM | POA: Insufficient documentation

## 2017-12-02 DIAGNOSIS — Z881 Allergy status to other antibiotic agents status: Secondary | ICD-10-CM | POA: Diagnosis not present

## 2017-12-02 DIAGNOSIS — Z66 Do not resuscitate: Secondary | ICD-10-CM | POA: Diagnosis not present

## 2017-12-02 DIAGNOSIS — E785 Hyperlipidemia, unspecified: Secondary | ICD-10-CM | POA: Insufficient documentation

## 2017-12-02 DIAGNOSIS — F419 Anxiety disorder, unspecified: Secondary | ICD-10-CM | POA: Diagnosis not present

## 2017-12-02 DIAGNOSIS — Z8249 Family history of ischemic heart disease and other diseases of the circulatory system: Secondary | ICD-10-CM | POA: Insufficient documentation

## 2017-12-02 DIAGNOSIS — E038 Other specified hypothyroidism: Secondary | ICD-10-CM | POA: Diagnosis not present

## 2017-12-02 DIAGNOSIS — Z96642 Presence of left artificial hip joint: Secondary | ICD-10-CM | POA: Insufficient documentation

## 2017-12-02 DIAGNOSIS — J45909 Unspecified asthma, uncomplicated: Principal | ICD-10-CM | POA: Insufficient documentation

## 2017-12-02 DIAGNOSIS — I6523 Occlusion and stenosis of bilateral carotid arteries: Secondary | ICD-10-CM | POA: Insufficient documentation

## 2017-12-02 DIAGNOSIS — Z880 Allergy status to penicillin: Secondary | ICD-10-CM | POA: Insufficient documentation

## 2017-12-02 DIAGNOSIS — I130 Hypertensive heart and chronic kidney disease with heart failure and stage 1 through stage 4 chronic kidney disease, or unspecified chronic kidney disease: Secondary | ICD-10-CM | POA: Insufficient documentation

## 2017-12-02 DIAGNOSIS — I482 Chronic atrial fibrillation: Secondary | ICD-10-CM | POA: Insufficient documentation

## 2017-12-02 DIAGNOSIS — Z86010 Personal history of colonic polyps: Secondary | ICD-10-CM | POA: Insufficient documentation

## 2017-12-02 DIAGNOSIS — I160 Hypertensive urgency: Secondary | ICD-10-CM

## 2017-12-02 DIAGNOSIS — I480 Paroxysmal atrial fibrillation: Secondary | ICD-10-CM | POA: Diagnosis not present

## 2017-12-02 DIAGNOSIS — I452 Bifascicular block: Secondary | ICD-10-CM | POA: Diagnosis not present

## 2017-12-02 DIAGNOSIS — Z7722 Contact with and (suspected) exposure to environmental tobacco smoke (acute) (chronic): Secondary | ICD-10-CM | POA: Insufficient documentation

## 2017-12-02 DIAGNOSIS — Z7901 Long term (current) use of anticoagulants: Secondary | ICD-10-CM | POA: Insufficient documentation

## 2017-12-02 DIAGNOSIS — R0602 Shortness of breath: Secondary | ICD-10-CM

## 2017-12-02 LAB — COMPREHENSIVE METABOLIC PANEL
ALK PHOS: 84 U/L (ref 38–126)
ALT: 24 U/L (ref 14–54)
AST: 36 U/L (ref 15–41)
Albumin: 4 g/dL (ref 3.5–5.0)
Anion gap: 14 (ref 5–15)
BILIRUBIN TOTAL: 0.8 mg/dL (ref 0.3–1.2)
BUN: 23 mg/dL — AB (ref 6–20)
CO2: 21 mmol/L — ABNORMAL LOW (ref 22–32)
Calcium: 9.3 mg/dL (ref 8.9–10.3)
Chloride: 105 mmol/L (ref 101–111)
Creatinine, Ser: 1.29 mg/dL — ABNORMAL HIGH (ref 0.44–1.00)
GFR calc Af Amer: 41 mL/min — ABNORMAL LOW (ref >60)
GFR, EST NON AFRICAN AMERICAN: 35 mL/min — AB (ref >60)
Glucose, Bld: 151 mg/dL — ABNORMAL HIGH (ref 65–99)
Potassium: 4.6 mmol/L (ref 3.5–5.1)
Sodium: 140 mmol/L (ref 135–145)
TOTAL PROTEIN: 6.9 g/dL (ref 6.5–8.1)

## 2017-12-02 LAB — CBC WITH DIFFERENTIAL/PLATELET
BASOS ABS: 0 10*3/uL (ref 0.0–0.1)
Basophils Relative: 1 %
EOS PCT: 0 %
Eosinophils Absolute: 0 10*3/uL (ref 0.0–0.7)
HCT: 37.4 % (ref 36.0–46.0)
Hemoglobin: 12.1 g/dL (ref 12.0–15.0)
Lymphocytes Relative: 11 %
Lymphs Abs: 0.6 10*3/uL — ABNORMAL LOW (ref 0.7–4.0)
MCH: 32.4 pg (ref 26.0–34.0)
MCHC: 32.4 g/dL (ref 30.0–36.0)
MCV: 100 fL (ref 78.0–100.0)
MONO ABS: 0.3 10*3/uL (ref 0.1–1.0)
Monocytes Relative: 6 %
Neutro Abs: 4.5 10*3/uL (ref 1.7–7.7)
Neutrophils Relative %: 82 %
PLATELETS: 199 10*3/uL (ref 150–400)
RBC: 3.74 MIL/uL — AB (ref 3.87–5.11)
RDW: 14.2 % (ref 11.5–15.5)
WBC: 5.4 10*3/uL (ref 4.0–10.5)

## 2017-12-02 LAB — I-STAT CG4 LACTIC ACID, ED
Lactic Acid, Venous: 1.39 mmol/L (ref 0.5–1.9)
Lactic Acid, Venous: 1.86 mmol/L (ref 0.5–1.9)

## 2017-12-02 LAB — BRAIN NATRIURETIC PEPTIDE: B NATRIURETIC PEPTIDE 5: 783.1 pg/mL — AB (ref 0.0–100.0)

## 2017-12-02 MED ORDER — TEMAZEPAM 15 MG PO CAPS
30.0000 mg | ORAL_CAPSULE | Freq: Every evening | ORAL | Status: DC | PRN
  Administered 2017-12-02 – 2017-12-03 (×2): 30 mg via ORAL
  Filled 2017-12-02 (×2): qty 2

## 2017-12-02 MED ORDER — LEVOFLOXACIN 500 MG PO TABS
500.0000 mg | ORAL_TABLET | ORAL | Status: AC
  Administered 2017-12-02: 500 mg via ORAL
  Filled 2017-12-02: qty 1

## 2017-12-02 MED ORDER — ACETAMINOPHEN 650 MG RE SUPP: 650.0000 mg | Freq: Four times a day (QID) | RECTAL | Status: DC | PRN

## 2017-12-02 MED ORDER — APIXABAN 2.5 MG PO TABS
2.5000 mg | ORAL_TABLET | Freq: Two times a day (BID) | ORAL | Status: DC
  Administered 2017-12-02 – 2017-12-04 (×4): 2.5 mg via ORAL
  Filled 2017-12-02 (×5): qty 1

## 2017-12-02 MED ORDER — SODIUM CHLORIDE 0.9 % IV SOLN
INTRAVENOUS | Status: AC
  Administered 2017-12-02 – 2017-12-03 (×2): via INTRAVENOUS

## 2017-12-02 MED ORDER — PANTOPRAZOLE SODIUM 40 MG PO TBEC
40.0000 mg | DELAYED_RELEASE_TABLET | Freq: Every day | ORAL | Status: DC
  Administered 2017-12-02 – 2017-12-04 (×3): 40 mg via ORAL
  Filled 2017-12-02 (×3): qty 1

## 2017-12-02 MED ORDER — IPRATROPIUM-ALBUTEROL 0.5-2.5 (3) MG/3ML IN SOLN
3.0000 mL | Freq: Four times a day (QID) | RESPIRATORY_TRACT | Status: DC
  Administered 2017-12-02 (×2): 3 mL via RESPIRATORY_TRACT
  Filled 2017-12-02 (×2): qty 3

## 2017-12-02 MED ORDER — IPRATROPIUM-ALBUTEROL 0.5-2.5 (3) MG/3ML IN SOLN
3.0000 mL | Freq: Once | RESPIRATORY_TRACT | Status: AC
  Administered 2017-12-02: 3 mL via RESPIRATORY_TRACT
  Filled 2017-12-02: qty 3

## 2017-12-02 MED ORDER — HYDRALAZINE HCL 20 MG/ML IJ SOLN
20.0000 mg | Freq: Once | INTRAMUSCULAR | Status: AC
  Administered 2017-12-02: 20 mg via INTRAVENOUS
  Filled 2017-12-02: qty 1

## 2017-12-02 MED ORDER — LEVOFLOXACIN 500 MG PO TABS
250.0000 mg | ORAL_TABLET | Freq: Every day | ORAL | Status: DC
  Administered 2017-12-03 – 2017-12-04 (×2): 250 mg via ORAL
  Filled 2017-12-02 (×2): qty 1

## 2017-12-02 MED ORDER — AMLODIPINE BESYLATE 5 MG PO TABS
5.0000 mg | ORAL_TABLET | Freq: Every day | ORAL | Status: DC
  Administered 2017-12-02 – 2017-12-03 (×2): 5 mg via ORAL
  Filled 2017-12-02 (×2): qty 1

## 2017-12-02 MED ORDER — LORAZEPAM 0.5 MG PO TABS
0.5000 mg | ORAL_TABLET | Freq: Three times a day (TID) | ORAL | Status: DC | PRN
  Administered 2017-12-03: 0.5 mg via ORAL
  Filled 2017-12-02: qty 1

## 2017-12-02 MED ORDER — HYDRALAZINE HCL 50 MG PO TABS
50.0000 mg | ORAL_TABLET | Freq: Two times a day (BID) | ORAL | Status: DC
  Administered 2017-12-02 – 2017-12-04 (×5): 50 mg via ORAL
  Filled 2017-12-02 (×2): qty 1
  Filled 2017-12-02: qty 2
  Filled 2017-12-02 (×2): qty 1

## 2017-12-02 MED ORDER — POLYETHYLENE GLYCOL 3350 17 G PO PACK: 17.0000 g | PACK | Freq: Every day | ORAL | Status: DC | PRN

## 2017-12-02 MED ORDER — PREDNISONE 20 MG PO TABS
40.0000 mg | ORAL_TABLET | Freq: Every day | ORAL | Status: DC
  Administered 2017-12-02 – 2017-12-04 (×3): 40 mg via ORAL
  Filled 2017-12-02 (×3): qty 2

## 2017-12-02 MED ORDER — FUROSEMIDE 20 MG PO TABS: 40.0000 mg | ORAL_TABLET | Freq: Every day | ORAL | Status: DC | PRN

## 2017-12-02 MED ORDER — ACETAMINOPHEN 325 MG PO TABS: 650.0000 mg | ORAL_TABLET | Freq: Four times a day (QID) | ORAL | Status: DC | PRN

## 2017-12-02 MED ORDER — BENZONATATE 100 MG PO CAPS: 100.0000 mg | ORAL_CAPSULE | Freq: Three times a day (TID) | ORAL | Status: DC | PRN

## 2017-12-02 MED ORDER — NITROGLYCERIN 0.4 MG SL SUBL: 0.4000 mg | SUBLINGUAL_TABLET | SUBLINGUAL | Status: DC | PRN

## 2017-12-02 MED ORDER — ALBUTEROL SULFATE (2.5 MG/3ML) 0.083% IN NEBU: 2.5000 mg | INHALATION_SOLUTION | RESPIRATORY_TRACT | Status: DC | PRN

## 2017-12-02 MED ORDER — LEVOTHYROXINE SODIUM 88 MCG PO TABS
88.0000 ug | ORAL_TABLET | Freq: Every day | ORAL | Status: DC
  Administered 2017-12-03 – 2017-12-04 (×2): 88 ug via ORAL
  Filled 2017-12-02 (×2): qty 1

## 2017-12-02 MED ORDER — FEBUXOSTAT 40 MG PO TABS
40.0000 mg | ORAL_TABLET | Freq: Every day | ORAL | Status: DC
  Administered 2017-12-02 – 2017-12-04 (×3): 40 mg via ORAL
  Filled 2017-12-02 (×3): qty 1

## 2017-12-02 NOTE — H&P (Addendum)
History and Physical:    [name]   HDQ:[id number] DOB: 03-04-1924 DOA: 12/02/2017  Referring MD/provider: Dr. Sabra Heck PCP: [name], MD   Patient coming from: Home  Chief Complaint: Cough and shortness of breath  History of Present Illness:   [name] is an 84 y.o. female with history of atrial fibrillation who was in her usual state of good health, living by herself in her own home, until 2 weeks ago when she noted onset of cough. Patient notes the cough was frequent and kept her up at night. Was seen in the ED where chest x-ray was negative. Was started on clarithromycin by her PCP and finished a 10 day course. Patient coughed through much of that course however on the ninth day of antibiotics patient noted entire resolution of cough, she felt much better so she stopped taking the antibiotics. 3 days after this patient had recurrence of her cough now no longer productive but very frequent. Yesterday patient notes she was coughing constantly and was not able to rest at all. She also notes shortness of breath and profound fatigue over the past 24 hours. She has had decreased by mouth intake due to anorexia.  Patient denies fevers or chills. Patient denies body aches myalgias. Patient does admit to fatigue as noted above but she attributes this to constant cough. Patient notes cough was initially productive of thick yellow phlegm but is now nonproductive since her course of clarithromycin. No orthopnea. No PND. No chest pain. No pleuritic chest pain.  Patient has history of childhood asthma but has not used inhalers for over 80 years. She denies any problems with asthma at all. She did live with her husband for 35 years who was a 2 pack a day smoker. Patient denies any known diagnosis of COPD   ED Course:  The patient was noted to be quite dyspneic and distressed, apparently had audible inspiratory and expiratory wheezing throughout. She was treated with continuous  nebulizers, Solu-Medrol and oxygen with good effect. Patient is now admitted for ongoing treatment and states that she feels much better than she did this morning.  ROS:   ROS   Review of Systems: General: No fever, chills, weight changes Skin: No rashes, lesions, wounds Eyes: no discharge, redness, pain HENT: no ear pain, hearing loss, drainage, tinnitus Endocrine: no heat/cold intolerance, no polyuria Respiratory: Positive cough, and shortness of breath, no hemoptysis Cardiovascular: No palpitations, chest pain GI: No nausea, vomiting, diarrhea, constipation GU: No dysuria, increased frequency CNS: No numbness, dizziness, headache Musculoskeletal: No back pain, joint pain Blood/lymphatics: No easy bruising, bleeding  Past Medical History:   Past Medical History:  Diagnosis Date  . Anemia   . Anxiety   . Arthritis   . Asthma   . Benign positional vertigo   . Cardiac pacemaker in situ 05/01/2015   for syncope & Sx Bradycardia -> Medtronic MRI compatible pacemaker placed Dr. Caryl Comes   . Chronic diarrhea   . Chronic kidney disease, stage IV (severe) ([place])   . Colon polyps   . Diastolic dysfunction, left ventricle 11/03/2013  . Diverticulosis   . Gout   . Hemorrhoids   . Hyperlipidemia   . Hypertensive heart disease    no significant RAS by MRA 2002- (<30% LRAS)   . Hypothyroidism   . IBS (irritable bowel syndrome)   . LBBB (left bundle branch block)- new 11/01/13 11/01/2013  . Paroxysmal atrial fibrillation 11/01/2013   Intermittent through the years and  recurrent in December of 2014 treated with amiodarone;; Negative Myoview 10/2013  . Peripheral vascular disease    02-40% LICA, 9-73% RICA by doppler 2009   . Sinus arrest 04/28/2015  . Syncope and collapse 04/28/2015   s/p PPM  . Tubulovillous adenoma 4/07    Past Surgical History:   Past Surgical History:  Procedure Laterality Date  . APPENDECTOMY    . CATARACT EXTRACTION    . EP IMPLANTABLE DEVICE N/A  05/01/2015   Procedure: Pacemaker Implant;  Surgeon: Deboraha Sprang, MD;  Location: [facility];  Service: Cardiovascular;  Laterality: N/A;  . HIP ARTHROPLASTY Left 05/01/2015   Procedure: ARTHROPLASTY BIPOLAR HIP (HEMIARTHROPLASTY);  Surgeon: Rod Can, MD;  Location: [facility];  Service: Orthopedics;  Laterality: Left;  . LOW ANTERIOR BOWEL RESECTION  7/08  . [place] SURGERY  2000  . NM MYOVIEW LTD  03/18/2013   Negative for ischemia or infarction. EF 55%.  LOW RISK  . OOPHORECTOMY  1962   right  . squamous cell skin cancer removed    . TRANSTHORACIC ECHOCARDIOGRAM  10/2013, 02/2017   A) EF 55-60%, mild LVH, elevated bili pressures. Mild aortic valve calcification;; B) Normal LV size and function with mild LVH. Unable to assess diastolic function. Normal PA pressures. Mild MR,Mild AI    Social History:   Social History   Socioeconomic History  . Marital status: Widowed    Spouse name: Not on file  . Number of children: 2  . Years of education: Not on file  . Highest education level: Not on file  Social Needs  . Financial resource strain: Not on file  . Food insecurity - worry: Not on file  . Food insecurity - inability: Not on file  . Transportation needs - medical: Not on file  . Transportation needs - non-medical: Not on file  Occupational History  . Occupation: retired  Tobacco Use  . Smoking status: Never Smoker  . Smokeless tobacco: Never Used  . Tobacco comment: never used tobacco  Substance and Sexual Activity  . Alcohol use: No    Alcohol/week: 0.0 oz  . Drug use: No  . Sexual activity: No  Other Topics Concern  . Not on file  Social History Narrative   She is a widowed mother of 2, one child is deceased. Grandmother of one. She appears to be working out routinely at [employer] using [employer]. She is otherwise quite active.   She does not drink alcohol, does not smoke.  She is retired from [employer].    Lives alone in a one  story condo.    Allergies   Penicillins; Xifaxan [rifaximin]; Ambien [zolpidem tartrate]; Amoxicillin; Ceftin [cefuroxime axetil]; Codeine phosphate; Colchicine; and Lipitor [atorvastatin]  Family history:   Family History  Problem Relation Age of Onset  . Cancer Mother 81       unknown  . Heart attack Father   . Heart disease Brother   . Stroke Maternal Grandmother   . Lung cancer Maternal Grandfather   . Stroke Paternal Grandmother   . Colon cancer Daughter     Current Medications:   Prior to Admission medications   Medication Sig Start Date End Date Taking? Authorizing Provider  acetaminophen (TYLENOL) 500 MG tablet Take 1.5 tablets (750 mg total) by mouth every 6 (six) hours as needed. 01/11/17  Yes Mackuen, Courteney Lyn, MD  amLODipine (NORVASC) 5 MG tablet TAKE 1 TABLET (5 MG TOTAL) BY MOUTH DAILY.  03/21/17  Yes [name], MD  clarithromycin (BIAXIN) 500 MG tablet Take 1 tablet (500 mg total) by mouth 2 (two) times daily. 12/01/17  Yes [name], MD  [facility] VITAMIN B12 1000 MCG tablet TAKE 1 TABLET BY MOUTH EVERY DAY 05/02/17  Yes [name], MD  ELIQUIS 2.5 MG TABS tablet TAKE 1 TABLET BY MOUTH TWICE A DAY 07/09/17  Yes [name], MD  furosemide (LASIX) 40 MG tablet Take 1 tablet (40 mg total) by mouth daily as needed for fluid (more than 2-3 pounds overnight and/or 5 pounds in a week). 01/27/17  Yes [name], MD  hydrALAZINE (APRESOLINE) 50 MG tablet Take 1 tablet (50 mg total) by mouth 2 (two) times daily. 04/14/17 12/02/17 Yes [name], MD  HYDROcodone-homatropine (HYDROMET) 5-1.5 MG/5ML syrup Take 5 mLs by mouth every 4 (four) hours as needed. 11/18/17  Yes [name], MD  Hyoscyamine Sulfate SL (LEVSIN/SL) 0.125 MG SUBL Take 1-2 tablets by mouth every 4 hours as needed 05/05/17  Yes Ladene Artist, MD  levothyroxine (SYNTHROID, LEVOTHROID) 88 MCG tablet TAKE 1 TABLET (88 MCG TOTAL) BY MOUTH DAILY BEFORE BREAKFAST. 05/16/17  Yes [name],  MD  LORazepam (ATIVAN) 0.5 MG tablet TAKE 1 TABLET BY MOUTH EVERY 8 HOURS AS NEEDED 02/26/17  Yes [name], MD  omeprazole (PRILOSEC) 20 MG capsule Take 1 capsule (20 mg total) by mouth daily. 05/05/17  Yes Ladene Artist, MD  temazepam (RESTORIL) 30 MG capsule TAKE ONE CAPSULE BY MOUTH AT BEDTIME *DRUG NOT COVERED* 06/20/17  Yes [name], MD  ULORIC 40 MG tablet Take 1 tablet by mouth daily. 03/12/16  Yes [provider]  benzonatate (TESSALON) 100 MG capsule Take 1 capsule (100 mg total) by mouth every 8 (eight) hours. Patient not taking: Reported on 12/02/2017 11/15/17   Ocie Cornfield T, PA-C  carvedilol (COREG) 12.5 MG tablet Take 1 tablet (12.5 mg total) by mouth 2 (two) times daily. 03/13/17 07/16/17  [name], MD  clarithromycin (BIAXIN) 500 MG tablet Take 1 tablet (500 mg total) by mouth 2 (two) times daily. Patient not taking: Reported on 12/02/2017 11/18/17   [name], MD  doxycycline (VIBRA-TABS) 100 MG tablet Take 1 tablet (100 mg total) by mouth 2 (two) times daily. Patient not taking: Reported on 12/02/2017 11/20/17   [name], MD  nitroGLYCERIN (NITROSTAT) 0.4 MG SL tablet Place 1 tablet (0.4 mg total) under the tongue every 5 (five) minutes x 3 doses as needed for chest pain. 02/27/16   [name], MD    Physical Exam:   Vitals:   12/02/17 1045 12/02/17 1100 12/02/17 1115 12/02/17 1130  BP: (!) 189/64 (!) 165/70 (!) 171/74 (!) 177/65  Pulse: 63 61 62 (!) 58  Resp: 20 16 17  (!) 21  Temp:      TempSrc:      SpO2: 92% 94% 94% 94%  Weight:      Height:         Physical Exam: Blood pressure (!) 177/65, pulse (!) 58, temperature 98.6 F (37 C), temperature source Oral, resp. rate (!) 21, height 5' (1.524 m), weight 59 kg (130 lb), SpO2 94 %. Gen: Tired-appearing female lying at 30 in no apparent respiratory distress. She is able to speak in full sentences without difficulty. Eyes: Sclerae anicteric. Conjunctiva mildly injected. Neck:  Supple, no jugular venous distention. Chest: Poor air entry bilaterally with no adventitious  sounds. I did not hear any wheezing. CV: Distant, regular, no audible murmurs. Abdomen: NABS, soft, nondistended, nontender. No tenderness to light or deep palpation. No rebound, no guarding. Extremities: No edema.  Skin: Warm and dry. No rashes, lesions or wounds. Neuro: Alert and oriented times 3; grossly nonfocal. Psych: Patient is cooperative, logical and coherent with appropriate mood and affect.  Data Review:    Labs: Basic Metabolic Panel: Recent Labs  Lab 12/02/17 0906  NA 140  K 4.6  CL 105  CO2 21*  GLUCOSE 151*  BUN 23*  CREATININE 1.29*  CALCIUM 9.3   Liver Function Tests: Recent Labs  Lab 12/02/17 0906  AST 36  ALT 24  ALKPHOS 84  BILITOT 0.8  PROT 6.9  ALBUMIN 4.0   No results for input(s): LIPASE, AMYLASE in the last 168 hours. No results for input(s): AMMONIA in the last 168 hours. CBC: Recent Labs  Lab 12/02/17 0906  WBC 5.4  NEUTROABS 4.5  HGB 12.1  HCT 37.4  MCV 100.0  PLT 199   Cardiac Enzymes: No results for input(s): CKTOTAL, CKMB, CKMBINDEX, TROPONINI in the last 168 hours.  BNP (last 3 results) No results for input(s): PROBNP in the last 8760 hours. CBG: No results for input(s): GLUCAP in the last 168 hours.  Urinalysis    Component Value Date/Time   COLORURINE YELLOW 12/03/2013 1942   APPEARANCEUR CLEAR 12/03/2013 1942   LABSPEC 1.020 12/03/2013 1942   PHURINE 5.0 12/03/2013 1942   GLUCOSEU NEGATIVE 12/03/2013 1942   HGBUR NEGATIVE 12/03/2013 1942   HGBUR negative 02/22/2008 0000   BILIRUBINUR negative 06/28/2016 1657   KETONESUR NEGATIVE 12/03/2013 1942   PROTEINUR negative 06/28/2016 1657   PROTEINUR 30 (A) 12/03/2013 1942   UROBILINOGEN 0.2 06/28/2016 1657   UROBILINOGEN 0.2 12/03/2013 1942   NITRITE positive 06/28/2016 1657   NITRITE NEGATIVE 12/03/2013 1942   LEUKOCYTESUR Trace (A) 06/28/2016 1657      Radiographic  Studies: Dg Chest Port 1 View  Result Date: 12/02/2017 CLINICAL DATA:  Cough and congestion. Chest pressure and shortness of breath. EXAM: PORTABLE CHEST 1 VIEW COMPARISON:  11/15/2017 FINDINGS: 1045 hours. Lungs are hyperexpanded. The lungs are clear without focal pneumonia, edema, pneumothorax or pleural effusion. The cardio pericardial silhouette is enlarged. Left permanent pacemaker remains in place. The visualized bony structures of the thorax are intact. Telemetry leads overlie the chest. IMPRESSION: Cardiomegaly without acute cardiopulmonary findings. Electronically Signed   By: Misty Stanley M.D.   On: 12/02/2017 10:58    EKG: Independently reviewed. Poor baseline. Rhythm at 89 with frequent PVCs and underlying A. flutter   Assessment/Plan:   Active Problems:   Hypothyroidism   Hyperlipidemia   Hypertensive heart disease   Reactive airway disease   Persistent Paroxysmal atrial fibrillation: CHA2DS2-VASc Score 5, on Eliquis  REACTIVE AIRWAY DISEASE Patient with h/o childhood asthma, no inhaler use for > 80 years Did have significant second hand smoke exposure--lived w 2ppd husband x 35 years She has responded well to steroids and inhaled bronchodilators CXR w/o overt infiltrate, lung exam w/o focal rales or consolidation   Will continue present course of tx with prednisone 40 mg by mouth daily and DuoNeb's every 6h I am also reluctantly adding Levaquin as patient has not done well with her macrolide and she is penicillin allergic. Will need to watch for mental status changes or other side effects given advanced age This has been renally dosed  2 view CXR ordered for the morning Patient might benefit  from outpatient PFTs after her flare has resolved to see if she does indeed have lung disease from second hand smoke  ATRIAL FIBRILLATION Paced to control rate Continue eliquis  HTN Continue amlodipine and hydralazine   HYPOTHYROIDISM  Continue synthroid  ANXIETY  Will  continue lorazepam prn AND temazepam per home regimen Watch for sedation and titrate as needed   GOUT Continue uloric    Patient is DO NOT RESUSCITATE   Other information:   DVT prophylaxis: Lovenox ordered. Code Status: DO NOT RESUSCITATE Family Communication: Patient's daughter-in-law who is also her healthcare power of attorney is at bedside  Disposition Plan: Home Consults called: None Admission status: Observation    The medical decision making is of moderate complexity, therefore this is a level 2 visit.  Dewaine Oats Derek Jack [facility] Pager [phone] Cell: [phone]   If 7PM-7AM, please contact night-coverage [email or web address] Password [email or web address] 12/02/2017, 12:00 PM

## 2017-12-02 NOTE — ED Notes (Signed)
Called to room where iv site has swelling, bruisineg and moderates bleeding from site. IV dc and pressure applied.

## 2017-12-02 NOTE — ED Provider Notes (Signed)
[facility] EMERGENCY DEPARTMENT Provider Note   CSN: [id number] Arrival date & time: 12/02/17  0857     History   Chief Complaint Chief Complaint  Patient presents with  . Shortness of Breath    HPI [name] is a 84 y.o. female.  HPI  The patient is an elderly 84 year old female who still lives independently, she still drives, she does have a known history of atrial fibrillation as well as chronic kidney disease stage IV, history of hypertension and hyperlipidemia as well as a history of asthma.  She reports and her family member confirms that over the last couple of weeks she has had a progressive respiratory illness which started with some mild coughing.  At that time she had presented to the emergency department for evaluation had a negative x-ray and was prescribed cough medication for a presumed viral illness.  The patient states that she did not tolerate the cough medicine very well which made her nauseated, she went to her family doctor this week and was prescribed doxycycline for a possible infectious etiology of a more bacterial origin though no further evaluation was performed in the office.  She has finished a course of doxycycline but continues to get more short of breath and feels as though she is severely dyspneic.  She was found to have low oxygen saturations, she called her daughter with significant dyspnea who brought her to the emergency department.  The patient is able to give some history though she speaks in shortened sentences, the daughter is also historian, the medical record was reviewed for further information.  Past Medical History:  Diagnosis Date  . Anemia   . Anxiety   . Arthritis   . Asthma   . Benign positional vertigo   . Cardiac pacemaker in situ 05/01/2015   for syncope & Sx Bradycardia -> Medtronic MRI compatible pacemaker placed Dr. Caryl Comes   . Chronic diarrhea   . Chronic kidney disease, stage IV (severe) ([place])   . Colon  polyps   . Diastolic dysfunction, left ventricle 11/03/2013  . Diverticulosis   . Gout   . Hemorrhoids   . Hyperlipidemia   . Hypertensive heart disease    no significant RAS by MRA 2002- (<30% LRAS)   . Hypothyroidism   . IBS (irritable bowel syndrome)   . LBBB (left bundle branch block)- new 11/01/13 11/01/2013  . Paroxysmal atrial fibrillation 11/01/2013   Intermittent through the years and recurrent in December of 2014 treated with amiodarone;; Negative Myoview 10/2013  . Peripheral vascular disease    54-65% LICA, 0-35% RICA by doppler 2009   . Sinus arrest 04/28/2015  . Syncope and collapse 04/28/2015   s/p PPM  . Tubulovillous adenoma 4/07    Patient Active Problem List   Diagnosis Date Noted  . Chronic back pain 07/16/2017  . Iron deficiency anemia 03/24/2017  . IDA (iron deficiency anemia) 03/20/2017  . Dyspnea on exertion 02/21/2017  . Insomnia, persistent 07/26/2015  . Cardiac pacemaker in situ   . Femoral neck fracture ([place]) 05/01/2015  . Right bundle branch block (RBBB) intermittent  04/28/2015  . Closed left hip fracture ([place])   . Fall   . Syncope, cardiogenic   . Abnormal liver function tests 11/10/2014  . Systolic hypertension, isolated; difficult to control 10/04/2014  . CKD (chronic kidney disease), stage IV ([place]) 07/31/2014  . Gout 06/17/2014  . Edema of both legs 02/09/2014  . Encounter for long-term (current) use of other medications  12/25/2013  . Hypertensive heart/kidney disease w/chronic kidney disease stage IV ([place]) 12/25/2013  . Chest pain 12/03/2013  . Chronic diastolic heart failure ([place]) 12/03/2013  . H/O tachycardia-bradycardia syndrome   . Chronic anticoagulation- Eliquis 11/17/2013  . LBBB (left bundle branch block)- new 11/01/13 11/01/2013  . Persistent Paroxysmal atrial fibrillation: CHA2DS2-VASc Score 5, on Eliquis 11/01/2013  . Peripheral vascular disease   . Benign positional vertigo   . Irritable bowel syndrome 03/22/2009  .  Anxiety   . Reactive airway disease 01/13/2008  . Chronic anemia   . Osteoarthritis   . Hypothyroidism   . Hyperlipidemia   . Hypertensive heart disease     Past Surgical History:  Procedure Laterality Date  . APPENDECTOMY    . CATARACT EXTRACTION    . EP IMPLANTABLE DEVICE N/A 05/01/2015   Procedure: Pacemaker Implant;  Surgeon: Deboraha Sprang, MD;  Location: [facility];  Service: Cardiovascular;  Laterality: N/A;  . HIP ARTHROPLASTY Left 05/01/2015   Procedure: ARTHROPLASTY BIPOLAR HIP (HEMIARTHROPLASTY);  Surgeon: Rod Can, MD;  Location: [facility];  Service: Orthopedics;  Laterality: Left;  . LOW ANTERIOR BOWEL RESECTION  7/08  . [place] SURGERY  2000  . NM MYOVIEW LTD  03/18/2013   Negative for ischemia or infarction. EF 55%.  LOW RISK  . OOPHORECTOMY  1962   right  . squamous cell skin cancer removed    . TRANSTHORACIC ECHOCARDIOGRAM  10/2013, 02/2017   A) EF 55-60%, mild LVH, elevated bili pressures. Mild aortic valve calcification;; B) Normal LV size and function with mild LVH. Unable to assess diastolic function. Normal PA pressures. Mild MR,Mild AI    OB History    No data available       Home Medications    Prior to Admission medications   Medication Sig Start Date End Date Taking? Authorizing Provider  acetaminophen (TYLENOL) 500 MG tablet Take 1.5 tablets (750 mg total) by mouth every 6 (six) hours as needed. 01/11/17  Yes Mackuen, Courteney Lyn, MD  amLODipine (NORVASC) 5 MG tablet TAKE 1 TABLET (5 MG TOTAL) BY MOUTH DAILY. 03/21/17  Yes [name], MD  clarithromycin (BIAXIN) 500 MG tablet Take 1 tablet (500 mg total) by mouth 2 (two) times daily. 12/01/17  Yes [name], MD  [facility] VITAMIN B12 1000 MCG tablet TAKE 1 TABLET BY MOUTH EVERY DAY 05/02/17  Yes [name], MD  ELIQUIS 2.5 MG TABS tablet TAKE 1 TABLET BY MOUTH TWICE A DAY 07/09/17  Yes [name], MD  furosemide (LASIX) 40 MG tablet Take 1 tablet (40 mg total) by mouth daily  as needed for fluid (more than 2-3 pounds overnight and/or 5 pounds in a week). 01/27/17  Yes [name], MD  hydrALAZINE (APRESOLINE) 50 MG tablet Take 1 tablet (50 mg total) by mouth 2 (two) times daily. 04/14/17 12/02/17 Yes [name], MD  HYDROcodone-homatropine (HYDROMET) 5-1.5 MG/5ML syrup Take 5 mLs by mouth every 4 (four) hours as needed. 11/18/17  Yes [name], MD  Hyoscyamine Sulfate SL (LEVSIN/SL) 0.125 MG SUBL Take 1-2 tablets by mouth every 4 hours as needed 05/05/17  Yes Ladene Artist, MD  levothyroxine (SYNTHROID, LEVOTHROID) 88 MCG tablet TAKE 1 TABLET (88 MCG TOTAL) BY MOUTH DAILY BEFORE BREAKFAST. 05/16/17  Yes [name], MD  LORazepam (ATIVAN) 0.5 MG tablet TAKE 1 TABLET BY MOUTH EVERY 8 HOURS AS NEEDED 02/26/17  Yes [name], MD  omeprazole ([place]) 20  MG capsule Take 1 capsule (20 mg total) by mouth daily. 05/05/17  Yes Ladene Artist, MD  temazepam (RESTORIL) 30 MG capsule TAKE ONE CAPSULE BY MOUTH AT BEDTIME *DRUG NOT COVERED* 06/20/17  Yes [name], MD  ULORIC 40 MG tablet Take 1 tablet by mouth daily. 03/12/16  Yes [provider]  benzonatate (TESSALON) 100 MG capsule Take 1 capsule (100 mg total) by mouth every 8 (eight) hours. Patient not taking: Reported on 12/02/2017 11/15/17   Ocie Cornfield T, PA-C  carvedilol (COREG) 12.5 MG tablet Take 1 tablet (12.5 mg total) by mouth 2 (two) times daily. 03/13/17 07/16/17  [name], MD  clarithromycin (BIAXIN) 500 MG tablet Take 1 tablet (500 mg total) by mouth 2 (two) times daily. Patient not taking: Reported on 12/02/2017 11/18/17   [name], MD  doxycycline (VIBRA-TABS) 100 MG tablet Take 1 tablet (100 mg total) by mouth 2 (two) times daily. Patient not taking: Reported on 12/02/2017 11/20/17   [name], MD  nitroGLYCERIN (NITROSTAT) 0.4 MG SL tablet Place 1 tablet (0.4 mg total) under the tongue every 5 (five) minutes x 3 doses as needed for chest pain. 02/27/16   [name], MD    Family History Family History  Problem Relation Age of Onset  . Cancer Mother 5       unknown  . Heart attack Father   . Heart disease Brother   . Stroke Maternal Grandmother   . Lung cancer Maternal Grandfather   . Stroke Paternal Grandmother   . Colon cancer Daughter     Social History Social History   Tobacco Use  . Smoking status: Never Smoker  . Smokeless tobacco: Never Used  . Tobacco comment: never used tobacco  Substance Use Topics  . Alcohol use: No    Alcohol/week: 0.0 oz  . Drug use: No     Allergies   Penicillins; Xifaxan [rifaximin]; Ambien [zolpidem tartrate]; Amoxicillin; Ceftin [cefuroxime axetil]; Codeine phosphate; Colchicine; and Lipitor [atorvastatin]   Review of Systems Review of Systems  All other systems reviewed and are negative.    Physical Exam Updated Vital Signs BP (!) 177/65   Pulse (!) 58   Temp 98.6 F (37 C) (Oral)   Resp (!) 21   Ht 5' (1.524 m)   Wt 59 kg (130 lb)   SpO2 94%   BMI 25.39 kg/m   Physical Exam  Constitutional: She appears well-developed and well-nourished. She appears distressed.  HENT:  Head: Normocephalic and atraumatic.  Mouth/Throat: Oropharynx is clear and moist. No oropharyngeal exudate.  Eyes: Conjunctivae and EOM are normal. Pupils are equal, round, and reactive to light. Right eye exhibits no discharge. Left eye exhibits no discharge. No scleral icterus.  Neck: Normal range of motion. Neck supple. No JVD present. No thyromegaly present.  Cardiovascular: Normal rate, normal heart sounds and intact distal pulses. Exam reveals no gallop and no friction rub.  No murmur heard. Paced rhythm, normal pulses, no JVD  Pulmonary/Chest: She is in respiratory distress. She has wheezes. She has no rales.  The patient has end expiratory wheezing, she speaks in shortened sentences, she has a slight increased work of breathing  Abdominal: Soft. Bowel sounds are normal. She exhibits no distension and no  mass. There is no tenderness.  Musculoskeletal: Normal range of motion. She exhibits no edema or tenderness.  Lymphadenopathy:    She has no cervical adenopathy.  Neurological: She is alert. Coordination normal.  Skin:  Skin is warm and dry. No rash noted. No erythema.  Psychiatric: She has a normal mood and affect. Her behavior is normal.  Nursing note and vitals reviewed.    ED Treatments / Results  Labs (all labs ordered are listed, but only abnormal results are displayed) Labs Reviewed  COMPREHENSIVE METABOLIC PANEL - Abnormal; Notable for the following components:      Result Value   CO2 21 (*)    Glucose, Bld 151 (*)    BUN 23 (*)    Creatinine, Ser 1.29 (*)    GFR calc non Af Amer 35 (*)    GFR calc Af Amer 41 (*)    All other components within normal limits  CBC WITH DIFFERENTIAL/PLATELET - Abnormal; Notable for the following components:   RBC 3.74 (*)    Lymphs Abs 0.6 (*)    All other components within normal limits  BRAIN NATRIURETIC PEPTIDE - Abnormal; Notable for the following components:   B Natriuretic Peptide 783.1 (*)    All other components within normal limits  URINALYSIS, ROUTINE W REFLEX MICROSCOPIC  I-STAT CG4 LACTIC ACID, ED  I-STAT TROPONIN, ED  I-STAT CG4 LACTIC ACID, ED    EKG  EKG Interpretation  Date/Time:  Tuesday December 02 2017 09:02:24 EST Ventricular Rate:  73 PR Interval:    QRS Duration: 162 QT Interval:  444 QTC Calculation: 489 R Axis:   -80 Text Interpretation:  Ventricular-paced rhythm with occasional Premature ventricular complexes Abnormal ECG Since last tracing electrical noise now present Confirmed by Noemi Chapel [phone]) on 12/02/2017 9:37:23 AM       Radiology Dg Chest Port 1 View  Result Date: 12/02/2017 CLINICAL DATA:  Cough and congestion. Chest pressure and shortness of breath. EXAM: PORTABLE CHEST 1 VIEW COMPARISON:  11/15/2017 FINDINGS: 1045 hours. Lungs are hyperexpanded. The lungs are clear without focal  pneumonia, edema, pneumothorax or pleural effusion. The cardio pericardial silhouette is enlarged. Left permanent pacemaker remains in place. The visualized bony structures of the thorax are intact. Telemetry leads overlie the chest. IMPRESSION: Cardiomegaly without acute cardiopulmonary findings. Electronically Signed   By: Misty Stanley M.D.   On: 12/02/2017 10:58    Procedures Procedures (including critical care time)  Medications Ordered in ED Medications  ipratropium-albuterol (DUONEB) 0.5-2.5 (3) MG/3ML nebulizer solution 3 mL (3 mLs Nebulization Given 12/02/17 1005)  hydrALAZINE (APRESOLINE) injection 20 mg (20 mg Intravenous Given 12/02/17 1132)     Initial Impression / Assessment and Plan / ED Course  I have reviewed the triage vital signs and the nursing notes.  Pertinent labs & imaging results that were available during my care of the patient were reviewed by me and considered in my medical decision making (see chart for details).  Clinical Course as of Dec 02 1144  Tue Dec 02, 2017  1026 Patient has been persistently hypertensive, will treat with antihypertensive due to severe hypertensive findings, I do not think this is necessarily accounting for the hypoxia and respiratory symptoms which is much more likely to be infectious BP: (!) 202/68 [BM]  1026 Leukocytosis absent, lactic acid normal. WBC: 5.4 [BM]  1050 April 2018 the patient had an echocardiogram showing a relatively normal ejection fraction with no wall motion abnormalities.  She does have an elevated BNP today.  [BM]  1051 B Natriuretic Peptide: (!) 783.1 [BM]  1115 Chest x-ray reveals cardiomegaly but no other findings.  Due to the patient's symptoms she will need to be admitted to the hospital.  She  will be admitted to medical service.  [BM]    Clinical Course User Index [BM] Noemi Chapel, MD    At this time the patient does appear to be hypoxic, 86% with deep breathing on room air, she is requiring supplemental  oxygen, she does have some wheezing, the question as to whether this is bacterial or viral is unclear, x-ray will be ordered.  Thankfully there is no leukocytosis, her lactic acid is normal, but I suspect that she has an infectious bronchitis, x-ray pending at this time.  Oxygen up to 92% on 2 L.  I do not see any JVD or peripheral edema to suggest that this could be CHF  Gust with hospitalist for admission.  The patient still has ongoing respiratory distress, requiring significant oxygen, BNP is elevated at 780 consistent with new onset heart failure however her symptoms are likely related to infectious bronchitis.  Discussed with hospitalist, there have been kind enough to admit  Final Clinical Impressions(s) / ED Diagnoses   Final diagnoses:  Respiratory distress  Acute congestive heart failure, unspecified heart failure type [facility])  Hypertensive urgency    ED Discharge Orders    None       Noemi Chapel, MD 12/02/17 1146

## 2017-12-02 NOTE — ED Triage Notes (Addendum)
Pt sts recent dx of bronchitis 2 weeks ago and had been improving last week Pt completed first round of abx. Pt sts sudden worsening of condition for the past 2 weeks. Pt endorses shob, productive cough, decreased appetite, chest heaviness. Pt placed on 2L of O2 [place] by nurse first. Pt denies fever or chills. SpO2 88-91%/RA- pt placed back on oxygen  94%/2L

## 2017-12-03 ENCOUNTER — Other Ambulatory Visit: Payer: Self-pay

## 2017-12-03 ENCOUNTER — Observation Stay (HOSPITAL_COMMUNITY): Payer: Medicare Other

## 2017-12-03 ENCOUNTER — Encounter (HOSPITAL_COMMUNITY): Payer: Self-pay | Admitting: General Practice

## 2017-12-03 DIAGNOSIS — E785 Hyperlipidemia, unspecified: Secondary | ICD-10-CM | POA: Diagnosis not present

## 2017-12-03 DIAGNOSIS — I481 Persistent atrial fibrillation: Secondary | ICD-10-CM | POA: Diagnosis not present

## 2017-12-03 DIAGNOSIS — R0603 Acute respiratory distress: Secondary | ICD-10-CM

## 2017-12-03 DIAGNOSIS — R262 Difficulty in walking, not elsewhere classified: Secondary | ICD-10-CM | POA: Diagnosis not present

## 2017-12-03 DIAGNOSIS — I110 Hypertensive heart disease with heart failure: Secondary | ICD-10-CM | POA: Diagnosis not present

## 2017-12-03 DIAGNOSIS — I509 Heart failure, unspecified: Secondary | ICD-10-CM

## 2017-12-03 DIAGNOSIS — J4531 Mild persistent asthma with (acute) exacerbation: Secondary | ICD-10-CM

## 2017-12-03 DIAGNOSIS — I5032 Chronic diastolic (congestive) heart failure: Secondary | ICD-10-CM

## 2017-12-03 DIAGNOSIS — R0602 Shortness of breath: Secondary | ICD-10-CM

## 2017-12-03 DIAGNOSIS — E038 Other specified hypothyroidism: Secondary | ICD-10-CM

## 2017-12-03 LAB — BASIC METABOLIC PANEL
ANION GAP: 10 (ref 5–15)
BUN: 21 mg/dL — ABNORMAL HIGH (ref 6–20)
CALCIUM: 8.6 mg/dL — AB (ref 8.9–10.3)
CHLORIDE: 105 mmol/L (ref 101–111)
CO2: 24 mmol/L (ref 22–32)
Creatinine, Ser: 1.13 mg/dL — ABNORMAL HIGH (ref 0.44–1.00)
GFR calc Af Amer: 48 mL/min — ABNORMAL LOW (ref >60)
GFR calc non Af Amer: 41 mL/min — ABNORMAL LOW (ref >60)
GLUCOSE: 129 mg/dL — AB (ref 65–99)
POTASSIUM: 4.2 mmol/L (ref 3.5–5.1)
Sodium: 139 mmol/L (ref 135–145)

## 2017-12-03 LAB — CBC
HEMATOCRIT: 33.1 % — AB (ref 36.0–46.0)
HEMOGLOBIN: 10.5 g/dL — AB (ref 12.0–15.0)
MCH: 31.1 pg (ref 26.0–34.0)
MCHC: 31.7 g/dL (ref 30.0–36.0)
MCV: 97.9 fL (ref 78.0–100.0)
Platelets: 160 10*3/uL (ref 150–400)
RBC: 3.38 MIL/uL — AB (ref 3.87–5.11)
RDW: 14.2 % (ref 11.5–15.5)
WBC: 2.8 10*3/uL — ABNORMAL LOW (ref 4.0–10.5)

## 2017-12-03 MED ORDER — DM-GUAIFENESIN ER 30-600 MG PO TB12
1.0000 | ORAL_TABLET | Freq: Two times a day (BID) | ORAL | Status: DC
  Administered 2017-12-03 – 2017-12-04 (×3): 1 via ORAL
  Filled 2017-12-03 (×2): qty 1

## 2017-12-03 MED ORDER — IPRATROPIUM-ALBUTEROL 0.5-2.5 (3) MG/3ML IN SOLN
3.0000 mL | Freq: Four times a day (QID) | RESPIRATORY_TRACT | Status: DC
  Administered 2017-12-03 (×2): 3 mL via RESPIRATORY_TRACT
  Filled 2017-12-03 (×2): qty 3

## 2017-12-03 MED ORDER — IPRATROPIUM-ALBUTEROL 0.5-2.5 (3) MG/3ML IN SOLN
3.0000 mL | Freq: Three times a day (TID) | RESPIRATORY_TRACT | Status: DC
  Administered 2017-12-04 (×2): 3 mL via RESPIRATORY_TRACT
  Filled 2017-12-03 (×2): qty 3

## 2017-12-03 MED ORDER — SODIUM CHLORIDE 3 % IN NEBU
4.0000 mL | INHALATION_SOLUTION | Freq: Every day | RESPIRATORY_TRACT | Status: DC
  Administered 2017-12-03 – 2017-12-04 (×2): 4 mL via RESPIRATORY_TRACT
  Filled 2017-12-03 (×2): qty 4

## 2017-12-03 NOTE — Progress Notes (Addendum)
PROGRESS NOTE  [name] HYW:[id number] DOB: [birth year] DOA: 12/02/2017 PCP: [name], MD  HPI/Recap of past 24 hours: [name] is an 84 y.o. female form home with past medical history of chronic atrial fibrillation on eliquis, childhood asthma who presented with persistent cough of 2 weeks duration. Was started on clarithromycin by her PCP and finished a 10 day course. Cough recurred 3 days later. Audible wheezing on presentation. Admitted for reactive airway disease.  12/03/17: Patient seen and examined with no family members at her bedside. States cough is persistent and dyspnea is mildly improving. Not on O2 supplement at home and now requiring 2L [place].  Assessment/Plan: Active Problems:   Hypothyroidism   Hyperlipidemia   Hypertensive heart disease   Reactive airway disease   Persistent Paroxysmal atrial fibrillation: CHA2DS2-VASc Score 5, on Eliquis  Reactive airway disease -h/o childhood asthma, no inhaler use for > 80 years -significant second hand smoke exposure--lived w 2ppd husband x 35 years -continue po steroids and inhaled bronchodilators -persistent wheezes and symptamology -12/03/17: added pulmonary toilet, duonebs q6h, hypersaline nebs daily, chest PT, and mucinex 1 tab BID. -O2 supplement to maintain o2 sat 92% or greater -po levaquin 250 mg daily day#1/7  Chronic A-fib -Rate controlled -Continue eliquis  HTN -stable -Continue amlodipine and hydralazine   Hypothyroidism -Continue synthroid  Anxiety  -stable -continue lorazepam prn, temazepam per home regimen  GOUT -stable -Continue uloric  Ambulatory dysfunction -uses walker at home prn -fall precaution -PT evaluate and treat   Leukopenia/anemia -drop in hg and wbc possibly hemodilutional -stop IV fluid and encourage po intake  Chronic dCHF EF 55-60% (2014) -not in acute exacerbation -caution with IV fluid -strict I&Os -daily  weight  GERD -stable -protonix    Code Status: DNR  Family Communication: none at bedside   Disposition Plan: will stay another midnight to continue round the clock breathing treatments and pulmonary toilet.  Severity: Moderate to severe due to persistent symptomatology and now requiring round the clock breathing treatments, O2 supplement, and antibiotics. Will require another midnight to continue her treatment.   Consultants:  none  Procedures:  none  Antimicrobials:  levaquin day#1/7  DVT prophylaxis:  Eliquis for chronic afib   Objective: Vitals:   12/02/17 1615 12/02/17 1717 12/02/17 2249 12/03/17 0606  BP: (!) 197/78 (!) 194/71 131/66 (!) 153/70  Pulse: (!) 59 69 (!) 59 61  Resp: 18 18 20 18   Temp:  98.2 F (36.8 C) 98.2 F (36.8 C) 97.9 F (36.6 C)  TempSrc:  Oral Oral Oral  SpO2: 93% 94% 93% 96%  Weight:  61 kg (134 lb 7.7 oz)    Height:  5\' 2"  (1.575 m)      Intake/Output Summary (Last 24 hours) at 12/03/2017 0825 Last data filed at 12/03/2017 4854 Gross per 24 hour  Intake 1680 ml  Output 600 ml  Net 1080 ml   Filed Weights   12/02/17 0909 12/02/17 1717  Weight: 59 kg (130 lb) 61 kg (134 lb 7.7 oz)    Exam:   General:  84 yo CF WD WN, uncomfortable due to cough. A&O x 3  Cardiovascular: IRR no rubs or gallops  Respiratory: Expiratory wheezes throughout and rales at bases  Abdomen: soft NT ND NBS x4  Musculoskeletal: non focal; no LE edema  Skin: no rash noted  Psychiatry: Mood appropriate for condition and setting.   Data Reviewed: CBC: Recent Labs  Lab 12/02/17 0906 12/03/17 0308  WBC 5.4 2.8*  NEUTROABS 4.5  --   HGB 12.1 10.5*  HCT 37.4 33.1*  MCV 100.0 97.9  PLT 199 161   Basic Metabolic Panel: Recent Labs  Lab 12/02/17 0906 12/03/17 0308  NA 140 139  K 4.6 4.2  CL 105 105  CO2 21* 24  GLUCOSE 151* 129*  BUN 23* 21*  CREATININE 1.29* 1.13*  CALCIUM 9.3 8.6*   GFR: Estimated Creatinine Clearance: 27.9  mL/min (A) (by C-G formula based on SCr of 1.13 mg/dL (H)). Liver Function Tests: Recent Labs  Lab 12/02/17 0906  AST 36  ALT 24  ALKPHOS 84  BILITOT 0.8  PROT 6.9  ALBUMIN 4.0   No results for input(s): LIPASE, AMYLASE in the last 168 hours. No results for input(s): AMMONIA in the last 168 hours. Coagulation Profile: No results for input(s): INR, PROTIME in the last 168 hours. Cardiac Enzymes: No results for input(s): CKTOTAL, CKMB, CKMBINDEX, TROPONINI in the last 168 hours. BNP (last 3 results) No results for input(s): PROBNP in the last 8760 hours. HbA1C: No results for input(s): HGBA1C in the last 72 hours. CBG: No results for input(s): GLUCAP in the last 168 hours. Lipid Profile: No results for input(s): CHOL, HDL, LDLCALC, TRIG, CHOLHDL, LDLDIRECT in the last 72 hours. Thyroid Function Tests: No results for input(s): TSH, T4TOTAL, FREET4, T3FREE, THYROIDAB in the last 72 hours. Anemia Panel: No results for input(s): VITAMINB12, FOLATE, FERRITIN, TIBC, IRON, RETICCTPCT in the last 72 hours. Urine analysis:    Component Value Date/Time   COLORURINE YELLOW 12/03/2013 1942   APPEARANCEUR CLEAR 12/03/2013 1942   LABSPEC 1.020 12/03/2013 1942   PHURINE 5.0 12/03/2013 1942   GLUCOSEU NEGATIVE 12/03/2013 1942   HGBUR NEGATIVE 12/03/2013 1942   HGBUR negative 02/22/2008 0000   BILIRUBINUR negative 06/28/2016 1657   KETONESUR NEGATIVE 12/03/2013 1942   PROTEINUR negative 06/28/2016 1657   PROTEINUR 30 (A) 12/03/2013 1942   UROBILINOGEN 0.2 06/28/2016 1657   UROBILINOGEN 0.2 12/03/2013 1942   NITRITE positive 06/28/2016 1657   NITRITE NEGATIVE 12/03/2013 1942   LEUKOCYTESUR Trace (A) 06/28/2016 1657   Sepsis Labs: @LABRCNTIP (procalcitonin:4,lacticidven:4)  )No results found for this or any previous visit (from the past 240 hour(s)).    Studies: Dg Chest Port 1 View  Result Date: 12/02/2017 CLINICAL DATA:  Cough and congestion. Chest pressure and shortness of  breath. EXAM: PORTABLE CHEST 1 VIEW COMPARISON:  11/15/2017 FINDINGS: 1045 hours. Lungs are hyperexpanded. The lungs are clear without focal pneumonia, edema, pneumothorax or pleural effusion. The cardio pericardial silhouette is enlarged. Left permanent pacemaker remains in place. The visualized bony structures of the thorax are intact. Telemetry leads overlie the chest. IMPRESSION: Cardiomegaly without acute cardiopulmonary findings. Electronically Signed   By: Misty Stanley M.D.   On: 12/02/2017 10:58    Scheduled Meds: . amLODipine  5 mg Oral Daily  . apixaban  2.5 mg Oral BID  . febuxostat  40 mg Oral Daily  . hydrALAZINE  50 mg Oral BID  . levofloxacin  250 mg Oral Daily  . levothyroxine  88 mcg Oral QAC breakfast  . pantoprazole  40 mg Oral Daily  . predniSONE  40 mg Oral Q breakfast    Continuous Infusions: . sodium chloride 100 mL/hr at 12/03/17 0050     LOS: 0 days     Kayleen Memos, MD [facility] Pager [phone]  If 7PM-7AM, please contact night-coverage [email or web address] Password [email or web address] 12/03/2017, 8:25 AM

## 2017-12-03 NOTE — Care Management Obs Status (Signed)
[facility] NOTIFICATION   Patient Details  Name: [name] MRN: [id number] Date of Birth: 1924/10/14   Medicare Observation Status Notification Given:  Yes    Carles Collet, RN 12/03/2017, 2:20 PM

## 2017-12-03 NOTE — Plan of Care (Signed)
Progressing

## 2017-12-04 DIAGNOSIS — I509 Heart failure, unspecified: Secondary | ICD-10-CM | POA: Diagnosis not present

## 2017-12-04 DIAGNOSIS — I110 Hypertensive heart disease with heart failure: Secondary | ICD-10-CM | POA: Diagnosis not present

## 2017-12-04 DIAGNOSIS — E785 Hyperlipidemia, unspecified: Secondary | ICD-10-CM | POA: Diagnosis not present

## 2017-12-04 DIAGNOSIS — R0603 Acute respiratory distress: Secondary | ICD-10-CM | POA: Diagnosis not present

## 2017-12-04 LAB — CBC
HCT: 30.6 % — ABNORMAL LOW (ref 36.0–46.0)
Hemoglobin: 9.9 g/dL — ABNORMAL LOW (ref 12.0–15.0)
MCH: 32 pg (ref 26.0–34.0)
MCHC: 32.4 g/dL (ref 30.0–36.0)
MCV: 99 fL (ref 78.0–100.0)
PLATELETS: 138 10*3/uL — AB (ref 150–400)
RBC: 3.09 MIL/uL — AB (ref 3.87–5.11)
RDW: 14.6 % (ref 11.5–15.5)
WBC: 2.3 10*3/uL — ABNORMAL LOW (ref 4.0–10.5)

## 2017-12-04 LAB — COMPREHENSIVE METABOLIC PANEL
ALT: 20 U/L (ref 14–54)
AST: 29 U/L (ref 15–41)
Albumin: 2.7 g/dL — ABNORMAL LOW (ref 3.5–5.0)
Alkaline Phosphatase: 58 U/L (ref 38–126)
Anion gap: 10 (ref 5–15)
BUN: 27 mg/dL — ABNORMAL HIGH (ref 6–20)
CHLORIDE: 106 mmol/L (ref 101–111)
CO2: 23 mmol/L (ref 22–32)
CREATININE: 1.29 mg/dL — AB (ref 0.44–1.00)
Calcium: 8.3 mg/dL — ABNORMAL LOW (ref 8.9–10.3)
GFR, EST AFRICAN AMERICAN: 41 mL/min — AB (ref >60)
GFR, EST NON AFRICAN AMERICAN: 35 mL/min — AB (ref >60)
Glucose, Bld: 97 mg/dL (ref 65–99)
Potassium: 4 mmol/L (ref 3.5–5.1)
Sodium: 139 mmol/L (ref 135–145)
Total Bilirubin: 0.5 mg/dL (ref 0.3–1.2)
Total Protein: 5.1 g/dL — ABNORMAL LOW (ref 6.5–8.1)

## 2017-12-04 LAB — SAVE SMEAR

## 2017-12-04 MED ORDER — DICLOFENAC SODIUM 1 % TD GEL: 2.0000 g | Freq: Three times a day (TID) | TRANSDERMAL | 0 refills | Status: DC

## 2017-12-04 MED ORDER — LEVOFLOXACIN 250 MG PO TABS: 250.0000 mg | ORAL_TABLET | Freq: Every day | ORAL | 0 refills | Status: DC

## 2017-12-04 MED ORDER — AMLODIPINE BESYLATE 10 MG PO TABS
10.0000 mg | ORAL_TABLET | Freq: Every day | ORAL | Status: DC
  Administered 2017-12-04: 10 mg via ORAL
  Filled 2017-12-04: qty 1

## 2017-12-04 MED ORDER — DICLOFENAC SODIUM 1 % TD GEL
2.0000 g | Freq: Three times a day (TID) | TRANSDERMAL | Status: DC
  Filled 2017-12-04: qty 100

## 2017-12-04 MED ORDER — SODIUM CHLORIDE 0.9 % IV SOLN
INTRAVENOUS | Status: DC
  Administered 2017-12-04: 08:00:00 via INTRAVENOUS

## 2017-12-04 NOTE — Progress Notes (Signed)
SATURATION QUALIFICATIONS: (This note is used to comply with regulatory documentation for home oxygen)  Patient Saturations on Room Air at Rest = 95%  Patient Saturations on Room Air while Ambulating = 96%  Does not need home oxygen.

## 2017-12-04 NOTE — Care Management Note (Signed)
Case Management Note  Patient Details  Name: [name] MRN: [id number] Date of Birth: [birth year]  Subjective/Objective:       Reactive airway disease            PCP:   [name]  Action/Plan: Transition to home today.   Expected Discharge Date:  12/04/17               Expected Discharge Plan:  [place]  In-House Referral:     Discharge planning Services  CM Consult  Post Acute Care Choice:    Choice offered to:  Patient  DME Arranged:    DME Agency:     HH Arranged:  PT, RN [place] Agency:  [place]  Status of Service:  Completed, signed off  If discussed at [place] of Stay Meetings, dates discussed:    Additional Comments:  Sharin Mons, RN 12/04/2017, 2:26 PM

## 2017-12-04 NOTE — Discharge Summary (Addendum)
Discharge Summary  [name] RCV:[id number] DOB: December 09, 1923  PCP: [name], MD  Admit date: 12/02/2017 Discharge date: 12/04/2017  Time spent: 25 minutes   Recommendations for Outpatient Follow-up:  1. Follow up with PCP within a week 2. Take your medications as prescribed   Discharge Diagnoses:  Active Hospital Problems   Diagnosis Date Noted  . Persistent Paroxysmal atrial fibrillation: CHA2DS2-VASc Score 5, on Eliquis 11/01/2013  . Reactive airway disease 01/13/2008  . Hypothyroidism   . Hyperlipidemia   . Hypertensive heart disease     Resolved Hospital Problems  No resolved problems to display.    Discharge Condition: stable  Diet recommendation: resume previous diet   Vitals:   12/04/17 1318 12/04/17 1346  BP: 132/65   Pulse: (!) 59   Resp: 18   Temp: 98 F (36.7 C)   SpO2: 96% 96%    History of present illness:  [name] an 84 y.o.femaleform home with past medical history of chronic atrial fibrillation on eliquis, childhood asthma who presented with persistent cough of 2 weeks duration. Was started on clarithromycin by her PCP and finished a 10 day course. Cough recurred 3 days later. Audible wheezing on presentation. Admitted for reactive airway disease.  Received round the clock breathing treatments and aggressive pulmonary toilet with improvement of her symptoms.  On the day of discharge the patient was hemodynamically stable. She was able to maintain her O2 saturation above 95% with ambulation on ambient air. The patient will need to follow up with her PCP post hospitalization.   Hospital Course:  Active Problems:   Hypothyroidism   Hyperlipidemia   Hypertensive heart disease   Reactive airway disease   Persistent Paroxysmal atrial fibrillation: CHA2DS2-VASc Score 5, on Eliquis  Reactive airway disease, resolved -h/o childhood asthma, no inhaler use for > 80 years -significant second hand smoke exposure--lived w 2ppd husband  x 35 years -12/03/17: added pulmonary toilet, duonebs q6h, hypersaline nebs daily, chest PT, and mucinex 1 tab BID. -No longer requires O2 supplement -po levaquin 250 mg daily day#2/7  Chronic A-fib -Rate controlled -Continue eliquis  HTN -stable -Continue amlodipine and hydralazine   Hypothyroidism -Continue synthroid  Anxiety  -stable  GOUT -stable -Continue uloric  Ambulatory dysfunction -uses walker at home prn -fall precaution -PT evaluate and treat  Pancytopenia -drop in hg and wbc possibly hemodilutional -Follow up with PCP -repeat CBC at PCP's office within a week  Chronic dCHF EF 55-60% (2014) -not in acute exacerbation -caution with IV fluid -strict I&Os -daily weight  GERD -stable -protonix   Procedures:  none  Consultations:  none  Discharge Exam: BP 132/65 (BP Location: Left Arm)   Pulse (!) 59   Temp 98 F (36.7 C) (Oral)   Resp 18   Ht 5\' 2"  (1.575 m)   Wt 61 kg (134 lb 7.7 oz)   SpO2 96%   BMI 24.60 kg/m   General: 84 yo CF WD WN NAD A&O x3  Cardiovascular: IRR no rubs or gallops Respiratory: CTA no wheezes or rales  Discharge Instructions You were cared for by a hospitalist during your hospital stay. If you have any questions about your discharge medications or the care you received while you were in the hospital after you are discharged, you can call the unit and asked to speak with the hospitalist on call if the hospitalist that took care of you is not available. Once you are discharged, your primary care physician will handle any further medical  issues. Please note that NO REFILLS for any discharge medications will be authorized once you are discharged, as it is imperative that you return to your primary care physician (or establish a relationship with a primary care physician if you do not have one) for your aftercare needs so that they can reassess your need for medications and monitor your lab values.   Allergies  as of 12/04/2017      Reactions   Penicillins Swelling   REACTION: nausea, swelling   Xifaxan [rifaximin] Other (See Comments), Rash   unknown   Ambien [zolpidem Tartrate]    " made her crazy "   Amoxicillin    REACTION: unspecified   Ceftin [cefuroxime Axetil] Diarrhea   Codeine Phosphate    REACTION: unspecified   Colchicine    Severe diarrhea   Lipitor [atorvastatin] Other (See Comments)   "makes legs jump all night"      Medication List    STOP taking these medications   carvedilol 12.5 MG tablet Commonly known as:  COREG   clarithromycin 500 MG tablet Commonly known as:  BIAXIN   doxycycline 100 MG tablet Commonly known as:  VIBRA-TABS   HYDROcodone-homatropine 5-1.5 MG/5ML syrup Commonly known as:  HYDROMET   LORazepam 0.5 MG tablet Commonly known as:  ATIVAN     TAKE these medications   acetaminophen 500 MG tablet Commonly known as:  TYLENOL Take 1.5 tablets (750 mg total) by mouth every 6 (six) hours as needed.   amLODipine 5 MG tablet Commonly known as:  NORVASC TAKE 1 TABLET (5 MG TOTAL) BY MOUTH DAILY.   benzonatate 100 MG capsule Commonly known as:  TESSALON Take 1 capsule (100 mg total) by mouth every 8 (eight) hours.   [employer] VITAMIN B12 1000 MCG tablet Generic drug:  cyanocobalamin TAKE 1 TABLET BY MOUTH EVERY DAY   diclofenac sodium 1 % Gel Commonly known as:  VOLTAREN Apply 2 g topically 3 (three) times daily.   ELIQUIS 2.5 MG Tabs tablet Generic drug:  apixaban TAKE 1 TABLET BY MOUTH TWICE A DAY   furosemide 40 MG tablet Commonly known as:  LASIX Take 1 tablet (40 mg total) by mouth daily as needed for fluid (more than 2-3 pounds overnight and/or 5 pounds in a week).   hydrALAZINE 50 MG tablet Commonly known as:  APRESOLINE Take 1 tablet (50 mg total) by mouth 2 (two) times daily.   Hyoscyamine Sulfate SL 0.125 MG Subl Commonly known as:  LEVSIN/SL Take 1-2 tablets by mouth every 4 hours as needed   levofloxacin 250 MG  tablet Commonly known as:  LEVAQUIN Take 1 tablet (250 mg total) by mouth daily for 5 days. Start taking on:  12/05/2017   levothyroxine 88 MCG tablet Commonly known as:  SYNTHROID, LEVOTHROID TAKE 1 TABLET (88 MCG TOTAL) BY MOUTH DAILY BEFORE BREAKFAST.   nitroGLYCERIN 0.4 MG SL tablet Commonly known as:  NITROSTAT Place 1 tablet (0.4 mg total) under the tongue every 5 (five) minutes x 3 doses as needed for chest pain.   omeprazole 20 MG capsule Commonly known as:  PRILOSEC Take 1 capsule (20 mg total) by mouth daily.   temazepam 30 MG capsule Commonly known as:  RESTORIL TAKE ONE CAPSULE BY MOUTH AT BEDTIME *DRUG NOT COVERED*   ULORIC 40 MG tablet Generic drug:  febuxostat Take 1 tablet by mouth daily.      Allergies  Allergen Reactions  . Penicillins Swelling    REACTION: nausea, swelling  . Xifaxan [  Rifaximin] Other (See Comments) and Rash    unknown  . Ambien [Zolpidem Tartrate]     " made her crazy "  . Amoxicillin     REACTION: unspecified  . Ceftin [Cefuroxime Axetil] Diarrhea  . Codeine Phosphate     REACTION: unspecified  . Colchicine     Severe diarrhea  . Lipitor [Atorvastatin] Other (See Comments)    "makes legs jump all night"   Follow-up Information    [name], MD Follow up.   Specialty:  Family Medicine Contact information: [place] [address] [phone]            The results of significant diagnostics from this hospitalization (including imaging, microbiology, ancillary and laboratory) are listed below for reference.    Significant Diagnostic Studies: X-ray Chest Pa And Lateral  Result Date: 12/03/2017 CLINICAL DATA:  Shortness of breath and cough. EXAM: CHEST  2 VIEW COMPARISON:  12/02/2017 and 11/15/2017 and 03/02/2017 FINDINGS: There is chronic cardiomegaly with calcification of the thoracic aorta. Pulmonary vascularity is normal. No infiltrates. Slight blunting of the posterior costophrenic angles  probably represents tiny bilateral effusions. Pacemaker in place. No acute bone abnormality. Moderate hiatal hernia. IMPRESSION: 1. Chronic cardiomegaly. 2. Tiny bilateral pleural effusions. 3. Moderate hiatal hernia. Electronically Signed   By: [name] M.D.   On: 12/03/2017 15:31   Dg Chest 2 View  Result Date: 11/15/2017 CLINICAL DATA:  Cough and congestion. EXAM: CHEST  2 VIEW COMPARISON:  Chest radiograph 03/02/2017. FINDINGS: Multi lead pacer apparatus overlies the left hemithorax. Stable cardiomegaly. Tortuosity and calcification of the thoracic aorta. No consolidative pulmonary opacities. No pleural effusion or pneumothorax. Small hiatal hernia. Thoracic spine degenerative changes. IMPRESSION: No acute cardiopulmonary process. Electronically Signed   By: Lovey Newcomer M.D.   On: 11/15/2017 11:41   Dg Chest Port 1 View  Result Date: 12/02/2017 CLINICAL DATA:  Cough and congestion. Chest pressure and shortness of breath. EXAM: PORTABLE CHEST 1 VIEW COMPARISON:  11/15/2017 FINDINGS: 1045 hours. Lungs are hyperexpanded. The lungs are clear without focal pneumonia, edema, pneumothorax or pleural effusion. The cardio pericardial silhouette is enlarged. Left permanent pacemaker remains in place. The visualized bony structures of the thorax are intact. Telemetry leads overlie the chest. IMPRESSION: Cardiomegaly without acute cardiopulmonary findings. Electronically Signed   By: Misty Stanley M.D.   On: 12/02/2017 10:58    Microbiology: No results found for this or any previous visit (from the past 240 hour(s)).   Labs: Basic Metabolic Panel: Recent Labs  Lab 12/02/17 0906 12/03/17 0308 12/04/17 0526  NA 140 139 139  K 4.6 4.2 4.0  CL 105 105 106  CO2 21* 24 23  GLUCOSE 151* 129* 97  BUN 23* 21* 27*  CREATININE 1.29* 1.13* 1.29*  CALCIUM 9.3 8.6* 8.3*   Liver Function Tests: Recent Labs  Lab 12/02/17 0906 12/04/17 0526  AST 36 29  ALT 24 20  ALKPHOS 84 58  BILITOT 0.8 0.5   PROT 6.9 5.1*  ALBUMIN 4.0 2.7*   No results for input(s): LIPASE, AMYLASE in the last 168 hours. No results for input(s): AMMONIA in the last 168 hours. CBC: Recent Labs  Lab 12/02/17 0906 12/03/17 0308 12/04/17 0526  WBC 5.4 2.8* 2.3*  NEUTROABS 4.5  --   --   HGB 12.1 10.5* 9.9*  HCT 37.4 33.1* 30.6*  MCV 100.0 97.9 99.0  PLT 199 160 138*   Cardiac Enzymes: No results for input(s): CKTOTAL, CKMB, CKMBINDEX, TROPONINI  in the last 168 hours. BNP: BNP (last 3 results) Recent Labs    03/02/17 1525 12/02/17 0930  BNP 630.9* 783.1*    ProBNP (last 3 results) No results for input(s): PROBNP in the last 8760 hours.  CBG: No results for input(s): GLUCAP in the last 168 hours.     Signed:  Kayleen Memos, MD [facility] 12/04/2017, 2:13 PM

## 2017-12-04 NOTE — Discharge Instructions (Signed)

## 2017-12-04 NOTE — Progress Notes (Signed)
Oxygen saturation 95% on room air and during ambulation. Denies shortness of breath.

## 2017-12-04 NOTE — Progress Notes (Addendum)
PT Evaluation Not  Pt admitted with/for cough, SOB and has become deconditioned and weak.  Pt currently limited functionally due to the problems listed below.  (see problems list.)  Pt will benefit from PT to maximize function and safety to be able to get home safely with available assist   12/04/17 1250  PT Visit Information  Last PT Received On 12/04/17  Assistance Needed +1  History of Present Illness pt is a 84 y/o female with pmh significant for afib, ckd4, HTN, and asthma admitted after 2 weeks of cough managed by doxycycline that began to get worse with SOB and dyspnea PTA.  Precautions  Precautions Fall  Home Living  Family/patient expects to be discharged to: Private residence  Living Arrangements Alone  Available Help at Discharge Family;Available PRN/intermittently  Type of Home Other(Comment) (condo)  Home Access Level entry  Home Layout One level  Bathroom Counselling psychologist - 2 wheels;Cane - single point  Prior Function  Level of Independence Independent;Independent with assistive device(s)  Comments In the past 2 weeks, pt found that she needed AD's more often.  Communication  Communication No difficulties  Pain Assessment  Pain Assessment No/denies pain  Cognition  Arousal/Alertness Awake/alert  Behavior During Therapy WFL for tasks assessed/performed  Overall Cognitive Status Within Functional Limits for tasks assessed  Upper Extremity Assessment  Upper Extremity Assessment Overall WFL for tasks assessed  Lower Extremity Assessment  Lower Extremity Assessment Overall WFL for tasks assessed (pt has proximal weaknesses bil, hip flexors and hams at 4/5)  Bed Mobility  General bed mobility comments NT  Transfers  Overall transfer level Needs assistance  Transfers Sit to/from Stand  Sit to Stand Modified independent (Device/Increase time)  Ambulation/Gait  Ambulation/Gait assistance Supervision  Ambulation Distance (Feet) 150  Feet  Assistive device Rolling walker (2 wheeled)  Gait Pattern/deviations Step-through pattern  General Gait Details generally steady with AD, but mildly unsteady with AD  Gait velocity interpretation Below normal speed for age/gender  Balance  Overall balance assessment Needs assistance  Sitting-balance support No upper extremity supported  Sitting balance-Leahy Scale Fair  Standing balance support No upper extremity supported  Standing balance-Leahy Scale Fair  Standardized Balance Assessment  Standardized Balance Assessment  Berg Balance Test  Berg Balance Test  Sit to Stand 4  Standing Unsupported 4  Sitting with Back Unsupported but Feet Supported on Floor or Stool 4  Stand to Sit 3  Transfers 4  Standing Unsupported with Eyes Closed 4  Standing Ubsupported with Feet Together 3  From Standing, Reach Forward with Outstretched Arm 3  From Standing Position, Pick up Object from Floor 3  From Standing Position, Turn to Look Behind Over each Shoulder 3  Turn 360 Degrees 3  Standing Unsupported, Alternately Place Feet on Step/Stool 1  Standing Unsupported, One Foot in Front 2  Standing on One Leg 1  Total Score 42  General Comments  General comments (skin integrity, edema, etc.) even after failing to complete one of the BERG activities, the numbers show that pt is at risk to fall.  PT - End of Session  Activity Tolerance Patient tolerated treatment well  Patient left in chair;with call bell/phone within reach  Nurse Communication Mobility status  PT Assessment  PT Recommendation/Assessment All further PT needs can be met in the next venue of care  PT Visit Diagnosis Unsteadiness on feet (R26.81);Other abnormalities of gait and mobility (R26.89)  PT Problem List Decreased strength;Decreased balance;Decreased mobility;Decreased knowledge of  use of DME  AM-PAC PT "6 Clicks" Daily Activity Outcome Measure  Difficulty turning over in bed (including adjusting bedclothes, sheets and  blankets)? 3  Difficulty moving from lying on back to sitting on the side of the bed?  3  Difficulty sitting down on and standing up from a chair with arms (e.g., wheelchair, bedside commode, etc,.)? 4  Help needed moving to and from a bed to chair (including a wheelchair)? 4  Help needed walking in hospital room? 3  Help needed climbing 3-5 steps with a railing?  3  6 Click Score 20  Mobility G Code  CJ  PT Recommendation  Follow Up Recommendations Home health PT;Supervision - Intermittent  Individuals Consulted  Consulted and Agree with Results and Recommendations Patient  Acute Rehab PT Goals  Patient Stated Goal get home  PT Goal Formulation With patient  PT Time Calculation  PT Start Time (ACUTE ONLY) 1219  PT Stop Time (ACUTE ONLY) 1249  PT Time Calculation (min) (ACUTE ONLY) 30 min  PT General Charges  $$ ACUTE PT VISIT 1 Visit  PT Evaluation  $PT Eval Moderate Complexity 1 Mod  PT Treatments  $Gait Training 8-22 mins   12/04/2017  Donnella Sham, PT [phone]  (pager).

## 2017-12-05 ENCOUNTER — Observation Stay (HOSPITAL_COMMUNITY)
Admission: EM | Admit: 2017-12-05 | Discharge: 2017-12-06 | Disposition: A | Discharge status: Home / Self Care | Payer: Medicare Other | Attending: Internal Medicine | Admitting: Internal Medicine

## 2017-12-05 ENCOUNTER — Telehealth: Payer: Self-pay | Admitting: Cardiology

## 2017-12-05 ENCOUNTER — Encounter (HOSPITAL_COMMUNITY): Payer: Self-pay

## 2017-12-05 ENCOUNTER — Observation Stay (HOSPITAL_BASED_OUTPATIENT_CLINIC_OR_DEPARTMENT_OTHER): Payer: Medicare Other

## 2017-12-05 ENCOUNTER — Emergency Department (HOSPITAL_COMMUNITY): Payer: Medicare Other

## 2017-12-05 ENCOUNTER — Observation Stay (HOSPITAL_COMMUNITY): Payer: Medicare Other

## 2017-12-05 ENCOUNTER — Other Ambulatory Visit: Payer: Self-pay

## 2017-12-05 DIAGNOSIS — N184 Chronic kidney disease, stage 4 (severe): Secondary | ICD-10-CM | POA: Diagnosis not present

## 2017-12-05 DIAGNOSIS — J45909 Unspecified asthma, uncomplicated: Secondary | ICD-10-CM | POA: Insufficient documentation

## 2017-12-05 DIAGNOSIS — I700 Atherosclerosis of aorta: Secondary | ICD-10-CM | POA: Insufficient documentation

## 2017-12-05 DIAGNOSIS — Z950 Presence of cardiac pacemaker: Secondary | ICD-10-CM | POA: Diagnosis present

## 2017-12-05 DIAGNOSIS — I452 Bifascicular block: Secondary | ICD-10-CM | POA: Insufficient documentation

## 2017-12-05 DIAGNOSIS — Z79899 Other long term (current) drug therapy: Secondary | ICD-10-CM | POA: Insufficient documentation

## 2017-12-05 DIAGNOSIS — I5032 Chronic diastolic (congestive) heart failure: Secondary | ICD-10-CM | POA: Diagnosis not present

## 2017-12-05 DIAGNOSIS — I130 Hypertensive heart and chronic kidney disease with heart failure and stage 1 through stage 4 chronic kidney disease, or unspecified chronic kidney disease: Secondary | ICD-10-CM | POA: Diagnosis present

## 2017-12-05 DIAGNOSIS — E039 Hypothyroidism, unspecified: Secondary | ICD-10-CM | POA: Insufficient documentation

## 2017-12-05 DIAGNOSIS — D509 Iron deficiency anemia, unspecified: Secondary | ICD-10-CM | POA: Insufficient documentation

## 2017-12-05 DIAGNOSIS — Z885 Allergy status to narcotic agent status: Secondary | ICD-10-CM | POA: Insufficient documentation

## 2017-12-05 DIAGNOSIS — R0902 Hypoxemia: Secondary | ICD-10-CM | POA: Diagnosis present

## 2017-12-05 DIAGNOSIS — Z881 Allergy status to other antibiotic agents status: Secondary | ICD-10-CM | POA: Insufficient documentation

## 2017-12-05 DIAGNOSIS — F419 Anxiety disorder, unspecified: Secondary | ICD-10-CM | POA: Insufficient documentation

## 2017-12-05 DIAGNOSIS — K589 Irritable bowel syndrome without diarrhea: Secondary | ICD-10-CM | POA: Insufficient documentation

## 2017-12-05 DIAGNOSIS — R0600 Dyspnea, unspecified: Secondary | ICD-10-CM

## 2017-12-05 DIAGNOSIS — R609 Edema, unspecified: Secondary | ICD-10-CM

## 2017-12-05 DIAGNOSIS — E785 Hyperlipidemia, unspecified: Secondary | ICD-10-CM | POA: Insufficient documentation

## 2017-12-05 DIAGNOSIS — Z7901 Long term (current) use of anticoagulants: Secondary | ICD-10-CM

## 2017-12-05 DIAGNOSIS — Z880 Allergy status to penicillin: Secondary | ICD-10-CM | POA: Diagnosis not present

## 2017-12-05 DIAGNOSIS — Z8679 Personal history of other diseases of the circulatory system: Secondary | ICD-10-CM

## 2017-12-05 DIAGNOSIS — I480 Paroxysmal atrial fibrillation: Secondary | ICD-10-CM | POA: Diagnosis not present

## 2017-12-05 DIAGNOSIS — I481 Persistent atrial fibrillation: Secondary | ICD-10-CM | POA: Diagnosis not present

## 2017-12-05 DIAGNOSIS — Z66 Do not resuscitate: Secondary | ICD-10-CM | POA: Diagnosis not present

## 2017-12-05 DIAGNOSIS — I739 Peripheral vascular disease, unspecified: Secondary | ICD-10-CM | POA: Insufficient documentation

## 2017-12-05 DIAGNOSIS — M1990 Unspecified osteoarthritis, unspecified site: Secondary | ICD-10-CM | POA: Diagnosis not present

## 2017-12-05 DIAGNOSIS — I4819 Other persistent atrial fibrillation: Secondary | ICD-10-CM | POA: Diagnosis present

## 2017-12-05 DIAGNOSIS — R0602 Shortness of breath: Secondary | ICD-10-CM

## 2017-12-05 DIAGNOSIS — K449 Diaphragmatic hernia without obstruction or gangrene: Secondary | ICD-10-CM | POA: Insufficient documentation

## 2017-12-05 DIAGNOSIS — M109 Gout, unspecified: Secondary | ICD-10-CM | POA: Insufficient documentation

## 2017-12-05 LAB — I-STAT TROPONIN, ED: TROPONIN I, POC: 0.01 ng/mL (ref 0.00–0.08)

## 2017-12-05 LAB — CBC
HCT: 37.7 % (ref 36.0–46.0)
Hemoglobin: 12.1 g/dL (ref 12.0–15.0)
MCH: 31.7 pg (ref 26.0–34.0)
MCHC: 32.1 g/dL (ref 30.0–36.0)
MCV: 98.7 fL (ref 78.0–100.0)
Platelets: 191 10*3/uL (ref 150–400)
RBC: 3.82 MIL/uL — ABNORMAL LOW (ref 3.87–5.11)
RDW: 14.6 % (ref 11.5–15.5)
WBC: 3.3 10*3/uL — ABNORMAL LOW (ref 4.0–10.5)

## 2017-12-05 LAB — TROPONIN I
Troponin I: <0.03 ng/mL (ref <0.03)
Troponin I: <0.03 ng/mL (ref <0.03)

## 2017-12-05 LAB — BASIC METABOLIC PANEL
Anion gap: 13 (ref 5–15)
BUN: 30 mg/dL — AB (ref 6–20)
CALCIUM: 8.6 mg/dL — AB (ref 8.9–10.3)
CO2: 24 mmol/L (ref 22–32)
CREATININE: 1.38 mg/dL — AB (ref 0.44–1.00)
Chloride: 103 mmol/L (ref 101–111)
GFR calc Af Amer: 37 mL/min — ABNORMAL LOW (ref >60)
GFR, EST NON AFRICAN AMERICAN: 32 mL/min — AB (ref >60)
GLUCOSE: 88 mg/dL (ref 65–99)
Potassium: 3.6 mmol/L (ref 3.5–5.1)
Sodium: 140 mmol/L (ref 135–145)

## 2017-12-05 LAB — BRAIN NATRIURETIC PEPTIDE: B Natriuretic Peptide: 377.5 pg/mL — ABNORMAL HIGH (ref 0.0–100.0)

## 2017-12-05 MED ORDER — FUROSEMIDE 10 MG/ML IJ SOLN
20.0000 mg | Freq: Once | INTRAMUSCULAR | Status: AC
  Administered 2017-12-05: 20 mg via INTRAVENOUS
  Filled 2017-12-05: qty 2

## 2017-12-05 MED ORDER — LEVOTHYROXINE SODIUM 88 MCG PO TABS
88.0000 ug | ORAL_TABLET | Freq: Every day | ORAL | Status: DC
  Administered 2017-12-06: 88 ug via ORAL
  Filled 2017-12-05: qty 1

## 2017-12-05 MED ORDER — ACETAMINOPHEN 325 MG PO TABS: 650.0000 mg | ORAL_TABLET | Freq: Four times a day (QID) | ORAL | Status: DC | PRN

## 2017-12-05 MED ORDER — FUROSEMIDE 10 MG/ML IJ SOLN
10.0000 mg | Freq: Once | INTRAMUSCULAR | Status: AC
  Administered 2017-12-05: 10 mg via INTRAVENOUS
  Filled 2017-12-05: qty 2

## 2017-12-05 MED ORDER — TECHNETIUM TO 99M ALBUMIN AGGREGATED
4.2000 | Freq: Once | INTRAVENOUS | Status: AC | PRN
  Administered 2017-12-05: 4.2 via INTRAVENOUS

## 2017-12-05 MED ORDER — IPRATROPIUM-ALBUTEROL 0.5-2.5 (3) MG/3ML IN SOLN
3.0000 mL | Freq: Four times a day (QID) | RESPIRATORY_TRACT | Status: DC
  Administered 2017-12-05 – 2017-12-06 (×4): 3 mL via RESPIRATORY_TRACT
  Filled 2017-12-05 (×5): qty 3

## 2017-12-05 MED ORDER — SODIUM CHLORIDE 0.9% FLUSH
3.0000 mL | Freq: Two times a day (BID) | INTRAVENOUS | Status: DC
  Administered 2017-12-05 – 2017-12-06 (×2): 3 mL via INTRAVENOUS

## 2017-12-05 MED ORDER — LEVOFLOXACIN 500 MG PO TABS
250.0000 mg | ORAL_TABLET | Freq: Every day | ORAL | Status: DC
  Administered 2017-12-05 – 2017-12-06 (×2): 250 mg via ORAL
  Filled 2017-12-05 (×2): qty 1

## 2017-12-05 MED ORDER — IPRATROPIUM-ALBUTEROL 0.5-2.5 (3) MG/3ML IN SOLN
3.0000 mL | Freq: Once | RESPIRATORY_TRACT | Status: AC
  Administered 2017-12-05: 3 mL via RESPIRATORY_TRACT
  Filled 2017-12-05: qty 3

## 2017-12-05 MED ORDER — APIXABAN 2.5 MG PO TABS
2.5000 mg | ORAL_TABLET | Freq: Two times a day (BID) | ORAL | Status: DC
  Administered 2017-12-05 – 2017-12-06 (×2): 2.5 mg via ORAL
  Filled 2017-12-05 (×2): qty 1

## 2017-12-05 MED ORDER — FEBUXOSTAT 40 MG PO TABS
40.0000 mg | ORAL_TABLET | Freq: Every day | ORAL | Status: DC
  Administered 2017-12-05 – 2017-12-06 (×2): 40 mg via ORAL
  Filled 2017-12-05 (×2): qty 1

## 2017-12-05 MED ORDER — SODIUM CHLORIDE 0.9% FLUSH: 3.0000 mL | INTRAVENOUS | Status: DC | PRN

## 2017-12-05 MED ORDER — ALBUTEROL SULFATE (2.5 MG/3ML) 0.083% IN NEBU
2.5000 mg | INHALATION_SOLUTION | Freq: Once | RESPIRATORY_TRACT | Status: AC
  Administered 2017-12-05: 2.5 mg via RESPIRATORY_TRACT
  Filled 2017-12-05: qty 3

## 2017-12-05 MED ORDER — ONDANSETRON HCL 4 MG/2ML IJ SOLN: 4.0000 mg | Freq: Four times a day (QID) | INTRAMUSCULAR | Status: DC | PRN

## 2017-12-05 MED ORDER — ALBUTEROL SULFATE (2.5 MG/3ML) 0.083% IN NEBU: 2.5000 mg | INHALATION_SOLUTION | RESPIRATORY_TRACT | Status: DC | PRN

## 2017-12-05 MED ORDER — TEMAZEPAM 15 MG PO CAPS
15.0000 mg | ORAL_CAPSULE | Freq: Every evening | ORAL | Status: DC | PRN
  Administered 2017-12-05: 15 mg via ORAL
  Filled 2017-12-05: qty 1

## 2017-12-05 MED ORDER — BENZONATATE 100 MG PO CAPS
100.0000 mg | ORAL_CAPSULE | Freq: Three times a day (TID) | ORAL | Status: DC
  Administered 2017-12-05 – 2017-12-06 (×2): 100 mg via ORAL
  Filled 2017-12-05 (×2): qty 1

## 2017-12-05 MED ORDER — PANTOPRAZOLE SODIUM 40 MG PO TBEC
40.0000 mg | DELAYED_RELEASE_TABLET | Freq: Every day | ORAL | Status: DC
  Administered 2017-12-05 – 2017-12-06 (×2): 40 mg via ORAL
  Filled 2017-12-05 (×2): qty 1

## 2017-12-05 MED ORDER — TECHNETIUM TC 99M DIETHYLENETRIAME-PENTAACETIC ACID
31.8000 | Freq: Once | INTRAVENOUS | Status: AC | PRN
  Administered 2017-12-05: 31.8 via RESPIRATORY_TRACT

## 2017-12-05 MED ORDER — ONDANSETRON HCL 4 MG PO TABS: 4.0000 mg | ORAL_TABLET | Freq: Four times a day (QID) | ORAL | Status: DC | PRN

## 2017-12-05 MED ORDER — FUROSEMIDE 10 MG/ML IJ SOLN: 20.0000 mg | Freq: Once | INTRAMUSCULAR | Status: DC

## 2017-12-05 MED ORDER — ACETAMINOPHEN 650 MG RE SUPP: 650.0000 mg | Freq: Four times a day (QID) | RECTAL | Status: DC | PRN

## 2017-12-05 MED ORDER — SODIUM CHLORIDE 0.9% FLUSH
3.0000 mL | Freq: Two times a day (BID) | INTRAVENOUS | Status: DC
  Administered 2017-12-05: 3 mL via INTRAVENOUS

## 2017-12-05 MED ORDER — SODIUM CHLORIDE 0.9 % IV SOLN: 250.0000 mL | INTRAVENOUS | Status: DC | PRN

## 2017-12-05 MED ORDER — METHYLPREDNISOLONE SODIUM SUCC 125 MG IJ SOLR
125.0000 mg | Freq: Once | INTRAMUSCULAR | Status: AC
  Administered 2017-12-05: 125 mg via INTRAVENOUS
  Filled 2017-12-05: qty 2

## 2017-12-05 MED ORDER — PREDNISONE 50 MG PO TABS
60.0000 mg | ORAL_TABLET | Freq: Every day | ORAL | Status: DC
  Administered 2017-12-06: 60 mg via ORAL
  Filled 2017-12-05: qty 1

## 2017-12-05 NOTE — ED Notes (Signed)
Care handoff to [facility]

## 2017-12-05 NOTE — ED Notes (Signed)
Pt O2 sats at 84% with good waveform. Pt not ambulated due to decreased sats while at rest. MD informed, orders received.

## 2017-12-05 NOTE — ED Notes (Signed)
Hospitalist bedside for admit 

## 2017-12-05 NOTE — Telephone Encounter (Signed)
New message     Patient daughter calling, states patient was told to stop taking carvedilol (COREG) 12.5 MG tablet. Please call .    Pt c/o medication issue:  1. Name of Medication: carvedilol (COREG) 12.5 MG tablet  2. How are you currently taking this medication (dosage and times per day)? As prescribed  3. Are you having a reaction (difficulty breathing--STAT)? NO  4. What is your medication issue? Daughter wants to confirm medication should be stopped.

## 2017-12-05 NOTE — ED Notes (Signed)
PT will be placed on pure wick prior to lasix admin

## 2017-12-05 NOTE — ED Provider Notes (Signed)
[facility] EMER[facility]ENCY DEPARTMENT Provider Note   CSN: [id number] Arrival date & time: 12/05/17  6433     History   Chief Complaint Chief Complaint  Patient presents with  . Shortness of Breath    HPI   Blood pressure (!) 166/69, pulse 65, temperature 97.7 F (36.5 C), temperature source Oral, resp. rate 15, SpO2 93 %.  [name] is a 84 y.o. female with past medical history significant for pacemaker for symptomatic bradycardia, CKD, hypertensive heart disease, paroxysmal A. fib, recently admitted to the hospital for reactive airway disease, was discharged yesterday she was feeling good at discharge, eating and drinking well with  Past Medical History:  Diagnosis Date  . Anemia   . Anxiety   . Arthritis   . Asthma   . Benign positional vertigo   . Cardiac pacemaker in situ 05/01/2015   for syncope & Sx Bradycardia -> Med[employer]onic MRI compatible pacemaker placed Dr. Caryl Comes   . Chronic diarrhea   . Chronic kidney disease, stage IV (severe) ([place])   . Colon polyps   . Diastolic dysfunction, left ventricle 11/03/2013  . Diverticulosis   . Gout   . Hemorrhoids   . Hyperlipidemia   . Hypertensive heart disease    no significant RAS by MRA 2002- (<30% LRAS)   . Hypothyroidism   . IBS (irritable bowel syndrome)   . LBBB (left bundle branch block)- new 11/01/13 11/01/2013  . Paroxysmal atrial fibrillation 11/01/2013   Intermittent through the years and recurrent in December of 2014 treated with amiodarone;; Negative Myoview 10/2013  . Peripheral vascular disease    29-51% LICA, 8-84% RICA by doppler 2009   . Sinus arrest 04/28/2015  . Syncope and collapse 04/28/2015   s/p PPM  . Tubulovillous adenoma 4/07    Patient Active Problem List   Diagnosis Date Noted  . Chronic back pain 07/16/2017  . Iron deficiency anemia 03/24/2017  . IDA (iron deficiency anemia) 03/20/2017  . Dyspnea on exertion 02/21/2017  . Insomnia, persistent 07/26/2015  . Cardiac  pacemaker in situ   . Femoral neck fracture ([place]) 05/01/2015  . Right bundle branch block (RBBB) intermittent  04/28/2015  . Closed left hip fracture ([place])   . Fall   . Syncope, cardiogenic   . Abnormal liver function tests 11/10/2014  . Systolic hypertension, isolated; difficult to control 10/04/2014  . CKD (chronic kidney disease), stage IV ([place]) 07/31/2014  . Gout 06/17/2014  . Edema of both legs 02/09/2014  . Encounter for long-term (current) use of other medications 12/25/2013  . Hypertensive heart/kidney disease w/chronic kidney disease stage IV ([place]) 12/25/2013  . Chest pain 12/03/2013  . Chronic diastolic heart failure ([place]) 12/03/2013  . H/O tachycardia-bradycardia syndrome   . Chronic anticoagulation- Eliquis 11/17/2013  . LBBB (left bundle branch block)- new 11/01/13 11/01/2013  . Persistent Paroxysmal atrial fibrillation: CHA2DS2-VASc Score 5, on Eliquis 11/01/2013  . Peripheral vascular disease   . Benign positional vertigo   . Irritable bowel syndrome 03/22/2009  . Anxiety   . Reactive airway disease 01/13/2008  . Chronic anemia   . Osteoarthritis   . Hypothyroidism   . Hyperlipidemia   . Hypertensive heart disease     Past Surgical History:  Procedure Laterality Date  . APPENDECTOMY    . CATARACT EXTRACTION    . EP IMPLANTABLE DEVICE N/A 05/01/2015   Procedure: Pacemaker Implant;  Surgeon: Deboraha Sprang, MD;  Location: [facility];  Service: Cardiovascular;  Laterality: N/A;  .  HIP ARTHROPLASTY Left 05/01/2015   Procedure: ARTHROPLASTY BIPOLAR HIP (HEMIARTHROPLASTY);  Surgeon: Rod Can, MD;  Location: [facility];  Service: Orthopedics;  Laterality: Left;  . LOW ANTERIOR BOWEL RESECTION  7/08  . [place] SURGERY  2000  . NM MYOVIEW LTD  03/18/2013   Negative for ischemia or infarction. EF 55%.  LOW RISK  . OOPHORECTOMY  1962   right  . squamous cell skin cancer removed    . TRANSTHORACIC ECHOCARDIOGRAM  10/2013, 02/2017   A) EF 55-60%, mild LVH,  elevated bili pressures. Mild aortic valve calcification;; B) Normal LV size and function with mild LVH. Unable to assess diastolic function. Normal PA pressures. Mild MR,Mild AI    OB History    No data available       Home Medications    Prior to Admission medications   Medication Sig Start Date End Date Taking? Authorizing Provider  acetaminophen (TYLENOL) 500 MG tablet Take 1.5 tablets (750 mg total) by mouth every 6 (six) hours as needed. 01/11/17   Mackuen, Courteney Lyn, MD  amLODipine (NORVASC) 5 MG tablet TAKE 1 TABLET (5 MG TOTAL) BY MOUTH DAILY. 03/21/17   [name], MD  benzonatate (TESSALON) 100 MG capsule Take 1 capsule (100 mg total) by mouth every 8 (eight) hours. Patient not taking: Reported on 12/02/2017 11/15/17   Ocie Cornfield T, PA-C  [facility] VITAMIN B12 1000 MCG tablet TAKE 1 TABLET BY MOUTH EVERY DAY 05/02/17   [name], MD  diclofenac sodium (VOLTAREN) 1 % GEL Apply 2 g topically 3 (three) times daily. 12/04/17   Kayleen Memos, DO  ELIQUIS 2.5 MG TABS tablet TAKE 1 TABLET BY MOUTH TWICE A DAY 07/09/17   [name], MD  furosemide (LASIX) 40 MG tablet Take 1 tablet (40 mg total) by mouth daily as needed for fluid (more than 2-3 pounds overnight and/or 5 pounds in a week). 01/27/17   [name], MD  hydrALAZINE (APRESOLINE) 50 MG tablet Take 1 tablet (50 mg total) by mouth 2 (two) times daily. 04/14/17 12/02/17  [name], MD  Hyoscyamine Sulfate SL (LEVSIN/SL) 0.125 MG SUBL Take 1-2 tablets by mouth every 4 hours as needed 05/05/17   Ladene Artist, MD  levofloxacin (LEVAQUIN) 250 MG tablet Take 1 tablet (250 mg total) by mouth daily for 5 days. 12/05/17 12/10/17  Kayleen Memos, DO  levothyroxine (SYNTHROID, LEVOTHROID) 88 MCG tablet TAKE 1 TABLET (88 MCG TOTAL) BY MOUTH DAILY BEFORE BREAKFAST. 05/16/17   [name], MD  nitroGLYCERIN (NITROSTAT) 0.4 MG SL tablet Place 1 tablet (0.4 mg total) under the tongue every 5 (five) minutes x 3 doses as  needed for chest pain. 02/27/16   [name], MD  omeprazole (PRILOSEC) 20 MG capsule Take 1 capsule (20 mg total) by mouth daily. 05/05/17   Ladene Artist, MD  temazepam (RESTORIL) 30 MG capsule TAKE ONE CAPSULE BY MOUTH AT BEDTIME *DRUG NOT COVERED* 06/20/17   [name], MD  ULORIC 40 MG tablet Take 1 tablet by mouth daily. 03/12/16   [provider]    Family History Family History  Problem Relation Age of Onset  . Cancer Mother 23       unknown  . Heart attack Father   . Heart disease Brother   . Stroke Maternal Grandmother   . Lung cancer Maternal Grandfather   . Stroke Paternal Grandmother   . Colon cancer Daughter  Social History Social History   Tobacco Use  . Smoking status: Never Smoker  . Smokeless tobacco: Never Used  . Tobacco comment: never used tobacco  Substance Use Topics  . Alcohol use: No    Alcohol/week: 0.0 oz  . Drug use: No     Allergies   Penicillins; Xifaxan [rifaximin]; Ambien [zolpidem tartrate]; Amoxicillin; Ceftin [cefuroxime axetil]; Codeine phosphate; Colchicine; and Lipitor [atorvastatin]   Review of Systems Review of Systems  A complete review of systems was obtained and all systems are negative except as noted in the HPI and PMH.    Physical Exam Updated Vital Signs BP (!) 153/67   Pulse (!) 59   Temp 97.7 F (36.5 C) (Oral)   Resp 17   SpO2 (!) 83%   Physical Exam  Constitutional: She is oriented to person, place, and time. She appears well-developed and well-nourished. No distress.  HENT:  Head: Normocephalic and atraumatic.  Mouth/Throat: Oropharynx is clear and moist.  Eyes: Conjunctivae and EOM are normal. Pupils are equal, round, and reactive to light.  Neck: Normal range of motion.  Cardiovascular: Normal rate, regular rhythm and intact distal pulses.  Pulmonary/Chest: Effort normal. No stridor. She has wheezes. She has no rales. She exhibits no tenderness.  Mild wheezing in the left lower lung  field  Abdominal: Soft. There is no tenderness.  Musculoskeletal: Normal range of motion.  Significant peripheral edema bilaterally  Neurological: She is alert and oriented to person, place, and time.  Skin: She is not diaphoretic.  Psychiatric: She has a normal mood and affect.  Nursing note and vitals reviewed.    ED Treatments / Results  Labs (all labs ordered are listed, but only abnormal results are displayed) Labs Reviewed  BASIC METABOLIC PANEL - Abnormal; Notable for the following components:      Result Value   BUN 30 (*)    Creatinine, Ser 1.38 (*)    Calcium 8.6 (*)    GFR calc non Af Amer 32 (*)    GFR calc Af Amer 37 (*)    All other components within normal limits  CBC - Abnormal; Notable for the following components:   WBC 3.3 (*)    RBC 3.82 (*)    All other components within normal limits  BRAIN NATRIURETIC PEPTIDE - Abnormal; Notable for the following components:   B Natriuretic Peptide 377.5 (*)    All other components within normal limits  I-STAT TROPONIN, ED    EKG  EKG Interpretation  Date/Time:  Friday December 05 2017 07:42:39 EST Ventricular Rate:  64 PR Interval:    QRS Duration: 160 QT Interval:  454 QTC Calculation: 468 R Axis:   -81 Text Interpretation:  Ventricular-paced rhythm Abnormal ECG Confirmed by Milton Ferguson [phone]) on 12/05/2017 1:20:17 PM       Radiology Dg Chest 2 View  Result Date: 12/05/2017 CLINICAL DATA:  Shortness of breath for 3-4 weeks. EXAM: CHEST  2 VIEW COMPARISON:  Two-view chest x-ray 12/03/2017 FINDINGS: The heart is enlarged. Mild pulmonary vascular congestion is present. Pacing wires are stable. Atherosclerotic calcifications are present at the aortic arch. There is no edema or effusion. No focal airspace consolidation is present. Degenerative changes of the thoracic spine are stable. IMPRESSION: 1. Stable cardiomegaly. 2. Mild pulmonary vascular congestion without frank edema. Electronically Signed   By:  San Morelle M.D.   On: 12/05/2017 08:45   X-ray Chest Pa And Lateral  Result Date: 12/03/2017 CLINICAL DATA:  Shortness of breath  and cough. EXAM: CHEST  2 VIEW COMPARISON:  12/02/2017 and 11/15/2017 and 03/02/2017 FINDINGS: There is chronic cardiomegaly with calcification of the thoracic aorta. Pulmonary vascularity is normal. No infiltrates. Slight blunting of the posterior costophrenic angles probably represents tiny bilateral effusions. Pacemaker in place. No acute bone abnormality. Moderate hiatal hernia. IMPRESSION: 1. Chronic cardiomegaly. 2. Tiny bilateral pleural effusions. 3. Moderate hiatal hernia. Electronically Signed   By: [name] M.D.   On: 12/03/2017 15:31    Procedures Procedures (including critical care time)  Medications Ordered in ED Medications  methylPREDNISolone sodium succinate (SOLU-MEDROL) 125 mg/2 mL injection 125 mg (not administered)  ipratropium-albuterol (DUONEB) 0.5-2.5 (3) MG/3ML nebulizer solution 3 mL (not administered)  albuterol (PROVENTIL) (2.5 MG/3ML) 0.083% nebulizer solution 2.5 mg (2.5 mg Nebulization Given 12/05/17 0807)  ipratropium-albuterol (DUONEB) 0.5-2.5 (3) MG/3ML nebulizer solution 3 mL (3 mLs Nebulization Given 12/05/17 1055)  furosemide (LASIX) injection 10 mg (10 mg Intravenous Given 12/05/17 1127)     Initial Impression / Assessment and Plan / ED Course  I have reviewed the triage vital signs and the nursing notes.  Pertinent labs & imaging results that were available during my care of the patient were reviewed by me and considered in my medical decision making (see chart for details).     Vitals:   12/05/17 1100 12/05/17 1115 12/05/17 1145 12/05/17 1230  BP: (!) 177/73 (!) 171/71 (!) 157/68 (!) 153/67  Pulse: 60 (!) 59 61 (!) 59  Resp: 11 16 13 17   Temp:      TempSrc:      SpO2: 98% 99% 92% (!) 83%    Medications  methylPREDNISolone sodium succinate (SOLU-MEDROL) 125 mg/2 mL injection 125 mg (not administered)    ipratropium-albuterol (DUONEB) 0.5-2.5 (3) MG/3ML nebulizer solution 3 mL (not administered)  albuterol (PROVENTIL) (2.5 MG/3ML) 0.083% nebulizer solution 2.5 mg (2.5 mg Nebulization Given 12/05/17 0807)  ipratropium-albuterol (DUONEB) 0.5-2.5 (3) MG/3ML nebulizer solution 3 mL (3 mLs Nebulization Given 12/05/17 1055)  furosemide (LASIX) injection 10 mg (10 mg Intravenous Given 12/05/17 1127)    KEYASIA JOLLIFF is 84 y.o. female presenting with cough, shortness of breath worsening over the last 12 hours.  She was seen for an admitted for similar several days ago, just discharged yesterday.  She states she was feeling well initially after leaving the hospital but over the course of the night she continued to cough and was up all night with shortness of breath in addition to cough.  Lung sounds with focal wheezing in the left lung field, mild no stridor.  Chest x-ray with possible interstitial edema.  No significant peripheral edema.  Patient will be given 10 mg of Lasix for likely multifactorial shortness of breath and hypoxia.  She will be given a DuoNeb as well.  Attempted to DC oxygen to ambulate the patient however her oxygen saturation levels went down to 83% on room air with good waveform, need admission for continued shortness of breath/hypoxia.     Final Clinical Impressions(s) / ED Diagnoses   Final diagnoses:  Hypoxia  SOB (shortness of breath)    ED Discharge Orders    None       Karen Kays Charna Elizabeth 12/05/17 1345    Milton Ferguson, MD 12/05/17 1555

## 2017-12-05 NOTE — ED Notes (Signed)
ED Provider at bedside. 

## 2017-12-05 NOTE — Telephone Encounter (Signed)
Spoke with daughter. Her mother wanted her to call in to confirm that coreg was supposed to be stopped. Advised it was discontinued after hospital visit.

## 2017-12-05 NOTE — ED Notes (Signed)
While pt was resting, O2 sats dropped to 82%, pt placed on 4L and brought to 92%

## 2017-12-05 NOTE — ED Notes (Signed)
Heart Healthy Diet has been ordered for [employer]Marland Kitchen

## 2017-12-05 NOTE — ED Triage Notes (Signed)
Pt reports being admitted for 2 days and dc home yesterday d/t sob and cough. PT reports she was treated with around the clock breathing tx and steroids and dc home with levaquin but really unsure of what was going home because her dc papers were about heart failure. Pt daughter also concerned d/t 1 of BP medications being dc prior to discharge.   PT daughter states she woke up at 0300 extremely SOB and wheezing and coughing. PT denies CP

## 2017-12-05 NOTE — Progress Notes (Signed)
LE venous duplex prelim: negative for DVT. Challis Crill Eunice, RDMS, RVT  

## 2017-12-05 NOTE — Telephone Encounter (Signed)
LMTCB  Per chart review, patient was discharged 12/04/17 and coreg was stopped.

## 2017-12-05 NOTE — H&P (Signed)
[facility] History and Physical  [name]:[id number] DOB: November 30, 1923 DOA: 12/05/2017   PCP: [name], MD  Specialists: Dr. Ellyn Hack is her cardiologist.  Dr. Caryl Comes is her electrophysiologist.  Also followed by Dr. Marin Olp for iron deficiency anemia.  Chief Complaint: Shortness of breath  HPI: [name] is a 84 y.o. female with past medical history of atrial fibrillation, pacemaker placement for tachybradycardia syndrome, history of vertigo, CKD stage IV was hospitalized on 12/02/17 for reactive airway disease.  Patient was treated on the hospital with steroids, antibiotics and nebulizer treatments.  She started improving.  She was ambulated on 1/24 with physical therapy.  She did not have any hypoxia at that time.  She was discharged home.  She was doing well and then overnight she started coughing again and felt short of breath.  Had some wheezing.  Symptoms did not improve despite sitting up on the recliner.  So this morning she was brought back to the emergency department.  Patient denies any chest pain.  No fever chills since yesterday.  Cough is dry for the most part.  Denies any leg swelling more than usual.  Takes Lasix only as needed.  In the emergency department patient was noted to be hypoxic with saturations in the mid 80s at rest.  She will need hospitalization.  Home Medications: Prior to Admission medications   Medication Sig Start Date End Date Taking? Authorizing Provider  acetaminophen (TYLENOL) 500 MG tablet Take 1.5 tablets (750 mg total) by mouth every 6 (six) hours as needed. 01/11/17   Mackuen, Courteney Lyn, MD  amLODipine (NORVASC) 5 MG tablet TAKE 1 TABLET (5 MG TOTAL) BY MOUTH DAILY. 03/21/17   [name], MD  benzonatate (TESSALON) 100 MG capsule Take 1 capsule (100 mg total) by mouth every 8 (eight) hours. Patient not taking: Reported on 12/02/2017 11/15/17   Ocie Cornfield T, PA-C  [facility] VITAMIN B12 1000 MCG tablet TAKE 1 TABLET BY MOUTH  EVERY DAY 05/02/17   [name], MD  diclofenac sodium (VOLTAREN) 1 % GEL Apply 2 g topically 3 (three) times daily. 12/04/17   Kayleen Memos, DO  ELIQUIS 2.5 MG TABS tablet TAKE 1 TABLET BY MOUTH TWICE A DAY 07/09/17   [name], MD  furosemide (LASIX) 40 MG tablet Take 1 tablet (40 mg total) by mouth daily as needed for fluid (more than 2-3 pounds overnight and/or 5 pounds in a week). 01/27/17   [name], MD  hydrALAZINE (APRESOLINE) 50 MG tablet Take 1 tablet (50 mg total) by mouth 2 (two) times daily. 04/14/17 12/02/17  [name], MD  Hyoscyamine Sulfate SL (LEVSIN/SL) 0.125 MG SUBL Take 1-2 tablets by mouth every 4 hours as needed 05/05/17   Ladene Artist, MD  levofloxacin (LEVAQUIN) 250 MG tablet Take 1 tablet (250 mg total) by mouth daily for 5 days. 12/05/17 12/10/17  Kayleen Memos, DO  levothyroxine (SYNTHROID, LEVOTHROID) 88 MCG tablet TAKE 1 TABLET (88 MCG TOTAL) BY MOUTH DAILY BEFORE BREAKFAST. 05/16/17   [name], MD  nitroGLYCERIN (NITROSTAT) 0.4 MG SL tablet Place 1 tablet (0.4 mg total) under the tongue every 5 (five) minutes x 3 doses as needed for chest pain. 02/27/16   [name], MD  omeprazole (PRILOSEC) 20 MG capsule Take 1 capsule (20 mg total) by mouth daily. 05/05/17   Ladene Artist, MD  temazepam (RESTORIL) 30 MG capsule TAKE ONE CAPSULE BY MOUTH AT BEDTIME *DRUG  NOT COVERED* 06/20/17   [name], MD  ULORIC 40 MG tablet Take 1 tablet by mouth daily. 03/12/16   [provider]    Allergies:  Allergies  Allergen Reactions  . Penicillins Swelling    REACTION: nausea, swelling  . Xifaxan [Rifaximin] Other (See Comments) and Rash    unknown  . Ambien [Zolpidem Tartrate]     " made her crazy "  . Amoxicillin     REACTION: unspecified  . Ceftin [Cefuroxime Axetil] Diarrhea  . Codeine Phosphate     REACTION: unspecified  . Colchicine     Severe diarrhea  . Lipitor [Atorvastatin] Other (See Comments)    "makes legs jump all  night"    Past Medical History: Past Medical History:  Diagnosis Date  . Anemia   . Anxiety   . Arthritis   . Asthma   . Benign positional vertigo   . Cardiac pacemaker in situ 05/01/2015   for syncope & Sx Bradycardia -> Medtronic MRI compatible pacemaker placed Dr. Caryl Comes   . Chronic diarrhea   . Chronic kidney disease, stage IV (severe) ([place])   . Colon polyps   . Diastolic dysfunction, left ventricle 11/03/2013  . Diverticulosis   . Gout   . Hemorrhoids   . Hyperlipidemia   . Hypertensive heart disease    no significant RAS by MRA 2002- (<30% LRAS)   . Hypothyroidism   . IBS (irritable bowel syndrome)   . LBBB (left bundle branch block)- new 11/01/13 11/01/2013  . Paroxysmal atrial fibrillation 11/01/2013   Intermittent through the years and recurrent in December of 2014 treated with amiodarone;; Negative Myoview 10/2013  . Peripheral vascular disease    39-76% LICA, 7-34% RICA by doppler 2009   . Sinus arrest 04/28/2015  . Syncope and collapse 04/28/2015   s/p PPM  . Tubulovillous adenoma 4/07    Past Surgical History:  Procedure Laterality Date  . APPENDECTOMY    . CATARACT EXTRACTION    . EP IMPLANTABLE DEVICE N/A 05/01/2015   Procedure: Pacemaker Implant;  Surgeon: Deboraha Sprang, MD;  Location: [facility];  Service: Cardiovascular;  Laterality: N/A;  . HIP ARTHROPLASTY Left 05/01/2015   Procedure: ARTHROPLASTY BIPOLAR HIP (HEMIARTHROPLASTY);  Surgeon: Rod Can, MD;  Location: [facility];  Service: Orthopedics;  Laterality: Left;  . LOW ANTERIOR BOWEL RESECTION  7/08  . [place] SURGERY  2000  . NM MYOVIEW LTD  03/18/2013   Negative for ischemia or infarction. EF 55%.  LOW RISK  . OOPHORECTOMY  1962   right  . squamous cell skin cancer removed    . TRANSTHORACIC ECHOCARDIOGRAM  10/2013, 02/2017   A) EF 55-60%, mild LVH, elevated bili pressures. Mild aortic valve calcification;; B) Normal LV size and function with mild LVH. Unable to assess diastolic  function. Normal PA pressures. Mild MR,Mild AI    Social History: Lives by herself but has family members who provide support.  No history of smoking.  But has been exposed to passive smoking previously.   Family History:  Family History  Problem Relation Age of Onset  . Cancer Mother 72       unknown  . Heart attack Father   . Heart disease Brother   . Stroke Maternal Grandmother   . Lung cancer Maternal Grandfather   . Stroke Paternal Grandmother   . Colon cancer Daughter      Review of Systems - History obtained from the patient General ROS: positive for  -  fatigue Psychological ROS: negative Ophthalmic ROS: negative ENT ROS: negative Allergy and Immunology ROS: negative Hematological and Lymphatic ROS: negative Endocrine ROS: negative Respiratory ROS: As in HPI Cardiovascular ROS: As in HPI Gastrointestinal ROS: no abdominal pain, change in bowel habits, or black or bloody stools Genito-Urinary ROS: no dysuria, trouble voiding, or hematuria Musculoskeletal ROS: negative Neurological ROS: no TIA or stroke symptoms Dermatological ROS: negative  Physical Examination  Vitals:   12/05/17 1230 12/05/17 1315 12/05/17 1415 12/05/17 1445  BP: (!) 153/67 (!) 167/67 (!) 177/72 (!) 167/70  Pulse: (!) 59 (!) 59 63 62  Resp: 17 15 10 15   Temp:      TempSrc:      SpO2: (!) 83% (!) 86% 95% 94%    BP (!) 167/70   Pulse 62   Temp 97.7 F (36.5 C) (Oral)   Resp 15   SpO2 94%   General appearance: alert, cooperative, appears stated age and no distress Head: Normocephalic, without obvious abnormality, atraumatic Eyes: conjunctivae/corneas clear. PERRL, EOM's intact.  Throat: lips, mucosa, and tongue normal; teeth and gums normal Neck: no adenopathy, no carotid bruit, no JVD, supple, symmetrical, trachea midline and thyroid not enlarged, symmetric, no tenderness/mass/nodules Resp: Coarse breath sounds bilaterally.  Few crackles at the bases.  But no wheezing is appreciated.   She did receive nebulizer treatments in the emergency department. Cardio: regular rate and rhythm, S1, S2 normal, no murmur, click, rub or gallop GI: soft, non-tender; bowel sounds normal; no masses,  no organomegaly Extremities: Minimal edema noted bilateral lower extremities right more than left which is chronic per patient. Pulses: 2+ and symmetric Skin: Skin color, texture, turgor normal. No rashes or lesions Lymph nodes: Cervical, supraclavicular, and axillary nodes normal. Neurologic: Alert and oriented x3.  Cranial nerves II through XII intact.  Motor strength equal bilateral upper and lower extremities.   Labs on Admission: I have personally reviewed following labs and imaging studies  CBC: Recent Labs  Lab 12/02/17 0906 12/03/17 0308 12/04/17 0526 12/05/17 0801  WBC 5.4 2.8* 2.3* 3.3*  NEUTROABS 4.5  --   --   --   HGB 12.1 10.5* 9.9* 12.1  HCT 37.4 33.1* 30.6* 37.7  MCV 100.0 97.9 99.0 98.7  PLT 199 160 138* 626   Basic Metabolic Panel: Recent Labs  Lab 12/02/17 0906 12/03/17 0308 12/04/17 0526 12/05/17 0801  NA 140 139 139 140  K 4.6 4.2 4.0 3.6  CL 105 105 106 103  CO2 21* 24 23 24   GLUCOSE 151* 129* 97 88  BUN 23* 21* 27* 30*  CREATININE 1.29* 1.13* 1.29* 1.38*  CALCIUM 9.3 8.6* 8.3* 8.6*   GFR: Estimated Creatinine Clearance: 22.8 mL/min (A) (by C-G formula based on SCr of 1.38 mg/dL (H)). Liver Function Tests: Recent Labs  Lab 12/02/17 0906 12/04/17 0526  AST 36 29  ALT 24 20  ALKPHOS 84 58  BILITOT 0.8 0.5  PROT 6.9 5.1*  ALBUMIN 4.0 2.7*    Radiological Exams on Admission: Dg Chest 2 View  Result Date: 12/05/2017 CLINICAL DATA:  Shortness of breath for 3-4 weeks. EXAM: CHEST  2 VIEW COMPARISON:  Two-view chest x-ray 12/03/2017 FINDINGS: The heart is enlarged. Mild pulmonary vascular congestion is present. Pacing wires are stable. Atherosclerotic calcifications are present at the aortic arch. There is no edema or effusion. No focal airspace  consolidation is present. Degenerative changes of the thoracic spine are stable. IMPRESSION: 1. Stable cardiomegaly. 2. Mild pulmonary vascular congestion without frank edema. Electronically  Signed   By: San Morelle M.D.   On: 12/05/2017 08:45   X-ray Chest Pa And Lateral  Result Date: 12/03/2017 CLINICAL DATA:  Shortness of breath and cough. EXAM: CHEST  2 VIEW COMPARISON:  12/02/2017 and 11/15/2017 and 03/02/2017 FINDINGS: There is chronic cardiomegaly with calcification of the thoracic aorta. Pulmonary vascularity is normal. No infiltrates. Slight blunting of the posterior costophrenic angles probably represents tiny bilateral effusions. Pacemaker in place. No acute bone abnormality. Moderate hiatal hernia. IMPRESSION: 1. Chronic cardiomegaly. 2. Tiny bilateral pleural effusions. 3. Moderate hiatal hernia. Electronically Signed   By: [name] M.D.   On: 12/03/2017 15:31    My interpretation of Electrocardiogram: Paced rhythm is noted as before.   Problem List  Principal Problem:   Dyspnea Active Problems:   Persistent Paroxysmal atrial fibrillation: CHA2DS2-VASc Score 5, on Eliquis   Chronic anticoagulation- Eliquis   Chronic diastolic heart failure (HCC)   H/O tachycardia-bradycardia syndrome   CKD (chronic kidney disease), stage IV (HCC)   Cardiac pacemaker in situ   Hypoxia   Assessment: This is a 84 year old Caucasian female who has past medical history as stated earlier who was discharged on 1/24 after being managed for what appears to be acute bronchitis.  Patient however started developing shortness of breath and cough overnight.  She was noted to be hypoxic in the emergency department.  She will need hospitalization.  Plan: #1 persistent dyspnea with hypoxia: Chest x-ray was surprisingly unremarkable.  It is quite possible patient Lindsey have developed mild fluid overload.  Persistent bronchitis is also a consideration.  She was treated with steroids in the hospital  but not continued at home which could have resulted in recurrence of her symptoms.  Venous thromboembolism is a possibility although she is noted to be on Eliquis.  We will proceed with VQ scan.  Proceed with lower extremity Doppler study.  We will give her steroids, nebulizer rxs.  Continue her Levaquin.  We will also give her Lasix.  BNP noted to be 377.5. Check her weight as it is quite possible she Lindsey have gained some since her hospital stay.  Leave her on oxygen for now.  Recheck o2 saturation on room air tomorrow.  Patient Lindsey need home oxygen.  #2  History of paroxysmal atrial fibrillation with chads 2 vascular score of 5.  She is on Eliquis.  Has been on carvedilol at home.  During her hospitalization this was held possibly due to the fact that she had was wheezing.  Heart rate noted to be in the 50s and 60s during that stay but she has a pacer.  Continue to hold for tonight.  Monitor on telemetry.  #3 pacemaker in situ: Stable.  #4  Chronic kidney disease stage IV: Baseline creatinine around 1.4-1.6.  Seems to be close to baseline.  Monitor urine output.  Repeat labs tomorrow.  #5 history of essential hypertension: Blood pressure noted to be borderline low.  Hold her antihypertensives for now.  #6 History of hypothyroidism: Continue levothyroxine.  #7 History of gout: Continue Uloric.  #8 Possible Acute on chronic diastolic CHF: Unclear if there is any acute exacerbation.  No overt pulmonary edema noted on x-ray or on physical exam.  She does have some lower extremity edema.  Her weight was 61 kg 3 days ago.  We will need to recheck her weight.  She has been given 10 mg of Lasix IV in the emergency department.  We will give her another 20 mg this  evening.  Strict ins and outs.  Daily weights.   DVT Prophylaxis: On Eliquis Code Status: DNR Family Communication: Discussed with the patient Consults called: None  Severity of Illness: The appropriate patient status for this patient is  OBSERVATION. Observation status is judged to be reasonable and necessary in order to provide the required intensity of service to ensure the patient's safety. The patient's presenting symptoms, physical exam findings, and initial radiographic and laboratory data in the context of their medical condition is felt to place them at decreased risk for further clinical deterioration. Furthermore, it is anticipated that the patient will be medically stable for discharge from the hospital within 2 midnights of admission. The following factors support the patient status of observation.   " The patient's presenting symptoms include shortness of breath. " The physical exam findings include hypoxia. " The initial radiographic and laboratory data are concerning for fluid overload versus persistent bronchitis.  Further management decisions will depend on results of further testing and patient's response to treatment.   Bonnielee Haff  [facility] Pager [phone]  If 7PM-7AM, please contact night-coverage [email or web address] Password [email or web address]  12/05/2017, 3:12 PM

## 2017-12-06 DIAGNOSIS — R0600 Dyspnea, unspecified: Secondary | ICD-10-CM | POA: Diagnosis not present

## 2017-12-06 LAB — BASIC METABOLIC PANEL
ANION GAP: 12 (ref 5–15)
BUN: 34 mg/dL — AB (ref 6–20)
CHLORIDE: 100 mmol/L — AB (ref 101–111)
CO2: 28 mmol/L (ref 22–32)
Calcium: 8.4 mg/dL — ABNORMAL LOW (ref 8.9–10.3)
Creatinine, Ser: 1.53 mg/dL — ABNORMAL HIGH (ref 0.44–1.00)
GFR calc Af Amer: 33 mL/min — ABNORMAL LOW (ref >60)
GFR calc non Af Amer: 29 mL/min — ABNORMAL LOW (ref >60)
GLUCOSE: 151 mg/dL — AB (ref 65–99)
POTASSIUM: 3.9 mmol/L (ref 3.5–5.1)
SODIUM: 140 mmol/L (ref 135–145)

## 2017-12-06 LAB — CBC
HEMATOCRIT: 36 % (ref 36.0–46.0)
HEMOGLOBIN: 11.8 g/dL — AB (ref 12.0–15.0)
MCH: 31.5 pg (ref 26.0–34.0)
MCHC: 32.8 g/dL (ref 30.0–36.0)
MCV: 96 fL (ref 78.0–100.0)
Platelets: 179 10*3/uL (ref 150–400)
RBC: 3.75 MIL/uL — ABNORMAL LOW (ref 3.87–5.11)
RDW: 13.8 % (ref 11.5–15.5)
WBC: 1.6 10*3/uL — AB (ref 4.0–10.5)

## 2017-12-06 MED ORDER — BENZONATATE 100 MG PO CAPS: 100.0000 mg | ORAL_CAPSULE | Freq: Three times a day (TID) | ORAL | 0 refills | Status: DC

## 2017-12-06 MED ORDER — FUROSEMIDE 40 MG PO TABS: ORAL_TABLET | ORAL | 3 refills | Status: DC

## 2017-12-06 MED ORDER — PREDNISONE 20 MG PO TABS: ORAL_TABLET | ORAL | 0 refills | Status: DC

## 2017-12-06 MED ORDER — UNABLE TO FIND: 0 refills | Status: DC

## 2017-12-06 MED ORDER — IPRATROPIUM-ALBUTEROL 0.5-2.5 (3) MG/3ML IN SOLN: 3.0000 mL | Freq: Three times a day (TID) | RESPIRATORY_TRACT | 0 refills | Status: DC

## 2017-12-06 NOTE — Progress Notes (Signed)
[name] to be D/C'd Home per MD order.  Discussed with the patient and all questions fully answered.  VSS, Skin clean, dry and intact without evidence of skin break down, no evidence of skin tears noted. IV catheter discontinued intact. Site without signs and symptoms of complications. Dressing and pressure applied.  An After Visit Summary was printed and given to the patient. Patient received prescription.  D/c education completed with patient/family including follow up instructions, medication list, d/c activities limitations if indicated, with other d/c instructions as indicated by MD - patient able to verbalize understanding, all questions fully answered.   Patient instructed to return to ED, call 911, or call MD for any changes in condition.   Patient escorted via [place], and D/C home via private auto.  Holley Raring 12/06/2017 4:20 PM

## 2017-12-06 NOTE — Care Management Note (Signed)
Case Management Note  Patient Details  Name: [name] MRN: [id number] Date of Birth: March 09, 1924  Subjective/Objective:                 Patient active w [facility]. Referral to [facility] complete. Requested nebulizer be brought to room prior to DC.    Action/Plan:   Expected Discharge Date:  12/06/17               Expected Discharge Plan:  [place]  In-House Referral:     Discharge planning Services  CM Consult  Post Acute Care Choice:  Home Health, Resumption of Svcs/PTA Provider Choice offered to:  Patient  DME Arranged:  Nebulizer/meds DME Agency:  [facility] Arranged:  RN, PT [facility] Agency:  [facility]  Status of Service:  Completed, signed off  If discussed at [place] of Stay Meetings, dates discussed:    Additional Comments:  Carles Collet, RN 12/06/2017, 1:23 PM

## 2017-12-06 NOTE — Discharge Summary (Signed)
[facility]  Physician Discharge Summary   Patient ID: [name] MRN: [id number] DOB/AGE: 1924-04-25 84 y.o.  Admit date: 12/05/2017 Discharge date: 12/06/2017  PCP: [name], MD  DISCHARGE DIAGNOSES:  Principal Problem:   Dyspnea Active Problems:   Persistent Paroxysmal atrial fibrillation: CHA2DS2-VASc Score 5, on Eliquis   Chronic anticoagulation- Eliquis   Chronic diastolic heart failure (HCC)   H/O tachycardia-bradycardia syndrome   CKD (chronic kidney disease), stage IV (HCC)   Cardiac pacemaker in situ   Hypoxia   RECOMMENDATIONS FOR OUTPATIENT FOLLOW UP: 1. Outpatient follow-up with PCP. 2. Recommend repeating renal function panel in 1-2 weeks. 3. Carvedilol held due to bradycardia.  DISCHARGE CONDITION: fair  Diet recommendation: As before  [facility] Weights   12/05/17 1650 12/06/17 0603  Weight: 63.6 kg (140 lb 3.4 oz) 62.1 kg (136 lb 14.5 oz)    INITIAL HISTORY: 84 y.o. female with past medical history of atrial fibrillation, pacemaker placement for tachybradycardia syndrome, history of vertigo, CKD stage IV was hospitalized on 12/02/17 for reactive airway disease.  Patient was treated on the hospital with steroids, antibiotics and nebulizer treatments.  She started improving.  She was ambulated on 1/24 with physical therapy.  She did not have any hypoxia at that time.  She was discharged home.  She was doing well and then overnight she started coughing again and felt short of breath.  Had some wheezing.  Symptoms did not improve despite sitting up on the recliner. So she was brought back to the emergency department.  Patient denied any chest pain.  No fever chills since discharge. Cough is dry for the most part.  Denied any leg swelling more than usual.  Takes Lasix only as needed.  In the emergency department patient was noted to be hypoxic with saturations in the mid 80s at rest.   Consultations:  None  Procedures: Lower extremity venous  Doppler Negative for DVT   HOSPITAL COURSE:   Persistent dyspnea with hypoxia Differential diagnosis is broad including partially treated reactive airway disease, fluid overload.  Chest x-ray was surprisingly unremarkable.  It is quite possible patient may have developed mild fluid overload.  Persistent bronchitis is also a consideration.  She was treated with steroids in the hospital during her previous hospitalization but not continued at home which could have resulted in recurrence of her symptoms.  Venous thromboembolism was thought to be a possibility although she is noted to be on Eliquis.    VQ scan was done which was low probability.  Lower extremity Doppler studies negative for DVT.  There was also concern for fluid overload even though no obvious pulmonary edema noted on chest x-ray.  Her weight was noted to be higher than her usual baseline by 2-3 kg.  With the intravenous Lasix she has diuresed.  Her weight has decreased.  Symptomatically she feels better this morning.  Did not have any recurrence of symptoms overnight.  She is saturating normal on room air now.  She was ambulated and her saturations did not drop below 93.  She did not have any symptoms with ambulation.  She was also continued on her Levaquin.  She was also given nebulizer treatments and steroids.  Will be discharged with nebulizer machine and medications for same.  Does not need home oxygen at this time.  Long discussion with patient and her family.  Patient feels well enough to go home.  Possible Acute on chronic diastolic CHF Unclear if there is any acute exacerbation.  No overt pulmonary edema noted on x-ray or on physical exam.  She does have some lower extremity edema.    Her weight was noted to have increased from 61 kg at the time of the previous hospitalization to 63 kg this hospitalization.  Patient was given intravenous Lasix with good diuresis and decrease in weight.  She will be asked to take her Lasix on a daily  basis for the next few days.    History of paroxysmal atrial fibrillation with chads 2 vascular score of 5 She is on Eliquis.  Has been on carvedilol at home.  During her hospitalization this was held possibly due to the fact that she had was wheezing.  Also likely held because her heart rate was in the 50s at that time.  She does have a pacemaker.  Continue to hold for now.  Apparently daughter spoke to cardiology office who also recommended holding it for now.  Follow-up with PCP to check heart rate.  She has an appointment with her cardiologist in March.  Pacemaker in situ Stable.  Chronic kidney disease stage IV Baseline creatinine around 1.4-1.6.  Seems to be close to baseline.  Mild increase in creatinine noted today.  But she has good urine output.  Can be monitored in the outpatient setting.  History of essential hypertension Stable.  Continue home medications.  History of hypothyroidism Continue levothyroxine.  History of gout Continue Uloric.  Overall improved.  Seems to be stable for discharge.   PERTINENT LABS:  The results of significant diagnostics from this hospitalization (including imaging, microbiology, ancillary and laboratory) are listed below for reference.     Labs: Basic Metabolic Panel: Recent Labs  Lab 12/02/17 0906 12/03/17 0308 12/04/17 0526 12/05/17 0801 12/06/17 0319  NA 140 139 139 140 140  K 4.6 4.2 4.0 3.6 3.9  CL 105 105 106 103 100*  CO2 21* 24 23 24 28   GLUCOSE 151* 129* 97 88 151*  BUN 23* 21* 27* 30* 34*  CREATININE 1.29* 1.13* 1.29* 1.38* 1.53*  CALCIUM 9.3 8.6* 8.3* 8.6* 8.4*   Liver Function Tests: Recent Labs  Lab 12/02/17 0906 12/04/17 0526  AST 36 29  ALT 24 20  ALKPHOS 84 58  BILITOT 0.8 0.5  PROT 6.9 5.1*  ALBUMIN 4.0 2.7*   CBC: Recent Labs  Lab 12/02/17 0906 12/03/17 0308 12/04/17 0526 12/05/17 0801 12/06/17 0319  WBC 5.4 2.8* 2.3* 3.3* 1.6*  NEUTROABS 4.5  --   --   --   --   HGB 12.1 10.5* 9.9*  12.1 11.8*  HCT 37.4 33.1* 30.6* 37.7 36.0  MCV 100.0 97.9 99.0 98.7 96.0  PLT 199 160 138* 191 179   Cardiac Enzymes: Recent Labs  Lab 12/05/17 1708 12/05/17 2234  TROPONINI <0.03 <0.03   BNP: BNP (last 3 results) Recent Labs    03/02/17 1525 12/02/17 0930 12/05/17 0801  BNP 630.9* 783.1* 377.5*     IMAGING STUDIES Dg Chest 2 View  Result Date: 12/05/2017 CLINICAL DATA:  Shortness of breath for 3-4 weeks. EXAM: CHEST  2 VIEW COMPARISON:  Two-view chest x-ray 12/03/2017 FINDINGS: The heart is enlarged. Mild pulmonary vascular congestion is present. Pacing wires are stable. Atherosclerotic calcifications are present at the aortic arch. There is no edema or effusion. No focal airspace consolidation is present. Degenerative changes of the thoracic spine are stable. IMPRESSION: 1. Stable cardiomegaly. 2. Mild pulmonary vascular congestion without frank edema. Electronically Signed   By: Wynetta Fines.D.  On: 12/05/2017 08:45   Nm Pulmonary Perf And Vent  Result Date: 12/05/2017 CLINICAL DATA:  Shortness of breath today. EXAM: NUCLEAR MEDICINE VENTILATION - PERFUSION LUNG SCAN TECHNIQUE: Ventilation images were obtained in multiple projections using inhaled aerosol Tc-63m DTPA. Perfusion images were obtained in multiple projections after intravenous injection of Tc-7m MAA. RADIOPHARMACEUTICALS:  31.8 mCi of Tc-13m DTPA aerosol inhalation and 4.2 mCi Tc77m MAA-IV COMPARISON:  PA and lateral chest 12/05/2017. FINDINGS: Ventilation: A nonsegmental wedge shaped defect is seen in the right lung just posterior to the apex. No other defect is identified. Perfusion: Defect in the superior aspect of the right lung just posterior to the apex matches the perfusion defect described above. IMPRESSION: Very low probability for pulmonary embolus. Electronically Signed   By: Inge Rise M.D.   On: 12/05/2017 19:56     DISCHARGE EXAMINATION: Vitals:   12/06/17 1110 12/06/17 1119  12/06/17 1153 12/06/17 1412  BP:    (!) 143/62  Pulse:    (!) 59  Resp:    16  Temp:    98.4 F (36.9 C)  TempSrc:    Oral  SpO2: 96% 99% 96% 96%  Weight:      Height:       General appearance: alert, cooperative, appears stated age and no distress Resp: clear to auscultation bilaterally Cardio: regular rate and rhythm, S1, S2 normal, no murmur, click, rub or gallop GI: soft, non-tender; bowel sounds normal; no masses,  no organomegaly  DISPOSITION: Home   Discharge Instructions    Call MD for:  difficulty breathing, headache or visual disturbances   Complete by:  As directed    Call MD for:  extreme fatigue   Complete by:  As directed    Call MD for:  persistant dizziness or light-headedness   Complete by:  As directed    Call MD for:  persistant nausea and vomiting   Complete by:  As directed    Call MD for:  severe uncontrolled pain   Complete by:  As directed    Call MD for:  temperature >100.4   Complete by:  As directed    Diet - low sodium heart healthy   Complete by:  As directed    Discharge instructions   Complete by:  As directed    Be sure to follow-up with your primary care provider within 1 week.  Take your medications as prescribed.  Follow-up with your cardiologist as scheduled.  Primary care physician will need to check your heart rate at follow-up.  Continue to hold carvedilol for now.  You were cared for by a hospitalist during your hospital stay. If you have any questions about your discharge medications or the care you received while you were in the hospital after you are discharged, you can call the unit and asked to speak with the hospitalist on call if the hospitalist that took care of you is not available. Once you are discharged, your primary care physician will handle any further medical issues. Please note that NO REFILLS for any discharge medications will be authorized once you are discharged, as it is imperative that you return to your primary care  physician (or establish a relationship with a primary care physician if you do not have one) for your aftercare needs so that they can reassess your need for medications and monitor your lab values. If you do not have a primary care physician, you can call [phone] for a physician referral.   Increase activity  slowly   Complete by:  As directed         Allergies as of 12/06/2017      Reactions   Penicillins Swelling   REACTION: nausea, swelling   Xifaxan [rifaximin] Other (See Comments), Rash   unknown   Ambien [zolpidem Tartrate]    " made her crazy "   Amoxicillin    REACTION: unspecified   Ceftin [cefuroxime Axetil] Diarrhea   Codeine Phosphate    REACTION: unspecified   Colchicine    Severe diarrhea   Lipitor [atorvastatin] Other (See Comments)   "makes legs jump all night"      Medication List    TAKE these medications   acetaminophen 500 MG tablet Commonly known as:  TYLENOL Take 1.5 tablets (750 mg total) by mouth every 6 (six) hours as needed. What changed:    how much to take  reasons to take this   amLODipine 5 MG tablet Commonly known as:  NORVASC TAKE 1 TABLET (5 MG TOTAL) BY MOUTH DAILY.   benzonatate 100 MG capsule Commonly known as:  TESSALON Take 1 capsule (100 mg total) by mouth every 8 (eight) hours.   [employer] VITAMIN B12 1000 MCG tablet Generic drug:  cyanocobalamin TAKE 1 TABLET BY MOUTH EVERY DAY What changed:    how much to take  how to take this  when to take this   diclofenac sodium 1 % Gel Commonly known as:  VOLTAREN Apply 2 g topically 3 (three) times daily.   ELIQUIS 2.5 MG Tabs tablet Generic drug:  apixaban TAKE 1 TABLET BY MOUTH TWICE A DAY What changed:    how much to take  how to take this  when to take this   furosemide 40 MG tablet Commonly known as:  LASIX Take 40mg  daily for 4 days and then as before for more than 2-3 pounds overnight and/or 5 pounds in a week What changed:    how much to take  how to take  this  when to take this  reasons to take this  additional instructions   hydrALAZINE 50 MG tablet Commonly known as:  APRESOLINE Take 1 tablet (50 mg total) by mouth 2 (two) times daily.   Hyoscyamine Sulfate SL 0.125 MG Subl Commonly known as:  LEVSIN/SL Take 1-2 tablets by mouth every 4 hours as needed What changed:    how much to take  how to take this  when to take this  reasons to take this  additional instructions   ipratropium-albuterol 0.5-2.5 (3) MG/3ML Soln Commonly known as:  DUONEB Take 3 mLs by nebulization 3 (three) times daily.   levofloxacin 250 MG tablet Commonly known as:  LEVAQUIN Take 1 tablet (250 mg total) by mouth daily for 5 days.   levothyroxine 88 MCG tablet Commonly known as:  SYNTHROID, LEVOTHROID TAKE 1 TABLET (88 MCG TOTAL) BY MOUTH DAILY BEFORE BREAKFAST.   nitroGLYCERIN 0.4 MG SL tablet Commonly known as:  NITROSTAT Place 1 tablet (0.4 mg total) under the tongue every 5 (five) minutes x 3 doses as needed for chest pain.   omeprazole 20 MG capsule Commonly known as:  PRILOSEC Take 1 capsule (20 mg total) by mouth daily.   predniSONE 20 MG tablet Commonly known as:  DELTASONE Take 3 tablets once daily for 3 days, then take 2 tablets once daily for 3 days, then take 1 tablet once daily for 3 days and then STOP.   temazepam 30 MG capsule Commonly known as:  RESTORIL  TAKE ONE CAPSULE BY MOUTH AT BEDTIME *DRUG NOT COVERED* What changed:  See the new instructions.   ULORIC 40 MG tablet Generic drug:  febuxostat Take 40 mg by mouth daily.   UNABLE TO FIND Nebulizer machine for reactive airway disease. J45.909            Durable Medical Equipment  (From admission, onward)        Start     Ordered   12/06/17 0938  For home use only DME Nebulizer machine  Once    Question:  Patient needs a nebulizer to treat with the following condition  Answer:  Reactive airway disease   12/06/17 7322       Follow-up Information      [name], MD. Schedule an appointment as soon as possible for a visit in 1 week(s).   Specialty:  Family Medicine Contact information: [place] [address] [phone]        [facility] Follow up.   Specialty:  Home Health Services Why:  for RN PT Contact information: [address] [place] [address] [facility] Follow up.   Why:  Nebulizer to be delivered to room prior to DC Contact information: [address] [place] [address] [phone]           TOTAL DISCHARGE TIME: 35 mins  [facility] Pager [phone]  12/06/2017, 3:25 PM

## 2017-12-06 NOTE — Evaluation (Signed)
Physical Therapy Evaluation Patient Details Name: [name] MRN: [id number] DOB: [birth year] Today's Date: 12/06/2017   History of Present Illness  Pt is a 84 y/o female admitted secondary to SOB. She was discharged on 1/24 after being managed for what appears to be acute bronchitis.  Patient however started developing shortness of breath and cough overnight.  She was noted to be hypoxic in the emergency department. PMH including but not limited to CKD and cardiac pacemaker placement in 2016.    Clinical Impression  Pt presented supine in bed with HOB elevated, awake and willing to participate in therapy session. Prior to admission, pt reported that she was independent with all functional mobility and ADLs. Pt lives alone but has family that can assist her if needed. Pt ambulated in hallway with RW and min guard for safety. Pt steady with RW but does not usually use one. Pt would benefit from further therapy services via HHPT upon d/c. Pt would continue to benefit from skilled physical therapy services at this time while admitted and after d/c to address the below listed limitations in order to improve overall safety and independence with functional mobility.     Follow Up Recommendations Home health PT;Supervision - Intermittent    Equipment Recommendations  None recommended by PT    Recommendations for Other Services       Precautions / Restrictions Precautions Precautions: Fall Restrictions Weight Bearing Restrictions: No      Mobility  Bed Mobility Overal bed mobility: Modified Independent                Transfers Overall transfer level: Needs assistance Equipment used: None Transfers: Sit to/from Stand Sit to Stand: Min guard         General transfer comment: min guard for safety  Ambulation/Gait Ambulation/Gait assistance: Min guard;Min assist Ambulation Distance (Feet): 150 Feet Assistive device: 1 person hand held assist;Rolling walker (2  wheeled) Gait Pattern/deviations: Step-through pattern Gait velocity: decreased   General Gait Details: pt initially unsteady and required min A with one HHA, with RW pt with improved stability and min guard for safety  Stairs            Wheelchair Mobility    Modified Rankin (Stroke Patients Only)       Balance Overall balance assessment: Needs assistance Sitting-balance support: Feet supported Sitting balance-Leahy Scale: Good     Standing balance support: During functional activity;No upper extremity supported Standing balance-Leahy Scale: Fair                               Pertinent Vitals/Pain Pain Assessment: No/denies pain    Home Living Family/patient expects to be discharged to:: Private residence Living Arrangements: Alone Available Help at Discharge: Family;Available PRN/intermittently Type of Home: Other(Comment)(Townhouse) Home Access: Level entry     Home Layout: One level Home Equipment: Walker - 2 wheels;Cane - single point      Prior Function Level of Independence: Independent               Hand Dominance        Extremity/Trunk Assessment   Upper Extremity Assessment Upper Extremity Assessment: Overall WFL for tasks assessed    Lower Extremity Assessment Lower Extremity Assessment: Overall WFL for tasks assessed    Cervical / Trunk Assessment Cervical / Trunk Assessment: Kyphotic  Communication   Communication: No difficulties  Cognition Arousal/Alertness: Awake/alert Behavior During Therapy: WFL for tasks assessed/performed Overall  Cognitive Status: Within Functional Limits for tasks assessed                                        General Comments      Exercises     Assessment/Plan    PT Assessment Patient needs continued PT services  PT Problem List Decreased balance;Decreased mobility;Decreased coordination       PT Treatment Interventions DME instruction;Gait training;Stair  training;Functional mobility training;Therapeutic activities;Therapeutic exercise;Balance training;Neuromuscular re-education;Patient/family education    PT Goals (Current goals can be found in the Care Plan section)  Acute Rehab PT Goals Patient Stated Goal: return home PT Goal Formulation: With patient Time For Goal Achievement: 12/20/17 Potential to Achieve Goals: Good    Frequency Min 3X/week   Barriers to discharge        Co-evaluation               AM-PAC PT "6 Clicks" Daily Activity  Outcome Measure Difficulty turning over in bed (including adjusting bedclothes, sheets and blankets)?: None Difficulty moving from lying on back to sitting on the side of the bed? : None Difficulty sitting down on and standing up from a chair with arms (e.g., wheelchair, bedside commode, etc,.)?: A Little Help needed moving to and from a bed to chair (including a wheelchair)?: A Little Help needed walking in hospital room?: A Little Help needed climbing 3-5 steps with a railing? : A Lot 6 Click Score: 19    End of Session   Activity Tolerance: Patient tolerated treatment well Patient left: in bed;with call bell/phone within reach Nurse Communication: Mobility status PT Visit Diagnosis: Unsteadiness on feet (R26.81);Other abnormalities of gait and mobility (R26.89)    Time: 2694-8546 PT Time Calculation (min) (ACUTE ONLY): 12 min   Charges:   PT Evaluation $PT Eval Low Complexity: 1 Low     PT G Codes:        [place], [place], [place] Schri[place] 12/06/2017, 4:27 PM

## 2017-12-06 NOTE — Progress Notes (Signed)
Waiting for the nebulizer to be delivered before D/C the patient.

## 2017-12-06 NOTE — Progress Notes (Signed)
SATURATION QUALIFICATIONS: (This note is used to comply with regulatory documentation for home oxygen)  Patient Saturations on Room Air at Rest = 96%  Patient Saturations on Room Air while Ambulating = 96%  Patient Saturations on 2 Liters of oxygen while Ambulating = 99%  Please briefly explain why patient needs home oxygen: Patient does not need home oxygen she ambulated with a walker and walked the whole hallway and back. She is steady but needs a walker for long distance. When ambulating in the room she does not need a walker.

## 2017-12-08 ENCOUNTER — Telehealth: Payer: Self-pay | Admitting: Family Medicine

## 2017-12-08 ENCOUNTER — Ambulatory Visit (INDEPENDENT_AMBULATORY_CARE_PROVIDER_SITE_OTHER): Payer: Medicare Other | Admitting: *Deleted

## 2017-12-08 DIAGNOSIS — I495 Sick sinus syndrome: Secondary | ICD-10-CM | POA: Diagnosis not present

## 2017-12-08 NOTE — Telephone Encounter (Signed)
I left a voice message for pt to return my call.  

## 2017-12-08 NOTE — Progress Notes (Signed)
Remote pacemaker transmission.   

## 2017-12-09 ENCOUNTER — Ambulatory Visit: Payer: Self-pay | Admitting: *Deleted

## 2017-12-09 LAB — CUP PACEART REMOTE DEVICE CHECK
Battery Voltage: 3 V
Brady Statistic AP VP Percent: 15.02 %
Brady Statistic AP VS Percent: 0.02 %
Brady Statistic AS VP Percent: 84.37 %
Brady Statistic RA Percent Paced: 11.68 %
Brady Statistic RV Percent Paced: 99.38 %
Date Time Interrogation Session: 20190128170852
Implantable Lead Implant Date: 20160620
Implantable Lead Location: 753859
Implantable Lead Location: 753860
Implantable Lead Model: 5076
Implantable Pulse Generator Implant Date: 20160620
Lead Channel Impedance Value: 418 Ohm
Lead Channel Pacing Threshold Pulse Width: 0.4 ms
Lead Channel Sensing Intrinsic Amplitude: 0.5 mV
Lead Channel Sensing Intrinsic Amplitude: 0.5 mV
Lead Channel Sensing Intrinsic Amplitude: 11.5 mV
Lead Channel Setting Pacing Amplitude: 1.5 V
Lead Channel Setting Pacing Amplitude: 2 V
Lead Channel Setting Pacing Pulse Width: 0.4 ms
Lead Channel Setting Sensing Sensitivity: 4 mV
MDC IDC LEAD IMPLANT DT: 20160620
MDC IDC MSMT BATTERY REMAINING LONGEVITY: 68 mo
MDC IDC MSMT LEADCHNL RA IMPEDANCE VALUE: 304 Ohm
MDC IDC MSMT LEADCHNL RA IMPEDANCE VALUE: 418 Ohm
MDC IDC MSMT LEADCHNL RV IMPEDANCE VALUE: 494 Ohm
MDC IDC MSMT LEADCHNL RV PACING THRESHOLD AMPLITUDE: 0.75 V
MDC IDC MSMT LEADCHNL RV SENSING INTR AMPL: 11.5 mV
MDC IDC STAT BRADY AS VS PERCENT: 0.59 %

## 2017-12-09 NOTE — Telephone Encounter (Signed)
Pt's daughter's called regarding her mom's symptoms from a medication Levofloxacin, that she was given to take for 5 days. She states her mom has tingling in her hands and fingers and muscle soreness. Will check with Dr. Sarajane Jews and give her a call back. [name] (pt's daughter) called back and told to stop giving her the antibiotic and make sure she keeps her appointment in the morning. She voiced understanding.  Also if her mom starts experiencing any other symptoms, to give [place] a call back.    Reason for Disposition . Caller has URGENT medication question about med that PCP prescribed and triager unable to answer question  Answer Assessment - Initial Assessment Questions 1. SYMPTOMS: "Do you have any symptoms?"     Tingling in hands and fingers, muscle soreness 2. SEVERITY: If symptoms are present, ask "Are they mild, moderate or severe?"     severe  Protocols used: MEDICATION QUESTION CALL-A-AH

## 2017-12-09 NOTE — Telephone Encounter (Signed)
Unable to reach patient at time of TCM Call. Left message for patient to return call when available.  

## 2017-12-10 ENCOUNTER — Encounter: Payer: Self-pay | Admitting: Cardiology

## 2017-12-10 ENCOUNTER — Encounter: Payer: Self-pay | Admitting: Family Medicine

## 2017-12-10 ENCOUNTER — Ambulatory Visit: Payer: Medicare Other | Admitting: Family Medicine

## 2017-12-10 VITALS — BP 126/78 | HR 68 | Temp 97.5°F | Wt 118.0 lb

## 2017-12-10 DIAGNOSIS — I1310 Hypertensive heart and chronic kidney disease without heart failure, with stage 1 through stage 4 chronic kidney disease, or unspecified chronic kidney disease: Secondary | ICD-10-CM

## 2017-12-10 DIAGNOSIS — R0902 Hypoxemia: Secondary | ICD-10-CM | POA: Diagnosis not present

## 2017-12-10 DIAGNOSIS — N184 Chronic kidney disease, stage 4 (severe): Secondary | ICD-10-CM

## 2017-12-10 DIAGNOSIS — J4541 Moderate persistent asthma with (acute) exacerbation: Secondary | ICD-10-CM | POA: Diagnosis not present

## 2017-12-10 DIAGNOSIS — R0609 Other forms of dyspnea: Secondary | ICD-10-CM

## 2017-12-10 DIAGNOSIS — I5032 Chronic diastolic (congestive) heart failure: Secondary | ICD-10-CM

## 2017-12-10 LAB — BASIC METABOLIC PANEL
BUN: 65 mg/dL — AB (ref 6–23)
CALCIUM: 7 mg/dL — AB (ref 8.4–10.5)
CHLORIDE: 90 meq/L — AB (ref 96–112)
CO2: 35 mEq/L — ABNORMAL HIGH (ref 19–32)
CREATININE: 2.4 mg/dL — AB (ref 0.40–1.20)
GFR: 20.1 mL/min — ABNORMAL LOW (ref >60.00)
Glucose, Bld: 122 mg/dL — ABNORMAL HIGH (ref 70–99)
Potassium: 3.3 mEq/L — ABNORMAL LOW (ref 3.5–5.1)
Sodium: 141 mEq/L (ref 135–145)

## 2017-12-10 MED ORDER — LORAZEPAM 0.5 MG PO TABS: 0.5000 mg | ORAL_TABLET | Freq: Three times a day (TID) | ORAL | 1 refills | Status: DC | PRN

## 2017-12-10 MED ORDER — FLUTICASONE PROPIONATE HFA 110 MCG/ACT IN AERO: 2.0000 | INHALATION_SPRAY | Freq: Two times a day (BID) | RESPIRATORY_TRACT | 12 refills | Status: DC

## 2017-12-10 NOTE — Telephone Encounter (Signed)
Pt was seen here in office today

## 2017-12-10 NOTE — Progress Notes (Signed)
   Subjective:    Patient ID: [name], female    DOB: 1924-01-30, 84 y.o.   MRN: [id number]  HPI Here to follow up a hospital stay from 12-05-17 to 12-06-17 for dyspnea and hypoxia. She is here with her son and daughter-in-law, and she will be staying with them until she feels better. The SOB was felt to be due to a combination of reactive airways disease and acute on chronic diastolic heart failure. She was diuresed with IV Lasix. She was treated with nebulizers and IV steroids. Her CKD was stable. Due to bradycardia her Carvedilol was stopped. She was sent home on oral Lasix, but she only took this one day. She has not taken any of her medications for the past 2 days because she feels so weak. She has no appetite and has not eaten any food for 2 days. She is drinking tiny amounts of fluid. She feels weak and gets SOB with minimal exertion, but at least the coughing has stopped. Her family has tracked her vital signs and her BP has ranged in the 110s over 60s and the pulse rate has stayed in the 60s. She has lost 3.5 lbs since leaving the hospital. Before her DC home her creatinine had bumped up a little from a baseline of 1.3 to 1.53. Her potassium as stable at 3.9. Her HGB was stable at 11.8.    Review of Systems  Constitutional: Positive for fatigue. Negative for chills, diaphoresis and fever.  Respiratory: Positive for chest tightness and shortness of breath. Negative for cough and wheezing.   Cardiovascular: Negative.   Gastrointestinal: Negative.   Genitourinary: Negative.   Neurological: Negative.        Objective:   Physical Exam  Constitutional: She is oriented to person, place, and time.  Frail, in a wheelchair   Neck: No thyromegaly present.  Cardiovascular: Normal rate, regular rhythm, normal heart sounds and intact distal pulses.  Pulmonary/Chest: Effort normal and breath sounds normal. No respiratory distress. She has no wheezes. She has no rales.  Musculoskeletal: She  exhibits no edema.  Lymphadenopathy:    She has no cervical adenopathy.  Neurological: She is alert and oriented to person, place, and time.          Assessment & Plan:  Her original bronchitis has resolved. Her RAD has settle down a bit but she is still mildly hypoxic. We will start her on Flovent HFA bid. She can use the nebulizer prn. Refer her to Pulmonary. She will stay off Carvedilol and Lasix. She is mildly dehydrated today so the family will encourage her to drink fluids. As long as her appetite is so poor I asked them to strive to have her drink 3 cans of Boost or Ensure every day. We will check a BMET today to follow the potassium and the renal function. She will stay off Amlodipine and Hydralazine for now, and the family will watch her BP and heart rates closely. They will report these to [place] via My Chart. She is scheduled to see Dr. Ellyn Hack on 01-15-18.  [name], MD

## 2017-12-11 ENCOUNTER — Telehealth: Payer: Self-pay | Admitting: Family Medicine

## 2017-12-11 ENCOUNTER — Inpatient Hospital Stay (HOSPITAL_COMMUNITY)
Admission: EM | Admit: 2017-12-11 | Discharge: 2017-12-17 | DRG: 683 | Disposition: A | Discharge status: Home / Self Care | Payer: Medicare Other | Attending: Internal Medicine | Admitting: Internal Medicine

## 2017-12-11 ENCOUNTER — Other Ambulatory Visit: Payer: Self-pay

## 2017-12-11 ENCOUNTER — Encounter: Payer: Self-pay | Admitting: Family Medicine

## 2017-12-11 ENCOUNTER — Encounter (HOSPITAL_COMMUNITY): Payer: Self-pay | Admitting: Neurology

## 2017-12-11 ENCOUNTER — Emergency Department (HOSPITAL_COMMUNITY): Payer: Medicare Other

## 2017-12-11 DIAGNOSIS — E861 Hypovolemia: Secondary | ICD-10-CM | POA: Diagnosis present

## 2017-12-11 DIAGNOSIS — E876 Hypokalemia: Secondary | ICD-10-CM | POA: Diagnosis present

## 2017-12-11 DIAGNOSIS — Z7901 Long term (current) use of anticoagulants: Secondary | ICD-10-CM | POA: Diagnosis not present

## 2017-12-11 DIAGNOSIS — R440 Auditory hallucinations: Secondary | ICD-10-CM | POA: Diagnosis present

## 2017-12-11 DIAGNOSIS — N179 Acute kidney failure, unspecified: Secondary | ICD-10-CM | POA: Diagnosis present

## 2017-12-11 DIAGNOSIS — H04123 Dry eye syndrome of bilateral lacrimal glands: Secondary | ICD-10-CM | POA: Diagnosis present

## 2017-12-11 DIAGNOSIS — E039 Hypothyroidism, unspecified: Secondary | ICD-10-CM | POA: Diagnosis present

## 2017-12-11 DIAGNOSIS — N183 Chronic kidney disease, stage 3 (moderate): Secondary | ICD-10-CM

## 2017-12-11 DIAGNOSIS — Z881 Allergy status to other antibiotic agents status: Secondary | ICD-10-CM

## 2017-12-11 DIAGNOSIS — I130 Hypertensive heart and chronic kidney disease with heart failure and stage 1 through stage 4 chronic kidney disease, or unspecified chronic kidney disease: Secondary | ICD-10-CM | POA: Diagnosis present

## 2017-12-11 DIAGNOSIS — T502X5A Adverse effect of carbonic-anhydrase inhibitors, benzothiadiazides and other diuretics, initial encounter: Secondary | ICD-10-CM | POA: Diagnosis present

## 2017-12-11 DIAGNOSIS — Z6827 Body mass index (BMI) 27.0-27.9, adult: Secondary | ICD-10-CM

## 2017-12-11 DIAGNOSIS — I482 Chronic atrial fibrillation: Secondary | ICD-10-CM | POA: Diagnosis present

## 2017-12-11 DIAGNOSIS — I4819 Other persistent atrial fibrillation: Secondary | ICD-10-CM | POA: Diagnosis present

## 2017-12-11 DIAGNOSIS — I481 Persistent atrial fibrillation: Secondary | ICD-10-CM | POA: Diagnosis not present

## 2017-12-11 DIAGNOSIS — F5105 Insomnia due to other mental disorder: Secondary | ICD-10-CM | POA: Diagnosis present

## 2017-12-11 DIAGNOSIS — Z950 Presence of cardiac pacemaker: Secondary | ICD-10-CM

## 2017-12-11 DIAGNOSIS — Z880 Allergy status to penicillin: Secondary | ICD-10-CM

## 2017-12-11 DIAGNOSIS — I5032 Chronic diastolic (congestive) heart failure: Secondary | ICD-10-CM | POA: Diagnosis present

## 2017-12-11 DIAGNOSIS — Z66 Do not resuscitate: Secondary | ICD-10-CM | POA: Diagnosis present

## 2017-12-11 DIAGNOSIS — R5381 Other malaise: Secondary | ICD-10-CM | POA: Diagnosis not present

## 2017-12-11 DIAGNOSIS — F419 Anxiety disorder, unspecified: Secondary | ICD-10-CM | POA: Diagnosis present

## 2017-12-11 DIAGNOSIS — I480 Paroxysmal atrial fibrillation: Secondary | ICD-10-CM | POA: Diagnosis present

## 2017-12-11 DIAGNOSIS — Z888 Allergy status to other drugs, medicaments and biological substances status: Secondary | ICD-10-CM | POA: Diagnosis not present

## 2017-12-11 DIAGNOSIS — Z9849 Cataract extraction status, unspecified eye: Secondary | ICD-10-CM

## 2017-12-11 DIAGNOSIS — E440 Moderate protein-calorie malnutrition: Secondary | ICD-10-CM | POA: Diagnosis present

## 2017-12-11 DIAGNOSIS — E038 Other specified hypothyroidism: Secondary | ICD-10-CM | POA: Diagnosis not present

## 2017-12-11 DIAGNOSIS — N189 Chronic kidney disease, unspecified: Secondary | ICD-10-CM | POA: Diagnosis not present

## 2017-12-11 DIAGNOSIS — J449 Chronic obstructive pulmonary disease, unspecified: Secondary | ICD-10-CM | POA: Diagnosis present

## 2017-12-11 DIAGNOSIS — L8990 Pressure ulcer of unspecified site, unspecified stage: Secondary | ICD-10-CM

## 2017-12-11 DIAGNOSIS — R262 Difficulty in walking, not elsewhere classified: Secondary | ICD-10-CM | POA: Diagnosis not present

## 2017-12-11 DIAGNOSIS — N184 Chronic kidney disease, stage 4 (severe): Secondary | ICD-10-CM | POA: Diagnosis present

## 2017-12-11 LAB — URINALYSIS, ROUTINE W REFLEX MICROSCOPIC
BILIRUBIN URINE: NEGATIVE
Glucose, UA: NEGATIVE mg/dL
Hgb urine dipstick: NEGATIVE
KETONES UR: NEGATIVE mg/dL
LEUKOCYTES UA: NEGATIVE
NITRITE: NEGATIVE
PH: 5 (ref 5.0–8.0)
PROTEIN: NEGATIVE mg/dL
Specific Gravity, Urine: 1.014 (ref 1.005–1.030)

## 2017-12-11 LAB — CBC
HCT: 44 % (ref 36.0–46.0)
Hemoglobin: 14.8 g/dL (ref 12.0–15.0)
MCH: 32.2 pg (ref 26.0–34.0)
MCHC: 33.6 g/dL (ref 30.0–36.0)
MCV: 95.7 fL (ref 78.0–100.0)
PLATELETS: 206 10*3/uL (ref 150–400)
RBC: 4.6 MIL/uL (ref 3.87–5.11)
RDW: 14.1 % (ref 11.5–15.5)
WBC: 10.7 10*3/uL — AB (ref 4.0–10.5)

## 2017-12-11 LAB — HEPATIC FUNCTION PANEL
ALBUMIN: 3.4 g/dL — AB (ref 3.5–5.0)
ALT: 24 U/L (ref 14–54)
AST: 31 U/L (ref 15–41)
Alkaline Phosphatase: 86 U/L (ref 38–126)
Bilirubin, Direct: 0.2 mg/dL (ref 0.1–0.5)
Indirect Bilirubin: 0.6 mg/dL (ref 0.3–0.9)
Total Bilirubin: 0.8 mg/dL (ref 0.3–1.2)
Total Protein: 6.2 g/dL — ABNORMAL LOW (ref 6.5–8.1)

## 2017-12-11 LAB — BASIC METABOLIC PANEL
ANION GAP: 16 — AB (ref 5–15)
BUN: 67 mg/dL — ABNORMAL HIGH (ref 6–20)
CHLORIDE: 93 mmol/L — AB (ref 101–111)
CO2: 32 mmol/L (ref 22–32)
Calcium: 6.6 mg/dL — ABNORMAL LOW (ref 8.9–10.3)
Creatinine, Ser: 2.33 mg/dL — ABNORMAL HIGH (ref 0.44–1.00)
GFR calc non Af Amer: 17 mL/min — ABNORMAL LOW (ref >60)
GFR, EST AFRICAN AMERICAN: 20 mL/min — AB (ref >60)
Glucose, Bld: 96 mg/dL (ref 65–99)
Potassium: 3.2 mmol/L — ABNORMAL LOW (ref 3.5–5.1)
Sodium: 141 mmol/L (ref 135–145)

## 2017-12-11 LAB — BRAIN NATRIURETIC PEPTIDE: B NATRIURETIC PEPTIDE 5: 114.4 pg/mL — AB (ref 0.0–100.0)

## 2017-12-11 LAB — TSH: TSH: 4.376 u[IU]/mL (ref 0.350–4.500)

## 2017-12-11 MED ORDER — ACETAMINOPHEN 325 MG PO TABS
650.0000 mg | ORAL_TABLET | Freq: Four times a day (QID) | ORAL | Status: DC | PRN
  Administered 2017-12-12 (×2): 650 mg via ORAL
  Filled 2017-12-11 (×2): qty 2

## 2017-12-11 MED ORDER — HYOSCYAMINE SULFATE SL 0.125 MG SL SUBL: 0.1250 mg | SUBLINGUAL_TABLET | SUBLINGUAL | Status: DC | PRN

## 2017-12-11 MED ORDER — FEBUXOSTAT 40 MG PO TABS
40.0000 mg | ORAL_TABLET | Freq: Every day | ORAL | Status: DC
  Administered 2017-12-12 – 2017-12-17 (×6): 40 mg via ORAL
  Filled 2017-12-11 (×6): qty 1

## 2017-12-11 MED ORDER — HYDROCODONE-ACETAMINOPHEN 5-325 MG PO TABS: 1.0000 | ORAL_TABLET | ORAL | Status: DC | PRN

## 2017-12-11 MED ORDER — SODIUM CHLORIDE 0.9 % IV BOLUS (SEPSIS)
500.0000 mL | Freq: Once | INTRAVENOUS | Status: AC
  Administered 2017-12-11: 500 mL via INTRAVENOUS

## 2017-12-11 MED ORDER — ONDANSETRON HCL 4 MG PO TABS: 4.0000 mg | ORAL_TABLET | Freq: Four times a day (QID) | ORAL | Status: DC | PRN

## 2017-12-11 MED ORDER — BISACODYL 5 MG PO TBEC: 5.0000 mg | DELAYED_RELEASE_TABLET | Freq: Every day | ORAL | Status: DC | PRN

## 2017-12-11 MED ORDER — ONDANSETRON HCL 4 MG/2ML IJ SOLN: 4.0000 mg | Freq: Four times a day (QID) | INTRAMUSCULAR | Status: DC | PRN

## 2017-12-11 MED ORDER — SENNOSIDES-DOCUSATE SODIUM 8.6-50 MG PO TABS: 1.0000 | ORAL_TABLET | Freq: Every evening | ORAL | Status: DC | PRN

## 2017-12-11 MED ORDER — LEVOTHYROXINE SODIUM 88 MCG PO TABS
88.0000 ug | ORAL_TABLET | Freq: Every day | ORAL | Status: DC
  Administered 2017-12-12 – 2017-12-17 (×6): 88 ug via ORAL
  Filled 2017-12-11 (×7): qty 1

## 2017-12-11 MED ORDER — FLUTICASONE PROPIONATE HFA 110 MCG/ACT IN AERO: 2.0000 | INHALATION_SPRAY | Freq: Two times a day (BID) | RESPIRATORY_TRACT | Status: DC

## 2017-12-11 MED ORDER — ENSURE ENLIVE PO LIQD
237.0000 mL | Freq: Two times a day (BID) | ORAL | Status: DC
  Administered 2017-12-12 – 2017-12-17 (×9): 237 mL via ORAL

## 2017-12-11 MED ORDER — PANTOPRAZOLE SODIUM 40 MG PO TBEC
40.0000 mg | DELAYED_RELEASE_TABLET | Freq: Every day | ORAL | Status: DC
Start: 2017-12-12 — End: 2017-12-17
  Administered 2017-12-12 – 2017-12-17 (×6): 40 mg via ORAL
  Filled 2017-12-11 (×6): qty 1

## 2017-12-11 MED ORDER — LORAZEPAM 0.5 MG PO TABS
0.5000 mg | ORAL_TABLET | Freq: Three times a day (TID) | ORAL | Status: DC | PRN
  Administered 2017-12-13 – 2017-12-16 (×5): 0.5 mg via ORAL
  Filled 2017-12-11 (×5): qty 1

## 2017-12-11 MED ORDER — SODIUM CHLORIDE 0.9 % IV SOLN
1.0000 g | Freq: Once | INTRAVENOUS | Status: AC
  Administered 2017-12-12: 1 g via INTRAVENOUS
  Filled 2017-12-11: qty 10

## 2017-12-11 MED ORDER — BUDESONIDE 0.25 MG/2ML IN SUSP
0.2500 mg | Freq: Two times a day (BID) | RESPIRATORY_TRACT | Status: DC
  Administered 2017-12-11 – 2017-12-17 (×10): 0.25 mg via RESPIRATORY_TRACT
  Filled 2017-12-11 (×13): qty 2

## 2017-12-11 MED ORDER — IPRATROPIUM-ALBUTEROL 0.5-2.5 (3) MG/3ML IN SOLN
3.0000 mL | Freq: Three times a day (TID) | RESPIRATORY_TRACT | Status: DC
Start: 2017-12-11 — End: 2017-12-17
  Administered 2017-12-11 – 2017-12-17 (×14): 3 mL via RESPIRATORY_TRACT
  Filled 2017-12-11 (×17): qty 3

## 2017-12-11 MED ORDER — AMLODIPINE BESYLATE 5 MG PO TABS
5.0000 mg | ORAL_TABLET | Freq: Every day | ORAL | Status: DC
  Administered 2017-12-11 – 2017-12-17 (×7): 5 mg via ORAL
  Filled 2017-12-11 (×7): qty 1

## 2017-12-11 MED ORDER — VITAMIN B-12 1000 MCG PO TABS
1000.0000 ug | ORAL_TABLET | Freq: Every day | ORAL | Status: DC
  Administered 2017-12-12 – 2017-12-17 (×6): 1000 ug via ORAL
  Filled 2017-12-11 (×6): qty 1

## 2017-12-11 MED ORDER — HYOSCYAMINE SULFATE 0.125 MG SL SUBL
0.1250 mg | SUBLINGUAL_TABLET | SUBLINGUAL | Status: DC | PRN
  Filled 2017-12-11: qty 2

## 2017-12-11 MED ORDER — POTASSIUM CHLORIDE CRYS ER 20 MEQ PO TBCR
40.0000 meq | EXTENDED_RELEASE_TABLET | Freq: Once | ORAL | Status: AC
  Administered 2017-12-11: 40 meq via ORAL
  Filled 2017-12-11: qty 2

## 2017-12-11 MED ORDER — MAGNESIUM SULFATE 2 GM/50ML IV SOLN
2.0000 g | Freq: Once | INTRAVENOUS | Status: AC
  Administered 2017-12-11: 2 g via INTRAVENOUS
  Filled 2017-12-11: qty 50

## 2017-12-11 MED ORDER — APIXABAN 2.5 MG PO TABS
2.5000 mg | ORAL_TABLET | Freq: Two times a day (BID) | ORAL | Status: DC
  Administered 2017-12-11 – 2017-12-17 (×12): 2.5 mg via ORAL
  Filled 2017-12-11 (×12): qty 1

## 2017-12-11 MED ORDER — BENZONATATE 100 MG PO CAPS: 100.0000 mg | ORAL_CAPSULE | Freq: Three times a day (TID) | ORAL | Status: DC | PRN

## 2017-12-11 MED ORDER — TEMAZEPAM 15 MG PO CAPS
30.0000 mg | ORAL_CAPSULE | Freq: Every evening | ORAL | Status: DC | PRN
  Administered 2017-12-11 – 2017-12-16 (×6): 30 mg via ORAL
  Filled 2017-12-11 (×6): qty 2

## 2017-12-11 MED ORDER — HYDRALAZINE HCL 50 MG PO TABS
50.0000 mg | ORAL_TABLET | Freq: Two times a day (BID) | ORAL | Status: DC
  Administered 2017-12-12 – 2017-12-17 (×11): 50 mg via ORAL
  Filled 2017-12-11 (×11): qty 1

## 2017-12-11 MED ORDER — SODIUM CHLORIDE 0.9 % IV SOLN
INTRAVENOUS | Status: AC
  Administered 2017-12-11: 22:00:00 via INTRAVENOUS

## 2017-12-11 MED ORDER — ACETAMINOPHEN 650 MG RE SUPP: 650.0000 mg | Freq: Four times a day (QID) | RECTAL | Status: DC | PRN

## 2017-12-11 NOTE — Telephone Encounter (Signed)
Copied from [place]. Topic: Quick Communication - See Telephone Encounter >> Dec 11, 2017 11:51 AM Bea Graff, NT wrote: CRM for notification. See Telephone encounter for: [facility] with [place] would like a call with the result from the patient BMET that she had completed yesterday before completing her admission with them today. CB#: [phone]  12/11/17.

## 2017-12-11 NOTE — H&P (Signed)
History and Physical    [name] HBZ:[id number] DOB: [birth year] DOA: 12/11/2017  PCP: [name], MD   Patient coming from: Home  Chief Complaint: Lethargy, malaise, wt-loss   HPI: [name] is a 84 y.o. female with medical history significant for asthma/COPD, chronic diastolic CHF, symptomatic bradycardia with pacer, and chronic kidney disease stage III-IV, now presenting to the emergency department for evaluation of lethargy and malaise with 20 pound weight loss over the past week.  Patient had been admitted to the hospital on 12/05/2017 with acute on chronic diastolic CHF.  She was diuresed with IV Lasix, discharged home with increased Lasix, and has continued to have an impressive diuresis at home.  She has now lost 20 pounds since the hospital admission.  Over the past few days, she has become increasingly lethargic with a general malaise.  Denies fevers or chills, denies chest pain or palpitations, and denies abdominal pain, vomiting, or diarrhea.  ED Course: Upon arrival to the ED, patient is found to be afebrile, saturating well on room air, and with vitals otherwise stable.  EKG features a ventricular paced rhythm.  Chest x-ray is notable for mild hyperinflation, but otherwise unremarkable.  Chemistry panel reveals a potassium of 3.2, BUN 67, and creatinine of 2.33, up from a baseline of 1.3.  Serum calcium is 6.6 with albumin of 3.4.  CBC features a slight leukocytosis to 10,700 and TSH is within the normal limits.  Urinalysis is unremarkable.  Patient was given 500 cc normal saline in the ED.  She remains hemodynamically stable, in no apparent respiratory distress, and will be admitted to the medical-surgical unit for ongoing evaluation and management of acute kidney injury superimposed on chronic kidney disease stage III.  Review of Systems:  All other systems reviewed and apart from HPI, are negative.  Past Medical History:  Diagnosis Date  . Anemia   . Anxiety   .  Arthritis   . Asthma   . Benign positional vertigo   . Cardiac pacemaker in situ 05/01/2015   for syncope & Sx Bradycardia -> Medtronic MRI compatible pacemaker placed Dr. Caryl Comes   . Chronic diarrhea   . Chronic kidney disease, stage IV (severe) ([place])   . Colon polyps   . Diastolic dysfunction, left ventricle 11/03/2013  . Diverticulosis   . Gout   . Hemorrhoids   . Hyperlipidemia   . Hypertensive heart disease    no significant RAS by MRA 2002- (<30% LRAS)   . Hypothyroidism   . IBS (irritable bowel syndrome)   . LBBB (left bundle branch block)- new 11/01/13 11/01/2013  . Paroxysmal atrial fibrillation 11/01/2013   Intermittent through the years and recurrent in December of 2014 treated with amiodarone;; Negative Myoview 10/2013  . Peripheral vascular disease    10-17% LICA, 5-10% RICA by doppler 2009   . Sinus arrest 04/28/2015  . Syncope and collapse 04/28/2015   s/p PPM  . Tubulovillous adenoma 4/07    Past Surgical History:  Procedure Laterality Date  . APPENDECTOMY    . CATARACT EXTRACTION    . EP IMPLANTABLE DEVICE N/A 05/01/2015   Procedure: Pacemaker Implant;  Surgeon: Deboraha Sprang, MD;  Location: [facility];  Service: Cardiovascular;  Laterality: N/A;  . HIP ARTHROPLASTY Left 05/01/2015   Procedure: ARTHROPLASTY BIPOLAR HIP (HEMIARTHROPLASTY);  Surgeon: Rod Can, MD;  Location: [facility];  Service: Orthopedics;  Laterality: Left;  . LOW ANTERIOR BOWEL RESECTION  7/08  . LUMBAR DISC SURGERY  2000  . NM MYOVIEW LTD  03/18/2013   Negative for ischemia or infarction. EF 55%.  LOW RISK  . OOPHORECTOMY  1962   right  . squamous cell skin cancer removed    . TRANSTHORACIC ECHOCARDIOGRAM  10/2013, 02/2017   A) EF 55-60%, mild LVH, elevated bili pressures. Mild aortic valve calcification;; B) Normal LV size and function with mild LVH. Unable to assess diastolic function. Normal PA pressures. Mild MR,Mild AI     reports that  has never smoked. she has never used  smokeless tobacco. She reports that she does not drink alcohol or use drugs.  Allergies  Allergen Reactions  . Penicillins Swelling    REACTION: nausea, swelling  . Xifaxan [Rifaximin] Other (See Comments) and Rash    unknown  . Ambien [Zolpidem Tartrate]     " made her crazy "  . Amoxicillin     REACTION: unspecified  . Ceftin [Cefuroxime Axetil] Diarrhea  . Codeine Phosphate     REACTION: unspecified  . Colchicine     Severe diarrhea  . Levofloxacin Other (See Comments)    Tingle numbness  . Lipitor [Atorvastatin] Other (See Comments)    "makes legs jump all night"    Family History  Problem Relation Age of Onset  . Cancer Mother 92       unknown  . Heart attack Father   . Heart disease Brother   . Stroke Maternal Grandmother   . Lung cancer Maternal Grandfather   . Stroke Paternal Grandmother   . Colon cancer Daughter      Prior to Admission medications   Medication Sig Start Date End Date Taking? Authorizing Provider  acetaminophen (TYLENOL) 500 MG tablet Take 1.5 tablets (750 mg total) by mouth every 6 (six) hours as needed. Patient taking differently: Take 725 mg by mouth every 6 (six) hours as needed for mild pain, fever or headache.  01/11/17  Yes Mackuen, Courteney Lyn, MD  amLODipine (NORVASC) 5 MG tablet TAKE 1 TABLET (5 MG TOTAL) BY MOUTH DAILY. 03/21/17  Yes [name], MD  benzonatate (TESSALON) 100 MG capsule Take 1 capsule (100 mg total) by mouth every 8 (eight) hours. 12/06/17  Yes Bonnielee Haff, MD  [facility] VITAMIN B12 1000 MCG tablet TAKE 1 TABLET BY MOUTH EVERY DAY Patient taking differently: TAKE 1 TABLET (1058mcg) BY MOUTH EVERY DAY 05/02/17  Yes [name], MD  ELIQUIS 2.5 MG TABS tablet TAKE 1 TABLET BY MOUTH TWICE A DAY Patient taking differently: TAKE 1 TABLET (2.5mg ) BY MOUTH TWICE A DAY 07/09/17  Yes [name], MD  fluticasone (FLOVENT HFA) 110 MCG/ACT inhaler Inhale 2 puffs into the lungs 2 (two) times daily. 12/10/17  Yes [name], MD  Hyoscyamine Sulfate SL (LEVSIN/SL) 0.125 MG SUBL Take 1-2 tablets by mouth every 4 hours as needed Patient taking differently: Take 0.125-0.25 mg by mouth every 4 (four) hours as needed (spasms).  05/05/17  Yes Ladene Artist, MD  ipratropium-albuterol (DUONEB) 0.5-2.5 (3) MG/3ML SOLN Take 3 mLs by nebulization 3 (three) times daily. 12/06/17  Yes Bonnielee Haff, MD  levothyroxine (SYNTHROID, LEVOTHROID) 88 MCG tablet TAKE 1 TABLET (88 MCG TOTAL) BY MOUTH DAILY BEFORE BREAKFAST. 05/16/17  Yes [name], MD  LORazepam (ATIVAN) 0.5 MG tablet Take 1 tablet (0.5 mg total) by mouth every 8 (eight) hours as needed for anxiety. 12/10/17  Yes [name], MD  nitroGLYCERIN (NITROSTAT) 0.4 MG SL tablet Place 1 tablet (0.4 mg  total) under the tongue every 5 (five) minutes x 3 doses as needed for chest pain. 02/27/16  Yes [name], MD  omeprazole (PRILOSEC) 20 MG capsule Take 1 capsule (20 mg total) by mouth daily. 05/05/17  Yes Ladene Artist, MD  temazepam (RESTORIL) 30 MG capsule TAKE ONE CAPSULE BY MOUTH AT BEDTIME *DRUG NOT COVERED* Patient taking differently: TAKE ONE CAPSULE (30mg ) BY MOUTH AT BEDTIME *DRUG NOT COVERED* 06/20/17  Yes [name], MD  ULORIC 40 MG tablet Take 40 mg by mouth daily.  03/12/16  Yes [provider]  UNABLE TO FIND Nebulizer machine for reactive airway disease. J45.909 12/06/17  Yes Bonnielee Haff, MD  furosemide (LASIX) 40 MG tablet Take 40mg  daily for 4 days and then as before for more than 2-3 pounds overnight and/or 5 pounds in a week Patient taking differently: Take 40 mg by mouth daily. Take 40mg  daily for 4 days and then as before for more than 2-3 pounds overnight and/or 5 pounds in a week 12/06/17   Bonnielee Haff, MD  hydrALAZINE (APRESOLINE) 50 MG tablet Take 1 tablet (50 mg total) by mouth 2 (two) times daily. 04/14/17 12/05/17  [name], MD    Physical Exam: Vitals:   12/11/17 1900 12/11/17 1930 12/11/17 2000 12/11/17  2100  BP: 130/71 (!) 143/80 131/84 (!) 139/95  Pulse: (!) 59 61 (!) 59 (!) 59  Resp: 14 16 15 20   Temp:      TempSrc:      SpO2: 94% 94% 95% 94%  Weight:          Constitutional: NAD, calm  Eyes: PERTLA, lids and conjunctivae normal ENMT: Mucous membranes are dry. Posterior pharynx clear of any exudate or lesions.   Neck: normal, supple, no masses, no thyromegaly Respiratory: Breath sounds mildly diminished bilaterally, no wheezing, no crackles. Normal respiratory effort.   Cardiovascular: S1 & S2 heard, regular rate and rhythm. No significant JVD. Abdomen: No distension, no tenderness, no masses palpated. Bowel sounds active.  Musculoskeletal: no clubbing / cyanosis. No joint deformity upper and lower extremities.   Skin: no significant rashes, lesions, ulcers. Poor turgor. Neurologic: CN 2-12 grossly intact. Sensation intact. Strength 5/5 in all 4 limbs.  Psychiatric: Alert and oriented x 3. Pleasant and cooperative.     Labs on Admission: I have personally reviewed following labs and imaging studies  CBC: Recent Labs  Lab 12/05/17 0801 12/06/17 0319 12/11/17 1430  WBC 3.3* 1.6* 10.7*  HGB 12.1 11.8* 14.8  HCT 37.7 36.0 44.0  MCV 98.7 96.0 95.7  PLT 191 179 161   Basic Metabolic Panel: Recent Labs  Lab 12/05/17 0801 12/06/17 0319 12/10/17 1403 12/11/17 1430  NA 140 140 141 141  K 3.6 3.9 3.3* 3.2*  CL 103 100* 90* 93*  CO2 24 28 35* 32  GLUCOSE 88 151* 122* 96  BUN 30* 34* 65* 67*  CREATININE 1.38* 1.53* 2.40* 2.33*  CALCIUM 8.6* 8.4* 7.0* 6.6*   GFR: Estimated Creatinine Clearance: 12.4 mL/min (A) (by C-G formula based on SCr of 2.33 mg/dL (H)). Liver Function Tests: Recent Labs  Lab 12/11/17 1702  AST 31  ALT 24  ALKPHOS 86  BILITOT 0.8  PROT 6.2*  ALBUMIN 3.4*   No results for input(s): LIPASE, AMYLASE in the last 168 hours. No results for input(s): AMMONIA in the last 168 hours. Coagulation Profile: No results for input(s): INR, PROTIME in  the last 168 hours. Cardiac Enzymes: Recent Labs  Lab 12/05/17  1708 12/05/17 2234  TROPONINI <0.03 <0.03   BNP (last 3 results) No results for input(s): PROBNP in the last 8760 hours. HbA1C: No results for input(s): HGBA1C in the last 72 hours. CBG: No results for input(s): GLUCAP in the last 168 hours. Lipid Profile: No results for input(s): CHOL, HDL, LDLCALC, TRIG, CHOLHDL, LDLDIRECT in the last 72 hours. Thyroid Function Tests: Recent Labs    12/11/17 1702  TSH 4.376   Anemia Panel: No results for input(s): VITAMINB12, FOLATE, FERRITIN, TIBC, IRON, RETICCTPCT in the last 72 hours. Urine analysis:    Component Value Date/Time   COLORURINE YELLOW 12/11/2017 2006   APPEARANCEUR CLEAR 12/11/2017 2006   LABSPEC 1.014 12/11/2017 2006   PHURINE 5.0 12/11/2017 2006   GLUCOSEU NEGATIVE 12/11/2017 2006   HGBUR NEGATIVE 12/11/2017 2006   HGBUR negative 02/22/2008 0000   BILIRUBINUR NEGATIVE 12/11/2017 2006   BILIRUBINUR negative 06/28/2016 [place] 12/11/2017 2006   PROTEINUR NEGATIVE 12/11/2017 2006   UROBILINOGEN 0.2 06/28/2016 1657   UROBILINOGEN 0.2 12/03/2013 1942   NITRITE NEGATIVE 12/11/2017 2006   LEUKOCYTESUR NEGATIVE 12/11/2017 2006   Sepsis Labs: @LABRCNTIP (procalcitonin:4,lacticidven:4) )No results found for this or any previous visit (from the past 240 hour(s)).   Radiological Exams on Admission: Dg Chest 2 View  Result Date: 12/11/2017 CLINICAL DATA:  Fatigue, recent pneumonia EXAM: CHEST  2 VIEW COMPARISON:  12/05/2017 chest radiograph. FINDINGS: Stable configuration of 2 lead left subclavian pacemaker. Stable cardiomediastinal silhouette with top-normal heart size and small hiatal hernia. No pneumothorax. No pleural effusion. Mildly hyperinflated lungs. No pulmonary edema. No acute consolidative airspace disease. IMPRESSION: 1. Mildly hyperinflated lungs, which may indicate obstructive lung disease. 2. Lungs appear clear. 3. Stable small  hiatal hernia. Electronically Signed   By: Ilona Sorrel M.D.   On: 12/11/2017 17:43    EKG: Independently reviewed. V-paced.   Assessment/Plan  1. Acute kidney injury superimposed on CKD stage III  - Presents with lethargy and malaise, found to have SCr of 2.33, up from baseline of about 1.3  - Likely secondary to recent diuresis; she has lost 20 lbs over the past 1 week  - Treated in ED with 500 cc NS  - Continue gentle IVF hydration, hold diuretics, repeat chem panel in am    2. Chronic diastolic CHF  - Hypovolemic on admission  - Gently hydrating, following I/O's and daily wts    3. COPD  - Stable on admission  - Continue scheduled ICS and DuoNeb, prn albuterol    4. PAF  - In a paced-rhythm on admission   - CHADS-VASc at least 5 (age x2, gender, CHF, HTN)  - Continue Eliquis    5. Anxiety; insomnia  - Stable  - Continue prn Ativan and Restoril   6. Hypothyroidism  - TSH normal earlier today  - Continue Synthroid   7. Hypokalemia; hypocalcemia  - Serum potassium is 3.2 and calcium 6.6 with albumin of 3.4  - Likely secondary to diuretics, currently on hold  - Treated with 40 mEq oral potassium, 2 g IV magnesium, and 1 g IV calcium  - Repeat chemistries in am    8. Hypertension  - BP at goal, continue Norvasc and hydralazine  - Coreg recently stopped d/t bradycardia     DVT prophylaxis: Eliquis  Code Status: DNR Family Communication: Daughter updated at bedside Disposition Plan: Admit to med-surg Consults called: None Admission status: Inpatient    Vianne Bulls, MD [facility] Pager [phone]  If  7PM-7AM, please contact night-coverage [email or web address] Password [facility]  12/11/2017, 9:51 PM

## 2017-12-11 NOTE — ED Triage Notes (Signed)
Pt here for generalized weakness. Yesterday had appointment with Dr. Sharlene Motts who checked basic labs, and found her BUN was 65, and creatinine 2.4, K+ 3.3. Pt reports hasn't been eating or drinking. Sent here for admission for dehydration.

## 2017-12-11 NOTE — Telephone Encounter (Signed)
Can you document on this note and then close please? Looks like it is a duplicate note.

## 2017-12-11 NOTE — ED Provider Notes (Signed)
[facility] Provider Note   CSN: [id number] Arrival date & time: 12/11/17  1346     History   Chief Complaint No chief complaint on file. Fatigue, dehyration  HPI [name] is a 84 y.o. female.  The history is provided by the patient and medical records. No language interpreter was used.  Illness  This is a new problem. The current episode started more than 2 days ago. The problem occurs constantly. The problem has been gradually worsening. Pertinent negatives include no chest pain, no abdominal pain, no headaches and no shortness of breath. Nothing aggravates the symptoms. Nothing relieves the symptoms. She has tried nothing for the symptoms. The treatment provided no relief.    Past Medical History:  Diagnosis Date  . Anemia   . Anxiety   . Arthritis   . Asthma   . Benign positional vertigo   . Cardiac pacemaker in situ 05/01/2015   for syncope & Sx Bradycardia -> Medtronic MRI compatible pacemaker placed Dr. Caryl Comes   . Chronic diarrhea   . Chronic kidney disease, stage IV (severe) ([place])   . Colon polyps   . Diastolic dysfunction, left ventricle 11/03/2013  . Diverticulosis   . Gout   . Hemorrhoids   . Hyperlipidemia   . Hypertensive heart disease    no significant RAS by MRA 2002- (<30% LRAS)   . Hypothyroidism   . IBS (irritable bowel syndrome)   . LBBB (left bundle branch block)- new 11/01/13 11/01/2013  . Paroxysmal atrial fibrillation 11/01/2013   Intermittent through the years and recurrent in December of 2014 treated with amiodarone;; Negative Myoview 10/2013  . Peripheral vascular disease    79-02% LICA, 4-09% RICA by doppler 2009   . Sinus arrest 04/28/2015  . Syncope and collapse 04/28/2015   s/p PPM  . Tubulovillous adenoma 4/07    Patient Active Problem List   Diagnosis Date Noted  . Dyspnea 12/05/2017  . Hypoxia 12/05/2017  . Chronic back pain 07/16/2017  . Iron deficiency anemia 03/24/2017  . IDA  (iron deficiency anemia) 03/20/2017  . Dyspnea on exertion 02/21/2017  . Insomnia, persistent 07/26/2015  . Cardiac pacemaker in situ   . Femoral neck fracture ([place]) 05/01/2015  . Right bundle branch block (RBBB) intermittent  04/28/2015  . Closed left hip fracture ([place])   . Fall   . Syncope, cardiogenic   . Abnormal liver function tests 11/10/2014  . Systolic hypertension, isolated; difficult to control 10/04/2014  . CKD (chronic kidney disease), stage IV ([place]) 07/31/2014  . Gout 06/17/2014  . Edema of both legs 02/09/2014  . Encounter for long-term (current) use of other medications 12/25/2013  . Hypertensive heart/kidney disease w/chronic kidney disease stage IV ([place]) 12/25/2013  . Chest pain 12/03/2013  . Chronic diastolic heart failure ([place]) 12/03/2013  . H/O tachycardia-bradycardia syndrome   . Chronic anticoagulation- Eliquis 11/17/2013  . LBBB (left bundle branch block)- new 11/01/13 11/01/2013  . Persistent Paroxysmal atrial fibrillation: CHA2DS2-VASc Score 5, on Eliquis 11/01/2013  . Peripheral vascular disease   . Benign positional vertigo   . Irritable bowel syndrome 03/22/2009  . Anxiety   . Reactive airway disease 01/13/2008  . Chronic anemia   . Osteoarthritis   . Hypothyroidism   . Hyperlipidemia   . Hypertensive heart disease     Past Surgical History:  Procedure Laterality Date  . APPENDECTOMY    . CATARACT EXTRACTION    . EP IMPLANTABLE DEVICE N/A 05/01/2015   Procedure:  Pacemaker Implant;  Surgeon: Deboraha Sprang, MD;  Location: [facility];  Service: Cardiovascular;  Laterality: N/A;  . HIP ARTHROPLASTY Left 05/01/2015   Procedure: ARTHROPLASTY BIPOLAR HIP (HEMIARTHROPLASTY);  Surgeon: Rod Can, MD;  Location: [facility];  Service: Orthopedics;  Laterality: Left;  . LOW ANTERIOR BOWEL RESECTION  7/08  . [place] SURGERY  2000  . NM MYOVIEW LTD  03/18/2013   Negative for ischemia or infarction. EF 55%.  LOW RISK  . OOPHORECTOMY  1962   right   . squamous cell skin cancer removed    . TRANSTHORACIC ECHOCARDIOGRAM  10/2013, 02/2017   A) EF 55-60%, mild LVH, elevated bili pressures. Mild aortic valve calcification;; B) Normal LV size and function with mild LVH. Unable to assess diastolic function. Normal PA pressures. Mild MR,Mild AI    OB History    No data available       Home Medications    Prior to Admission medications   Medication Sig Start Date End Date Taking? Authorizing Provider  acetaminophen (TYLENOL) 500 MG tablet Take 1.5 tablets (750 mg total) by mouth every 6 (six) hours as needed. Patient taking differently: Take 725 mg by mouth every 6 (six) hours as needed for mild pain, fever or headache.  01/11/17   Mackuen, Courteney Lyn, MD  amLODipine (NORVASC) 5 MG tablet TAKE 1 TABLET (5 MG TOTAL) BY MOUTH DAILY. 03/21/17   [name], MD  benzonatate (TESSALON) 100 MG capsule Take 1 capsule (100 mg total) by mouth every 8 (eight) hours. Patient not taking: Reported on 12/10/2017 12/06/17   Bonnielee Haff, MD  [facility] VITAMIN B12 1000 MCG tablet TAKE 1 TABLET BY MOUTH EVERY DAY Patient taking differently: TAKE 1 TABLET (1082mcg) BY MOUTH EVERY DAY 05/02/17   [name], MD  ELIQUIS 2.5 MG TABS tablet TAKE 1 TABLET BY MOUTH TWICE A DAY Patient taking differently: TAKE 1 TABLET (2.5mg ) BY MOUTH TWICE A DAY 07/09/17   [name], MD  fluticasone (FLOVENT HFA) 110 MCG/ACT inhaler Inhale 2 puffs into the lungs 2 (two) times daily. 12/10/17   [name], MD  furosemide (LASIX) 40 MG tablet Take 40mg  daily for 4 days and then as before for more than 2-3 pounds overnight and/or 5 pounds in a week Patient not taking: Reported on 12/10/2017 12/06/17   Bonnielee Haff, MD  hydrALAZINE (APRESOLINE) 50 MG tablet Take 1 tablet (50 mg total) by mouth 2 (two) times daily. 04/14/17 12/05/17  [name], MD  Hyoscyamine Sulfate SL (LEVSIN/SL) 0.125 MG SUBL Take 1-2 tablets by mouth every 4 hours as needed Patient taking  differently: Take 0.125-0.25 mg by mouth every 4 (four) hours as needed (spasms).  05/05/17   Ladene Artist, MD  ipratropium-albuterol (DUONEB) 0.5-2.5 (3) MG/3ML SOLN Take 3 mLs by nebulization 3 (three) times daily. 12/06/17   Bonnielee Haff, MD  levothyroxine (SYNTHROID, LEVOTHROID) 88 MCG tablet TAKE 1 TABLET (88 MCG TOTAL) BY MOUTH DAILY BEFORE BREAKFAST. 05/16/17   [name], MD  LORazepam (ATIVAN) 0.5 MG tablet Take 1 tablet (0.5 mg total) by mouth every 8 (eight) hours as needed for anxiety. 12/10/17   [name], MD  nitroGLYCERIN (NITROSTAT) 0.4 MG SL tablet Place 1 tablet (0.4 mg total) under the tongue every 5 (five) minutes x 3 doses as needed for chest pain. 02/27/16   [name], MD  omeprazole (PRILOSEC) 20 MG capsule Take 1 capsule (20 mg total) by mouth daily.  05/05/17   Ladene Artist, MD  predniSONE (DELTASONE) 20 MG tablet Take 3 tablets once daily for 3 days, then take 2 tablets once daily for 3 days, then take 1 tablet once daily for 3 days and then STOP. Patient not taking: Reported on 12/10/2017 12/06/17   Bonnielee Haff, MD  temazepam (RESTORIL) 30 MG capsule TAKE ONE CAPSULE BY MOUTH AT BEDTIME *DRUG NOT COVERED* Patient taking differently: TAKE ONE CAPSULE (30mg ) BY MOUTH AT BEDTIME *DRUG NOT COVERED* 06/20/17   [name], MD  ULORIC 40 MG tablet Take 40 mg by mouth daily.  03/12/16   [provider]  UNABLE TO FIND Nebulizer machine for reactive airway disease. J45.909 12/06/17   Bonnielee Haff, MD    Family History Family History  Problem Relation Age of Onset  . Cancer Mother 50       unknown  . Heart attack Father   . Heart disease Brother   . Stroke Maternal Grandmother   . Lung cancer Maternal Grandfather   . Stroke Paternal Grandmother   . Colon cancer Daughter     Social History Social History   Tobacco Use  . Smoking status: Never Smoker  . Smokeless tobacco: Never Used  . Tobacco comment: never used tobacco  Substance Use  Topics  . Alcohol use: No    Alcohol/week: 0.0 oz  . Drug use: No     Allergies   Penicillins; Xifaxan [rifaximin]; Ambien [zolpidem tartrate]; Amoxicillin; Ceftin [cefuroxime axetil]; Codeine phosphate; Colchicine; Levothyroxine; and Lipitor [atorvastatin]   Review of Systems Review of Systems  Constitutional: Positive for fatigue. Negative for chills, diaphoresis and fever.  HENT: Negative for congestion.   Eyes: Negative for visual disturbance.  Respiratory: Negative for cough, chest tightness, shortness of breath, wheezing and stridor.   Cardiovascular: Negative for chest pain.  Gastrointestinal: Negative for abdominal pain, constipation, diarrhea, nausea and vomiting.  Genitourinary: Negative for dysuria and flank pain.  Musculoskeletal: Negative for back pain, neck pain and neck stiffness.  Neurological: Negative for weakness, light-headedness, numbness and headaches.  Psychiatric/Behavioral: Negative for agitation.  All other systems reviewed and are negative.    Physical Exam Updated Vital Signs BP 139/82 (BP Location: Left Arm)   Pulse 60   Temp 98 F (36.7 C) (Oral)   Resp 16   Wt 52.2 kg (115 lb)   SpO2 96%   BMI 21.03 kg/m   Physical Exam  Constitutional: She is oriented to person, place, and time. She appears well-developed and well-nourished. No distress.  HENT:  Head: Normocephalic.  Mouth/Throat: Oropharynx is clear and moist. No oropharyngeal exudate.  Eyes: Conjunctivae and EOM are normal. Pupils are equal, round, and reactive to light.  Neck: Normal range of motion.  Cardiovascular: Normal rate and intact distal pulses.  No murmur heard. Pulmonary/Chest: Effort normal. No respiratory distress. She has rhonchi in the right lower field. She exhibits no tenderness.  Abdominal: She exhibits no distension. There is no tenderness.  Musculoskeletal: She exhibits no tenderness.  Neurological: She is alert and oriented to person, place, and time. No  sensory deficit. She exhibits normal muscle tone.  Skin: Capillary refill takes less than 2 seconds. No rash noted. She is not diaphoretic. No erythema.  Psychiatric: She has a normal mood and affect.  Nursing note and vitals reviewed.    ED Treatments / Results  Labs (all labs ordered are listed, but only abnormal results are displayed) Labs Reviewed  BASIC METABOLIC PANEL - Abnormal; Notable for the  following components:      Result Value   Potassium 3.2 (*)    Chloride 93 (*)    BUN 67 (*)    Creatinine, Ser 2.33 (*)    Calcium 6.6 (*)    GFR calc non Af Amer 17 (*)    GFR calc Af Amer 20 (*)    Anion gap 16 (*)    All other components within normal limits  CBC - Abnormal; Notable for the following components:   WBC 10.7 (*)    All other components within normal limits  HEPATIC FUNCTION PANEL - Abnormal; Notable for the following components:   Total Protein 6.2 (*)    Albumin 3.4 (*)    All other components within normal limits  BRAIN NATRIURETIC PEPTIDE - Abnormal; Notable for the following components:   B Natriuretic Peptide 114.4 (*)    All other components within normal limits  URINE CULTURE  URINALYSIS, ROUTINE W REFLEX MICROSCOPIC  TSH  MAGNESIUM  BASIC METABOLIC PANEL  CBC WITH DIFFERENTIAL/PLATELET  CBG MONITORING, ED    EKG  EKG Interpretation  Date/Time:  Thursday December 11 2017 14:35:15 EST Ventricular Rate:  62 PR Interval:    QRS Duration: 142 QT Interval:  506 QTC Calculation: 513 R Axis:   -83 Text Interpretation:  Ventricular-paced rhythm Abnormal ECG When compared to prior, similar paced rhythm however T waves not inverted in leads V1 and V2.  No STEMI Confirmed by Antony Blackbird [phone]) on 12/11/2017 4:59:35 PM       Radiology Dg Chest 2 View  Result Date: 12/11/2017 CLINICAL DATA:  Fatigue, recent pneumonia EXAM: CHEST  2 VIEW COMPARISON:  12/05/2017 chest radiograph. FINDINGS: Stable configuration of 2 lead left subclavian pacemaker.  Stable cardiomediastinal silhouette with top-normal heart size and small hiatal hernia. No pneumothorax. No pleural effusion. Mildly hyperinflated lungs. No pulmonary edema. No acute consolidative airspace disease. IMPRESSION: 1. Mildly hyperinflated lungs, which may indicate obstructive lung disease. 2. Lungs appear clear. 3. Stable small hiatal hernia. Electronically Signed   By: Ilona Sorrel M.D.   On: 12/11/2017 17:43    Procedures Procedures (including critical care time)  Medications Ordered in ED Medications  0.9 %  sodium chloride infusion ( Intravenous New Bag/Given 12/11/17 2131)  amLODipine (NORVASC) tablet 5 mg (5 mg Oral Given 12/11/17 2232)  benzonatate (TESSALON) capsule 100 mg (not administered)  vitamin B-12 (CYANOCOBALAMIN) tablet 1,000 mcg (not administered)  apixaban (ELIQUIS) tablet 2.5 mg (2.5 mg Oral Given 12/11/17 2233)  hydrALAZINE (APRESOLINE) tablet 50 mg (not administered)  ipratropium-albuterol (DUONEB) 0.5-2.5 (3) MG/3ML nebulizer solution 3 mL (3 mLs Nebulization Given 12/11/17 2241)  levothyroxine (SYNTHROID, LEVOTHROID) tablet 88 mcg (not administered)  LORazepam (ATIVAN) tablet 0.5 mg (not administered)  pantoprazole (PROTONIX) EC tablet 40 mg (not administered)  temazepam (RESTORIL) capsule 30 mg (30 mg Oral Given 12/11/17 2241)  febuxostat (ULORIC) tablet 40 mg (not administered)  acetaminophen (TYLENOL) tablet 650 mg (not administered)    Or  acetaminophen (TYLENOL) suppository 650 mg (not administered)  HYDROcodone-acetaminophen (NORCO/VICODIN) 5-325 MG per tablet 1-2 tablet (not administered)  senna-docusate (Senokot-S) tablet 1 tablet (not administered)  bisacodyl (DULCOLAX) EC tablet 5 mg (not administered)  ondansetron (ZOFRAN) tablet 4 mg (not administered)    Or  ondansetron (ZOFRAN) injection 4 mg (not administered)  budesonide (PULMICORT) nebulizer solution 0.25 mg (0.25 mg Nebulization Given 12/11/17 2241)  hyoscyamine (LEVSIN SL) SL tablet  0.125-0.25 mg (not administered)  feeding supplement (ENSURE ENLIVE) (ENSURE ENLIVE) liquid 237 mL (  not administered)  sodium chloride 0.9 % bolus 500 mL (0 mLs Intravenous Stopped 12/11/17 1900)  magnesium sulfate IVPB 2 g 50 mL (0 g Intravenous Stopped 12/11/17 2335)  calcium gluconate 1 g in sodium chloride 0.9 % 100 mL IVPB (0 g Intravenous Stopped 12/12/17 0105)  potassium chloride SA (K-DUR,KLOR-CON) CR tablet 40 mEq (40 mEq Oral Given 12/11/17 2239)     Initial Impression / Assessment and Plan / ED Course  I have reviewed the triage vital signs and the nursing notes.  Pertinent labs & imaging results that were available during my care of the patient were reviewed by me and considered in my medical decision making (see chart for details).     CHANEKA TREFZ is a 84 y.o. female with a past medical history significant for hypertension, hyperlipidemia, hypothyroidism, vertigo, atrial fibrillation on Eliquis therapy, and CHF with pacemaker with recent admission for CHF exacerbation and fluid overload discharged 5 days ago who presents at the direction of her PCP for admission for dehydration.  Patient is coming by family who reports that since her discharge 5 days ago, patient has not been eating and drinking.  She has had no appetite.  She is felt very tired and fatigued.  She reports improvement in her breathing and has had decreased amount of congestion and cough.  She reports that she took antibiotics and steroids to help her breathing as well as the Lasix for fluid overload.  She reports that she has had continued urination and has had no changes as far as dysuria or hematuria.  She denies constipation, diarrhea.  She denies any chest pain or abdominal pain.  She reports that she went to see her PCP yesterday for the increased fatigue and decreased appetite.  At that time, laboratory testing was performed and patient was found to have acute kidney injury.  After the lab work returned, the PCP  called her today to recommend coming to the ED for admission for rehydration.   On exam, patient had no significant edema in her legs.  Patient symmetric pulses in all extremities.  Patient's chest was nontender and abdomen was nontender.  Patient's lungs had decreased breath sounds and rhonchi in the right lower lung fields.  Patient had no focal neurologic deficits.  Patient's right pupil is irregular and she prior.  EKG showed paced rhythm similar to prior however there was some new T wave inversions in leads V1 and V2.  Patient's laboratory testing was reviewed from yesterday showing a jump in her creatinine to 2.4 up from 1.5 at previous discharge.  As this was the concern for the patient's PCP to send her here for admission, patient will be given some fluids during her initial workup.  Patient will have screening laboratory testing to look for other occult infection or worsening pneumonia.  Next  Anticipate admission for rehydration after workup is completed given the patient's delicate fluid balance of recent CHF exacerbation and now acute kidney injury from dehydration.  8:55 PM Urinalysis returned showing no evidence of UTI.  Hepatic function reassuring.  BNP 114 which is improved from prior.  TSH is normal.  Chest x-ray shows no pneumonia with clear lungs.  Hospitalist team will be called for admission due to the acute kidney injury in the setting of dehydration and fluid balance problems.    Final Clinical Impressions(s) / ED Diagnoses   Final diagnoses:  AKI (acute kidney injury) [facility])    ED Discharge Orders    None  Clinical Impression: 1. AKI (acute kidney injury) ([place])     Disposition: Admit  This note was prepared with assistance of Dragon voice recognition software. Occasional wrong-word or sound-a-like substitutions may have occurred due to the inherent limitations of voice recognition software.      Tegeler, Gwenyth Allegra, MD 12/12/17 [phone]

## 2017-12-12 DIAGNOSIS — L8990 Pressure ulcer of unspecified site, unspecified stage: Secondary | ICD-10-CM

## 2017-12-12 DIAGNOSIS — R262 Difficulty in walking, not elsewhere classified: Secondary | ICD-10-CM

## 2017-12-12 LAB — CBC WITH DIFFERENTIAL/PLATELET
Basophils Absolute: 0 10*3/uL (ref 0.0–0.1)
Basophils Relative: 0 %
EOS ABS: 0 10*3/uL (ref 0.0–0.7)
Eosinophils Relative: 1 %
HCT: 35.7 % — ABNORMAL LOW (ref 36.0–46.0)
Hemoglobin: 12.1 g/dL (ref 12.0–15.0)
LYMPHS ABS: 1.1 10*3/uL (ref 0.7–4.0)
Lymphocytes Relative: 18 %
MCH: 33.1 pg (ref 26.0–34.0)
MCHC: 33.9 g/dL (ref 30.0–36.0)
MCV: 97.5 fL (ref 78.0–100.0)
MONO ABS: 0.7 10*3/uL (ref 0.1–1.0)
MONOS PCT: 11 %
Neutro Abs: 4.4 10*3/uL (ref 1.7–7.7)
Neutrophils Relative %: 70 %
PLATELETS: 163 10*3/uL (ref 150–400)
RBC: 3.66 MIL/uL — ABNORMAL LOW (ref 3.87–5.11)
RDW: 14 % (ref 11.5–15.5)
WBC: 6.3 10*3/uL (ref 4.0–10.5)

## 2017-12-12 LAB — BASIC METABOLIC PANEL
Anion gap: 12 (ref 5–15)
BUN: 58 mg/dL — AB (ref 6–20)
CHLORIDE: 102 mmol/L (ref 101–111)
CO2: 28 mmol/L (ref 22–32)
CREATININE: 1.95 mg/dL — AB (ref 0.44–1.00)
Calcium: 6.7 mg/dL — ABNORMAL LOW (ref 8.9–10.3)
GFR calc Af Amer: 25 mL/min — ABNORMAL LOW (ref >60)
GFR calc non Af Amer: 21 mL/min — ABNORMAL LOW (ref >60)
Glucose, Bld: 102 mg/dL — ABNORMAL HIGH (ref 65–99)
Potassium: 3.2 mmol/L — ABNORMAL LOW (ref 3.5–5.1)
Sodium: 142 mmol/L (ref 135–145)

## 2017-12-12 LAB — MAGNESIUM: Magnesium: 2 mg/dL (ref 1.7–2.4)

## 2017-12-12 MED ORDER — SODIUM CHLORIDE 0.9 % IV SOLN
INTRAVENOUS | Status: DC
  Administered 2017-12-12 – 2017-12-16 (×5): via INTRAVENOUS

## 2017-12-12 MED ORDER — ADULT MULTIVITAMIN W/MINERALS CH
1.0000 | ORAL_TABLET | Freq: Every day | ORAL | Status: DC
  Administered 2017-12-12 – 2017-12-17 (×6): 1 via ORAL
  Filled 2017-12-12 (×6): qty 1

## 2017-12-12 MED ORDER — POTASSIUM CHLORIDE CRYS ER 20 MEQ PO TBCR
40.0000 meq | EXTENDED_RELEASE_TABLET | Freq: Two times a day (BID) | ORAL | Status: AC
  Administered 2017-12-12 (×2): 40 meq via ORAL
  Filled 2017-12-12 (×2): qty 2

## 2017-12-12 NOTE — Discharge Instructions (Addendum)
You must weigh yourself daily and decide on whether to take Lasix based on your weights. Do not drink more than 1.2 liters or liquid a day.    Information on my medicine - ELIQUIS (apixaban)  Why was Eliquis prescribed for you? Eliquis was prescribed for you to reduce the risk of a blood clot forming that can cause a stroke if you have a medical condition called atrial fibrillation (a type of irregular heartbeat).  What do You need to know about Eliquis ? Take your Eliquis TWICE DAILY - one tablet in the morning and one tablet in the evening with or without food. If you have difficulty swallowing the tablet whole please discuss with your pharmacist how to take the medication safely.  Take Eliquis exactly as prescribed by your doctor and DO NOT stop taking Eliquis without talking to the doctor who prescribed the medication.  Stopping may increase your risk of developing a stroke.  Refill your prescription before you run out.  After discharge, you should have regular check-up appointments with your healthcare provider that is prescribing your Eliquis.  In the future your dose may need to be changed if your kidney function or weight changes by a significant amount or as you get older.  What do you do if you miss a dose? If you miss a dose, take it as soon as you remember on the same day and resume taking twice daily.  Do not take more than one dose of ELIQUIS at the same time to make up a missed dose.  Important Safety Information A possible side effect of Eliquis is bleeding. You should call your healthcare provider right away if you experience any of the following: ? Bleeding from an injury or your nose that does not stop. ? Unusual colored urine (red or dark brown) or unusual colored stools (red or black). ? Unusual bruising for unknown reasons. ? A serious fall or if you hit your head (even if there is no bleeding).  Some medicines may interact with Eliquis and might increase your  risk of bleeding or clotting while on Eliquis. To help avoid this, consult your healthcare provider or pharmacist prior to using any new prescription or non-prescription medications, including herbals, vitamins, non-steroidal anti-inflammatory drugs (NSAIDs) and supplements.  This website has more information on Eliquis (apixaban): http://www.eliquis.com/eliquis/home  Nutrition [facility] Stay Proper nutrition can help your body recover from illness and injury.   Foods and beverages high in protein, vitamins, and minerals help rebuild muscle loss, promote healing, & reduce fall risk.   In addition to eating healthy foods, a nutrition shake is an easy, delicious way to get the nutrition you need during and after your hospital stay  It is recommended that you continue to drink 2 bottles per day of:       Ensure for at least 1 month (30 days) after your hospital stay   Tips for adding a nutrition shake into your routine: As allowed, drink one with vitamins or medications instead of water or juice Enjoy one as a tasty mid-morning or afternoon snack Drink cold or make a milkshake out of it Drink one instead of milk with cereal or snacks Use as a coffee creamer   Available at the following grocery stores and pharmacies:           * [place]  *  [facility]      * [employer]  * [employer]   * [facility]  * [employer]   * [place] visit: www.ensure.com/join or http://dawson-may.com/   Suggested Substitutions Ensure Plus = Boost Plus = Carnation Breakfast Essentials = Boost Compact Ensure Active Clear = Boost Breeze Glucerna Shake = Boost Glucose Control = Carnation Breakfast Essentials SUGAR FREE

## 2017-12-12 NOTE — Progress Notes (Signed)
Initial Nutrition Assessment  DOCUMENTATION CODES:   Non-severe (moderate) malnutrition in context of chronic illness  INTERVENTION:   -MVI daily -Ensure Enlive po BID, each supplement provides 350 kcal and 20 grams of protein  NUTRITION DIAGNOSIS:   Moderate Malnutrition related to chronic illness(COPD, CHF) as evidenced by energy intake < 75% for > or equal to 1 month, mild fat depletion, moderate fat depletion, mild muscle depletion, moderate muscle depletion.  GOAL:   Patient will meet greater than or equal to 90% of their needs  MONITOR:   PO intake, Supplement acceptance, Labs, Weight trends, Skin, I & O's  REASON FOR ASSESSMENT:   Malnutrition Screening Tool, Consult Assessment of nutrition requirement/status  ASSESSMENT:   [name] is a 84 y.o. female with medical history significant for asthma/COPD, chronic diastolic CHF, symptomatic bradycardia with pacer, and chronic kidney disease stage III-IV, now presenting to the emergency department for evaluation of lethargy and malaise with 20 pound weight loss over the past week.   Pt admitted with AKI superimposed on CKD 3.   Spoke with pt, who reports a general decline in health over the past month. She shares she has experienced very poor po's over the past month related to congestion poor appetite, and unable to taste food.   Typical intake consists of Breakfast: grits, eggs, and cheese toast: snack of oatmeal cookie or peanut butter crackers, Dinner: baked chicken, baked potato, and side salad. She reports drinking mainly water. She has been contemplating consuming Boost at home.  Pt consumed very little breakfast this AM. She shares the has been consuming less than 50% of meals over the past month, despite encouragement from family. Pt is typically very active, but has been very weak. She is considering moving in with her son temporarily.   Per pt, UBW around134#. Pt has experienced an 8.2% wt loss over the past  month.   Discussed importance of good meal and supplement intake to promote healing. Pt amenable to Ensure supplements.   Labs reviewed: K: 3.2 (on PO supplementation).   NUTRITION - FOCUSED PHYSICAL EXAM:    Most Recent Value  Orbital Region  No depletion  Upper Arm Region  Moderate depletion  Thoracic and Lumbar Region  Moderate depletion  Buccal Region  No depletion  Temple Region  Mild depletion  Clavicle Bone Region  Moderate depletion  Clavicle and Acromion Bone Region  Moderate depletion  Scapular Bone Region  Moderate depletion  Dorsal Hand  Moderate depletion  Patellar Region  Moderate depletion  Anterior Thigh Region  Mild depletion  Posterior Calf Region  Moderate depletion  Edema (RD Assessment)  Mild  Hair  Reviewed  Eyes  Reviewed  Mouth  Reviewed  Skin  Reviewed  Nails  Reviewed       Diet Order:  Diet Heart Room service appropriate? Yes; Fluid consistency: Thin Fall precautions  EDUCATION NEEDS:   Education needs have been addressed  Skin:  Skin Assessment: Skin Integrity Issues: Skin Integrity Issues:: Stage I Stage I: sacrum  Last BM:  PTA  Height:   Ht Readings from Last 1 Encounters:  12/11/17 5\' 2"  (1.575 m)    Weight:   Wt Readings from Last 1 Encounters:  12/12/17 122 lb 12.7 oz (55.7 kg)    Ideal Body Weight:  50 kg  BMI:  Body mass index is 22.46 kg/m.  Estimated Nutritional Needs:   Kcal:  1500-1700  Protein:  65-80 grams  Fluid:  1.5-1.7 L    [name]  Rosalie Doctor, RD, LDN, CDE Pager: [phone] After hours Pager: [phone]

## 2017-12-12 NOTE — Telephone Encounter (Signed)
Called and spoke to pt's son and the Home health nurse and advised them that the pt does need to go to the ER today. Home health nurse was advised and voiced understanding. Went over PCP abnormal lab results as well.

## 2017-12-12 NOTE — Progress Notes (Signed)
PROGRESS NOTE  [name] WIO:[id number] DOB: [birth year] DOA: 12/11/2017 PCP: [name], MD  HPI/Recap of past 24 hours: Patient is an 85 y.o.femalefrom homewithpast medicalhistory ofchronicatrial fibrillationon eliquis, childhood asthma, chronic diastolic heart failure with EF 55-60% (2014) with multiple recent admissions for persistent dyspnea with hypoxia who presented with same complaints associated with general malaise, 20 lbs weight loss and poor oral intake.  12/12/17: Patient seen and examined with no family members at her bedside. She reports poor oral intake. Denies chest pain or dyspnea. States she would like to regain all the weight she has lost before returning to her home. She is alert and oriented x 3.   Assessment/Plan: Principal Problem:   Acute kidney injury superimposed on CKD (HCC) Active Problems:   Hypothyroidism   Persistent Paroxysmal atrial fibrillation: CHA2DS2-VASc Score 5, on Eliquis   Chronic diastolic heart failure (HCC)   Hypokalemia   COPD (chronic obstructive pulmonary disease) (HCC)   Hypocalcemia   Pressure injury of skin  Acute kidney injury superimposed on CKD stage III  - SCr of 2.33, up from baseline of about 1.3  - Likely secondary to recent diuresis; she has lost 20 lbs over the past 1 week  - Treated in ED with 500 cc NS  - Continue gentle IVF hydration, hold diuretics, repeat chem panel in am   Generalized weakness/fatigue/physical deconditioning - PT to evaluate - increase oral intake - oral supplements  - fall precaution - dietitian consult  Moderate protein calorie malnutrition - management as stated above  Chronic diastolic CHF EF 42-68% (3419) - Hypovolemic on admission  - Gently hydrating -  I/O's and daily wts    COPD  - Stable on admission  - Continue scheduled ICS and DuoNeb, prn albuterol    PAF  - In a paced-rhythm on admission   - CHADS-VASc at least 5 (age x2, gender, CHF, HTN)  - Continue  Eliquis    Anxiety; insomnia  - Stable  - Continue prn Ativan and Restoril   Hypothyroidism  - TSH normal earlier today  - Continue Synthroid   Hypokalemia; hypocalcemia  - Serum potassium is 3.2 and calcium 6.6 with albumin of 3.4  - Likely secondary to diuretics, currently on hold  - Treated with 40 mEq oral potassium, 2 g IV magnesium, and 1 g IV calcium  - Repeat chemistries in am    Hypertension  - BP at goal, continue Norvasc and hydralazine  - Coreg recently stopped d/t bradycardia      Code Status: DNR  Family Communication: none at bedside  Disposition Plan: Discharge to home when stable   Consultants:  none  Procedures:  none  Antimicrobials:  none  DVT prophylaxis:  none   Objective: Vitals:   12/11/17 2232 12/11/17 2235 12/11/17 2354 12/12/17 0530  BP: 120/71 120/71 130/60 (!) 113/55  Pulse:  61 68 60  Resp:  18 18 18   Temp:  98.2 F (36.8 C) (!) 97.4 F (36.3 C) (!) 97.4 F (36.3 C)  TempSrc:  Oral Oral Oral  SpO2:  95% 93% 93%  Weight:   55.5 kg (122 lb 5.7 oz) 55.7 kg (122 lb 12.7 oz)  Height:   5\' 2"  (1.575 m)     Intake/Output Summary (Last 24 hours) at 12/12/2017 0900 Last data filed at 12/12/2017 0600 Gross per 24 hour  Intake 1163 ml  Output 400 ml  Net 763 ml   Filed Weights   12/11/17 1423 12/11/17 2354 12/12/17  0530  Weight: 52.2 kg (115 lb) 55.5 kg (122 lb 5.7 oz) 55.7 kg (122 lb 12.7 oz)    Exam:   General:  84 yo CF emaciated in NAD A&O x3   Cardiovascular: IRR no rubs or gallops  Respiratory: CTA no wheezes or rales   Abdomen: soft NT ND NBS x4  Musculoskeletal: non focal. No LE edema  Skin: no rash noted  Psychiatry: Mood appropriate for condition and setting.   Data Reviewed: CBC: Recent Labs  Lab 12/06/17 0319 12/11/17 1430 12/12/17 0537  WBC 1.6* 10.7* 6.3  NEUTROABS  --   --  4.4  HGB 11.8* 14.8 12.1  HCT 36.0 44.0 35.7*  MCV 96.0 95.7 97.5  PLT 179 206 702   Basic Metabolic  Panel: Recent Labs  Lab 12/06/17 0319 12/10/17 1403 12/11/17 1430 12/12/17 0537  NA 140 141 141 142  K 3.9 3.3* 3.2* 3.2*  CL 100* 90* 93* 102  CO2 28 35* 32 28  GLUCOSE 151* 122* 96 102*  BUN 34* 65* 67* 58*  CREATININE 1.53* 2.40* 2.33* 1.95*  CALCIUM 8.4* 7.0* 6.6* 6.7*  MG  --   --   --  2.0   GFR: Estimated Creatinine Clearance: 14.9 mL/min (A) (by C-G formula based on SCr of 1.95 mg/dL (H)). Liver Function Tests: Recent Labs  Lab 12/11/17 1702  AST 31  ALT 24  ALKPHOS 86  BILITOT 0.8  PROT 6.2*  ALBUMIN 3.4*   No results for input(s): LIPASE, AMYLASE in the last 168 hours. No results for input(s): AMMONIA in the last 168 hours. Coagulation Profile: No results for input(s): INR, PROTIME in the last 168 hours. Cardiac Enzymes: Recent Labs  Lab 12/05/17 1708 12/05/17 2234  TROPONINI <0.03 <0.03   BNP (last 3 results) No results for input(s): PROBNP in the last 8760 hours. HbA1C: No results for input(s): HGBA1C in the last 72 hours. CBG: No results for input(s): GLUCAP in the last 168 hours. Lipid Profile: No results for input(s): CHOL, HDL, LDLCALC, TRIG, CHOLHDL, LDLDIRECT in the last 72 hours. Thyroid Function Tests: Recent Labs    12/11/17 1702  TSH 4.376   Anemia Panel: No results for input(s): VITAMINB12, FOLATE, FERRITIN, TIBC, IRON, RETICCTPCT in the last 72 hours. Urine analysis:    Component Value Date/Time   COLORURINE YELLOW 12/11/2017 2006   APPEARANCEUR CLEAR 12/11/2017 2006   LABSPEC 1.014 12/11/2017 2006   PHURINE 5.0 12/11/2017 2006   GLUCOSEU NEGATIVE 12/11/2017 2006   HGBUR NEGATIVE 12/11/2017 2006   HGBUR negative 02/22/2008 0000   BILIRUBINUR NEGATIVE 12/11/2017 2006   BILIRUBINUR negative 06/28/2016 [place] 12/11/2017 2006   PROTEINUR NEGATIVE 12/11/2017 2006   UROBILINOGEN 0.2 06/28/2016 1657   UROBILINOGEN 0.2 12/03/2013 1942   NITRITE NEGATIVE 12/11/2017 2006   LEUKOCYTESUR NEGATIVE 12/11/2017 2006    Sepsis Labs: @LABRCNTIP (procalcitonin:4,lacticidven:4)  )No results found for this or any previous visit (from the past 240 hour(s)).    Studies: Dg Chest 2 View  Result Date: 12/11/2017 CLINICAL DATA:  Fatigue, recent pneumonia EXAM: CHEST  2 VIEW COMPARISON:  12/05/2017 chest radiograph. FINDINGS: Stable configuration of 2 lead left subclavian pacemaker. Stable cardiomediastinal silhouette with top-normal heart size and small hiatal hernia. No pneumothorax. No pleural effusion. Mildly hyperinflated lungs. No pulmonary edema. No acute consolidative airspace disease. IMPRESSION: 1. Mildly hyperinflated lungs, which may indicate obstructive lung disease. 2. Lungs appear clear. 3. Stable small hiatal hernia. Electronically Signed   By: Janina Mayo.D.  On: 12/11/2017 17:43    Scheduled Meds: . amLODipine  5 mg Oral Daily  . apixaban  2.5 mg Oral BID  . budesonide (PULMICORT) nebulizer solution  0.25 mg Nebulization BID  . febuxostat  40 mg Oral Daily  . feeding supplement (ENSURE ENLIVE)  237 mL Oral BID BM  . hydrALAZINE  50 mg Oral BID  . ipratropium-albuterol  3 mL Nebulization TID  . levothyroxine  88 mcg Oral QAC breakfast  . pantoprazole  40 mg Oral Daily  . cyanocobalamin  1,000 mcg Oral Daily    Continuous Infusions: . sodium chloride 90 mL/hr at 12/11/17 2131     LOS: 1 day     Kayleen Memos, MD [facility] Pager [phone]  If 7PM-7AM, please contact night-coverage [email or web address] Password [email or web address] 12/12/2017, 9:00 AM

## 2017-12-13 DIAGNOSIS — E440 Moderate protein-calorie malnutrition: Secondary | ICD-10-CM

## 2017-12-13 LAB — CBC
HCT: 38.1 % (ref 36.0–46.0)
Hemoglobin: 12.4 g/dL (ref 12.0–15.0)
MCH: 32.2 pg (ref 26.0–34.0)
MCHC: 32.5 g/dL (ref 30.0–36.0)
MCV: 99 fL (ref 78.0–100.0)
Platelets: 178 10*3/uL (ref 150–400)
RBC: 3.85 MIL/uL — ABNORMAL LOW (ref 3.87–5.11)
RDW: 14.7 % (ref 11.5–15.5)
WBC: 7.9 10*3/uL (ref 4.0–10.5)

## 2017-12-13 LAB — RENAL FUNCTION PANEL
Albumin: 3.2 g/dL — ABNORMAL LOW (ref 3.5–5.0)
Anion gap: 12 (ref 5–15)
BUN: 48 mg/dL — ABNORMAL HIGH (ref 6–20)
CO2: 24 mmol/L (ref 22–32)
Calcium: 7.5 mg/dL — ABNORMAL LOW (ref 8.9–10.3)
Chloride: 106 mmol/L (ref 101–111)
Creatinine, Ser: 1.98 mg/dL — ABNORMAL HIGH (ref 0.44–1.00)
GFR calc Af Amer: 24 mL/min — ABNORMAL LOW (ref >60)
GFR calc non Af Amer: 21 mL/min — ABNORMAL LOW (ref >60)
Glucose, Bld: 138 mg/dL — ABNORMAL HIGH (ref 65–99)
Phosphorus: 2.5 mg/dL (ref 2.5–4.6)
Potassium: 4.5 mmol/L (ref 3.5–5.1)
Sodium: 142 mmol/L (ref 135–145)

## 2017-12-13 LAB — URINE CULTURE: Culture: NO GROWTH

## 2017-12-13 LAB — ALBUMIN: Albumin: 3.1 g/dL — ABNORMAL LOW (ref 3.5–5.0)

## 2017-12-13 LAB — PHOSPHORUS: Phosphorus: 2.4 mg/dL — ABNORMAL LOW (ref 2.5–4.6)

## 2017-12-13 MED ORDER — MEGESTROL ACETATE 400 MG/10ML PO SUSP
400.0000 mg | Freq: Two times a day (BID) | ORAL | Status: DC
  Administered 2017-12-13 – 2017-12-17 (×8): 400 mg via ORAL
  Filled 2017-12-13 (×8): qty 10

## 2017-12-13 NOTE — Discharge Summary (Deleted)
Patient was discharged before I could see the patient. The patient may have been discharged by the Surgery team, Discharge summary as per the Team that discharged the patient.  NB: I have not made any contact with this patient on this admission.

## 2017-12-13 NOTE — Evaluation (Signed)
Physical Therapy Evaluation Patient Details Name: [name] MRN: [id number] DOB: 10/10/1924 Today's Date: 12/13/2017   History of Present Illness  84 y.o. female with medical history significant for asthma/COPD, chronic diastolic CHF, symptomatic bradycardia with pacer, and chronic kidney disease stage III-IV, now presenting to the emergency department for evaluation of lethargy and malaise with 20 pound weight loss over the past week.  She had 2 recent prior hospitalizations in Jan 2019.     Clinical Impression  Pt admitted with above diagnosis. Pt currently with functional limitations due to the deficits listed below (see PT Problem List). Prior to recent illness and multi hospitalizations, pt lived at home alone independent with all functional mobility. On eval, she demonstrated modified independence with bed mobility. Min guard assist needed for transfers and ambulation 200 feet pushing IV pole. She demonstrates decreased balance and activity tolerance. Pt will benefit from skilled PT to increase their independence and safety with mobility to allow discharge to the venue listed below.       Follow Up Recommendations Home health PT;Supervision - Intermittent    Equipment Recommendations  None recommended by PT    Recommendations for Other Services       Precautions / Restrictions Precautions Precautions: Fall Restrictions Weight Bearing Restrictions: No      Mobility  Bed Mobility Overal bed mobility: Modified Independent                Transfers Overall transfer level: Needs assistance Equipment used: None Transfers: Sit to/from Stand Sit to Stand: Min guard         General transfer comment: min guard for safety  Ambulation/Gait Ambulation/Gait assistance: Min guard Ambulation Distance (Feet): 200 Feet Assistive device: (pushing IV pole) Gait Pattern/deviations: Step-through pattern;Decreased stride length Gait velocity: decreased Gait velocity  interpretation: Below normal speed for age/gender General Gait Details: Pt pushed IV pole for stability. No LOB noted. No physical assist needed.  Stairs            Wheelchair Mobility    Modified Rankin (Stroke Patients Only)       Balance Overall balance assessment: Needs assistance Sitting-balance support: Feet supported;No upper extremity supported Sitting balance-Leahy Scale: Good     Standing balance support: During functional activity;No upper extremity supported Standing balance-Leahy Scale: Fair                               Pertinent Vitals/Pain Pain Assessment: No/denies pain    Home Living Family/patient expects to be discharged to:: Private residence(will probably go home with her son and DIL for a few days)) Living Arrangements: Alone Available Help at Discharge: Family;Available 24 hours/day Type of Home: Other(Comment)(townhouse) Home Access: Level entry     Home Layout: One level Home Equipment: Walker - 2 wheels;Cane - single point      Prior Function Level of Independence: Independent               Hand Dominance   Dominant Hand: Right    Extremity/Trunk Assessment   Upper Extremity Assessment Upper Extremity Assessment: Overall WFL for tasks assessed    Lower Extremity Assessment Lower Extremity Assessment: Generalized weakness    Cervical / Trunk Assessment Cervical / Trunk Assessment: Kyphotic  Communication   Communication: No difficulties  Cognition Arousal/Alertness: Awake/alert Behavior During Therapy: WFL for tasks assessed/performed Overall Cognitive Status: Within Functional Limits for tasks assessed  General Comments      Exercises     Assessment/Plan    PT Assessment Patient needs continued PT services  PT Problem List Decreased balance;Decreased mobility;Decreased coordination;Decreased activity tolerance;Decreased strength       PT  Treatment Interventions DME instruction;Gait training;Stair training;Functional mobility training;Therapeutic activities;Therapeutic exercise;Balance training;Neuromuscular re-education;Patient/family education    PT Goals (Current goals can be found in the Care Plan section)  Acute Rehab PT Goals Patient Stated Goal: return home PT Goal Formulation: With patient Time For Goal Achievement: 12/27/17 Potential to Achieve Goals: Good    Frequency Min 3X/week   Barriers to discharge        Co-evaluation               AM-PAC PT "6 Clicks" Daily Activity  Outcome Measure Difficulty turning over in bed (including adjusting bedclothes, sheets and blankets)?: None Difficulty moving from lying on back to sitting on the side of the bed? : None Difficulty sitting down on and standing up from a chair with arms (e.g., wheelchair, bedside commode, etc,.)?: A Little Help needed moving to and from a bed to chair (including a wheelchair)?: None Help needed walking in hospital room?: A Little Help needed climbing 3-5 steps with a railing? : A Lot 6 Click Score: 20    End of Session Equipment Utilized During Treatment: Gait belt Activity Tolerance: Patient tolerated treatment well Patient left: in chair;with call bell/phone within reach;with family/visitor present Nurse Communication: Mobility status PT Visit Diagnosis: Unsteadiness on feet (R26.81);Other abnormalities of gait and mobility (R26.89);Muscle weakness (generalized) (M62.81)    Time: 8329-1916 PT Time Calculation (min) (ACUTE ONLY): 14 min   Charges:   PT Evaluation $PT Eval Low Complexity: 1 Low     PT G Codes:        Lorrin Goodell, PT  Office # [phone] Pager [phone]   [name] 12/13/2017, 12:15 PM

## 2017-12-14 LAB — RENAL FUNCTION PANEL
Albumin: 2.5 g/dL — ABNORMAL LOW (ref 3.5–5.0)
Anion gap: 9 (ref 5–15)
BUN: 49 mg/dL — ABNORMAL HIGH (ref 6–20)
CO2: 25 mmol/L (ref 22–32)
Calcium: 7.3 mg/dL — ABNORMAL LOW (ref 8.9–10.3)
Chloride: 108 mmol/L (ref 101–111)
Creatinine, Ser: 1.72 mg/dL — ABNORMAL HIGH (ref 0.44–1.00)
GFR calc Af Amer: 29 mL/min — ABNORMAL LOW (ref >60)
GFR calc non Af Amer: 25 mL/min — ABNORMAL LOW (ref >60)
Glucose, Bld: 124 mg/dL — ABNORMAL HIGH (ref 65–99)
Phosphorus: 2.6 mg/dL (ref 2.5–4.6)
Potassium: 4.4 mmol/L (ref 3.5–5.1)
Sodium: 142 mmol/L (ref 135–145)

## 2017-12-14 LAB — CALCIUM, IONIZED: Calcium, Ionized, Serum: 4 mg/dL — ABNORMAL LOW (ref 4.5–5.6)

## 2017-12-14 NOTE — Progress Notes (Signed)
PROGRESS NOTE  [name]:[id number] DOB: 1924/02/27 DOA: 12/11/2017 PCP: [name], MD  HPI/Recap of past 24 hours: Patient is an 84 y.o.femalefrom homewithpast medicalhistory ofchronicatrial fibrillationon eliquis, childhood asthma, chronic diastolic heart failure with EF 55-60% (2014) with multiple recent admissions for persistent dyspnea with hypoxia who presented with same complaints associated with general malaise, 20 lbs weight loss and poor oral intake.  12/13/17: Patient seen.  Patient is slowly improving.  Patient tells me that the p.o. intake is improving.  We will go ahead and start patient on Megace.  We will also start calorie count.  Will monitor renal function and electrolytes closely.    Assessment/Plan: Principal Problem:   Acute kidney injury superimposed on CKD (HCC) Active Problems:   Hypothyroidism   Persistent Paroxysmal atrial fibrillation: CHA2DS2-VASc Score 5, on Eliquis   Chronic diastolic heart failure (HCC)   Hypokalemia   COPD (chronic obstructive pulmonary disease) (HCC)   Hypocalcemia   Pressure injury of skin   Malnutrition of moderate degree  Acute kidney injury superimposed on CKD stage III  - SCr of 2.33, up from baseline of about 1.3  -Serum creatinine is 1.98 today. -We will continue IV fluids.  We will continue to monitor renal function and electrolytes. -Potassium today is 4.5. - Likely secondary to recent diuresis; she has lost 20 lbs over the past 1 week  - Treated in ED with 500 cc NS  -Continue to hold diuretics, repeat chem panel in am   Generalized weakness/fatigue/physical deconditioning - PT to evaluate - increase oral intake -Start Megace. -Caloric counts. - oral supplements  - fall precaution - dietitian consult  Moderate protein calorie malnutrition - management as stated above  Chronic diastolic CHF EF 58-52% (7782) - Hypovolemic on admission  - Gently hydrating -  I/O's and daily wts   -Monitor  pulmonary symptoms and cardiac symptoms closely.  COPD  - Stable on admission  - Continue scheduled ICS and DuoNeb, prn albuterol    PAF  - In a paced-rhythm on admission   - CHADS-VASc at least 5 (age x2, gender, CHF, HTN)  - Continue Eliquis    Anxiety; insomnia  - Stable  - Continue prn Ativan and Restoril   Hypothyroidism  - TSH normal earlier today  - Continue Synthroid   Hypokalemia; hypocalcemia  - Serum potassium is 3.2 and calcium 6.6 with albumin of 3.4  - Likely secondary to diuretics, currently on hold  - Treated with 40 mEq oral potassium, 2 g IV magnesium, and 1 g IV calcium  -Calcium level today 7.5. - Repeat chemistries in am    Hypertension  - BP at goal, continue Norvasc and hydralazine  - Coreg recently stopped d/t bradycardia      Code Status: DNR  Family Communication: none at bedside  Disposition Plan: Discharge to home when stable   Consultants:  none  Procedures:  none  Antimicrobials:  none  DVT prophylaxis:  none   Objective: Vitals:   12/14/17 0800 12/14/17 0905 12/14/17 1421 12/14/17 1459  BP: (!) 121/55  130/62   Pulse: (!) 59  62   Resp: 16  16   Temp: 98.4 F (36.9 C)  98.2 F (36.8 C)   TempSrc: Oral  Oral   SpO2: 98% 97% 98% 97%  Weight:      Height:        Intake/Output Summary (Last 24 hours) at 12/14/2017 1652 Last data filed at 12/14/2017 1422 Gross per 24 hour  Intake 1744.5 ml  Output -  Net 1744.5 ml   Filed Weights   12/12/17 1915 12/13/17 2024 12/14/17 0500  Weight: 56.2 kg (123 lb 12.8 oz) 60 kg (132 lb 4.4 oz) 61 kg (134 lb 7.7 oz)    Exam:   General: Cachectic.  Not in any distress.  She is awake and alert.     Cardiovascular: S1-S2.    Respiratory: CTA.    Abdomen: soft NT ND NBS x4  Musculoskeletal: non focal. No LE edema  Skin: no rash noted  Psychiatry: Mood appropriate for condition and setting.   Data Reviewed: CBC: Recent Labs  Lab 12/11/17 1430 12/12/17 0537  12/13/17 1612  WBC 10.7* 6.3 7.9  NEUTROABS  --  4.4  --   HGB 14.8 12.1 12.4  HCT 44.0 35.7* 38.1  MCV 95.7 97.5 99.0  PLT 206 163 160   Basic Metabolic Panel: Recent Labs  Lab 12/10/17 1403 12/11/17 1430 12/12/17 0537 12/13/17 1612 12/14/17 0536  NA 141 141 142 142 142  K 3.3* 3.2* 3.2* 4.5 4.4  CL 90* 93* 102 106 108  CO2 35* 32 28 24 25   GLUCOSE 122* 96 102* 138* 124*  BUN 65* 67* 58* 48* 49*  CREATININE 2.40* 2.33* 1.95* 1.98* 1.72*  CALCIUM 7.0* 6.6* 6.7* 7.5* 7.3*  MG  --   --  2.0  --   --   PHOS  --   --   --  2.5  2.4* 2.6   GFR: Estimated Creatinine Clearance: 18.3 mL/min (A) (by C-G formula based on SCr of 1.72 mg/dL (H)). Liver Function Tests: Recent Labs  Lab 12/11/17 1702 12/13/17 1612 12/14/17 0536  AST 31  --   --   ALT 24  --   --   ALKPHOS 86  --   --   BILITOT 0.8  --   --   PROT 6.2*  --   --   ALBUMIN 3.4* 3.2*  3.1* 2.5*   No results for input(s): LIPASE, AMYLASE in the last 168 hours. No results for input(s): AMMONIA in the last 168 hours. Coagulation Profile: No results for input(s): INR, PROTIME in the last 168 hours. Cardiac Enzymes: No results for input(s): CKTOTAL, CKMB, CKMBINDEX, TROPONINI in the last 168 hours. BNP (last 3 results) No results for input(s): PROBNP in the last 8760 hours. HbA1C: No results for input(s): HGBA1C in the last 72 hours. CBG: No results for input(s): GLUCAP in the last 168 hours. Lipid Profile: No results for input(s): CHOL, HDL, LDLCALC, TRIG, CHOLHDL, LDLDIRECT in the last 72 hours. Thyroid Function Tests: Recent Labs    12/11/17 1702  TSH 4.376   Anemia Panel: No results for input(s): VITAMINB12, FOLATE, FERRITIN, TIBC, IRON, RETICCTPCT in the last 72 hours. Urine analysis:    Component Value Date/Time   COLORURINE YELLOW 12/11/2017 2006   APPEARANCEUR CLEAR 12/11/2017 2006   LABSPEC 1.014 12/11/2017 2006   PHURINE 5.0 12/11/2017 2006   GLUCOSEU NEGATIVE 12/11/2017 2006   HGBUR  NEGATIVE 12/11/2017 2006   HGBUR negative 02/22/2008 0000   BILIRUBINUR NEGATIVE 12/11/2017 2006   BILIRUBINUR negative 06/28/2016 [place] 12/11/2017 2006   PROTEINUR NEGATIVE 12/11/2017 2006   UROBILINOGEN 0.2 06/28/2016 1657   UROBILINOGEN 0.2 12/03/2013 1942   NITRITE NEGATIVE 12/11/2017 2006   LEUKOCYTESUR NEGATIVE 12/11/2017 2006   Sepsis Labs: @LABRCNTIP (procalcitonin:4,lacticidven:4)  ) Recent Results (from the past 240 hour(s))  Urine culture     Status: None  Collection Time: 12/11/17  4:54 PM  Result Value Ref Range Status   Specimen Description URINE, RANDOM  Final   Special Requests NONE  Final   Culture   Final    NO GROWTH Performed at [facility], [place] [address] [place],  [address]    Report Status 12/13/2017 FINAL  Final      Studies: No results found.  Scheduled Meds: . amLODipine  5 mg Oral Daily  . apixaban  2.5 mg Oral BID  . budesonide (PULMICORT) nebulizer solution  0.25 mg Nebulization BID  . febuxostat  40 mg Oral Daily  . feeding supplement (ENSURE ENLIVE)  237 mL Oral BID BM  . hydrALAZINE  50 mg Oral BID  . ipratropium-albuterol  3 mL Nebulization TID  . levothyroxine  88 mcg Oral QAC breakfast  . megestrol  400 mg Oral BID  . multivitamin with minerals  1 tablet Oral Daily  . pantoprazole  40 mg Oral Daily  . cyanocobalamin  1,000 mcg Oral Daily    Continuous Infusions: . sodium chloride 50 mL/hr at 12/13/17 0617     LOS: 3 days     Bonnell Public, MD [facility] Pager [phone]  If 7PM-7AM, please contact night-coverage [email or web address] Password [email or web address] 12/13/2017, 11:52 AM

## 2017-12-14 NOTE — Progress Notes (Signed)
PROGRESS NOTE  [name] TML:[id number] DOB: 1924/07/12 DOA: 12/11/2017 PCP: [name], MD  HPI/Recap of past 24 hours: Patient is an 84 y.o.femalefrom homewithpast medicalhistory ofchronicatrial fibrillationon eliquis, childhood asthma, chronic diastolic heart failure with EF 55-60% (2014) with multiple recent admissions for persistent dyspnea with hypoxia who presented with same complaints associated with general malaise, 20 lbs weight loss and poor oral intake.  12/14/17: Patient seen.  Patient looks much better today.  P.o. intake is improving.  Patient's appetite seems much improved.  Continue Megace for line on the caloric count.  Renal function is improving.  Electrolytes have been monitored.  Physical therapy input is appreciated.    Assessment/Plan: Principal Problem:   Acute kidney injury superimposed on CKD (HCC) Active Problems:   Hypothyroidism   Persistent Paroxysmal atrial fibrillation: CHA2DS2-VASc Score 5, on Eliquis   Chronic diastolic heart failure (HCC)   Hypokalemia   COPD (chronic obstructive pulmonary disease) (HCC)   Hypocalcemia   Pressure injury of skin   Malnutrition of moderate degree  Acute kidney injury superimposed on CKD stage III  - SCr of 2.33, up from baseline of about 1.3  -Serum creatinine is 1. 72 today. -We will continue IV fluids.  We will continue to monitor renal function and electrolytes. -Potassium today is 4.4. - Calcium is 7 parenterally.  -Continue to hold diuretics.  Generalized weakness/fatigue/physical deconditioning - PT to evaluate - increase oral intake -Start Megace. -Caloric counts. - oral supplements  - fall precaution - dietitian consult  Moderate protein calorie malnutrition - management as stated above  Chronic diastolic CHF EF 68-12% (7517) - Hypovolemic on admission  - Gently hydrating -  I/O's and daily wts   -Monitor pulmonary symptoms and cardiac symptoms closely.  COPD  - Stable on  admission  - Continue scheduled ICS and DuoNeb, prn albuterol    PAF  - In a paced-rhythm on admission   - CHADS-VASc at least 5 (age x2, gender, CHF, HTN)  - Continue Eliquis    Anxiety; insomnia  - Stable  - Continue prn Ativan and Restoril   Hypothyroidism  - TSH normal earlier today  - Continue Synthroid   Hypokalemia; hypocalcemia  - Serum potassium is 3.2 and calcium 6.6 with albumin of 3.4  - Likely secondary to diuretics, currently on hold  - Treated with 40 mEq oral potassium, 2 g IV magnesium, and 1 g IV calcium  -Calcium level today 7.5. - Repeat chemistries in am    Hypertension  - BP at goal, continue Norvasc and hydralazine  - Coreg recently stopped d/t bradycardia      Code Status: DNR  Family Communication: none at bedside  Disposition Plan: Discharge to home when stable   Consultants:  none  Procedures:  none  Antimicrobials:  none  DVT prophylaxis:  none   Objective: Vitals:   12/14/17 0800 12/14/17 0905 12/14/17 1421 12/14/17 1459  BP: (!) 121/55  130/62   Pulse: (!) 59  62   Resp: 16  16   Temp: 98.4 F (36.9 C)  98.2 F (36.8 C)   TempSrc: Oral  Oral   SpO2: 98% 97% 98% 97%  Weight:      Height:        Intake/Output Summary (Last 24 hours) at 12/14/2017 1702 Last data filed at 12/14/2017 1422 Gross per 24 hour  Intake 1624.5 ml  Output -  Net 1624.5 ml   Filed Weights   12/12/17 1915 12/13/17 2024 12/14/17 0500  Weight: 56.2 kg (123 lb 12.8 oz) 60 kg (132 lb 4.4 oz) 61 kg (134 lb 7.7 oz)    Exam:   General: Cachectic.  Not in any distress.  She is awake and alert.     Cardiovascular: S1-S2.    Respiratory: CTA.    Abdomen: soft NT ND NBS x4  Musculoskeletal: non focal. No LE edema  Skin: no rash noted  Psychiatry: Mood appropriate for condition and setting.   Data Reviewed: CBC: Recent Labs  Lab 12/11/17 1430 12/12/17 0537 12/13/17 1612  WBC 10.7* 6.3 7.9  NEUTROABS  --  4.4  --   HGB 14.8  12.1 12.4  HCT 44.0 35.7* 38.1  MCV 95.7 97.5 99.0  PLT 206 163 951   Basic Metabolic Panel: Recent Labs  Lab 12/10/17 1403 12/11/17 1430 12/12/17 0537 12/13/17 1612 12/14/17 0536  NA 141 141 142 142 142  K 3.3* 3.2* 3.2* 4.5 4.4  CL 90* 93* 102 106 108  CO2 35* 32 28 24 25   GLUCOSE 122* 96 102* 138* 124*  BUN 65* 67* 58* 48* 49*  CREATININE 2.40* 2.33* 1.95* 1.98* 1.72*  CALCIUM 7.0* 6.6* 6.7* 7.5* 7.3*  MG  --   --  2.0  --   --   PHOS  --   --   --  2.5  2.4* 2.6   GFR: Estimated Creatinine Clearance: 18.3 mL/min (A) (by C-G formula based on SCr of 1.72 mg/dL (H)). Liver Function Tests: Recent Labs  Lab 12/11/17 1702 12/13/17 1612 12/14/17 0536  AST 31  --   --   ALT 24  --   --   ALKPHOS 86  --   --   BILITOT 0.8  --   --   PROT 6.2*  --   --   ALBUMIN 3.4* 3.2*  3.1* 2.5*   No results for input(s): LIPASE, AMYLASE in the last 168 hours. No results for input(s): AMMONIA in the last 168 hours. Coagulation Profile: No results for input(s): INR, PROTIME in the last 168 hours. Cardiac Enzymes: No results for input(s): CKTOTAL, CKMB, CKMBINDEX, TROPONINI in the last 168 hours. BNP (last 3 results) No results for input(s): PROBNP in the last 8760 hours. HbA1C: No results for input(s): HGBA1C in the last 72 hours. CBG: No results for input(s): GLUCAP in the last 168 hours. Lipid Profile: No results for input(s): CHOL, HDL, LDLCALC, TRIG, CHOLHDL, LDLDIRECT in the last 72 hours. Thyroid Function Tests: No results for input(s): TSH, T4TOTAL, FREET4, T3FREE, THYROIDAB in the last 72 hours. Anemia Panel: No results for input(s): VITAMINB12, FOLATE, FERRITIN, TIBC, IRON, RETICCTPCT in the last 72 hours. Urine analysis:    Component Value Date/Time   COLORURINE YELLOW 12/11/2017 2006   APPEARANCEUR CLEAR 12/11/2017 2006   LABSPEC 1.014 12/11/2017 2006   PHURINE 5.0 12/11/2017 2006   GLUCOSEU NEGATIVE 12/11/2017 2006   HGBUR NEGATIVE 12/11/2017 2006   HGBUR  negative 02/22/2008 0000   BILIRUBINUR NEGATIVE 12/11/2017 2006   BILIRUBINUR negative 06/28/2016 [place] 12/11/2017 2006   PROTEINUR NEGATIVE 12/11/2017 2006   UROBILINOGEN 0.2 06/28/2016 1657   UROBILINOGEN 0.2 12/03/2013 1942   NITRITE NEGATIVE 12/11/2017 2006   LEUKOCYTESUR NEGATIVE 12/11/2017 2006   Sepsis Labs: @LABRCNTIP (procalcitonin:4,lacticidven:4)  ) Recent Results (from the past 240 hour(s))  Urine culture     Status: None   Collection Time: 12/11/17  4:54 PM  Result Value Ref Range Status   Specimen Description URINE, RANDOM  Final  Special Requests NONE  Final   Culture   Final    NO GROWTH Performed at [facility], [place] [address] [place], [place] [address]    Report Status 12/13/2017 FINAL  Final      Studies: No results found.  Scheduled Meds: . amLODipine  5 mg Oral Daily  . apixaban  2.5 mg Oral BID  . budesonide (PULMICORT) nebulizer solution  0.25 mg Nebulization BID  . febuxostat  40 mg Oral Daily  . feeding supplement (ENSURE ENLIVE)  237 mL Oral BID BM  . hydrALAZINE  50 mg Oral BID  . ipratropium-albuterol  3 mL Nebulization TID  . levothyroxine  88 mcg Oral QAC breakfast  . megestrol  400 mg Oral BID  . multivitamin with minerals  1 tablet Oral Daily  . pantoprazole  40 mg Oral Daily  . cyanocobalamin  1,000 mcg Oral Daily    Continuous Infusions: . sodium chloride 50 mL/hr at 12/13/17 0617     LOS: 3 days     Bonnell Public, MD [facility] Pager [phone]  If 7PM-7AM, please contact night-coverage [email or web address] Password [facility] 12/13/2017, 11:52 AM

## 2017-12-15 DIAGNOSIS — R5381 Other malaise: Secondary | ICD-10-CM

## 2017-12-15 LAB — RENAL FUNCTION PANEL
Albumin: 2.4 g/dL — ABNORMAL LOW (ref 3.5–5.0)
Anion gap: 9 (ref 5–15)
BUN: 42 mg/dL — ABNORMAL HIGH (ref 6–20)
CO2: 24 mmol/L (ref 22–32)
Calcium: 7.9 mg/dL — ABNORMAL LOW (ref 8.9–10.3)
Chloride: 110 mmol/L (ref 101–111)
Creatinine, Ser: 1.57 mg/dL — ABNORMAL HIGH (ref 0.44–1.00)
GFR calc Af Amer: 32 mL/min — ABNORMAL LOW (ref >60)
GFR calc non Af Amer: 28 mL/min — ABNORMAL LOW (ref >60)
Glucose, Bld: 105 mg/dL — ABNORMAL HIGH (ref 65–99)
Phosphorus: 2.6 mg/dL (ref 2.5–4.6)
Potassium: 4.4 mmol/L (ref 3.5–5.1)
Sodium: 143 mmol/L (ref 135–145)

## 2017-12-15 MED ORDER — HYPROMELLOSE (GONIOSCOPIC) 2.5 % OP SOLN
1.0000 [drp] | Freq: Three times a day (TID) | OPHTHALMIC | Status: DC | PRN
  Filled 2017-12-15: qty 15

## 2017-12-15 MED ORDER — POLYVINYL ALCOHOL 1.4 % OP SOLN
1.0000 [drp] | Freq: Three times a day (TID) | OPHTHALMIC | Status: DC | PRN
  Filled 2017-12-15 (×2): qty 15

## 2017-12-15 NOTE — Care Management Important Message (Signed)
Important Message  Patient Details  Name: [name] MRN: [id number] Date of Birth: 10-Sep-1924   Medicare Important Message Given:  Yes    Chardai Gangemi P [place] 12/15/2017, 3:59 PM

## 2017-12-15 NOTE — Progress Notes (Signed)
PROGRESS NOTE  [name] NWG:[id number] DOB: [birth year] DOA: 12/11/2017 PCP: [name], MD  HPI/Recap of past 24 hours: Patient is an 84 y.o.femalefrom homewithpast medicalhistory ofparoxysmal atrial fibrillationon eliquis, childhood asthma, chronic diastolic heart failure with EF 55-60% (2014) with multiple recent admissions for persistent dyspnea with hypoxia who presented with same complaints associated with general malaise, 20 lbs weight loss and poor oral intake.  12/15/17: patient seen and examined at her baseline. Reports appetite is now slowly getting better however states she still feels too weak to go home. Denies chest pain or dyspnea. Nutritionist and PT following. We appreciate recommendations.    Assessment/Plan: Principal Problem:   Acute kidney injury superimposed on CKD (HCC) Active Problems:   Hypothyroidism   Persistent Paroxysmal atrial fibrillation: CHA2DS2-VASc Score 5, on Eliquis   Chronic diastolic heart failure (HCC)   Hypokalemia   COPD (chronic obstructive pulmonary disease) (HCC)   Hypocalcemia   Pressure injury of skin   Malnutrition of moderate degree  Acute kidney injury superimposed on CKD stage III, improving  - SCr of 1.57 from 2.33, up from baseline of about 1.3  -We will continue IV fluids.  We will continue to monitor renal function and electrolytes. -Potassium today is 4.4. -Calcium 7.9 from 7.3 -Continue to hold diuretics. -gentle IV fluid hydration at 50cc/hr  Generalized weakness/fatigue/physical deconditioning - continue PT  - PT recommends HHPT - continue to increase oral intake - continue Megace. Good response - Continue caloric counts. - continue oral supplements  - continue fall precaution - continue nutritionist follow up  Moderate protein calorie malnutrition - management as stated above  Chronic diastolic CHF EF 57-84% (6962) - Hypovolemic on admission  - Gently hydrating -  I/O's and daily wts     -Monitor pulmonary symptoms and cardiac symptoms closely.  COPD  - Stable on admission  - Continue scheduled ICS and DuoNeb, prn albuterol    PAF  - In a paced-rhythm on admission   - CHADS-VASc at least 5 (age x2, gender, CHF, HTN)  - Continue Eliquis   Anxiety; insomnia  - Stable  - Continue prn Ativan and Restoril   Hypothyroidism  - TSH normal earlier today  - Continue Synthroid   Hypokalemia; hypocalcemia, resolving  - Serum potassium is 4.4 from 3.2 and calcium 7.9 from 6.6 with albumin of 2.4  - Likely secondary to diuretics, currently on hold  - Treated with 40 mEq oral potassium, 2 g IV magnesium, and 1 g IV calcium   Hypertension  - BP at goal, continue Norvasc and hydralazine  - Coreg recently stopped d/t bradycardia   Chronic dry eyes  -unclear etiology -artificial tears ordered prn  Code Status: DNR  Family Communication: Son on the phone 12/15/17; all questions answered to his satisfaction.  Disposition Plan: Discharge to home when stable   Consultants:  PT  Nutritionist  Procedures:  none  Antimicrobials:  none  DVT prophylaxis:  eliquis for paroxysmal a-fib   Objective: Vitals:   12/14/17 2148 12/15/17 0454 12/15/17 0834 12/15/17 0836  BP: (!) 168/66 (!) 138/50    Pulse: 68 83    Resp: 16 16    Temp: 97.9 F (36.6 C) 97.6 F (36.4 C)    TempSrc: Oral Oral    SpO2: 98% 99% 98% 98%  Weight:  62.2 kg (137 lb 2 oz)    Height:        Intake/Output Summary (Last 24 hours) at 12/15/2017 9528 Last data filed at  12/15/2017 0521 Gross per 24 hour  Intake 1874.5 ml  Output -  Net 1874.5 ml   Filed Weights   12/13/17 2024 12/14/17 0500 12/15/17 0454  Weight: 60 kg (132 lb 4.4 oz) 61 kg (134 lb 7.7 oz) 62.2 kg (137 lb 2 oz)    Exam: 12/15/17 patient seen and examined.   General: Cachectic.  Not in any distress.  She is awake and alert x3     Cardiovascular: S1-S2. RRR no rubs or gallops  Respiratory: CTA.    Abdomen: soft  NT ND NBS x4  Musculoskeletal: non focal. No LE edema  Skin: no rash noted  Psychiatry: Mood appropriate for condition and setting.   Data Reviewed: CBC: Recent Labs  Lab 12/11/17 1430 12/12/17 0537 12/13/17 1612  WBC 10.7* 6.3 7.9  NEUTROABS  --  4.4  --   HGB 14.8 12.1 12.4  HCT 44.0 35.7* 38.1  MCV 95.7 97.5 99.0  PLT 206 163 676   Basic Metabolic Panel: Recent Labs  Lab 12/11/17 1430 12/12/17 0537 12/13/17 1612 12/14/17 0536 12/15/17 0629  NA 141 142 142 142 143  K 3.2* 3.2* 4.5 4.4 4.4  CL 93* 102 106 108 110  CO2 32 28 24 25 24   GLUCOSE 96 102* 138* 124* 105*  BUN 67* 58* 48* 49* 42*  CREATININE 2.33* 1.95* 1.98* 1.72* 1.57*  CALCIUM 6.6* 6.7* 7.5* 7.3* 7.9*  MG  --  2.0  --   --   --   PHOS  --   --  2.5  2.4* 2.6 2.6   GFR: Estimated Creatinine Clearance: 20.2 mL/min (A) (by C-G formula based on SCr of 1.57 mg/dL (H)). Liver Function Tests: Recent Labs  Lab 12/11/17 1702 12/13/17 1612 12/14/17 0536 12/15/17 0629  AST 31  --   --   --   ALT 24  --   --   --   ALKPHOS 86  --   --   --   BILITOT 0.8  --   --   --   PROT 6.2*  --   --   --   ALBUMIN 3.4* 3.2*  3.1* 2.5* 2.4*   No results for input(s): LIPASE, AMYLASE in the last 168 hours. No results for input(s): AMMONIA in the last 168 hours. Coagulation Profile: No results for input(s): INR, PROTIME in the last 168 hours. Cardiac Enzymes: No results for input(s): CKTOTAL, CKMB, CKMBINDEX, TROPONINI in the last 168 hours. BNP (last 3 results) No results for input(s): PROBNP in the last 8760 hours. HbA1C: No results for input(s): HGBA1C in the last 72 hours. CBG: No results for input(s): GLUCAP in the last 168 hours. Lipid Profile: No results for input(s): CHOL, HDL, LDLCALC, TRIG, CHOLHDL, LDLDIRECT in the last 72 hours. Thyroid Function Tests: No results for input(s): TSH, T4TOTAL, FREET4, T3FREE, THYROIDAB in the last 72 hours. Anemia Panel: No results for input(s): VITAMINB12,  FOLATE, FERRITIN, TIBC, IRON, RETICCTPCT in the last 72 hours. Urine analysis:    Component Value Date/Time   COLORURINE YELLOW 12/11/2017 2006   APPEARANCEUR CLEAR 12/11/2017 2006   LABSPEC 1.014 12/11/2017 2006   PHURINE 5.0 12/11/2017 2006   GLUCOSEU NEGATIVE 12/11/2017 2006   HGBUR NEGATIVE 12/11/2017 2006   HGBUR negative 02/22/2008 0000   BILIRUBINUR NEGATIVE 12/11/2017 2006   BILIRUBINUR negative 06/28/2016 [place] 12/11/2017 2006   PROTEINUR NEGATIVE 12/11/2017 2006   UROBILINOGEN 0.2 06/28/2016 1657   UROBILINOGEN 0.2 12/03/2013 1942   NITRITE  NEGATIVE 12/11/2017 2006   LEUKOCYTESUR NEGATIVE 12/11/2017 2006   Sepsis Labs: @LABRCNTIP (procalcitonin:4,lacticidven:4)  ) Recent Results (from the past 240 hour(s))  Urine culture     Status: None   Collection Time: 12/11/17  4:54 PM  Result Value Ref Range Status   Specimen Description URINE, RANDOM  Final   Special Requests NONE  Final   Culture   Final    NO GROWTH Performed at [facility], [address] [place], [place] [address]    Report Status 12/13/2017 FINAL  Final      Studies: No results found.  Scheduled Meds: . amLODipine  5 mg Oral Daily  . apixaban  2.5 mg Oral BID  . budesonide (PULMICORT) nebulizer solution  0.25 mg Nebulization BID  . febuxostat  40 mg Oral Daily  . feeding supplement (ENSURE ENLIVE)  237 mL Oral BID BM  . hydrALAZINE  50 mg Oral BID  . ipratropium-albuterol  3 mL Nebulization TID  . levothyroxine  88 mcg Oral QAC breakfast  . megestrol  400 mg Oral BID  . multivitamin with minerals  1 tablet Oral Daily  . pantoprazole  40 mg Oral Daily  . cyanocobalamin  1,000 mcg Oral Daily    Continuous Infusions: . sodium chloride 50 mL/hr at 12/14/17 2140     LOS: 4 days     Kayleen Memos, MD [facility] Pager [phone]  If 7PM-7AM, please contact night-coverage [email or web address] Password [facility] 12/13/2017, 11:52 AM

## 2017-12-15 NOTE — Progress Notes (Signed)
Calorie Count Note  72 hour calorie count ordered.  Diet: regular Supplements: MVI, Ensure Enlive po BID, each supplement provides 350 kcal and 20 grams of protein  Megace initiated on 12/13/17.  Appetite has improved since admission. Noted meal completion 25-100%. Pt also consuming Ensure supplements  12/13/17 Breakfast: 250 kcals, 3 grams protein Lunch: 220 kcals, 7 grams protein Dinner: 170 kcals, 8 grams protein Supplements: 2 Ensure Enlive supplements (700 kcals, 40 grams protein)  Total intake: 1340 kcal (89% of minimum estimated needs)  58 protein (89% of minimum estimated needs)  12/14/17 Breakfast: 224 kcals, 6 grams protein Lunch: 694 kcals, 7 grams protein Dinner: 137 kcals, 5 grams protein Supplements: 2 Ensure Enlive supplements (700 kcals, 40 grams protein)  Total intake: 1755 kcal (>100% of minimum estimated needs)  58 protein (89% of minimum estimated needs)  Total intake: 1548 kcal (100% of minimum estimated needs)  58 protein (89% of minimum estimated needs)  Nutrition Dx:  Moderate Malnutrition related to chronic illness(COPD, CHF) as evidenced by energy intake < 75% for > or equal to 1 month, mild fat depletion, moderate fat depletion, mild muscle depletion, moderate muscle depletion; ongoing  Goal:  Patient will meet greater than or equal to 90% of their needs; met  Intervention:  -D/c calorie count, as pt is meeting needs via meals and supplements -Continue Ensure Enlive po BID, each supplement provides 350 kcal and 20 grams of protein  Ketih Goodie A. Jimmye Norman, RD, LDN, CDE Pager: [phone] After hours Pager: [phone]

## 2017-12-15 NOTE — Progress Notes (Signed)
Physical Therapy Treatment Patient Details Name: [name] MRN: [id number] DOB: [birth year] Today's Date: 12/15/2017    History of Present Illness 84 y.o. female with medical history significant for asthma/COPD, chronic diastolic CHF, symptomatic bradycardia with pacer, and chronic kidney disease stage III-IV, now presenting to the emergency department for evaluation of lethargy and malaise with 20 pound weight loss over the past week.  She had 2 recent prior hospitalizations in Jan 2019.     PT Comments    Pt is mobilizing well and increased ambulation distance to 525 ft during today's session. Pt pushed IV pole for stability. She reports utilizing a cane or RW on occasion at baseline, however typically ambulates w/o AD. PT would benefit form HHPT at d/c to address balance deficits and increase pt's safety with mobility.    Follow Up Recommendations  Home health PT;Supervision - Intermittent     Equipment Recommendations  None recommended by PT    Recommendations for Other Services       Precautions / Restrictions Precautions Precautions: Fall Restrictions Weight Bearing Restrictions: No    Mobility  Bed Mobility Overal bed mobility: Modified Independent                Transfers Overall transfer level: Needs assistance Equipment used: None Transfers: Sit to/from Stand Sit to Stand: Supervision         General transfer comment: supervision for safety  Ambulation/Gait Ambulation/Gait assistance: Min guard Ambulation Distance (Feet): 525 Feet Assistive device: (pushing IV pole) Gait Pattern/deviations: Step-through pattern;Decreased stride length Gait velocity: decreased Gait velocity interpretation: Below normal speed for age/gender General Gait Details: Pt pushed IV pole for stability. No LOB noted. No physical assist needed.   Stairs            Wheelchair Mobility    Modified Rankin (Stroke Patients Only)       Balance Overall balance  assessment: Needs assistance Sitting-balance support: Feet supported;No upper extremity supported Sitting balance-Leahy Scale: Good     Standing balance support: During functional activity;No upper extremity supported Standing balance-Leahy Scale: Fair                              Cognition Arousal/Alertness: Awake/alert Behavior During Therapy: WFL for tasks assessed/performed Overall Cognitive Status: Within Functional Limits for tasks assessed                                 General Comments: Aware of limitations. Reports keeping her RW in her car and utalizing it when needing to ambulate long distances.      Exercises      General Comments        Pertinent Vitals/Pain Pain Assessment: No/denies pain    Home Living                      Prior Function            PT Goals (current goals can now be found in the care plan section) Acute Rehab PT Goals Patient Stated Goal: return home PT Goal Formulation: With patient Time For Goal Achievement: 12/27/17 Potential to Achieve Goals: Good Progress towards PT goals: Progressing toward goals    Frequency    Min 3X/week      PT Plan Current plan remains appropriate    Co-evaluation  AM-PAC PT "6 Clicks" Daily Activity  Outcome Measure  Difficulty turning over in bed (including adjusting bedclothes, sheets and blankets)?: None Difficulty moving from lying on back to sitting on the side of the bed? : None Difficulty sitting down on and standing up from a chair with arms (e.g., wheelchair, bedside commode, etc,.)?: A Little Help needed moving to and from a bed to chair (including a wheelchair)?: None Help needed walking in hospital room?: A Little Help needed climbing 3-5 steps with a railing? : A Little 6 Click Score: 21    End of Session Equipment Utilized During Treatment: Gait belt Activity Tolerance: Patient tolerated treatment well Patient left: with  call bell/phone within reach;in bed Nurse Communication: Mobility status PT Visit Diagnosis: Unsteadiness on feet (R26.81);Other abnormalities of gait and mobility (R26.89);Muscle weakness (generalized) (M62.81)     Time: 2440-1027 PT Time Calculation (min) (ACUTE ONLY): 17 min  Charges:  $Gait Training: 8-22 mins                    G Codes:       Benjiman Core, [place] Pager [phone] Acute Rehab  Allena Katz 12/15/2017, 2:52 PM

## 2017-12-16 LAB — RENAL FUNCTION PANEL
Albumin: 2.4 g/dL — ABNORMAL LOW (ref 3.5–5.0)
Anion gap: 11 (ref 5–15)
BUN: 41 mg/dL — ABNORMAL HIGH (ref 6–20)
CO2: 23 mmol/L (ref 22–32)
Calcium: 8.4 mg/dL — ABNORMAL LOW (ref 8.9–10.3)
Chloride: 108 mmol/L (ref 101–111)
Creatinine, Ser: 1.65 mg/dL — ABNORMAL HIGH (ref 0.44–1.00)
GFR calc Af Amer: 30 mL/min — ABNORMAL LOW (ref >60)
GFR calc non Af Amer: 26 mL/min — ABNORMAL LOW (ref >60)
Glucose, Bld: 110 mg/dL — ABNORMAL HIGH (ref 65–99)
Phosphorus: 3.3 mg/dL (ref 2.5–4.6)
Potassium: 4.6 mmol/L (ref 3.5–5.1)
Sodium: 142 mmol/L (ref 135–145)

## 2017-12-16 NOTE — Progress Notes (Signed)
PROGRESS NOTE  DAKIA SCHIFANO ZOX:[id number] DOB: Mar 21, 1924 DOA: 12/11/2017 PCP: [name], MD  HPI/Recap of past 24 hours: Patient is an 84 y.o.femalefrom homewithpast medicalhistory ofparoxysmal atrial fibrillationon eliquis, childhood asthma, chronic diastolic heart failure with EF 55-60% (2014) with multiple recent admissions for persistent dyspnea with hypoxia who presented with same complaints associated with general malaise, 20 lbs weight loss and poor oral intake.  2/5./19: patient seen.  Patient continues to improve.  Serum creatinine is 1.65 today.  Patient is not keen on being discharged back home today.  Hopefully, patient will be discharged in the next 24 hours.  Patient's appetite has improved.  No fever or chills, no shortness of breath, no chest pain.  Patient's renal function and electrolytes will need to be monitored on discharge.  Assessment/Plan: Principal Problem:   Acute kidney injury superimposed on CKD (HCC) Active Problems:   Hypothyroidism   Persistent Paroxysmal atrial fibrillation: CHA2DS2-VASc Score 5, on Eliquis   Chronic diastolic heart failure (HCC)   Hypokalemia   COPD (chronic obstructive pulmonary disease) (HCC)   Hypocalcemia   Pressure injury of skin   Malnutrition of moderate degree  Acute kidney injury superimposed on CKD stage III, improving  - SCr of 1.57 from 2.33, up from baseline of about 1.3  -We will continue IV fluids.  We will continue to monitor renal function and electrolytes. -Potassium today is 4.4. -Calcium 7.9 from 7.3 -Continue to hold diuretics. -gentle IV fluid hydration at 50cc/hr -Serum creatinine is 1.65 today, 12/16/2017. -Check renal function panel in the morning. -Hopefully, the patient will be discharged in the next 24 hours.  Generalized weakness/fatigue/physical deconditioning - continue PT  - PT recommends HHPT - continue to increase oral intake - continue Megace. Good response - Continue caloric  counts. - continue oral supplements  - continue fall precaution - continue nutritionist follow up  Moderate protein calorie malnutrition - management as stated above  Chronic diastolic CHF EF 81-19% (1478) - Hypovolemic on admission  - Gently hydrating -  I/O's and daily wts   -Monitor pulmonary symptoms and cardiac symptoms closely.  COPD  - Stable on admission  - Continue scheduled ICS and DuoNeb, prn albuterol    PAF  - In a paced-rhythm on admission   - CHADS-VASc at least 5 (age x2, gender, CHF, HTN)  - Continue Eliquis   Anxiety; insomnia  - Stable  - Continue prn Ativan and Restoril   Hypothyroidism  - TSH normal earlier today  - Continue Synthroid   Hypokalemia; hypocalcemia, resolving  - Serum potassium is 4.4 from 3.2 and calcium 7.9 from 6.6 with albumin of 2.4  - Likely secondary to diuretics, currently on hold  - Treated with 40 mEq oral potassium, 2 g IV magnesium, and 1 g IV calcium -Potassium is 4.6 today, 12/16/2017 and calcium is 8.4.  -Renal panel in a.m.  Hypertension  - BP at goal, continue Norvasc and hydralazine  - Coreg recently stopped d/t bradycardia   Chronic dry eyes  -unclear etiology -artificial tears ordered prn  Code Status: DNR  Family Communication:   Disposition Plan: Likely discharge home within the next 24 hours.     Consultants:  PT  Nutritionist  Procedures:  none  Antimicrobials:  none  DVT prophylaxis:  eliquis for paroxysmal a-fib   Objective: Vitals:   12/16/17 0535 12/16/17 0744 12/16/17 1334 12/16/17 1445  BP: (!) 146/65   (!) 152/60  Pulse: 69   78  Resp: 17  18  Temp: 98 F (36.7 C)   (!) 97.5 F (36.4 C)  TempSrc: Oral   Oral  SpO2: 99% 98% 98% 100%  Weight:      Height:        Intake/Output Summary (Last 24 hours) at 12/16/2017 1737 Last data filed at 12/16/2017 1500 Gross per 24 hour  Intake 1062 ml  Output -  Net 1062 ml   Filed Weights   12/14/17 0500 12/15/17 0454  12/16/17 0500  Weight: 61 kg (134 lb 7.7 oz) 62.2 kg (137 lb 2 oz) 64.4 kg (141 lb 15.6 oz)    Exam: 12/15/17 patient seen and examined.   General: Cachectic.  Not in any distress.  She is awake and alert x3     Cardiovascular: S1-S2. RRR no rubs or gallops  Respiratory: CTA.    Abdomen: soft NT ND NBS x4  Musculoskeletal: non focal. No LE edema  Skin: no rash noted  Psychiatry: Mood appropriate for condition and setting.   Data Reviewed: CBC: Recent Labs  Lab 12/11/17 1430 12/12/17 0537 12/13/17 1612  WBC 10.7* 6.3 7.9  NEUTROABS  --  4.4  --   HGB 14.8 12.1 12.4  HCT 44.0 35.7* 38.1  MCV 95.7 97.5 99.0  PLT 206 163 831   Basic Metabolic Panel: Recent Labs  Lab 12/12/17 0537 12/13/17 1612 12/14/17 0536 12/15/17 0629 12/16/17 0627  NA 142 142 142 143 142  K 3.2* 4.5 4.4 4.4 4.6  CL 102 106 108 110 108  CO2 28 24 25 24 23   GLUCOSE 102* 138* 124* 105* 110*  BUN 58* 48* 49* 42* 41*  CREATININE 1.95* 1.98* 1.72* 1.57* 1.65*  CALCIUM 6.7* 7.5* 7.3* 7.9* 8.4*  MG 2.0  --   --   --   --   PHOS  --  2.5  2.4* 2.6 2.6 3.3   GFR: Estimated Creatinine Clearance: 19.6 mL/min (A) (by C-G formula based on SCr of 1.65 mg/dL (H)). Liver Function Tests: Recent Labs  Lab 12/11/17 1702 12/13/17 1612 12/14/17 0536 12/15/17 0629 12/16/17 0627  AST 31  --   --   --   --   ALT 24  --   --   --   --   ALKPHOS 86  --   --   --   --   BILITOT 0.8  --   --   --   --   PROT 6.2*  --   --   --   --   ALBUMIN 3.4* 3.2*  3.1* 2.5* 2.4* 2.4*   No results for input(s): LIPASE, AMYLASE in the last 168 hours. No results for input(s): AMMONIA in the last 168 hours. Coagulation Profile: No results for input(s): INR, PROTIME in the last 168 hours. Cardiac Enzymes: No results for input(s): CKTOTAL, CKMB, CKMBINDEX, TROPONINI in the last 168 hours. BNP (last 3 results) No results for input(s): PROBNP in the last 8760 hours. HbA1C: No results for input(s): HGBA1C in the last  72 hours. CBG: No results for input(s): GLUCAP in the last 168 hours. Lipid Profile: No results for input(s): CHOL, HDL, LDLCALC, TRIG, CHOLHDL, LDLDIRECT in the last 72 hours. Thyroid Function Tests: No results for input(s): TSH, T4TOTAL, FREET4, T3FREE, THYROIDAB in the last 72 hours. Anemia Panel: No results for input(s): VITAMINB12, FOLATE, FERRITIN, TIBC, IRON, RETICCTPCT in the last 72 hours. Urine analysis:    Component Value Date/Time   COLORURINE YELLOW 12/11/2017 2006   APPEARANCEUR CLEAR 12/11/2017 2006  LABSPEC 1.014 12/11/2017 2006   PHURINE 5.0 12/11/2017 2006   GLUCOSEU NEGATIVE 12/11/2017 2006   HGBUR NEGATIVE 12/11/2017 2006   HGBUR negative 02/22/2008 0000   BILIRUBINUR NEGATIVE 12/11/2017 2006   BILIRUBINUR negative 06/28/2016 [place] 12/11/2017 2006   PROTEINUR NEGATIVE 12/11/2017 2006   UROBILINOGEN 0.2 06/28/2016 1657   UROBILINOGEN 0.2 12/03/2013 1942   NITRITE NEGATIVE 12/11/2017 2006   LEUKOCYTESUR NEGATIVE 12/11/2017 2006   Sepsis Labs: @LABRCNTIP (procalcitonin:4,lacticidven:4)  ) Recent Results (from the past 240 hour(s))  Urine culture     Status: None   Collection Time: 12/11/17  4:54 PM  Result Value Ref Range Status   Specimen Description URINE, RANDOM  Final   Special Requests NONE  Final   Culture   Final    NO GROWTH Performed at [facility], [employer] [address] [place], [place] [address]    Report Status 12/13/2017 FINAL  Final      Studies: No results found.  Scheduled Meds: . amLODipine  5 mg Oral Daily  . apixaban  2.5 mg Oral BID  . budesonide (PULMICORT) nebulizer solution  0.25 mg Nebulization BID  . febuxostat  40 mg Oral Daily  . feeding supplement (ENSURE ENLIVE)  237 mL Oral BID BM  . hydrALAZINE  50 mg Oral BID  . ipratropium-albuterol  3 mL Nebulization TID  . levothyroxine  88 mcg Oral QAC breakfast  . megestrol  400 mg Oral BID  . multivitamin with minerals  1 tablet Oral Daily  .  pantoprazole  40 mg Oral Daily  . cyanocobalamin  1,000 mcg Oral Daily    Continuous Infusions: . sodium chloride 50 mL/hr at 12/16/17 1400     LOS: 5 days     Bonnell Public, MD [facility] Pager [phone]  If 7PM-7AM, please contact night-coverage [email or web address] Password [facility] 12/13/2017, 11:52 AM

## 2017-12-16 NOTE — Telephone Encounter (Signed)
Noted  

## 2017-12-17 DIAGNOSIS — N179 Acute kidney failure, unspecified: Principal | ICD-10-CM

## 2017-12-17 DIAGNOSIS — N189 Chronic kidney disease, unspecified: Secondary | ICD-10-CM

## 2017-12-17 DIAGNOSIS — E440 Moderate protein-calorie malnutrition: Secondary | ICD-10-CM

## 2017-12-17 DIAGNOSIS — I481 Persistent atrial fibrillation: Secondary | ICD-10-CM

## 2017-12-17 DIAGNOSIS — J449 Chronic obstructive pulmonary disease, unspecified: Secondary | ICD-10-CM

## 2017-12-17 DIAGNOSIS — E038 Other specified hypothyroidism: Secondary | ICD-10-CM

## 2017-12-17 DIAGNOSIS — E876 Hypokalemia: Secondary | ICD-10-CM

## 2017-12-17 DIAGNOSIS — I5032 Chronic diastolic (congestive) heart failure: Secondary | ICD-10-CM

## 2017-12-17 LAB — PHOSPHORUS: Phosphorus: 3.5 mg/dL (ref 2.5–4.6)

## 2017-12-17 LAB — RENAL FUNCTION PANEL
Albumin: 2.4 g/dL — ABNORMAL LOW (ref 3.5–5.0)
Anion gap: 9 (ref 5–15)
BUN: 42 mg/dL — ABNORMAL HIGH (ref 6–20)
CO2: 22 mmol/L (ref 22–32)
Calcium: 8.3 mg/dL — ABNORMAL LOW (ref 8.9–10.3)
Chloride: 108 mmol/L (ref 101–111)
Creatinine, Ser: 1.77 mg/dL — ABNORMAL HIGH (ref 0.44–1.00)
GFR calc Af Amer: 28 mL/min — ABNORMAL LOW (ref >60)
GFR calc non Af Amer: 24 mL/min — ABNORMAL LOW (ref >60)
Glucose, Bld: 102 mg/dL — ABNORMAL HIGH (ref 65–99)
Phosphorus: 3.5 mg/dL (ref 2.5–4.6)
Potassium: 4.7 mmol/L (ref 3.5–5.1)
Sodium: 139 mmol/L (ref 135–145)

## 2017-12-17 LAB — MAGNESIUM: Magnesium: 1.5 mg/dL — ABNORMAL LOW (ref 1.7–2.4)

## 2017-12-17 MED ORDER — MEGESTROL ACETATE 400 MG/10ML PO SUSP: 400.0000 mg | Freq: Two times a day (BID) | ORAL | 0 refills | Status: DC

## 2017-12-17 MED ORDER — FUROSEMIDE 40 MG PO TABS: 40.0000 mg | ORAL_TABLET | Freq: Every day | ORAL | 3 refills | Status: DC | PRN

## 2017-12-17 MED ORDER — POTASSIUM CHLORIDE ER 20 MEQ PO TBCR: 20.0000 meq | EXTENDED_RELEASE_TABLET | Freq: Every day | ORAL | 0 refills | Status: DC | PRN

## 2017-12-17 MED ORDER — ENSURE ENLIVE PO LIQD: 237.0000 mL | Freq: Two times a day (BID) | ORAL | 12 refills | Status: DC

## 2017-12-17 MED ORDER — BISACODYL 5 MG PO TBEC: 5.0000 mg | DELAYED_RELEASE_TABLET | Freq: Every day | ORAL | 0 refills | Status: DC | PRN

## 2017-12-17 MED ORDER — ACETAMINOPHEN 325 MG PO TABS: 650.0000 mg | ORAL_TABLET | Freq: Four times a day (QID) | ORAL | Status: DC | PRN

## 2017-12-17 MED ORDER — MAGNESIUM SULFATE 2 GM/50ML IV SOLN
2.0000 g | Freq: Once | INTRAVENOUS | Status: AC
  Administered 2017-12-17: 2 g via INTRAVENOUS
  Filled 2017-12-17: qty 50

## 2017-12-17 MED ORDER — BENZONATATE 100 MG PO CAPS: 100.0000 mg | ORAL_CAPSULE | Freq: Three times a day (TID) | ORAL | 0 refills | Status: DC | PRN

## 2017-12-17 MED ORDER — SENNOSIDES-DOCUSATE SODIUM 8.6-50 MG PO TABS: 1.0000 | ORAL_TABLET | Freq: Every evening | ORAL | 0 refills | Status: DC | PRN

## 2017-12-17 NOTE — Progress Notes (Signed)
Pt discharged to home with home health.  Discharge instructions and prescriptions given

## 2017-12-17 NOTE — Care Management Note (Signed)
Case Management Note  Patient Details  Name: [name] MRN: [id number] Date of Birth: 1923/11/24  Subjective/Objective:                    Action/Plan:  Patient staying with son at present. Active with [facility] prior to admission, patient would like to continue services. Resumption of care orders entered. Text page MD. Neoma Laming with [facility] aware.   Expected Discharge Date:  12/17/17               Expected Discharge Plan:  [place]  In-House Referral:     Discharge planning Services  CM Consult  Post Acute Care Choice:  Home Health Choice offered to:  Patient  DME Arranged:  N/A DME Agency:  NA  HH Arranged:  RN, PT [place] Agency:  [place]  Status of Service:  Completed, signed off  If discussed at [place] of Stay Meetings, dates discussed:    Additional Comments:  Marilu Favre, RN 12/17/2017, 11:01 AM

## 2017-12-17 NOTE — Discharge Summary (Signed)
Physician Discharge Summary  YOUNIQUE CASAD AOZ:[id number] DOB: [birth year] DOA: 12/11/2017  PCP: [name], MD  Admit date: 12/11/2017 Discharge date: 12/17/2017  Admitted From: home Disposition:  home   Recommendations for Outpatient Follow-up:  1. Daily weights, f/u for fluid retention  Home Health:  ordered   Discharge Condition:  stable   CODE STATUS:  DNR   Consultations:  none    Discharge Diagnoses:  Principal Problem:   Acute kidney injury superimposed on CKD ([place]) Active Problems:   Hypothyroidism   Persistent Paroxysmal atrial fibrillation: CHA2DS2-VASc Score 5, on Eliquis   Chronic diastolic heart failure (HCC)   Hypokalemia   COPD (chronic obstructive pulmonary disease) (HCC)   Hypocalcemia   Pressure injury of skin   Malnutrition of moderate degree    Subjective: She has no complaints today. She is eating and drinking without trouble. She has no dyspnea or chest pain. She has been ambulating without recurrence of dyspnea.  Brief Summary: Patient is an 84 y.o.femalefrom homewithpast medicalhistory ofparoxysmal atrial fibrillationon eliquis, childhood asthma, chronic diastolic heart failure with EF 55-60% (2014) with multiple recent admissions for persistent dyspnea with hypoxia who presented with same complaints associated with general malaise, 20 lbs weight loss and poor oral intake. The cause for her dyspnea on the last admission was not clear. She had no evidence of CHF on CXR but was diuresed and treated for COPD flare and sent home on a higher dose of Lasix and with a Neb machine, antibiotics and a Prednisone taper.  She returned to the hospital severely dehydrated with AKI.   Hospital Course:  Acute kidney injury superimposed on CKD stage III, improving -Presented with BUN of 67 and Cr of 2.40  - has improved with continuous IV hydration -Continue to hold diuretics.  - Cr has improved to about 1.6-1.7 range    Generalized  weakness/fatigue/physical deconditioning - continue PT  - PT recommends HHPT  Moderate protein calorie malnutrition - management as stated above  Chronic diastolic CHFEF 96-29% (5284) -Hypovolemic on admission with dehydration- see ECHO report below- for now will need to continue to hold diuretics for now - [facility] to follow daily weights and assess for pedal edema and development of hypoxia  - I have discussed fluid restriction for her to prevent fluid gain - lasix is ordered PRN (with KCL) if she is gaining weight  COPD -Stable on admission   PAF- pacemaker -In a paced-rhythm on admission -CHADS-VASc at least 5 (age x2, gender, CHF, HTN) -Continue Eliquis  Anxiety; insomnia -Stable -Continue prn Ativan and Restoril  Hypothyroidism -TSH normal earlier today -Continue Synthroid  Hypokalemia; hypocalcemia, resolving - Likely secondary to diuretics, currently on hold - adequately replaced   Hypertension -Continue Norvasc and hydralazine -Coreg recently stopped (with cardiology input) d/t bradycardia- HR is in low 60s  Chronic dry eyes -artificial tears ordered prn   Discharge Exam:  Vitals:   12/16/17 2146 12/17/17 0458 12/17/17 0500 12/17/17 0922  BP: (!) 163/55 (!) 130/50    Pulse: 61 60    Resp:  15    Temp: 98.6 F (37 C) (!) 97.5 F (36.4 C)    TempSrc: Oral Axillary    SpO2: 98% 97%  98%  Weight:   67 kg (147 lb 11.3 oz)   Height:        General: Pt is alert, awake, not in acute distress Cardiovascular: RRR, S1/S2 +, no rubs, no gallops Respiratory: CTA bilaterally, no wheezing, no rhonchi Abdominal: Soft, NT,  ND, bowel sounds + Extremities: no edema, no cyanosis   Discharge Instructions  Discharge Instructions    Diet - low sodium heart healthy   Complete by:  As directed    Increase activity slowly   Complete by:  As directed      Allergies as of 12/17/2017      Reactions   Penicillins Swelling    REACTION: nausea, swelling   Xifaxan [rifaximin] Other (See Comments), Rash   unknown   Ambien [zolpidem Tartrate]    " made her crazy "   Amoxicillin    REACTION: unspecified   Ceftin [cefuroxime Axetil] Diarrhea   Codeine Phosphate    REACTION: unspecified   Colchicine    Severe diarrhea   Levofloxacin Other (See Comments)   Tingle numbness   Lipitor [atorvastatin] Other (See Comments)   "makes legs jump all night"      Medication List    TAKE these medications   acetaminophen 325 MG tablet Commonly known as:  TYLENOL Take 2 tablets (650 mg total) by mouth every 6 (six) hours as needed for mild pain (or Fever >/= 101). What changed:    medication strength  how much to take  reasons to take this   amLODipine 5 MG tablet Commonly known as:  NORVASC TAKE 1 TABLET (5 MG TOTAL) BY MOUTH DAILY.   benzonatate 100 MG capsule Commonly known as:  TESSALON Take 1 capsule (100 mg total) by mouth 3 (three) times daily as needed for cough. What changed:    when to take this  reasons to take this   bisacodyl 5 MG EC tablet Commonly known as:  DULCOLAX Take 1 tablet (5 mg total) by mouth daily as needed for moderate constipation.   [employer] VITAMIN B12 1000 MCG tablet Generic drug:  cyanocobalamin TAKE 1 TABLET BY MOUTH EVERY DAY What changed:    how much to take  how to take this  when to take this   ELIQUIS 2.5 MG Tabs tablet Generic drug:  apixaban TAKE 1 TABLET BY MOUTH TWICE A DAY What changed:    how much to take  how to take this  when to take this   feeding supplement (ENSURE ENLIVE) Liqd Take 237 mLs by mouth 2 (two) times daily between meals.   fluticasone 110 MCG/ACT inhaler Commonly known as:  FLOVENT HFA Inhale 2 puffs into the lungs 2 (two) times daily.   furosemide 40 MG tablet Commonly known as:  LASIX Take 1 tablet (40 mg total) by mouth daily as needed. Take 40mg  only if more than 2-3 pounds overnight and/or 5 pounds in a week What  changed:    how much to take  how to take this  when to take this  reasons to take this  additional instructions   hydrALAZINE 50 MG tablet Commonly known as:  APRESOLINE Take 1 tablet (50 mg total) by mouth 2 (two) times daily.   Hyoscyamine Sulfate SL 0.125 MG Subl Commonly known as:  LEVSIN/SL Take 1-2 tablets by mouth every 4 hours as needed What changed:    how much to take  how to take this  when to take this  reasons to take this  additional instructions   ipratropium-albuterol 0.5-2.5 (3) MG/3ML Soln Commonly known as:  DUONEB Take 3 mLs by nebulization 3 (three) times daily.   levothyroxine 88 MCG tablet Commonly known as:  SYNTHROID, LEVOTHROID TAKE 1 TABLET (88 MCG TOTAL) BY MOUTH DAILY BEFORE BREAKFAST.   LORazepam 0.5  MG tablet Commonly known as:  ATIVAN Take 1 tablet (0.5 mg total) by mouth every 8 (eight) hours as needed for anxiety.   megestrol 400 MG/10ML suspension Commonly known as:  MEGACE Take 10 mLs (400 mg total) by mouth 2 (two) times daily.   nitroGLYCERIN 0.4 MG SL tablet Commonly known as:  NITROSTAT Place 1 tablet (0.4 mg total) under the tongue every 5 (five) minutes x 3 doses as needed for chest pain.   omeprazole 20 MG capsule Commonly known as:  PRILOSEC Take 1 capsule (20 mg total) by mouth daily.   Potassium Chloride ER 20 MEQ Tbcr Take 20 mEq by mouth daily as needed. Take only if you are taking your Lasix that day.   senna-docusate 8.6-50 MG tablet Commonly known as:  Senokot-S Take 1 tablet by mouth at bedtime as needed for mild constipation.   temazepam 30 MG capsule Commonly known as:  RESTORIL TAKE ONE CAPSULE BY MOUTH AT BEDTIME *DRUG NOT COVERED* What changed:  See the new instructions.   ULORIC 40 MG tablet Generic drug:  febuxostat Take 40 mg by mouth daily.   UNABLE TO FIND Nebulizer machine for reactive airway disease. O03.559      Follow-up Information    [name], MD Follow up.    Specialty:  Family Medicine Why:  1 week Contact information: [address] [place] [address] [phone]          Allergies  Allergen Reactions  . Penicillins Swelling    REACTION: nausea, swelling  . Xifaxan [Rifaximin] Other (See Comments) and Rash    unknown  . Ambien [Zolpidem Tartrate]     " made her crazy "  . Amoxicillin     REACTION: unspecified  . Ceftin [Cefuroxime Axetil] Diarrhea  . Codeine Phosphate     REACTION: unspecified  . Colchicine     Severe diarrhea  . Levofloxacin Other (See Comments)    Tingle numbness  . Lipitor [Atorvastatin] Other (See Comments)    "makes legs jump all night"     Procedures/Studies: 2 D ECHO Study Conclusions  - Left ventricle: The cavity size was normal. Wall thickness was   increased in a pattern of mild LVH. Systolic function was normal.   Wall motion was normal; there were no regional wall motion   abnormalities. The study is not technically sufficient to allow   evaluation of LV diastolic function. - Aortic valve: Transvalvular velocity was within the normal range.   There was no stenosis. There was mild regurgitation. - Mitral valve: Transvalvular velocity was within the normal range.   There was no evidence for stenosis. There was mild regurgitation. - Left atrium: The atrium was severely dilated. - Right ventricle: The cavity size was normal. Wall thickness was   normal. Systolic function was normal. - Tricuspid valve: There was mild regurgitation. - Pulmonary arteries: Systolic pressure was within the normal   range. PA peak pressure: 30 mm Hg (S).  Dg Chest 2 View  Result Date: 12/11/2017 CLINICAL DATA:  Fatigue, recent pneumonia EXAM: CHEST  2 VIEW COMPARISON:  12/05/2017 chest radiograph. FINDINGS: Stable configuration of 2 lead left subclavian pacemaker. Stable cardiomediastinal silhouette with top-normal heart size and small hiatal hernia. No pneumothorax. No pleural effusion. Mildly  hyperinflated lungs. No pulmonary edema. No acute consolidative airspace disease. IMPRESSION: 1. Mildly hyperinflated lungs, which may indicate obstructive lung disease. 2. Lungs appear clear. 3. Stable small hiatal hernia. Electronically Signed   By: Ilona Sorrel  M.D.   On: 12/11/2017 17:43   Dg Chest 2 View  Result Date: 12/05/2017 CLINICAL DATA:  Shortness of breath for 3-4 weeks. EXAM: CHEST  2 VIEW COMPARISON:  Two-view chest x-ray 12/03/2017 FINDINGS: The heart is enlarged. Mild pulmonary vascular congestion is present. Pacing wires are stable. Atherosclerotic calcifications are present at the aortic arch. There is no edema or effusion. No focal airspace consolidation is present. Degenerative changes of the thoracic spine are stable. IMPRESSION: 1. Stable cardiomegaly. 2. Mild pulmonary vascular congestion without frank edema. Electronically Signed   By: San Morelle M.D.   On: 12/05/2017 08:45   X-ray Chest Pa And Lateral  Result Date: 12/03/2017 CLINICAL DATA:  Shortness of breath and cough. EXAM: CHEST  2 VIEW COMPARISON:  12/02/2017 and 11/15/2017 and 03/02/2017 FINDINGS: There is chronic cardiomegaly with calcification of the thoracic aorta. Pulmonary vascularity is normal. No infiltrates. Slight blunting of the posterior costophrenic angles probably represents tiny bilateral effusions. Pacemaker in place. No acute bone abnormality. Moderate hiatal hernia. IMPRESSION: 1. Chronic cardiomegaly. 2. Tiny bilateral pleural effusions. 3. Moderate hiatal hernia. Electronically Signed   By: [name] M.D.   On: 12/03/2017 15:31   Nm Pulmonary Perf And Vent  Result Date: 12/05/2017 CLINICAL DATA:  Shortness of breath today. EXAM: NUCLEAR MEDICINE VENTILATION - PERFUSION LUNG SCAN TECHNIQUE: Ventilation images were obtained in multiple projections using inhaled aerosol Tc-58m DTPA. Perfusion images were obtained in multiple projections after intravenous injection of Tc-29m MAA.  RADIOPHARMACEUTICALS:  31.8 mCi of Tc-47m DTPA aerosol inhalation and 4.2 mCi Tc60m MAA-IV COMPARISON:  PA and lateral chest 12/05/2017. FINDINGS: Ventilation: A nonsegmental wedge shaped defect is seen in the right lung just posterior to the apex. No other defect is identified. Perfusion: Defect in the superior aspect of the right lung just posterior to the apex matches the perfusion defect described above. IMPRESSION: Very low probability for pulmonary embolus. Electronically Signed   By: Inge Rise M.D.   On: 12/05/2017 19:56   Dg Chest Port 1 View  Result Date: 12/02/2017 CLINICAL DATA:  Cough and congestion. Chest pressure and shortness of breath. EXAM: PORTABLE CHEST 1 VIEW COMPARISON:  11/15/2017 FINDINGS: 1045 hours. Lungs are hyperexpanded. The lungs are clear without focal pneumonia, edema, pneumothorax or pleural effusion. The cardio pericardial silhouette is enlarged. Left permanent pacemaker remains in place. The visualized bony structures of the thorax are intact. Telemetry leads overlie the chest. IMPRESSION: Cardiomegaly without acute cardiopulmonary findings. Electronically Signed   By: Misty Stanley M.D.   On: 12/02/2017 10:58   Vas [place] Lower Extremity Venous (dvt)  Result Date: 12/06/2017  Lower Venous Study Indication: Edema. Examination Guidelines: A complete evaluation includes B-mode imaging, spectral doppler, color doppler, and power doppler as needed of all accessible portions of each vessel. Bilateral testing is considered an integral part of a complete examination. Limited examinations for reoccurring indications may be performed as noted. The reflux portion of the exam is performed with the patient in reverse Trendelenburg.  Final Interpretation: Right: There is no evidence of deep vein thrombosis in the lower extremity. No cystic structure found in the popliteal fossa. Left: There is no evidence of deep vein thrombosis in the lower extremity. No cystic structure found in the  popliteal fossa.  *See table(s) above for measurements and observations. Electronically signed by Ruta Hinds on 12/06/2017 at 12:08:49 PM.     The results of significant diagnostics from this hospitalization (including imaging, microbiology, ancillary and laboratory) are listed below for reference.  Microbiology: Recent Results (from the past 240 hour(s))  Urine culture     Status: None   Collection Time: 12/11/17  4:54 PM  Result Value Ref Range Status   Specimen Description URINE, RANDOM  Final   Special Requests NONE  Final   Culture   Final    NO GROWTH Performed at [facility], [address], [place], [place] [address]    Report Status 12/13/2017 FINAL  Final     Labs: BNP (last 3 results) Recent Labs    12/02/17 0930 12/05/17 0801 12/11/17 1702  BNP 783.1* 377.5* 440.1*   Basic Metabolic Panel: Recent Labs  Lab 12/12/17 0537  12/13/17 1612 12/14/17 0536 12/15/17 0629 12/16/17 0627 12/17/17 0557 12/17/17 0602  NA 142  --  142 142 143 142 139  --   K 3.2*  --  4.5 4.4 4.4 4.6 4.7  --   CL 102  --  106 108 110 108 108  --   CO2 28  --  24 25 24 23 22   --   GLUCOSE 102*  --  138* 124* 105* 110* 102*  --   BUN 58*  --  48* 49* 42* 41* 42*  --   CREATININE 1.95*  --  1.98* 1.72* 1.57* 1.65* 1.77*  --   CALCIUM 6.7*  --  7.5* 7.3* 7.9* 8.4* 8.3*  --   MG 2.0  --   --   --   --   --   --  1.5*  PHOS  --    < > 2.5  2.4* 2.6 2.6 3.3 3.5 3.5   < > = values in this interval not displayed.   Liver Function Tests: Recent Labs  Lab 12/11/17 1702 12/13/17 1612 12/14/17 0536 12/15/17 0629 12/16/17 0627 12/17/17 0557  AST 31  --   --   --   --   --   ALT 24  --   --   --   --   --   ALKPHOS 86  --   --   --   --   --   BILITOT 0.8  --   --   --   --   --   PROT 6.2*  --   --   --   --   --   ALBUMIN 3.4* 3.2*  3.1* 2.5* 2.4* 2.4* 2.4*   No results for input(s): LIPASE, AMYLASE in the last 168 hours. No results for input(s): AMMONIA in the last  168 hours. CBC: Recent Labs  Lab 12/11/17 1430 12/12/17 0537 12/13/17 1612  WBC 10.7* 6.3 7.9  NEUTROABS  --  4.4  --   HGB 14.8 12.1 12.4  HCT 44.0 35.7* 38.1  MCV 95.7 97.5 99.0  PLT 206 163 178   Cardiac Enzymes: No results for input(s): CKTOTAL, CKMB, CKMBINDEX, TROPONINI in the last 168 hours. BNP: Invalid input(s): POCBNP CBG: No results for input(s): GLUCAP in the last 168 hours. D-Dimer No results for input(s): DDIMER in the last 72 hours. Hgb A1c No results for input(s): HGBA1C in the last 72 hours. Lipid Profile No results for input(s): CHOL, HDL, LDLCALC, TRIG, CHOLHDL, LDLDIRECT in the last 72 hours. Thyroid function studies No results for input(s): TSH, T4TOTAL, T3FREE, THYROIDAB in the last 72 hours.  Invalid input(s): FREET3 Anemia work up No results for input(s): VITAMINB12, FOLATE, FERRITIN, TIBC, IRON, RETICCTPCT in the last 72 hours. Urinalysis    Component Value Date/Time   COLORURINE  YELLOW 12/11/2017 2006   APPEARANCEUR CLEAR 12/11/2017 2006   LABSPEC 1.014 12/11/2017 2006   PHURINE 5.0 12/11/2017 2006   GLUCOSEU NEGATIVE 12/11/2017 2006   HGBUR NEGATIVE 12/11/2017 2006   HGBUR negative 02/22/2008 0000   BILIRUBINUR NEGATIVE 12/11/2017 2006   BILIRUBINUR negative 06/28/2016 [place] 12/11/2017 2006   PROTEINUR NEGATIVE 12/11/2017 2006   UROBILINOGEN 0.2 06/28/2016 1657   UROBILINOGEN 0.2 12/03/2013 1942   NITRITE NEGATIVE 12/11/2017 2006   LEUKOCYTESUR NEGATIVE 12/11/2017 2006   Sepsis Labs Invalid input(s): PROCALCITONIN,  WBC,  LACTICIDVEN Microbiology Recent Results (from the past 240 hour(s))  Urine culture     Status: None   Collection Time: 12/11/17  4:54 PM  Result Value Ref Range Status   Specimen Description URINE, RANDOM  Final   Special Requests NONE  Final   Culture   Final    NO GROWTH Performed at [facility], [place] [address] [place],  [address]    Report Status 12/13/2017 FINAL  Final      Time coordinating discharge: Over 30 minutes  SIGNED:   Debbe Odea, MD  [facility] 12/17/2017, 9:30 AM Pager   If 7PM-7AM, please contact night-coverage [email or web address] Password [email or web address]

## 2017-12-17 NOTE — Progress Notes (Signed)
Physical Therapy Treatment Patient Details Name: [name] MRN: [id number] DOB: 1924-06-04 Today's Date: 12/17/2017    History of Present Illness 84 y.o. female with medical history significant for asthma/COPD, chronic diastolic CHF, symptomatic bradycardia with pacer, and chronic kidney disease stage III-IV, now presenting to the emergency department for evaluation of lethargy and malaise with 20 pound weight loss over the past week.  She had 2 recent prior hospitalizations in Jan 2019.     PT Comments    Pt progressing towards physical therapy goals. Was able to perform transfers and ambulation with min guard assist to supervision for safety and no AD. During ambulation, O2 was 99% on RA and HR maintained ~98 bpm. Tolerance for functional activity remains low and pt reports some SOB at end of gait training. Will continue to follow and progress as able per POC.   Follow Up Recommendations  Home health PT;Supervision - Intermittent     Equipment Recommendations  None recommended by PT    Recommendations for Other Services       Precautions / Restrictions Precautions Precautions: Fall Restrictions Weight Bearing Restrictions: No    Mobility  Bed Mobility Overal bed mobility:              General bed mobility comments: Pt received sitting up on EOB applying make up and fixing hair  Transfers Overall transfer level: Needs assistance Equipment used: None Transfers: Sit to/from Stand Sit to Stand: Supervision         General transfer comment: supervision for safety  Ambulation/Gait Ambulation/Gait assistance: Min guard Ambulation Distance (Feet): 400 Feet Assistive device: None Gait Pattern/deviations: Step-through pattern;Decreased stride length Gait velocity: decreased Gait velocity interpretation: Below normal speed for age/gender General Gait Details: Slow with flexed trunk. No AD used so pt could simulate home environment.    Stairs             Wheelchair Mobility    Modified Rankin (Stroke Patients Only)       Balance Overall balance assessment: Needs assistance Sitting-balance support: Feet supported;No upper extremity supported Sitting balance-Leahy Scale: Good     Standing balance support: During functional activity;No upper extremity supported Standing balance-Leahy Scale: Fair                              Cognition Arousal/Alertness: Awake/alert Behavior During Therapy: WFL for tasks assessed/performed Overall Cognitive Status: Within Functional Limits for tasks assessed                                 General Comments: Aware of limitations. Reports keeping her RW in her car and utalizing it when needing to ambulate long distances.      Exercises      General Comments        Pertinent Vitals/Pain Pain Assessment: No/denies pain    Home Living                      Prior Function            PT Goals (current goals can now be found in the care plan section) Acute Rehab PT Goals Patient Stated Goal: return home PT Goal Formulation: With patient Time For Goal Achievement: 12/27/17 Potential to Achieve Goals: Good Progress towards PT goals: Progressing toward goals    Frequency    Min 3X/week  PT Plan Current plan remains appropriate    Co-evaluation              AM-PAC PT "6 Clicks" Daily Activity  Outcome Measure  Difficulty turning over in bed (including adjusting bedclothes, sheets and blankets)?: None Difficulty moving from lying on back to sitting on the side of the bed? : None Difficulty sitting down on and standing up from a chair with arms (e.g., wheelchair, bedside commode, etc,.)?: A Little Help needed moving to and from a bed to chair (including a wheelchair)?: None Help needed walking in hospital room?: A Little Help needed climbing 3-5 steps with a railing? : A Little 6 Click Score: 21    End of Session Equipment Utilized  During Treatment: Gait belt Activity Tolerance: Patient tolerated treatment well Patient left: with call bell/phone within reach;in bed Nurse Communication: Mobility status PT Visit Diagnosis: Unsteadiness on feet (R26.81);Other abnormalities of gait and mobility (R26.89);Muscle weakness (generalized) (M62.81)     Time: 9622-2979 PT Time Calculation (min) (ACUTE ONLY): 20 min  Charges:  $Gait Training: 8-22 mins                    G Codes:       Rolinda Roan, PT, DPT Acute Rehabilitation Services Pager: [place] 12/17/2017, 1:16 PM

## 2017-12-18 ENCOUNTER — Telehealth: Payer: Self-pay | Admitting: Family Medicine

## 2017-12-18 NOTE — Telephone Encounter (Signed)
Sent to PCP send response back to [facility] for ER follow up   Thanks

## 2017-12-18 NOTE — Telephone Encounter (Signed)
I spoke with Butch Penny and went over below message, did a TCM also.

## 2017-12-18 NOTE — Telephone Encounter (Signed)
Tell her that she can stop the Megace. The inhaler I gave her is Flovent which she is to use BID everyday. The nebulizer is to use only when she feels SOB, and that is in addition to the Flovent. She can use both if needed

## 2017-12-18 NOTE — Telephone Encounter (Signed)
Transition Care Management Follow-up Telephone Call  Physician Discharge Summary  [name] ZJI:[id number] DOB: 1924-09-23 DOA: 12/11/2017  PCP: [name], MD  Admit date: 12/11/2017 Discharge date: 12/17/2017  Admitted From: home Disposition:  home   Recommendations for Outpatient Follow-up:  1. Daily weights, f/u for fluid retention  Home Health:  ordered   Discharge Condition:  stable   CODE STATUS:  DNR   Consultations:  none    Discharge Diagnoses:  Principal Problem:   Acute kidney injury superimposed on CKD ([place]) Active Problems:   Hypothyroidism   Persistent Paroxysmal atrial fibrillation: CHA2DS2-VASc Score 5, on Eliquis   Chronic diastolic heart failure (HCC)   Hypokalemia   COPD (chronic obstructive pulmonary disease) (HCC)   Hypocalcemia   Pressure injury of skin   Malnutrition of moderate degree    How have you been since you were released from the hospital? "feeling better"   Do you understand why you were in the hospital? yes   Do you understand the discharge instructions? yes   Where were you discharged to? Home with son and daughter in law   Items Reviewed:  Medications reviewed: yes  Allergies reviewed: yes  Dietary changes reviewed: yes  Referrals reviewed: yes   Functional Questionnaire:   Activities of Daily Living (ADLs):   She states they are independent in the following: ambulation, bathing and hygiene, feeding, continence, grooming, toileting and dressing States they require assistance with the following: none   Any transportation issues/concerns?: no   Any patient concerns? no   Confirmed importance and date/time of follow-up visits scheduled yes  Provider Appointment booked with Dr. Sarajane Jews on 12/23/2017 Tuesday at 3:15 pm  Confirmed with patient if condition begins to worsen call PCP or go to the ER.  Patient was given the office number and encouraged to call back with question or concerns.  : yes

## 2017-12-18 NOTE — Telephone Encounter (Signed)
Copied from [place]. Topic: Quick Communication - See Telephone Encounter >> Dec 18, 2017  9:37 AM Bea Graff, NT wrote: CRM for notification. See Telephone encounter for: Pts daughter calling and would like a cal regarding some things that the hospital sent home with her mom. First was an appetite enhancer but they are unsure if she should take it because she has gained weight over the last little bit. Medication is called megace. Also, Dr. Sarajane Jews prescribed and inhaler when the pt was at the office last time and then the hospital stated for pt to take 3 breathing treatments a day as well as the inhaler. Would like to know if she needs to be taking both so frequently. Please contact daughter.  12/18/17.

## 2017-12-19 ENCOUNTER — Other Ambulatory Visit: Payer: Self-pay | Admitting: Family Medicine

## 2017-12-22 NOTE — Telephone Encounter (Signed)
Last OV  12/10/2017  Last refilled 06/20/2017 disp 30 with 5 refills   Sent to PCP for approval

## 2017-12-22 NOTE — Telephone Encounter (Signed)
Call in #30 with 5 rf 

## 2017-12-23 ENCOUNTER — Other Ambulatory Visit: Payer: Self-pay

## 2017-12-23 ENCOUNTER — Ambulatory Visit: Payer: Medicare Other | Admitting: Family Medicine

## 2017-12-23 ENCOUNTER — Encounter: Payer: Self-pay | Admitting: Family Medicine

## 2017-12-23 VITALS — BP 140/72 | HR 66 | Temp 97.9°F | Wt 139.4 lb

## 2017-12-23 DIAGNOSIS — J439 Emphysema, unspecified: Secondary | ICD-10-CM

## 2017-12-23 DIAGNOSIS — I5032 Chronic diastolic (congestive) heart failure: Secondary | ICD-10-CM

## 2017-12-23 DIAGNOSIS — H6122 Impacted cerumen, left ear: Secondary | ICD-10-CM

## 2017-12-23 DIAGNOSIS — I1310 Hypertensive heart and chronic kidney disease without heart failure, with stage 1 through stage 4 chronic kidney disease, or unspecified chronic kidney disease: Secondary | ICD-10-CM

## 2017-12-23 DIAGNOSIS — R600 Localized edema: Secondary | ICD-10-CM

## 2017-12-23 DIAGNOSIS — I481 Persistent atrial fibrillation: Secondary | ICD-10-CM

## 2017-12-23 DIAGNOSIS — I10 Essential (primary) hypertension: Secondary | ICD-10-CM | POA: Diagnosis not present

## 2017-12-23 DIAGNOSIS — N184 Chronic kidney disease, stage 4 (severe): Secondary | ICD-10-CM

## 2017-12-23 DIAGNOSIS — I4819 Other persistent atrial fibrillation: Secondary | ICD-10-CM

## 2017-12-23 MED ORDER — TEMAZEPAM 15 MG PO CAPS: 30.0000 mg | ORAL_CAPSULE | Freq: Every evening | ORAL | 5 refills | Status: DC | PRN

## 2017-12-23 MED ORDER — FUROSEMIDE 40 MG PO TABS: 20.0000 mg | ORAL_TABLET | Freq: Every day | ORAL | 3 refills | Status: DC | PRN

## 2017-12-23 NOTE — Telephone Encounter (Signed)
Temazepam 30 MG is on back order per Dr. Sarajane Jews he gave me the OK to call in the 15 mg take 2 cap once daily disp 60 with 5 refills.

## 2017-12-23 NOTE — Progress Notes (Signed)
   Subjective:    Patient ID: [name], female    DOB: [birth year], 84 y.o.   MRN: [id number]  HPI Here with her daughter-in-law to follow up a hospital stay from 12-11-17 to 12-17-17 for weakness and SOB. On admission he CXR showed hyperinflation but no pulmonary edema. She ended up being dehydrated with an acute exacerbation of her CKD. She was rehydrated and Lasix was helpd for a few days. Her potassium remained stable and on DC was 4.7. Her creatinine came back done to her baseline and was 1.77 on DC. BP remained stable. Since coming back home her appetite has improved and she has more strength. She is taking 2 bottles of Ensure in addition to small meals. Her weight has increased about 5 lbs since her DC. She was to take a single 40 mg Lasix of her weight goes up by more than 2 lbs in 24 hours, and she has taken 2 of these doses. She has some stable ankle swelling that is not painful. Finally she complains of some mild pain in the left ear and decreased hearing.   Review of Systems  Constitutional: Negative.   HENT: Positive for ear pain and hearing loss.   Respiratory: Negative.   Cardiovascular: Positive for leg swelling. Negative for chest pain and palpitations.  Neurological: Negative.        Objective:   Physical Exam  Constitutional: She is oriented to person, place, and time. She appears well-developed and well-nourished.  HENT:  Left ear canal is full of cerumen  Neck: No thyromegaly present.  Cardiovascular: Normal rate, normal heart sounds and intact distal pulses.  Irregular rhythm   Pulmonary/Chest: Effort normal and breath sounds normal. No respiratory distress. She has no wheezes. She has no rales.  Musculoskeletal:  2+ edema in both lower legs   Lymphadenopathy:    She has no cervical adenopathy.  Neurological: She is alert and oriented to person, place, and time.          Assessment & Plan:  She has recovered from a recent acute kidney injury on top of CKD.  This is along with diastolic CHF, atrial fibrillation, and COPD. We will check another BMET today to follow the potassium and creatinine. She can still use a Lasix as needed for leg swelling or weight gain, but we will decrease the dose to 20 mg (or 1/2 a tablet). The cerumen impaction is irrigated clear with water. [name], MD

## 2017-12-23 NOTE — Telephone Encounter (Signed)
30 MG is on back order per Dr. Sarajane Jews he gave me the OK to call in the 15 mg take 2 cap once daily disp 60 with 5 refills.

## 2017-12-24 LAB — BASIC METABOLIC PANEL
BUN: 43 mg/dL — ABNORMAL HIGH (ref 6–23)
CO2: 26 meq/L (ref 19–32)
Calcium: 9.4 mg/dL (ref 8.4–10.5)
Chloride: 99 mEq/L (ref 96–112)
Creatinine, Ser: 1.96 mg/dL — ABNORMAL HIGH (ref 0.40–1.20)
GFR: 25.39 mL/min — AB (ref >60.00)
GLUCOSE: 86 mg/dL (ref 70–99)
POTASSIUM: 4.9 meq/L (ref 3.5–5.1)
SODIUM: 139 meq/L (ref 135–145)

## 2017-12-26 ENCOUNTER — Ambulatory Visit: Payer: Self-pay | Admitting: *Deleted

## 2017-12-26 ENCOUNTER — Encounter: Payer: Self-pay | Admitting: Family Medicine

## 2017-12-26 NOTE — Telephone Encounter (Signed)
[facility] at home nurse, reports BP is elevated 160/84. Requesting call back. No SOB or dizzy, took BP meds around 11am      Nurse is calling to report patient had elevated BP today. She had taken her BP medication. Patient has no symptoms except-anxiety. She is currently staying with her son and daughter-in-law.Butch Penny [phone]) Patient has had elevated blood pressures for the last few days per home nurse. Patient is keeping record. Per protocol patient needs appointment for assessment of BP- nurse has already left the home. Call forwarded to office for follow up. Reason for Disposition . [5] Systolic BP  >= 366 OR Diastolic >= 90 AND [4] taking BP medications  Answer Assessment - Initial Assessment Questions 1. BLOOD PRESSURE: "What is the blood pressure?" "Did you take at least two measurements 5 minutes apart?"     160/84 R   150/82 L 2. ONSET: "When did you take your blood pressure?"     1:30 pm 3. HOW: "How did you obtain the blood pressure?" (e.g., visiting nurse, automatic home BP monitor)     manual 4. HISTORY: "Do you have a history of high blood pressure?"     Patient has history of high BP 5. MEDICATIONS: "Are you taking any medications for blood pressure?" "Have you missed any doses recently?"     Patient is on medication- did take them 6. OTHER SYMPTOMS: "Do you have any symptoms?" (e.g., headache, chest pain, blurred vision, difficulty breathing, weakness)     No other symptoms- she has decreased swelling- she does have anxiety  7. PREGNANCY: "Is there any chance you are pregnant?" "When was your last menstrual period?"     n/a  Protocols used: HIGH BLOOD PRESSURE-A-AH

## 2017-12-29 NOTE — Telephone Encounter (Signed)
Sir please note

## 2017-12-30 NOTE — Telephone Encounter (Signed)
Let them just monitor this for a few more days and report back to [place]

## 2017-12-30 NOTE — Telephone Encounter (Signed)
Called and spoke to [place] @ [phone]. Butch Penny stated that ralene seems to get more stressed out when the home health nurse comes over otherwise her BP looks fine but Butch Penny was advised to have them keep an eye on her BP for a few more days and f/u with [place] if still an issues.

## 2017-12-30 NOTE — Telephone Encounter (Signed)
Called Butch Penny and left a VM to call back

## 2018-01-01 ENCOUNTER — Telehealth: Payer: Self-pay | Admitting: Cardiology

## 2018-01-01 NOTE — Telephone Encounter (Signed)
Returned the call to the patient's daughter in law, per dpr. The patient currently lives with her now. She stated that the patient has been having increased blood pressure readings lately ranging in the 170/70's with heart rates from the 70-90's. She did state that the patient takes her blood pressure multiple times in a row (sometimes 6 times) throughout the day and as the number increases, she gets anxious. The patient is not sympotmatic, denies chest pain and shortness of breath.  The daughter in law feels like the blood pressures may be increasing due to her being anxious over the readings. The patient has an appointment on 01/15/18 with Dr. Ellyn Hack. The patient feels like she should be seen sooner but the daughter in law wants her to be able to see Dr. Ellyn Hack. The daughter in law will start checking the patient's blood pressure herself at different times of the day. She will keep a log of the pressures. Message will be routed to the provider for further recommendation.

## 2018-01-01 NOTE — Telephone Encounter (Signed)
New Message     Pt c/o BP issue: STAT if pt c/o blurred vision, one-sided weakness or slurred speech  1. What are your last 5 BP readings?  171/71 Sunday  173/75 monday 176/74 tuesday heart rate 79-80  The hospital stopped her carvedilol   2. Are you having any other symptoms (ex. Dizziness, headache, blurred vision, passed out)? Having some shortness of breath with exertion , no other symptoms her weight dropped to 133 from 144, she is having some swelling as well Dr Sarajane Jews does not want her to take fluid pill   3. What is your BP issue? Daughter would like to speak to you about her mothers condition this has been off and on since Jan 22, 19

## 2018-01-02 NOTE — Telephone Encounter (Signed)
Returned the call to the patient's daughter. She will monitor her mother's blood pressure and increase the Hydralazine if the blood pressure consistently stays in the 170 range.

## 2018-01-02 NOTE — Telephone Encounter (Signed)
If her blood pressure levels are consistently in the 170 mmHg range throughout the day, and she is not having lightheadedness or dizziness then we can increase her hydralazine to 1-1/2 tablet twice a day.  I can see what her blood pressure looks like when I see her next month. If there is an elevated blood pressure, she needs to just sit down take deep breaths relax and then not recheck again for another hour or so.  Glenetta Hew, MD

## 2018-01-03 ENCOUNTER — Other Ambulatory Visit: Payer: Self-pay | Admitting: Cardiology

## 2018-01-03 ENCOUNTER — Other Ambulatory Visit: Payer: Self-pay | Admitting: Family Medicine

## 2018-01-05 NOTE — Telephone Encounter (Signed)
Call in #[phone] with 5 rf 

## 2018-01-05 NOTE — Telephone Encounter (Signed)
Last OV 12/23/2017  Last refilled 12/10/2017 disp 30 with 1 refill

## 2018-01-05 NOTE — Telephone Encounter (Signed)
Sent to PCP for approval.  

## 2018-01-07 ENCOUNTER — Telehealth: Payer: Self-pay | Admitting: Family Medicine

## 2018-01-07 NOTE — Telephone Encounter (Signed)
Noted  

## 2018-01-07 NOTE — Telephone Encounter (Signed)
Copied from [place]. Topic: General - Other >> Jan 07, 2018  3:46 PM Darl Householder, RMA wrote: Reason for CRM: Don[facility] from [facility] is calling to notify Dr. Sarajane Jews pt refuses services she feel she is doing better and no longer needs services, please return call to [facility] at [phone]

## 2018-01-07 NOTE — Telephone Encounter (Signed)
FYI

## 2018-01-15 ENCOUNTER — Other Ambulatory Visit: Payer: Self-pay | Admitting: Cardiology

## 2018-01-15 ENCOUNTER — Ambulatory Visit: Payer: Medicare Other | Admitting: Cardiology

## 2018-01-15 ENCOUNTER — Encounter: Payer: Self-pay | Admitting: Cardiology

## 2018-01-15 DIAGNOSIS — I110 Hypertensive heart disease with heart failure: Secondary | ICD-10-CM | POA: Diagnosis not present

## 2018-01-15 DIAGNOSIS — I481 Persistent atrial fibrillation: Secondary | ICD-10-CM | POA: Diagnosis not present

## 2018-01-15 DIAGNOSIS — I10 Essential (primary) hypertension: Secondary | ICD-10-CM | POA: Diagnosis not present

## 2018-01-15 DIAGNOSIS — I4819 Other persistent atrial fibrillation: Secondary | ICD-10-CM

## 2018-01-15 NOTE — Progress Notes (Signed)
PCP: [name], MD  Clinic Note: Chief Complaint  Patient presents with  . Hospitalization Follow-up  . Atrial Fibrillation    Chronic/persistent.  Usually asymptomatic    HPI: [name] is a 84 y.o. female with a PMH below who presents today for delayed 6 month f/u (delayed due to recent hospitalization) for her Atrial Fibrillation (s/p PPM for CHB). She has a history of chronic atrial fibrillation status post pacemaker placement for syncope with complete heart block in June 2016. At that time she had a syncopal event and fell fracturing her hip.  [name] was last seen in June 2018 as f/u for a Myoview ST ordered to evaluate exertional dyspnea/chest pressure --> read as LOW RISK.   Recent Hospitalizations:   ER visit November 15, 2017 for productive cough  Hospitalized January 22-24 2019 --persistent cough for 2 weeks with wheezing after unsuccessful treatment course with clarithromycin.  Treated with aggressive pulmonary toilet and breathing treatments -escalated antibiotics up to Levaquin   Readmitted from January 25-26 return with -hypoxia coughing etc along with orthopnea -there was concern for volume overload since her weight was up to having 3 kg.  She was IV diuresed.  Bounce back on January 31 now with acute renal insufficiency. -->  Apparently she had been diuresed well below her usual dry weight and was quite dehydrated. -->  Treated with IVF hydration with gradual recovery of renal function.  Studies Personally Reviewed - (if available, images/films reviewed: From Epic Chart or Care Everywhere)  No recent study  Interval History: Her home weight and feels much better.   [name] presents today actually back to what is usually her dry weight.  She actually is finally starting to feel better, and is more able to get at present, she is actually doing fine without any major cardiac symptoms such as dyspnea, PND orthopnea.  She is not noticing any irregular  heartbeats or palpitations.  No syncope or near syncope.  No TIA or amaurosis fugax symptoms.  Since her hospital stays, she has not had any lightheadedness or dizziness.  As long as her weight is stable, she is actually doing fine and her edema is well controlled. She is really limited by general weakness, but denies any chest tightness or pressure with rest or exertion.  No further PND or orthopnea.  No palpitations, lightheadedness, dizziness, weakness or syncope/near syncope. No TIA/amaurosis fugax symptoms. No melena, hematochezia, hematuria, or epstaxis. No claudication.  ROS: A comprehensive was performed. Review of Systems  Constitutional: Negative for malaise/fatigue.  HENT: Negative for congestion, ear discharge and nosebleeds.   Cardiovascular: Positive for palpitations.  Gastrointestinal: Negative for heartburn.  Neurological: Positive for dizziness.  All other systems reviewed and are negative.  I have reviewed and (if needed) personally updated the patient's problem list, medications, allergies, past medical and surgical history, social and family history.   Past Medical History:  Diagnosis Date  . Anemia   . Anxiety   . Arthritis   . Asthma   . Benign positional vertigo   . Cardiac pacemaker in situ 05/01/2015   for syncope & Sx Bradycardia -> Medtronic MRI compatible pacemaker placed Dr. Caryl Comes   . Chronic diarrhea   . Chronic kidney disease, stage IV (severe) ([place])   . Colon polyps   . Diastolic dysfunction, left ventricle 11/03/2013  . Diverticulosis   . Gout   . Hemorrhoids   . Hyperlipidemia   . Hypertensive heart disease    no  significant RAS by MRA 2002- (<30% LRAS)   . Hypothyroidism   . IBS (irritable bowel syndrome)   . LBBB (left bundle branch block)- new 11/01/13 11/01/2013  . Paroxysmal atrial fibrillation 11/01/2013   Intermittent through the years and recurrent in December of 2014 treated with amiodarone;; Negative Myoview 10/2013  . Peripheral  vascular disease    81-27% LICA, 5-17% RICA by doppler 2009   . Sinus arrest 04/28/2015  . Syncope and collapse 04/28/2015   s/p PPM  . Tubulovillous adenoma 4/07    Past Surgical History:  Procedure Laterality Date  . APPENDECTOMY    . CATARACT EXTRACTION    . EP IMPLANTABLE DEVICE N/A 05/01/2015   Procedure: Pacemaker Implant;  Surgeon: Deboraha Sprang, MD;  Location: [facility];  Service: Cardiovascular;  Laterality: N/A;  . HIP ARTHROPLASTY Left 05/01/2015   Procedure: ARTHROPLASTY BIPOLAR HIP (HEMIARTHROPLASTY);  Surgeon: Rod Can, MD;  Location: [facility];  Service: Orthopedics;  Laterality: Left;  . LOW ANTERIOR BOWEL RESECTION  7/08  . [place] SURGERY  2000  . NM MYOVIEW LTD  03/18/2013   Negative for ischemia or infarction. EF 55%.  LOW RISK  . OOPHORECTOMY  1962   right  . squamous cell skin cancer removed    . TRANSTHORACIC ECHOCARDIOGRAM  10/2013, 02/2017   A) EF 55-60%, mild LVH, elevated bili pressures. Mild aortic valve calcification;; B) Normal LV size and function with mild LVH. Unable to assess diastolic function. Normal PA pressures. Mild MR,Mild AI    Current Meds  Medication Sig  . acetaminophen (TYLENOL) 325 MG tablet Take 2 tablets (650 mg total) by mouth every 6 (six) hours as needed for mild pain (or Fever >/= 101).  Marland Kitchen amLODipine (NORVASC) 5 MG tablet TAKE 1 TABLET (5 MG TOTAL) BY MOUTH DAILY.  . bisacodyl (DULCOLAX) 5 MG EC tablet Take 1 tablet (5 mg total) by mouth daily as needed for moderate constipation.  . CVS VITAMIN B12 1000 MCG tablet TAKE 1 TABLET BY MOUTH EVERY DAY (Patient taking differently: TAKE 1 TABLET (1046mcg) BY MOUTH EVERY DAY)  . feeding supplement, ENSURE ENLIVE, (ENSURE ENLIVE) LIQD Take 237 mLs by mouth 2 (two) times daily between meals.  . fluticasone (FLOVENT HFA) 110 MCG/ACT inhaler Inhale 2 puffs into the lungs 2 (two) times daily.  . furosemide (LASIX) 40 MG tablet Take 0.5 tablets (20 mg total) by mouth daily as needed.  Take 40mg  only if more than 2-3 pounds overnight and/or 5 pounds in a week  . hydrALAZINE (APRESOLINE) 50 MG tablet Take 50 mg by mouth 2 (two) times daily.  Marland Kitchen Hyoscyamine Sulfate SL (LEVSIN/SL) 0.125 MG SUBL Take 1-2 tablets by mouth every 4 hours as needed  . ipratropium-albuterol (DUONEB) 0.5-2.5 (3) MG/3ML SOLN Take 3 mLs by nebulization 3 (three) times daily.  Marland Kitchen levothyroxine (SYNTHROID, LEVOTHROID) 88 MCG tablet TAKE 1 TABLET (88 MCG TOTAL) BY MOUTH DAILY BEFORE BREAKFAST.  Marland Kitchen LORazepam (ATIVAN) 0.5 MG tablet TAKE 1 TABLET BY MOUTH EVERY 8 HOURS AS NEEDED  . nitroGLYCERIN (NITROSTAT) 0.4 MG SL tablet Place 1 tablet (0.4 mg total) under the tongue every 5 (five) minutes x 3 doses as needed for chest pain.  Marland Kitchen omeprazole (PRILOSEC) 20 MG capsule Take 1 capsule (20 mg total) by mouth daily.  . potassium chloride 20 MEQ TBCR Take 20 mEq by mouth daily as needed. Take only if you are taking your Lasix that day.  . temazepam (RESTORIL) 15 MG capsule Take 2  capsules (30 mg total) by mouth at bedtime as needed for sleep.  Marland Kitchen ULORIC 40 MG tablet Take 40 mg by mouth daily.   Marland Kitchen UNABLE TO FIND Nebulizer machine for reactive airway disease. J45.909  . [DISCONTINUED] ELIQUIS 2.5 MG TABS tablet TAKE 1 TABLET BY MOUTH TWICE A DAY (Patient taking differently: TAKE 1 TABLET (2.5mg ) BY MOUTH TWICE A DAY)    Allergies  Allergen Reactions  . Penicillins Swelling    REACTION: nausea, swelling  . Xifaxan [Rifaximin] Other (See Comments) and Rash    unknown  . Ambien [Zolpidem Tartrate]     " made her crazy "  . Amoxicillin     REACTION: unspecified  . Ceftin [Cefuroxime Axetil] Diarrhea  . Codeine Phosphate     REACTION: unspecified  . Colchicine     Severe diarrhea  . Levofloxacin Other (See Comments)    Tingle numbness  . Lipitor [Atorvastatin] Other (See Comments)    "makes legs jump all night"    Social History   Tobacco Use  . Smoking status: Never Smoker  . Smokeless tobacco: Never Used  .  Tobacco comment: never used tobacco  Substance Use Topics  . Alcohol use: No    Alcohol/week: 0.0 oz  . Drug use: No   Social History   Social History Narrative   She is a widowed mother of 2, one child is deceased. Grandmother of one. She appears to be working out routinely at [employer] using [employer]. She is otherwise quite active.   She does not drink alcohol, does not smoke.  She is retired from [employer].    Lives alone in a one story condo.    family history includes Cancer (age of onset: 62) in her mother; Colon cancer in her daughter; Heart attack in her father; Heart disease in her brother; Lung cancer in her maternal grandfather; Stroke in her maternal grandmother and paternal grandmother.  Wt Readings from Last 3 Encounters:  01/15/18 129 lb 12.8 oz (58.9 kg)  12/23/17 139 lb 6.4 oz (63.2 kg)  12/17/17 147 lb 11.3 oz (67 kg)    PHYSICAL EXAM BP (!) 155/79   Pulse 76   Ht 5\' 2"  (1.575 m)   Wt 129 lb 12.8 oz (58.9 kg)   SpO2 100%   BMI 23.74 kg/m  Physical Exam  Constitutional: She is oriented to person, place, and time. She appears well-developed and well-nourished.  Appears younger than stated age  HENT:  Head: Normocephalic and atraumatic.  Irregular shaped pupils status post surgery  Eyes: EOM are normal.  Neck: No tracheal deviation present.  Cardiovascular: Normal rate, regular rhythm, S1 normal and intact distal pulses.  Occasional extrasystoles are present. PMI is not displaced. Exam reveals no gallop and no friction rub.  Murmur (1/6 SEM at RUSB no radiation) heard. Split S2 from a branch block.  Pulmonary/Chest: Effort normal and breath sounds normal. No respiratory distress. She has no wheezes. She has no rales.  Abdominal: Bowel sounds are normal. She exhibits no distension. There is no tenderness. There is no rebound.  Musculoskeletal: Normal range of motion. She exhibits edema.  Neurological: She is alert and oriented to  person, place, and time.  Skin: Skin is warm and dry. No erythema.  Psychiatric: She has a normal mood and affect. Her behavior is normal. Judgment and thought content normal.  Nursing note reviewed.     Adult ECG Report Not checked  Other studies Reviewed: Additional studies/  records that were reviewed today include:  Recent Labs:    Lab Results  Component Value Date   CREATININE 1.96 (H) 12/23/2017   BUN 43 (H) 12/23/2017   NA 139 12/23/2017   K 4.9 12/23/2017   CL 99 12/23/2017   CO2 26 12/23/2017    ASSESSMENT / PLAN: Problem List Items Addressed This Visit    Systolic hypertension, isolated; difficult to control (Chronic)    At this point I think the pressure that she is having today of 135/79 is pretty good for her.  Target ranges probably from 967-591 systolic.  I do not want to be overly aggressive.  She is taking low-dose amlodipine along with hydralazine 50 twice daily. She was not restarted on her carvedilol following the last hospitalization  To avoid slow heart rate response, we will stay off for now.      Relevant Medications   hydrALAZINE (APRESOLINE) 50 MG tablet   Persistent Paroxysmal atrial fibrillation: CHA2DS2-VASc Score 5, on Eliquis (Chronic)    At this point, she really does not know if she is or is not in A. Fib. Currently not on a beta-blocker or anything for rate control.  Need to watch for any signs of tachycardia.  For now however will consistently stay off. If pressures continue to ride higher, we will probably end up restarting low-dose beta-blocker.  But for now will allow for blood pressure to be higher.  She may have had some recurrence while in the hospital, but seems to be out of A. fib at this point. Remains on Eliquis and low-dose with no complications.      Relevant Medications   hydrALAZINE (APRESOLINE) 50 MG tablet   Hypertensive heart disease (Chronic)    Difficult situation where I am leery of her having blood pressures with her  recent kidney issues.  At this point we will continue with her current medications and allow her to have a slightly high blood pressure/permissive hypertension.  However if heart rates increased, will need to restart beta-blocker.      Relevant Medications   hydrALAZINE (APRESOLINE) 50 MG tablet      Sliding scale Lasix for edema with current weight is a dry weight.  I think the renal insufficiency was because of over diuresis.  Current medicines are reviewed at length with the patient today. (+/- concerns) concerned about over diuresis.  The following changes have been made: I think current weight is probably stable --> change his Lasix to sliding scale with standing dose of 20 mg and additional doses.  (See below.)   Patient Instructions  medication instructions   Sliding scale Lasix: Weigh yourself when you get home, then Daily in the Morning. Your dry weight will be what your scale says on the day you return home.(here is 127 lbs.).   If you gain more than 3 pounds from dry weight: Increase the Lasix dosing to 20  mg in the morning and 20 mg in the afternoon until weight returns to baseline dry weight 127 lbs.  If weight gain is greater than 5 pounds in 2 days: Increased to Lasix  40 mg  In morning and 20 mg  Evening  UNTIL WEIGHT IS 127 LB  and contact the office for further assistance if weight does not go down the next day.  If the weight goes down more than 3 pounds from dry weight: Hold Lasix until it returns to baseline dry weight    if blood pressure  Systolic blood pressure  Greater than 160  - take extra hydralazine 50 mg .  No other changes with medication   Your physician recommends that you schedule a follow-up appointment in 4 months with DR Ryane Canavan.    Studies Ordered:   No orders of the defined types were placed in this encounter.     Glenetta Hew, M.D., M.S. Interventional Cardiologist   Pager # [phone] Phone # [phone] [address] [place], [place] [address]   Thank you for choosing Heartcare at [facility]!!

## 2018-01-15 NOTE — Patient Instructions (Signed)
medication instructions   Sliding scale Lasix: Weigh yourself when you get home, then Daily in the Morning. Your dry weight will be what your scale says on the day you return home.(here is 127 lbs.).   If you gain more than 3 pounds from dry weight: Increase the Lasix dosing to 20  mg in the morning and 20 mg in the afternoon until weight returns to baseline dry weight 127 lbs.  If weight gain is greater than 5 pounds in 2 days: Increased to Lasix  40 mg  In morning and 20 mg  Evening  UNTIL WEIGHT IS 127 LB  and contact the office for further assistance if weight does not go down the next day.  If the weight goes down more than 3 pounds from dry weight: Hold Lasix until it returns to baseline dry weight    if blood pressure  Systolic blood pressure  Greater than 160  - take extra hydralazine 50 mg .  No other changes with medication   Your physician recommends that you schedule a follow-up appointment in 4 months with DR HARDING.

## 2018-01-17 NOTE — Assessment & Plan Note (Signed)
At this point I think the pressure that she is having today of 135/79 is pretty good for her.  Target ranges probably from 163-845 systolic.  I do not want to be overly aggressive.  She is taking low-dose amlodipine along with hydralazine 50 twice daily. She was not restarted on her carvedilol following the last hospitalization  To avoid slow heart rate response, we will stay off for now.

## 2018-01-17 NOTE — Assessment & Plan Note (Signed)
Difficult situation where I am leery of her having blood pressures with her recent kidney issues.  At this point we will continue with her current medications and allow her to have a slightly high blood pressure/permissive hypertension.  However if heart rates increased, will need to restart beta-blocker.

## 2018-01-17 NOTE — Assessment & Plan Note (Addendum)
At this point, she really does not know if she is or is not in A. Fib. Currently not on a beta-blocker or anything for rate control.  Need to watch for any signs of tachycardia.  For now however will consistently stay off. If pressures continue to ride higher, we will probably end up restarting low-dose beta-blocker.  But for now will allow for blood pressure to be higher.  She may have had some recurrence while in the hospital, but seems to be out of A. fib at this point. Remains on Eliquis and low-dose with no complications.

## 2018-01-27 DIAGNOSIS — F419 Anxiety disorder, unspecified: Secondary | ICD-10-CM | POA: Diagnosis not present

## 2018-01-27 DIAGNOSIS — J449 Chronic obstructive pulmonary disease, unspecified: Secondary | ICD-10-CM

## 2018-01-27 DIAGNOSIS — I739 Peripheral vascular disease, unspecified: Secondary | ICD-10-CM | POA: Diagnosis not present

## 2018-01-27 DIAGNOSIS — M109 Gout, unspecified: Secondary | ICD-10-CM

## 2018-01-27 DIAGNOSIS — N184 Chronic kidney disease, stage 4 (severe): Secondary | ICD-10-CM | POA: Diagnosis not present

## 2018-01-27 DIAGNOSIS — E785 Hyperlipidemia, unspecified: Secondary | ICD-10-CM | POA: Diagnosis not present

## 2018-01-27 DIAGNOSIS — I481 Persistent atrial fibrillation: Secondary | ICD-10-CM | POA: Diagnosis not present

## 2018-01-27 DIAGNOSIS — G4700 Insomnia, unspecified: Secondary | ICD-10-CM | POA: Diagnosis not present

## 2018-01-27 DIAGNOSIS — E039 Hypothyroidism, unspecified: Secondary | ICD-10-CM

## 2018-01-27 DIAGNOSIS — E876 Hypokalemia: Secondary | ICD-10-CM | POA: Diagnosis not present

## 2018-01-27 DIAGNOSIS — I5033 Acute on chronic diastolic (congestive) heart failure: Secondary | ICD-10-CM | POA: Diagnosis not present

## 2018-01-27 DIAGNOSIS — I130 Hypertensive heart and chronic kidney disease with heart failure and stage 1 through stage 4 chronic kidney disease, or unspecified chronic kidney disease: Secondary | ICD-10-CM | POA: Diagnosis not present

## 2018-02-18 ENCOUNTER — Telehealth: Payer: Self-pay | Admitting: Cardiology

## 2018-02-18 NOTE — Telephone Encounter (Signed)
   [facility] Pre-operative Risk Assessment    Request for surgical clearance:  1. What type of surgery is being performed? Cervical selective nerve root block   2. When is this surgery scheduled? 03/10/2018   3. What type of clearance is required (medical clearance vs. Pharmacy clearance to hold med vs. Both)? Both  4. Are there any medications that need to be held prior to surgery and how long? Eliquis - 3 days prior  5. Practice name and name of physician performing surgery? [facility]   6. What is your office phone number - [phone] (ext: [phone] for Andria Rhein)    7.   What is your office fax number - [phone]  8.   Anesthesia type (None, local, MAC, general) ? None    Sheral Apley M 02/18/2018, 3:22 PM  _________________________________________________________________   (provider comments below)

## 2018-02-19 NOTE — Telephone Encounter (Signed)
I think for this type of procedure - would err on the side of caution. Lets go with 4 day hold.  [name]

## 2018-02-19 NOTE — Telephone Encounter (Signed)
Patient with diagnosis of afib on Eliquis for anticoagulation.    Procedure: cervical selective nerve root block Date of procedure: 03/10/18  CHADS2-VASc score of  6 (CHF, HTN, AGE, DM2, stroke/tia x 2, CAD, AGE, female)  CrCl 67ml/min  With spinal procedure and current renal function would recommend hold Eliquis at least 3 days. Will route to Dr. Ellyn Hack to see if 4 day hold may be appropriate given kidney function and cardiovascular risk.

## 2018-02-20 NOTE — Telephone Encounter (Signed)
   Primary Cardiologist: Glenetta Hew, MD  Chart reviewed as part of pre-operative protocol coverage. Given past medical history and time since last visit, based on ACC/AHA guidelines, SIMONA ROCQUE would be at acceptable risk for the planned procedure without further cardiovascular testing. Per Dr. Ellyn Hack, Hold Eliquis 4 days prior to procedure.   I will route this recommendation to the requesting party via Epic fax function and remove from pre-op pool.  Please call with questions.  Lyda Jester, PA-C 02/20/2018, 2:11 PM

## 2018-03-09 ENCOUNTER — Ambulatory Visit (INDEPENDENT_AMBULATORY_CARE_PROVIDER_SITE_OTHER): Payer: Medicare Other | Admitting: *Deleted

## 2018-03-09 DIAGNOSIS — I495 Sick sinus syndrome: Secondary | ICD-10-CM | POA: Diagnosis not present

## 2018-03-10 ENCOUNTER — Encounter: Payer: Self-pay | Admitting: Cardiology

## 2018-03-10 NOTE — Progress Notes (Signed)
Remote pacemaker transmission.   

## 2018-03-11 LAB — CUP PACEART REMOTE DEVICE CHECK
Battery Remaining Longevity: 66 mo
Brady Statistic AP VS Percent: 0 %
Brady Statistic AS VP Percent: 90.27 %
Brady Statistic RA Percent Paced: 7.1 %
Date Time Interrogation Session: 20190429164723
Implantable Lead Implant Date: 20160620
Implantable Lead Location: 753860
Implantable Lead Model: 5076
Implantable Lead Model: 5076
Lead Channel Impedance Value: 304 Ohm
Lead Channel Pacing Threshold Pulse Width: 0.4 ms
Lead Channel Pacing Threshold Pulse Width: 0.4 ms
Lead Channel Sensing Intrinsic Amplitude: 0.5 mV
Lead Channel Sensing Intrinsic Amplitude: 0.5 mV
MDC IDC LEAD IMPLANT DT: 20160620
MDC IDC LEAD LOCATION: 753859
MDC IDC MSMT BATTERY VOLTAGE: 3 V
MDC IDC MSMT LEADCHNL RA IMPEDANCE VALUE: 361 Ohm
MDC IDC MSMT LEADCHNL RA PACING THRESHOLD AMPLITUDE: 0.75 V
MDC IDC MSMT LEADCHNL RV IMPEDANCE VALUE: 380 Ohm
MDC IDC MSMT LEADCHNL RV IMPEDANCE VALUE: 456 Ohm
MDC IDC MSMT LEADCHNL RV PACING THRESHOLD AMPLITUDE: 0.625 V
MDC IDC MSMT LEADCHNL RV SENSING INTR AMPL: 12.25 mV
MDC IDC MSMT LEADCHNL RV SENSING INTR AMPL: 12.25 mV
MDC IDC PG IMPLANT DT: 20160620
MDC IDC SET LEADCHNL RA PACING AMPLITUDE: 1.5 V
MDC IDC SET LEADCHNL RV PACING AMPLITUDE: 2 V
MDC IDC SET LEADCHNL RV PACING PULSEWIDTH: 0.4 ms
MDC IDC SET LEADCHNL RV SENSING SENSITIVITY: 4 mV
MDC IDC STAT BRADY AP VP PERCENT: 9.31 %
MDC IDC STAT BRADY AS VS PERCENT: 0.41 %
MDC IDC STAT BRADY RV PERCENT PACED: 99.46 %

## 2018-03-22 ENCOUNTER — Other Ambulatory Visit: Payer: Self-pay | Admitting: Cardiology

## 2018-03-23 NOTE — Telephone Encounter (Signed)
REFILL 

## 2018-04-07 ENCOUNTER — Encounter: Payer: Self-pay | Admitting: Family Medicine

## 2018-04-28 ENCOUNTER — Telehealth: Payer: Self-pay | Admitting: Cardiology

## 2018-04-28 NOTE — Telephone Encounter (Signed)
Received records from [facility] on 04/28/18, Appt 07/16/@ 11:46AM          [place].

## 2018-05-03 ENCOUNTER — Other Ambulatory Visit: Payer: Self-pay | Admitting: Family Medicine

## 2018-05-19 ENCOUNTER — Other Ambulatory Visit: Payer: Self-pay | Admitting: Gastroenterology

## 2018-05-19 ENCOUNTER — Other Ambulatory Visit: Payer: Self-pay | Admitting: Family Medicine

## 2018-05-26 ENCOUNTER — Encounter: Payer: Self-pay | Admitting: Cardiology

## 2018-05-26 ENCOUNTER — Ambulatory Visit: Payer: Medicare Other | Admitting: Cardiology

## 2018-05-26 VITALS — BP 153/80 | HR 90 | Ht 62.0 in | Wt 133.0 lb

## 2018-05-26 DIAGNOSIS — I5032 Chronic diastolic (congestive) heart failure: Secondary | ICD-10-CM

## 2018-05-26 DIAGNOSIS — R55 Syncope and collapse: Secondary | ICD-10-CM

## 2018-05-26 DIAGNOSIS — I10 Essential (primary) hypertension: Secondary | ICD-10-CM

## 2018-05-26 DIAGNOSIS — Z7901 Long term (current) use of anticoagulants: Secondary | ICD-10-CM

## 2018-05-26 DIAGNOSIS — I4819 Other persistent atrial fibrillation: Secondary | ICD-10-CM

## 2018-05-26 DIAGNOSIS — I481 Persistent atrial fibrillation: Secondary | ICD-10-CM | POA: Diagnosis not present

## 2018-05-26 MED ORDER — POTASSIUM CHLORIDE ER 20 MEQ PO TBCR: 20.0000 meq | EXTENDED_RELEASE_TABLET | Freq: Every day | ORAL | 3 refills | Status: DC | PRN

## 2018-05-26 MED ORDER — APIXABAN 2.5 MG PO TABS: 2.5000 mg | ORAL_TABLET | Freq: Two times a day (BID) | ORAL | 3 refills | Status: DC

## 2018-05-26 MED ORDER — HYDRALAZINE HCL 50 MG PO TABS: 50.0000 mg | ORAL_TABLET | Freq: Two times a day (BID) | ORAL | 3 refills | Status: DC

## 2018-05-26 MED ORDER — AMLODIPINE BESYLATE 5 MG PO TABS: 5.0000 mg | ORAL_TABLET | Freq: Every day | ORAL | 3 refills | Status: DC

## 2018-05-26 NOTE — Patient Instructions (Addendum)
MEDICATION INSTRUCTIONS    CHANGE DRY WEIGHT TO 132 TO 133 LBS IF WEIGHT IS ABOVE  THE DRY WEIGHT  3 LBS OR MORE-YOU MAY USE LASIX ( FUROSEMIDE) AS NEEDED.     Your physician recommends that you schedule a follow-up appointment in [place].     Your physician wants you to follow-up in [place]. You will receive a reminder letter in the mail two months in advance. If you don't receive a letter, please call our office to schedule the follow-up appointment.

## 2018-05-26 NOTE — Progress Notes (Signed)
PCP: [name], MD  Clinic Note: Chief Complaint  Patient presents with  . Follow-up    Doing well  . Atrial Fibrillation  . Hypertension    Has a long daily blood pressure heart rate and weight log (reviewed)    HPI: [name] is a 84 y.o. female with a PMH below who presents today for four-month follow-up for her Atrial Fibrillation (s/p PPM for CHB). She has a history of chronic atrial fibrillation status post pacemaker placement for syncope with complete heart block in June 2016. At that time she had a syncopal event and fell fracturing her hip. She was seen in June 2018 as f/u for a Myoview ST in May 2018 ordered to evaluate exertional dyspnea/chest pressure --> read as LOW RISK.   MADDIX KLIEWER was last seen in March 2019 for difficult to control hypertension   In response to several hospitalizations. -->  ER visit and hospitalization in January for cough then readmitted with hypoxia and orthopnea felt to be volume overloaded and requiring IV diuresis.  She then went back shortly then again after with renal insufficiency having been over diuresed.  Recent Hospitalizations:   None since last visit  Studies Personally Reviewed - (if available, images/films reviewed: From Epic Chart or Care Everywhere)  No recent study --just on lying> pacemaker evaluation.  Interval History: [name] presents here today doing well.  She is bothered more by back and knee pain and mild immobility, but is not really having any cardiac symptoms.  She has not noted any irregular heartbeats or palpitations.   she does have a little bit of dyspnea right now but is also dealing with some allergy issues and congestion.  She has not been sleeping very well as a result.  Last night she had a horrible night was coughing and dealing with allergies.  She is therefore low but on meds today and her blood pressure is higher than usual.  Usually at home is in the 607P systolic. They brought with them  basically are read out of blood pressures ranging all the way back to March and her blood pressures have been pretty much in the 130 range.  Her weight has been relatively stable to but it is slightly drifted up from when I last saw her and she thinks this is probably because she is actually truly gained weight from eating better.  Probably have good dry weight for her now is about 132-133 pounds.  She really does not have any significant edema and for the most part her breathing is fine.  No PND orthopnea.  She does occasionally have some small amount of edema, and weight goes up and therefore she takes an additional dose of Lasix but not very frequently.  She is doing pretty well with her standing dose of 20mg  as needed that she takes 3 days a week and standing.  Not usually having to use the 40 mg dose.  She notes occasional flip-flopping or fluttering but no prolonged episodes to suggest A. fib. No melena, hematochezia, hematuria or epistaxis.  Activity level is just limited by her arthritis pains back neck and knees as well as generalized weakness.  She really does not do enough walking around to note if she has claudication.   ROS: A comprehensive was performed. Review of Systems  Constitutional: Negative for malaise/fatigue and weight loss (Weights have been stable and she may have put on about 4 pounds of true weight since I last saw  her.).  HENT: Positive for congestion. Negative for ear discharge and nosebleeds.        Has some allergy issues  Respiratory: Positive for cough (Right now with allergies) and shortness of breath (If she tries to do something fast).   Cardiovascular: Positive for palpitations.  Gastrointestinal: Negative for heartburn.  Genitourinary: Negative for dysuria and hematuria.  Musculoskeletal: Positive for back pain and joint pain. Negative for falls and myalgias.  Neurological: Positive for dizziness (Standing up quickly).  Endo/Heme/Allergies: Positive for  environmental allergies.  Psychiatric/Behavioral: Positive for memory loss. Negative for depression. The patient has insomnia (Has had good and bad nights).   All other systems reviewed and are negative.  I have reviewed and (if needed) personally updated the patient's problem list, medications, allergies, past medical and surgical history, social and family history.   Past Medical History:  Diagnosis Date  . Anemia   . Anxiety   . Arthritis   . Asthma   . Benign positional vertigo   . Cardiac pacemaker in situ 05/01/2015   for syncope & Sx Bradycardia -> Medtronic MRI compatible pacemaker placed Dr. Caryl Comes   . Chronic diarrhea   . Chronic kidney disease, stage IV (severe) ([place])   . Colon polyps   . Diastolic dysfunction, left ventricle 11/03/2013  . Diverticulosis   . Gout   . Hemorrhoids   . Hyperlipidemia   . Hypertensive heart disease    no significant RAS by MRA 2002- (<30% LRAS)   . Hypothyroidism   . IBS (irritable bowel syndrome)   . LBBB (left bundle branch block)- new 11/01/13 11/01/2013  . Paroxysmal atrial fibrillation 11/01/2013   Intermittent through the years and recurrent in December of 2014 treated with amiodarone;; Negative Myoview 10/2013  . Peripheral vascular disease    32-99% LICA, 2-42% RICA by doppler 2009   . Sinus arrest 04/28/2015  . Syncope and collapse 04/28/2015   s/p PPM  . Tubulovillous adenoma 4/07    Past Surgical History:  Procedure Laterality Date  . APPENDECTOMY    . CATARACT EXTRACTION    . EP IMPLANTABLE DEVICE N/A 05/01/2015   Procedure: Pacemaker Implant;  Surgeon: Deboraha Sprang, MD;  Location: [facility];  Service: Cardiovascular;  Laterality: N/A;  . HIP ARTHROPLASTY Left 05/01/2015   Procedure: ARTHROPLASTY BIPOLAR HIP (HEMIARTHROPLASTY);  Surgeon: Rod Can, MD;  Location: [facility];  Service: Orthopedics;  Laterality: Left;  . LOW ANTERIOR BOWEL RESECTION  7/08  . [place] SURGERY  2000  . NM MYOVIEW LTD  03/18/2013    Negative for ischemia or infarction. EF 55%.  LOW RISK  . OOPHORECTOMY  1962   right  . squamous cell skin cancer removed    . TRANSTHORACIC ECHOCARDIOGRAM  10/2013, 02/2017   A) EF 55-60%, mild LVH, elevated bili pressures. Mild aortic valve calcification;; B) Normal LV size and function with mild LVH. Unable to assess diastolic function. Normal PA pressures. Mild MR,Mild AI    Current Meds  Medication Sig  . acetaminophen (TYLENOL) 325 MG tablet Take 2 tablets (650 mg total) by mouth every 6 (six) hours as needed for mild pain (or Fever >/= 101).  Marland Kitchen amLODipine (NORVASC) 5 MG tablet Take 1 tablet (5 mg total) by mouth daily.  Marland Kitchen apixaban (ELIQUIS) 2.5 MG TABS tablet Take 1 tablet (2.5 mg total) by mouth 2 (two) times daily.  . bisacodyl (DULCOLAX) 5 MG EC tablet Take 1 tablet (5 mg total) by mouth daily as needed for  moderate constipation.  . CVS VITAMIN B12 1000 MCG tablet TAKE 1 TABLET BY MOUTH EVERY DAY  . fluticasone (FLOVENT HFA) 110 MCG/ACT inhaler Inhale 2 puffs into the lungs 2 (two) times daily.  . furosemide (LASIX) 40 MG tablet Take 0.5 tablets (20 mg total) by mouth daily as needed. Take 40mg  only if more than 2-3 pounds overnight and/or 5 pounds in a week  . hydrALAZINE (APRESOLINE) 50 MG tablet Take 1 tablet (50 mg total) by mouth 2 (two) times daily.  Marland Kitchen Hyoscyamine Sulfate SL (LEVSIN/SL) 0.125 MG SUBL Take 1-2 tablets by mouth every 4 hours as needed  . levothyroxine (SYNTHROID, LEVOTHROID) 88 MCG tablet TAKE 1 TABLET (88 MCG TOTAL) BY MOUTH DAILY BEFORE BREAKFAST.  Marland Kitchen LORazepam (ATIVAN) 0.5 MG tablet TAKE 1 TABLET BY MOUTH EVERY 8 HOURS AS NEEDED  . nitroGLYCERIN (NITROSTAT) 0.4 MG SL tablet Place 1 tablet (0.4 mg total) under the tongue every 5 (five) minutes x 3 doses as needed for chest pain.  Marland Kitchen omeprazole (PRILOSEC) 20 MG capsule TAKE 1 CAPSULE BY MOUTH EVERY DAY  . Potassium Chloride ER 20 MEQ TBCR Take 20 mEq by mouth daily as needed. Take only if you are taking your  Lasix that day.  . temazepam (RESTORIL) 15 MG capsule Take 2 capsules (30 mg total) by mouth at bedtime as needed for sleep.  Marland Kitchen ULORIC 40 MG tablet Take 40 mg by mouth daily.   Marland Kitchen UNABLE TO FIND Nebulizer machine for reactive airway disease. J45.909  . [DISCONTINUED] amLODipine (NORVASC) 5 MG tablet TAKE 1 TABLET (5 MG TOTAL) BY MOUTH DAILY.  . [DISCONTINUED] ELIQUIS 2.5 MG TABS tablet TAKE 1 TABLET BY MOUTH TWICE A DAY  . [DISCONTINUED] hydrALAZINE (APRESOLINE) 50 MG tablet Take 50 mg by mouth 2 (two) times daily.  . [DISCONTINUED] potassium chloride 20 MEQ TBCR Take 20 mEq by mouth daily as needed. Take only if you are taking your Lasix that day.    Allergies  Allergen Reactions  . Penicillins Swelling    REACTION: nausea, swelling  . Xifaxan [Rifaximin] Other (See Comments) and Rash    unknown  . Ambien [Zolpidem Tartrate]     " made her crazy "  . Amoxicillin     REACTION: unspecified  . Ceftin [Cefuroxime Axetil] Diarrhea  . Codeine Phosphate     REACTION: unspecified  . Colchicine     Severe diarrhea  . Levofloxacin Other (See Comments)    Tingle numbness  . Lipitor [Atorvastatin] Other (See Comments)    "makes legs jump all night"    Social History   Tobacco Use  . Smoking status: Never Smoker  . Smokeless tobacco: Never Used  . Tobacco comment: never used tobacco  Substance Use Topics  . Alcohol use: No    Alcohol/week: 0.0 oz  . Drug use: No   Social History   Social History Narrative   She is a widowed mother of 2, one child is deceased. Grandmother of one. She appears to be working out routinely at [employer] using [employer]. She is otherwise quite active.   She does not drink alcohol, does not smoke.  She is retired from [employer].    Lives alone in a one story condo.    family history includes Cancer (age of onset: 3) in her mother; Colon cancer in her daughter; Heart attack in her father; Heart disease in her brother; Lung  cancer in her maternal grandfather; Stroke in her maternal grandmother  and paternal grandmother.  Wt Readings from Last 3 Encounters:  05/26/18 133 lb (60.3 kg)  01/15/18 129 lb 12.8 oz (58.9 kg)  12/23/17 139 lb 6.4 oz (63.2 kg)   Blood pressure log from March until now shows steadily improved blood pressures from the 160s 170s down to mostly 130s to 140s.  Some readings in the 481E systolic but relatively stable otherwise.  Heart rates have been in 60s to 80s.  Weights are mostly around 130 -132 pounds.  PHYSICAL EXAM BP (!) 153/80   Pulse 90   Ht 5\' 2"  (1.575 m)   Wt 133 lb (60.3 kg)   BMI 24.33 kg/m  Physical Exam  Constitutional: She is oriented to person, place, and time. She appears well-developed and well-nourished.  Appears younger than stated age  HENT:  Head: Normocephalic and atraumatic.  Irregular shaped pupils status post surgery  Neck: No JVD present. No tracheal deviation present.  Cardiovascular: Normal rate, regular rhythm, S1 normal and intact distal pulses.  Occasional extrasystoles are present. PMI is not displaced. Exam reveals no gallop and no friction rub.  Murmur (1/6 SEM at RUSB no radiation) heard. Split S2 from a branch block.  Pulmonary/Chest: Effort normal and breath sounds normal. No respiratory distress. She has no wheezes. She has no rales.  Abdominal: Bowel sounds are normal. She exhibits no distension. There is no tenderness. There is no rebound.  Musculoskeletal: Normal range of motion. She exhibits edema (Trivial).  Neurological: She is alert and oriented to person, place, and time.  Skin: Skin is warm and dry. No erythema.  Psychiatric: She has a normal mood and affect. Her behavior is normal. Judgment and thought content normal.  Nursing note and vitals reviewed.     Adult ECG Report Not checked  Other studies Reviewed: Additional studies/ records that were reviewed today include:  Recent Labs: April 23, 2018 from  nephrologist  BUN/creatinine: 43/1.76.   ASSESSMENT / PLAN: Problem List Items Addressed This Visit    Systolic hypertension, isolated; difficult to control (Chronic)    She has actually shown that her blood pressure has been relatively well controlled now.  She never did get restarted back on carvedilol after her last hospitalization is doing fine without it.  She is on hydralazine for afterload reduction and amlodipine.  For now we are holding off on beta-blocker or changing from amlodipine to diltiazem, but if her heart rate goes up we may need to consider that.  Since her heart rate and blood pressure looks great now although I would not want to change anything.      Relevant Medications   amLODipine (NORVASC) 5 MG tablet   apixaban (ELIQUIS) 2.5 MG TABS tablet   hydrALAZINE (APRESOLINE) 50 MG tablet   Syncope, cardiogenic (Chronic)    No further episodes since she had a pacemaker placed.      Relevant Medications   amLODipine (NORVASC) 5 MG tablet   apixaban (ELIQUIS) 2.5 MG TABS tablet   hydrALAZINE (APRESOLINE) 50 MG tablet   Persistent Paroxysmal atrial fibrillation: CHA2DS2-VASc Score 5, on Eliquis (Chronic)    She sounds like she is in a sinus rhythm today.  She does have some ectopy making it hard but sounds mostly regular.  Rate is controlled at 90.  She is on Eliquis for anticoagulation at low dose.  She is not on a rate control agent  for now.  But if her heart rate were to go faster, would probably need to add back a  beta-blocker.  We are trying to allow for mild permissive hypertension so have been leery of adding it back.      Relevant Medications   amLODipine (NORVASC) 5 MG tablet   apixaban (ELIQUIS) 2.5 MG TABS tablet   hydrALAZINE (APRESOLINE) 50 MG tablet   Chronic diastolic heart failure (HCC) (Chronic)    Probably more diastolic dysfunction exacerbated by A. fib.  She clearly has diastolic dysfunction just based on her age.  Currently on a standing dose of  Lasix with sliding scale adjustments.  We have adjusted her dry weight back up to 132-133 pounds.  She is probably gained back some weight that she had lost when she was sick.  She actually seems euvolemic on exam. She is on hydralazine for afterload reduction along with amlodipine.  We could potentially consider switching from amlodipine to diltiazem if we need to rate control.      Relevant Medications   amLODipine (NORVASC) 5 MG tablet   apixaban (ELIQUIS) 2.5 MG TABS tablet   hydrALAZINE (APRESOLINE) 50 MG tablet   Chronic anticoagulation- Eliquis (Chronic)    CHADs VASc = 4 on low-dose Eliquis for age and weight along with renal insufficiency.  Monitor for bleeding issues.  Seems stable.         Sliding scale Lasix for edema with current weight is a dry weight.  I think the renal insufficiency was because of over diuresis.  Current medicines are reviewed at length with the patient today. (+/- concerns) -discussed blood pressure levels and stable weight. The following changes have been made: I think current weight is probably stable target dry weight now should be 132-133 pounds--> change his Lasix to sliding scale with standing dose of 20 mg and additional doses.    Patient Instructions  MEDICATION INSTRUCTIONS    CHANGE DRY WEIGHT TO 132 TO 133 LBS IF WEIGHT IS ABOVE  THE DRY WEIGHT  3 LBS OR MORE-YOU MAY USE LASIX ( FUROSEMIDE) AS NEEDED.     Your physician recommends that you schedule a follow-up appointment in [place].     Your physician wants you to follow-up in [place]. You will receive a reminder letter in the mail two months in advance. If you don't receive a letter, please call our office to schedule the follow-up appointment.     Studies Ordered:   No orders of the defined types were placed in this encounter.     [name], M.D., M.S. Interventional Cardiologist   Pager # [phone] Phone # [phone] [address] [place], [place] [address]   Thank you for choosing [facility]!!

## 2018-05-29 ENCOUNTER — Encounter: Payer: Self-pay | Admitting: Cardiology

## 2018-05-29 NOTE — Assessment & Plan Note (Signed)
She sounds like she is in a sinus rhythm today.  She does have some ectopy making it hard but sounds mostly regular.  Rate is controlled at 90.  She is on Eliquis for anticoagulation at low dose.  She is not on a rate control agent  for now.  But if her heart rate were to go faster, would probably need to add back a beta-blocker.  We are trying to allow for mild permissive hypertension so have been leery of adding it back.

## 2018-05-29 NOTE — Assessment & Plan Note (Signed)
CHADs VASc = 4 on low-dose Eliquis for age and weight along with renal insufficiency.  Monitor for bleeding issues.  Seems stable.

## 2018-05-29 NOTE — Assessment & Plan Note (Signed)
Probably more diastolic dysfunction exacerbated by A. fib.  She clearly has diastolic dysfunction just based on her age.  Currently on a standing dose of Lasix with sliding scale adjustments.  We have adjusted her dry weight back up to 132-133 pounds.  She is probably gained back some weight that she had lost when she was sick.  She actually seems euvolemic on exam. She is on hydralazine for afterload reduction along with amlodipine.  We could potentially consider switching from amlodipine to diltiazem if we need to rate control.

## 2018-05-29 NOTE — Assessment & Plan Note (Signed)
No further episodes since she had a pacemaker placed.

## 2018-05-29 NOTE — Assessment & Plan Note (Signed)
She has actually shown that her blood pressure has been relatively well controlled now.  She never did get restarted back on carvedilol after her last hospitalization is doing fine without it.  She is on hydralazine for afterload reduction and amlodipine.  For now we are holding off on beta-blocker or changing from amlodipine to diltiazem, but if her heart rate goes up we may need to consider that.  Since her heart rate and blood pressure looks great now although I would not want to change anything.

## 2018-06-08 ENCOUNTER — Ambulatory Visit (INDEPENDENT_AMBULATORY_CARE_PROVIDER_SITE_OTHER): Payer: Medicare Other | Admitting: *Deleted

## 2018-06-08 DIAGNOSIS — I495 Sick sinus syndrome: Secondary | ICD-10-CM | POA: Diagnosis not present

## 2018-06-08 NOTE — Progress Notes (Signed)
Remote pacemaker transmission.   

## 2018-06-09 ENCOUNTER — Encounter: Payer: Self-pay | Admitting: Cardiology

## 2018-06-12 ENCOUNTER — Other Ambulatory Visit: Payer: Self-pay | Admitting: Gastroenterology

## 2018-06-12 LAB — CUP PACEART REMOTE DEVICE CHECK
Brady Statistic AP VP Percent: 10.19 %
Brady Statistic AP VS Percent: 0 %
Brady Statistic AS VP Percent: 89.4 %
Brady Statistic AS VS Percent: 0.41 %
Brady Statistic RA Percent Paced: 7.81 %
Brady Statistic RV Percent Paced: 99.6 %
Implantable Lead Implant Date: 20160620
Implantable Lead Location: 753860
Implantable Lead Model: 5076
Lead Channel Impedance Value: 285 Ohm
Lead Channel Impedance Value: 342 Ohm
Lead Channel Impedance Value: 361 Ohm
Lead Channel Pacing Threshold Amplitude: 0.625 V
Lead Channel Pacing Threshold Pulse Width: 0.4 ms
Lead Channel Pacing Threshold Pulse Width: 0.4 ms
Lead Channel Sensing Intrinsic Amplitude: 0.5 mV
Lead Channel Sensing Intrinsic Amplitude: 5.625 mV
Lead Channel Sensing Intrinsic Amplitude: 5.625 mV
Lead Channel Setting Pacing Pulse Width: 0.4 ms
Lead Channel Setting Sensing Sensitivity: 4 mV
MDC IDC LEAD IMPLANT DT: 20160620
MDC IDC LEAD LOCATION: 753859
MDC IDC MSMT BATTERY REMAINING LONGEVITY: 64 mo
MDC IDC MSMT BATTERY VOLTAGE: 3 V
MDC IDC MSMT LEADCHNL RA PACING THRESHOLD AMPLITUDE: 0.75 V
MDC IDC MSMT LEADCHNL RA SENSING INTR AMPL: 0.5 mV
MDC IDC MSMT LEADCHNL RV IMPEDANCE VALUE: 437 Ohm
MDC IDC PG IMPLANT DT: 20160620
MDC IDC SESS DTM: 20190729143655
MDC IDC SET LEADCHNL RA PACING AMPLITUDE: 1.5 V
MDC IDC SET LEADCHNL RV PACING AMPLITUDE: 2 V

## 2018-06-21 ENCOUNTER — Other Ambulatory Visit: Payer: Self-pay | Admitting: Family Medicine

## 2018-06-22 NOTE — Telephone Encounter (Signed)
Last OV 12/23/2017   Last refilled 12/23/2017 disp 60 with 5 refills   Sent to PCP to advise

## 2018-06-23 NOTE — Telephone Encounter (Signed)
Call in #[phone] with 5 rf 

## 2018-07-08 ENCOUNTER — Encounter: Payer: Self-pay | Admitting: Internal Medicine

## 2018-07-08 ENCOUNTER — Ambulatory Visit: Payer: Medicare Other | Admitting: Internal Medicine

## 2018-07-08 VITALS — BP 134/86 | HR 76 | Ht 62.0 in | Wt 136.8 lb

## 2018-07-08 DIAGNOSIS — I4821 Permanent atrial fibrillation: Secondary | ICD-10-CM

## 2018-07-08 DIAGNOSIS — I482 Chronic atrial fibrillation: Secondary | ICD-10-CM | POA: Diagnosis not present

## 2018-07-08 DIAGNOSIS — I495 Sick sinus syndrome: Secondary | ICD-10-CM

## 2018-07-08 DIAGNOSIS — I5032 Chronic diastolic (congestive) heart failure: Secondary | ICD-10-CM

## 2018-07-08 DIAGNOSIS — Z950 Presence of cardiac pacemaker: Secondary | ICD-10-CM

## 2018-07-08 LAB — CUP PACEART INCLINIC DEVICE CHECK
Battery Remaining Longevity: 64 mo
Battery Voltage: 3 V
Brady Statistic AP VP Percent: 12.21 %
Brady Statistic RA Percent Paced: 9.44 %
Brady Statistic RV Percent Paced: 99.31 %
Date Time Interrogation Session: 20190828171848
Implantable Lead Implant Date: 20160620
Implantable Lead Model: 5076
Implantable Lead Model: 5076
Implantable Pulse Generator Implant Date: 20160620
Lead Channel Impedance Value: 285 Ohm
Lead Channel Impedance Value: 437 Ohm
Lead Channel Pacing Threshold Pulse Width: 0.4 ms
Lead Channel Setting Pacing Amplitude: 2 V
Lead Channel Setting Pacing Pulse Width: 0.4 ms
Lead Channel Setting Sensing Sensitivity: 4 mV
MDC IDC LEAD IMPLANT DT: 20160620
MDC IDC LEAD LOCATION: 753859
MDC IDC LEAD LOCATION: 753860
MDC IDC MSMT LEADCHNL RA IMPEDANCE VALUE: 342 Ohm
MDC IDC MSMT LEADCHNL RV IMPEDANCE VALUE: 361 Ohm
MDC IDC MSMT LEADCHNL RV PACING THRESHOLD AMPLITUDE: 0.75 V
MDC IDC STAT BRADY AP VS PERCENT: 0.01 %
MDC IDC STAT BRADY AS VP PERCENT: 87.11 %
MDC IDC STAT BRADY AS VS PERCENT: 0.67 %

## 2018-07-08 MED ORDER — HYDRALAZINE HCL 50 MG PO TABS: 75.0000 mg | ORAL_TABLET | Freq: Two times a day (BID) | ORAL | 3 refills | Status: DC

## 2018-07-08 NOTE — Progress Notes (Signed)
Patient Care Team: [name], MD as PCP - General [name], MD as PCP - Cardiology (Cardiology)   HPI  [name] is a 84 y.o. female Seen in follow-up for pacemaker implanted 6/16 for syncope in the context of left bundle branch block sinus arrest and PAF.    The chart was reviewed. She started having problems with dyspnea a few months ago. She was seen in the emergency room on a number of occasions. She was noted in early May to be in atrial fibrillation; it is not clear from the notes that it was noted to be new. She then saw Dr. [name] who identified herself having significant volume overload. She underwent a 50 pound diuresis (12% body weight) with significant improvement and her breathing is now at baseline. Dr. Ellyn Hack increased her carvedilol. This seems to be coincidental with a striking reduction in ventricular response  The patient denies chest pain, shortness of breath, nocturnal dyspnea, orthopnea -- some peripheral edema.  There have been no palpitations, lightheadedness or syncope.        Records and Results Reviewed   Echo EF 55-60 2014 DATE TEST    4/18    Echo   EF 55 % LAE (42/2.5/50)   5/18    Myoview   EF 55-60 % No ischemia        Date Cr Hgb  5/18 1.44 9.8  6/18   12  2/19 1.96 12.4         Past Medical History:  Diagnosis Date  . Anemia   . Anxiety   . Arthritis   . Asthma   . Benign positional vertigo   . Cardiac pacemaker in situ 05/01/2015   for syncope & Sx Bradycardia -> Medtronic MRI compatible pacemaker placed Dr. Caryl Comes   . Chronic diarrhea   . Chronic kidney disease, stage IV (severe) ([place])   . Colon polyps   . Diastolic dysfunction, left ventricle 11/03/2013  . Diverticulosis   . Gout   . Hemorrhoids   . Hyperlipidemia   . Hypertensive heart disease    no significant RAS by MRA 2002- (<30% LRAS)   . Hypothyroidism   . IBS (irritable bowel syndrome)   . LBBB (left bundle branch block)- new 11/01/13  11/01/2013  . Paroxysmal atrial fibrillation 11/01/2013   Intermittent through the years and recurrent in December of 2014 treated with amiodarone;; Negative Myoview 10/2013  . Peripheral vascular disease    10-17% LICA, 5-10% RICA by doppler 2009   . Sinus arrest 04/28/2015  . Syncope and collapse 04/28/2015   s/p PPM  . Tubulovillous adenoma 4/07    Past Surgical History:  Procedure Laterality Date  . APPENDECTOMY    . CATARACT EXTRACTION    . EP IMPLANTABLE DEVICE N/A 05/01/2015   Procedure: Pacemaker Implant;  Surgeon: Deboraha Sprang, MD;  Location: [facility];  Service: Cardiovascular;  Laterality: N/A;  . HIP ARTHROPLASTY Left 05/01/2015   Procedure: ARTHROPLASTY BIPOLAR HIP (HEMIARTHROPLASTY);  Surgeon: Rod Can, MD;  Location: [facility];  Service: Orthopedics;  Laterality: Left;  . LOW ANTERIOR BOWEL RESECTION  7/08  . [place] SURGERY  2000  . NM MYOVIEW LTD  03/18/2013   Negative for ischemia or infarction. EF 55%.  LOW RISK  . OOPHORECTOMY  1962   right  . squamous cell skin cancer removed    . TRANSTHORACIC ECHOCARDIOGRAM  10/2013, 02/2017   A) EF 55-60%, mild LVH,  elevated bili pressures. Mild aortic valve calcification;; B) Normal LV size and function with mild LVH. Unable to assess diastolic function. Normal PA pressures. Mild MR,Mild AI    Current Outpatient Medications  Medication Sig Dispense Refill  . acetaminophen (TYLENOL) 325 MG tablet Take 2 tablets (650 mg total) by mouth every 6 (six) hours as needed for mild pain (or Fever >/= 101).    Marland Kitchen amLODipine (NORVASC) 5 MG tablet Take 1 tablet (5 mg total) by mouth daily. 90 tablet 3  . apixaban (ELIQUIS) 2.5 MG TABS tablet Take 1 tablet (2.5 mg total) by mouth 2 (two) times daily. 180 tablet 3  . bisacodyl (DULCOLAX) 5 MG EC tablet Take 1 tablet (5 mg total) by mouth daily as needed for moderate constipation. 30 tablet 0  . CVS VITAMIN B12 1000 MCG tablet TAKE 1 TABLET BY MOUTH EVERY DAY 90 tablet 3  .  febuxostat (ULORIC) 40 MG tablet Take 1 tablet by mouth every other day.    . fluticasone (FLOVENT HFA) 110 MCG/ACT inhaler Inhale 2 puffs into the lungs 2 (two) times daily. 1 Inhaler 12  . furosemide (LASIX) 40 MG tablet Take 0.5 tablets (20 mg total) by mouth daily as needed. Take 40mg  only if more than 2-3 pounds overnight and/or 5 pounds in a week 30 tablet 3  . hydrALAZINE (APRESOLINE) 50 MG tablet Take 1 tablet (50 mg total) by mouth 2 (two) times daily. 180 tablet 3  . Hyoscyamine Sulfate SL (LEVSIN/SL) 0.125 MG SUBL Take 1-2 tablets by mouth every 4 hours as needed 120 each 11  . levothyroxine (SYNTHROID, LEVOTHROID) 88 MCG tablet TAKE 1 TABLET (88 MCG TOTAL) BY MOUTH DAILY BEFORE BREAKFAST. 30 tablet 11  . LORazepam (ATIVAN) 0.5 MG tablet TAKE 1 TABLET BY MOUTH EVERY 8 HOURS AS NEEDED 90 tablet 5  . nitroGLYCERIN (NITROSTAT) 0.4 MG SL tablet Place 1 tablet (0.4 mg total) under the tongue every 5 (five) minutes x 3 doses as needed for chest pain. 25 tablet 3  . omeprazole (PRILOSEC) 20 MG capsule TAKE 1 CAPSULE BY MOUTH EVERY DAY 30 capsule 0  . Potassium Chloride ER 20 MEQ TBCR Take 20 mEq by mouth daily as needed. Take only if you are taking your Lasix that day. 90 tablet 3  . temazepam (RESTORIL) 15 MG capsule TAKE TWO CAPSULES BY MOUTH AT BEDTIME AS NEEDED FOR SLEEP 60 capsule 5   No current facility-administered medications for this visit.     Allergies  Allergen Reactions  . Penicillins Swelling    REACTION: nausea, swelling  . Xifaxan [Rifaximin] Other (See Comments) and Rash    unknown  . Ambien [Zolpidem Tartrate]     " made her crazy "  . Amoxicillin     REACTION: unspecified  . Ceftin [Cefuroxime Axetil] Diarrhea  . Codeine Phosphate     REACTION: unspecified  . Colchicine     Severe diarrhea  . Levofloxacin Other (See Comments)    Tingle numbness  . Lipitor [Atorvastatin] Other (See Comments)    "makes legs jump all night"      Review of Systems negative  except from HPI and PMH  Physical Exam BP 134/86   Pulse 76   Ht 5\' 2"  (1.575 m)   Wt 136 lb 12.8 oz (62.1 kg)   SpO2 96%   BMI 25.02 kg/m  Well developed and nourished in no acute distress HENT normal Neck supple with JVP-flat Clear Regular rate and rhythm, no murmurs or gallops  Abd-soft with active BS No Clubbing cyanosis 2+ edema Skin-warm and dry A & Oriented  Grossly normal sensory and motor function   ECG  V apcing Assessment and plan  Sinus node dysfunction  Pacemaker- Medtronic The patient's device was interrogated and the information was fully reviewed.  The device was reprogrammed [id number]  Hypertension  Edema  Anemia  Complete heart block  Atrial fibrillation-permanent   She has developed permanent atrial fibrillation.  We have reprogrammed her device to VVIR.  On Anticoagulation;  No bleeding issues   Her edema may be related to amlodipine; we will stop it.  We will increase her hydralazine from 50--75 twice daily for blood pressure management.  Anemia is much improved.  We will recheck her CBC today.

## 2018-07-08 NOTE — Patient Instructions (Signed)
Medication Instructions:  Your physician has recommended you make the following change in your medication:   1. Stop Amlodipine 2. Increase your Hydralazine to 75mg , one and half tablets, two times per day.  Labwork: You will have labs drawn today: CBC and BMP  Testing/Procedures: None ordered.  Follow-Up: Your physician wants you to follow-up in: One Year with [name]. You will receive a reminder letter in the mail two months in advance. If you don't receive a letter, please call our office to schedule the follow-up appointment.  Remote monitoring is used to monitor your Pacemaker of ICD from home. This monitoring reduces the number of office visits required to check your device to one time per year. It allows [place] to keep an eye on the functioning of your device to ensure it is working properly. You are scheduled for a device check from home on 10/28. You may send your transmission at any time that day. If you have a wireless device, the transmission will be sent automatically. After your physician reviews your transmission, you will receive a postcard with your next transmission date.     Any Other Special Instructions Will Be Listed Below (If Applicable).     If you need a refill on your cardiac medications before your next appointment, please call your pharmacy.

## 2018-07-09 LAB — CBC
HEMATOCRIT: 36.5 % (ref 34.0–46.6)
Hemoglobin: 11.9 g/dL (ref 11.1–15.9)
MCH: 31.4 pg (ref 26.6–33.0)
MCHC: 32.6 g/dL (ref 31.5–35.7)
MCV: 96 fL (ref 79–97)
PLATELETS: 189 10*3/uL (ref 150–450)
RBC: 3.79 x10E6/uL (ref 3.77–5.28)
RDW: 14.3 % (ref 12.3–15.4)
WBC: 4.5 10*3/uL (ref 3.4–10.8)

## 2018-07-09 LAB — BASIC METABOLIC PANEL
BUN / CREAT RATIO: 21 (ref 12–28)
BUN: 33 mg/dL (ref 10–36)
CALCIUM: 9.5 mg/dL (ref 8.7–10.3)
CHLORIDE: 102 mmol/L (ref 96–106)
CO2: 25 mmol/L (ref 20–29)
Creatinine, Ser: 1.6 mg/dL — ABNORMAL HIGH (ref 0.57–1.00)
GFR, EST AFRICAN AMERICAN: 32 mL/min/{1.73_m2} — AB (ref >59)
GFR, EST NON AFRICAN AMERICAN: 28 mL/min/{1.73_m2} — AB (ref >59)
Glucose: 93 mg/dL (ref 65–99)
POTASSIUM: 5 mmol/L (ref 3.5–5.2)
SODIUM: 143 mmol/L (ref 134–144)

## 2018-07-10 ENCOUNTER — Telehealth: Payer: Self-pay | Admitting: Cardiology

## 2018-07-10 NOTE — Telephone Encounter (Signed)
Pt called to be sure that Dr. Ellyn Hack agrees with the med change that Dr. Caryl Comes made yesterday with her BP med.. He had changed her Hydralazine and I reassured her that Dr. Ellyn Hack will review her chart and we will let her know if he has any further recommendations but she should go ahead and make the med change to help treat her BP and edema per Dr. Caryl Comes advice. Pt agrees and will let us know how she does on the med change.

## 2018-07-10 NOTE — Telephone Encounter (Signed)
New message  Per patient's daughter states that Dr. Caryl Comes took her off the amlodipine and changed the hydrALAZINE (APRESOLINE) 50 MG tablet  To 75 mg twice daily. Please call to discuss these changes in medications.

## 2018-07-10 NOTE — Telephone Encounter (Signed)
Left message to call back  

## 2018-07-13 NOTE — Telephone Encounter (Signed)
This is a reasonable change. It does get rid of one medication.  The hope is that it will help swelling by removing the potential cause (amlodipine).  Glenetta Hew, MD

## 2018-07-14 NOTE — Telephone Encounter (Signed)
PATIENT AWARE DR HARDING  COMMENTS IN REGARDS TO MEDICATION CHANGE.

## 2018-07-15 ENCOUNTER — Other Ambulatory Visit: Payer: Self-pay

## 2018-07-15 MED ORDER — HYOSCYAMINE SULFATE SL 0.125 MG SL SUBL: SUBLINGUAL_TABLET | SUBLINGUAL | 0 refills | Status: DC

## 2018-07-16 ENCOUNTER — Other Ambulatory Visit: Payer: Self-pay | Admitting: Gastroenterology

## 2018-07-26 ENCOUNTER — Other Ambulatory Visit: Payer: Self-pay | Admitting: Cardiology

## 2018-08-04 ENCOUNTER — Other Ambulatory Visit: Payer: Self-pay | Admitting: Gastroenterology

## 2018-08-08 ENCOUNTER — Other Ambulatory Visit: Payer: Self-pay | Admitting: Gastroenterology

## 2018-08-11 ENCOUNTER — Encounter: Payer: Self-pay | Admitting: Family Medicine

## 2018-08-11 ENCOUNTER — Ambulatory Visit: Payer: Medicare Other | Admitting: Family Medicine

## 2018-08-11 VITALS — BP 136/82 | HR 80 | Temp 98.1°F | Wt 127.4 lb

## 2018-08-11 DIAGNOSIS — J3089 Other allergic rhinitis: Secondary | ICD-10-CM | POA: Diagnosis not present

## 2018-08-11 MED ORDER — OMEPRAZOLE 20 MG PO CPDR: DELAYED_RELEASE_CAPSULE | ORAL | 3 refills | Status: DC

## 2018-08-11 MED ORDER — LEVOTHYROXINE SODIUM 88 MCG PO TABS: ORAL_TABLET | ORAL | 3 refills | Status: DC

## 2018-08-11 MED ORDER — FUROSEMIDE 40 MG PO TABS: 20.0000 mg | ORAL_TABLET | Freq: Every day | ORAL | 3 refills | Status: DC | PRN

## 2018-08-11 NOTE — Progress Notes (Signed)
   Subjective:    Patient ID: [name], female    DOB: 1924/04/17, 84 y.o.   MRN: [id number]  HPI Here for 2 weeks of stuffy head, popping in the left ear (no pain), dizziness, and mild nausea without vomiting. No fever or headache.    Review of Systems  Constitutional: Negative.   HENT: Positive for congestion, hearing loss, sinus pressure and sneezing. Negative for ear pain, postnasal drip, rhinorrhea, sinus pain and sore throat.   Eyes: Negative.   Respiratory: Negative.   Cardiovascular: Negative.   Neurological: Positive for dizziness. Negative for light-headedness and headaches.       Objective:   Physical Exam  Constitutional: She is oriented to person, place, and time. She appears well-developed and well-nourished.  HENT:  Right Ear: External ear normal.  Left Ear: External ear normal.  Nose: Nose normal.  Mouth/Throat: Oropharynx is clear and moist.  Eyes: Conjunctivae are normal.  Neck: No thyromegaly present.  Cardiovascular: Normal rate, normal heart sounds and intact distal pulses.  Irregular rhythm   Pulmonary/Chest: Effort normal and breath sounds normal.  Lymphadenopathy:    She has no cervical adenopathy.  Neurological: She is alert and oriented to person, place, and time. No cranial nerve deficit. She exhibits normal muscle tone. Coordination normal.          Assessment & Plan:  All her symptoms are likely related to sinus congestion from allergies. She will try Zyrtec 10 mg daily and Mucinex 1200 mg bid.recheck prn. [name], MD

## 2018-08-17 ENCOUNTER — Encounter: Payer: Self-pay | Admitting: Family Medicine

## 2018-08-17 NOTE — Telephone Encounter (Signed)
Dr. Fry please advise. Thanks  

## 2018-08-18 NOTE — Telephone Encounter (Signed)
Copied from [place] [id number]. Topic: General - Other >> Aug 18, 2018  1:47 PM Rutherford Nail, NT wrote: Reason for CRM: patient's daughter in law, Butch Penny, calling and is wanting to know what OTC medicaiton that the patient could take for nausea. States that the patient has not been out of bed for 2 days because she has been too nauseated when she stands up. States that the patient tells her that she does not feel like eating. States that she is not eating, but drinking fluids and eating ice. States that there is too much phlegm in her throat. [id number] [place], [place] CB#: [phone]

## 2018-08-20 NOTE — Telephone Encounter (Signed)
She can try Emetrol OTC. If that doesn't work we can send in a Rx for her

## 2018-09-01 ENCOUNTER — Ambulatory Visit (INDEPENDENT_AMBULATORY_CARE_PROVIDER_SITE_OTHER): Payer: Medicare Other | Admitting: *Deleted

## 2018-09-01 DIAGNOSIS — Z23 Encounter for immunization: Secondary | ICD-10-CM | POA: Diagnosis not present

## 2018-09-07 ENCOUNTER — Telehealth: Payer: Self-pay | Admitting: Cardiology

## 2018-09-07 ENCOUNTER — Ambulatory Visit (INDEPENDENT_AMBULATORY_CARE_PROVIDER_SITE_OTHER): Payer: Medicare Other | Admitting: *Deleted

## 2018-09-07 DIAGNOSIS — I442 Atrioventricular block, complete: Secondary | ICD-10-CM | POA: Diagnosis not present

## 2018-09-07 NOTE — Telephone Encounter (Signed)
LMOVM reminding pt to send remote transmission.   

## 2018-09-08 NOTE — Progress Notes (Signed)
Remote pacemaker transmission.   

## 2018-10-13 ENCOUNTER — Other Ambulatory Visit: Payer: Self-pay | Admitting: Gastroenterology

## 2018-10-17 ENCOUNTER — Other Ambulatory Visit: Payer: Self-pay | Admitting: Gastroenterology

## 2018-10-19 ENCOUNTER — Ambulatory Visit: Payer: Medicare Other | Admitting: Cardiology

## 2018-10-19 ENCOUNTER — Encounter: Payer: Self-pay | Admitting: Cardiology

## 2018-10-19 VITALS — BP 160/70 | HR 81 | Ht 62.0 in | Wt 128.2 lb

## 2018-10-19 DIAGNOSIS — I4821 Permanent atrial fibrillation: Secondary | ICD-10-CM

## 2018-10-19 DIAGNOSIS — F411 Generalized anxiety disorder: Secondary | ICD-10-CM

## 2018-10-19 DIAGNOSIS — R079 Chest pain, unspecified: Secondary | ICD-10-CM

## 2018-10-19 DIAGNOSIS — I5032 Chronic diastolic (congestive) heart failure: Secondary | ICD-10-CM

## 2018-10-19 DIAGNOSIS — I4819 Other persistent atrial fibrillation: Secondary | ICD-10-CM

## 2018-10-19 DIAGNOSIS — Z7901 Long term (current) use of anticoagulants: Secondary | ICD-10-CM | POA: Diagnosis not present

## 2018-10-19 DIAGNOSIS — Z950 Presence of cardiac pacemaker: Secondary | ICD-10-CM

## 2018-10-19 DIAGNOSIS — N184 Chronic kidney disease, stage 4 (severe): Secondary | ICD-10-CM

## 2018-10-19 MED ORDER — HYDRALAZINE HCL 50 MG PO TABS: 50.0000 mg | ORAL_TABLET | Freq: Two times a day (BID) | ORAL | 6 refills | Status: DC

## 2018-10-19 MED ORDER — ISOSORBIDE MONONITRATE ER 30 MG PO TB24: 15.0000 mg | ORAL_TABLET | Freq: Every day | ORAL | 3 refills | Status: DC

## 2018-10-19 MED ORDER — HYDRALAZINE HCL 25 MG PO TABS: 25.0000 mg | ORAL_TABLET | Freq: Two times a day (BID) | ORAL | 3 refills | Status: DC

## 2018-10-19 NOTE — Progress Notes (Signed)
10/19/2018 [name]   [birth year]  [id number]  Primary Physician Lindsey Morale, MD Primary Cardiologist: [name]- [name]  HPI:  84y/o female with a history of AF. She had been on Amiodarone in the past but had syncope and heart block in June 2016.  Her syncope resulted a fractured Lt hip. In addition to heart block she also had some retinopathy attributed to Amiodarone and this was stopped.  She ultimately underwent MDT pacemaker implant followed by Lt hip surgery 05/01/15.  [name] has followed her pacemaker and [name] has been her general cardiologist.  Echo in April 2018 showed normal LVF, mild LVH, and severe LAE.  Lexiscan in May 2018 was low risk.  She is now in CAF.  This past Spring she had issues with diastolic CHF.  She has stage 4 CRI and is followed by [name].  Her LOV was with [name] in August.  He stopped her Amlodipine secondary to LE edema.  To compensate he increased her Hydralazine to 75 mg BID.  He notes her pacer has been reprogrammed to VVI R.   The is in the office today accompanied by her daughter for follow-up.  She has a list of some complaints and concerns.  Overall she has a feeling that she is not quite as strong as she used to be.  She has occasional palpitations but no sustained tachycardia.  She feels like she is just not doing quite as well since Lindsey. Caryl Scott reprogrammed her pacemaker.  The lower extremity edema she had on amlodipine has resolved.  She has a vague sensation of dyspnea and chest pressure at night.  She admits she has issues with anxiety, this is been worse since her son recently had bypass grafting.  She also mentioned that her weight has been drifting down, although in reviewing her office weights is not far off her baseline.   Current Outpatient Medications  Medication Sig Dispense Refill  . apixaban (ELIQUIS) 2.5 MG TABS tablet Take 1 tablet (2.5 mg total) by mouth 2 (two) times daily. 180 tablet 3  . cetirizine (ZYRTEC) 10  MG tablet Take 10 mg by mouth daily.    . CVS VITAMIN B12 1000 MCG tablet TAKE 1 TABLET BY MOUTH EVERY DAY 90 tablet 3  . febuxostat (ULORIC) 40 MG tablet Take 1 tablet by mouth every other day. TAKING EVERY OTHER DAY TO EVERY THIRD DAY    . fluticasone (FLOVENT HFA) 110 MCG/ACT inhaler Inhale 2 puffs into the lungs 2 (two) times daily. (Patient taking differently: Inhale 2 puffs into the lungs 2 (two) times daily. PATIENT STATES TAKING AS NEEDED) 1 Inhaler 12  . guaiFENesin (MUCINEX) 600 MG 12 hr tablet Take 1,200 mg by mouth 2 (two) times daily.    . hydrALAZINE (APRESOLINE) 50 MG tablet Take 1 tablet (50 mg total) by mouth 2 (two) times daily. TAKE THIS 50 MG DOSE ALONG WITH YOUR 25 MG DOSE (75 MG TOTAL) TWICE DAILY. 90 tablet 6  . Hyoscyamine Sulfate SL (LEVSIN/SL) 0.125 MG SUBL Take 1-2 tablets by mouth every 4 hours as needed 120 each 0  . levothyroxine (SYNTHROID, LEVOTHROID) 88 MCG tablet TAKE 1 TABLET (88 MCG TOTAL) BY MOUTH DAILY BEFORE BREAKFAST. 90 tablet 3  . loperamide (IMODIUM) 2 MG capsule Take 2 mg by mouth as needed for diarrhea or loose stools.    Marland Kitchen omeprazole (PRILOSEC) 20 MG capsule TAKE 1 CAPSULE BY MOUTH EVERY DAY 90 capsule 3  .  temazepam (RESTORIL) 15 MG capsule TAKE TWO CAPSULES BY MOUTH AT BEDTIME AS NEEDED FOR SLEEP 60 capsule 5  . acetaminophen (TYLENOL) 325 MG tablet Take 2 tablets (650 mg total) by mouth every 6 (six) hours as needed for mild pain (or Fever >/= 101).    . bisacodyl (DULCOLAX) 5 MG EC tablet Take 1 tablet (5 mg total) by mouth daily as needed for moderate constipation. 30 tablet 0  . furosemide (LASIX) 40 MG tablet Take 0.5 tablets (20 mg total) by mouth daily as needed. Take 40mg  only if more than 2-3 pounds overnight and/or 5 pounds in a week (Patient not taking: Reported on 10/19/2018) 90 tablet 3  . hydrALAZINE (APRESOLINE) 25 MG tablet Take 1 tablet (25 mg total) by mouth 2 (two) times daily. TAKE THIS 25 MG DOSE ALONG WITH YOUR 50 MG DOSE (75 MG  TOTAL) TWICE DAILY. 270 tablet 3  . isosorbide mononitrate (IMDUR) 30 MG 24 hr tablet Take 0.5 tablets (15 mg total) by mouth daily at 6 PM. 45 tablet 3  . LORazepam (ATIVAN) 0.5 MG tablet TAKE 1 TABLET BY MOUTH EVERY 8 HOURS AS NEEDED 90 tablet 5  . nitroGLYCERIN (NITROSTAT) 0.4 MG SL tablet Place 1 tablet (0.4 mg total) under the tongue every 5 (five) minutes x 3 doses as needed for chest pain. 25 tablet 3  . Potassium Chloride ER 20 MEQ TBCR Take 20 mEq by mouth daily as needed. Take only if you are taking your Lasix that day. 90 tablet 3   No current facility-administered medications for this visit.     Allergies  Allergen Reactions  . Penicillins Swelling    REACTION: nausea, swelling  . Xifaxan [Rifaximin] Other (See Comments) and Rash    unknown  . Ambien [Zolpidem Tartrate]     " made her crazy "  . Amoxicillin     REACTION: unspecified  . Ceftin [Cefuroxime Axetil] Diarrhea  . Codeine Phosphate     REACTION: unspecified  . Colchicine     Severe diarrhea  . Levofloxacin Other (See Comments)    Tingle numbness  . Lipitor [Atorvastatin] Other (See Comments)    "makes legs jump all night"  . Amlodipine Other (See Comments)    LE edema    Past Medical History:  Diagnosis Date  . Anemia   . Anxiety   . Arthritis   . Asthma   . Benign positional vertigo   . Cardiac pacemaker in situ 05/01/2015   for syncope & Sx Bradycardia -> Medtronic MRI compatible pacemaker placed Lindsey. Caryl Scott   . Chronic diarrhea   . Chronic kidney disease, stage IV (severe) ([place])   . Colon polyps   . Diastolic dysfunction, left ventricle 11/03/2013  . Diverticulosis   . Gout   . Hemorrhoids   . Hyperlipidemia   . Hypertensive heart disease    no significant RAS by MRA 2002- (<30% LRAS)   . Hypothyroidism   . IBS (irritable bowel syndrome)   . LBBB (left bundle branch block)- new 11/01/13 11/01/2013  . Paroxysmal atrial fibrillation 11/01/2013   Intermittent through the years and recurrent  in December of 2014 treated with amiodarone;; Negative Myoview 10/2013  . Peripheral vascular disease    45-62% LICA, 5-63% RICA by doppler 2009   . Sinus arrest 04/28/2015  . Syncope and collapse 04/28/2015   s/p PPM  . Tubulovillous adenoma 4/07    Social History   Socioeconomic History  . Marital status: Widowed    Spouse  name: Not on file  . Number of children: 2  . Years of education: Not on file  . Highest education level: Not on file  Occupational History  . Occupation: retired  Scientific laboratory technician  . Financial resource strain: Not on file  . Food insecurity:    Worry: Not on file    Inability: Not on file  . Transportation needs:    Medical: Not on file    Non-medical: Not on file  Tobacco Use  . Smoking status: Never Smoker  . Smokeless tobacco: Never Used  . Tobacco comment: never used tobacco  Substance and Sexual Activity  . Alcohol use: No    Alcohol/week: 0.0 standard drinks  . Drug use: No  . Sexual activity: Never  Lifestyle  . Physical activity:    Days per week: Not on file    Minutes per session: Not on file  . Stress: Not on file  Relationships  . Social connections:    Talks on phone: Not on file    Gets together: Not on file    Attends religious service: Not on file    Active member of club or organization: Not on file    Attends meetings of clubs or organizations: Not on file    Relationship status: Not on file  . Intimate partner violence:    Fear of current or ex partner: Not on file    Emotionally abused: Not on file    Physically abused: Not on file    Forced sexual activity: Not on file  Other Topics Concern  . Not on file  Social History Narrative   She is a widowed mother of 2, one child is deceased. Grandmother of one. She appears to be working out routinely at [employer] using [employer]. She is otherwise quite active.   She does not drink alcohol, does not smoke.  She is retired from [employer].    Lives alone  in a one story condo.     Family History  Problem Relation Age of Onset  . Cancer Mother 86       unknown  . Heart attack Father   . Heart disease Brother   . Stroke Maternal Grandmother   . Lung cancer Maternal Grandfather   . Stroke Paternal Grandmother   . Colon cancer Daughter      Review of Systems: General: negative for chills, fever, night sweats  Cardiovascular: negative for chest pain, dyspnea on exertion, edema, orthopnea, paroxysmal nocturnal dyspnea  Dermatological: negative for rash Respiratory: negative for cough or wheezing Urologic: negative for hematuria Abdominal: negative for nausea, vomiting, diarrhea, bright red blood per rectum, melena, or hematemesis Neurologic: negative for visual changes, syncope, or dizziness All other systems reviewed and are otherwise negative except as noted above.    Blood pressure (!) 160/70, pulse 81, height 5\' 2"  (1.575 m), weight 128 lb 3.2 oz (58.2 kg).  General appearance: alert, cooperative, no distress and distorted pupil OD Neck: no JVD Lungs: clear to auscultation bilaterally Heart: irregularly irregular rhythm Extremities: no edema Skin: warm and dry  EKG AF, V paced-81  ASSESSMENT AND PLAN:   Chest tightness Patient has had some vague chest tightness in the evening.  She does not appear to be in heart failure and in fact her weight is down from her baseline.  Its possible this is angina but after discussion with the patient and her daughter will try medical therapy for now and not proceed with  any work-up.  Chronic diastolic heart failure She appears to be doing pretty well- her wgt is down a little from her baseline  Persistent Paroxysmal atrial fibrillation: CHA2DS2-VASc Score 5, on Eliquis CAF since March 2018- severe LAE on echo and past Amiodarone intolerance- no plans for cardioversion.  Chronic anticoagulation- Eliquis CHADs VASc = 4-on low dose Eliquis  CKD (chronic kidney disease), stage IV  (HCC) Followed by [name]  Cardiac pacemaker in situ MDT PTVDP May 01 2015  Anxiety Chronic, worse since her son has been ill.   PLAN:  the patient states she feels like she is just not doing as well as she had been since her pacemaker is been reprogrammed.  I will asked Lindsey. Caryl Scott about this but I am not sure much can be done about it.  They were concerned about her chest tightness, we had a long discussion about what a work-up would entail and the risk of doing any kind of invasive work-up.  At this time we will just add a low-dose nitrate and see if that helps.  She will follow-up with Lindsey. Ellyn Scott in 6 months.  Kerin Ransom PA-C 10/19/2018 12:53 PM

## 2018-10-19 NOTE — Patient Instructions (Signed)
Medication Instructions:  Your physician has recommended you make the following change in your medication:   START IMDUR 15 MG, HALF A TABLET DAILY. TAKE AT 6PM.   If you need a refill on your cardiac medications before your next appointment, please call your pharmacy.   Lab work: NONE If you have labs (blood work) drawn today and your tests are completely normal, you will receive your results only by: Marland Kitchen MyChart Message (if you have MyChart) OR . A paper copy in the mail If you have any lab test that is abnormal or we need to change your treatment, we will call you to review the results.  Testing/Procedures: NONE  Follow-Up: At [facility], you and your health needs are our priority.  As part of our continuing mission to provide you with exceptional heart care, we have created designated Provider Care Teams.  These Care Teams include your primary Cardiologist (physician) and Advanced Practice Providers (APPs -  Physician Assistants and Nurse Practitioners) who all work together to provide you with the care you need, when you need it. You will need a follow up appointment in 6 months WITH DR. HARDING.  Please call our office 2 months in advance to schedule this appointment.

## 2018-10-22 ENCOUNTER — Telehealth: Payer: Self-pay | Admitting: Gastroenterology

## 2018-10-22 MED ORDER — HYOSCYAMINE SULFATE SL 0.125 MG SL SUBL: SUBLINGUAL_TABLET | SUBLINGUAL | 0 refills | Status: DC

## 2018-10-22 NOTE — Telephone Encounter (Signed)
Left a message for patient to return my call. 

## 2018-10-22 NOTE — Telephone Encounter (Signed)
Appt scheduled for January and prescription sent to patient's pharmacy.

## 2018-11-02 ENCOUNTER — Ambulatory Visit: Payer: Medicare Other | Admitting: Family Medicine

## 2018-11-02 ENCOUNTER — Encounter: Payer: Self-pay | Admitting: Family Medicine

## 2018-11-02 VITALS — BP 156/80 | HR 85 | Temp 98.1°F | Wt 132.1 lb

## 2018-11-02 DIAGNOSIS — J069 Acute upper respiratory infection, unspecified: Secondary | ICD-10-CM

## 2018-11-02 DIAGNOSIS — K219 Gastro-esophageal reflux disease without esophagitis: Secondary | ICD-10-CM

## 2018-11-02 LAB — CUP PACEART REMOTE DEVICE CHECK
Battery Remaining Longevity: 80 mo
Battery Voltage: 3.01 V
Brady Statistic AP VP Percent: 2.1 %
Brady Statistic AP VS Percent: 0 %
Brady Statistic AS VP Percent: 97.9 %
Brady Statistic AS VS Percent: 0 %
Brady Statistic RA Percent Paced: 1.51 %
Date Time Interrogation Session: 20191028191551
Implantable Lead Implant Date: 20160620
Implantable Lead Location: 753859
Implantable Lead Location: 753860
Implantable Lead Model: 5076
Implantable Lead Model: 5076
Implantable Pulse Generator Implant Date: 20160620
Lead Channel Impedance Value: 285 Ohm
Lead Channel Impedance Value: 342 Ohm
Lead Channel Impedance Value: 361 Ohm
Lead Channel Pacing Threshold Amplitude: 0.625 V
Lead Channel Pacing Threshold Pulse Width: 0.4 ms
Lead Channel Sensing Intrinsic Amplitude: 0.375 mV
Lead Channel Sensing Intrinsic Amplitude: 0.75 mV
Lead Channel Sensing Intrinsic Amplitude: 16.375 mV
Lead Channel Sensing Intrinsic Amplitude: 16.375 mV
Lead Channel Setting Pacing Amplitude: 2 V
Lead Channel Setting Pacing Pulse Width: 0.4 ms
Lead Channel Setting Sensing Sensitivity: 4 mV
MDC IDC LEAD IMPLANT DT: 20160620
MDC IDC MSMT LEADCHNL RA PACING THRESHOLD AMPLITUDE: 0.75 V
MDC IDC MSMT LEADCHNL RA PACING THRESHOLD PULSEWIDTH: 0.4 ms
MDC IDC MSMT LEADCHNL RV IMPEDANCE VALUE: 456 Ohm
MDC IDC STAT BRADY RV PERCENT PACED: 99.6 %

## 2018-11-02 MED ORDER — OMEPRAZOLE 40 MG PO CPDR: 40.0000 mg | DELAYED_RELEASE_CAPSULE | Freq: Every day | ORAL | 3 refills | Status: DC

## 2018-11-02 NOTE — Progress Notes (Signed)
   Subjective:    Patient ID: [name], female    DOB: January 25, 1924, 84 y.o.   MRN: [id number]  HPI Here for several days of mild body aches and a ST. No fever or cough. Also she has had intermittent upper abdominal pains and nausea without vomiting for a few weeks. She has been taking Omeprazole 20 mg daily.    Review of Systems  Constitutional: Negative.   HENT: Positive for postnasal drip and sore throat. Negative for sinus pressure and sinus pain.   Eyes: Negative.   Respiratory: Negative.   Cardiovascular: Negative.   Gastrointestinal: Positive for abdominal pain and nausea. Negative for blood in stool, constipation, diarrhea and vomiting.       Objective:   Physical Exam Constitutional:      Appearance: Normal appearance.  HENT:     Right Ear: Tympanic membrane and ear canal normal.     Left Ear: Tympanic membrane and ear canal normal.     Nose: Nose normal.     Mouth/Throat:     Pharynx: Oropharynx is clear.  Eyes:     Conjunctiva/sclera: Conjunctivae normal.  Neck:     Musculoskeletal: Normal range of motion.  Cardiovascular:     Rate and Rhythm: Normal rate and regular rhythm.     Pulses: Normal pulses.     Heart sounds: Normal heart sounds.  Pulmonary:     Effort: Pulmonary effort is normal. No respiratory distress.     Breath sounds: Normal breath sounds. No stridor. No wheezing or rhonchi.  Abdominal:     General: Abdomen is flat. Bowel sounds are normal. There is no distension.     Palpations: Abdomen is soft. There is no mass.     Tenderness: There is no abdominal tenderness. There is no guarding or rebound.     Hernia: No hernia is present.  Lymphadenopathy:     Cervical: No cervical adenopathy.  Neurological:     Mental Status: She is alert.           Assessment & Plan:  She has viral URI, and she can drink fluids and take Tylenol prn. Also her GERD has worsened a bit, so she will increase the Omeprazole to 40 mg daily.  [name], MD

## 2018-11-16 ENCOUNTER — Other Ambulatory Visit: Payer: Self-pay | Admitting: Gastroenterology

## 2018-11-24 ENCOUNTER — Ambulatory Visit: Payer: Medicare Other | Admitting: Family Medicine

## 2018-11-24 ENCOUNTER — Encounter: Payer: Self-pay | Admitting: Family Medicine

## 2018-11-24 VITALS — BP 152/82 | HR 78 | Temp 98.7°F | Wt 124.1 lb

## 2018-11-24 DIAGNOSIS — J209 Acute bronchitis, unspecified: Secondary | ICD-10-CM

## 2018-11-24 MED ORDER — DOXYCYCLINE HYCLATE 100 MG PO CAPS: 100.0000 mg | ORAL_CAPSULE | Freq: Two times a day (BID) | ORAL | 0 refills | Status: AC

## 2018-11-24 NOTE — Progress Notes (Signed)
   Subjective:    Patient ID: [name], female    DOB: 10-Jan-1924, 85 y.o.   MRN: [id number]  HPI Here for one week of PND, chest congestion and coughing up yellow sputum. Some low grade fevers. Using Delsym.    Review of Systems  Constitutional: Negative.   HENT: Positive for congestion and postnasal drip. Negative for sinus pressure, sinus pain and sore throat.   Eyes: Negative.   Respiratory: Positive for cough and chest tightness. Negative for shortness of breath and wheezing.        Objective:   Physical Exam Constitutional:      Appearance: Normal appearance.  HENT:     Right Ear: Tympanic membrane and ear canal normal.     Left Ear: Tympanic membrane and ear canal normal.     Nose: Nose normal.     Mouth/Throat:     Pharynx: Oropharynx is clear.  Eyes:     Conjunctiva/sclera: Conjunctivae normal.  Pulmonary:     Effort: Pulmonary effort is normal. No respiratory distress.     Breath sounds: Normal breath sounds. No wheezing or rales.     Comments: Scattered rhonchi  Neurological:     Mental Status: She is alert.           Assessment & Plan:  Bronchitis, treat with Doxycycline. [name], MD

## 2018-11-26 ENCOUNTER — Ambulatory Visit: Payer: Medicare Other | Admitting: Gastroenterology

## 2018-11-27 ENCOUNTER — Ambulatory Visit: Payer: Medicare Other | Admitting: Gastroenterology

## 2018-11-27 ENCOUNTER — Encounter: Payer: Self-pay | Admitting: Gastroenterology

## 2018-11-27 VITALS — BP 113/64 | HR 69 | Ht 63.0 in | Wt 120.6 lb

## 2018-11-27 DIAGNOSIS — K2100 Gastro-esophageal reflux disease with esophagitis, without bleeding: Secondary | ICD-10-CM

## 2018-11-27 DIAGNOSIS — R0989 Other specified symptoms and signs involving the circulatory and respiratory systems: Secondary | ICD-10-CM | POA: Diagnosis not present

## 2018-11-27 DIAGNOSIS — R634 Abnormal weight loss: Secondary | ICD-10-CM | POA: Diagnosis not present

## 2018-11-27 DIAGNOSIS — R1310 Dysphagia, unspecified: Secondary | ICD-10-CM

## 2018-11-27 MED ORDER — HYOSCYAMINE SULFATE 0.125 MG SL SUBL: SUBLINGUAL_TABLET | SUBLINGUAL | 11 refills | Status: DC

## 2018-11-27 MED ORDER — OMEPRAZOLE 40 MG PO CPDR: 40.0000 mg | DELAYED_RELEASE_CAPSULE | Freq: Two times a day (BID) | ORAL | 11 refills | Status: DC

## 2018-11-27 NOTE — Patient Instructions (Addendum)
We have increase your omeprazole 40 mg to twice daily. A new prescription has been sent to your pharmacy.   We have also sent in your prescription for Levsin to your pharmacy.   You have been scheduled for a Barium Esophogram at [facility] (1st floor of the hospital) on 12/03/18 at 11:00am. Please arrive 15 minutes prior to your appointment for registration. Make certain not to have anything to eat or drink 3 hours prior to your test. If you need to reschedule for any reason, please contact radiology at [phone] to do so. __________________________________________________________________ A barium swallow is an examination that concentrates on views of the esophagus. This tends to be a double contrast exam (barium and two liquids which, when combined, create a gas to distend the wall of the oesophagus) or single contrast (non-ionic iodine based). The study is usually tailored to your symptoms so a good history is essential. Attention is paid during the study to the form, structure and configuration of the esophagus, looking for functional disorders (such as aspiration, dysphagia, achalasia, motility and reflux) EXAMINATION You may be asked to change into a gown, depending on the type of swallow being performed. A radiologist and radiographer will perform the procedure. The radiologist will advise you of the type of contrast selected for your procedure and direct you during the exam. You will be asked to stand, sit or lie in several different positions and to hold a small amount of fluid in your mouth before being asked to swallow while the imaging is performed .In some instances you may be asked to swallow barium coated marshmallows to assess the motility of a solid food bolus. The exam can be recorded as a digital or video fluoroscopy procedure. POST PROCEDURE It will take 1-2 days for the barium to pass through your system. To facilitate this, it is important, unless otherwise directed, to  increase your fluids for the next 24-48hrs and to resume your normal diet.  This test typically takes about 30 minutes to perform. __________________________________________________________________  Thank you for choosing me and [facility].  Pricilla Riffle. Dagoberto Ligas., MD., [name]

## 2018-11-27 NOTE — Progress Notes (Signed)
    History of Present Illness: This is a 85 year old female with IBS, GERD, Cameron erosions. She has bronchitis, sinus drainage, globus, more active reflux symptoms and possible solid food dysphagia over the past several weeks.  She states her irritable bowel syndrome is unchanged with frequent bloating, generalized abdominal discomfort and alternating diarrhea and constipation.  She relates a 10 to 15 pound weight loss due to decreased appetite over the past several weeks associated with her other symptoms.  Current Medications, Allergies, Past Medical History, Past Surgical History, Family History and Social History were reviewed in [employer] record.  Physical Exam: General: Well developed, well nourished, no acute distress Head: Normocephalic and atraumatic Eyes:  sclerae anicteric, EOMI Ears: Normal auditory acuity Mouth: No deformity or lesions Lungs: Clear throughout to auscultation Heart: Regular rate and rhythm; no murmurs, rubs or bruits Abdomen: Soft, non tender and non distended. No masses, hepatosplenomegaly or hernias noted. Normal Bowel sounds Rectal: Not done Musculoskeletal: Symmetrical with no gross deformities  Pulses:  Normal pulses noted Extremities: No clubbing, cyanosis, edema or deformities noted Neurological: Alert oriented x 4, grossly nonfocal Psychological:  Alert and cooperative. Normal mood and affect   Assessment and Recommendations:  1. IBS, alternating pattern, unchanged. Continue Levsin 0.125 mg 1-2 po q4h. Encouraged to use more frequently. Imodium tid prn.   2. GERD with esophagitis and a history of an esophageal stricture. More active reflux symptoms. Possible dysphagia. R/O stricture.  Increase omeprazole to 40 mg twice daily.  Schedule barium esophagram.  If a stricture is noted or if GI symptoms are not improved schedule EGD.  3. Cameron erosions, moderate sized hiatal hernia.   4. Bronchitis and sinus drainage with  congestion, mucus, globus sensation.  Per Dr. Sarajane Jews.  5.  Weight loss.

## 2018-12-02 ENCOUNTER — Telehealth: Payer: Self-pay | Admitting: Gastroenterology

## 2018-12-02 NOTE — Telephone Encounter (Signed)
PT is having concern about procedure tomorrow cant eat having diarrhea and losing weight. If you could call at [phone]. JG

## 2018-12-02 NOTE — Telephone Encounter (Signed)
Patient is encouraged to keep her appt for barium swallow tomorrow. She is advised that we will call with results and recommendations as as possible.

## 2018-12-03 ENCOUNTER — Telehealth: Payer: Self-pay

## 2018-12-03 ENCOUNTER — Ambulatory Visit (HOSPITAL_COMMUNITY)
Admission: RE | Admit: 2018-12-03 | Discharge: 2018-12-03 | Disposition: A | Discharge status: Home / Self Care | Payer: Medicare Other | Source: Ambulatory Visit | Attending: Gastroenterology | Admitting: Gastroenterology

## 2018-12-03 DIAGNOSIS — R0989 Other specified symptoms and signs involving the circulatory and respiratory systems: Secondary | ICD-10-CM | POA: Diagnosis present

## 2018-12-03 DIAGNOSIS — K2100 Gastro-esophageal reflux disease with esophagitis, without bleeding: Secondary | ICD-10-CM

## 2018-12-03 DIAGNOSIS — R1310 Dysphagia, unspecified: Secondary | ICD-10-CM | POA: Diagnosis present

## 2018-12-03 NOTE — Telephone Encounter (Signed)
 [facility] Pre-operative Risk Assessment     Request for surgical clearance:     Endoscopy Procedure  What type of surgery is being performed?     EGD with esophageal dilation  When is this surgery scheduled?     12/16/18  What type of clearance is required ?   Pharmacy  Are there any medications that need to be held prior to surgery and how long? Eliquis  Practice name and name of physician performing surgery?      [facility]  What is your office phone and fax number?      Phone- [phone]  [phone]  Anesthesia type (None, local, MAC, general) ?       MAC 2

## 2018-12-03 NOTE — Telephone Encounter (Signed)
Patient with diagnosis of Afib on Eliquis for anticoagulation.    Procedure: EGD with dilation Date of procedure: 12/16/18  CHADS2-VASc score of  5 (CHF, HTN, AGE, DM2, stroke/tia x 2, CAD, AGE, female)  CrCl 39ml/min  Per office protocol, patient can hold Eliquis for 3 days prior to procedure.

## 2018-12-04 ENCOUNTER — Ambulatory Visit (AMBULATORY_SURGERY_CENTER): Payer: Self-pay

## 2018-12-04 ENCOUNTER — Encounter: Payer: Self-pay | Admitting: Gastroenterology

## 2018-12-04 ENCOUNTER — Telehealth: Payer: Self-pay | Admitting: Gastroenterology

## 2018-12-04 VITALS — Ht 62.0 in | Wt 118.0 lb

## 2018-12-04 DIAGNOSIS — R1310 Dysphagia, unspecified: Secondary | ICD-10-CM

## 2018-12-04 NOTE — Telephone Encounter (Signed)
Patients daughter states pt has lost 20lbs in the last month and wants to know if there is anything she can do to help her.

## 2018-12-04 NOTE — Progress Notes (Signed)
Denies allergies to eggs or soy products. Denies complication of anesthesia or sedation. Denies use of weight loss medication. Denies use of O2.   Emmi instructions declined.  

## 2018-12-04 NOTE — Telephone Encounter (Signed)
I spoke with the patient's daughter.  I have been trying to reach them about results and recommendations for EGD off Eliquis for 2 days per Dr. Fuller Plan.  She will come in today at 3:30 and EGD on Wed 12/09/18 10:00

## 2018-12-07 ENCOUNTER — Ambulatory Visit (INDEPENDENT_AMBULATORY_CARE_PROVIDER_SITE_OTHER): Payer: Medicare Other

## 2018-12-07 DIAGNOSIS — I442 Atrioventricular block, complete: Secondary | ICD-10-CM | POA: Diagnosis not present

## 2018-12-07 DIAGNOSIS — I495 Sick sinus syndrome: Secondary | ICD-10-CM

## 2018-12-08 LAB — CUP PACEART REMOTE DEVICE CHECK
Battery Voltage: 3.01 V
Brady Statistic AP VP Percent: 0 %
Brady Statistic AP VS Percent: 0 %
Brady Statistic AS VP Percent: 0 %
Brady Statistic RA Percent Paced: 0 %
Brady Statistic RV Percent Paced: 96.65 %
Implantable Lead Implant Date: 20160620
Implantable Lead Implant Date: 20160620
Implantable Lead Location: 753859
Implantable Lead Location: 753860
Implantable Lead Model: 5076
Implantable Lead Model: 5076
Lead Channel Impedance Value: 285 Ohm
Lead Channel Impedance Value: 342 Ohm
Lead Channel Impedance Value: 342 Ohm
Lead Channel Impedance Value: 437 Ohm
Lead Channel Pacing Threshold Amplitude: 0.75 V
Lead Channel Pacing Threshold Pulse Width: 0.4 ms
Lead Channel Pacing Threshold Pulse Width: 0.4 ms
Lead Channel Sensing Intrinsic Amplitude: 0.375 mV
Lead Channel Sensing Intrinsic Amplitude: 20.125 mV
Lead Channel Sensing Intrinsic Amplitude: 20.125 mV
Lead Channel Setting Pacing Amplitude: 2 V
Lead Channel Setting Pacing Pulse Width: 0.4 ms
Lead Channel Setting Sensing Sensitivity: 4 mV
MDC IDC MSMT BATTERY REMAINING LONGEVITY: 74 mo
MDC IDC MSMT LEADCHNL RA PACING THRESHOLD AMPLITUDE: 0.75 V
MDC IDC MSMT LEADCHNL RA SENSING INTR AMPL: 0.75 mV
MDC IDC PG IMPLANT DT: 20160620
MDC IDC SESS DTM: 20200127174613
MDC IDC STAT BRADY AS VS PERCENT: 0 %

## 2018-12-08 NOTE — Progress Notes (Signed)
Remote pacemaker transmission.   

## 2018-12-08 NOTE — Telephone Encounter (Signed)
   Primary Cardiologist: Glenetta Hew, MD  Chart reviewed as part of pre-operative protocol coverage.   Per pharmacy recommendations, patient may hold eliquis for 3 days prior to upcoming endoscopic procedure. Eliquis should be restarted as soon as she is cleared to do so by her gastroenterologist.   I will route this recommendation to the requesting party via [facility] fax function and remove from pre-op pool.  Please call with questions.  Abigail Butts, PA-C 12/08/2018, 9:40 AM

## 2018-12-09 ENCOUNTER — Ambulatory Visit (AMBULATORY_SURGERY_CENTER): Payer: Medicare Other | Admitting: Gastroenterology

## 2018-12-09 ENCOUNTER — Encounter: Payer: Self-pay | Admitting: Gastroenterology

## 2018-12-09 VITALS — BP 164/74 | HR 62 | Temp 97.3°F | Resp 14 | Ht 62.0 in | Wt 118.0 lb

## 2018-12-09 DIAGNOSIS — K222 Esophageal obstruction: Secondary | ICD-10-CM

## 2018-12-09 DIAGNOSIS — R1310 Dysphagia, unspecified: Secondary | ICD-10-CM | POA: Diagnosis not present

## 2018-12-09 DIAGNOSIS — R1319 Other dysphagia: Secondary | ICD-10-CM

## 2018-12-09 MED ORDER — SODIUM CHLORIDE 0.9 % IV SOLN: 500.0000 mL | Freq: Once | INTRAVENOUS | Status: DC

## 2018-12-09 NOTE — Progress Notes (Signed)
PT taken to PACU. Monitors in place. VSS. Report given to RN. 

## 2018-12-09 NOTE — Patient Instructions (Signed)
YOU HAD AN ENDOSCOPIC PROCEDURE TODAY AT [facility]:   Refer to the procedure report that was given to you for any specific questions about what was found during the examination.  If the procedure report does not answer your questions, please call your gastroenterologist to clarify.  If you requested that your care partner not be given the details of your procedure findings, then the procedure report has been included in a sealed envelope for you to review at your convenience later.  YOU SHOULD EXPECT: Some feelings of bloating in the abdomen. Passage of more gas than usual.  Walking can help get rid of the air that was put into your GI tract during the procedure and reduce the bloating. If you had a lower endoscopy (such as a colonoscopy or flexible sigmoidoscopy) you may notice spotting of blood in your stool or on the toilet paper. If you underwent a bowel prep for your procedure, you may not have a normal bowel movement for a few days.  Please Note:  You might notice some irritation and congestion in your nose or some drainage.  This is from the oxygen used during your procedure.  There is no need for concern and it should clear up in a day or so.  SYMPTOMS TO REPORT IMMEDIATELY:    Following upper endoscopy (EGD)  Vomiting of blood or coffee ground material  New chest pain or pain under the shoulder blades  Painful or persistently difficult swallowing  New shortness of breath  Fever of 100F or higher  Black, tarry-looking stools  For urgent or emergent issues, a gastroenterologist can be reached at any hour by calling [phone].   DIET:  We do recommend a small meal at first, but then you may proceed to your regular diet.  Drink plenty of fluids but you should avoid alcoholic beverages for 24 hours.  ACTIVITY:  You should plan to take it easy for the rest of today and you should NOT DRIVE or use heavy machinery until tomorrow (because of the sedation medicines used  during the test).    FOLLOW UP: Our staff will call the number listed on your records the next business day following your procedure to check on you and address any questions or concerns that you may have regarding the information given to you following your procedure. If we do not reach you, we will leave a message.  However, if you are feeling well and you are not experiencing any problems, there is no need to return our call.  We will assume that you have returned to your regular daily activities without incident.  If any biopsies were taken you will be contacted by phone or by letter within the next 1-3 weeks.  Please call us at [phone] if you have not heard about the biopsies in 3 weeks.    SIGNATURES/CONFIDENTIALITY: You and/or your care partner have signed paperwork which will be entered into your electronic medical record.  These signatures attest to the fact that that the information above on your After Visit Summary has been reviewed and is understood.  Full responsibility of the confidentiality of this discharge information lies with you and/or your care-partner.  Resume Eliquis tomorrow at prior dose  Information given on stenosis, after dilation diet given with instructions.  Hiatal hernia information given.  See Dr. Fuller Plan for office visit in 6 weeks.

## 2018-12-09 NOTE — Progress Notes (Signed)
Pt's states no medical or surgical changes since previsit or office visit. 

## 2018-12-09 NOTE — Progress Notes (Signed)
Called to room to assist during endoscopic procedure.  Patient ID and intended procedure confirmed with present staff. Received instructions for my participation in the procedure from the performing physician.  

## 2018-12-09 NOTE — Op Note (Signed)
[facility] Patient Name: [name] Procedure Date: 12/09/2018 8:59 AM MRN: [id number] Endoscopist: Ladene Artist , MD Age: 85 Referring MD:  Date of Birth: January 29, 1924 Gender: Female Account #: [id number] Procedure:                Upper GI endoscopy Indications:              Therapeutic procedure for dilation, Dysphagia,                            Abnormal esophagram Medicines:                Monitored Anesthesia Care Procedure:                Pre-Anesthesia Assessment:                           - Prior to the procedure, a History and Physical                            was performed, and patient medications and                            allergies were reviewed. The patient's tolerance of                            previous anesthesia was also reviewed. The risks                            and benefits of the procedure and the sedation                            options and risks were discussed with the patient.                            All questions were answered, and informed consent                            was obtained. Prior Anticoagulants: The patient has                            taken Eliquis (apixaban), last dose was 2 days                            prior to procedure. ASA Grade Assessment: III - A                            patient with severe systemic disease. After                            reviewing the risks and benefits, the patient was                            deemed in satisfactory condition to undergo the  procedure.                           After obtaining informed consent, the endoscope was                            passed under direct vision. Throughout the                            procedure, the patient's blood pressure, pulse, and                            oxygen saturations were monitored continuously. The                            Model GIF-[id number]) scope was introduced       through the mouth, and advanced to the second part                            of duodenum. The upper GI endoscopy was                            accomplished without difficulty. The patient                            tolerated the procedure well. Scope In: Scope Out: Findings:                 One benign-appearing, intrinsic moderate stenosis                            was found at the gastroesophageal junction. This                            stenosis measured 1.1 cm (inner diameter). The                            stenosis was traversed. A guidewire was placed and                            the scope was withdrawn. Dilation was performed                            with a Savary dilator with no resistance at 12 mm.                            Dilations were performed with Savary dilators with                            mild resistance at 13 mm, 14 mm and 15 mm.                           The exam of the esophagus was otherwise normal.  A medium-sized hiatal hernia was present.                           A single localized, medium non-bleeding linear                            erosion was found in the gastric fundus associated                            with the hiatal hernia. There were no stigmata of                            recent bleeding.                           The exam of the stomach was otherwise normal.                           The duodenal bulb and second portion of the                            duodenum were normal. Complications:            No immediate complications. Estimated Blood Loss:     Estimated blood loss: none. Impression:               - Benign-appearing esophageal stenosis. Dilated.                           - Medium-sized hiatal hernia.                           - Non-bleeding erosive gastropathy.                           - Normal duodenal bulb and second portion of the                            duodenum.                            - No specimens collected. Recommendation:           - Patient has a contact number available for                            emergencies. The signs and symptoms of potential                            delayed complications were discussed with the                            patient. Return to normal activities tomorrow.                            Written discharge instructions were provided to the  patient.                           - Clear liquid diet for 2 hours, then advance as                            tolerated to soft diet today. Resume prior diet                            tomorrow.                           - Continue present medications.                           - Resume Eliquis (apixaban) at prior dose tomorrow.                            Refer to managing physician for further adjustment                            of therapy.                           - Return to GI office in 6 weeks. Ladene Artist, MD 12/09/2018 9:25:33 AM This report has been signed electronically.

## 2018-12-10 ENCOUNTER — Telehealth: Payer: Self-pay | Admitting: *Deleted

## 2018-12-10 NOTE — Telephone Encounter (Signed)
  Follow up Call-  Call back number 12/09/2018  Post procedure Call Back phone  # [phone]  Permission to leave phone message Yes  Some recent data might be hidden     Patient questions:  Do you have a fever, pain , or abdominal swelling? No. Pain Score  0 *  Have you tolerated food without any problems? Yes.    Have you been able to return to your normal activities? Yes.    Do you have any questions about your discharge instructions: Diet   No. Medications  No. Follow up visit  No.  Do you have questions or concerns about your Care? No.  Actions: * If pain score is 4 or above: No action needed, pain <4.

## 2018-12-16 ENCOUNTER — Other Ambulatory Visit: Payer: Self-pay | Admitting: Family Medicine

## 2018-12-16 ENCOUNTER — Encounter: Payer: Medicare Other | Admitting: Gastroenterology

## 2018-12-16 NOTE — Telephone Encounter (Signed)
Dr. Fry please advise on refill of medication.  Thanks  

## 2018-12-17 NOTE — Telephone Encounter (Signed)
Call in #[phone] with 5 rf 

## 2018-12-17 NOTE — Telephone Encounter (Signed)
Please call this in  

## 2018-12-21 NOTE — Telephone Encounter (Signed)
Refill has been called to the pharmacy and left on the VM.  

## 2018-12-22 ENCOUNTER — Ambulatory Visit: Payer: Medicare Other | Admitting: Gastroenterology

## 2018-12-29 ENCOUNTER — Ambulatory Visit: Payer: Medicare Other | Admitting: Family Medicine

## 2018-12-29 ENCOUNTER — Encounter: Payer: Self-pay | Admitting: Family Medicine

## 2018-12-29 VITALS — BP 136/88 | HR 82 | Temp 98.4°F | Wt 128.0 lb

## 2018-12-29 DIAGNOSIS — H6121 Impacted cerumen, right ear: Secondary | ICD-10-CM | POA: Diagnosis not present

## 2018-12-29 MED ORDER — FEBUXOSTAT 40 MG PO TABS: 40.0000 mg | ORAL_TABLET | ORAL | 3 refills | Status: DC

## 2018-12-29 NOTE — Progress Notes (Signed)
   Subjective:    Patient ID: [name], female    DOB: 1924/03/06, 85 y.o.   MRN: [id number]  HPI Here to clean out cerumen from the right ear. She has had hearing loss for years and recently saw an Audiologist who wants to do a formal hearing evaluation. However they found cerumen in the ear and said this needs to be cleaned out first. There is no pain.   Review of Systems  Constitutional: Negative.   HENT: Positive for hearing loss. Negative for congestion, ear pain, postnasal drip, sinus pressure and sinus pain.   Eyes: Negative.   Respiratory: Negative.        Objective:   Physical Exam Constitutional:      Appearance: Normal appearance.  HENT:     Left Ear: Tympanic membrane and ear canal normal.     Ears:     Comments: Right canal is full of cerumen     Mouth/Throat:     Pharynx: Oropharynx is clear.  Eyes:     Conjunctiva/sclera: Conjunctivae normal.  Pulmonary:     Effort: Pulmonary effort is normal.     Breath sounds: Normal breath sounds.  Lymphadenopathy:     Cervical: No cervical adenopathy.  Neurological:     Mental Status: She is alert.           Assessment & Plan:  Cerumen impaction. This was irrigated clear with water. She will return to Audiology for her hearing test.  [name], MD

## 2018-12-30 ENCOUNTER — Other Ambulatory Visit: Payer: Self-pay | Admitting: Family Medicine

## 2018-12-30 NOTE — Telephone Encounter (Signed)
Last rx given on 8/13 for #60 with 5 ref

## 2018-12-31 NOTE — Telephone Encounter (Signed)
Rx done. 

## 2018-12-31 NOTE — Telephone Encounter (Signed)
Call in #[phone] with 5 rf 

## 2019-01-01 ENCOUNTER — Telehealth: Payer: Self-pay | Admitting: Cardiology

## 2019-01-01 ENCOUNTER — Other Ambulatory Visit: Payer: Self-pay

## 2019-01-01 MED ORDER — NITROGLYCERIN 0.4 MG SL SUBL: 0.4000 mg | SUBLINGUAL_TABLET | SUBLINGUAL | 3 refills | Status: DC | PRN

## 2019-01-01 NOTE — Telephone Encounter (Signed)
New message:    Patient need a nitrostat refill it has been about 2 years they expired. If the patient need it, it can be called and if not please let patient daughter know.

## 2019-01-01 NOTE — Telephone Encounter (Signed)
Called, LVM for daughter advising medication was sent, but follow up appointment needed to be made.

## 2019-01-07 ENCOUNTER — Telehealth: Payer: Self-pay | Admitting: Cardiology

## 2019-01-07 NOTE — Telephone Encounter (Signed)
Patient goes to [facility], Dr. [name] has left the practice. She would like Dr. Ellyn Hack to refer her to another provider in that office.

## 2019-01-11 NOTE — Telephone Encounter (Signed)
It is generally up to the retiring physician to use his ensure the patient has a nephrologist that she follows up with. There are several nephrologist there that are reasonable: Dr. Vanetta Mulders, Dr. Joelyn Oms, Dr. Arty Baumgartner, Dr. Carolin Sicks. . .   She needs to contact their office to see if they can establish a new nephrologist.  Glenetta Hew, MD

## 2019-01-11 NOTE — Telephone Encounter (Signed)
patient aware will defer to [name] . Patient aware he is not in the office this week .

## 2019-01-12 NOTE — Telephone Encounter (Signed)
Spoke to patient - information given.  Verbalized understanding

## 2019-01-25 ENCOUNTER — Ambulatory Visit: Payer: Medicare Other | Admitting: Gastroenterology

## 2019-02-16 ENCOUNTER — Other Ambulatory Visit: Payer: Self-pay | Admitting: Family Medicine

## 2019-02-20 IMAGING — DX DG CHEST 1V PORT
1 series · 1 of 1 positions shown · non-contrast
Comparison: 11/15/2017

CLINICAL DATA: Cough and congestion. Chest pressure and shortness
of breath.

EXAM:
PORTABLE CHEST 1 VIEW

[chest ap]
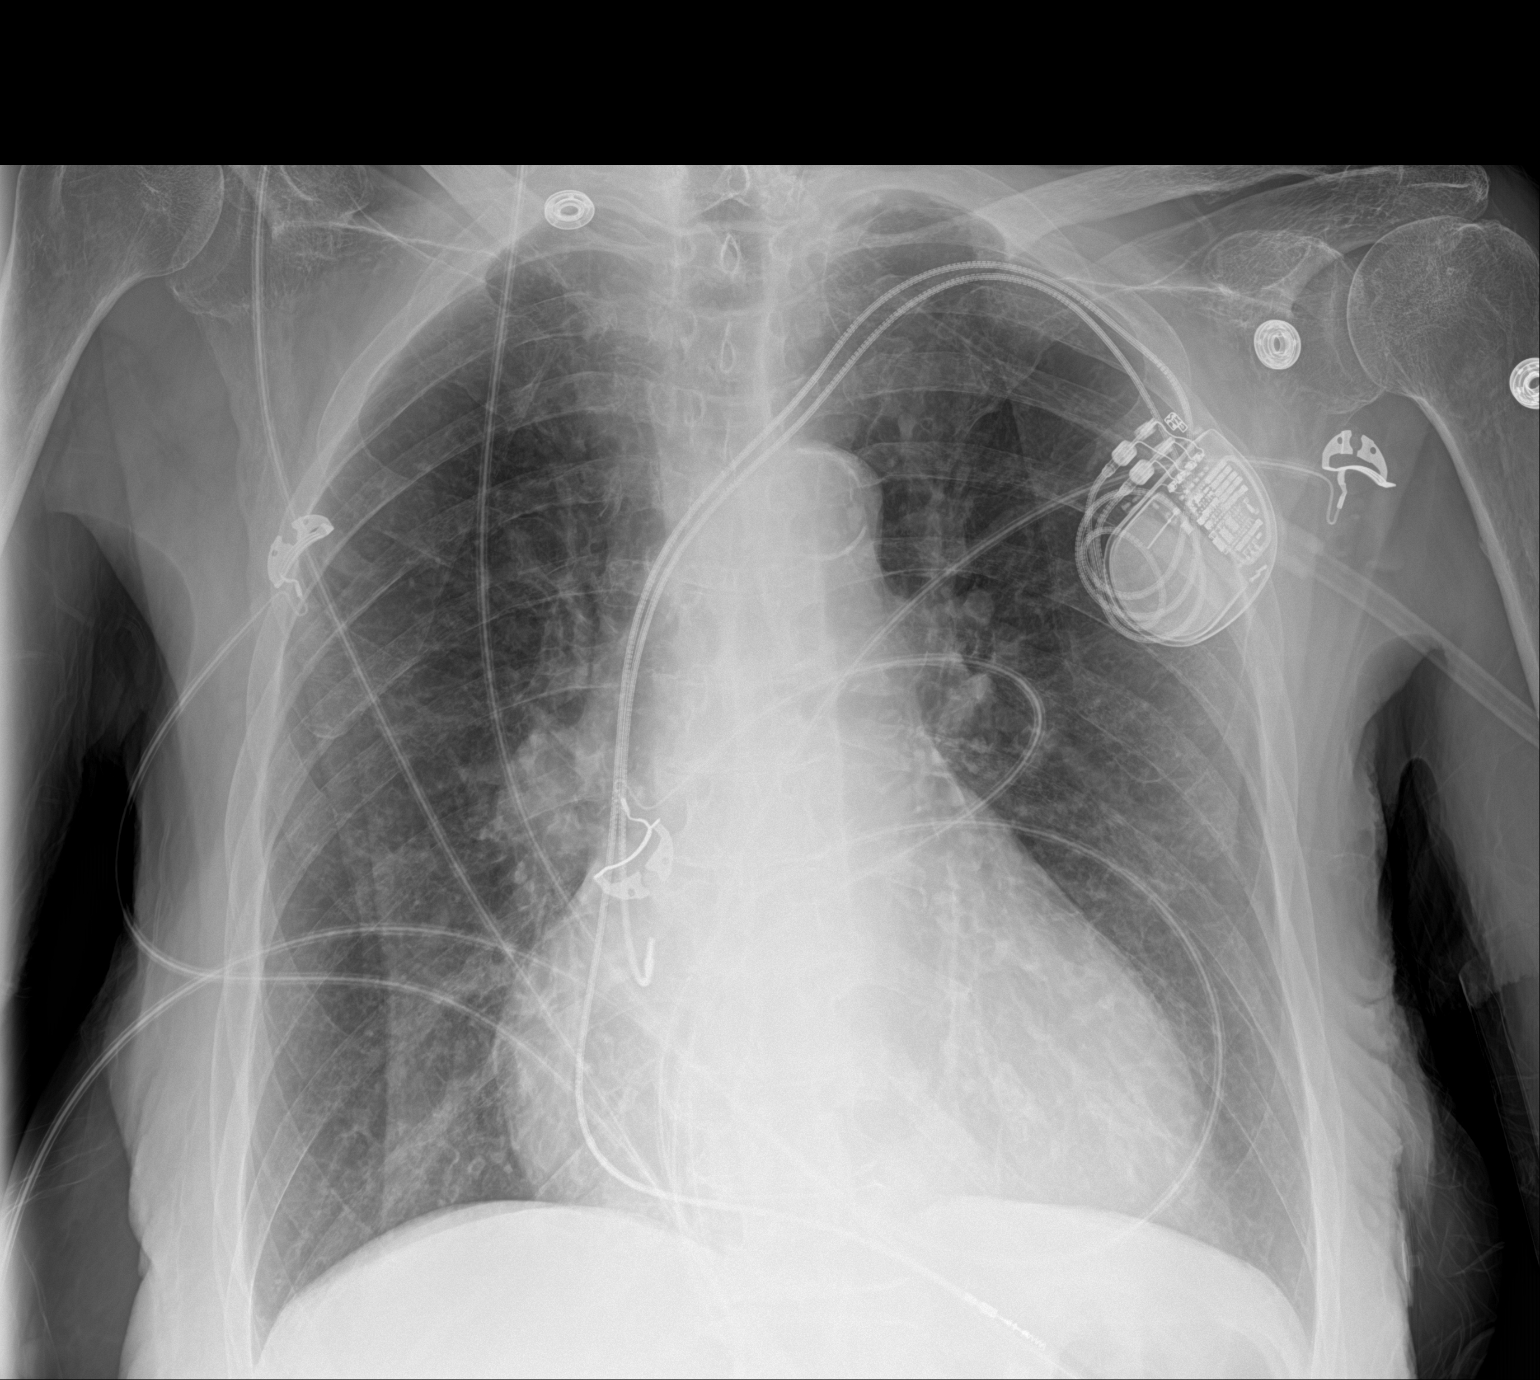

[1 of 1 positions shown; findings below may reference images not displayed]

FINDINGS: 4901 hours. Lungs are hyperexpanded. The lungs are clear without
focal pneumonia, edema, pneumothorax or pleural effusion. The cardio
pericardial silhouette is enlarged. Left permanent pacemaker remains
in place. The visualized bony structures of the thorax are intact.
Telemetry leads overlie the chest.
IMPRESSION: Cardiomegaly without acute cardiopulmonary findings.

## 2019-02-21 IMAGING — DX DG CHEST 2V
2 series · 2 of 2 positions shown · non-contrast
Comparison: 12/02/2017 and 11/15/2017 and 03/02/2017

CLINICAL DATA: Shortness of breath and cough.

EXAM:
CHEST  2 VIEW

[x chest ap]
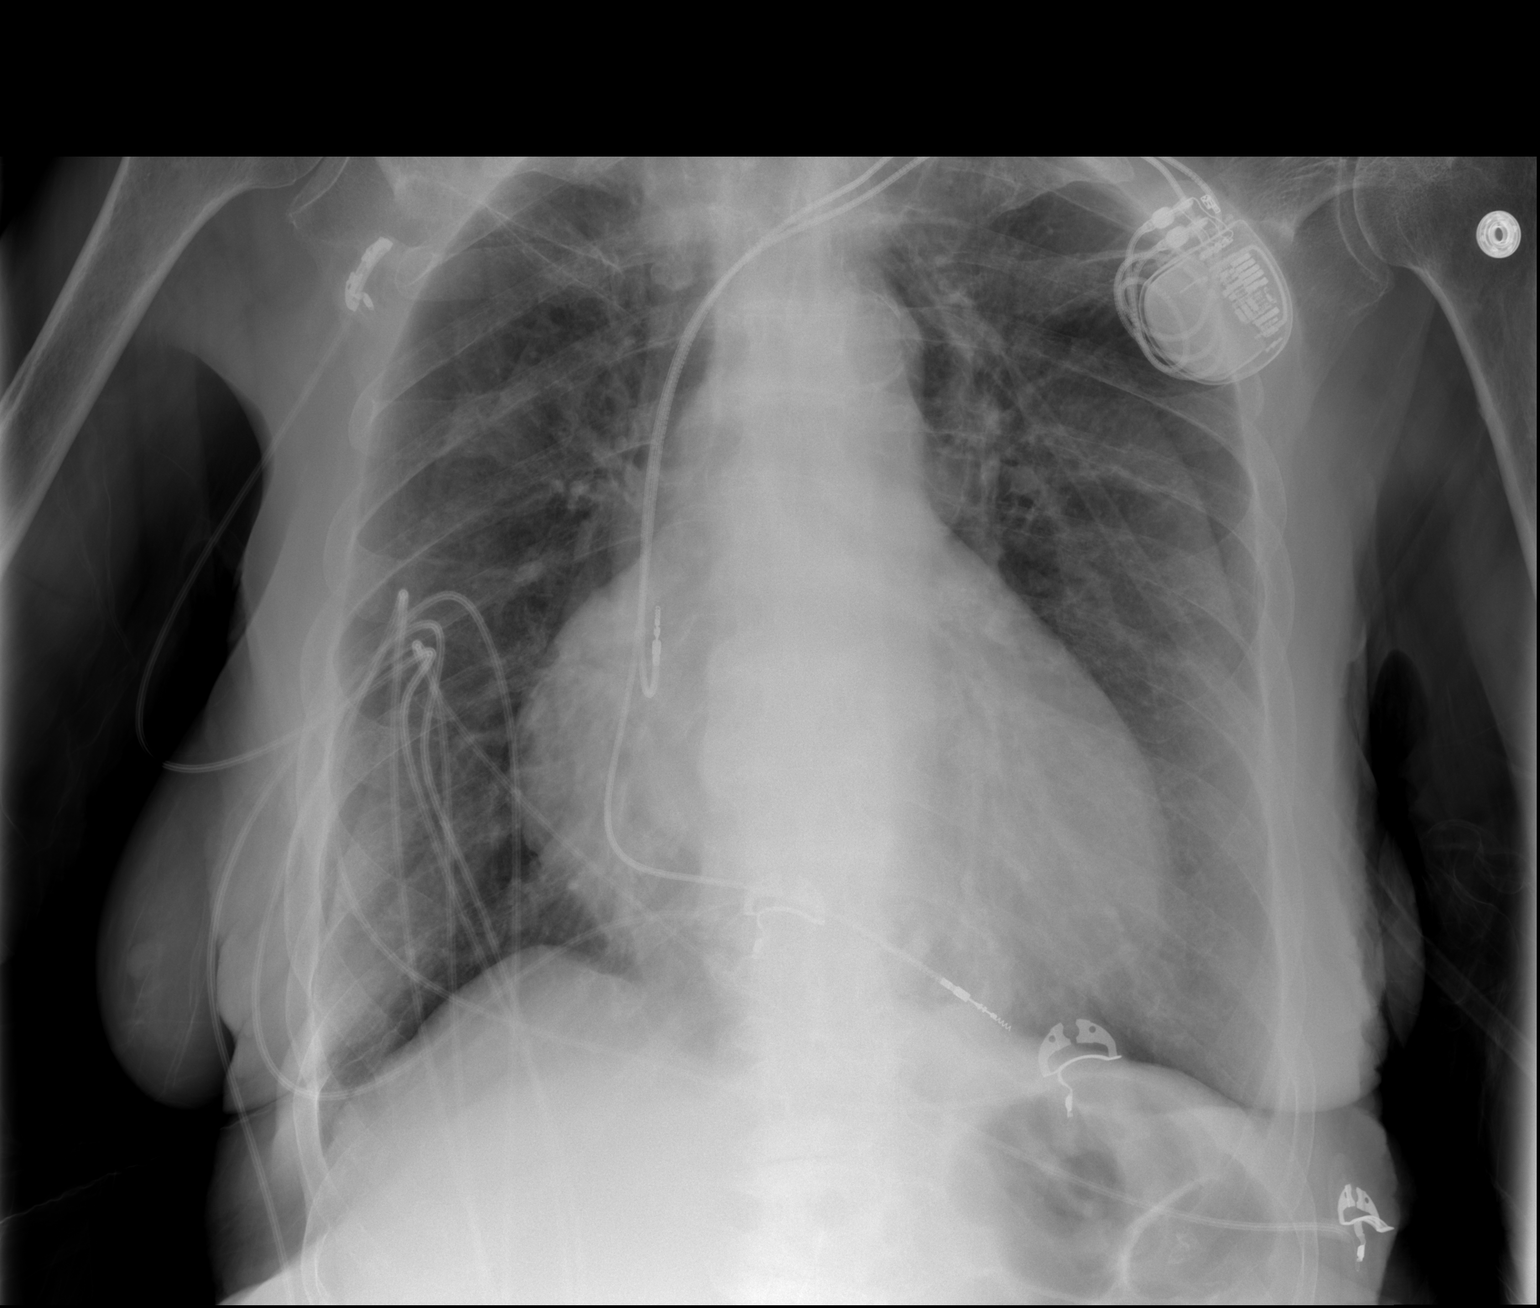

[w chest lat]
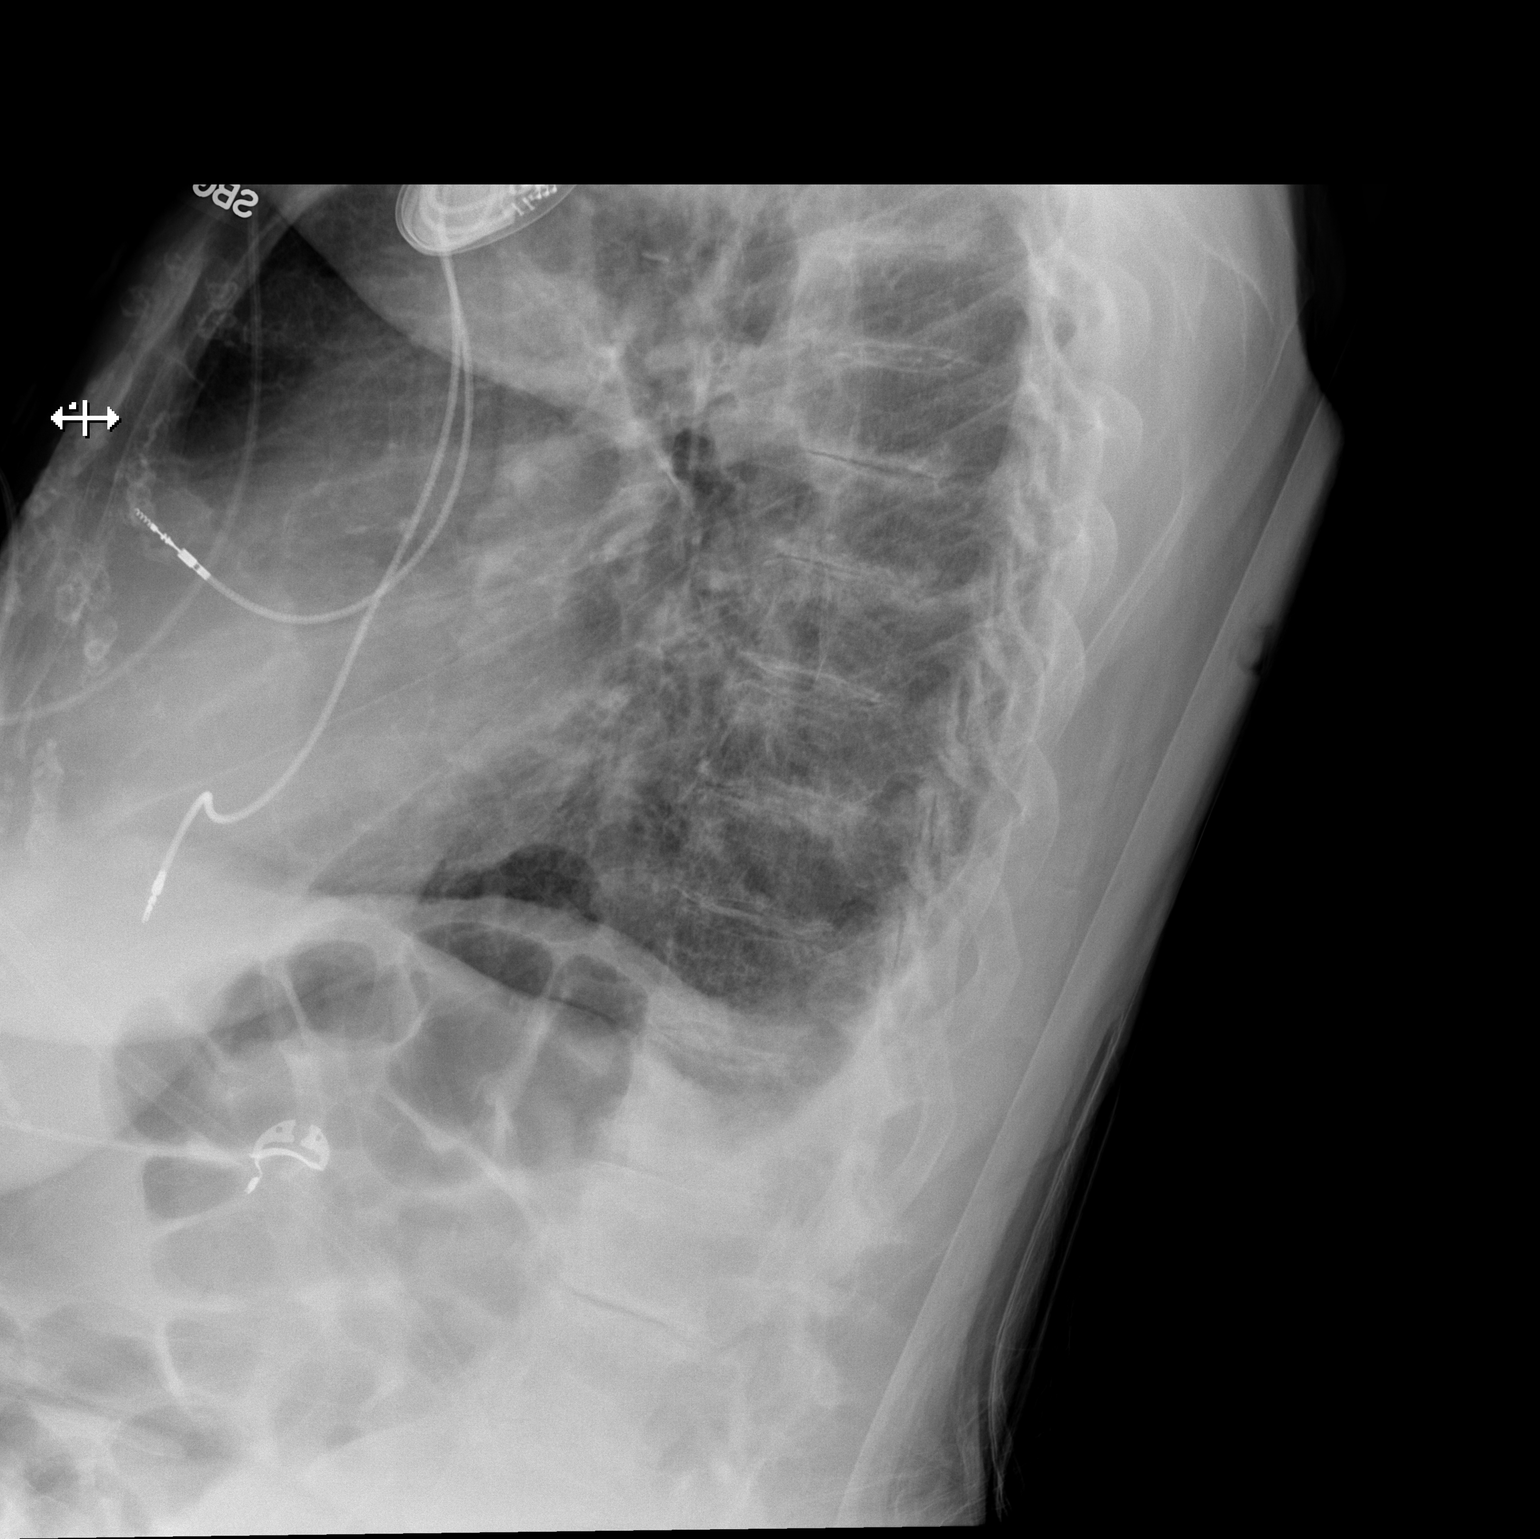

[2 of 2 positions shown; findings below may reference images not displayed]

FINDINGS: There is chronic cardiomegaly with calcification of the thoracic
aorta. Pulmonary vascularity is normal. No infiltrates. Slight
blunting of the posterior costophrenic angles probably represents
tiny bilateral effusions.

Pacemaker in place. No acute bone abnormality. Moderate hiatal
hernia.
IMPRESSION: 1. Chronic cardiomegaly.
2. Tiny bilateral pleural effusions.
3. Moderate hiatal hernia.

## 2019-02-23 IMAGING — CR DG CHEST 2V
2 series · 2 of 2 positions shown · non-contrast
Comparison: Two-view chest x-ray 12/03/2017

CLINICAL DATA: Shortness of breath for 3-4 weeks.

EXAM:
CHEST  2 VIEW

[chest pa]
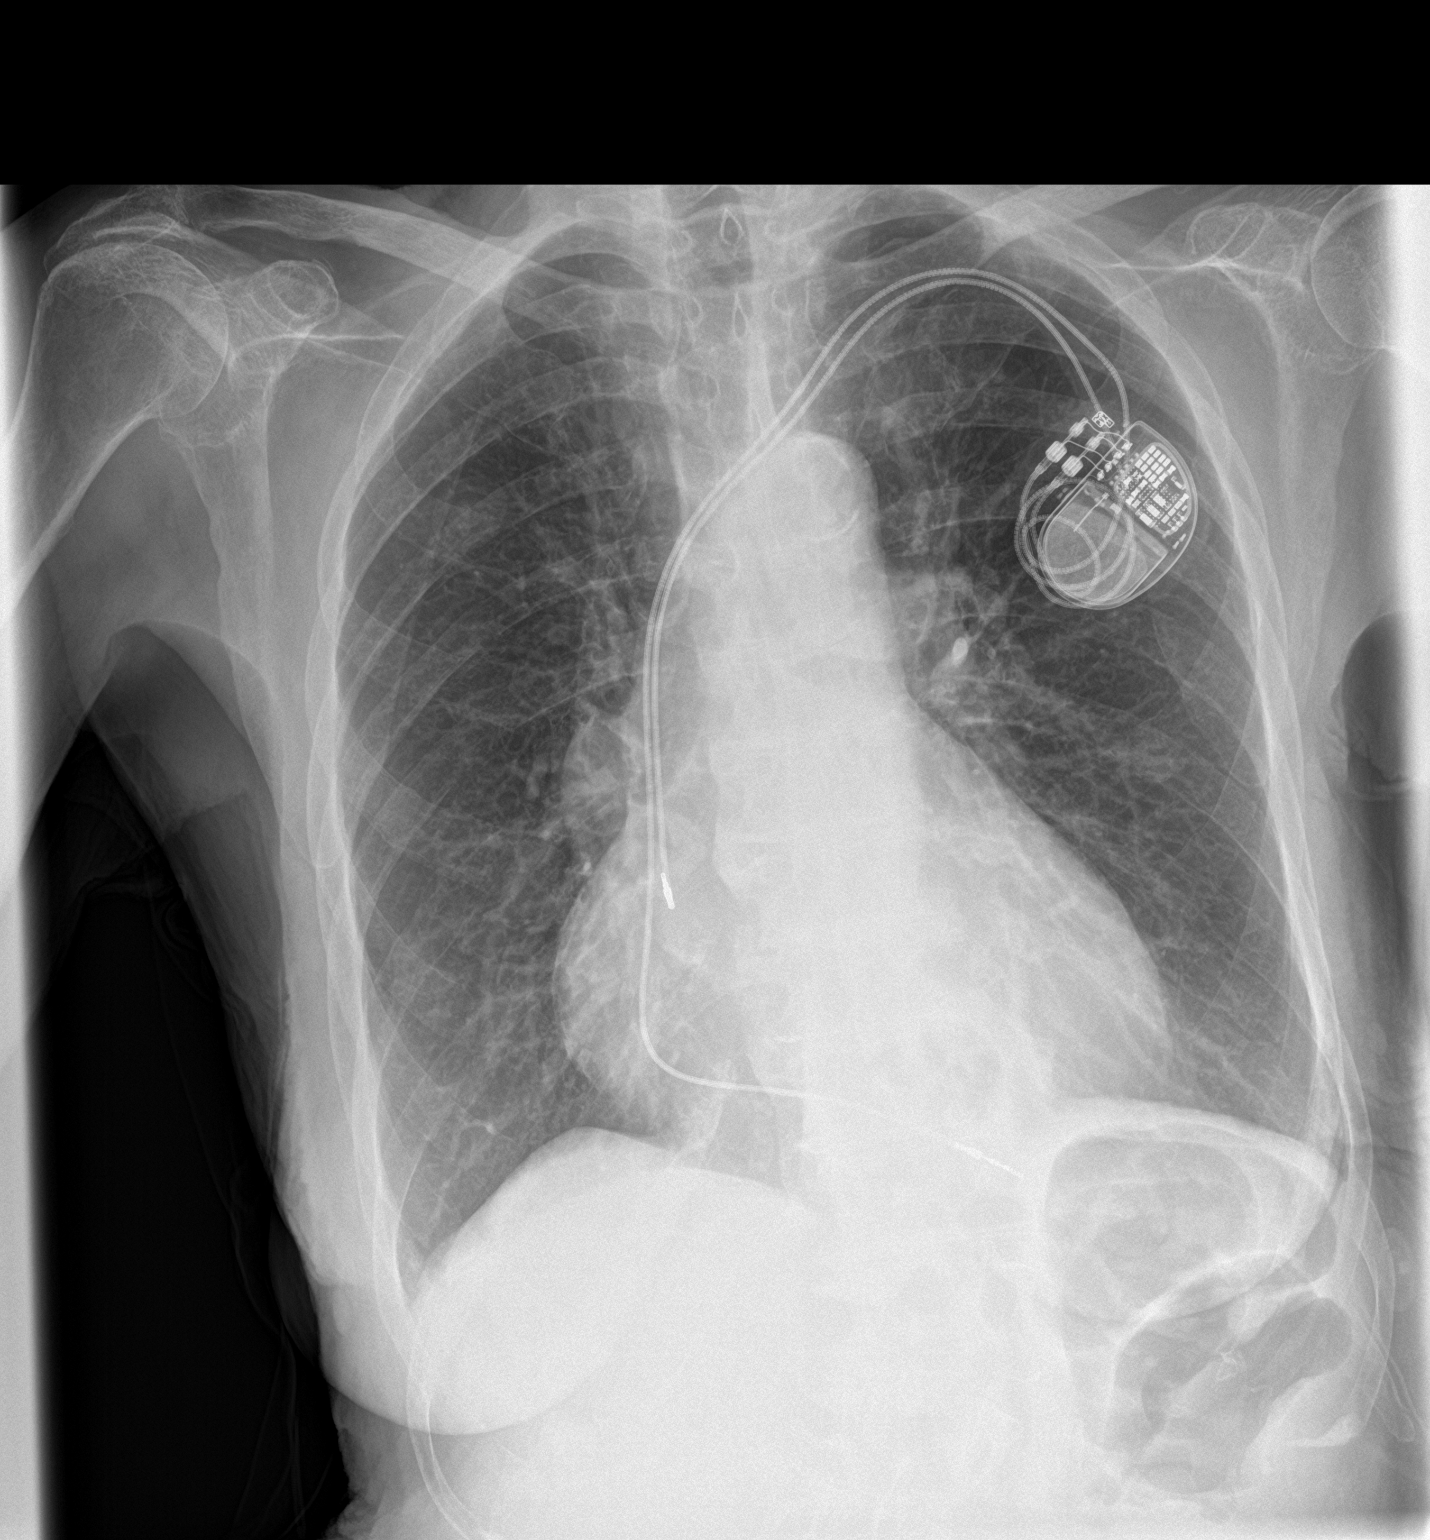

[chest lat]
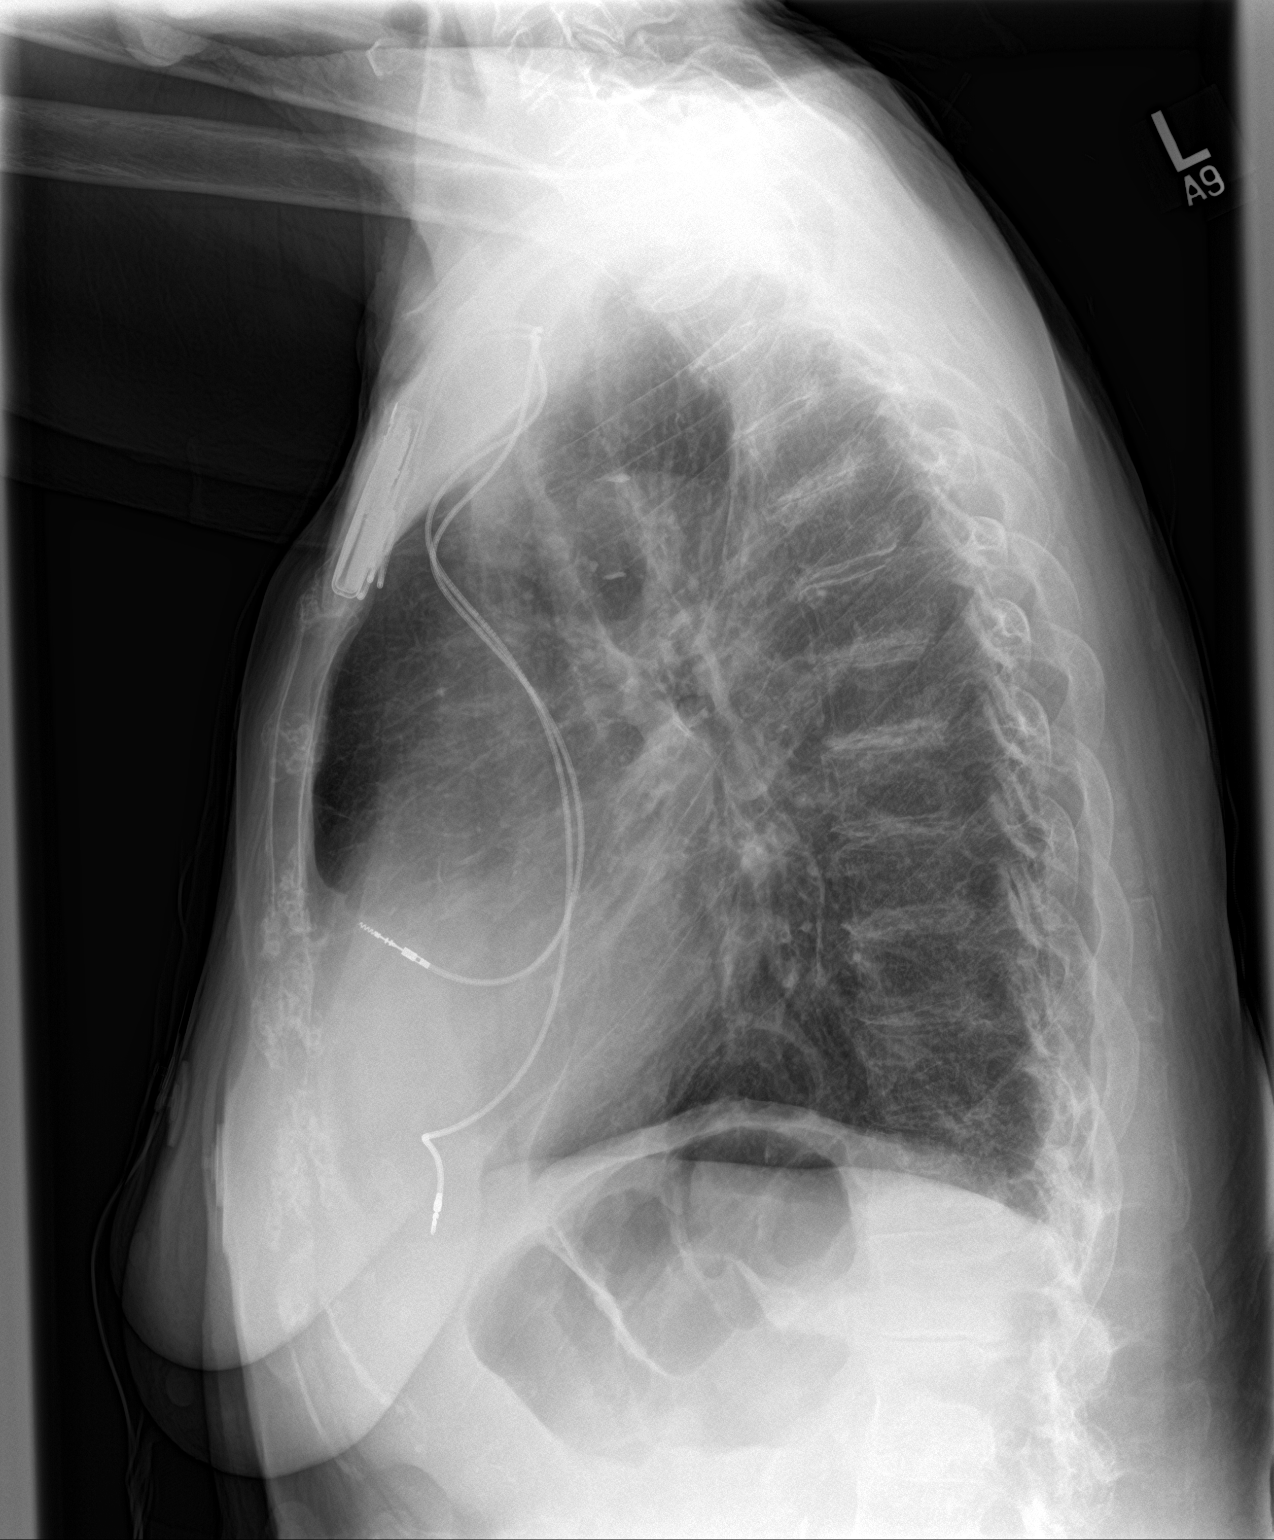

[2 of 2 positions shown; findings below may reference images not displayed]

FINDINGS: The heart is enlarged. Mild pulmonary vascular congestion is
present. Pacing wires are stable. Atherosclerotic calcifications are
present at the aortic arch. There is no edema or effusion. No focal
airspace consolidation is present. Degenerative changes of the
thoracic spine are stable.
IMPRESSION: 1. Stable cardiomegaly.
2. Mild pulmonary vascular congestion without frank edema.

## 2019-03-08 ENCOUNTER — Other Ambulatory Visit: Payer: Self-pay

## 2019-03-08 ENCOUNTER — Encounter: Payer: Medicare Other | Admitting: *Deleted

## 2019-03-09 ENCOUNTER — Other Ambulatory Visit: Payer: Self-pay

## 2019-03-09 ENCOUNTER — Encounter: Payer: Self-pay | Admitting: Gastroenterology

## 2019-03-09 ENCOUNTER — Telehealth: Payer: Self-pay

## 2019-03-09 ENCOUNTER — Ambulatory Visit (INDEPENDENT_AMBULATORY_CARE_PROVIDER_SITE_OTHER): Payer: Medicare Other | Admitting: Gastroenterology

## 2019-03-09 VITALS — Ht 62.0 in | Wt 128.0 lb

## 2019-03-09 DIAGNOSIS — K222 Esophageal obstruction: Secondary | ICD-10-CM | POA: Diagnosis not present

## 2019-03-09 DIAGNOSIS — K219 Gastro-esophageal reflux disease without esophagitis: Secondary | ICD-10-CM | POA: Diagnosis not present

## 2019-03-09 DIAGNOSIS — R1310 Dysphagia, unspecified: Secondary | ICD-10-CM

## 2019-03-09 DIAGNOSIS — R1319 Other dysphagia: Secondary | ICD-10-CM

## 2019-03-09 NOTE — Telephone Encounter (Signed)
Left message for patient to remind of missed remote transmission.  

## 2019-03-09 NOTE — Progress Notes (Signed)
    History of Present Illness: This is a 85 year old white female with a history of IBS, GERD, Cameron erosions and an esophageal stricture.  She reported problems with sinus drainage, globus sensation active reflux symptoms and solid food dysphagia at her visit in January.  She notes an improvement in her reflux symptoms on twice daily omeprazole.  She has had intermittent mild solid food dysphasia however it improved significantly following dilation.   EGD 11/2018 - Benign-appearing esophageal stenosis, 11 mm. Dilated to 15 mm. - Medium-sized hiatal hernia. - Non-bleeding erosive gastropathy. - Normal duodenal bulb and second portion of the duodenum. - No specimens collected   Current Medications, Allergies, Past Medical History, Past Surgical History, Family History and Social History were reviewed in [employer] record.   Physical Exam: Telemedicine-not performed   Assessment and Recommendations:  1.  Solid food dysphagia, improved however persists.  Esophageal stricture, recently dilated. GERD.  Continue omeprazole 40 mg twice daily.  Follow standard antireflux measures.  Offered patient repeat upper endoscopy with dilation however due to COVID-19 pandemic restrictions she is reluctant to schedule however she will reassess her symptoms over the next several weeks and contact us if dysphagia persists, worsens.  2.  IBS, alternating pattern.  Continue Levsin 0.125 mg 1-2 p.o. every 4 hours as needed.  Imodium 3 times daily as needed diarrhea. Gas-X qid prn.   3.  Cameron erosions, moderate sized hiatal hernia.  4.  Chronic anticoagulation maintained on Eliquis.   These services were provided via telemedicine, audio only per patient request.  The patient was at home and the provider was in the office, alone.  We discussed the limitations of evaluation and management by telemedicine and the availability of in person appointments.  Patient consented for this  telemedicine visit and is aware of possible charges for this service.  The other person participating in the telemedicine service was Marlon Pel, CMA who reviewed medications, allergies, past history and completed AVS.  Time spent on call: 15 minutes

## 2019-03-09 NOTE — Patient Instructions (Signed)
Continue omeprazole 40 mg twice daily and Levsin at current dose.   You can take Gas-X four times a day as needed.

## 2019-03-22 ENCOUNTER — Ambulatory Visit (INDEPENDENT_AMBULATORY_CARE_PROVIDER_SITE_OTHER): Payer: Medicare Other | Admitting: *Deleted

## 2019-03-22 ENCOUNTER — Other Ambulatory Visit: Payer: Self-pay

## 2019-03-22 DIAGNOSIS — I495 Sick sinus syndrome: Secondary | ICD-10-CM

## 2019-03-22 DIAGNOSIS — I442 Atrioventricular block, complete: Secondary | ICD-10-CM

## 2019-03-23 LAB — CUP PACEART REMOTE DEVICE CHECK
Battery Remaining Longevity: 74 mo
Battery Voltage: 3 V
Brady Statistic AP VP Percent: 0 %
Brady Statistic AP VS Percent: 0 %
Brady Statistic AS VP Percent: 0 %
Brady Statistic AS VS Percent: 0 %
Brady Statistic RA Percent Paced: 0 %
Brady Statistic RV Percent Paced: 98.22 %
Date Time Interrogation Session: 20200509212051
Implantable Lead Implant Date: 20160620
Implantable Lead Implant Date: 20160620
Implantable Lead Location: 753859
Implantable Lead Location: 753860
Implantable Lead Model: 5076
Implantable Lead Model: 5076
Implantable Pulse Generator Implant Date: 20160620
Lead Channel Impedance Value: 285 Ohm
Lead Channel Impedance Value: 342 Ohm
Lead Channel Impedance Value: 342 Ohm
Lead Channel Impedance Value: 437 Ohm
Lead Channel Pacing Threshold Amplitude: 0.625 V
Lead Channel Pacing Threshold Amplitude: 0.75 V
Lead Channel Pacing Threshold Pulse Width: 0.4 ms
Lead Channel Pacing Threshold Pulse Width: 0.4 ms
Lead Channel Sensing Intrinsic Amplitude: 0.375 mV
Lead Channel Sensing Intrinsic Amplitude: 0.75 mV
Lead Channel Sensing Intrinsic Amplitude: 21.25 mV
Lead Channel Sensing Intrinsic Amplitude: 21.25 mV
Lead Channel Setting Pacing Amplitude: 2 V
Lead Channel Setting Pacing Pulse Width: 0.4 ms
Lead Channel Setting Sensing Sensitivity: 4 mV

## 2019-03-30 NOTE — Progress Notes (Signed)
Remote pacemaker transmission.   

## 2019-04-14 ENCOUNTER — Telehealth: Payer: Self-pay | Admitting: Cardiology

## 2019-04-14 NOTE — Telephone Encounter (Signed)
Video visit/call pt's daughter [name] @ [phone], she will be assisting/my chart/pre reg complete/consent obtained -- ttf

## 2019-04-21 ENCOUNTER — Encounter: Payer: Self-pay | Admitting: Cardiology

## 2019-04-21 ENCOUNTER — Telehealth: Payer: Self-pay

## 2019-04-21 ENCOUNTER — Telehealth (INDEPENDENT_AMBULATORY_CARE_PROVIDER_SITE_OTHER): Payer: Medicare Other | Admitting: Cardiology

## 2019-04-21 VITALS — BP 168/78 | HR 81 | Temp 97.8°F | Wt 125.8 lb

## 2019-04-21 DIAGNOSIS — R600 Localized edema: Secondary | ICD-10-CM

## 2019-04-21 DIAGNOSIS — I5032 Chronic diastolic (congestive) heart failure: Secondary | ICD-10-CM

## 2019-04-21 DIAGNOSIS — Z7901 Long term (current) use of anticoagulants: Secondary | ICD-10-CM

## 2019-04-21 DIAGNOSIS — I4819 Other persistent atrial fibrillation: Secondary | ICD-10-CM | POA: Diagnosis not present

## 2019-04-21 DIAGNOSIS — I110 Hypertensive heart disease with heart failure: Secondary | ICD-10-CM

## 2019-04-21 DIAGNOSIS — R42 Dizziness and giddiness: Secondary | ICD-10-CM | POA: Insufficient documentation

## 2019-04-21 NOTE — Progress Notes (Signed)
Virtual Visit via Video Note   This visit type was conducted due to national recommendations for restrictions regarding the COVID-19 Pandemic (e.g. social distancing) in an effort to limit this patient's exposure and mitigate transmission in our community.  Due to her co-morbid illnesses, this patient is at least at moderate risk for complications without adequate follow up.  This format is felt to be most appropriate for this patient at this time.  All issues noted in this document were discussed and addressed.  A limited physical exam was performed with this format.  Please refer to the patient's chart for her consent to telehealth for [facility].   Patient has given verbal permission to conduct this visit via virtual appointment and to bill insurance 04/23/2019 11:33 PM     Evaluation Performed:  Follow-up visit  Date:  04/23/2019   ID:  [name], DOB Oct 22, 1924, MRN [id number]  Patient Location: Home Provider Location: Office  PCP:  Lindsey Morale, MD  Cardiologist:  Lindsey Hew, MD  Electrophysiologist:  None   Chief Complaint:  6 mont h f/u - Afib, s/pPPM (04/2015) for Tachy-Brady/CHB/Syncope  History of Present Illness:    [name] is a 85 y.o. female with PMH notable for chronic atrial fibrillation as well as history of complete heart block status post pacemaker placement (for syncope) who presents via audio/video conferencing for a telehealth visit today.  Echo in April 2018 showed normal LVF, mild LVH, and severe LAE.  Lexiscan in May 2018 was low risk.   Chronic Afib - no longer on Amiodarone.   PPM on VVIR CRI - sees [name] - > changing to a new Nephrologist.   EXA BOMBA was last seen in December by [name].  She was having issues with anxiety.  Swelling resolved after switching from amlodipine.  Occasional palpitations but nothing sustained.  Just not doing as well since being VVIR on pacemaker.  Not feeling as strong as she had been.  Also  noted weight that was drifting down.  Vague sensation of dyspnea and chest pressure at night but probably more related to anxiety. -->  Not thought to be related to heart failure. Stable on Eliquis.  Plan was to discuss pacemaker issues with Dr. Caryl Scott  Eventually had Esophageal stretching - eating better, Energy levels better.  Was doing great until COVID. Now anxiety is picking back up. Chest discomfort better.   Son had CABG last fall -- that had her all upset.   Interval History:  I am seeing [name] today via telephone video visit and she is doing great.  She is in great spirits.  She is really not having any major symptoms. She does note that every once in a while, she will feel a weird sensation in her chest especially with anxiety or being excited, but these episodes are very fleeting.  She notes some dizziness but only when her allergies act up.  Which usually occurs when she first wakes up in the morning.  She does note that sometimes she will take her Restoril a bit late and occasionally like to take her alprazolam in the middle of night to help her sleep.  Her edema and orthopnea symptoms been totally under control.  She is only used 2 furosemide total this year  No bleeding issue on low-dose apixaban.  Cardiovascular ROS: no chest pain or dyspnea on exertion positive for - Occasional palpitations but nothing prolonged negative for - edema, loss of consciousness, orthopnea,  paroxysmal nocturnal dyspnea, rapid heart rate, shortness of breath or Near syncope, TIA shows amorous fugax.  Melena, hematochezia, hematuria or epistaxis.  The patient does not have symptoms concerning for COVID-19 infection (fever, chills, cough, or new shortness of breath).  The patient is practicing social distancing.  ROS:  Please see the history of present illness.    ROS   Getting hearing aids.  Past Medical History:  Diagnosis Date  . Allergy   . Anemia   . Anxiety   . Arthritis   . Asthma   .  Benign positional vertigo   . Blood transfusion without reported diagnosis   . Cardiac pacemaker in situ 05/01/2015   for syncope & Sx Bradycardia -> Medtronic MRI compatible pacemaker placed Dr. Caryl Scott   . Cataract   . Chronic diarrhea   . Chronic kidney disease, stage IV (severe) ([place])   . Colon polyps   . Diastolic dysfunction, left ventricle 11/03/2013  . Diverticulosis   . Esophageal stricture   . GERD (gastroesophageal reflux disease)   . Gout   . Heart murmur   . Hemorrhoids   . Hyperlipidemia   . Hypertensive heart disease    no significant RAS by MRA 2002- (<30% LRAS)   . Hypothyroidism   . IBS (irritable bowel syndrome)   . LBBB (left bundle branch block)- new 11/01/13 11/01/2013  . Paroxysmal atrial fibrillation 11/01/2013   Intermittent through the years and recurrent in December of 2014 treated with amiodarone;; Negative Myoview 10/2013  . Peripheral vascular disease    36-14% LICA, 4-31% RICA by doppler 2009   . Sinus arrest 04/28/2015  . Syncope and collapse 04/28/2015   s/p PPM  . Tubulovillous adenoma 4/07   Past Surgical History:  Procedure Laterality Date  . APPENDECTOMY    . CATARACT EXTRACTION    . EP IMPLANTABLE DEVICE N/A 05/01/2015   Procedure: Pacemaker Implant;  Surgeon: Lindsey Sprang, MD;  Location: [place] [facility];  Service: Cardiovascular;  Laterality: N/A;  . HIP ARTHROPLASTY Left 05/01/2015   Procedure: ARTHROPLASTY BIPOLAR HIP (HEMIARTHROPLASTY);  Surgeon: Lindsey Can, MD;  Location: [place];  Service: Orthopedics;  Laterality: Left;  . LOW ANTERIOR BOWEL RESECTION  7/08  . [place] [facility]  2000  . NM MYOVIEW LTD  03/18/2013   Negative for ischemia or infarction. EF 55%.  LOW RISK  . OOPHORECTOMY  1962   right  . squamous cell skin cancer removed    . TRANSTHORACIC ECHOCARDIOGRAM  10/2013, 02/2017   A) EF 55-60%, mild LVH, elevated bili pressures. Mild aortic valve calcification;; B) Normal LV size and function with mild LVH. Unable to  assess diastolic function. Normal PA pressures. Mild MR,Mild AI     Current Meds  Medication Sig  . acetaminophen (TYLENOL) 325 MG tablet Take 2 tablets (650 mg total) by mouth every 6 (six) hours as needed for mild pain (or Fever >/= 101).  Marland Kitchen apixaban (ELIQUIS) 2.5 MG TABS tablet Take 1 tablet (2.5 mg total) by mouth 2 (two) times daily.  . CVS VITAMIN B12 1000 MCG tablet TAKE 1 TABLET BY MOUTH EVERY DAY  . febuxostat (ULORIC) 40 MG tablet Take 1 tablet (40 mg total) by mouth every other day.  . furosemide (LASIX) 40 MG tablet Take 0.5 tablets (20 mg total) by mouth daily as needed. Take 40mg  only if more than 2-3 pounds overnight and/or 5 pounds in a week  . hydrALAZINE (APRESOLINE) 25 MG tablet Take 1 tablet (25 mg  total) by mouth 2 (two) times daily. TAKE THIS 25 MG DOSE ALONG WITH YOUR 50 MG DOSE (75 MG TOTAL) TWICE DAILY.  . hydrALAZINE (APRESOLINE) 50 MG tablet Take 1 tablet (50 mg total) by mouth 2 (two) times daily. TAKE THIS 50 MG DOSE ALONG WITH YOUR 25 MG DOSE (75 MG TOTAL) TWICE DAILY.  . isosorbide mononitrate (IMDUR) 30 MG 24 hr tablet Take 15 mg by mouth daily.  Marland Kitchen levothyroxine (SYNTHROID, LEVOTHROID) 88 MCG tablet TAKE 1 TABLET (88 MCG TOTAL) BY MOUTH DAILY BEFORE BREAKFAST.  Marland Kitchen loperamide (IMODIUM) 2 MG capsule Take 2 mg by mouth as needed for diarrhea or loose stools.  Marland Kitchen LORazepam (ATIVAN) 0.5 MG tablet TAKE 1 TABLET BY MOUTH EVERY 8 HOURS AS NEEDED  . nitroGLYCERIN (NITROSTAT) 0.4 MG SL tablet Place 1 tablet (0.4 mg total) under the tongue every 5 (five) minutes x 3 doses as needed for chest pain.  Marland Kitchen omeprazole (PRILOSEC) 40 MG capsule Take 1 capsule (40 mg total) by mouth 2 (two) times daily.  . Potassium Chloride ER 20 MEQ TBCR Take 20 mEq by mouth daily as needed. Take only if you are taking your Lasix that day.  . temazepam (RESTORIL) 30 MG capsule Take 15 mg by mouth at bedtime as needed for sleep.     Allergies:   Penicillins, Xifaxan [rifaximin], Ambien [zolpidem  tartrate], Amoxicillin, Ceftin [cefuroxime axetil], Codeine phosphate, Colchicine, Levofloxacin, Lipitor [atorvastatin], and Amlodipine   Social History   Tobacco Use  . Smoking status: Never Smoker  . Smokeless tobacco: Never Used  . Tobacco comment: never used tobacco  Substance Use Topics  . Alcohol use: No    Alcohol/week: 0.0 standard drinks  . Drug use: No     Family Hx: The patient's family history includes Cancer (age of onset: 36) in her mother; Colon cancer in her daughter; Heart attack in her father; Heart disease in her brother; Lung cancer in her maternal grandfather; Stroke in her maternal grandmother and paternal grandmother. There is no history of Esophageal cancer, Rectal cancer, or Stomach cancer.   Prior CV studies:   The following studies were reviewed today: . none:  Labs/Other Tests and Data Reviewed:    EKG:  No ECG reviewed.  Recent Labs: 07/08/2018: BUN 33; Creatinine, Ser 1.60; Hemoglobin 11.9; Platelets 189; Potassium 5.0; Sodium 143   Recent Lipid Panel Lab Results  Component Value Date/Time   CHOL 159 11/02/2013 04:40 AM   TRIG 111 11/02/2013 04:40 AM   HDL 47 11/02/2013 04:40 AM   CHOLHDL 3.4 11/02/2013 04:40 AM   LDLCALC 90 11/02/2013 04:40 AM   LDLDIRECT 142.3 09/08/2013 11:47 AM    Wt Readings from Last 3 Encounters:  04/21/19 125 lb 12.8 oz (57.1 kg)  03/09/19 128 lb (58.1 kg)  12/29/18 128 lb (58.1 kg)     Objective:    Vital Signs:  BP (!) 168/78   Pulse 81   Temp 97.8 F (36.6 C)   Wt 125 lb 12.8 oz (57.1 kg)   BMI 23.01 kg/m   VITAL SIGNS:  reviewed GEN:  Well-nourished, well-groomed elderly woman who appears younger than her stated age.  No acute distress. SKIN:  Multiple spots on spots etc., but no rashes or ulcers. MUSCULOSKELETAL:  no obvious deformities. NEURO:  alert and oriented x 3, no obvious focal deficit PSYCH:  normal affect  ASSESSMENT & PLAN:    Problem List Items Addressed This Visit    Chronic  anticoagulation- Eliquis - Primary (Chronic)  Chronic diastolic heart failure (HCC) (Chronic)   Edema of both legs   Hypertensive heart disease (Chronic)   Orthostatic dizziness   Persistent Paroxysmal atrial fibrillation: CHA2DS2-VASc Score 5, on Eliquis (Chronic)     For the most part mother is doing very well.  She is not really feeling her A. fib nor is she feeling any adverse effects of hypertensive heart disease with any PND orthopnea or edema.  Minimal Lasix dosing.  No sensation of being in A. fib if she is in A. Fib.  No bleeding issues on Eliquis for stroke prophylaxis.  A lot of her fatigue and dizzy issues have improved and she is doing 75 twice daily hydralazine, but I think with her morning dizziness I like for her to take her p.m. dose of hydralazine earlier at night. ->  I told her I really comfortable with PERMISSIVE HYPERTENSION allowing systolic blood pressures range from 140 to 160 mmHg.  Also with her morning dizziness, I think is probably dangerous for her to take Restoril plus alprazolam in the same dosing interval.  I recommended that she take the Restoril lipid earlier, and try to avoid taking a benzodiazepine at night.  This clearly will set her up for being dizzy in the morning.   COVID-19 Education: The signs and symptoms of COVID-19 were discussed with the patient and how to seek care for testing (follow up with PCP or arrange E-visit).   The importance of social distancing was discussed today.  Time:   Today, I have spent 22 minutes with the patient with telehealth technology discussing the above problems.     Medication Adjustments/Labs and Tests Ordered: Current medicines are reviewed at length with the patient today.  Concerns regarding medicines are outlined above.  Medication Instructions:   Take PM dose of Hydralazine 1 (to 1 1/2) hour earlier Take Restoril @ ~10 PM - but be careful of combining Restoril with Ativan (alprazolam)  Tests Ordered: No  orders of the defined types were placed in this encounter.   Medication Changes: No orders of the defined types were placed in this encounter.   Disposition:  Follow up in 6 month(s)    Signed, Lindsey Hew, MD  04/23/2019 11:33 PM    [place]

## 2019-04-21 NOTE — Telephone Encounter (Addendum)
Contacted patient to discuss AVS Instructions. Gave her Dr Darcus Pester recommendations from today's virtual visit. Informed patient that someone from the scheduling dept will be contacting her to schedule her follow up appt. She voiced understanding and AVS mailed to patient.

## 2019-04-21 NOTE — Patient Instructions (Addendum)
Medication Instructions:  TAKE Hydralazine 1-1.5 hours earlier  TAKE Restoril daily at 10pm. Be careful taking this medication with Ativan (Lorazepam) because it can caue you to be groggy the next morning, take meds 2 hours apart If you need a refill on your cardiac medications before your next appointment, please call your pharmacy.   Lab work: None  If you have labs (blood work) drawn today and your tests are completely normal, you will receive your results only by: Marland Kitchen MyChart Message (if you have MyChart) OR . A paper copy in the mail If you have any lab test that is abnormal or we need to change your treatment, we will call you to review the results.  Testing/Procedures: None   Follow-Up: At [facility], you and your health needs are our priority.  As part of our continuing mission to provide you with exceptional heart care, we have created designated Provider Care Teams.  These Care Teams include your primary Cardiologist (physician) and Advanced Practice Providers (APPs -  Physician Assistants and Nurse Practitioners) who all work together to provide you with the care you need, when you need it. You will need a follow up appointment in 6 months.  Please call our office 2 months in advance to schedule this appointment.  You may see Glenetta Hew, MD or one of the following Advanced Practice Providers on your designated Care Team:   Rosaria Ferries, PA-C . Jory Sims, DNP, ANP  Any Other Special Instructions Will Be Listed Below (If Applicable).

## 2019-04-23 ENCOUNTER — Encounter: Payer: Self-pay | Admitting: Cardiology

## 2019-06-16 ENCOUNTER — Other Ambulatory Visit: Payer: Self-pay | Admitting: Cardiology

## 2019-06-16 NOTE — Telephone Encounter (Signed)
Please review for refill.  

## 2019-06-16 NOTE — Telephone Encounter (Signed)
20f 58.1kg Scr 1.6 07/08/18 lov harding 04/21/19

## 2019-06-21 ENCOUNTER — Ambulatory Visit (INDEPENDENT_AMBULATORY_CARE_PROVIDER_SITE_OTHER): Payer: Medicare Other | Admitting: *Deleted

## 2019-06-21 DIAGNOSIS — I4510 Unspecified right bundle-branch block: Secondary | ICD-10-CM

## 2019-06-21 DIAGNOSIS — I5032 Chronic diastolic (congestive) heart failure: Secondary | ICD-10-CM

## 2019-06-21 LAB — CUP PACEART REMOTE DEVICE CHECK
Battery Remaining Longevity: 65 mo
Battery Voltage: 3 V
Brady Statistic AP VP Percent: 0 %
Brady Statistic AP VS Percent: 0 %
Brady Statistic AS VP Percent: 0 %
Brady Statistic AS VS Percent: 0 %
Brady Statistic RA Percent Paced: 0 %
Brady Statistic RV Percent Paced: 99.87 %
Date Time Interrogation Session: 20200810160043
Implantable Lead Implant Date: 20160620
Implantable Lead Implant Date: 20160620
Implantable Lead Location: 753859
Implantable Lead Location: 753860
Implantable Lead Model: 5076
Implantable Lead Model: 5076
Implantable Pulse Generator Implant Date: 20160620
Lead Channel Impedance Value: 285 Ohm
Lead Channel Impedance Value: 342 Ohm
Lead Channel Impedance Value: 361 Ohm
Lead Channel Impedance Value: 437 Ohm
Lead Channel Pacing Threshold Amplitude: 0.75 V
Lead Channel Pacing Threshold Amplitude: 0.75 V
Lead Channel Pacing Threshold Pulse Width: 0.4 ms
Lead Channel Pacing Threshold Pulse Width: 0.4 ms
Lead Channel Sensing Intrinsic Amplitude: 0.375 mV
Lead Channel Sensing Intrinsic Amplitude: 0.75 mV
Lead Channel Sensing Intrinsic Amplitude: 9.75 mV
Lead Channel Sensing Intrinsic Amplitude: 9.75 mV
Lead Channel Setting Pacing Amplitude: 2 V
Lead Channel Setting Pacing Pulse Width: 0.4 ms
Lead Channel Setting Sensing Sensitivity: 4 mV

## 2019-06-22 ENCOUNTER — Other Ambulatory Visit: Payer: Self-pay | Admitting: Family Medicine

## 2019-06-23 ENCOUNTER — Telehealth: Payer: Self-pay | Admitting: Family Medicine

## 2019-06-23 NOTE — Telephone Encounter (Signed)
Caller name: Earnest Bailey  Relation to pt:  [facility] Call back number: [phone] ext  [phone]    Reason for call:  Requesting most recent office notes pertaining to why patient was referred and most recent labs please fax to  [phone]

## 2019-06-23 NOTE — Telephone Encounter (Signed)
See note

## 2019-06-25 NOTE — Telephone Encounter (Signed)
Pt called and stated that she would like dr fry to know that she has an appointment with urology on 06/29/19. FYI

## 2019-06-28 NOTE — Telephone Encounter (Signed)
Notes have been faxed. Nothing further needed.

## 2019-06-28 NOTE — Progress Notes (Signed)
Remote pacemaker transmission.   

## 2019-06-28 NOTE — Telephone Encounter (Signed)
Holly with [facility] called again to follow up on pt's office notes regarding incontinence and any recent lab cultures to be sent over. Pt is scheduled to be seen on tomorrow 06/29/19. FAX#  [phone]

## 2019-06-28 NOTE — Telephone Encounter (Signed)
Here are some notes to fax

## 2019-06-28 NOTE — Telephone Encounter (Signed)
Please advise. Which OV notes would you like for me to send?

## 2019-06-30 LAB — BASIC METABOLIC PANEL
BUN: 41 — AB (ref 4–21)
Creatinine: 1.7 — AB (ref 0.5–1.1)
Glucose: 96
Potassium: 4.6 (ref 3.4–5.3)
Sodium: 141 (ref 137–147)

## 2019-06-30 LAB — CBC AND DIFFERENTIAL: Hemoglobin: 11.5 — AB (ref 12.0–16.0)

## 2019-06-30 LAB — TSH: TSH: 1.36 (ref 0.41–5.90)

## 2019-07-06 ENCOUNTER — Encounter: Payer: Self-pay | Admitting: Family Medicine

## 2019-07-13 ENCOUNTER — Telehealth: Payer: Self-pay | Admitting: Family Medicine

## 2019-07-13 NOTE — Telephone Encounter (Signed)
Patient dropped off disability placard  Call patient to pick up form at: [phone]  Disposition: Dr's Folder

## 2019-07-14 NOTE — Telephone Encounter (Signed)
Forms have been placed in Dr. Barbie Banner red folder.

## 2019-07-15 ENCOUNTER — Other Ambulatory Visit: Payer: Self-pay | Admitting: Cardiology

## 2019-07-15 NOTE — Telephone Encounter (Signed)
33f 58.1kg Scr 1.7 06/30/19 Lovw/harding 04/21/19

## 2019-07-15 NOTE — Telephone Encounter (Signed)
Refill request

## 2019-07-15 NOTE — Telephone Encounter (Signed)
The form is ready  

## 2019-07-16 NOTE — Telephone Encounter (Signed)
Patient is aware. Form has been placed up front for pick up. 

## 2019-08-17 ENCOUNTER — Other Ambulatory Visit: Payer: Self-pay

## 2019-08-17 ENCOUNTER — Ambulatory Visit (INDEPENDENT_AMBULATORY_CARE_PROVIDER_SITE_OTHER): Payer: Medicare Other | Admitting: Internal Medicine

## 2019-08-17 ENCOUNTER — Encounter: Payer: Self-pay | Admitting: Internal Medicine

## 2019-08-17 VITALS — BP 160/78 | HR 82 | Ht 62.0 in | Wt 125.8 lb

## 2019-08-17 DIAGNOSIS — I495 Sick sinus syndrome: Secondary | ICD-10-CM | POA: Diagnosis not present

## 2019-08-17 DIAGNOSIS — I4821 Permanent atrial fibrillation: Secondary | ICD-10-CM

## 2019-08-17 DIAGNOSIS — Z950 Presence of cardiac pacemaker: Secondary | ICD-10-CM

## 2019-08-17 DIAGNOSIS — R600 Localized edema: Secondary | ICD-10-CM | POA: Diagnosis not present

## 2019-08-17 NOTE — Progress Notes (Signed)
Patient Care Team: [name], MD as PCP - General [name], MD as PCP - Cardiology (Cardiology)   HPI  [name] is a 85 y.o. female Seen in follow-up for pacemaker implanted 6/16 for syncope in the context of left bundle branch block sinus arrest and PAF.  She had had recurrent exacerbations of HFpEF     The patient denies chest pain, shortness of breath, nocturnal dyspnea, orthopnea -- some peripheral edema.  There have been no palpitations, lightheadedness or syncope.        Records and Results Reviewed   Echo EF 55-60 2014 DATE TEST    4/18    Echo   EF 55 % LAE (42/2.5/50)   5/18    Myoview   EF 55-60 % No ischemia        Date Cr Hgb  5/18 1.44 9.8  6/18   12  2/19 1.96 12.4  8/20 1.7 11.6     Past Medical History:  Diagnosis Date  . Allergy   . Anemia   . Anxiety   . Arthritis   . Asthma   . Benign positional vertigo   . Blood transfusion without reported diagnosis   . Cardiac pacemaker in situ 05/01/2015   for syncope & Sx Bradycardia -> Medtronic MRI compatible pacemaker placed Dr. Caryl Comes   . Cataract   . Chronic diarrhea   . Chronic kidney disease, stage IV (severe) ([name])   . Colon polyps   . Diastolic dysfunction, left ventricle 11/03/2013  . Diverticulosis   . Esophageal stricture   . GERD (gastroesophageal reflux disease)   . Gout   . Heart murmur   . Hemorrhoids   . Hyperlipidemia   . Hypertensive heart disease    no significant RAS by MRA 2002- (<30% LRAS)   . Hypothyroidism   . IBS (irritable bowel syndrome)   . LBBB (left bundle branch block)- new 11/01/13 11/01/2013  . Paroxysmal atrial fibrillation 11/01/2013   Intermittent through the years and recurrent in December of 2014 treated with amiodarone;; Negative Myoview 10/2013  . Peripheral vascular disease    123456 LICA, [id number] RICA by doppler 2009   . Sinus arrest 04/28/2015  . Syncope and collapse 04/28/2015   s/p PPM  . Tubulovillous adenoma 4/07     Past Surgical History:  Procedure Laterality Date  . APPENDECTOMY    . CATARACT EXTRACTION    . EP IMPLANTABLE DEVICE N/A 05/01/2015   Procedure: Pacemaker Implant;  Surgeon: Deboraha Sprang, MD;  Location: [facility];  Service: Cardiovascular;  Laterality: N/A;  . HIP ARTHROPLASTY Left 05/01/2015   Procedure: ARTHROPLASTY BIPOLAR HIP (HEMIARTHROPLASTY);  Surgeon: Rod Can, MD;  Location: [facility];  Service: Orthopedics;  Laterality: Left;  . LOW ANTERIOR BOWEL RESECTION  7/08  . Chicken SURGERY  2000  . NM MYOVIEW LTD  03/18/2013   Negative for ischemia or infarction. EF 55%.  LOW RISK  . OOPHORECTOMY  1962   right  . squamous cell skin cancer removed    . TRANSTHORACIC ECHOCARDIOGRAM  10/2013, 02/2017   A) EF 55-60%, mild LVH, elevated bili pressures. Mild aortic valve calcification;; B) Normal LV size and function with mild LVH. Unable to assess diastolic function. Normal PA pressures. Mild MR,Mild AI    Current Outpatient Medications  Medication Sig Dispense Refill  . acetaminophen (TYLENOL) 325 MG tablet Take 2 tablets (650 mg total) by mouth every 6 (six) hours as  needed for mild pain (or Fever >/= 101).    . [employer] VITAMIN B12 1000 MCG tablet TAKE 1 TABLET BY MOUTH EVERY DAY 90 tablet 3  . ELIQUIS 2.5 MG TABS tablet TAKE 1 TABLET BY MOUTH TWICE A DAY 180 tablet 1  . febuxostat (ULORIC) 40 MG tablet Take 1 tablet (40 mg total) by mouth every other day. 45 tablet 3  . furosemide (LASIX) 40 MG tablet Take 0.5 tablets (20 mg total) by mouth daily as needed. Take 40mg  only if more than 2-3 pounds overnight and/or 5 pounds in a week 90 tablet 3  . hydrALAZINE (APRESOLINE) 25 MG tablet Take 1 tablet (25 mg total) by mouth 2 (two) times daily. TAKE THIS 25 MG DOSE ALONG WITH YOUR 50 MG DOSE (75 MG TOTAL) TWICE DAILY. 270 tablet 3  . hydrALAZINE (APRESOLINE) 50 MG tablet Take 1 tablet (50 mg total) by mouth 2 (two) times daily. TAKE THIS 50 MG DOSE ALONG WITH YOUR 25 MG DOSE (75 MG  TOTAL) TWICE DAILY. 90 tablet 6  . isosorbide mononitrate (IMDUR) 30 MG 24 hr tablet Take 15 mg by mouth daily.    Marland Kitchen levothyroxine (SYNTHROID, LEVOTHROID) 88 MCG tablet TAKE 1 TABLET (88 MCG TOTAL) BY MOUTH DAILY BEFORE BREAKFAST. 90 tablet 3  . loperamide (IMODIUM) 2 MG capsule Take 2 mg by mouth as needed for diarrhea or loose stools.    Marland Kitchen LORazepam (ATIVAN) 0.5 MG tablet TAKE 1 TABLET BY MOUTH EVERY 8 HOURS AS NEEDED 90 tablet 5  . nitroGLYCERIN (NITROSTAT) 0.4 MG SL tablet Place 1 tablet (0.4 mg total) under the tongue every 5 (five) minutes x 3 doses as needed for chest pain. 25 tablet 3  . omeprazole (PRILOSEC) 40 MG capsule Take 1 capsule (40 mg total) by mouth 2 (two) times daily. 60 capsule 11  . Potassium Chloride ER 20 MEQ TBCR Take 20 mEq by mouth daily as needed. Take only if you are taking your Lasix that day. 90 tablet 3  . temazepam (RESTORIL) 30 MG capsule Take 15 mg by mouth at bedtime as needed for sleep.     No current facility-administered medications for this visit.     Allergies  Allergen Reactions  . Penicillins Swelling    REACTION: nausea, swelling  . Xifaxan [Rifaximin] Other (See Comments) and Rash    unknown  . Ambien [Zolpidem Tartrate]     " made her crazy "  . Amoxicillin     REACTION: unspecified  . Ceftin [Cefuroxime Axetil] Diarrhea  . Codeine Phosphate     REACTION: unspecified  . Colchicine     Severe diarrhea  . Levofloxacin Other (See Comments)    Tingle numbness  . Lipitor [Atorvastatin] Other (See Comments)    "makes legs jump all night"  . Amlodipine Other (See Comments)    LE edema      Review of Systems negative except from HPI and PMH  Physical Exam   BP (!) 160/78   Pulse 82   Ht 5\' 2"  (1.575 m)   Wt 125 lb 12.8 oz (57.1 kg)   BMI 23.01 kg/m   Well developed and well nourished in no acute distress HENT normal Neck supple with JVP-flat Clear Device pocket well healed; without hematoma or erythema.  There is no tethering   Regular rate and rhythm, no  murmur Abd-soft with active BS No Clubbing cyanosis   edema Skin-warm and dry A & Oriented  Grossly normal sensory and motor function  Sinus node dysfunction  Pacemaker- Medtronic The patient's device was interrogated.  The information was reviewed. No changes were made in the programming.    Nonsustained VT   Hypertension   Anemia  Complete heart block  Atrial fibrillation-permanent     Anemia better  HR controlled  BP mildly elevated    Tolerating afib without sypmptoms  Continue rate control     VT NS- new , last K 8/20 was normal, last Mg 2/19 was low--will recheck

## 2019-08-17 NOTE — Patient Instructions (Addendum)
Medication Instructions:  Your physician recommends that you continue on your current medications as directed. Please refer to the Current Medication list given to you today.  *If you need a refill on your cardiac medications before your next appointment, please call your pharmacy*  Labwork: None ordered  Testing/Procedures: None ordered  Follow-Up: Remote monitoring is used to monitor your Pacemaker or ICD from home. This monitoring reduces the number of office visits required to check your device to one time per year. It allows [place] to keep an eye on the functioning of your device to ensure it is working properly. You are scheduled for a device check from home on 09/20/2019. You may send your transmission at any time that day. If you have a wireless device, the transmission will be sent automatically. After your physician reviews your transmission, you will receive a postcard with your next transmission date.  Your physician wants you to follow-up in: 1 year with Dr. Caryl Comes.  You will receive a reminder letter in the mail two months in advance. If you don't receive a letter, please call our office to schedule the follow-up appointment.  Thank you for choosing [facility]!!

## 2019-08-18 ENCOUNTER — Telehealth: Payer: Self-pay

## 2019-08-18 DIAGNOSIS — Z79899 Other long term (current) drug therapy: Secondary | ICD-10-CM

## 2019-08-18 DIAGNOSIS — R600 Localized edema: Secondary | ICD-10-CM

## 2019-08-18 DIAGNOSIS — I495 Sick sinus syndrome: Secondary | ICD-10-CM

## 2019-08-18 DIAGNOSIS — I4821 Permanent atrial fibrillation: Secondary | ICD-10-CM

## 2019-08-18 DIAGNOSIS — I5032 Chronic diastolic (congestive) heart failure: Secondary | ICD-10-CM

## 2019-08-18 LAB — CUP PACEART INCLINIC DEVICE CHECK
Battery Remaining Longevity: 66 mo
Battery Voltage: 3 V
Brady Statistic AP VP Percent: 2.1 %
Brady Statistic AP VS Percent: 0 %
Brady Statistic AS VP Percent: 97.9 %
Brady Statistic AS VS Percent: 0 %
Brady Statistic RA Percent Paced: 1.51 %
Brady Statistic RV Percent Paced: 98.64 %
Date Time Interrogation Session: 20201006181232
Implantable Lead Implant Date: 20160620
Implantable Lead Implant Date: 20160620
Implantable Lead Location: 753859
Implantable Lead Location: 753860
Implantable Lead Model: 5076
Implantable Lead Model: 5076
Implantable Pulse Generator Implant Date: 20160620
Lead Channel Impedance Value: 285 Ohm
Lead Channel Impedance Value: 342 Ohm
Lead Channel Impedance Value: 361 Ohm
Lead Channel Impedance Value: 437 Ohm
Lead Channel Pacing Threshold Amplitude: 0.75 V
Lead Channel Pacing Threshold Amplitude: 0.75 V
Lead Channel Pacing Threshold Pulse Width: 0.4 ms
Lead Channel Pacing Threshold Pulse Width: 0.4 ms
Lead Channel Sensing Intrinsic Amplitude: 0.375 mV
Lead Channel Sensing Intrinsic Amplitude: 0.75 mV
Lead Channel Sensing Intrinsic Amplitude: 16.5 mV
Lead Channel Sensing Intrinsic Amplitude: 16.5 mV
Lead Channel Setting Pacing Amplitude: 2 V
Lead Channel Setting Pacing Pulse Width: 0.4 ms
Lead Channel Setting Sensing Sensitivity: 4 mV

## 2019-08-18 NOTE — Telephone Encounter (Signed)
LMTCB

## 2019-08-18 NOTE — Telephone Encounter (Signed)
-----  Message from Deboraha Sprang, MD sent at 08/18/2019  7:55 AM EDT ----- Hassell Halim  can you arrange for B MET and Mg on this lady SVP  Thanks SK

## 2019-08-18 NOTE — Telephone Encounter (Signed)
Spoke with the pt and she will come in for labs 08/24/19 per Dr. Olin Pia request.

## 2019-08-24 ENCOUNTER — Other Ambulatory Visit: Payer: Self-pay | Admitting: Cardiology

## 2019-08-24 ENCOUNTER — Other Ambulatory Visit: Payer: Self-pay

## 2019-08-24 ENCOUNTER — Other Ambulatory Visit: Payer: Self-pay | Admitting: Family Medicine

## 2019-08-24 ENCOUNTER — Other Ambulatory Visit: Payer: Medicare Other

## 2019-08-24 DIAGNOSIS — I4821 Permanent atrial fibrillation: Secondary | ICD-10-CM

## 2019-08-24 DIAGNOSIS — I495 Sick sinus syndrome: Secondary | ICD-10-CM

## 2019-08-24 DIAGNOSIS — Z79899 Other long term (current) drug therapy: Secondary | ICD-10-CM

## 2019-08-24 DIAGNOSIS — R600 Localized edema: Secondary | ICD-10-CM

## 2019-08-24 DIAGNOSIS — I5032 Chronic diastolic (congestive) heart failure: Secondary | ICD-10-CM

## 2019-08-25 ENCOUNTER — Other Ambulatory Visit: Payer: Self-pay | Admitting: Family Medicine

## 2019-08-25 LAB — BASIC METABOLIC PANEL WITH GFR
BUN/Creatinine Ratio: 20 (ref 12–28)
BUN: 34 mg/dL (ref 10–36)
CO2: 24 mmol/L (ref 20–29)
Calcium: 10 mg/dL (ref 8.7–10.3)
Chloride: 107 mmol/L — ABNORMAL HIGH (ref 96–106)
Creatinine, Ser: 1.66 mg/dL — ABNORMAL HIGH (ref 0.57–1.00)
GFR calc Af Amer: 31 mL/min/{1.73_m2} — ABNORMAL LOW
GFR calc non Af Amer: 27 mL/min/{1.73_m2} — ABNORMAL LOW
Glucose: 76 mg/dL (ref 65–99)
Potassium: 4.3 mmol/L (ref 3.5–5.2)
Sodium: 145 mmol/L — ABNORMAL HIGH (ref 134–144)

## 2019-08-25 LAB — MAGNESIUM: Magnesium: 1.4 mg/dL — ABNORMAL LOW (ref 1.6–2.3)

## 2019-08-26 NOTE — Telephone Encounter (Signed)
Okay for refill? Please advise 

## 2019-09-20 ENCOUNTER — Ambulatory Visit (INDEPENDENT_AMBULATORY_CARE_PROVIDER_SITE_OTHER): Payer: Medicare Other | Admitting: *Deleted

## 2019-09-20 DIAGNOSIS — R55 Syncope and collapse: Secondary | ICD-10-CM

## 2019-09-20 DIAGNOSIS — I447 Left bundle-branch block, unspecified: Secondary | ICD-10-CM

## 2019-09-21 ENCOUNTER — Ambulatory Visit: Payer: Medicare Other

## 2019-09-21 ENCOUNTER — Other Ambulatory Visit: Payer: Self-pay

## 2019-09-21 LAB — CUP PACEART REMOTE DEVICE CHECK
Battery Remaining Longevity: 67 mo
Battery Voltage: 3 V
Brady Statistic AP VP Percent: 0 %
Brady Statistic AP VS Percent: 0 %
Brady Statistic AS VP Percent: 0 %
Brady Statistic AS VS Percent: 0 %
Brady Statistic RA Percent Paced: 0 %
Brady Statistic RV Percent Paced: 99.7 %
Date Time Interrogation Session: 20201110004903
Implantable Lead Implant Date: 20160620
Implantable Lead Implant Date: 20160620
Implantable Lead Location: 753859
Implantable Lead Location: 753860
Implantable Lead Model: 5076
Implantable Lead Model: 5076
Implantable Pulse Generator Implant Date: 20160620
Lead Channel Impedance Value: 285 Ohm
Lead Channel Impedance Value: 342 Ohm
Lead Channel Impedance Value: 342 Ohm
Lead Channel Impedance Value: 418 Ohm
Lead Channel Pacing Threshold Amplitude: 0.625 V
Lead Channel Pacing Threshold Amplitude: 0.75 V
Lead Channel Pacing Threshold Pulse Width: 0.4 ms
Lead Channel Pacing Threshold Pulse Width: 0.4 ms
Lead Channel Sensing Intrinsic Amplitude: 0.375 mV
Lead Channel Sensing Intrinsic Amplitude: 0.75 mV
Lead Channel Sensing Intrinsic Amplitude: 14.5 mV
Lead Channel Sensing Intrinsic Amplitude: 14.5 mV
Lead Channel Setting Pacing Amplitude: 2 V
Lead Channel Setting Pacing Pulse Width: 0.4 ms
Lead Channel Setting Sensing Sensitivity: 4 mV

## 2019-10-16 NOTE — Progress Notes (Signed)
Remote pacemaker transmission.   

## 2019-10-18 ENCOUNTER — Telehealth: Payer: Self-pay | Admitting: Gastroenterology

## 2019-10-18 NOTE — Telephone Encounter (Signed)
Spoke to the patient who reports with several meals a week, after a few bites, she feels like a "lump" in her throat forms (she often has to stand over the sink to release everything that she reports was stuck in her throat). After this happens, she notices an increased production of mucus, then indigestion. She was last seen in April. Due to the frequency of these incidences and increased discomfort, she is wondering if she needs another dilation, or more reflux medications. She wanted Dr. Fuller Plan to be updated. The patient reported she had another doctors appointment today and if we returned her call and she did not answer to call her back. The patient was ensured we would call back.

## 2019-10-18 NOTE — Telephone Encounter (Signed)
Please schedule appt with me or APP to further assess. Continue omeprazole 40 mg po bid.

## 2019-10-18 NOTE — Telephone Encounter (Signed)
Spoke to patient who has been scheduled to see Dr. Fuller Plan in the office on 11/22/2019 at 2:30 pm.   The patient reported she is currently continuing to take Omeprazole 40 mg PO BID.

## 2019-10-27 ENCOUNTER — Other Ambulatory Visit: Payer: Self-pay | Admitting: Family Medicine

## 2019-10-27 NOTE — Telephone Encounter (Signed)
Last filled 08/30/2019 Last OV 12/29/2018  Ok to fill?

## 2019-10-28 ENCOUNTER — Other Ambulatory Visit: Payer: Self-pay

## 2019-10-28 ENCOUNTER — Ambulatory Visit: Payer: Medicare Other | Admitting: Cardiology

## 2019-10-28 ENCOUNTER — Encounter: Payer: Self-pay | Admitting: Cardiology

## 2019-10-28 VITALS — BP 178/70 | HR 65 | Temp 96.8°F | Ht 62.0 in | Wt 127.0 lb

## 2019-10-28 DIAGNOSIS — I447 Left bundle-branch block, unspecified: Secondary | ICD-10-CM | POA: Diagnosis not present

## 2019-10-28 DIAGNOSIS — I1310 Hypertensive heart and chronic kidney disease without heart failure, with stage 1 through stage 4 chronic kidney disease, or unspecified chronic kidney disease: Secondary | ICD-10-CM

## 2019-10-28 DIAGNOSIS — I4819 Other persistent atrial fibrillation: Secondary | ICD-10-CM | POA: Diagnosis not present

## 2019-10-28 DIAGNOSIS — N181 Chronic kidney disease, stage 1: Secondary | ICD-10-CM

## 2019-10-28 DIAGNOSIS — I10 Essential (primary) hypertension: Secondary | ICD-10-CM

## 2019-10-28 DIAGNOSIS — I5032 Chronic diastolic (congestive) heart failure: Secondary | ICD-10-CM

## 2019-10-28 DIAGNOSIS — N184 Chronic kidney disease, stage 4 (severe): Secondary | ICD-10-CM

## 2019-10-28 DIAGNOSIS — I130 Hypertensive heart and chronic kidney disease with heart failure and stage 1 through stage 4 chronic kidney disease, or unspecified chronic kidney disease: Secondary | ICD-10-CM

## 2019-10-28 MED ORDER — AMLODIPINE BESYLATE 2.5 MG PO TABS: 2.5000 mg | ORAL_TABLET | Freq: Every day | ORAL | 7 refills | Status: DC

## 2019-10-28 NOTE — Progress Notes (Signed)
Primary Care Provider: [name], MD Cardiologist: Glenetta Hew, MD Electrophysiologist:   Clinic Note: Chief Complaint  Patient presents with  . Follow-up    19-month, in person  . Atrial Fibrillation    No recurrent symptoms.  Has pacemaker in place.  . Hypertension    Still labile    HPI:    [name] is a 85 y.o. female with a PMH notable for longstanding A. fib (with tachybradycardia syndrome-complete heart block leading to syncope) status post PPM as well as HFpEF who presents today for 62-month follow-up.  Echo in April 2018 showed normal LVF, mild LVH, and severe LAE. Lexiscan in May 2018 was low risk.  Chronic Afib - no longer on Amiodarone (was having issues with swelling).   PPM on VVIR-was not doing well back in December 2019 CRI - sees Dr. [name] - > now trying to reestablish with a new Nephrologist. Also has chronic issues with esophageal stricture  [name] was last seen on April 21, 2019 via telemedicine.  She was dealing with a little bit of stress and anxiety with Covid.  Was having some weird chest discomfort symptoms.  She was also upset about the fact that her son and had CABG in the fall 2019.  She was anxious about Covid. --> No real active cardiac symptoms.  Minimal Lasix dosing.  No sensation of A. fib.  No bleeding on Eliquis. --> Decided to allow for permissive hypertension with a systolic blood pressure range of 100 4260 mmHg. --> Recommended  Take PM dose of Hydralazine 1 (to 1 1/2) hour earlier Take Restoril @ ~10 PM - but be careful of combining Restoril with Ativan (alprazolam)avoiding benzodiazepine plus Restoril.   Seen by Dr. Adam Phenix for pacemaker follow-up on August 17, 2019 --> she underwent pacemaker placement for syncope back in June 2016--in the context of left upper branch block and sinus arrest while in PAF. ->  Pacemaker was functioning well.  Recent Hospitalizations: N/A  Reviewed  CV studies:    The  following studies were reviewed today: (if available, images/films reviewed: From Epic Chart or Care Everywhere) . N/A:   Interval History:   [name] returns here today sort of an initiating mood.  She is having lots of stress and anxiety now family issues, recent GI issues having to have another esophageal dilation.  The COVID-19 situation and the elections/politics in general have got her very stressed out. She did mention that Dr. Caryl Comes recommended that she gets a stationary bike peddler to put in front of her while she is sitting in front of the TV.  She did get that is not using it routinely.  She is not noticing the dizziness and lightheadedness nearly as much she had, but her blood pressures have been up quite a bit.  She is not happy with how high the pressures are but also does not like it when they are low.  This morning her blood pressure was 192/85 and she was bit concerned about that she had a little bit of a headache and felt dizzy.  She has not had any syncope or near syncope episodes.  Has not had any heart failure symptoms of PND, orthopnea and has really minimal edema.  The last time she used Lasix was about a month ago.   CV Review of Symptoms (Summary): negative for - dyspnea on exertion, orthopnea, palpitations, paroxysmal nocturnal dyspnea, rapid heart rate, shortness of breath or Syncope/near syncope, TIA/amaurosis  fugax, claudication. She says she every now and then feels a heaviness in her chest at rest when she is anxious.  Also rare palpitations.  The patient does not have symptoms concerning for COVID-19 infection (fever, chills, cough, or new shortness of breath).  The patient is practicing social distancing. ++ Masking when she goes out but, but this is usually for appointments and very rarely for other things such as groceries etc.   REVIEWED OF SYSTEMS   A comprehensive ROS was performed. Review of Systems  Constitutional: Positive for malaise/fatigue  (Has been really stressed out, not sleeping right). Negative for weight loss.  HENT: Positive for sore throat (Just had another esophageal stricture dilation). Negative for congestion and nosebleeds.   Respiratory: Negative for cough, shortness of breath and wheezing.   Gastrointestinal: Positive for abdominal pain and heartburn (Heartburn and dyspepsia.). Negative for blood in stool, melena and nausea.       Had esophageal stricture stricture dilation back in February  Genitourinary: Negative for frequency and hematuria.  Musculoskeletal: Positive for back pain and joint pain.  Neurological: Negative for dizziness, tremors, loss of consciousness and weakness.  Psychiatric/Behavioral: Positive for memory loss. Negative for depression (Seems to be relatively decent spirits). The patient is nervous/anxious (Very anxious). The patient does not have insomnia (Intermittent some blood and some bad nights).   All other systems reviewed and are negative.   I have reviewed and (if needed) personally updated the patient's problem list, medications, allergies, past medical and surgical history, social and family history.   PAST MEDICAL HISTORY   Past Medical History:  Diagnosis Date  . Allergy   . Anemia   . Anxiety   . Arthritis   . Asthma   . Benign positional vertigo   . Blood transfusion without reported diagnosis   . Cardiac pacemaker in situ 05/01/2015   for syncope & Sx Bradycardia -> Medtronic MRI compatible pacemaker placed Dr. Caryl Comes   . Cataract   . Chronic diarrhea   . Chronic kidney disease, stage IV (severe) ([place])   . Colon polyps   . Diastolic dysfunction, left ventricle 11/03/2013  . Diverticulosis   . Esophageal stricture   . GERD (gastroesophageal reflux disease)   . Gout   . Heart murmur   . Hemorrhoids   . Hyperlipidemia   . Hypertensive heart disease    no significant RAS by MRA 2002- (<30% LRAS)   . Hypothyroidism   . IBS (irritable bowel syndrome)   . LBBB (left  bundle branch block)- new 11/01/13 11/01/2013  . Paroxysmal atrial fibrillation 11/01/2013   Intermittent through the years and recurrent in December of 2014 treated with amiodarone;; Negative Myoview 10/2013  . Peripheral vascular disease    123456 LICA, [id number] RICA by doppler 2009   . Sinus arrest 04/28/2015  . Syncope and collapse 04/28/2015   s/p PPM  . Tubulovillous adenoma 4/07     PAST SURGICAL HISTORY   Past Surgical History:  Procedure Laterality Date  . APPENDECTOMY    . CATARACT EXTRACTION    . EP IMPLANTABLE DEVICE N/A 05/01/2015   Procedure: Pacemaker Implant;  Surgeon: Deboraha Sprang, MD;  Location: [facility];  Service: Cardiovascular;  Laterality: N/A;  . HIP ARTHROPLASTY Left 05/01/2015   Procedure: ARTHROPLASTY BIPOLAR HIP (HEMIARTHROPLASTY);  Surgeon: Rod Can, MD;  Location: [facility];  Service: Orthopedics;  Laterality: Left;  . LOW ANTERIOR BOWEL RESECTION  7/08  . [place] SURGERY  2000  . NM  MYOVIEW LTD  03/18/2013   Negative for ischemia or infarction. EF 55%.  LOW RISK  . OOPHORECTOMY  1962   right  . squamous cell skin cancer removed    . TRANSTHORACIC ECHOCARDIOGRAM  10/2013, 02/2017   A) EF 55-60%, mild LVH, elevated bili pressures. Mild aortic valve calcification;; B) Normal LV size and function with mild LVH. Unable to assess diastolic function. Normal PA pressures. Mild MR,Mild AI     MEDICATIONS/ALLERGIES   Current Meds  Medication Sig  . acetaminophen (TYLENOL) 325 MG tablet Take 2 tablets (650 mg total) by mouth every 6 (six) hours as needed for mild pain (or Fever >/= 101).  . CVS VITAMIN B12 1000 MCG tablet TAKE 1 TABLET BY MOUTH EVERY DAY  . ELIQUIS 2.5 MG TABS tablet TAKE 1 TABLET BY MOUTH TWICE A DAY  . febuxostat (ULORIC) 40 MG tablet Take 1 tablet (40 mg total) by mouth every other day.  . furosemide (LASIX) 40 MG tablet Take 0.5 tablets (20 mg total) by mouth daily as needed. Take 40mg  only if more than 2-3 pounds overnight  and/or 5 pounds in a week  . hydrALAZINE (APRESOLINE) 25 MG tablet Take 1 tablet (25 mg total) by mouth 2 (two) times daily. TAKE THIS 25 MG DOSE ALONG WITH YOUR 50 MG DOSE (75 MG TOTAL) TWICE DAILY.  . hydrALAZINE (APRESOLINE) 50 MG tablet TAKE 1 TABLET (50 MG TOTAL) BY MOUTH 2 (TWO) TIMES DAILY. TAKE THIS 50 MG DOSE ALONG WITH YOUR 25 MG DOSE (75 MG TOTAL) TWICE DAILY.  Marland Kitchen levothyroxine (SYNTHROID) 88 MCG tablet TAKE 1 TABLET (88 MCG TOTAL) BY MOUTH DAILY BEFORE BREAKFAST.  Marland Kitchen loperamide (IMODIUM) 2 MG capsule Take 2 mg by mouth as needed for diarrhea or loose stools.  Marland Kitchen LORazepam (ATIVAN) 0.5 MG tablet TAKE 1 TABLET BY MOUTH EVERY 8 HOURS AS NEEDED  . nitroGLYCERIN (NITROSTAT) 0.4 MG SL tablet Place 1 tablet (0.4 mg total) under the tongue every 5 (five) minutes x 3 doses as needed for chest pain.  Marland Kitchen omeprazole (PRILOSEC) 20 MG capsule Take 20 mg by mouth daily.  . Potassium Chloride ER 20 MEQ TBCR Take 20 mEq by mouth daily as needed. Take only if you are taking your Lasix that day.  . temazepam (RESTORIL) 15 MG capsule TAKE 2 CAPSULES AT BEDTIME AS NEEDED FOR SLEEP  . [DISCONTINUED] isosorbide mononitrate (IMDUR) 30 MG 24 hr tablet Take 15 mg by mouth daily.  . [DISCONTINUED] omeprazole (PRILOSEC) 40 MG capsule Take 1 capsule (40 mg total) by mouth 2 (two) times daily.    Allergies  Allergen Reactions  . Penicillins Swelling    REACTION: nausea, swelling  . Xifaxan [Rifaximin] Other (See Comments) and Rash    unknown  . Ambien [Zolpidem Tartrate]     " made her crazy "  . Amoxicillin     REACTION: unspecified  . Ceftin [Cefuroxime Axetil] Diarrhea  . Codeine Phosphate     REACTION: unspecified  . Colchicine     Severe diarrhea  . Levofloxacin Other (See Comments)    Tingle numbness  . Lipitor [Atorvastatin] Other (See Comments)    "makes legs jump all night"     SOCIAL HISTORY/FAMILY HISTORY   Social History   Tobacco Use  . Smoking status: Never Smoker  . Smokeless  tobacco: Never Used  . Tobacco comment: never used tobacco  Substance Use Topics  . Alcohol use: No    Alcohol/week: 0.0 standard drinks  . Drug use:  No   Social History   Social History Narrative   She is a widowed mother of 2, one child is deceased. Grandmother of one. She appears to be working out routinely at [employer] using [employer]. She is otherwise quite active.   She does not drink alcohol, does not smoke.  She is retired from [employer].    Lives alone in a one story condo.    Family History family history includes Cancer (age of onset: 14) in her mother; Colon cancer in her daughter; Heart attack in her father and son; Heart disease in her brother; Lung cancer in her maternal grandfather; Stroke in her maternal grandmother and paternal grandmother.   OBJCTIVE -PE, EKG, labs   Wt Readings from Last 3 Encounters:  10/28/19 127 lb (57.6 kg)  08/17/19 125 lb 12.8 oz (57.1 kg)  04/21/19 125 lb 12.8 oz (57.1 kg)    Physical Exam: BP (!) 178/70 (BP Location: Left Arm, Patient Position: Sitting, Cuff Size: Normal)   Pulse 65   Temp (!) 96.8 F (36 C)   Ht 5\' 2"  (1.575 m)   Wt 127 lb (57.6 kg)   BMI 23.23 kg/m  Physical Exam  Constitutional: She is oriented to person, place, and time. She appears well-developed and well-nourished. No distress.  Well-groomed.  Healthy-appearing.  HENT:  Head: Normocephalic and atraumatic.  Eyes: EOM are normal.  Very irregular shaped pupils following her cataract surgery.  Neck: No hepatojugular reflux and no JVD present. Carotid bruit is not present. No tracheal deviation present. No thyromegaly present.  Cardiovascular: S1 normal.  Occasional extrasystoles are present. Exam reveals no gallop and no friction rub.  Murmur heard. High-pitched harsh crescendo-decrescendo early systolic murmur is present with a grade of 1/6 at the upper right sternal border radiating to the neck. Split S2  Pulmonary/Chest:  Effort normal and breath sounds normal. No respiratory distress. She has no wheezes. She has no rales.  Abdominal: Soft. Bowel sounds are normal. She exhibits no distension. There is no abdominal tenderness. There is no rebound.  Musculoskeletal:        General: No edema. Normal range of motion.     Cervical back: Normal range of motion.  Neurological: She is alert and oriented to person, place, and time.  Psychiatric: She has a normal mood and affect. Her behavior is normal. Judgment and thought content normal.  Seems a bit anxious  Vitals reviewed.    Adult ECG Report N/A  Recent Labs: She had some labs drawn in October 2020, no lipids.  CR 1.66, K+ 4.3.  TSH 1.36. Lab Results  Component Value Date   CHOL 159 11/02/2013   HDL 47 11/02/2013   LDLCALC 90 11/02/2013   LDLDIRECT 142.3 09/08/2013   TRIG 111 11/02/2013   CHOLHDL 3.4 11/02/2013   Lab Results  Component Value Date   CREATININE 1.66 (H) 08/24/2019   BUN 34 08/24/2019   NA 145 (H) 08/24/2019   K 4.3 08/24/2019   CL 107 (H) 08/24/2019   CO2 24 08/24/2019    ASSESSMENT/PLAN    Problem List Items Addressed This Visit    Persistent Paroxysmal atrial fibrillation: CHA2DS2-VASc Score 5, on Eliquis (Chronic)    She sounds like she is in sinus rhythm today.  Cannot really tell because her heart sounds are distant and she is split S2.  She is regardless asymptomatic in A. fib. Trying to avoid too much bradycardia which would cause her to be paced  more. Doing well with Eliquis and no bleeding issues.      Relevant Medications   amLODipine (NORVASC) 2.5 MG tablet   Hypertensive heart and chronic kidney disease with chronic diastolic congestive heart failure (HCC) (Chronic)    Currently think she is having maximally of any heart failure symptoms now.  Is on minimal dose of diuretic and no PND, orthopnea with minimal edema.      Relevant Medications   amLODipine (NORVASC) 2.5 MG tablet   Systolic hypertension,  isolated; difficult to control (Chronic)    Blood pressure again is high.  She says that sometimes is down as low as 120.  She brought up log of her pressures in their all over the place.  But none none are lower than 123456 systolic and most are over 150.  Again, we are holding off beta-blockers to avoid bradycardia.  She is on hydralazine for afterload reduction. Amlodipine was stopped because amount was listed as there being concern for edema. And I will think that is the reason to stop amlodipine.  I want to restart it at 2.5 mg daily.  We can watch for worsening swelling, but does not an allergy.  She can take a little more Lasix if necessary.       Relevant Medications   amLODipine (NORVASC) 2.5 MG tablet   LBBB (left bundle branch block)- new 11/01/13 (Chronic)    Chronic left bundle branch block.  Had a negative Myoview back in 2014.  Thankfully, her EF is remaining stable and she not having heart failure symptoms.      Relevant Medications   amLODipine (NORVASC) 2.5 MG tablet   Hypertensive heart/kidney disease w/chronic kidney disease stage IV ([place])    -10 is that she was given set up a new nephrologist.  Creatinine seems to be stable at 1.66. Probably would avoid ARB or ACE inhibitor therefore, I think we will go ahead and restart amlodipine.      Relevant Medications   amLODipine (NORVASC) 2.5 MG tablet      Use additional dose of hydralazine for systolic pressure greater than 170 mmHg.  Take 100 mg of hydralazine.   COVID-19 Education: The signs and symptoms of COVID-19 were discussed with the patient and how to seek care for testing (follow up with PCP or arrange E-visit).   The importance of social distancing was discussed today.  I spent a total of 19 minutes with the patient and chart review. >  50% of the time was spent in direct patient consultation.  Additional time spent with chart review (studies, outside notes, etc): 8 Total Time: 27 min   Current medicines  are reviewed at length with the patient today.  (+/- concerns) ? BP too high   Patient Instructions / Medication Changes & Studies & Tests Ordered   Patient Instructions  Medication Instructions:   Restart amlodipine 2.5 mg  take one tablet daily   *If you need a refill on your cardiac medications before your next appointment, please call your pharmacy*  Lab Work: Not needed  Testing/Procedures: Not needed  Follow-Up: At [facility], you and your health needs are our priority.  As part of our continuing mission to provide you with exceptional heart care, we have created designated Provider Care Teams.  These Care Teams include your primary Cardiologist (physician) and Advanced Practice Providers (APPs -  Physician Assistants and Nurse Practitioners) who all work together to provide you with the care you need, when you need it.  Your next appointment:   3 month(s) - with app 6 months with doctor  The format for your next appointment:   In Person  Provider:    Jory Sims, DNP, ANP- 3 month visit  Glenetta Hew ,MD - 6 month visit  Other Instructions    Studies Ordered:   No orders of the defined types were placed in this encounter.    Glenetta Hew, M.D., M.S. Interventional Cardiologist   Pager # [phone] Phone # [phone] [address]. [place], [place] [address]   Thank you for choosing Heartcare at [facility]!!

## 2019-10-28 NOTE — Patient Instructions (Signed)
Medication Instructions:   Restart amlodipine 2.5 mg  take one tablet daily   *If you need a refill on your cardiac medications before your next appointment, please call your pharmacy*  Lab Work: Not needed  Testing/Procedures: Not needed  Follow-Up: At [facility], you and your health needs are our priority.  As part of our continuing mission to provide you with exceptional heart care, we have created designated Provider Care Teams.  These Care Teams include your primary Cardiologist (physician) and Advanced Practice Providers (APPs -  Physician Assistants and Nurse Practitioners) who all work together to provide you with the care you need, when you need it.  Your next appointment:   3 month(s) - with app 6 months with doctor  The format for your next appointment:   In Person  Provider:    Jory Sims, DNP, ANP- 3 month visit  Glenetta Hew ,MD - 6 month visit  Other Instructions

## 2019-10-30 ENCOUNTER — Encounter: Payer: Self-pay | Admitting: Cardiology

## 2019-10-30 ENCOUNTER — Other Ambulatory Visit: Payer: Self-pay | Admitting: Cardiology

## 2019-10-30 NOTE — Assessment & Plan Note (Signed)
She sounds like she is in sinus rhythm today.  Cannot really tell because her heart sounds are distant and she is split S2.  She is regardless asymptomatic in A. fib. Trying to avoid too much bradycardia which would cause her to be paced more. Doing well with Eliquis and no bleeding issues.

## 2019-10-30 NOTE — Assessment & Plan Note (Signed)
-  10 is that she was given set up a new nephrologist.  Creatinine seems to be stable at 1.66. Probably would avoid ARB or ACE inhibitor therefore, I think we will go ahead and restart amlodipine.

## 2019-10-30 NOTE — Assessment & Plan Note (Signed)
Chronic left bundle branch block.  Had a negative Myoview back in 2014.  Thankfully, her EF is remaining stable and she not having heart failure symptoms.

## 2019-10-30 NOTE — Assessment & Plan Note (Signed)
Blood pressure again is high.  She says that sometimes is down as low as 120.  She brought up log of her pressures in their all over the place.  But none none are lower than 123456 systolic and most are over 150.  Again, we are holding off beta-blockers to avoid bradycardia.  She is on hydralazine for afterload reduction. Amlodipine was stopped because amount was listed as there being concern for edema. And I will think that is the reason to stop amlodipine.  I want to restart it at 2.5 mg daily.  We can watch for worsening swelling, but does not an allergy.  She can take a little more Lasix if necessary.

## 2019-10-30 NOTE — Assessment & Plan Note (Signed)
Currently think she is having maximally of any heart failure symptoms now.  Is on minimal dose of diuretic and no PND, orthopnea with minimal edema.

## 2019-10-31 NOTE — Progress Notes (Signed)
Thank you, will do.

## 2019-11-01 ENCOUNTER — Other Ambulatory Visit: Payer: Self-pay

## 2019-11-01 MED ORDER — OMEPRAZOLE 40 MG PO CPDR: 40.0000 mg | DELAYED_RELEASE_CAPSULE | Freq: Two times a day (BID) | ORAL | 1 refills | Status: DC

## 2019-11-19 ENCOUNTER — Telehealth: Payer: Self-pay | Admitting: Cardiology

## 2019-11-19 NOTE — Telephone Encounter (Signed)
We do recommend you get the Covid vaccine. However, if you have additional questions please call your Primary Care doctor to discuss if the Vaccine is right for you.  Cardiac medications (including blood thinners) should not deter anyone from being vaccinated and there is no need to hold any of those medications prior to vaccine administration.   If you have further questions or concerns about the vaccine process, please visit [email or web address].

## 2019-11-22 ENCOUNTER — Ambulatory Visit: Payer: Medicare PPO | Admitting: Gastroenterology

## 2019-11-22 ENCOUNTER — Encounter: Payer: Self-pay | Admitting: Gastroenterology

## 2019-11-22 ENCOUNTER — Telehealth: Payer: Self-pay

## 2019-11-22 VITALS — BP 170/72 | HR 72 | Temp 97.6°F | Ht 62.0 in | Wt 126.5 lb

## 2019-11-22 DIAGNOSIS — Z01818 Encounter for other preprocedural examination: Secondary | ICD-10-CM | POA: Diagnosis not present

## 2019-11-22 DIAGNOSIS — Z8719 Personal history of other diseases of the digestive system: Secondary | ICD-10-CM | POA: Diagnosis not present

## 2019-11-22 DIAGNOSIS — R1310 Dysphagia, unspecified: Secondary | ICD-10-CM | POA: Diagnosis not present

## 2019-11-22 DIAGNOSIS — K219 Gastro-esophageal reflux disease without esophagitis: Secondary | ICD-10-CM

## 2019-11-22 DIAGNOSIS — Z7901 Long term (current) use of anticoagulants: Secondary | ICD-10-CM

## 2019-11-22 NOTE — Telephone Encounter (Signed)
Please comment on your recommendations for holding Eliquis for upcoming EGD with dilation? Thank you!

## 2019-11-22 NOTE — Telephone Encounter (Signed)
Patient with diagnosis of afib on Eliquis for anticoagulation.    Procedure: EGD dilation     Date of procedure: 11/26/2019  CHADS2-VASc score of  6 (CHF, HTN, AGE, CAD, AGE, female)  CrCl 19 ml/min  Per office protocol, patient can hold Eliquis for 2 days prior to procedure.

## 2019-11-22 NOTE — Patient Instructions (Addendum)
You have been scheduled for an endoscopy. Please follow written instructions given to you at your visit today. If you use inhalers (even only as needed), please bring them with you on the day of your procedure.  Continue taking Miralax 1/2-1 scoop daily.   Thank you for choosing me and [facility].  Pricilla Riffle. Dagoberto Ligas., MD., [name]

## 2019-11-22 NOTE — Telephone Encounter (Signed)
   Primary Cardiologist: Glenetta Hew, MD  Chart reviewed as part of pre-operative protocol coverage. Patient recently seen by Dr. Ellyn Hack on 10/28/2019 at which time patient was doing OK from a cardiac standpoint. I called and spoke with patient today (11/22/2019) and sounds like she has is stable from a cardiac standpoint. She notes mild shortness of breath if she is "rushing around" but states that this is not new or worse than usual. No chest pain, palpitations, syncope, orthopnea, PND, or edema. She states she is able to walk around the grocery store with any problems. She also states she can climb a flight of steps if needed but does not due this often because she is nervous about falling. Given past medical history and time since last visit, based on ACC/AHA guidelines, [name] would be at acceptable risk for the planned procedure without further cardiovascular testing.   Per Pharmacy and office protocol, "patient can hold Eliquis for 2 days prior to procedure."  I will route this recommendation to the requesting party via Epic fax function and remove from pre-op pool.  Please call with questions.  Darreld Mclean, PA-C 11/22/2019, 5:06 PM

## 2019-11-22 NOTE — Telephone Encounter (Signed)
Left patient a message asking her to call back and ask to speak with the pre-op team.

## 2019-11-22 NOTE — Telephone Encounter (Signed)
[facility] Pre-operative Risk Assessment     Request for surgical clearance:     Endoscopy Procedure  What type of surgery is being performed?     EGD dilation   When is this surgery scheduled?     11/26/19  What type of clearance is required ?   Pharmacy  Are there any medications that need to be held prior to surgery and how long? Eliquis x 2 days  Practice name and name of physician performing surgery?      [facility]  What is your office phone and fax number?      Phone- [phone]  [phone]  Anesthesia type (None, local, MAC, general) ?       MAC

## 2019-11-22 NOTE — Progress Notes (Signed)
    History of Present Illness: This is a 86 year old female with difficulty swallowing several times per week.  She relates intermittent difficulties with food sticking when she eats solid food for a few months.  She often notes regurgitation, increased reflux symptoms during e[pisodes of dysphagia. Change in bowel function with more constipation, frequent pellet like stools, occasional urgency.   EGD dilation 11/2018 - Benign-appearing esophageal stenosis. Dilated. - Medium-sized hiatal hernia. - Non-bleeding erosive gastropathy. - Normal duodenal bulb and second portion of the duodenum.   Current Medications, Allergies, Past Medical History, Past Surgical History, Family History and Social History were reviewed in [employer] record.   Physical Exam: General: Well developed, well nourished, no acute distress Head: Normocephalic and atraumatic Eyes:  sclerae anicteric, EOMI Ears: Normal auditory acuity Mouth: No deformity or lesions Lungs: Clear throughout to auscultation Heart: Regular rate and rhythm; no murmurs, rubs or bruits Abdomen: Soft, non tender and non distended. No masses, hepatosplenomegaly or hernias noted. Normal Bowel sounds Rectal: Not done Musculoskeletal: Symmetrical with no gross deformities  Pulses:  Normal pulses noted Extremities: No clubbing, cyanosis, edema or deformities noted Neurological: Alert oriented x 4, grossly nonfocal Psychological:  Alert and cooperative. Normal mood and affect   Assessment and Recommendations:  1. Dysphagia, recurrent.  History of esophageal stricture status post dilation in January 2020. Suspect esophageal recurrent stricture.  Continue omeprazole 40 mg p.o. twice daily. Soft foods only, avoiding meat and bread until dilation.  Schedule EGD with dilation. The risks (including bleeding, perforation, infection, missed lesions, medication reactions and possible hospitalization or surgery if complications  occur), benefits, and alternatives to endoscopy with possible biopsy and possible dilation were discussed with the patient and they consent to proceed.   2.  Atrial fibrillation maintained on Eliquis. Hold Eliquis 2 days before procedure - will instruct when and how to resume after procedure. Low but real risk of cardiovascular event such as heart attack, stroke, embolism, thrombosis or ischemia/infarct of other organs off Eliquis explained and need to seek urgent help if this occurs. The patient consents to proceed. Will communicate by phone or EMR with patient's prescribing provider to confirm that holding Eliquis is reasonable in this case.   3. Constipation, change in bowel habits. Last colonoscopy in June 2012 with prior sigmoid colectomy and internal hemorrhoids noted. Miralax 1/2 scoop to 1 full scoop daily titrated for adequate BM daily.

## 2019-11-23 NOTE — Telephone Encounter (Signed)
Informed patient she can hold Eliquis 2 days prior to her procedure. Patient verbalized understanding. 

## 2019-11-24 ENCOUNTER — Ambulatory Visit (INDEPENDENT_AMBULATORY_CARE_PROVIDER_SITE_OTHER): Payer: Medicare PPO

## 2019-11-24 DIAGNOSIS — Z1159 Encounter for screening for other viral diseases: Secondary | ICD-10-CM

## 2019-11-25 LAB — SARS CORONAVIRUS 2 (TAT 6-24 HRS): SARS Coronavirus 2: NEGATIVE

## 2019-11-26 ENCOUNTER — Encounter: Payer: Self-pay | Admitting: Gastroenterology

## 2019-11-26 ENCOUNTER — Ambulatory Visit (AMBULATORY_SURGERY_CENTER): Payer: Medicare PPO | Admitting: Gastroenterology

## 2019-11-26 ENCOUNTER — Other Ambulatory Visit: Payer: Self-pay

## 2019-11-26 VITALS — BP 146/65 | HR 60 | Temp 97.7°F | Resp 15 | Ht 62.0 in | Wt 126.0 lb

## 2019-11-26 DIAGNOSIS — K449 Diaphragmatic hernia without obstruction or gangrene: Secondary | ICD-10-CM

## 2019-11-26 DIAGNOSIS — R1310 Dysphagia, unspecified: Secondary | ICD-10-CM | POA: Diagnosis not present

## 2019-11-26 DIAGNOSIS — K222 Esophageal obstruction: Secondary | ICD-10-CM

## 2019-11-26 DIAGNOSIS — K219 Gastro-esophageal reflux disease without esophagitis: Secondary | ICD-10-CM

## 2019-11-26 DIAGNOSIS — N184 Chronic kidney disease, stage 4 (severe): Secondary | ICD-10-CM | POA: Diagnosis not present

## 2019-11-26 DIAGNOSIS — J449 Chronic obstructive pulmonary disease, unspecified: Secondary | ICD-10-CM | POA: Diagnosis not present

## 2019-11-26 MED ORDER — SODIUM CHLORIDE 0.9 % IV SOLN: 500.0000 mL | Freq: Once | INTRAVENOUS | Status: DC

## 2019-11-26 NOTE — Progress Notes (Signed)
Report given to PACU, vss 

## 2019-11-26 NOTE — Patient Instructions (Signed)
Please read handouts provided. Clear liquid diet for 2 hours, Then advance as tolerated to soft diet today. Resume prior diet tomorrow. Antireflux measures long term. Continue present medications. Return to GI office in 6 weeks. Resume Eliquis ( apixaban ) at prior dose in 2 days.         YOU HAD AN ENDOSCOPIC PROCEDURE TODAY AT [facility]:   Refer to the procedure report that was given to you for any specific questions about what was found during the examination.  If the procedure report does not answer your questions, please call your gastroenterologist to clarify.  If you requested that your care partner not be given the details of your procedure findings, then the procedure report has been included in a sealed envelope for you to review at your convenience later.  YOU SHOULD EXPECT: Some feelings of bloating in the abdomen. Passage of more gas than usual.  Walking can help get rid of the air that was put into your GI tract during the procedure and reduce the bloating. If you had a lower endoscopy (such as a colonoscopy or flexible sigmoidoscopy) you may notice spotting of blood in your stool or on the toilet paper. If you underwent a bowel prep for your procedure, you may not have a normal bowel movement for a few days.  Please Note:  You might notice some irritation and congestion in your nose or some drainage.  This is from the oxygen used during your procedure.  There is no need for concern and it should clear up in a day or so.  SYMPTOMS TO REPORT IMMEDIATELY:     Following upper endoscopy (EGD)  Vomiting of blood or coffee ground material  New chest pain or pain under the shoulder blades  Painful or persistently difficult swallowing  New shortness of breath  Fever of 100F or higher  Black, tarry-looking stools  For urgent or emergent issues, a gastroenterologist can be reached at any hour by calling [phone].   DIET:   Drink plenty of fluids but  you should avoid alcoholic beverages for 24 hours.  ACTIVITY:  You should plan to take it easy for the rest of today and you should NOT DRIVE or use heavy machinery until tomorrow (because of the sedation medicines used during the test).    FOLLOW UP: Our staff will call the number listed on your records 48-72 hours following your procedure to check on you and address any questions or concerns that you may have regarding the information given to you following your procedure. If we do not reach you, we will leave a message.  We will attempt to reach you two times.  During this call, we will ask if you have developed any symptoms of COVID 19. If you develop any symptoms (ie: fever, flu-like symptoms, shortness of breath, cough etc.) before then, please call [phone].  If you test positive for Covid 19 in the 2 weeks post procedure, please call and report this information to [place].    If any biopsies were taken you will be contacted by phone or by letter within the next 1-3 weeks.  Please call us at [phone] if you have not heard about the biopsies in 3 weeks.    SIGNATURES/CONFIDENTIALITY: You and/or your care partner have signed paperwork which will be entered into your electronic medical record.  These signatures attest to the fact that that the information above on your After Visit Summary has been reviewed and is  understood.  Full responsibility of the confidentiality of this discharge information lies with you and/or your care-partner. 

## 2019-11-26 NOTE — Progress Notes (Signed)
Called to room to assist during endoscopic procedure.  Patient ID and intended procedure confirmed with present staff. Received instructions for my participation in the procedure from the performing physician.  

## 2019-11-26 NOTE — Op Note (Signed)
[facility] Patient Name: [name] Procedure Date: 11/26/2019 11:20 AM MRN: [id number] Endoscopist: Ladene Artist , MD Age: 86 Referring MD:  Date of Birth: 05/23/1924 Gender: Female Account #: [id number] Procedure:                Upper GI endoscopy Indications:              Dysphagia Medicines:                Monitored Anesthesia Care Procedure:                Pre-Anesthesia Assessment:                           - Prior to the procedure, a History and Physical                            was performed, and patient medications and                            allergies were reviewed. The patient's tolerance of                            previous anesthesia was also reviewed. The risks                            and benefits of the procedure and the sedation                            options and risks were discussed with the patient.                            All questions were answered, and informed consent                            was obtained. Prior Anticoagulants: The patient has                            taken Eliquis (apixaban), last dose was 2 days                            prior to procedure. ASA Grade Assessment: III - A                            patient with severe systemic disease. After                            reviewing the risks and benefits, the patient was                            deemed in satisfactory condition to undergo the                            procedure.  After obtaining informed consent, the endoscope was                            passed under direct vision. Throughout the                            procedure, the patient's blood pressure, pulse, and                            oxygen saturations were monitored continuously. The                            Endoscope was introduced through the mouth, and                            advanced to the second part of duodenum. The upper                            GI  endoscopy was accomplished without difficulty.                            The patient tolerated the procedure well. Scope In: Scope Out: Findings:                 One benign-appearing, intrinsic moderate stenosis                            was found 36 cm from the incisors. This stenosis                            measured 1.2 cm (inner diameter) x less than one cm                            (in length). The stenosis was traversed. A                            guidewire was placed and the scope was withdrawn.                            Dilation was performed with a Savary dilator with                            no resistance at 13 mm. A guidewire was placed and                            the scope was withdrawn. Dilation was performed                            with a Savary dilator with mild resistance at 15 mm.                           The exam of the esophagus was otherwise normal.  A medium-sized hiatal hernia was present.                           The exam of the stomach was otherwise normal.                           The duodenal bulb and second portion of the                            duodenum were normal. Complications:            No immediate complications. Estimated Blood Loss:     Estimated blood loss: none. Impression:               - Benign-appearing esophageal stenosis. Dilated.                           - Medium-sized hiatal hernia.                           - Normal duodenal bulb and second portion of the                            duodenum.                           - No specimens collected. Recommendation:           - Patient has a contact number available for                            emergencies. The signs and symptoms of potential                            delayed complications were discussed with the                            patient. Return to normal activities tomorrow.                            Written discharge instructions were  provided to the                            patient.                           - Clear liquid diet for 2 hours, then advance as                            tolerated to soft diet today.                           - Resume prior diet tomorrow.                           - Antireflux measures long term.                           -  Continue present medications.                           - Return to GI office in 6 weeks.                           - Resume Eliquis (apixaban) at prior dose in 2                            days. Refer to managing physician for further                            adjustment of therapy. Ladene Artist, MD 11/26/2019 11:32:01 AM This report has been signed electronically.

## 2019-11-30 ENCOUNTER — Telehealth: Payer: Self-pay | Admitting: *Deleted

## 2019-11-30 NOTE — Telephone Encounter (Signed)
  Follow up Call-  Call back number 11/26/2019 12/09/2018  Post procedure Call Back phone  # [phone]  Permission to leave phone message Yes Yes  Some recent data might be hidden     Patient questions:  Do you have a fever, pain , or abdominal swelling? No. Pain Score  0 *  Have you tolerated food without any problems? Yes.    Have you been able to return to your normal activities? Yes.    Do you have any questions about your discharge instructions: Diet   No. Medications  No. Follow up visit  No.  Do you have questions or concerns about your Care? No.  Actions: * If pain score is 4 or above: No action needed, pain <4.  1. Have you developed a fever since your procedure? no  2.   Have you had an respiratory symptoms (SOB or cough) since your procedure? no  3.   Have you tested positive for COVID 19 since your procedure no  4.   Have you had any family members/close contacts diagnosed with the COVID 19 since your procedure?  no   If yes to any of these questions please route to Joylene John, RN and Alphonsa Gin, Therapist, sports.

## 2019-12-09 ENCOUNTER — Encounter: Payer: Self-pay | Admitting: Family Medicine

## 2019-12-10 MED ORDER — LORAZEPAM 0.5 MG PO TABS: 0.5000 mg | ORAL_TABLET | Freq: Three times a day (TID) | ORAL | 5 refills | Status: DC | PRN

## 2019-12-10 NOTE — Telephone Encounter (Signed)
Done

## 2019-12-10 NOTE — Telephone Encounter (Signed)
Last filled 12/21/2018 Last OV 12/29/2018 (acute)  Ok to fill?

## 2019-12-11 NOTE — Telephone Encounter (Signed)
MyChart documentation:  Glenetta Hew, MD   Sure we can do potassium chloride 20 mEq == > we will give you 14-day supply with 3 refills.  Glenetta Hew, MD  ===View-only below this line===   ----- Message -----    From: [name]    Sent: 12/09/2019  9:20 PM EST      To: Glenetta Hew, MD Subject: Non-Urgent Medical Question  The Potassium pills that I was prescribed with my fluid pills has expired as well as the script.  Would you call in a small supply as I do not take these daily and do not want to waste them.    I have had to take some fluid pills and need to make sure that I take the potassium your previous instructions.  Thank you so much

## 2019-12-13 MED ORDER — POTASSIUM CHLORIDE ER 20 MEQ PO TBCR: 20.0000 meq | EXTENDED_RELEASE_TABLET | Freq: Every day | ORAL | 3 refills | Status: DC | PRN

## 2019-12-13 NOTE — Telephone Encounter (Signed)
E-sent prescription and sent message by [facility]

## 2019-12-14 ENCOUNTER — Other Ambulatory Visit: Payer: Self-pay | Admitting: Cardiology

## 2019-12-14 ENCOUNTER — Other Ambulatory Visit: Payer: Self-pay | Admitting: Family Medicine

## 2019-12-20 ENCOUNTER — Ambulatory Visit (INDEPENDENT_AMBULATORY_CARE_PROVIDER_SITE_OTHER): Payer: Medicare PPO | Admitting: *Deleted

## 2019-12-20 DIAGNOSIS — R55 Syncope and collapse: Secondary | ICD-10-CM | POA: Diagnosis not present

## 2019-12-21 LAB — CUP PACEART REMOTE DEVICE CHECK
Battery Remaining Longevity: 60 mo
Battery Voltage: 2.99 V
Brady Statistic AP VP Percent: 0 %
Brady Statistic AP VS Percent: 0 %
Brady Statistic AS VP Percent: 0 %
Brady Statistic AS VS Percent: 0 %
Brady Statistic RA Percent Paced: 0 %
Brady Statistic RV Percent Paced: 99.83 %
Date Time Interrogation Session: 20210209112847
Implantable Lead Implant Date: 20160620
Implantable Lead Implant Date: 20160620
Implantable Lead Location: 753859
Implantable Lead Location: 753860
Implantable Lead Model: 5076
Implantable Lead Model: 5076
Implantable Pulse Generator Implant Date: 20160620
Lead Channel Impedance Value: 285 Ohm
Lead Channel Impedance Value: 342 Ohm
Lead Channel Impedance Value: 361 Ohm
Lead Channel Impedance Value: 437 Ohm
Lead Channel Pacing Threshold Amplitude: 0.625 V
Lead Channel Pacing Threshold Amplitude: 0.75 V
Lead Channel Pacing Threshold Pulse Width: 0.4 ms
Lead Channel Pacing Threshold Pulse Width: 0.4 ms
Lead Channel Sensing Intrinsic Amplitude: 0.375 mV
Lead Channel Sensing Intrinsic Amplitude: 0.75 mV
Lead Channel Sensing Intrinsic Amplitude: 15.5 mV
Lead Channel Sensing Intrinsic Amplitude: 15.5 mV
Lead Channel Setting Pacing Amplitude: 2 V
Lead Channel Setting Pacing Pulse Width: 0.4 ms
Lead Channel Setting Sensing Sensitivity: 4 mV

## 2019-12-21 NOTE — Progress Notes (Signed)
PPM Remote  

## 2020-01-10 ENCOUNTER — Telehealth: Payer: Self-pay | Admitting: Cardiology

## 2020-01-10 NOTE — Telephone Encounter (Signed)
Thank you! ° °KL °

## 2020-01-10 NOTE — Telephone Encounter (Signed)
OV 3/3-routed to [facility].

## 2020-01-10 NOTE — Telephone Encounter (Signed)
Patient calling to give [name] the BP readings ahead of time before her appt on 01/12/20 at 1:45pm.  Saturday 2-27 2pm 109/54 HR 61 2:15pm 101/55 HR 68 3:30pnm 137/66 HR 69 8:30pm 148/71 HR 80  Sunday 2-28 10:45am 153/76 HR 72

## 2020-01-11 NOTE — Progress Notes (Signed)
Virtual Visit via Telephone Note   This visit type was conducted due to national recommendations for restrictions regarding the COVID-19 Pandemic (e.g. social distancing) in an effort to limit this patient's exposure and mitigate transmission in our community.  Due to her co-morbid illnesses, this patient is at least at moderate risk for complications without adequate follow up.  This format is felt to be most appropriate for this patient at this time.  The patient did not have access to video technology/had technical difficulties with video requiring transitioning to audio format only (telephone).  All issues noted in this document were discussed and addressed.  No physical exam could be performed with this format.  Please refer to the patient's chart for her  consent to telehealth for [facility].   Date:  01/12/2020   ID:  [name], DOB 1924-02-13, MRN [id number]  Patient Location: Home Provider Location: Office  PCP:  [name], MD  Cardiologist:  Glenetta Hew, MD Electrophysiologist:  None   Evaluation Performed:  Follow-Up Visit  Chief Complaint: Hypertension  History of Present Illness:    [name] is a 86 y.o. female with we are following for persistent longstanding atrial fib. She also has a history of tachybrady syndrome s/p PPM as well as HFpEF. Last echocardiogram 02/2017 showed normal LVEF, mild LVH, and severe LAE. Low risk Lexiscan was low risk. She was continued on current medication regimen with the exception of adding back low dose amlodipine 2.5 mg, and no further testing was planned.   She was asked to take her BP recordings and document them. She called our office to report her readings.   Saturday 2-27 2pm 109/54 HR 61 2:15pm 101/55 HR 68 3:30pnm 137/66 HR 69 8:30pm 148/71 HR 80  Sunday 2-28 10:45am 153/76 HR 72  She continues to have a lot of arthritis pain in her back and shoulders. She has a lot of other multiple complaints with her  health.  She speaks at length about all of these.  The patient have symptoms concerning for COVID-19 infection (fever, chills, cough, or new shortness breath).  She has had both COVID vaccines.   Past Medical History:  Diagnosis Date  . Allergy   . Anemia   . Anxiety   . Arthritis   . Asthma   . Benign positional vertigo   . Blood transfusion without reported diagnosis   . Cardiac pacemaker in situ 05/01/2015   for syncope & Sx Bradycardia -> Medtronic MRI compatible pacemaker placed Dr. Caryl Comes   . Cataract   . Chronic diarrhea   . Chronic kidney disease, stage IV (severe) ([place])   . Colon polyps   . Diastolic dysfunction, left ventricle 11/03/2013  . Diverticulosis   . Esophageal stricture   . GERD (gastroesophageal reflux disease)   . Gout   . Heart murmur   . Hemorrhoids   . Hyperlipidemia   . Hypertensive heart disease    no significant RAS by MRA 2002- (<30% LRAS)   . Hypothyroidism   . IBS (irritable bowel syndrome)   . LBBB (left bundle branch block)- new 11/01/13 11/01/2013  . Paroxysmal atrial fibrillation 11/01/2013   Intermittent through the years and recurrent in December of 2014 treated with amiodarone;; Negative Myoview 10/2013  . Peripheral vascular disease    123456 LICA, [id number] RICA by doppler 2009   . Sinus arrest 04/28/2015  . Syncope and collapse 04/28/2015   s/p PPM  . Tubulovillous adenoma 4/07   Past  Surgical History:  Procedure Laterality Date  . APPENDECTOMY    . CATARACT EXTRACTION    . EP IMPLANTABLE DEVICE N/A 05/01/2015   Procedure: Pacemaker Implant;  Surgeon: Deboraha Sprang, MD;  Location: [facility];  Service: Cardiovascular;  Laterality: N/A;  . HIP ARTHROPLASTY Left 05/01/2015   Procedure: ARTHROPLASTY BIPOLAR HIP (HEMIARTHROPLASTY);  Surgeon: Rod Can, MD;  Location: [facility];  Service: Orthopedics;  Laterality: Left;  . LOW ANTERIOR BOWEL RESECTION  7/08  . [place] SURGERY  2000  . NM MYOVIEW LTD  03/18/2013   Negative  for ischemia or infarction. EF 55%.  LOW RISK  . OOPHORECTOMY  1962   right  . squamous cell skin cancer removed    . TRANSTHORACIC ECHOCARDIOGRAM  10/2013, 02/2017   A) EF 55-60%, mild LVH, elevated bili pressures. Mild aortic valve calcification;; B) Normal LV size and function with mild LVH. Unable to assess diastolic function. Normal PA pressures. Mild MR,Mild AI     Current Meds  Medication Sig  . acetaminophen (TYLENOL) 325 MG tablet Take 2 tablets (650 mg total) by mouth every 6 (six) hours as needed for mild pain (or Fever >/= 101).  Marland Kitchen amLODipine (NORVASC) 2.5 MG tablet Take 1 tablet (2.5 mg total) by mouth daily.  . CVS VITAMIN B12 1000 MCG tablet TAKE 1 TABLET BY MOUTH EVERY DAY  . ELIQUIS 2.5 MG TABS tablet TAKE 1 TABLET BY MOUTH TWICE A DAY  . febuxostat (ULORIC) 40 MG tablet TAKE 1 TABLET BY MOUTH EVERY OTHER DAY  . furosemide (LASIX) 40 MG tablet Take 0.5 tablets (20 mg total) by mouth daily as needed. Take 40mg  only if more than 2-3 pounds overnight and/or 5 pounds in a week  . hydrALAZINE (APRESOLINE) 25 MG tablet TAKE 1 TABLET (25 MG TOTAL) BY MOUTH 2 (TWO) TIMES DAILY. TAKE THIS 25 MG DOSE ALONG WITH YOUR 50 MG DOSE (75 MG TOTAL) TWICE DAILY.  . hydrALAZINE (APRESOLINE) 50 MG tablet TAKE 1 TABLET (50 MG TOTAL) BY MOUTH 2 (TWO) TIMES DAILY. TAKE THIS 50 MG DOSE ALONG WITH YOUR 25 MG DOSE (75 MG TOTAL) TWICE DAILY.  Marland Kitchen levothyroxine (SYNTHROID) 88 MCG tablet TAKE 1 TABLET (88 MCG TOTAL) BY MOUTH DAILY BEFORE BREAKFAST.  Marland Kitchen loperamide (IMODIUM) 2 MG capsule Take 2 mg by mouth as needed for diarrhea or loose stools.  Marland Kitchen LORazepam (ATIVAN) 0.5 MG tablet Take 1 tablet (0.5 mg total) by mouth every 8 (eight) hours as needed.  . nitroGLYCERIN (NITROSTAT) 0.4 MG SL tablet Place 1 tablet (0.4 mg total) under the tongue every 5 (five) minutes x 3 doses as needed for chest pain.  Marland Kitchen omeprazole (PRILOSEC) 40 MG capsule Take 1 capsule (40 mg total) by mouth 2 (two) times daily. NEEDS APPT IN  APRIL  . Potassium Chloride ER 20 MEQ TBCR Take 20 mEq by mouth daily as needed. Take only if you are taking your Lasix that day.  . temazepam (RESTORIL) 15 MG capsule TAKE 2 CAPSULES AT BEDTIME AS NEEDED FOR SLEEP     Allergies:   Penicillins, Xifaxan [rifaximin], Ambien [zolpidem tartrate], Amoxicillin, Ceftin [cefuroxime axetil], Codeine phosphate, Colchicine, Levofloxacin, and Lipitor [atorvastatin]   Social History   Tobacco Use  . Smoking status: Never Smoker  . Smokeless tobacco: Never Used  . Tobacco comment: never used tobacco  Substance Use Topics  . Alcohol use: No    Alcohol/week: 0.0 standard drinks  . Drug use: No     Family Hx:  The patient's family history includes Cancer (age of onset: 66) in her mother; Colon cancer in her daughter; Heart attack in her father and son; Heart disease in her brother; Lung cancer in her maternal grandfather; Stroke in her maternal grandmother and paternal grandmother. There is no history of Esophageal cancer, Rectal cancer, or Stomach cancer.  ROS:   Please see the history of present illness.    All other systems reviewed and are negative.   Prior CV studies:   The following studies were reviewed today: Echocardiogram 03/06/2017  Left ventricle: The cavity size was normal. Wall thickness was  increased in a pattern of mild LVH. Systolic function was normal.  Wall motion was normal; there were no regional wall motion  abnormalities. The study is not technically sufficient to allow  evaluation of LV diastolic function.  - Aortic valve: Transvalvular velocity was within the normal range.  There was no stenosis. There was mild regurgitation.  - Mitral valve: Transvalvular velocity was within the normal range.  There was no evidence for stenosis. There was mild regurgitation.  - Left atrium: The atrium was severely dilated.  - Right ventricle: The cavity size was normal. Wall thickness was  normal. Systolic function was  normal.  - Tricuspid valve: There was mild regurgitation.  - Pulmonary arteries: Systolic pressure was within the normal  range. PA peak pressure: 30 mm Hg (S).   Labs/Other Tests and Data Reviewed:    EKG:  No ECG reviewed.  Recent Labs: 06/30/2019: Hemoglobin 11.5; TSH 1.36 08/24/2019: BUN 34; Creatinine, Ser 1.66; Magnesium 1.4; Potassium 4.3; Sodium 145   Recent Lipid Panel Lab Results  Component Value Date/Time   CHOL 159 11/02/2013 04:40 AM   TRIG 111 11/02/2013 04:40 AM   HDL 47 11/02/2013 04:40 AM   CHOLHDL 3.4 11/02/2013 04:40 AM   LDLCALC 90 11/02/2013 04:40 AM   LDLDIRECT 142.3 09/08/2013 11:47 AM    Wt Readings from Last 3 Encounters:  01/12/20 125 lb (56.7 kg)  11/26/19 126 lb (57.2 kg)  11/22/19 126 lb 8 oz (57.4 kg)     Objective:    Vital Signs:  BP (!) 152/73   Pulse 75   Ht 5\' 2"  (1.575 m)   Wt 125 lb (56.7 kg)   BMI 22.86 kg/m    VITAL SIGNS:  reviewed. Limited due to telephone encounter  ASSESSMENT & PLAN:    1. Hypertension: Blood pressure has been labile more often related to anxiety and musculoskeletal pain which she is suffering from pretty prominently.  She has significant back pain and shoulder pain with general lysed pain from arthritis.  This is causing her a good bit of difficulty and blood pressure is reflective of this.  I have advised her that if her blood pressure remains elevated above 160/70, she can take an additional 25 mg of hydralazine x1, and continue her current regimen of 75 mg of hydralazine twice daily, amlodipine 2.5 mg daily.  She is only to take the additional dose if her blood pressure remains elevated for several hours.  As her blood pressure is extremely labile I do not wish to have her take it more often during the day then as needed concerning extra dosing.  She is also advised not to take her blood pressure several times a day.  She verbalizes understanding  2.  Tachybradycardia syndrome: She does have a pacemaker  in situ she is to continue remote transmissions per protocol.  3.  Chronic pain syndrome: Severe pain  from arthritis in her back and shoulders along with musculoskeletal pain.  She is to follow-up with her primary care physician and orthopedist.  They may need to consider referral to pain management center if they feel that this is clinically warranted and beneficial for her treatment.  COVID-19 Education: The signs and symptoms of COVID-19 were discussed with the patient and how to seek care for testing (follow up with PCP or arrange E-visit).  The importance of social distancing was discussed today.  As stated above she has received both of her Covid vaccines and remains vigilant concerning social distancing.  Time:   Today, I have spent 30 minutes with the patient with telehealth technology discussing the above problems.     Medication Adjustments/Labs and Tests Ordered: Current medicines are reviewed at length with the patient today.  Concerns regarding medicines are outlined above.   Tests Ordered: No orders of the defined types were placed in this encounter.   Medication Changes: No orders of the defined types were placed in this encounter.   Disposition:  Follow up 6 months with Dr. Ellyn Hack  Signed, Phill Myron. West Pugh, ANP, AACC  01/12/2020 1:57 PM    [facility]

## 2020-01-12 ENCOUNTER — Telehealth (INDEPENDENT_AMBULATORY_CARE_PROVIDER_SITE_OTHER): Payer: Medicare PPO | Admitting: Adult Health

## 2020-01-12 ENCOUNTER — Encounter: Payer: Self-pay | Admitting: Adult Health

## 2020-01-12 VITALS — BP 152/73 | HR 75 | Ht 62.0 in | Wt 125.0 lb

## 2020-01-12 DIAGNOSIS — N184 Chronic kidney disease, stage 4 (severe): Secondary | ICD-10-CM

## 2020-01-12 DIAGNOSIS — G8929 Other chronic pain: Secondary | ICD-10-CM

## 2020-01-12 DIAGNOSIS — I10 Essential (primary) hypertension: Secondary | ICD-10-CM | POA: Diagnosis not present

## 2020-01-12 DIAGNOSIS — I5032 Chronic diastolic (congestive) heart failure: Secondary | ICD-10-CM

## 2020-01-12 DIAGNOSIS — Z950 Presence of cardiac pacemaker: Secondary | ICD-10-CM

## 2020-01-12 NOTE — Patient Instructions (Signed)
Medication Instructions:  Continue current medications  *If you need a refill on your cardiac medications before your next appointment, please call your pharmacy*   Lab Work: None Ordered  Testing/Procedures: None ordered  Follow-Up: At [facility], you and your health needs are our priority.  As part of our continuing mission to provide you with exceptional heart care, we have created designated Provider Care Teams.  These Care Teams include your primary Cardiologist (physician) and Advanced Practice Providers (APPs -  Physician Assistants and Nurse Practitioners) who all work together to provide you with the care you need, when you need it.  We recommend signing up for the patient portal called "MyChart".  Sign up information is provided on this After Visit Summary.  MyChart is used to connect with patients for Virtual Visits (Telemedicine).  Patients are able to view lab/test results, encounter notes, upcoming appointments, etc.  Non-urgent messages can be sent to your provider as well.   To learn more about what you can do with MyChart, go to [email or web address].    Your next appointment:   6 month(s)  The format for your next appointment:   In Person  Provider:   You may see Glenetta Hew, MD or one of the following Advanced Practice Providers on your designated Care Team:    Rosaria Ferries, PA-C  Jory Sims, DNP, ANP  Cadence Kathlen Mody, NP

## 2020-01-19 MED ORDER — FUROSEMIDE 40 MG PO TABS: 20.0000 mg | ORAL_TABLET | Freq: Every day | ORAL | 0 refills | Status: DC | PRN

## 2020-01-19 NOTE — Telephone Encounter (Signed)
Returned call to pt she states that her Furosemide is our of date and would like to have a refill. She states that she has not taken this for quite a while and had to take 1/2 tab yesterday and noticed that it was expired. She states that she had very little swelling but c/o SOB. She states that the SOB resolved after taking 1/2 tab. Refill sent as requested. She will notify us if anything needed in the future.

## 2020-01-23 ENCOUNTER — Other Ambulatory Visit: Payer: Self-pay | Admitting: Cardiology

## 2020-01-24 DIAGNOSIS — Z961 Presence of intraocular lens: Secondary | ICD-10-CM | POA: Diagnosis not present

## 2020-01-24 DIAGNOSIS — H40022 Open angle with borderline findings, high risk, left eye: Secondary | ICD-10-CM | POA: Diagnosis not present

## 2020-01-25 DIAGNOSIS — D485 Neoplasm of uncertain behavior of skin: Secondary | ICD-10-CM | POA: Diagnosis not present

## 2020-01-25 DIAGNOSIS — C44729 Squamous cell carcinoma of skin of left lower limb, including hip: Secondary | ICD-10-CM | POA: Diagnosis not present

## 2020-01-25 DIAGNOSIS — D0472 Carcinoma in situ of skin of left lower limb, including hip: Secondary | ICD-10-CM | POA: Diagnosis not present

## 2020-01-25 DIAGNOSIS — L570 Actinic keratosis: Secondary | ICD-10-CM | POA: Diagnosis not present

## 2020-01-25 DIAGNOSIS — C44722 Squamous cell carcinoma of skin of right lower limb, including hip: Secondary | ICD-10-CM | POA: Diagnosis not present

## 2020-01-25 DIAGNOSIS — L821 Other seborrheic keratosis: Secondary | ICD-10-CM | POA: Diagnosis not present

## 2020-01-25 DIAGNOSIS — L820 Inflamed seborrheic keratosis: Secondary | ICD-10-CM | POA: Diagnosis not present

## 2020-01-25 DIAGNOSIS — C44329 Squamous cell carcinoma of skin of other parts of face: Secondary | ICD-10-CM | POA: Diagnosis not present

## 2020-01-25 DIAGNOSIS — Z85828 Personal history of other malignant neoplasm of skin: Secondary | ICD-10-CM | POA: Diagnosis not present

## 2020-02-09 DIAGNOSIS — M25551 Pain in right hip: Secondary | ICD-10-CM | POA: Diagnosis not present

## 2020-02-09 DIAGNOSIS — M25511 Pain in right shoulder: Secondary | ICD-10-CM | POA: Diagnosis not present

## 2020-02-12 ENCOUNTER — Other Ambulatory Visit: Payer: Self-pay | Admitting: Cardiology

## 2020-02-16 ENCOUNTER — Other Ambulatory Visit: Payer: Self-pay | Admitting: Cardiology

## 2020-02-16 ENCOUNTER — Other Ambulatory Visit: Payer: Self-pay | Admitting: Gastroenterology

## 2020-02-18 NOTE — Telephone Encounter (Signed)
Increase quantity

## 2020-03-06 ENCOUNTER — Encounter: Payer: Self-pay | Admitting: Gastroenterology

## 2020-03-06 ENCOUNTER — Ambulatory Visit: Payer: Medicare PPO | Admitting: Gastroenterology

## 2020-03-06 ENCOUNTER — Other Ambulatory Visit: Payer: Self-pay | Admitting: Cardiology

## 2020-03-06 VITALS — BP 150/66 | HR 86 | Temp 97.7°F | Ht 62.0 in | Wt 123.4 lb

## 2020-03-06 DIAGNOSIS — K5900 Constipation, unspecified: Secondary | ICD-10-CM | POA: Diagnosis not present

## 2020-03-06 DIAGNOSIS — K222 Esophageal obstruction: Secondary | ICD-10-CM | POA: Diagnosis not present

## 2020-03-06 MED ORDER — OMEPRAZOLE 40 MG PO CPDR: 40.0000 mg | DELAYED_RELEASE_CAPSULE | Freq: Two times a day (BID) | ORAL | 11 refills | Status: DC

## 2020-03-06 NOTE — Patient Instructions (Addendum)
We have sent the following medications to your pharmacy for you to pick up at your convenience: omeprazole 40 mg twice daily.   Take over the counter Miralax 1/2-1 scoop daily.   Thank you for choosing me and [facility].  Pricilla Riffle. Dagoberto Ligas., MD., [name]

## 2020-03-06 NOTE — Progress Notes (Signed)
    History of Present Illness: This is a 86 year old female with GERD and a recurrent esophageal stricture.  She relates significant improvement in dysphagia following dilation although she occasionally notes transient solid food dysphagia with certain foods.  She takes Tums as needed for breakthrough reflux symptoms.  She is not clear whether she is taking omeprazole 20 mg or 40 mg twice daily.  She notes difficulty with clearing phlegm from her throat and a frequent sensation of lump in the throat she has problems with constipation alternating with diarrhea.  MiraLAX however she has ongoing symptoms.   EGD 11/2019 - Benign-appearing esophageal stenosis. Dilated to 15 mm. - Medium-sized hiatal hernia. - Normal duodenal bulb and second portion of the duodenum. - No specimens collected.  Current Medications, Allergies, Past Medical History, Past Surgical History, Family History and Social History were reviewed in [employer] record.   Physical Exam: General: Well developed, well nourished, no acute distress Head: Normocephalic and atraumatic Eyes:  sclerae anicteric, EOMI Ears: Normal auditory acuity Mouth: Not examined, mask on during Covid-19 pandemic Lungs: Clear throughout to auscultation Heart: Regular rate and rhythm; no murmurs, rubs or bruits Abdomen: Soft, non tender and non distended. No masses, hepatosplenomegaly or hernias noted. Normal Bowel sounds Rectal: Not done Musculoskeletal: Symmetrical with no gross deformities  Pulses:  Normal pulses noted Extremities: No clubbing, cyanosis, edema or deformities noted Neurological: Alert oriented x 4, grossly nonfocal Psychological:  Alert and cooperative. Anxious   Assessment and Recommendations:  1. GERD. Recurrent esophageal stricture.  Dysphagia significantly improved following dilation in January.  She is advised to either avoid certain foods or cut into small pieces.  If dysphagia worsens consider  repeat EGD with dilation.  She would like to avoid EGD, dilation, sedation given her age and comorbidities if at all possible which is very reasonable.  Globus sensation and difficulty clearing secretions.  Follow antireflux measures. Continue omeprazole 40 mg po bid long term.  Tums 4 times daily as needed.  REV in 1 year.   2. Constipation alternating diarrhea.  Occasional abdominal pain.  Difficult to manage bowel function.  Maintain adequate hydration.  Avoid foods that trigger symptoms.  Titrate Miralax 1/2 capful to 2 capfuls daily as needed.  Hold for diarrhea.  Hyoscyamine 1-2 every 4 hours as needed for abdominal pain.  We discussed the option of colonoscopy for further evaluation however she would like to avoid colonoscopy, sedation given her age and comorbidities which is very reasonable.

## 2020-03-08 DIAGNOSIS — M5136 Other intervertebral disc degeneration, lumbar region: Secondary | ICD-10-CM | POA: Diagnosis not present

## 2020-03-08 DIAGNOSIS — M5412 Radiculopathy, cervical region: Secondary | ICD-10-CM | POA: Diagnosis not present

## 2020-03-08 DIAGNOSIS — M47896 Other spondylosis, lumbar region: Secondary | ICD-10-CM | POA: Diagnosis not present

## 2020-03-20 ENCOUNTER — Ambulatory Visit (INDEPENDENT_AMBULATORY_CARE_PROVIDER_SITE_OTHER): Payer: Medicare PPO | Admitting: *Deleted

## 2020-03-20 ENCOUNTER — Other Ambulatory Visit: Payer: Self-pay

## 2020-03-20 DIAGNOSIS — I442 Atrioventricular block, complete: Secondary | ICD-10-CM | POA: Diagnosis not present

## 2020-03-20 LAB — CUP PACEART REMOTE DEVICE CHECK
Battery Remaining Longevity: 61 mo
Battery Voltage: 2.99 V
Brady Statistic AP VP Percent: 0 %
Brady Statistic AP VS Percent: 0 %
Brady Statistic AS VP Percent: 0 %
Brady Statistic AS VS Percent: 0 %
Brady Statistic RA Percent Paced: 0 %
Brady Statistic RV Percent Paced: 99.28 %
Date Time Interrogation Session: 20210510122932
Implantable Lead Implant Date: 20160620
Implantable Lead Implant Date: 20160620
Implantable Lead Location: 753859
Implantable Lead Location: 753860
Implantable Lead Model: 5076
Implantable Lead Model: 5076
Implantable Pulse Generator Implant Date: 20160620
Lead Channel Impedance Value: 285 Ohm
Lead Channel Impedance Value: 342 Ohm
Lead Channel Impedance Value: 342 Ohm
Lead Channel Impedance Value: 418 Ohm
Lead Channel Pacing Threshold Amplitude: 0.75 V
Lead Channel Pacing Threshold Amplitude: 0.75 V
Lead Channel Pacing Threshold Pulse Width: 0.4 ms
Lead Channel Pacing Threshold Pulse Width: 0.4 ms
Lead Channel Sensing Intrinsic Amplitude: 0.375 mV
Lead Channel Sensing Intrinsic Amplitude: 0.75 mV
Lead Channel Sensing Intrinsic Amplitude: 5.125 mV
Lead Channel Sensing Intrinsic Amplitude: 5.125 mV
Lead Channel Setting Pacing Amplitude: 2 V
Lead Channel Setting Pacing Pulse Width: 0.4 ms
Lead Channel Setting Sensing Sensitivity: 4 mV

## 2020-03-21 ENCOUNTER — Encounter: Payer: Self-pay | Admitting: Family Medicine

## 2020-03-21 ENCOUNTER — Ambulatory Visit (INDEPENDENT_AMBULATORY_CARE_PROVIDER_SITE_OTHER): Payer: Medicare PPO | Admitting: Family Medicine

## 2020-03-21 VITALS — BP 150/60 | HR 90 | Temp 97.6°F | Wt 128.0 lb

## 2020-03-21 DIAGNOSIS — N181 Chronic kidney disease, stage 1: Secondary | ICD-10-CM | POA: Diagnosis not present

## 2020-03-21 DIAGNOSIS — I5032 Chronic diastolic (congestive) heart failure: Secondary | ICD-10-CM

## 2020-03-21 DIAGNOSIS — R600 Localized edema: Secondary | ICD-10-CM | POA: Diagnosis not present

## 2020-03-21 DIAGNOSIS — N184 Chronic kidney disease, stage 4 (severe): Secondary | ICD-10-CM

## 2020-03-21 DIAGNOSIS — I130 Hypertensive heart and chronic kidney disease with heart failure and stage 1 through stage 4 chronic kidney disease, or unspecified chronic kidney disease: Secondary | ICD-10-CM | POA: Diagnosis not present

## 2020-03-21 LAB — BASIC METABOLIC PANEL
BUN: 37 mg/dL — ABNORMAL HIGH (ref 6–23)
CO2: 26 mEq/L (ref 19–32)
Calcium: 9.1 mg/dL (ref 8.4–10.5)
Chloride: 107 mEq/L (ref 96–112)
Creatinine, Ser: 1.38 mg/dL — ABNORMAL HIGH (ref 0.40–1.20)
GFR: 35.64 mL/min — ABNORMAL LOW (ref >60.00)
Glucose, Bld: 92 mg/dL (ref 70–99)
Potassium: 4.9 mEq/L (ref 3.5–5.1)
Sodium: 139 mEq/L (ref 135–145)

## 2020-03-21 LAB — BRAIN NATRIURETIC PEPTIDE: Pro B Natriuretic peptide (BNP): 402 pg/mL — ABNORMAL HIGH (ref 0.0–100.0)

## 2020-03-21 LAB — TSH: TSH: 1.1 u[IU]/mL (ref 0.35–4.50)

## 2020-03-21 LAB — CBC WITH DIFFERENTIAL/PLATELET
Basophils Absolute: 0 10*3/uL (ref 0.0–0.1)
Basophils Relative: 0.8 % (ref 0.0–3.0)
Eosinophils Absolute: 0 10*3/uL (ref 0.0–0.7)
Eosinophils Relative: 0.7 % (ref 0.0–5.0)
HCT: 33.7 % — ABNORMAL LOW (ref 36.0–46.0)
Hemoglobin: 11.1 g/dL — ABNORMAL LOW (ref 12.0–15.0)
Lymphocytes Relative: 23.4 % (ref 12.0–46.0)
Lymphs Abs: 0.9 10*3/uL (ref 0.7–4.0)
MCHC: 32.9 g/dL (ref 30.0–36.0)
MCV: 98.7 fl (ref 78.0–100.0)
Monocytes Absolute: 0.4 10*3/uL (ref 0.1–1.0)
Monocytes Relative: 10.7 % (ref 3.0–12.0)
Neutro Abs: 2.4 10*3/uL (ref 1.4–7.7)
Neutrophils Relative %: 64.4 % (ref 43.0–77.0)
Platelets: 158 10*3/uL (ref 150.0–400.0)
RBC: 3.42 Mil/uL — ABNORMAL LOW (ref 3.87–5.11)
RDW: 14.6 % (ref 11.5–15.5)
WBC: 3.7 10*3/uL — ABNORMAL LOW (ref 4.0–10.5)

## 2020-03-21 NOTE — Progress Notes (Signed)
Remote pacemaker transmission.   

## 2020-03-21 NOTE — Progress Notes (Signed)
   Subjective:    Patient ID: [name], female    DOB: 11-14-1923, 86 y.o.   MRN: [id number]  HPI Here to discuss swelling in her legs. She has had this to varying degrees for years, but lately it seems to have increased. There is no discomfort. No chest pain or SOB. Her last ECHO in April 2018 showed normal systolic function. Her BP has been stable at home from 140-160 over 80-90. She has been taking her Lasix every other day. Of note she recently had 4 squamous cell cancers removed from the lower legs by Dr. Ubaldo Glassing. She is scheduled for a lumbar epidural steroid injection per Dr, Nelva Bush on 03-30-20.    Review of Systems  Constitutional: Negative.   Respiratory: Negative.   Cardiovascular: Positive for leg swelling. Negative for chest pain and palpitations.  Neurological: Negative.        Objective:   Physical Exam Constitutional:      Comments: Frail, she walks stooped over a the waist   Cardiovascular:     Rate and Rhythm: Normal rate and regular rhythm.     Pulses: Normal pulses.     Heart sounds: Normal heart sounds.  Pulmonary:     Effort: Pulmonary effort is normal.     Breath sounds: Normal breath sounds.  Musculoskeletal:     Comments: 2+ edema in both lower legs   Neurological:     Mental Status: She is alert.           Assessment & Plan:  I think a contributing factor to her leg swelling is the Amlodipine, so I asked her to stop this completely. Get labs today including a BMET and a BNP. Stay on Lasix every other day.  [name], MD

## 2020-03-27 ENCOUNTER — Telehealth (HOSPITAL_COMMUNITY): Payer: Self-pay | Admitting: Emergency Medicine

## 2020-03-27 ENCOUNTER — Emergency Department (HOSPITAL_COMMUNITY): Payer: Medicare PPO

## 2020-03-27 ENCOUNTER — Emergency Department (HOSPITAL_COMMUNITY)
Admission: EM | Admit: 2020-03-27 | Discharge: 2020-03-27 | Disposition: A | Discharge status: Home / Self Care | Payer: Medicare PPO | Attending: Emergency Medicine | Admitting: Emergency Medicine

## 2020-03-27 DIAGNOSIS — M545 Low back pain, unspecified: Secondary | ICD-10-CM

## 2020-03-27 DIAGNOSIS — Z79899 Other long term (current) drug therapy: Secondary | ICD-10-CM | POA: Insufficient documentation

## 2020-03-27 DIAGNOSIS — N184 Chronic kidney disease, stage 4 (severe): Secondary | ICD-10-CM | POA: Diagnosis not present

## 2020-03-27 DIAGNOSIS — E039 Hypothyroidism, unspecified: Secondary | ICD-10-CM | POA: Insufficient documentation

## 2020-03-27 DIAGNOSIS — M5489 Other dorsalgia: Secondary | ICD-10-CM | POA: Diagnosis not present

## 2020-03-27 DIAGNOSIS — I130 Hypertensive heart and chronic kidney disease with heart failure and stage 1 through stage 4 chronic kidney disease, or unspecified chronic kidney disease: Secondary | ICD-10-CM | POA: Diagnosis not present

## 2020-03-27 DIAGNOSIS — N390 Urinary tract infection, site not specified: Secondary | ICD-10-CM

## 2020-03-27 DIAGNOSIS — I5032 Chronic diastolic (congestive) heart failure: Secondary | ICD-10-CM | POA: Insufficient documentation

## 2020-03-27 DIAGNOSIS — I10 Essential (primary) hypertension: Secondary | ICD-10-CM | POA: Diagnosis not present

## 2020-03-27 DIAGNOSIS — M25551 Pain in right hip: Secondary | ICD-10-CM | POA: Diagnosis not present

## 2020-03-27 DIAGNOSIS — Z7901 Long term (current) use of anticoagulants: Secondary | ICD-10-CM | POA: Diagnosis not present

## 2020-03-27 DIAGNOSIS — M4185 Other forms of scoliosis, thoracolumbar region: Secondary | ICD-10-CM | POA: Diagnosis not present

## 2020-03-27 DIAGNOSIS — J449 Chronic obstructive pulmonary disease, unspecified: Secondary | ICD-10-CM | POA: Diagnosis not present

## 2020-03-27 DIAGNOSIS — R52 Pain, unspecified: Secondary | ICD-10-CM | POA: Diagnosis not present

## 2020-03-27 LAB — CBC WITH DIFFERENTIAL/PLATELET
Abs Immature Granulocytes: 0.04 10*3/uL (ref 0.00–0.07)
Basophils Absolute: 0 10*3/uL (ref 0.0–0.1)
Basophils Relative: 0 %
Eosinophils Absolute: 0 10*3/uL (ref 0.0–0.5)
Eosinophils Relative: 0 %
HCT: 34.7 % — ABNORMAL LOW (ref 36.0–46.0)
Hemoglobin: 10.8 g/dL — ABNORMAL LOW (ref 12.0–15.0)
Immature Granulocytes: 1 %
Lymphocytes Relative: 7 %
Lymphs Abs: 0.5 10*3/uL — ABNORMAL LOW (ref 0.7–4.0)
MCH: 31.3 pg (ref 26.0–34.0)
MCHC: 31.1 g/dL (ref 30.0–36.0)
MCV: 100.6 fL — ABNORMAL HIGH (ref 80.0–100.0)
Monocytes Absolute: 0.7 10*3/uL (ref 0.1–1.0)
Monocytes Relative: 9 %
Neutro Abs: 5.9 10*3/uL (ref 1.7–7.7)
Neutrophils Relative %: 83 %
Platelets: 152 10*3/uL (ref 150–400)
RBC: 3.45 MIL/uL — ABNORMAL LOW (ref 3.87–5.11)
RDW: 13.9 % (ref 11.5–15.5)
WBC: 7.2 10*3/uL (ref 4.0–10.5)
nRBC: 0 % (ref 0.0–0.2)

## 2020-03-27 LAB — URINALYSIS, ROUTINE W REFLEX MICROSCOPIC
Bilirubin Urine: NEGATIVE
Glucose, UA: NEGATIVE mg/dL
Ketones, ur: NEGATIVE mg/dL
Nitrite: POSITIVE — AB
Protein, ur: 100 mg/dL — AB
Specific Gravity, Urine: 1.014 (ref 1.005–1.030)
WBC, UA: >50 WBC/hpf — ABNORMAL HIGH (ref 0–5)
pH: 5 (ref 5.0–8.0)

## 2020-03-27 LAB — BASIC METABOLIC PANEL
Anion gap: 10 (ref 5–15)
BUN: 28 mg/dL — ABNORMAL HIGH (ref 8–23)
CO2: 23 mmol/L (ref 22–32)
Calcium: 8.9 mg/dL (ref 8.9–10.3)
Chloride: 105 mmol/L (ref 98–111)
Creatinine, Ser: 1.43 mg/dL — ABNORMAL HIGH (ref 0.44–1.00)
GFR calc Af Amer: 36 mL/min — ABNORMAL LOW (ref >60)
GFR calc non Af Amer: 31 mL/min — ABNORMAL LOW (ref >60)
Glucose, Bld: 112 mg/dL — ABNORMAL HIGH (ref 70–99)
Potassium: 4.6 mmol/L (ref 3.5–5.1)
Sodium: 138 mmol/L (ref 135–145)

## 2020-03-27 MED ORDER — SULFAMETHOXAZOLE-TRIMETHOPRIM 800-160 MG PO TABS
1.0000 | ORAL_TABLET | Freq: Once | ORAL | Status: AC
  Administered 2020-03-27: 1 via ORAL
  Filled 2020-03-27: qty 1

## 2020-03-27 MED ORDER — FENTANYL CITRATE (PF) 100 MCG/2ML IJ SOLN
50.0000 ug | Freq: Once | INTRAMUSCULAR | Status: AC
  Administered 2020-03-27: 50 ug via INTRAVENOUS
  Filled 2020-03-27: qty 2

## 2020-03-27 MED ORDER — HYDROCODONE-ACETAMINOPHEN 5-325 MG PO TABS: 1.0000 | ORAL_TABLET | Freq: Four times a day (QID) | ORAL | 0 refills | Status: DC | PRN

## 2020-03-27 MED ORDER — SULFAMETHOXAZOLE-TRIMETHOPRIM 800-160 MG PO TABS: 1.0000 | ORAL_TABLET | Freq: Two times a day (BID) | ORAL | 0 refills | Status: AC

## 2020-03-27 MED ORDER — HYDROCODONE-ACETAMINOPHEN 5-325 MG PO TABS
1.0000 | ORAL_TABLET | Freq: Once | ORAL | Status: AC
  Administered 2020-03-27: 1 via ORAL
  Filled 2020-03-27: qty 1

## 2020-03-27 NOTE — Discharge Instructions (Signed)
You are seen in the emergency department for a few days of worsening low back pain.  Your x-ray showed a lot of arthritis in your back.  Your urinalysis also showed signs of infection and we are placing you on antibiotics.  Please stop your tramadol.  We are starting you on hydrocodone which may make you feel dizzy nauseous and make you constipated.  Please use care with using the medication.  Follow-up with your back doctor and your primary care doctor.  Return to the emergency department for any worsening or concerning symptoms

## 2020-03-27 NOTE — ED Provider Notes (Signed)
[name] EMERGENCY DEPARTMENT Provider Note   CSN: [id number] Arrival date & time: 03/27/20  0272     History Chief Complaint  Patient presents with  . Back Pain    [name] is a 86 y.o. female.  She lives alone uses a walker still drives.  Has chronic back pain and saw Dr. Nelva Bush recently and is supposed to get an injection later this week.  Back pain is usually across the whole low back and radiates into both groins.  For the last 3 days she has had increased pain in her right buttock that radiates into the right groin.  Causes her difficulty walking.  No numbness or weakness.  No bowel or bladder incontinence.  No recent trauma.  Took tramadol with minimal relief and caused her to be nauseous.  The history is provided by the patient.  Back Pain Location:  Gluteal region Quality:  Stabbing Radiates to: r groin. Pain severity:  Severe Pain is:  Same all the time Onset quality:  Gradual Duration:  3 days Timing:  Constant Progression:  Unchanged Chronicity:  New Context: not falling   Relieved by:  Nothing Worsened by:  Ambulation and bending Ineffective treatments: tramadol. Associated symptoms: no abdominal pain, no bladder incontinence, no bowel incontinence, no chest pain, no dysuria, no fever, no numbness and no weakness   Risk factors: hx of cancer        Past Medical History:  Diagnosis Date  . Allergy   . Anemia   . Anxiety   . Arthritis   . Asthma   . Benign positional vertigo   . Blood transfusion without reported diagnosis   . Cardiac pacemaker in situ 05/01/2015   for syncope & Sx Bradycardia -> Medtronic MRI compatible pacemaker placed Dr. Caryl Comes   . Cataract   . Chronic diarrhea   . Chronic kidney disease, stage IV (severe) ([place])   . Colon polyps   . Diastolic dysfunction, left ventricle 11/03/2013  . Diverticulosis   . Esophageal stricture   . GERD (gastroesophageal reflux disease)   . Gout   . Heart murmur   .  Hemorrhoids   . Hyperlipidemia   . Hypertensive heart disease    no significant RAS by MRA 2002- (<30% LRAS)   . Hypothyroidism   . IBS (irritable bowel syndrome)   . LBBB (left bundle branch block)- new 11/01/13 11/01/2013  . Paroxysmal atrial fibrillation 11/01/2013   Intermittent through the years and recurrent in December of 2014 treated with amiodarone;; Negative Myoview 10/2013  . Peripheral vascular disease    53-66% LICA, 4-40% RICA by doppler 2009   . Sinus arrest 04/28/2015  . Syncope and collapse 04/28/2015   s/p PPM  . Tubulovillous adenoma 4/07    Patient Active Problem List   Diagnosis Date Noted  . Orthostatic dizziness 04/21/2019  . Malnutrition of moderate degree 12/13/2017  . Pressure injury of skin 12/12/2017  . Hypokalemia 12/11/2017  . Acute kidney injury superimposed on CKD ([place]) 12/11/2017  . COPD (chronic obstructive pulmonary disease) ([place]) 12/11/2017  . Hypocalcemia 12/11/2017  . Hypoxia 12/05/2017  . Chronic back pain 07/16/2017  . Iron deficiency anemia 03/24/2017  . IDA (iron deficiency anemia) 03/20/2017  . Dyspnea on exertion 02/21/2017  . Insomnia, persistent 07/26/2015  . Cardiac pacemaker in situ   . Right bundle branch block (RBBB) intermittent  04/28/2015  . Closed left hip fracture ([place])   . Syncope, cardiogenic   . Abnormal liver function  tests 11/10/2014  . Systolic hypertension, isolated; difficult to control 10/04/2014  . CKD (chronic kidney disease), stage IV ([place]) 07/31/2014  . Gout 06/17/2014  . Edema of both legs 02/09/2014  . Hypertensive heart/kidney disease w/chronic kidney disease stage IV ([place]) 12/25/2013  . Hypertensive heart and chronic kidney disease with chronic diastolic congestive heart failure ([place]) 12/03/2013  . H/O tachycardia-bradycardia syndrome   . Chronic anticoagulation- Eliquis 11/17/2013  . LBBB (left bundle branch block)- new 11/01/13 11/01/2013  . Persistent Paroxysmal atrial fibrillation: CHA2DS2-VASc  Score 5, on Eliquis 11/01/2013  . Peripheral vascular disease   . Benign positional vertigo   . Irritable bowel syndrome 03/22/2009  . Anxiety state   . Reactive airway disease 01/13/2008  . Chronic anemia   . Osteoarthritis   . Hypothyroidism   . Hyperlipidemia   . Hypertensive heart disease     Past Surgical History:  Procedure Laterality Date  . APPENDECTOMY    . CATARACT EXTRACTION    . EP IMPLANTABLE DEVICE N/A 05/01/2015   Procedure: Pacemaker Implant;  Surgeon: Deboraha Sprang, MD;  Location: [facility];  Service: Cardiovascular;  Laterality: N/A;  . HIP ARTHROPLASTY Left 05/01/2015   Procedure: ARTHROPLASTY BIPOLAR HIP (HEMIARTHROPLASTY);  Surgeon: Rod Can, MD;  Location: [facility];  Service: Orthopedics;  Laterality: Left;  . LOW ANTERIOR BOWEL RESECTION  7/08  . [place] SURGERY  2000  . NM MYOVIEW LTD  03/18/2013   Negative for ischemia or infarction. EF 55%.  LOW RISK  . OOPHORECTOMY  1962   right  . squamous cell skin cancer removed    . TRANSTHORACIC ECHOCARDIOGRAM  10/2013, 02/2017   A) EF 55-60%, mild LVH, elevated bili pressures. Mild aortic valve calcification;; B) Normal LV size and function with mild LVH. Unable to assess diastolic function. Normal PA pressures. Mild MR,Mild AI     OB History   No obstetric history on file.     Family History  Problem Relation Age of Onset  . Cancer Mother 88       unknown  . Heart attack Father   . Heart disease Brother   . Stroke Maternal Grandmother   . Lung cancer Maternal Grandfather   . Stroke Paternal Grandmother   . Colon cancer Daughter   . Heart attack Son   . Esophageal cancer Neg Hx   . Rectal cancer Neg Hx   . Stomach cancer Neg Hx     Social History   Tobacco Use  . Smoking status: Never Smoker  . Smokeless tobacco: Never Used  . Tobacco comment: never used tobacco  Substance Use Topics  . Alcohol use: No    Alcohol/week: 0.0 standard drinks  . Drug use: No    Home  Medications Prior to Admission medications   Medication Sig Start Date End Date Taking? Authorizing Provider  acetaminophen (TYLENOL) 325 MG tablet Take 2 tablets (650 mg total) by mouth every 6 (six) hours as needed for mild pain (or Fever >/= 101). 12/17/17   Debbe Odea, MD  [employer] VITAMIN B12 1000 MCG tablet TAKE 1 TABLET BY MOUTH EVERY DAY 06/22/19   [name], MD  ELIQUIS 2.5 MG TABS tablet TAKE 1 TABLET BY MOUTH TWICE A DAY 12/14/19   [name], MD  febuxostat (ULORIC) 40 MG tablet TAKE 1 TABLET BY MOUTH EVERY OTHER DAY 12/14/19   [name], MD  furosemide (LASIX) 40 MG tablet TAKE 0.5 TABS DAILY AS NEEDED. TAKE 40MG  ONLY  IF MORE THAN 2-3 POUNDS OVERNIGHT AND/OR 5 POUNDS/WK 02/18/20   [name], MD  hydrALAZINE (APRESOLINE) 25 MG tablet TAKE 1 TAB 2 TIMES DAILY. TAKE THIS 25 MG DOSE ALONG WITH YOUR 50 MG DOSE (75 MG TOTAL) TWICE DAILY. 03/09/20   [name], MD  hydrALAZINE (APRESOLINE) 50 MG tablet TAKE 1 TABLET (50 MG TOTAL) BY MOUTH 2 (TWO) TIMES DAILY. TAKE THIS 50 MG DOSE ALONG WITH YOUR 25 MG DOSE (75 MG TOTAL) TWICE DAILY. 08/24/19 01/12/20  Erlene Quan, PA-C  hyoscyamine (LEVSIN SL) 0.125 MG SL tablet TAKE 1-2 TABLETS BY MOUTH EVERY 4 HOURS AS NEEDED 02/16/20   Ladene Artist, MD  levothyroxine (SYNTHROID) 88 MCG tablet TAKE 1 TABLET (88 MCG TOTAL) BY MOUTH DAILY BEFORE BREAKFAST. 08/24/19   [name], MD  loperamide (IMODIUM) 2 MG capsule Take 2 mg by mouth as needed for diarrhea or loose stools.    [provider]  LORazepam (ATIVAN) 0.5 MG tablet Take 1 tablet (0.5 mg total) by mouth every 8 (eight) hours as needed. 12/10/19   [name], MD  nitroGLYCERIN (NITROSTAT) 0.4 MG SL tablet Place 1 tablet (0.4 mg total) under the tongue every 5 (five) minutes x 3 doses as needed for chest pain. 01/01/19   [name], MD  omeprazole (PRILOSEC) 40 MG capsule Take 1 capsule (40 mg total) by mouth 2 (two) times daily. NEEDS APPT IN APRIL 03/06/20    Ladene Artist, MD  Polyethylene Glycol 3350 (MIRALAX PO) Take by mouth as needed.    [provider]  Potassium Chloride ER 20 MEQ TBCR Take 20 mEq by mouth daily as needed. Take only if you are taking your Lasix that day. 12/13/19   [name], MD  temazepam (RESTORIL) 15 MG capsule TAKE 2 CAPSULES AT BEDTIME AS NEEDED FOR SLEEP 10/27/19   [name], MD    Allergies    Penicillins, Xifaxan [rifaximin], Ambien [zolpidem tartrate], Amoxicillin, Ceftin [cefuroxime axetil], Codeine phosphate, Colchicine, Levofloxacin, and Lipitor [atorvastatin]  Review of Systems   Review of Systems  Constitutional: Negative for fever.  HENT: Negative for sore throat.   Eyes: Negative for visual disturbance.  Respiratory: Negative for shortness of breath.   Cardiovascular: Negative for chest pain.  Gastrointestinal: Negative for abdominal pain and bowel incontinence.  Genitourinary: Negative for bladder incontinence and dysuria.  Musculoskeletal: Positive for back pain.  Skin: Negative for rash.  Neurological: Negative for weakness and numbness.    Physical Exam Updated Vital Signs BP (!) 227/71   Pulse 62   Temp 98.4 F (36.9 C) (Oral)   Resp 16   Ht 5\' 2"  (1.575 m)   Wt 58.1 kg   SpO2 97%   BMI 23.41 kg/m   Physical Exam Vitals and nursing note reviewed.  Constitutional:      General: She is not in acute distress.    Appearance: She is well-developed.  HENT:     Head: Normocephalic and atraumatic.  Eyes:     Conjunctiva/sclera: Conjunctivae normal.  Cardiovascular:     Rate and Rhythm: Normal rate and regular rhythm.     Heart sounds: No murmur.  Pulmonary:     Effort: Pulmonary effort is normal. No respiratory distress.     Breath sounds: Normal breath sounds.  Abdominal:     Palpations: Abdomen is soft.     Tenderness: There is no abdominal tenderness.  Musculoskeletal:        General:  Tenderness present. Normal range of motion.     Cervical back: Neck  supple.     Comments: She has tenderness across her sacrum and right gluteal area.  Normal range of motion of hip internal and external rotation.  Other extremities full range of motion without any pain or limitations.  Skin:    General: Skin is warm and dry.     Capillary Refill: Capillary refill takes less than 2 seconds.  Neurological:     General: No focal deficit present.     Mental Status: She is alert.     Sensory: No sensory deficit.     Motor: No weakness.     ED Results / Procedures / Treatments   Labs (all labs ordered are listed, but only abnormal results are displayed) Labs Reviewed  BASIC METABOLIC PANEL - Abnormal; Notable for the following components:      Result Value   Glucose, Bld 112 (*)    BUN 28 (*)    Creatinine, Ser 1.43 (*)    GFR calc non Af Amer 31 (*)    GFR calc Af Amer 36 (*)    All other components within normal limits  CBC WITH DIFFERENTIAL/PLATELET - Abnormal; Notable for the following components:   RBC 3.45 (*)    Hemoglobin 10.8 (*)    HCT 34.7 (*)    MCV 100.6 (*)    Lymphs Abs 0.5 (*)    All other components within normal limits  URINALYSIS, ROUTINE W REFLEX MICROSCOPIC - Abnormal; Notable for the following components:   APPearance HAZY (*)    Hgb urine dipstick SMALL (*)    Protein, ur 100 (*)    Nitrite POSITIVE (*)    Leukocytes,Ua LARGE (*)    WBC, UA >50 (*)    Bacteria, UA MANY (*)    All other components within normal limits  URINE CULTURE    EKG EKG Interpretation  Date/Time:  Monday Mar 27 2020 09:11:45 EDT Ventricular Rate:  63 PR Interval:    QRS Duration: 157 QT Interval:  446 QTC Calculation: 457 R Axis:   -83 Text Interpretation: Atrial-sensed ventricular-paced rhythm No significant change since prior 1/19 Confirmed by Aletta Edouard [id number]) on 03/27/2020 9:15:05 AM   Radiology DG Lumbar Spine Complete  Result Date: 03/27/2020 CLINICAL DATA:  RIGHT lateral hip and groin pain since Saturday, no known injury  EXAM: LUMBAR SPINE - COMPLETE 4+ VIEW COMPARISON:  None FINDINGS: Osseous demineralization. Five non-rib bearing lumbar vertebrae. Multilevel disc space narrowing and endplate spur formation. Biconvex thoracolumbar scoliosis. Multilevel facet degenerative changes. Vertebral body heights maintained without fracture or subluxation. SI joints preserved. LEFT hip prosthesis identified. Atherosclerotic calcifications aorta. IMPRESSION: Thoracolumbar scoliosis with advanced degenerative disc and facet disease changes of the lumbar spine. No acute abnormalities. Electronically Signed   By: Lavonia Dana M.D.   On: 03/27/2020 10:33   DG Hip Unilat With Pelvis 2-3 Views Right  Result Date: 03/27/2020 CLINICAL DATA:  RIGHT lateral hip and groin pain since Saturday, no known injury EXAM: DG HIP (WITH OR WITHOUT PELVIS) 2-3V RIGHT COMPARISON:  None FINDINGS: Osseous demineralization. Multilevel degenerative disc and facet disease changes of visualized lumbar spine with scoliosis. LEFT hip prosthesis. RIGHT hip joint and SI joint spaces preserved. No acute fracture, dislocation, or bone destruction. Scattered atherosclerotic calcifications and pelvic phleboliths. IMPRESSION: Osseous demineralization without acute RIGHT hip abnormalities. Degenerative disc and facet disease changes at visualized lumbar spine. LEFT hip prosthesis. Electronically Signed   By: [name]  Thornton Papas M.D.   On: 03/27/2020 10:34    Procedures Procedures (including critical care time)  Medications Ordered in ED Medications  fentaNYL (SUBLIMAZE) injection 50 mcg (50 mcg Intravenous Given 03/27/20 1018)  HYDROcodone-acetaminophen (NORCO/VICODIN) 5-325 MG per tablet 1 tablet (1 tablet Oral Given 03/27/20 1127)  sulfamethoxazole-trimethoprim (BACTRIM DS) 800-160 MG per tablet 1 tablet (1 tablet Oral Given 03/27/20 1303)    ED Course  I have reviewed the triage vital signs and the nursing notes.  Pertinent labs & imaging results that were available  during my care of the patient were reviewed by me and considered in my medical decision making (see chart for details).  Clinical Course as of Mar 27 1737  Mon Mar 27, 2020  1023 CT lumbar spine interpreted by me as diffuse arthritic changes.  Pelvis and right hip interpreted by me as no gross fractures.  Awaiting radiology reading.   [MB]  1152 Patient urinalysis consistent with infection.  Greater than 50 whites and nitrite positive.  Her last urine culture was positive for Klebsiella few years ago.  She has allergies to cephalosporins and penicillins.   [MB]  1247 Pain improved with the hydrocodone.  Will ambulate her here and and see if appropriate for discharge.   [MB]  1252 Patient ambulated in the department with a walker.  She said she felt a little lightheaded getting up which is likely the medication.  Will review with the daughter to see if they are comfortable going home.   [MB]    Clinical Course User Index [MB] Hayden Rasmussen, MD   MDM Rules/Calculators/A&P                     This patient complains of worsening of her chronic back pain; this involves an extensive number of treatment Options and is a complaint that carries with it a high risk of complications and Morbidity. The differential includes compression fracture, arthritis, musculoskeletal, metabolic derangement, anemia, infection  I ordered, reviewed and interpreted labs, which included ABC with normal white count, stable hemoglobin, chemistries with stable creatinine.  UA concerning for infection nitrite positive greater than 50 whites. I ordered medication fentanyl and Vicodin with improvement in her pain I ordered imaging studies which included lumbosacral spine along with pelvis and right hip and I independently    visualized and interpreted imaging which showed significant arthritic changes no acute fracture Additional history obtained from patient's daughter Previous records obtained and reviewed in  epic  After the interventions stated above, I reevaluated the patient and found patient's pain to be controlled and is ambulating in the department.  Daughter would like to take her home to her house controlled she regains more strength.  We will put a consult in for home health.  Return instructions discussed   Final Clinical Impression(s) / ED Diagnoses Final diagnoses:  Acute low back pain, unspecified back pain laterality, unspecified whether sciatica present  Lower urinary tract infectious disease    Rx / DC Orders ED Discharge Orders         Ordered    HYDROcodone-acetaminophen (NORCO/VICODIN) 5-325 MG tablet  Every 6 hours PRN     03/27/20 1300    sulfamethoxazole-trimethoprim (BACTRIM DS) 800-160 MG tablet  2 times daily     03/27/20 1300           Hayden Rasmussen, MD 03/27/20 [phone]

## 2020-03-27 NOTE — ED Notes (Signed)
Pt ambulate in the hall using RW with no c/o. Nurse notified.

## 2020-03-27 NOTE — ED Notes (Signed)
Pt transported to xray 

## 2020-03-27 NOTE — Telephone Encounter (Signed)
May benefit from home health services and physical therapy

## 2020-03-27 NOTE — ED Triage Notes (Addendum)
Pt to ED from home via EMS c/o worsening lower back pain over the past 3 days, No known injury/fall. Took tramadol this morning with little relief. Uses walker to assist with ambulation. HX chronic back pain , arthritis, pacemaker, a fib, HTN- has not taking bp medications today.  .   EMS Last vs:  190/82, 95 pulse, 16 RR, 96%RA, CBG 128, pain 8/10

## 2020-03-27 NOTE — ED Notes (Signed)
Pt d/c home per MD order. Discharge summary reviewed , pt verbalizes understanding. Off unit via WC- no s/s of acute distress noted. Discharged home with son.

## 2020-03-29 LAB — URINE CULTURE: Culture: >=100000 — AB

## 2020-03-30 ENCOUNTER — Telehealth: Payer: Self-pay | Admitting: *Deleted

## 2020-03-30 NOTE — Telephone Encounter (Signed)
Post ED Visit - Positive Culture Follow-up  Culture report reviewed by antimicrobial stewardship pharmacist: [place] Team []  Elenor Quinones, Pharm.D. []  Heide Guile, Pharm.D., BCPS AQ-ID []  Parks Neptune, Pharm.D., BCPS []  Alycia Rossetti, Pharm.D., BCPS []  [place], Pharm.D., BCPS, AAHIVP []  Legrand Como, Pharm.D., BCPS, AAHIVP []  Salome Arnt, PharmD, BCPS []  Johnnette Gourd, PharmD, BCPS []  Hughes Better, PharmD, BCPS []  Leeroy Cha, PharmD []  Laqueta Linden, PharmD, BCPS []  Albertina Parr, PharmD Acey Lav, PharmD  [place] Team []  Leodis Sias, PharmD []  Lindell Spar, PharmD []  Royetta Asal, PharmD []  Graylin Shiver, Rph []  Rema Fendt) Glennon Mac, PharmD []  Arlyn Dunning, PharmD []  Netta Cedars, PharmD []  Dia Sitter, PharmD []  Leone Haven, PharmD []  Gretta Arab, PharmD []  Theodis Shove, PharmD []  Peggyann Juba, PharmD []  Reuel Boom, PharmD   Positive urine culture Treated with Sulfamethoxazole-Trimethoprim, organism sensitive to the same and no further patient follow-up is required at this time.  Harlon Flor [facility] 03/30/2020, 12:25 PM

## 2020-03-31 ENCOUNTER — Telehealth: Payer: Self-pay | Admitting: Family Medicine

## 2020-03-31 NOTE — Telephone Encounter (Signed)
Spoke to patients daughter, she is aware of the message below.

## 2020-03-31 NOTE — Telephone Encounter (Signed)
Pt call and want a call back  at [phone] about the labs that she had done on 03/24/20 she stated she need to know was anything found because she is still in a great pain.

## 2020-03-31 NOTE — Telephone Encounter (Signed)
Please advise. I do not see any results on these labs.

## 2020-03-31 NOTE — Telephone Encounter (Signed)
The labs we did here on 03-21-20 were normal except for stable renal insufficiency. Then she went tot the ER on 03-27-20. The labs there showed the same thing and it showed an E coli UTI. They gave her Bactrim for this as well as pain medication

## 2020-04-03 ENCOUNTER — Telehealth: Payer: Self-pay | Admitting: Family Medicine

## 2020-04-03 NOTE — Telephone Encounter (Signed)
Pt daughter Butch Penny )would  Like the Dr to know that the patient  Is dehydrated and will not eat  [phone]

## 2020-04-04 NOTE — Telephone Encounter (Signed)
Noted. Will send to Dr. Sarajane Jews as Juluis Rainier

## 2020-04-04 NOTE — Telephone Encounter (Signed)
Butch Penny called back today and I advised her that Dr. Sarajane Jews is out of the office but we can schedule a virtual visit with Dr. Maudie Mercury but Butch Penny just wanted to talk with the nurse to see if it is possible to tell for dehydration. I transfer Butch Penny to the triage nurse.

## 2020-04-11 DIAGNOSIS — M47816 Spondylosis without myelopathy or radiculopathy, lumbar region: Secondary | ICD-10-CM | POA: Diagnosis not present

## 2020-04-11 DIAGNOSIS — M47896 Other spondylosis, lumbar region: Secondary | ICD-10-CM | POA: Diagnosis not present

## 2020-04-11 NOTE — Telephone Encounter (Signed)
Noted. Hopefully they can encourage her to drink as much fluids as she can

## 2020-04-11 NOTE — Telephone Encounter (Signed)
They are aware to encourage fluid intake.

## 2020-04-17 ENCOUNTER — Encounter: Payer: Self-pay | Admitting: Family Medicine

## 2020-04-17 MED ORDER — OXYCODONE-ACETAMINOPHEN 5-325 MG PO TABS: 1.0000 | ORAL_TABLET | Freq: Four times a day (QID) | ORAL | 0 refills | Status: AC | PRN

## 2020-04-17 NOTE — Telephone Encounter (Signed)
I sent her in some Percocet (oxycodone) to try since this is stronger than hydrocodone. Hopefully I can see her this week

## 2020-04-26 ENCOUNTER — Other Ambulatory Visit: Payer: Self-pay | Admitting: Chiropractic Medicine

## 2020-04-26 DIAGNOSIS — M545 Low back pain, unspecified: Secondary | ICD-10-CM

## 2020-04-27 NOTE — Progress Notes (Deleted)
Cardiology Office Note   Date:  04/27/2020   ID:  [name], DOB [birth year], MRN [id number]  PCP:  [name], MD  Cardiologist: Dr. Ellyn Hack  No chief complaint on file.    History of Present Illness: [name] is a 86 y.o. female who presents for ongoing assessment and management of persistent longstanding atrial fibrillation, history of tachybradycardia syndrome status post pacemaker,HFpEF, mild LVH.  When last seen on 01/12/2020 the patient was keeping track of her blood pressure.  She complained of a lot of back pain and also in her shoulders.  She also suffers from a lot of anxiety.  She was advised if her blood pressure remained elevated above 160/70 she could take an additional 25 mg of hydralazine x1, and to continue her current regimen of 75 mg of hydralazine twice daily, amlodipine 2.5 mg daily her blood pressure should be elevated for several hours before taking an additional dose of hydralazine.  If she continuously needed to take extra doses she would call us so that we can adjust her medication regimen.  She was to follow-up with PCP with possible referral to pain management.  She was seen in the ED in May 2021 for UTI and was treated with p.o. antibiotics and released.  She is also followed by PCP, Dr. Sarajane Jews) for pain management as well.    Past Medical History:  Diagnosis Date  . Allergy   . Anemia   . Anxiety   . Arthritis   . Asthma   . Benign positional vertigo   . Blood transfusion without reported diagnosis   . Cardiac pacemaker in situ 05/01/2015   for syncope & Sx Bradycardia -> Medtronic MRI compatible pacemaker placed Dr. Caryl Comes   . Cataract   . Chronic diarrhea   . Chronic kidney disease, stage IV (severe) ([place])   . Colon polyps   . Diastolic dysfunction, left ventricle 11/03/2013  . Diverticulosis   . Esophageal stricture   . GERD (gastroesophageal reflux disease)   . Gout   . Heart murmur   . Hemorrhoids   . Hyperlipidemia   .  Hypertensive heart disease    no significant RAS by MRA 2002- (<30% LRAS)   . Hypothyroidism   . IBS (irritable bowel syndrome)   . LBBB (left bundle branch block)- new 11/01/13 11/01/2013  . Paroxysmal atrial fibrillation 11/01/2013   Intermittent through the years and recurrent in December of 2014 treated with amiodarone;; Negative Myoview 10/2013  . Peripheral vascular disease    80-32% LICA, 1-22% RICA by doppler 2009   . Sinus arrest 04/28/2015  . Syncope and collapse 04/28/2015   s/p PPM  . Tubulovillous adenoma 4/07    Past Surgical History:  Procedure Laterality Date  . APPENDECTOMY    . CATARACT EXTRACTION    . EP IMPLANTABLE DEVICE N/A 05/01/2015   Procedure: Pacemaker Implant;  Surgeon: Deboraha Sprang, MD;  Location: [facility];  Service: Cardiovascular;  Laterality: N/A;  . HIP ARTHROPLASTY Left 05/01/2015   Procedure: ARTHROPLASTY BIPOLAR HIP (HEMIARTHROPLASTY);  Surgeon: Rod Can, MD;  Location: [facility];  Service: Orthopedics;  Laterality: Left;  . LOW ANTERIOR BOWEL RESECTION  7/08  . [place] SURGERY  2000  . NM MYOVIEW LTD  03/18/2013   Negative for ischemia or infarction. EF 55%.  LOW RISK  . OOPHORECTOMY  1962   right  . squamous cell skin cancer removed    . TRANSTHORACIC ECHOCARDIOGRAM  10/2013, 02/2017  A) EF 55-60%, mild LVH, elevated bili pressures. Mild aortic valve calcification;; B) Normal LV size and function with mild LVH. Unable to assess diastolic function. Normal PA pressures. Mild MR,Mild AI     Current Outpatient Medications  Medication Sig Dispense Refill  . acetaminophen (TYLENOL) 325 MG tablet Take 2 tablets (650 mg total) by mouth every 6 (six) hours as needed for mild pain (or Fever >/= 101).    . CVS VITAMIN B12 1000 MCG tablet TAKE 1 TABLET BY MOUTH EVERY DAY (Patient taking differently: Take 1,000 mcg by mouth daily. ) 90 tablet 3  . ELIQUIS 2.5 MG TABS tablet TAKE 1 TABLET BY MOUTH TWICE A DAY 180 tablet 1  . febuxostat  (ULORIC) 40 MG tablet TAKE 1 TABLET BY MOUTH EVERY OTHER DAY (Patient taking differently: Take 40 mg by mouth 3 (three) times a week. ) 45 tablet 3  . furosemide (LASIX) 40 MG tablet TAKE 0.5 TABS DAILY AS NEEDED. TAKE 40MG  ONLY IF MORE THAN 2-3 POUNDS OVERNIGHT AND/OR 5 POUNDS/WK (Patient taking differently: Take 20-40 mg by mouth as needed for fluid. ) 30 tablet 6  . hydrALAZINE (APRESOLINE) 25 MG tablet TAKE 1 TAB 2 TIMES DAILY. TAKE THIS 25 MG DOSE ALONG WITH YOUR 50 MG DOSE (75 MG TOTAL) TWICE DAILY. (Patient taking differently: Take 25 mg by mouth 2 (two) times daily. ) 180 tablet 0  . hydrALAZINE (APRESOLINE) 50 MG tablet TAKE 1 TABLET (50 MG TOTAL) BY MOUTH 2 (TWO) TIMES DAILY. TAKE THIS 50 MG DOSE ALONG WITH YOUR 25 MG DOSE (75 MG TOTAL) TWICE DAILY. 180 tablet 3  . HYDROcodone-acetaminophen (NORCO/VICODIN) 5-325 MG tablet Take 1 tablet by mouth every 6 (six) hours as needed for severe pain. 10 tablet 0  . hyoscyamine (LEVSIN SL) 0.125 MG SL tablet TAKE 1-2 TABLETS BY MOUTH EVERY 4 HOURS AS NEEDED (Patient taking differently: Take 0.125-0.25 mg by mouth every 4 (four) hours as needed for cramping. TAKE 1-2 TABLETS BY MOUTH EVERY 4 HOURS AS NEEDED) 120 tablet 11  . levothyroxine (SYNTHROID) 88 MCG tablet TAKE 1 TABLET (88 MCG TOTAL) BY MOUTH DAILY BEFORE BREAKFAST. (Patient taking differently: Take 88 mcg by mouth daily before breakfast. TAKE 1 TABLET (88 MCG TOTAL) BY MOUTH DAILY BEFORE BREAKFAST.) 90 tablet 3  . loperamide (IMODIUM) 2 MG capsule Take 2 mg by mouth as needed for diarrhea or loose stools.    Marland Kitchen LORazepam (ATIVAN) 0.5 MG tablet Take 1 tablet (0.5 mg total) by mouth every 8 (eight) hours as needed. (Patient taking differently: Take 0.5 mg by mouth every 8 (eight) hours as needed for anxiety. ) 90 tablet 5  . MYRBETRIQ 25 MG TB24 tablet Take 25 mg by mouth every other day.     . nitroGLYCERIN (NITROSTAT) 0.4 MG SL tablet Place 1 tablet (0.4 mg total) under the tongue every 5 (five)  minutes x 3 doses as needed for chest pain. 25 tablet 3  . omeprazole (PRILOSEC) 40 MG capsule Take 1 capsule (40 mg total) by mouth 2 (two) times daily. NEEDS APPT IN APRIL 60 capsule 11  . Polyethylene Glycol 3350 (MIRALAX PO) Take 17 g by mouth as needed (constipation).     . Potassium Chloride ER 20 MEQ TBCR Take 20 mEq by mouth daily as needed. Take only if you are taking your Lasix that day. 14 tablet 3  . temazepam (RESTORIL) 15 MG capsule TAKE 2 CAPSULES AT BEDTIME AS NEEDED FOR SLEEP (Patient taking differently: Take 30 mg  by mouth at bedtime as needed for sleep. ) 60 capsule 5   No current facility-administered medications for this visit.    Allergies:   Penicillins, Xifaxan [rifaximin], Ambien [zolpidem tartrate], Amoxicillin, Ceftin [cefuroxime axetil], Codeine phosphate, Colchicine, Levofloxacin, and Lipitor [atorvastatin]    Social History:  The patient  reports that she has never smoked. She has never used smokeless tobacco. She reports that she does not drink alcohol and does not use drugs.   Family History:  The patient's family history includes Cancer (age of onset: 21) in her mother; Colon cancer in her daughter; Heart attack in her father and son; Heart disease in her brother; Lung cancer in her maternal grandfather; Stroke in her maternal grandmother and paternal grandmother.    ROS: All other systems are reviewed and negative. Unless otherwise mentioned in H&P    PHYSICAL EXAM: VS:  There were no vitals taken for this visit. , BMI There is no height or weight on file to calculate BMI. GEN: Well nourished, well developed, in no acute distress HEENT: normal Neck: no JVD, carotid bruits, or masses Cardiac: ***RRR; no murmurs, rubs, or gallops,no edema  Respiratory:  Clear to auscultation bilaterally, normal work of breathing GI: soft, nontender, nondistended, + BS MS: no deformity or atrophy Skin: warm and dry, no rash Neuro:  Strength and sensation are intact Psych:  euthymic mood, full affect   EKG:  EKG {ACTION; IS/IS [id number]} ordered today. The ekg ordered today demonstrates ***   Recent Labs: 08/24/2019: Magnesium 1.4 03/21/2020: Pro B Natriuretic peptide (BNP) 402.0; TSH 1.10 03/27/2020: BUN 28; Creatinine, Ser 1.43; Hemoglobin 10.8; Platelets 152; Potassium 4.6; Sodium 138    Lipid Panel    Component Value Date/Time   CHOL 159 11/02/2013 0440   TRIG 111 11/02/2013 0440   HDL 47 11/02/2013 0440   CHOLHDL 3.4 11/02/2013 0440   VLDL 22 11/02/2013 0440   LDLCALC 90 11/02/2013 0440   LDLDIRECT 142.3 09/08/2013 1147      Wt Readings from Last 3 Encounters:  03/27/20 128 lb (58.1 kg)  03/21/20 128 lb (58.1 kg)  2020/03/26 123 lb 6.4 oz (56 kg)      Other studies Reviewed: Echocardiogram Mar 26, 2017 Left ventricle: The cavity size was normal. Wall thickness was  increased in a pattern of mild LVH. Systolic function was normal.  Wall motion was normal; there were no regional wall motion  abnormalities. The study is not technically sufficient to allow  evaluation of LV diastolic function.  - Aortic valve: Transvalvular velocity was within the normal range.  There was no stenosis. There was mild regurgitation.  - Mitral valve: Transvalvular velocity was within the normal range.  There was no evidence for stenosis. There was mild regurgitation.  - Left atrium: The atrium was severely dilated.  - Right ventricle: The cavity size was normal. Wall thickness was  normal. Systolic function was normal.  - Tricuspid valve: There was mild regurgitation.  - Pulmonary arteries: Systolic pressure was within the normal  range. PA peak pressure: 30 mm Hg (S).   Remote Transmission of PPM 03/20/2020 Device remote reviewed. Remote is normal. Battery status is good. Lead measurements unchanged. Histograms are appropriate.   ASSESSMENT AND PLAN:  1.  ***   Current medicines are reviewed at length with the patient today.  I  have spent *** dedicated to the care of this patient on the date of this encounter to include pre-visit review of records, assessment, management and diagnostic testing,with shared decision making.  Labs/ tests ordered today include: *** Phill Myron. West Pugh, ANP, [facility]   04/27/2020 11:23 AM    [facility] [address] Office [phone] Fax [phone]  Notice: This dictation was prepared with Dragon dictation along with smaller phrase technology. Any transcriptional errors that result from this process are unintentional and may not be corrected upon review.

## 2020-05-01 ENCOUNTER — Ambulatory Visit: Payer: Medicare PPO | Admitting: Adult Health

## 2020-05-04 ENCOUNTER — Emergency Department (HOSPITAL_BASED_OUTPATIENT_CLINIC_OR_DEPARTMENT_OTHER): Payer: Medicare PPO

## 2020-05-04 ENCOUNTER — Encounter (HOSPITAL_BASED_OUTPATIENT_CLINIC_OR_DEPARTMENT_OTHER): Payer: Self-pay | Admitting: Emergency Medicine

## 2020-05-04 ENCOUNTER — Other Ambulatory Visit: Payer: Self-pay

## 2020-05-04 ENCOUNTER — Emergency Department (HOSPITAL_BASED_OUTPATIENT_CLINIC_OR_DEPARTMENT_OTHER)
Admission: EM | Admit: 2020-05-04 | Discharge: 2020-05-04 | Disposition: A | Discharge status: Home / Self Care | Payer: Medicare PPO | Attending: Emergency Medicine | Admitting: Emergency Medicine

## 2020-05-04 DIAGNOSIS — W19XXXA Unspecified fall, initial encounter: Secondary | ICD-10-CM | POA: Diagnosis not present

## 2020-05-04 DIAGNOSIS — Z880 Allergy status to penicillin: Secondary | ICD-10-CM | POA: Insufficient documentation

## 2020-05-04 DIAGNOSIS — I129 Hypertensive chronic kidney disease with stage 1 through stage 4 chronic kidney disease, or unspecified chronic kidney disease: Secondary | ICD-10-CM | POA: Diagnosis not present

## 2020-05-04 DIAGNOSIS — Z96642 Presence of left artificial hip joint: Secondary | ICD-10-CM | POA: Insufficient documentation

## 2020-05-04 DIAGNOSIS — M4186 Other forms of scoliosis, lumbar region: Secondary | ICD-10-CM | POA: Diagnosis not present

## 2020-05-04 DIAGNOSIS — S199XXA Unspecified injury of neck, initial encounter: Secondary | ICD-10-CM | POA: Diagnosis not present

## 2020-05-04 DIAGNOSIS — Y9389 Activity, other specified: Secondary | ICD-10-CM | POA: Diagnosis not present

## 2020-05-04 DIAGNOSIS — Y999 Unspecified external cause status: Secondary | ICD-10-CM | POA: Diagnosis not present

## 2020-05-04 DIAGNOSIS — M25551 Pain in right hip: Secondary | ICD-10-CM | POA: Diagnosis not present

## 2020-05-04 DIAGNOSIS — N184 Chronic kidney disease, stage 4 (severe): Secondary | ICD-10-CM | POA: Insufficient documentation

## 2020-05-04 DIAGNOSIS — Y92009 Unspecified place in unspecified non-institutional (private) residence as the place of occurrence of the external cause: Secondary | ICD-10-CM | POA: Insufficient documentation

## 2020-05-04 DIAGNOSIS — M545 Low back pain, unspecified: Secondary | ICD-10-CM

## 2020-05-04 DIAGNOSIS — J45909 Unspecified asthma, uncomplicated: Secondary | ICD-10-CM | POA: Insufficient documentation

## 2020-05-04 DIAGNOSIS — S300XXA Contusion of lower back and pelvis, initial encounter: Secondary | ICD-10-CM | POA: Insufficient documentation

## 2020-05-04 DIAGNOSIS — G319 Degenerative disease of nervous system, unspecified: Secondary | ICD-10-CM | POA: Diagnosis not present

## 2020-05-04 DIAGNOSIS — M419 Scoliosis, unspecified: Secondary | ICD-10-CM | POA: Diagnosis not present

## 2020-05-04 DIAGNOSIS — G8929 Other chronic pain: Secondary | ICD-10-CM | POA: Diagnosis not present

## 2020-05-04 DIAGNOSIS — S79911A Unspecified injury of right hip, initial encounter: Secondary | ICD-10-CM | POA: Diagnosis not present

## 2020-05-04 DIAGNOSIS — E039 Hypothyroidism, unspecified: Secondary | ICD-10-CM | POA: Insufficient documentation

## 2020-05-04 DIAGNOSIS — S3992XA Unspecified injury of lower back, initial encounter: Secondary | ICD-10-CM | POA: Diagnosis not present

## 2020-05-04 DIAGNOSIS — M47816 Spondylosis without myelopathy or radiculopathy, lumbar region: Secondary | ICD-10-CM | POA: Diagnosis not present

## 2020-05-04 DIAGNOSIS — I6782 Cerebral ischemia: Secondary | ICD-10-CM | POA: Diagnosis not present

## 2020-05-04 DIAGNOSIS — S0990XA Unspecified injury of head, initial encounter: Secondary | ICD-10-CM | POA: Diagnosis not present

## 2020-05-04 NOTE — ED Triage Notes (Signed)
Pt fell today and c/o R hip pain. She states she has been taking pain meds for back pain which makes her fall.

## 2020-05-04 NOTE — ED Notes (Signed)
Per pt she does not think she hit her head during the fall, but is not sure. Pt remains A/Ox4 in waiting area.

## 2020-05-04 NOTE — ED Notes (Signed)
Pt with chronic pain has been on Norco QID since May with little relief but states pain medication is reason for frequent falls. Pt on anticoags, no notable bruising or bleeding. 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

## 2020-05-04 NOTE — ED Provider Notes (Signed)
[facility] Provider Note: Georgena Spurling, MD, FACEP  CSN: [id number] MRN: [id number] ARRIVAL: 05/04/20 at [place]: [place]  Fall   HISTORY OF PRESENT ILLNESS  05/04/20 11:11 PM [name] is a 86 y.o. female with chronic right hip (actually located in the buttock) pain which has been worse for the past 3 weeks.  She is on hydrocodone for this.  She has a CT of the lumbar spine pending.  Yesterday evening she fell backwards at home landing on her buttocks.  She complains of an exacerbation of her chronic pain.  She is also having new pain in her coccyx.  She rates the pain in her right buttock as a 10 out of 10, worse with movement but not with palpation.  She has no new numbness or weakness.  The pain in her coccyx is mild.   Past Medical History:  Diagnosis Date  . Allergy   . Anemia   . Anxiety   . Arthritis   . Asthma   . Benign positional vertigo   . Blood transfusion without reported diagnosis   . Cardiac pacemaker in situ 05/01/2015   for syncope & Sx Bradycardia -> Medtronic MRI compatible pacemaker placed Dr. Caryl Comes   . Cataract   . Chronic diarrhea   . Chronic kidney disease, stage IV (severe) ([place])   . Colon polyps   . Diastolic dysfunction, left ventricle 11/03/2013  . Diverticulosis   . Esophageal stricture   . GERD (gastroesophageal reflux disease)   . Gout   . Heart murmur   . Hemorrhoids   . Hyperlipidemia   . Hypertensive heart disease    no significant RAS by MRA 2002- (<30% LRAS)   . Hypothyroidism   . IBS (irritable bowel syndrome)   . LBBB (left bundle branch block)- new 11/01/13 11/01/2013  . Paroxysmal atrial fibrillation 11/01/2013   Intermittent through the years and recurrent in December of 2014 treated with amiodarone;; Negative Myoview 10/2013  . Peripheral vascular disease    64-33% LICA, 2-95% RICA by doppler 2009   . Sinus arrest 04/28/2015  . Syncope and collapse 04/28/2015   s/p PPM  .  Tubulovillous adenoma 4/07    Past Surgical History:  Procedure Laterality Date  . APPENDECTOMY    . CATARACT EXTRACTION    . EP IMPLANTABLE DEVICE N/A 05/01/2015   Procedure: Pacemaker Implant;  Surgeon: Deboraha Sprang, MD;  Location: [facility];  Service: Cardiovascular;  Laterality: N/A;  . HIP ARTHROPLASTY Left 05/01/2015   Procedure: ARTHROPLASTY BIPOLAR HIP (HEMIARTHROPLASTY);  Surgeon: Rod Can, MD;  Location: [facility];  Service: Orthopedics;  Laterality: Left;  . LOW ANTERIOR BOWEL RESECTION  7/08  . [place] SURGERY  2000  . NM MYOVIEW LTD  03/18/2013   Negative for ischemia or infarction. EF 55%.  LOW RISK  . OOPHORECTOMY  1962   right  . squamous cell skin cancer removed    . TRANSTHORACIC ECHOCARDIOGRAM  10/2013, 02/2017   A) EF 55-60%, mild LVH, elevated bili pressures. Mild aortic valve calcification;; B) Normal LV size and function with mild LVH. Unable to assess diastolic function. Normal PA pressures. Mild MR,Mild AI    Family History  Problem Relation Age of Onset  . Cancer Mother 46       unknown  . Heart attack Father   . Heart disease Brother   . Stroke Maternal Grandmother   . Lung cancer Maternal Grandfather   .  Stroke Paternal Grandmother   . Colon cancer Daughter   . Heart attack Son   . Esophageal cancer Neg Hx   . Rectal cancer Neg Hx   . Stomach cancer Neg Hx     Social History   Tobacco Use  . Smoking status: Never Smoker  . Smokeless tobacco: Never Used  . Tobacco comment: never used tobacco  Vaping Use  . Vaping Use: Never used  Substance Use Topics  . Alcohol use: No    Alcohol/week: 0.0 standard drinks  . Drug use: No    Prior to Admission medications   Medication Sig Start Date End Date Taking? Authorizing Provider  acetaminophen (TYLENOL) 325 MG tablet Take 2 tablets (650 mg total) by mouth every 6 (six) hours as needed for mild pain (or Fever >/= 101). 12/17/17   Debbe Odea, MD  CVS VITAMIN B12 1000 MCG tablet TAKE  1 TABLET BY MOUTH EVERY DAY Patient taking differently: Take 1,000 mcg by mouth daily.  06/22/19   [name], MD  ELIQUIS 2.5 MG TABS tablet TAKE 1 TABLET BY MOUTH TWICE A DAY 12/14/19   [name], MD  febuxostat (ULORIC) 40 MG tablet TAKE 1 TABLET BY MOUTH EVERY OTHER DAY Patient taking differently: Take 40 mg by mouth 3 (three) times a week.  12/14/19   [name], MD  furosemide (LASIX) 40 MG tablet TAKE 0.5 TABS DAILY AS NEEDED. TAKE 40MG  ONLY IF MORE THAN 2-3 POUNDS OVERNIGHT AND/OR 5 POUNDS/WK Patient taking differently: Take 20-40 mg by mouth as needed for fluid.  02/18/20   [name], MD  hydrALAZINE (APRESOLINE) 25 MG tablet TAKE 1 TAB 2 TIMES DAILY. TAKE THIS 25 MG DOSE ALONG WITH YOUR 50 MG DOSE (75 MG TOTAL) TWICE DAILY. Patient taking differently: Take 25 mg by mouth 2 (two) times daily.  03/09/20   [name], MD  hydrALAZINE (APRESOLINE) 50 MG tablet TAKE 1 TABLET (50 MG TOTAL) BY MOUTH 2 (TWO) TIMES DAILY. TAKE THIS 50 MG DOSE ALONG WITH YOUR 25 MG DOSE (75 MG TOTAL) TWICE DAILY. 08/24/19 03/27/20  Erlene Quan, PA-C  HYDROcodone-acetaminophen (NORCO/VICODIN) 5-325 MG tablet Take 1 tablet by mouth every 6 (six) hours as needed for severe pain. 03/27/20   Hayden Rasmussen, MD  hyoscyamine (LEVSIN SL) 0.125 MG SL tablet TAKE 1-2 TABLETS BY MOUTH EVERY 4 HOURS AS NEEDED Patient taking differently: Take 0.125-0.25 mg by mouth every 4 (four) hours as needed for cramping. TAKE 1-2 TABLETS BY MOUTH EVERY 4 HOURS AS NEEDED 02/16/20   Ladene Artist, MD  levothyroxine (SYNTHROID) 88 MCG tablet TAKE 1 TABLET (88 MCG TOTAL) BY MOUTH DAILY BEFORE BREAKFAST. Patient taking differently: Take 88 mcg by mouth daily before breakfast. TAKE 1 TABLET (88 MCG TOTAL) BY MOUTH DAILY BEFORE BREAKFAST. 08/24/19   [name], MD  loperamide (IMODIUM) 2 MG capsule Take 2 mg by mouth as needed for diarrhea or loose stools.    [provider]  LORazepam (ATIVAN) 0.5 MG tablet  Take 1 tablet (0.5 mg total) by mouth every 8 (eight) hours as needed. Patient taking differently: Take 0.5 mg by mouth every 8 (eight) hours as needed for anxiety.  12/10/19   [name], MD  MYRBETRIQ 25 MG TB24 tablet Take 25 mg by mouth every other day.  02/24/20   [provider]  nitroGLYCERIN (NITROSTAT) 0.4 MG SL tablet Place 1 tablet (0.4 mg total) under the tongue every 5 (five)  minutes x 3 doses as needed for chest pain. 01/01/19   [name], MD  omeprazole (PRILOSEC) 40 MG capsule Take 1 capsule (40 mg total) by mouth 2 (two) times daily. NEEDS APPT IN APRIL 03/06/20   Ladene Artist, MD  Polyethylene Glycol 3350 (MIRALAX PO) Take 17 g by mouth as needed (constipation).     [provider]  Potassium Chloride ER 20 MEQ TBCR Take 20 mEq by mouth daily as needed. Take only if you are taking your Lasix that day. 12/13/19   [name], MD  temazepam (RESTORIL) 15 MG capsule TAKE 2 CAPSULES AT BEDTIME AS NEEDED FOR SLEEP Patient taking differently: Take 30 mg by mouth at bedtime as needed for sleep.  10/27/19   [name], MD    Allergies Penicillins, Xifaxan [rifaximin], Ambien [zolpidem tartrate], Amoxicillin, Ceftin [cefuroxime axetil], Codeine phosphate, Colchicine, Levofloxacin, and Lipitor [atorvastatin]   REVIEW OF SYSTEMS  Negative except as noted here or in the History of Present Illness.   PHYSICAL EXAMINATION  Initial Vital Signs Blood pressure (!) 173/72, pulse 62, temperature 98.2 F (36.8 C), temperature source Oral, resp. rate 20, height 5\' 2"  (1.575 m), weight 52.6 kg, SpO2 97 %.  Examination General: Well-developed, thin female in no acute distress; appearance consistent with age of record HENT: normocephalic; atraumatic Eyes: Right pupil irregular due to surgical changes; left pupil round and reactive to light; extraocular muscles intact Neck: supple; nontender Heart: regular rate and rhythm Lungs: clear to auscultation  bilaterally Abdomen: soft; nondistended; nontender; bowel sounds present Extremities: Arthritic changes; no pain on passive range of motion of hips; mild coccyx tenderness; no right sciatic notch tenderness Neurologic: Awake, alert and oriented; motor function intact in all extremities and symmetric; no facial droop Skin: Warm and dry Psychiatric: Normal mood and affect   RESULTS  Summary of this visit's results, reviewed and interpreted by myself:   EKG Interpretation  Date/Time:    Ventricular Rate:    PR Interval:    QRS Duration:   QT Interval:    QTC Calculation:   R Axis:     Text Interpretation:        Laboratory Studies: No results found for this or any previous visit (from the past 24 hour(s)). Imaging Studies: DG Lumbar Spine Complete  Result Date: 05/04/2020 CLINICAL DATA:  Right hip pain since a fall today. Chronic back pain. EXAM: LUMBAR SPINE - COMPLETE 4+ VIEW COMPARISON:  Radiographs dated 03/27/2020 FINDINGS: No evidence of acute lumbar fracture. Chronic thoracolumbar scoliosis. Diffuse degenerative disc and joint disease throughout the lumbar spine, unchanged. IMPRESSION: No acute abnormality. Stable chronic degenerative disc and joint disease. Electronically Signed   By: [name] M.D.   On: 05/04/2020 21:40   CT Head Wo Contrast  Result Date: 05/04/2020 CLINICAL DATA:  Head trauma EXAM: CT HEAD WITHOUT CONTRAST TECHNIQUE: Contiguous axial images were obtained from the base of the skull through the vertex without intravenous contrast. COMPARISON:  CT brain and cervical spine 04/28/2015 FINDINGS: Brain: No acute territorial infarction, hemorrhage, or intracranial mass. Advanced atrophy. Mild chronic small vessel ischemic change of the white matter. Stable ventricle size. Vascular: No hyperdense vessels. Vertebral and carotid vascular calcification Skull: Normal. Negative for fracture or focal lesion. Sinuses/Orbits: No acute finding. Other: None IMPRESSION: 1.  No CT evidence for acute intracranial abnormality. 2. Atrophy and mild chronic small vessel ischemic changes of the white matter. Electronically Signed   By: Donavan Foil M.D.   On:  05/04/2020 21:45   CT CERVICAL SPINE WO CONTRAST  Result Date: 05/04/2020 CLINICAL DATA:  Fall EXAM: CT CERVICAL SPINE WITHOUT CONTRAST TECHNIQUE: Multidetector CT imaging of the cervical spine was performed without intravenous contrast. Multiplanar CT image reconstructions were also generated. COMPARISON:  CT 04/28/2015 FINDINGS: Alignment: Straightening of the cervical spine. No subluxation. Facet alignment within normal limits Skull base and vertebrae: No acute fracture. No primary bone lesion or focal pathologic process. Soft tissues and spinal canal: No prevertebral fluid or swelling. No visible canal hematoma. Disc levels: Diffuse degenerative changes throughout the cervical spine, moderate to severe disease at C4-C5, and C5-C6. Upper chest: Lung apices are clear. 2.3 cm low-density left thyroid nodule, previously evaluated with both ultrasound and biopsy, no further workup recommended. This is stable compared to 2016 CT. Other: Carotid vascular calcification IMPRESSION: Straightening of the cervical spine with diffuse degenerative changes. No acute osseous abnormality. Electronically Signed   By: Donavan Foil M.D.   On: 05/04/2020 21:51   DG Hip Unilat  With Pelvis 2-3 Views Right  Result Date: 05/04/2020 CLINICAL DATA:  Fall.  Hip pain EXAM: DG HIP (WITH OR WITHOUT PELVIS) 2-3V RIGHT COMPARISON:  03/27/2020 FINDINGS: Right hip normal.  No fracture or degenerative change. Left hip replacement in satisfactory position and alignment. No pelvic fracture. Lumbar scoliosis and degenerative changes. IMPRESSION: Negative for fracture. Electronically Signed   By: Franchot Gallo M.D.   On: 05/04/2020 20:07    ED COURSE and MDM  Nursing notes, initial and subsequent vitals signs, including pulse oximetry, reviewed and  interpreted by myself.  Vitals:   05/04/20 1848 05/04/20 1849 05/04/20 2059  BP:  (!) 217/82 (!) 173/72  Pulse:  62 62  Resp:  20 20  Temp:  98.2 F (36.8 C)   TempSrc:  Oral   SpO2:  95% 97%  Weight: 52.6 kg    Height: 5\' 2"  (1.575 m)     Medications - No data to display  Radiographs reassuring and there are no physical findings to suggest an occult fracture of the right hip.  PROCEDURES  Procedures   ED DIAGNOSES     ICD-10-CM   1. Fall in home, initial encounter  W19.XXXA    Y92.009   2. Contusion of coccyx, initial encounter  S30.0XXA   3. Acute exacerbation of chronic low back pain  M54.5    G89.29        Dazha Kempa, Jenny Reichmann, MD 05/04/20 2328

## 2020-05-08 ENCOUNTER — Encounter: Payer: Self-pay | Admitting: Family Medicine

## 2020-05-09 NOTE — Telephone Encounter (Signed)
Set up a virtual visit  

## 2020-05-10 ENCOUNTER — Telehealth: Payer: Self-pay | Admitting: Cardiology

## 2020-05-10 NOTE — Telephone Encounter (Signed)
  Onda Kattner, daughter in law, is calling because patient has had severe back pain since May. She takes Oxycodone multiple times a day. She would like to speak to the nurse because the patient is not eating and there are other issues she would like to discuss.

## 2020-05-10 NOTE — Telephone Encounter (Signed)
Lorra Hals, DIL of the patient called back returning a call from our office.

## 2020-05-10 NOTE — Telephone Encounter (Signed)
LMTCB for Lorra Hals, daughter in law

## 2020-05-11 NOTE — Telephone Encounter (Signed)
LVM for return call. 

## 2020-05-11 NOTE — Telephone Encounter (Signed)
Patients daughter in law calling today per pt's request. She states she has been having debilitating back pain since mid May. She takes hydrocodone 5-325 three x day. She is only comfortable when laying and cannot ambulate well. She has lost 18lbs since mid May when her back began hurting. Pt is concerned with her heart because, she states Dr. Ellyn Hack said "Dont ever be in pain because that can damage your heart." Pts daughter in law has already made an appointment with her PCP and will see him July 5. She is mainly concerned with the lethargy and continued weight loss. She understands Dr. Ellyn Hack will not be able to do anything for her pain, but wanted him to know what is going on.  I advised pts daughter in law to be sure and keep her appt with Dr. Sarajane Jews. If he cannot help with pain relief directly, he will be able to refer appropriately. She verbalized understanding and had no additional questions.

## 2020-05-11 NOTE — Telephone Encounter (Signed)
Patient's daughter returned call.

## 2020-05-11 NOTE — Telephone Encounter (Signed)
Follow up  Pt's daughter in law is calling back to follow up. She said she is hoping to speak with someone before the weekend comes

## 2020-05-12 NOTE — Telephone Encounter (Signed)
Yes my indication was that pain causes stress to the heart not necessarily damage.  I am truly sorry that she is having this back pain issue.  Hopefully her PCP or pain management can help get her back on her feet.  Also little bit concerned about the leg.  Thanks for the update  Glenetta Hew, MD

## 2020-05-14 ENCOUNTER — Other Ambulatory Visit: Payer: Self-pay | Admitting: Family Medicine

## 2020-05-16 ENCOUNTER — Telehealth (INDEPENDENT_AMBULATORY_CARE_PROVIDER_SITE_OTHER): Payer: Medicare PPO | Admitting: Family Medicine

## 2020-05-16 ENCOUNTER — Encounter: Payer: Self-pay | Admitting: Family Medicine

## 2020-05-16 VITALS — Ht 62.0 in | Wt 110.0 lb

## 2020-05-16 DIAGNOSIS — M1990 Unspecified osteoarthritis, unspecified site: Secondary | ICD-10-CM

## 2020-05-16 DIAGNOSIS — M549 Dorsalgia, unspecified: Secondary | ICD-10-CM | POA: Diagnosis not present

## 2020-05-16 DIAGNOSIS — G8929 Other chronic pain: Secondary | ICD-10-CM | POA: Diagnosis not present

## 2020-05-16 DIAGNOSIS — F418 Other specified anxiety disorders: Secondary | ICD-10-CM | POA: Diagnosis not present

## 2020-05-16 MED ORDER — TEMAZEPAM 15 MG PO CAPS: ORAL_CAPSULE | ORAL | 5 refills | Status: DC

## 2020-05-16 MED ORDER — SERTRALINE HCL 50 MG PO TABS
50.0000 mg | ORAL_TABLET | Freq: Every day | ORAL | 2 refills | Status: DC
Start: 2020-05-16 — End: 2020-08-07

## 2020-05-16 NOTE — Progress Notes (Signed)
Subjective:    Patient ID: [name], female    DOB: October 23, 1924, 86 y.o.   MRN: [id number]  HPI Virtual Visit via Video Note  I connected with the patient on 05/16/20 at 11:00 AM EDT by a video enabled telemedicine application and verified that I am speaking with the correct person using two identifiers.  Location patient: home Location provider:work or home office Persons participating in the virtual visit: patient, provider  I discussed the limitations of evaluation and management by telemedicine and the availability of in person appointments. The patient expressed understanding and agreed to proceed.   HPI: Here with her daughter to discuss her severe low back pain, along with anxiety and depression. She had 3 steroid injections to the lumbar spine per Dr. Nelva Bush in June but these did not help. She is taking Norco 5-325 4 to 6 times daily with little relief. Dr. Nelva Bush tried her on Duloxetine, but she qucikly stopped this due to nausea. She now spends all her time in bed, and she admits to being depressed. Her appetiteis poor, and she is losing a lot of weight.    ROS: See pertinent positives and negatives per HPI.  Past Medical History:  Diagnosis Date  . Allergy   . Anemia   . Anxiety   . Arthritis   . Asthma   . Benign positional vertigo   . Blood transfusion without reported diagnosis   . Cardiac pacemaker in situ 05/01/2015   for syncope & Sx Bradycardia -> Medtronic MRI compatible pacemaker placed Dr. Caryl Comes   . Cataract   . Chronic diarrhea   . Chronic kidney disease, stage IV (severe) ([place])   . Colon polyps   . Diastolic dysfunction, left ventricle 11/03/2013  . Diverticulosis   . Esophageal stricture   . GERD (gastroesophageal reflux disease)   . Gout   . Heart murmur   . Hemorrhoids   . Hyperlipidemia   . Hypertensive heart disease    no significant RAS by MRA 2002- (<30% LRAS)   . Hypothyroidism   . IBS (irritable bowel syndrome)   . LBBB (left  bundle branch block)- new 11/01/13 11/01/2013  . Paroxysmal atrial fibrillation 11/01/2013   Intermittent through the years and recurrent in December of 2014 treated with amiodarone;; Negative Myoview 10/2013  . Peripheral vascular disease    10-17% LICA, 5-10% RICA by doppler 2009   . Sinus arrest 04/28/2015  . Syncope and collapse 04/28/2015   s/p PPM  . Tubulovillous adenoma 4/07    Past Surgical History:  Procedure Laterality Date  . APPENDECTOMY    . CATARACT EXTRACTION    . EP IMPLANTABLE DEVICE N/A 05/01/2015   Procedure: Pacemaker Implant;  Surgeon: Deboraha Sprang, MD;  Location: [facility];  Service: Cardiovascular;  Laterality: N/A;  . HIP ARTHROPLASTY Left 05/01/2015   Procedure: ARTHROPLASTY BIPOLAR HIP (HEMIARTHROPLASTY);  Surgeon: Rod Can, MD;  Location: [facility];  Service: Orthopedics;  Laterality: Left;  . LOW ANTERIOR BOWEL RESECTION  7/08  . [place] SURGERY  2000  . NM MYOVIEW LTD  03/18/2013   Negative for ischemia or infarction. EF 55%.  LOW RISK  . OOPHORECTOMY  1962   right  . squamous cell skin cancer removed    . TRANSTHORACIC ECHOCARDIOGRAM  10/2013, 02/2017   A) EF 55-60%, mild LVH, elevated bili pressures. Mild aortic valve calcification;; B) Normal LV size and function with mild LVH. Unable to assess diastolic function. Normal PA pressures. Mild  MR,Mild AI    Family History  Problem Relation Age of Onset  . Cancer Mother 49       unknown  . Heart attack Father   . Heart disease Brother   . Stroke Maternal Grandmother   . Lung cancer Maternal Grandfather   . Stroke Paternal Grandmother   . Colon cancer Daughter   . Heart attack Son   . Esophageal cancer Neg Hx   . Rectal cancer Neg Hx   . Stomach cancer Neg Hx      Current Outpatient Medications:  .  acetaminophen (TYLENOL) 325 MG tablet, Take 2 tablets (650 mg total) by mouth every 6 (six) hours as needed for mild pain (or Fever >/= 101)., Disp: , Rfl:  .  CVS VITAMIN B12 1000  MCG tablet, TAKE 1 TABLET BY MOUTH EVERY DAY (Patient taking differently: Take 1,000 mcg by mouth daily. ), Disp: 90 tablet, Rfl: 3 .  ELIQUIS 2.5 MG TABS tablet, TAKE 1 TABLET BY MOUTH TWICE A DAY, Disp: 180 tablet, Rfl: 1 .  febuxostat (ULORIC) 40 MG tablet, TAKE 1 TABLET BY MOUTH EVERY OTHER DAY (Patient taking differently: Take 40 mg by mouth 3 (three) times a week. ), Disp: 45 tablet, Rfl: 3 .  furosemide (LASIX) 40 MG tablet, TAKE 0.5 TABS DAILY AS NEEDED. TAKE 40MG  ONLY IF MORE THAN 2-3 POUNDS OVERNIGHT AND/OR 5 POUNDS/WK (Patient taking differently: Take 20-40 mg by mouth as needed for fluid. ), Disp: 30 tablet, Rfl: 6 .  hydrALAZINE (APRESOLINE) 25 MG tablet, TAKE 1 TAB 2 TIMES DAILY. TAKE THIS 25 MG DOSE ALONG WITH YOUR 50 MG DOSE (75 MG TOTAL) TWICE DAILY. (Patient taking differently: Take 25 mg by mouth 2 (two) times daily. ), Disp: 180 tablet, Rfl: 0 .  HYDROcodone-acetaminophen (NORCO/VICODIN) 5-325 MG tablet, Take 1 tablet by mouth every 6 (six) hours as needed for severe pain., Disp: 10 tablet, Rfl: 0 .  hyoscyamine (LEVSIN SL) 0.125 MG SL tablet, TAKE 1-2 TABLETS BY MOUTH EVERY 4 HOURS AS NEEDED (Patient taking differently: Take 0.125-0.25 mg by mouth every 4 (four) hours as needed for cramping. TAKE 1-2 TABLETS BY MOUTH EVERY 4 HOURS AS NEEDED), Disp: 120 tablet, Rfl: 11 .  levothyroxine (SYNTHROID) 88 MCG tablet, TAKE 1 TABLET (88 MCG TOTAL) BY MOUTH DAILY BEFORE BREAKFAST. (Patient taking differently: Take 88 mcg by mouth daily before breakfast. TAKE 1 TABLET (88 MCG TOTAL) BY MOUTH DAILY BEFORE BREAKFAST.), Disp: 90 tablet, Rfl: 3 .  loperamide (IMODIUM) 2 MG capsule, Take 2 mg by mouth as needed for diarrhea or loose stools., Disp: , Rfl:  .  LORazepam (ATIVAN) 0.5 MG tablet, Take 1 tablet (0.5 mg total) by mouth every 8 (eight) hours as needed. (Patient taking differently: Take 0.5 mg by mouth every 8 (eight) hours as needed for anxiety. ), Disp: 90 tablet, Rfl: 5 .  MYRBETRIQ 25 MG  TB24 tablet, Take 25 mg by mouth every other day. , Disp: , Rfl:  .  nitroGLYCERIN (NITROSTAT) 0.4 MG SL tablet, Place 1 tablet (0.4 mg total) under the tongue every 5 (five) minutes x 3 doses as needed for chest pain., Disp: 25 tablet, Rfl: 3 .  omeprazole (PRILOSEC) 40 MG capsule, Take 1 capsule (40 mg total) by mouth 2 (two) times daily. NEEDS APPT IN [facility]IL, Disp: 60 capsule, Rfl: 11 .  Polyethylene Glycol 3350 (MIRALAX PO), Take 17 g by mouth as needed (constipation). , Disp: , Rfl:  .  Potassium Chloride ER 20  MEQ TBCR, Take 20 mEq by mouth daily as needed. Take only if you are taking your Lasix that day., Disp: 14 tablet, Rfl: 3 .  temazepam (RESTORIL) 15 MG capsule, TAKE 2 CAPSULES AT BEDTIME AS NEEDED FOR SLEEP, Disp: 60 capsule, Rfl: 5 .  hydrALAZINE (APRESOLINE) 50 MG tablet, TAKE 1 TABLET (50 MG TOTAL) BY MOUTH 2 (TWO) TIMES DAILY. TAKE THIS 50 MG DOSE ALONG WITH YOUR 25 MG DOSE (75 MG TOTAL) TWICE DAILY., Disp: 180 tablet, Rfl: 3 .  sertraline (ZOLOFT) 50 MG tablet, Take 1 tablet (50 mg total) by mouth daily., Disp: 30 tablet, Rfl: 2  EXAM:  VITALS per patient if applicable:  GENERAL: alert, oriented, appears well and in no acute distress  HEENT: atraumatic, conjunttiva clear, no obvious abnormalities on inspection of external nose and ears  NECK: normal movements of the head and neck  LUNGS: on inspection no signs of respiratory distress, breathing rate appears normal, no obvious gross SOB, gasping or wheezing  CV: no obvious cyanosis  MS: moves all visible extremities without noticeable abnormality  PSYCH/NEURO: pleasant and cooperative, no obvious depression or anxiety, speech and thought processing grossly intact  ASSESSMENT AND PLAN: For the back pain, she will follow up with Dr. Nelva Bush tomorrow. For the depression and anxiety, she will try Zoloft 50 mg daily. Recheck with me in 2 weeks.  [name], MD  Discussed the following assessment and plan:  No diagnosis  found.     I discussed the assessment and treatment plan with the patient. The patient was provided an opportunity to ask questions and all were answered. The patient agreed with the plan and demonstrated an understanding of the instructions.   The patient was advised to call back or seek an in-person evaluation if the symptoms worsen or if the condition fails to improve as anticipated.     Review of Systems     Objective:   Physical Exam        Assessment & Plan:

## 2020-05-17 ENCOUNTER — Ambulatory Visit
Admission: RE | Admit: 2020-05-17 | Discharge: 2020-05-17 | Disposition: A | Discharge status: Home / Self Care | Payer: Medicare PPO | Source: Ambulatory Visit | Attending: Chiropractic Medicine | Admitting: Chiropractic Medicine

## 2020-05-17 ENCOUNTER — Other Ambulatory Visit: Payer: Self-pay

## 2020-05-17 DIAGNOSIS — M545 Low back pain, unspecified: Secondary | ICD-10-CM

## 2020-05-17 DIAGNOSIS — M5136 Other intervertebral disc degeneration, lumbar region: Secondary | ICD-10-CM | POA: Diagnosis not present

## 2020-05-23 DIAGNOSIS — M5136 Other intervertebral disc degeneration, lumbar region: Secondary | ICD-10-CM | POA: Diagnosis not present

## 2020-05-26 ENCOUNTER — Other Ambulatory Visit: Payer: Self-pay

## 2020-05-26 ENCOUNTER — Emergency Department (HOSPITAL_COMMUNITY)
Admission: EM | Admit: 2020-05-26 | Discharge: 2020-05-27 | Disposition: A | Discharge status: Home / Self Care | Payer: Medicare PPO | Attending: Emergency Medicine | Admitting: Emergency Medicine

## 2020-05-26 ENCOUNTER — Encounter (HOSPITAL_COMMUNITY): Payer: Self-pay

## 2020-05-26 DIAGNOSIS — Z79890 Hormone replacement therapy (postmenopausal): Secondary | ICD-10-CM | POA: Insufficient documentation

## 2020-05-26 DIAGNOSIS — Z7901 Long term (current) use of anticoagulants: Secondary | ICD-10-CM | POA: Diagnosis not present

## 2020-05-26 DIAGNOSIS — M545 Low back pain, unspecified: Secondary | ICD-10-CM

## 2020-05-26 DIAGNOSIS — I5032 Chronic diastolic (congestive) heart failure: Secondary | ICD-10-CM | POA: Insufficient documentation

## 2020-05-26 DIAGNOSIS — Z950 Presence of cardiac pacemaker: Secondary | ICD-10-CM | POA: Insufficient documentation

## 2020-05-26 DIAGNOSIS — D649 Anemia, unspecified: Secondary | ICD-10-CM

## 2020-05-26 DIAGNOSIS — J449 Chronic obstructive pulmonary disease, unspecified: Secondary | ICD-10-CM | POA: Insufficient documentation

## 2020-05-26 DIAGNOSIS — N289 Disorder of kidney and ureter, unspecified: Secondary | ICD-10-CM | POA: Diagnosis not present

## 2020-05-26 DIAGNOSIS — I700 Atherosclerosis of aorta: Secondary | ICD-10-CM | POA: Diagnosis not present

## 2020-05-26 DIAGNOSIS — K449 Diaphragmatic hernia without obstruction or gangrene: Secondary | ICD-10-CM | POA: Diagnosis not present

## 2020-05-26 DIAGNOSIS — E039 Hypothyroidism, unspecified: Secondary | ICD-10-CM | POA: Diagnosis not present

## 2020-05-26 DIAGNOSIS — Z79899 Other long term (current) drug therapy: Secondary | ICD-10-CM | POA: Insufficient documentation

## 2020-05-26 DIAGNOSIS — I132 Hypertensive heart and chronic kidney disease with heart failure and with stage 5 chronic kidney disease, or end stage renal disease: Secondary | ICD-10-CM | POA: Diagnosis not present

## 2020-05-26 DIAGNOSIS — N200 Calculus of kidney: Secondary | ICD-10-CM | POA: Diagnosis not present

## 2020-05-26 DIAGNOSIS — R630 Anorexia: Secondary | ICD-10-CM

## 2020-05-26 DIAGNOSIS — H81399 Other peripheral vertigo, unspecified ear: Secondary | ICD-10-CM

## 2020-05-26 DIAGNOSIS — N185 Chronic kidney disease, stage 5: Secondary | ICD-10-CM | POA: Insufficient documentation

## 2020-05-26 LAB — CBC
HCT: 36.9 % (ref 36.0–46.0)
Hemoglobin: 11.8 g/dL — ABNORMAL LOW (ref 12.0–15.0)
MCH: 31.1 pg (ref 26.0–34.0)
MCHC: 32 g/dL (ref 30.0–36.0)
MCV: 97.4 fL (ref 80.0–100.0)
Platelets: 288 10*3/uL (ref 150–400)
RBC: 3.79 MIL/uL — ABNORMAL LOW (ref 3.87–5.11)
RDW: 14.9 % (ref 11.5–15.5)
WBC: 5.4 10*3/uL (ref 4.0–10.5)
nRBC: 0 % (ref 0.0–0.2)

## 2020-05-26 LAB — BASIC METABOLIC PANEL
Anion gap: 12 (ref 5–15)
BUN: 23 mg/dL (ref 8–23)
CO2: 29 mmol/L (ref 22–32)
Calcium: 8.7 mg/dL — ABNORMAL LOW (ref 8.9–10.3)
Chloride: 99 mmol/L (ref 98–111)
Creatinine, Ser: 1.38 mg/dL — ABNORMAL HIGH (ref 0.44–1.00)
GFR calc Af Amer: 38 mL/min — ABNORMAL LOW (ref >60)
GFR calc non Af Amer: 33 mL/min — ABNORMAL LOW (ref >60)
Glucose, Bld: 110 mg/dL — ABNORMAL HIGH (ref 70–99)
Potassium: 3.9 mmol/L (ref 3.5–5.1)
Sodium: 140 mmol/L (ref 135–145)

## 2020-05-26 MED ORDER — SODIUM CHLORIDE 0.9% FLUSH: 3.0000 mL | Freq: Once | INTRAVENOUS | Status: DC

## 2020-05-26 NOTE — ED Triage Notes (Addendum)
Pt arrives to ED w/ c/o dizziness and nausea that has been ongoing for the last 2 weeks. Per family pt has been feeling more lethargic and fatigued over the last two weeks. Pt has had decreased appetite and has lost 20 lbs during that time. Pt has pacemaker and hx of anemia.

## 2020-05-27 ENCOUNTER — Other Ambulatory Visit: Payer: Self-pay

## 2020-05-27 ENCOUNTER — Emergency Department (HOSPITAL_COMMUNITY): Payer: Medicare PPO

## 2020-05-27 DIAGNOSIS — H81399 Other peripheral vertigo, unspecified ear: Secondary | ICD-10-CM | POA: Diagnosis not present

## 2020-05-27 DIAGNOSIS — N200 Calculus of kidney: Secondary | ICD-10-CM | POA: Diagnosis not present

## 2020-05-27 DIAGNOSIS — K449 Diaphragmatic hernia without obstruction or gangrene: Secondary | ICD-10-CM | POA: Diagnosis not present

## 2020-05-27 DIAGNOSIS — I700 Atherosclerosis of aorta: Secondary | ICD-10-CM | POA: Diagnosis not present

## 2020-05-27 DIAGNOSIS — D649 Anemia, unspecified: Secondary | ICD-10-CM | POA: Diagnosis not present

## 2020-05-27 DIAGNOSIS — N289 Disorder of kidney and ureter, unspecified: Secondary | ICD-10-CM | POA: Diagnosis not present

## 2020-05-27 LAB — URINALYSIS, ROUTINE W REFLEX MICROSCOPIC
Bilirubin Urine: NEGATIVE
Glucose, UA: NEGATIVE mg/dL
Ketones, ur: NEGATIVE mg/dL
Leukocytes,Ua: NEGATIVE
Nitrite: NEGATIVE
Protein, ur: 30 mg/dL — AB
Specific Gravity, Urine: 1.021 (ref 1.005–1.030)
pH: 5 (ref 5.0–8.0)

## 2020-05-27 LAB — HEPATIC FUNCTION PANEL
ALT: 11 U/L (ref 0–44)
AST: 17 U/L (ref 15–41)
Albumin: 3.5 g/dL (ref 3.5–5.0)
Alkaline Phosphatase: 112 U/L (ref 38–126)
Bilirubin, Direct: 0.2 mg/dL (ref 0.0–0.2)
Indirect Bilirubin: 0.4 mg/dL (ref 0.3–0.9)
Total Bilirubin: 0.6 mg/dL (ref 0.3–1.2)
Total Protein: 7.1 g/dL (ref 6.5–8.1)

## 2020-05-27 MED ORDER — ONDANSETRON HCL 4 MG/2ML IJ SOLN
4.0000 mg | Freq: Once | INTRAMUSCULAR | Status: AC
  Administered 2020-05-27: 4 mg via INTRAVENOUS
  Filled 2020-05-27: qty 2

## 2020-05-27 MED ORDER — SODIUM CHLORIDE 0.9 % IV BOLUS
1000.0000 mL | Freq: Once | INTRAVENOUS | Status: AC
  Administered 2020-05-27: 1000 mL via INTRAVENOUS

## 2020-05-27 MED ORDER — MECLIZINE HCL 25 MG PO TABS
25.0000 mg | ORAL_TABLET | Freq: Once | ORAL | Status: AC
  Administered 2020-05-27: 25 mg via ORAL
  Filled 2020-05-27: qty 1

## 2020-05-27 NOTE — ED Notes (Signed)
Pt verbalized understanding.

## 2020-05-27 NOTE — Discharge Instructions (Addendum)
Drink plenty of fluids.  Follow up with your primary care provider.  Return if you are having any problems.

## 2020-05-27 NOTE — ED Provider Notes (Signed)
[facility] EMERGENCY DEPARTMENT Provider Note   CSN: [id number] Arrival date & time: 05/26/20  1924   History Chief Complaint  Patient presents with   Nausea   Dizziness    [name] is a 86 y.o. female.  The history is provided by the patient and a relative.  Dizziness She has history hypertension, hyperlipidemia, atrial fibrillation anticoagulated on apixaban, benign positional vertigo, cardiac pacemaker, chronic back pain and has been having right lumbar pain for about the last 6-8weeks.  She has been taking hydrocodone-acetaminophen every 4 hours for this pain.  At about the same time, she started having episodes of vertigo which are worse with any movement.  There has been associated nausea and vomiting.  She has been taking ondansetron which does give temporary relief of nausea.  During this time, she has had about a 20 pound involuntary weight loss.  She describes anorexia.  Her PCP had obtained CT of head, cervical spine, lumbar spine with no serious pathology identified.  Daughter is concerned about anorexia, vomiting, weight loss.  Past Medical History:  Diagnosis Date   Allergy    Anemia    Anxiety    Arthritis    Asthma    Benign positional vertigo    Blood transfusion without reported diagnosis    Cardiac pacemaker in situ 05/01/2015   for syncope & Sx Bradycardia -> Medtronic MRI compatible pacemaker placed Dr. Caryl Comes    Cataract    Chronic diarrhea    Chronic kidney disease, stage IV (severe) (HCC)    Colon polyps    Diastolic dysfunction, left ventricle 11/03/2013   Diverticulosis    Esophageal stricture    GERD (gastroesophageal reflux disease)    Gout    Heart murmur    Hemorrhoids    Hyperlipidemia    Hypertensive heart disease    no significant RAS by MRA 2002- (<30% LRAS)    Hypothyroidism    IBS (irritable bowel syndrome)    LBBB (left bundle branch block)- new 11/01/13 11/01/2013   Paroxysmal  atrial fibrillation 11/01/2013   Intermittent through the years and recurrent in December of 2014 treated with amiodarone;; Negative Myoview 10/2013   Peripheral vascular disease    63-87% LICA, 5-64% RICA by doppler 2009    Sinus arrest 04/28/2015   Syncope and collapse 04/28/2015   s/p PPM   Tubulovillous adenoma 4/07    Patient Active Problem List   Diagnosis Date Noted   Depression with anxiety 05/16/2020   Orthostatic dizziness 04/21/2019   Malnutrition of moderate degree 12/13/2017   Pressure injury of skin 12/12/2017   Hypokalemia 12/11/2017   Acute kidney injury superimposed on CKD ([place]) 12/11/2017   COPD (chronic obstructive pulmonary disease) ([place]) 12/11/2017   Hypocalcemia 12/11/2017   Hypoxia 12/05/2017   Chronic back pain 07/16/2017   Iron deficiency anemia 03/24/2017   IDA (iron deficiency anemia) 03/20/2017   Dyspnea on exertion 02/21/2017   Insomnia, persistent 07/26/2015   Cardiac pacemaker in situ    Right bundle branch block (RBBB) intermittent  04/28/2015   Closed left hip fracture ([place])    Syncope, cardiogenic    Abnormal liver function tests 33/29/5188   Systolic hypertension, isolated; difficult to control 10/04/2014   CKD (chronic kidney disease), stage IV ([place]) 07/31/2014   Gout 06/17/2014   Edema of both legs 02/09/2014   Hypertensive heart/kidney disease w/chronic kidney disease stage IV ([place]) 12/25/2013   Hypertensive heart and chronic kidney disease with chronic diastolic congestive heart  failure ([place]) 12/03/2013   H/O tachycardia-bradycardia syndrome    Chronic anticoagulation- Eliquis 11/17/2013   LBBB (left bundle branch block)- new 11/01/13 11/01/2013   Persistent Paroxysmal atrial fibrillation: CHA2DS2-VASc Score 5, on Eliquis 11/01/2013   Peripheral vascular disease    Benign positional vertigo    Irritable bowel syndrome 03/22/2009   Anxiety state    Reactive airway disease 01/13/2008   Chronic  anemia    Osteoarthritis    Hypothyroidism    Hyperlipidemia    Hypertensive heart disease     Past Surgical History:  Procedure Laterality Date   APPENDECTOMY     CATARACT EXTRACTION     EP IMPLANTABLE DEVICE N/A 05/01/2015   Procedure: Pacemaker Implant;  Surgeon: Deboraha Sprang, MD;  Location: [place] [facility];  Service: Cardiovascular;  Laterality: N/A;   HIP ARTHROPLASTY Left 05/01/2015   Procedure: ARTHROPLASTY BIPOLAR HIP (HEMIARTHROPLASTY);  Surgeon: Rod Can, MD;  Location: ;  Service: Orthopedics;  Laterality: Left;   LOW ANTERIOR BOWEL RESECTION  7/08   LUMBAR [place] SURGERY  2000   NM MYOVIEW LTD  03/18/2013   Negative for ischemia or infarction. EF 55%.  LOW RISK   OOPHORECTOMY  1962   right   squamous cell skin cancer removed     TRANSTHORACIC ECHOCARDIOGRAM  10/2013, 02/2017   A) EF 55-60%, mild LVH, elevated bili pressures. Mild aortic valve calcification;; B) Normal LV size and function with mild LVH. Unable to assess diastolic function. Normal PA pressures. Mild MR,Mild AI     OB History   No obstetric history on file.     Family History  Problem Relation Age of Onset   Cancer Mother 56       unknown   Heart attack Father    Heart disease Brother    Stroke Maternal Grandmother    Lung cancer Maternal Grandfather    Stroke Paternal Grandmother    Colon cancer Daughter    Heart attack Son    Esophageal cancer Neg Hx    Rectal cancer Neg Hx    Stomach cancer Neg Hx     Social History   Tobacco Use   Smoking status: Never Smoker   Smokeless tobacco: Never Used   Tobacco comment: never used tobacco  Vaping Use   Vaping Use: Never used  Substance Use Topics   Alcohol use: No    Alcohol/week: 0.0 standard drinks   Drug use: No    Home Medications Prior to Admission medications   Medication Sig Start Date End Date Taking? Authorizing Provider  acetaminophen (TYLENOL) 325 MG tablet Take 2 tablets (650 mg  total) by mouth every 6 (six) hours as needed for mild pain (or Fever >/= 101). 12/17/17   Debbe Odea, MD  CVS VITAMIN B12 1000 MCG tablet TAKE 1 TABLET BY MOUTH EVERY DAY Patient taking differently: Take 1,000 mcg by mouth daily.  06/22/19   [name], MD  ELIQUIS 2.5 MG TABS tablet TAKE 1 TABLET BY MOUTH TWICE A DAY 12/14/19   [name], MD  febuxostat (ULORIC) 40 MG tablet TAKE 1 TABLET BY MOUTH EVERY OTHER DAY Patient taking differently: Take 40 mg by mouth 3 (three) times a week.  12/14/19   [name], MD  furosemide (LASIX) 40 MG tablet TAKE 0.5 TABS DAILY AS NEEDED. TAKE 40MG  ONLY IF MORE THAN 2-3 POUNDS OVERNIGHT AND/OR 5 POUNDS/WK Patient taking differently: Take 20-40 mg by mouth as needed for fluid.  02/18/20  [name], MD  hydrALAZINE (APRESOLINE) 25 MG tablet TAKE 1 TAB 2 TIMES DAILY. TAKE THIS 25 MG DOSE ALONG WITH YOUR 50 MG DOSE (75 MG TOTAL) TWICE DAILY. Patient taking differently: Take 25 mg by mouth 2 (two) times daily.  03/09/20   [name], MD  hydrALAZINE (APRESOLINE) 50 MG tablet TAKE 1 TABLET (50 MG TOTAL) BY MOUTH 2 (TWO) TIMES DAILY. TAKE THIS 50 MG DOSE ALONG WITH YOUR 25 MG DOSE (75 MG TOTAL) TWICE DAILY. 08/24/19 03/27/20  Erlene Quan, PA-C  HYDROcodone-acetaminophen (NORCO/VICODIN) 5-325 MG tablet Take 1 tablet by mouth every 6 (six) hours as needed for severe pain. 03/27/20   Hayden Rasmussen, MD  hyoscyamine (LEVSIN SL) 0.125 MG SL tablet TAKE 1-2 TABLETS BY MOUTH EVERY 4 HOURS AS NEEDED Patient taking differently: Take 0.125-0.25 mg by mouth every 4 (four) hours as needed for cramping. TAKE 1-2 TABLETS BY MOUTH EVERY 4 HOURS AS NEEDED 02/16/20   Ladene Artist, MD  levothyroxine (SYNTHROID) 88 MCG tablet TAKE 1 TABLET (88 MCG TOTAL) BY MOUTH DAILY BEFORE BREAKFAST. Patient taking differently: Take 88 mcg by mouth daily before breakfast. TAKE 1 TABLET (88 MCG TOTAL) BY MOUTH DAILY BEFORE BREAKFAST. 08/24/19   [name], MD  loperamide  (IMODIUM) 2 MG capsule Take 2 mg by mouth as needed for diarrhea or loose stools.    [provider]  LORazepam (ATIVAN) 0.5 MG tablet Take 1 tablet (0.5 mg total) by mouth every 8 (eight) hours as needed. Patient taking differently: Take 0.5 mg by mouth every 8 (eight) hours as needed for anxiety.  12/10/19   [name], MD  MYRBETRIQ 25 MG TB24 tablet Take 25 mg by mouth every other day.  02/24/20   [provider]  nitroGLYCERIN (NITROSTAT) 0.4 MG SL tablet Place 1 tablet (0.4 mg total) under the tongue every 5 (five) minutes x 3 doses as needed for chest pain. 01/01/19   [name], MD  omeprazole (PRILOSEC) 40 MG capsule Take 1 capsule (40 mg total) by mouth 2 (two) times daily. NEEDS APPT IN APRIL 03/06/20   Ladene Artist, MD  Polyethylene Glycol 3350 (MIRALAX PO) Take 17 g by mouth as needed (constipation).     [provider]  Potassium Chloride ER 20 MEQ TBCR Take 20 mEq by mouth daily as needed. Take only if you are taking your Lasix that day. 12/13/19   [name], MD  sertraline (ZOLOFT) 50 MG tablet Take 1 tablet (50 mg total) by mouth daily. 05/16/20   [name], MD  temazepam (RESTORIL) 15 MG capsule TAKE 2 CAPSULES AT BEDTIME AS NEEDED FOR SLEEP 05/16/20   [name], MD    Allergies    Penicillins, Xifaxan [rifaximin], Ambien [zolpidem tartrate], Amoxicillin, Ceftin [cefuroxime axetil], Codeine phosphate, Colchicine, Levofloxacin, and Lipitor [atorvastatin]  Review of Systems   Review of Systems  Neurological: Positive for dizziness.  All other systems reviewed and are negative.   Physical Exam Updated Vital Signs BP (!) 156/62 (BP Location: Left Arm)    Pulse 62    Temp 97.9 F (36.6 C)    Resp 16    SpO2 100%   Physical Exam Vitals and nursing note reviewed.   86 year old female, resting comfortably and in no acute distress. Vital signs are significant for elevated blood pressure. Oxygen saturation is 100%, which is  normal. Head is normocephalic and atraumatic.  Status post right iridectomy with  eccentric pupil on that I, EOMI. Oropharynx is clear.  There is no nystagmus. Neck is nontender and supple without adenopathy or JVD. Back is nontender in the midline.  There is mild right CVA tenderness.  Straight leg raise is positive on the right at 45 degrees. Lungs are clear without rales, wheezes, or rhonchi. Chest is nontender. Heart has regular rate and rhythm without murmur. Abdomen is soft, flat, nontender without masses or hepatosplenomegaly and peristalsis is normoactive. Extremities have no cyanosis or edema, full range of motion is present. Skin is warm and dry without rash. Neurologic: Mental status is normal, cranial nerves are intact, there are no motor or sensory deficits.  No dizziness elicited with passive head movement.  ED Results / Procedures / Treatments   Labs (all labs ordered are listed, but only abnormal results are displayed) Labs Reviewed  BASIC METABOLIC PANEL - Abnormal; Notable for the following components:      Result Value   Glucose, Bld 110 (*)    Creatinine, Ser 1.38 (*)    Calcium 8.7 (*)    GFR calc non Af Amer 33 (*)    GFR calc Af Amer 38 (*)    All other components within normal limits  CBC - Abnormal; Notable for the following components:   RBC 3.79 (*)    Hemoglobin 11.8 (*)    All other components within normal limits  URINALYSIS, ROUTINE W REFLEX MICROSCOPIC  HEPATIC FUNCTION PANEL  CBG MONITORING, ED    EKG EKG Interpretation  Date/Time:  Friday May 26 2020 20:01:31 EDT Ventricular Rate:  62 PR Interval:    QRS Duration: 152 QT Interval:  468 QTC Calculation: 475 R Axis:   -85 Text Interpretation: Ventricular-paced rhythm Abnormal ECG When compared with ECG of 03/27/2020, No significant change was found Confirmed by Delora Fuel ([id number]) on 05/27/2020 12:15:40 AM   Radiology No results found.  Procedures Procedures   Medications Ordered in  ED Medications  sodium chloride flush (NS) 0.9 % injection 3 mL (has no administration in time range)  sodium chloride 0.9 % bolus 1,000 mL (has no administration in time range)  ondansetron (ZOFRAN) injection 4 mg (has no administration in time range)  meclizine (ANTIVERT) tablet 25 mg (has no administration in time range)    ED Course  I have reviewed the triage vital signs and the nursing notes.  Pertinent labs & imaging results that were available during my care of the patient were reviewed by me and considered in my medical decision making (see chart for details).  MDM Rules/Calculators/A&P Right flank pain of uncertain cause.  This may indeed be musculoskeletal pain but it is not clear that that is all it is.  Labs are actually reassuring.  Renal insufficiency is unchanged from baseline and electrolytes are normal.  Mild anemia is present which is actually improved over baseline.  Will check hepatic function studies to look for evidence of liver pathology, will send for renal stone protocol CT scan to evaluate the rest of the abdomen.  She will be given IV fluids, ondansetron, and oral meclizine.  Old records are reviewed confirming recent CT scans as noted in history of present illness.  Hepatic function studies are normal.  CT of abdomen and pelvis did not show any obvious cause for symptoms.  Patient and daughter advised of these findings.  Of note, urinalysis does have many bacteria but 0-5 WBCs and she clinically does not have a UTI, so antibiotics are not initiated.  There  is no significant change in symptoms with meclizine.  She is referred back to her PCP for further outpatient work-up.  Final Clinical Impression(s) / ED Diagnoses Final diagnoses:  Right-sided low back pain without sciatica, unspecified chronicity  Anorexia  Renal insufficiency  Normochromic normocytic anemia    Rx / DC Orders ED Discharge Orders    None       Delora Fuel, MD 91/50/41 [phone]

## 2020-05-29 NOTE — Progress Notes (Deleted)
Cardiology Office Note   Date:  05/29/2020   ID:  [name], DOB 1924/06/23, MRN [id number]  PCP:  [name], MD  Cardiologist:  Dr. Ellyn Hack  No chief complaint on file.    History of Present Illness: [name] is a 86 y.o. female who presents for ongoing assessment and management of hypertension, longstanding persistent atrial fibrillation, tachybradycardia syndrome status post permanent pacemaker, diastolic CHF. Last echocardiogram April 2018 revealed normal LVEF, mild LVH, severe LAE. She also had a Lexiscan Myoview that was found to be low risk.  On last encounter via telephone virtual visit, the patient's blood pressures have been more labile, and more often related to anxiety and musculoskeletal pain. She has a good bit of musculoskeletal pain in general from arthritis. She was advised that if her blood pressure remained elevated greater than 160/70 she could take an additional 25 mg of hydralazine x1. She was to continue her current regimen of 75 mg of hydralazine twice daily, and amlodipine 2.5 mg daily. Ongoing assessment and management  Past Medical History:  Diagnosis Date  . Allergy   . Anemia   . Anxiety   . Arthritis   . Asthma   . Benign positional vertigo   . Blood transfusion without reported diagnosis   . Cardiac pacemaker in situ 05/01/2015   for syncope & Sx Bradycardia -> Medtronic MRI compatible pacemaker placed Dr. Caryl Comes   . Cataract   . Chronic diarrhea   . Chronic kidney disease, stage IV (severe) ([place])   . Colon polyps   . Diastolic dysfunction, left ventricle 11/03/2013  . Diverticulosis   . Esophageal stricture   . GERD (gastroesophageal reflux disease)   . Gout   . Heart murmur   . Hemorrhoids   . Hyperlipidemia   . Hypertensive heart disease    no significant RAS by MRA 2002- (<30% LRAS)   . Hypothyroidism   . IBS (irritable bowel syndrome)   . LBBB (left bundle branch block)- new 11/01/13 11/01/2013  . Paroxysmal atrial  fibrillation 11/01/2013   Intermittent through the years and recurrent in December of 2014 treated with amiodarone;; Negative Myoview 10/2013  . Peripheral vascular disease    23-53% LICA, 6-14% RICA by doppler 2009   . Sinus arrest 04/28/2015  . Syncope and collapse 04/28/2015   s/p PPM  . Tubulovillous adenoma 4/07    Past Surgical History:  Procedure Laterality Date  . APPENDECTOMY    . CATARACT EXTRACTION    . EP IMPLANTABLE DEVICE N/A 05/01/2015   Procedure: Pacemaker Implant;  Surgeon: Deboraha Sprang, MD;  Location: [facility];  Service: Cardiovascular;  Laterality: N/A;  . HIP ARTHROPLASTY Left 05/01/2015   Procedure: ARTHROPLASTY BIPOLAR HIP (HEMIARTHROPLASTY);  Surgeon: Rod Can, MD;  Location: [facility];  Service: Orthopedics;  Laterality: Left;  . LOW ANTERIOR BOWEL RESECTION  7/08  . [place] SURGERY  2000  . NM MYOVIEW LTD  03/18/2013   Negative for ischemia or infarction. EF 55%.  LOW RISK  . OOPHORECTOMY  1962   right  . squamous cell skin cancer removed    . TRANSTHORACIC ECHOCARDIOGRAM  10/2013, 02/2017   A) EF 55-60%, mild LVH, elevated bili pressures. Mild aortic valve calcification;; B) Normal LV size and function with mild LVH. Unable to assess diastolic function. Normal PA pressures. Mild MR,Mild AI     Current Outpatient Medications  Medication Sig Dispense Refill  . acetaminophen (TYLENOL) 325 MG tablet Take 2 tablets (  650 mg total) by mouth every 6 (six) hours as needed for mild pain (or Fever >/= 101).    . CVS VITAMIN B12 1000 MCG tablet TAKE 1 TABLET BY MOUTH EVERY DAY (Patient taking differently: Take 1,000 mcg by mouth daily. ) 90 tablet 3  . ELIQUIS 2.5 MG TABS tablet TAKE 1 TABLET BY MOUTH TWICE A DAY 180 tablet 1  . febuxostat (ULORIC) 40 MG tablet TAKE 1 TABLET BY MOUTH EVERY OTHER DAY (Patient taking differently: Take 40 mg by mouth 3 (three) times a week. ) 45 tablet 3  . furosemide (LASIX) 40 MG tablet TAKE 0.5 TABS DAILY AS NEEDED.  TAKE 40MG  ONLY IF MORE THAN 2-3 POUNDS OVERNIGHT AND/OR 5 POUNDS/WK (Patient taking differently: Take 20-40 mg by mouth as needed for fluid. ) 30 tablet 6  . hydrALAZINE (APRESOLINE) 25 MG tablet TAKE 1 TAB 2 TIMES DAILY. TAKE THIS 25 MG DOSE ALONG WITH YOUR 50 MG DOSE (75 MG TOTAL) TWICE DAILY. (Patient taking differently: Take 25 mg by mouth 2 (two) times daily. ) 180 tablet 0  . hydrALAZINE (APRESOLINE) 50 MG tablet TAKE 1 TABLET (50 MG TOTAL) BY MOUTH 2 (TWO) TIMES DAILY. TAKE THIS 50 MG DOSE ALONG WITH YOUR 25 MG DOSE (75 MG TOTAL) TWICE DAILY. 180 tablet 3  . HYDROcodone-acetaminophen (NORCO/VICODIN) 5-325 MG tablet Take 1 tablet by mouth every 6 (six) hours as needed for severe pain. 10 tablet 0  . hyoscyamine (LEVSIN SL) 0.125 MG SL tablet TAKE 1-2 TABLETS BY MOUTH EVERY 4 HOURS AS NEEDED (Patient taking differently: Take 0.125-0.25 mg by mouth every 4 (four) hours as needed for cramping. TAKE 1-2 TABLETS BY MOUTH EVERY 4 HOURS AS NEEDED) 120 tablet 11  . levothyroxine (SYNTHROID) 88 MCG tablet TAKE 1 TABLET (88 MCG TOTAL) BY MOUTH DAILY BEFORE BREAKFAST. (Patient taking differently: Take 88 mcg by mouth daily before breakfast. TAKE 1 TABLET (88 MCG TOTAL) BY MOUTH DAILY BEFORE BREAKFAST.) 90 tablet 3  . loperamide (IMODIUM) 2 MG capsule Take 2 mg by mouth as needed for diarrhea or loose stools.    Marland Kitchen LORazepam (ATIVAN) 0.5 MG tablet Take 1 tablet (0.5 mg total) by mouth every 8 (eight) hours as needed. (Patient taking differently: Take 0.5 mg by mouth every 8 (eight) hours as needed for anxiety. ) 90 tablet 5  . MYRBETRIQ 25 MG TB24 tablet Take 25 mg by mouth every other day.     . nitroGLYCERIN (NITROSTAT) 0.4 MG SL tablet Place 1 tablet (0.4 mg total) under the tongue every 5 (five) minutes x 3 doses as needed for chest pain. 25 tablet 3  . omeprazole (PRILOSEC) 40 MG capsule Take 1 capsule (40 mg total) by mouth 2 (two) times daily. NEEDS APPT IN APRIL 60 capsule 11  . Polyethylene Glycol 3350  (MIRALAX PO) Take 17 g by mouth as needed (constipation).     . Potassium Chloride ER 20 MEQ TBCR Take 20 mEq by mouth daily as needed. Take only if you are taking your Lasix that day. 14 tablet 3  . sertraline (ZOLOFT) 50 MG tablet Take 1 tablet (50 mg total) by mouth daily. 30 tablet 2  . temazepam (RESTORIL) 15 MG capsule TAKE 2 CAPSULES AT BEDTIME AS NEEDED FOR SLEEP 60 capsule 5   No current facility-administered medications for this visit.    Allergies:   Penicillins, Xifaxan [rifaximin], Ambien [zolpidem tartrate], Amoxicillin, Ceftin [cefuroxime axetil], Codeine phosphate, Colchicine, Levofloxacin, and Lipitor [atorvastatin]    Social History:  The patient  reports that she has never smoked. She has never used smokeless tobacco. She reports that she does not drink alcohol and does not use drugs.   Family History:  The patient's family history includes Cancer (age of onset: 17) in her mother; Colon cancer in her daughter; Heart attack in her father and son; Heart disease in her brother; Lung cancer in her maternal grandfather; Stroke in her maternal grandmother and paternal grandmother.    ROS: All other systems are reviewed and negative. Unless otherwise mentioned in H&P    PHYSICAL EXAM: VS:  There were no vitals taken for this visit. , BMI There is no height or weight on file to calculate BMI. GEN: Well nourished, well developed, in no acute distress HEENT: normal Neck: no JVD, carotid bruits, or masses Cardiac: ***RRR; no murmurs, rubs, or gallops,no edema  Respiratory:  Clear to auscultation bilaterally, normal work of breathing GI: soft, nontender, nondistended, + BS MS: no deformity or atrophy Skin: warm and dry, no rash Neuro:  Strength and sensation are intact Psych: euthymic mood, full affect   EKG:  EKG {ACTION; IS/IS [id number]} ordered today. The ekg ordered today demonstrates ***   Recent Labs: 08/24/2019: Magnesium 1.4 03/21/2020: Pro B Natriuretic  peptide (BNP) 402.0; TSH 1.10 05/26/2020: BUN 23; Creatinine, Ser 1.38; Hemoglobin 11.8; Platelets 288; Potassium 3.9; Sodium 140 05/27/2020: ALT 11    Lipid Panel    Component Value Date/Time   CHOL 159 11/02/2013 0440   TRIG 111 11/02/2013 0440   HDL 47 11/02/2013 0440   CHOLHDL 3.4 11/02/2013 0440   VLDL 22 11/02/2013 0440   LDLCALC 90 11/02/2013 0440   LDLDIRECT 142.3 09/08/2013 1147      Wt Readings from Last 3 Encounters:  05/16/20 110 lb (49.9 kg)  05/04/20 116 lb (52.6 kg)  03/27/20 128 lb (58.1 kg)      Other studies Reviewed: Additional studies/ records that were reviewed today include: ***. Review of the above records demonstrates: ***   ASSESSMENT AND PLAN:  1.  ***   Current medicines are reviewed at length with the patient today.  I have spent *** dedicated to the care of this patient on the date of this encounter to include pre-visit review of records, assessment, management and diagnostic testing,with shared decision making.  Labs/ tests ordered today include: *** Phill Myron. [place] Pugh, ANP, AACC   05/29/2020 3:19 PM    [facility] [place] [address] Office [phone] Fax [phone]  Notice: This dictation was prepared with Dragon dictation along with smaller phrase technology. Any transcriptional errors that result from this process are unintentional and may not be corrected upon review.

## 2020-05-30 ENCOUNTER — Emergency Department (HOSPITAL_COMMUNITY)
Admission: EM | Admit: 2020-05-30 | Discharge: 2020-05-30 | Disposition: A | Discharge status: Home / Self Care | Payer: Medicare PPO | Attending: Emergency Medicine | Admitting: Emergency Medicine

## 2020-05-30 ENCOUNTER — Encounter (HOSPITAL_COMMUNITY): Payer: Self-pay

## 2020-05-30 ENCOUNTER — Telehealth: Payer: Self-pay | Admitting: Family Medicine

## 2020-05-30 ENCOUNTER — Ambulatory Visit: Payer: Medicare PPO | Admitting: Adult Health

## 2020-05-30 ENCOUNTER — Emergency Department (HOSPITAL_COMMUNITY): Payer: Medicare PPO

## 2020-05-30 DIAGNOSIS — I10 Essential (primary) hypertension: Secondary | ICD-10-CM | POA: Diagnosis not present

## 2020-05-30 DIAGNOSIS — R0902 Hypoxemia: Secondary | ICD-10-CM | POA: Diagnosis not present

## 2020-05-30 DIAGNOSIS — Z5321 Procedure and treatment not carried out due to patient leaving prior to being seen by health care provider: Secondary | ICD-10-CM | POA: Insufficient documentation

## 2020-05-30 DIAGNOSIS — K449 Diaphragmatic hernia without obstruction or gangrene: Secondary | ICD-10-CM | POA: Diagnosis not present

## 2020-05-30 DIAGNOSIS — R0789 Other chest pain: Secondary | ICD-10-CM | POA: Diagnosis not present

## 2020-05-30 DIAGNOSIS — I499 Cardiac arrhythmia, unspecified: Secondary | ICD-10-CM | POA: Diagnosis not present

## 2020-05-30 DIAGNOSIS — I700 Atherosclerosis of aorta: Secondary | ICD-10-CM | POA: Diagnosis not present

## 2020-05-30 DIAGNOSIS — R079 Chest pain, unspecified: Secondary | ICD-10-CM | POA: Diagnosis not present

## 2020-05-30 LAB — BASIC METABOLIC PANEL
Anion gap: 14 (ref 5–15)
BUN: 25 mg/dL — ABNORMAL HIGH (ref 8–23)
CO2: 25 mmol/L (ref 22–32)
Calcium: 8.7 mg/dL — ABNORMAL LOW (ref 8.9–10.3)
Chloride: 97 mmol/L — ABNORMAL LOW (ref 98–111)
Creatinine, Ser: 1.36 mg/dL — ABNORMAL HIGH (ref 0.44–1.00)
GFR calc Af Amer: 39 mL/min — ABNORMAL LOW (ref >60)
GFR calc non Af Amer: 33 mL/min — ABNORMAL LOW (ref >60)
Glucose, Bld: 183 mg/dL — ABNORMAL HIGH (ref 70–99)
Potassium: 3.7 mmol/L (ref 3.5–5.1)
Sodium: 136 mmol/L (ref 135–145)

## 2020-05-30 LAB — CBC
HCT: 35.2 % — ABNORMAL LOW (ref 36.0–46.0)
Hemoglobin: 11.3 g/dL — ABNORMAL LOW (ref 12.0–15.0)
MCH: 31.7 pg (ref 26.0–34.0)
MCHC: 32.1 g/dL (ref 30.0–36.0)
MCV: 98.6 fL (ref 80.0–100.0)
Platelets: 255 10*3/uL (ref 150–400)
RBC: 3.57 MIL/uL — ABNORMAL LOW (ref 3.87–5.11)
RDW: 14.6 % (ref 11.5–15.5)
WBC: 12.8 10*3/uL — ABNORMAL HIGH (ref 4.0–10.5)
nRBC: 0 % (ref 0.0–0.2)

## 2020-05-30 LAB — TROPONIN I (HIGH SENSITIVITY): Troponin I (High Sensitivity): 19 ng/L — ABNORMAL HIGH (ref <18)

## 2020-05-30 MED ORDER — ONDANSETRON HCL 8 MG PO TABS
8.0000 mg | ORAL_TABLET | Freq: Three times a day (TID) | ORAL | 2 refills | Status: DC | PRN
Start: 2020-05-30 — End: 2020-08-07

## 2020-05-30 MED ORDER — SODIUM CHLORIDE 0.9% FLUSH: 3.0000 mL | Freq: Once | INTRAVENOUS | Status: DC

## 2020-05-30 NOTE — Telephone Encounter (Signed)
Patient has been to the hospital 2 times the last four days for nausea and dizziness.  Daughter is requesting generic Zofran.  Pharmacy- [facility] on [address]

## 2020-05-30 NOTE — ED Notes (Signed)
Pt was told the risk of leaving. Pt was encouraged to stay

## 2020-05-30 NOTE — Telephone Encounter (Signed)
Call in Zofran 8 mg every 8 hours prn nausea, #60 with 2 rf

## 2020-05-30 NOTE — Telephone Encounter (Signed)
Rx sent in. Spoke with the patient. She is aware.

## 2020-05-30 NOTE — ED Triage Notes (Signed)
Pt comes via [facility] from home for CP 8/10, radiates to throat, and n/v/d, PTA received one nitro, 324 ASA, 4mg  zofran with some relief

## 2020-06-01 ENCOUNTER — Telehealth: Payer: Self-pay | Admitting: Family Medicine

## 2020-06-01 NOTE — Telephone Encounter (Signed)
Okay for verbal orders? Please advise 

## 2020-06-01 NOTE — Telephone Encounter (Signed)
[name] w/[facility] is calling requesting verbal orders to start palliative care.

## 2020-06-02 NOTE — Telephone Encounter (Signed)
Please okay the orders  

## 2020-06-02 NOTE — Telephone Encounter (Signed)
Verbal orders given to Fran.  

## 2020-06-05 ENCOUNTER — Ambulatory Visit: Payer: Medicare PPO | Admitting: Family Medicine

## 2020-06-19 ENCOUNTER — Ambulatory Visit (INDEPENDENT_AMBULATORY_CARE_PROVIDER_SITE_OTHER): Payer: Medicare PPO | Admitting: *Deleted

## 2020-06-19 DIAGNOSIS — I442 Atrioventricular block, complete: Secondary | ICD-10-CM

## 2020-06-19 LAB — CUP PACEART REMOTE DEVICE CHECK
Battery Remaining Longevity: 64 mo
Battery Voltage: 2.99 V
Brady Statistic AP VP Percent: 0 %
Brady Statistic AP VS Percent: 0 %
Brady Statistic AS VP Percent: 100 %
Brady Statistic AS VS Percent: 0 %
Brady Statistic RA Percent Paced: 0 %
Brady Statistic RV Percent Paced: 99.38 %
Date Time Interrogation Session: 20210807212120
Implantable Lead Implant Date: 20160620
Implantable Lead Implant Date: 20160620
Implantable Lead Location: 753859
Implantable Lead Location: 753860
Implantable Lead Model: 5076
Implantable Lead Model: 5076
Implantable Pulse Generator Implant Date: 20160620
Lead Channel Impedance Value: 285 Ohm
Lead Channel Impedance Value: 342 Ohm
Lead Channel Impedance Value: 342 Ohm
Lead Channel Impedance Value: 437 Ohm
Lead Channel Pacing Threshold Amplitude: 0.75 V
Lead Channel Pacing Threshold Amplitude: 1.125 V
Lead Channel Pacing Threshold Pulse Width: 0.4 ms
Lead Channel Pacing Threshold Pulse Width: 0.4 ms
Lead Channel Sensing Intrinsic Amplitude: 0.375 mV
Lead Channel Sensing Intrinsic Amplitude: 0.75 mV
Lead Channel Sensing Intrinsic Amplitude: 20.75 mV
Lead Channel Sensing Intrinsic Amplitude: 20.75 mV
Lead Channel Setting Pacing Amplitude: 2.25 V
Lead Channel Setting Pacing Pulse Width: 0.4 ms
Lead Channel Setting Sensing Sensitivity: 4 mV

## 2020-06-20 NOTE — Progress Notes (Signed)
Remote pacemaker transmission.   

## 2020-07-01 ENCOUNTER — Other Ambulatory Visit: Payer: Self-pay | Admitting: Cardiology

## 2020-07-04 ENCOUNTER — Other Ambulatory Visit: Payer: Self-pay | Admitting: Family Medicine

## 2020-07-04 NOTE — Telephone Encounter (Signed)
B12 labs have not been done in 3 years. Ok to fill?

## 2020-08-07 ENCOUNTER — Encounter: Payer: Self-pay | Admitting: Family Medicine

## 2020-08-07 ENCOUNTER — Encounter: Payer: Self-pay | Admitting: Cardiology

## 2020-08-07 ENCOUNTER — Other Ambulatory Visit: Payer: Self-pay

## 2020-08-07 ENCOUNTER — Telehealth: Payer: Self-pay | Admitting: Cardiology

## 2020-08-07 ENCOUNTER — Ambulatory Visit: Payer: Medicare PPO | Admitting: Cardiology

## 2020-08-07 VITALS — BP 172/81 | HR 68 | Ht 62.0 in | Wt 115.6 lb

## 2020-08-07 DIAGNOSIS — I10 Essential (primary) hypertension: Secondary | ICD-10-CM | POA: Diagnosis not present

## 2020-08-07 DIAGNOSIS — I4819 Other persistent atrial fibrillation: Secondary | ICD-10-CM | POA: Diagnosis not present

## 2020-08-07 DIAGNOSIS — I130 Hypertensive heart and chronic kidney disease with heart failure and stage 1 through stage 4 chronic kidney disease, or unspecified chronic kidney disease: Secondary | ICD-10-CM

## 2020-08-07 DIAGNOSIS — Z8679 Personal history of other diseases of the circulatory system: Secondary | ICD-10-CM

## 2020-08-07 DIAGNOSIS — R42 Dizziness and giddiness: Secondary | ICD-10-CM

## 2020-08-07 DIAGNOSIS — E440 Moderate protein-calorie malnutrition: Secondary | ICD-10-CM

## 2020-08-07 DIAGNOSIS — Z7901 Long term (current) use of anticoagulants: Secondary | ICD-10-CM

## 2020-08-07 DIAGNOSIS — I5032 Chronic diastolic (congestive) heart failure: Secondary | ICD-10-CM

## 2020-08-07 MED ORDER — APIXABAN 2.5 MG PO TABS: 2.5000 mg | ORAL_TABLET | Freq: Two times a day (BID) | ORAL | 3 refills | Status: DC

## 2020-08-07 NOTE — Patient Instructions (Signed)
Medication Instructions:  Restart Eliquis 2.5 mg twice a day *If you need a refill on your cardiac medications before your next appointment, please call your pharmacy*   Lab Work: None If you have labs (blood work) drawn today and your tests are completely normal, you will receive your results only by: Marland Kitchen MyChart Message (if you have MyChart) OR . A paper copy in the mail If you have any lab test that is abnormal or we need to change your treatment, we will call you to review the results.   Testing/Procedures: None   Follow-Up: At [facility], you and your health needs are our priority.  As part of our continuing mission to provide you with exceptional heart care, we have created designated Provider Care Teams.  These Care Teams include your primary Cardiologist (physician) and Advanced Practice Providers (APPs -  Physician Assistants and Nurse Practitioners) who all work together to provide you with the care you need, when you need it.  We recommend signing up for the patient portal called "MyChart".  Sign up information is provided on this After Visit Summary.  MyChart is used to connect with patients for Virtual Visits (Telemedicine).  Patients are able to view lab/test results, encounter notes, upcoming appointments, etc.  Non-urgent messages can be sent to your provider as well.   To learn more about what you can do with MyChart, go to [email or web address].    Your next appointment:   6 month(s)  The format for your next appointment:   In Person  Provider:   Glenetta Hew, MD   Other Instructions None

## 2020-08-07 NOTE — Telephone Encounter (Signed)
Patient's daughter in law called to inform she sent a mychart message with some office notes for Dr. Ellyn Hack to look at prior to her appointment with him today.

## 2020-08-07 NOTE — Progress Notes (Signed)
Primary Care Provider: [name], MD Cardiologist: Glenetta Hew, MD Electrophysiologist: None  Clinic Note: Chief Complaint  Patient presents with  . Follow-up    Early work in for high blood pressures  . Hypertension    Significant pressures that are somewhat labile.  . Atrial Fibrillation    No symptomatic recurrence   HPI:    [name] is a 86 y.o. female with a PMH notable for longstanding A. fib (with history of tachybradycardia syndrome and complete heart block leading to syncope)--s/p PPM and HFpEF with labile/difficult control hypertension (complicated by orthostatic hypotension) who presents today for 13-month follow-up for hypertension in setting of exacerbated back pain.Marland Kitchen   History of chronic/permanent A. fib no longer on amiodarone  S/p PPM in VVIR mode for SSS/tachybradycardia.  Echo in 2018 showed normal LV function mild LVH and severe LAE.  Myoview in May 2018 low risk.   She also has issues with chronic back pain: Worsening back and shoulder pain over the last couple years  May 2019 at C5/L5-S1 disease.  September 2019 subacromial injection-right shoulder -> had follow-up injections 3 months 6 months then 3 months and 3 months after that.  March 08, 2020-cervical spondylocysts on x-ray-C4-C5 and C5-C6 and C6-C7.  There is recommendation of bilateral L4-L5 and L5-L6 facet injection done on March 08, 2020  April 11, 2020 bilateral L4-L5 and L5-S1 facet injection  May 27, 2020 L4-L5-L5-S1 dexamethasone sodium phosphate injections.  Most recent episode of back pain began in May, she began crying out in pain could not walk or move EMS to emergency room and was found to have thoracolumbar scoliosis and advanced degenerative DJD.  Has been living in the son and daughter-in-law's house since then.  Has been started on multiple different injections and pain medications and still not able to get relief.  In June she was taking pain medications through July.   She was then started on steroids.  Pain was complicated by depression.  By mid to late July she started having episodes of severe depression with refusal to eat and ended up with failure to thrive.  Hospice consulted on July 28 -> was started on steroids.  Following this he began to perk up and eat better if they felt gradually better but still had pain.  Enough though for her to be alert start gaining back some weight.  Her desires are to have no pain at all but not taking pain medications.  Does not want to walk with a walker, wants to drive.  As of October 23 was still on hospice not able to live on her own but wants to live by herself.  Needs full assist.  Pain meds help but does not like taking them.  Depressed.  Not the end-of-life she mention.  As a result of all these activities, this is taking quite a toll on her and she is no longer feeling independent.  Her weight is back up now 126 pounds that is worse was down to 99 pounds. --> No Longer on [place] was last seen on October 28, 2019-where she was under a lot of stress she is dealing with GI issues having to go for esophageal dilation.  The COVID-19 situation was making her very upset.  There is recommendation from Dr. Caryl Comes that she gets a stationary bike peddler and she has done that.  No dizziness or lightheadedness but has noted is that the blood pressures been up some.  Recent  Hospitalizations: None  Reviewed  CV studies:    The following studies were reviewed today: (if available, images/films reviewed: From Epic Chart or Care Everywhere) . None:   Interval History:   [name] returns here today for follow-up earlier than usual because her blood pressures been very high.  She has had a lot going on over the last several months.  Her daughter-in-law sent a letter talking about what been going on for the last several months -> summarized above.  [name] seems to doing stable overall from a cardiac  standpoint.  Her major issue is that she is under quite a bit of pain from her back and shoulders.  She was on hospice for short period time but now apparently is not longer on hospice.  She has been having significantly elevated blood pressures because of the pain.  Interestingly, she has not really noticed any irregular heartbeats or palpitations to suggest recurrence of A. fib (or at least at RVR).  She did have some edema and nephrology started her on a diuretic.  She is now on 40 mg tabs which she is supposed to take 1/2 tablet daily as needed an additional 20 mg for weight gain.  She remains on hydralazine 75 mg twice daily with additional doses as needed.  CV Review of Symptoms (Summary) positive for - edema, irregular heartbeat and Intermittent episodes of what sounds more like musculoskeletal sharp chest pains.  But no exertional chest pain.  She is not really exerting at all anyway.  negative for - orthopnea, paroxysmal nocturnal dyspnea, rapid heart rate, shortness of breath or Syncope/near syncope, TIA/amaurosis fugax.Time she feels lightheaded and dizzy is when she is having significant pain.  The patient does not have symptoms concerning for COVID-19 infection (fever, chills, cough, or new shortness of breath).   REVIEWED OF SYSTEMS   Review of Systems  Constitutional: Positive for malaise/fatigue and weight loss (She had lost quite a bit of weight, has now gained back almost 20+ pounds.).  HENT: Negative for nosebleeds.   Respiratory: Positive for shortness of breath (Only if she overexerts).   Gastrointestinal: Negative for blood in stool and melena.  Genitourinary: Negative for frequency and hematuria.  Musculoskeletal: Positive for back pain and joint pain. Negative for falls.  Neurological: Positive for focal weakness (Legs). Negative for headaches.  Psychiatric/Behavioral: Positive for depression and memory loss (Not not really insomnia just having difficulty sleeping because  of pain.). The patient is nervous/anxious. The patient does not have insomnia.    I have reviewed and (if needed) personally updated the patient's problem list, medications, allergies, past medical and surgical history, social and family history.   PAST MEDICAL HISTORY   Past Medical History:  Diagnosis Date  . Allergy   . Anemia   . Anxiety   . Arthritis   . Asthma   . Benign positional vertigo   . Blood transfusion without reported diagnosis   . Cardiac pacemaker in situ 05/01/2015   for syncope & Sx Bradycardia -> Medtronic MRI compatible pacemaker placed Dr. Caryl Comes   . Cataract   . Chronic diarrhea   . Chronic kidney disease, stage IV (severe) ([place])   . Colon polyps   . Diastolic dysfunction, left ventricle 11/03/2013  . Diverticulosis   . Esophageal stricture   . GERD (gastroesophageal reflux disease)   . Gout   . Heart murmur   . Hemorrhoids   . Hyperlipidemia   . Hypertensive heart disease    no significant  RAS by MRA 2002- (<30% LRAS)   . Hypothyroidism   . IBS (irritable bowel syndrome)   . LBBB (left bundle branch block)- new 11/01/13 11/01/2013  . Paroxysmal atrial fibrillation 11/01/2013   Intermittent through the years and recurrent in December of 2014 treated with amiodarone;; Negative Myoview 10/2013  . Peripheral vascular disease    31-51% LICA, 7-61% RICA by doppler 2009   . Sinus arrest 04/28/2015  . Syncope and collapse 04/28/2015   s/p PPM  . Tubulovillous adenoma 4/07    PAST SURGICAL HISTORY   Past Surgical History:  Procedure Laterality Date  . APPENDECTOMY    . CATARACT EXTRACTION    . EP IMPLANTABLE DEVICE N/A 05/01/2015   Procedure: Pacemaker Implant;  Surgeon: Deboraha Sprang, MD;  Location: [facility];  Service: Cardiovascular;  Laterality: N/A;  . HIP ARTHROPLASTY Left 05/01/2015   Procedure: ARTHROPLASTY BIPOLAR HIP (HEMIARTHROPLASTY);  Surgeon: Rod Can, MD;  Location: [facility];  Service: Orthopedics;  Laterality: Left;  . LOW  ANTERIOR BOWEL RESECTION  7/08  . [place] SURGERY  2000  . NM MYOVIEW LTD  03/18/2013   Negative for ischemia or infarction. EF 55%.  LOW RISK  . OOPHORECTOMY  1962   right  . squamous cell skin cancer removed    . TRANSTHORACIC ECHOCARDIOGRAM  10/2013, 02/2017   A) EF 55-60%, mild LVH, elevated bili pressures. Mild aortic valve calcification;; B) Normal LV size and function with mild LVH. Unable to assess diastolic function. Normal PA pressures. Mild MR,Mild AI    Immunization History  Administered Date(s) Administered  . Influenza Split 09/09/2011, 08/04/2012  . Influenza Whole 08/30/2009, 09/06/2010  . Influenza, High Dose Seasonal PF 08/21/2016, 09/01/2017, 09/01/2018  . Influenza,inj,Quad PF,6+ Mos 08/31/2013, 09/07/2014  . Influenza-Unspecified 09/14/2015  . Pneumococcal Polysaccharide-23 09/01/2008  . Tdap 04/28/2015, 01/11/2017  . Zoster 09/17/2012    MEDICATIONS/ALLERGIES   Current Meds  Medication Sig  . acetaminophen (TYLENOL) 325 MG tablet Take 2 tablets (650 mg total) by mouth every 6 (six) hours as needed for mild pain (or Fever >/= 101).  . buprenorphine (BUTRANS) 5 MCG/HR PTWK Place onto the skin.  Marland Kitchen levothyroxine (SYNTHROID) 88 MCG tablet TAKE 1 TABLET (88 MCG TOTAL) BY MOUTH DAILY BEFORE BREAKFAST. (Patient taking differently: Take 88 mcg by mouth daily before breakfast. TAKE 1 TABLET (88 MCG TOTAL) BY MOUTH DAILY BEFORE BREAKFAST.)  . LORazepam (ATIVAN) 0.5 MG tablet Take 1 tablet (0.5 mg total) by mouth every 8 (eight) hours as needed. (Patient taking differently: Take 0.5 mg by mouth every 8 (eight) hours as needed for anxiety. )  . nitroGLYCERIN (NITROSTAT) 0.4 MG SL tablet Place 1 tablet (0.4 mg total) under the tongue every 5 (five) minutes x 3 doses as needed for chest pain.  Marland Kitchen omeprazole (PRILOSEC) 40 MG capsule Take 1 capsule (40 mg total) by mouth 2 (two) times daily. NEEDS APPT IN APRIL  . senna-docusate (SENOKOT-S) 8.6-50 MG tablet Take by mouth.  .  temazepam (RESTORIL) 15 MG capsule TAKE 2 CAPSULES AT BEDTIME AS NEEDED FOR SLEEP  . [DISCONTINUED] hydrALAZINE (APRESOLINE) 25 MG tablet TAKE 1 TAB 2 TIMES DAILY. TAKE THIS 25 MG DOSE ALONG WITH YOUR 50 MG DOSE (75 MG TOTAL) TWICE DAILY.  . [DISCONTINUED] hyoscyamine (LEVSIN SL) 0.125 MG SL tablet TAKE 1-2 TABLETS BY MOUTH EVERY 4 HOURS AS NEEDED (Patient taking differently: Take 0.125-0.25 mg by mouth every 4 (four) hours as needed for cramping. TAKE 1-2 TABLETS BY MOUTH EVERY  4 HOURS AS NEEDED)    Allergies  Allergen Reactions  . Penicillins Swelling    REACTION: nausea, swelling  . Xifaxan [Rifaximin] Other (See Comments) and Rash    unknown  . Ambien [Zolpidem Tartrate]     " made her crazy "  . Amoxicillin     REACTION: unspecified  . Ceftin [Cefuroxime Axetil] Diarrhea  . Codeine Phosphate     REACTION: unspecified  . Colchicine     Severe diarrhea  . Levofloxacin Other (See Comments)    Tingle numbness  . Lipitor [Atorvastatin] Other (See Comments)    "makes legs jump all night"    SOCIAL HISTORY/FAMILY HISTORY   Reviewed in Epic:  Pertinent findings: Details laid out above reference temporarily on hospice, now no longer on hospice.  She bases having symptoms of failure to thrive, but now seems to be doing better.  Still remains tenuous.  Still probably needs to live with family and is not independent. She is having hard time dealing with this particular issue.  -> Notably limited by her back pain.    Social History   Social History Narrative   She is a widowed mother of 2, one child is deceased. Grandmother of one. She appears to be working out routinely at [employer] using [employer]. She is otherwise quite active.   She does not drink alcohol, does not smoke.  She is retired from [employer].       She lives alone in her condo.  Currently now living with her son and daughter-in-law.  Had recently been on hospice due to failure to thrive  secondary to significant back pain.  Pain significantly improved with the steroids, and she has perked up some with notable weight gain, but still remains very tenuous.  Not likely able to go back to living alone.  Having a difficult time dealing with ramifications of her limited mobility and requirement for significant pain medications.      Very emotionally stressful.  She acknowledges that this is not how she would want to live the rest of her life.    OBJCTIVE -PE, EKG, labs   Wt Readings from Last 3 Encounters:  08/07/20 115 lb 9.6 oz (52.4 kg)  05/16/20 110 lb (49.9 kg)  05/04/20 116 lb (52.6 kg)    Physical Exam: BP (!) 172/81   Pulse 68   Ht 5\' 2"  (1.575 m)   Wt 115 lb 9.6 oz (52.4 kg)   SpO2 96%   BMI 21.14 kg/m  Physical Exam Vitals reviewed.  Constitutional:      General: She is not in acute distress.    Appearance: She is not ill-appearing.     Comments: Somewhat thin and frail, nontoxic appearing.  HENT:     Head: Normocephalic and atraumatic.  Eyes:     Comments: Very irregular shaped pupils following cardiac surgery.  Cardiovascular:     Rate and Rhythm: Normal rate and regular rhythm. Occasional extrasystoles are present.    Chest Wall: PMI is not displaced.     Pulses: Normal pulses.     Heart sounds: S1 normal and S2 normal. Murmur (1/6 harsh, high-pitched C-D SEM at RUSB.) heard.  No friction rub. No gallop.      Comments: Split 2 Pulmonary:     Effort: Pulmonary effort is normal. No respiratory distress.     Breath sounds: Normal breath sounds. No stridor.  Chest:     Chest wall: No tenderness.  Abdominal:     General: Abdomen is flat. Bowel sounds are normal.     Palpations: Abdomen is soft. There is no mass.     Comments: No HSM  Musculoskeletal:        General: Swelling (Trivial) present. Normal range of motion.     Comments: Significant antalgic gait.  But walks with a walker  Neurological:     General: No focal deficit present.     Mental  Status: She is alert and oriented to person, place, and time.  Psychiatric:        Thought Content: Thought content normal.        Judgment: Judgment normal.     Comments: Clearly seems down.  Very uncomfortable.      Adult ECG Report n/a  Recent Labs: No recent labs available Lab Results  Component Value Date   CHOL 159 11/02/2013   HDL 47 11/02/2013   LDLCALC 90 11/02/2013   LDLDIRECT 142.3 09/08/2013   TRIG 111 11/02/2013   CHOLHDL 3.4 11/02/2013   Lab Results  Component Value Date   CREATININE 1.36 (H) 05/30/2020   BUN 25 (H) 05/30/2020   NA 136 05/30/2020   K 3.7 05/30/2020   CL 97 (L) 05/30/2020   CO2 25 05/30/2020   Lab Results  Component Value Date   TSH 1.10 03/21/2020    ASSESSMENT/PLAN    Problem List Items Addressed This Visit    Persistent Paroxysmal atrial fibrillation: CHA2DS2-VASc Score 5, on Eliquis (Chronic)    Appears to be in either sinus rhythm or paced rhythm.  Her underlying rhythm is difficult to tell.  Continues to be on Eliquis with no major bleeding issues.  Not currently on any rate control agent and her resting heart rate is in the 60s      Relevant Medications   apixaban (ELIQUIS) 2.5 MG TABS tablet   Other Relevant Orders   EKG 12-Lead   Hypertensive heart and chronic kidney disease with chronic diastolic congestive heart failure (HCC) (Chronic)    She really is not having heart failure symptoms.  She has some mild edema for which she was started on Lasix.  Her pressures have been somewhat elevated and that could be contributing to this a little bit.  We may need to further titrate hydralazine up to 100 mg twice daily with additional 75 mg as needed. ->  If this is not adequate, we may for short-term have to treat hypertension although this is most likely reactive hypertension to her pain.       Relevant Medications   apixaban (ELIQUIS) 2.5 MG TABS tablet   Systolic hypertension, isolated; difficult to control - Primary (Chronic)      Her blood pressure remains high, antibiotics is a 86 year old pressures in the 170s is not overly worrisome if she is not that symptomatic.  We will gradually titrate up her hydralazine dose to treat her hypertension.  She has pushed up to 100 mg twice daily -> suggest that she can take an additional 75 to 100 mg for third dose of middle of the day for high blood pressure.  If this is inadequate, we may need to consider adding additional medication in conjunction with diuretic.  This would be treating reactive hypertension however.       Relevant Medications   apixaban (ELIQUIS) 2.5 MG TABS tablet   Chronic anticoagulation- Eliquis (Chronic)    On reduced dose of Eliquis given her mild renal insufficiency and age as well as  reduced weight.  No major bleeding issues.      H/O tachycardia-bradycardia syndrome (Chronic)    S/p PPM placement.  Previously was on amiodarone for rhythm suppression.  Decreased due to concern for increasing pacemaker requirement.      Malnutrition of moderate degree    She seems to be putting back on some weight.  Appetite seems to have improved as her back pain is improved.  While she was taking significant pain medications, she was basically in a stupor.  She now seems to be a little bit more alert neurologically, and is eating better.  Has regained some of her strength.  .  Discussed supplementation of her diet with meal replacement shakes/protein shakes, etc.      Orthostatic dizziness    I am concerned about being overly aggressive treating hypertension because she does have a potential for orthostatic hypotension.  Carefully titrate medications to treat reactive hypertension. Also carefully dose diuretic to avoid hypotension from dehydration.          COVID-19 Education: The signs and symptoms of COVID-19 were discussed with the patient and how to seek care for testing (follow up with PCP or arrange E-visit).   The importance of social  distancing and COVID-19 vaccination was discussed today.  The patient is practicing social distancing & Masking.   I spent a total of 58minutes with the patient spent in direct patient consultation.  Additional time spent with chart review  / charting (studies, outside notes, etc): 15 Total Time: 45 min   Current medicines are reviewed at length with the patient today.  (+/- concerns) n/a  Notice: This dictation was prepared with Dragon dictation along with smaller phrase technology. Any transcriptional errors that result from this process are unintentional and may not be corrected upon review.  Patient Instructions / Medication Changes & Studies & Tests Ordered   Patient Instructions  Medication Instructions:  Restart Eliquis 2.5 mg twice a day *If you need a refill on your cardiac medications before your next appointment, please call your pharmacy*   Lab Work: None If you have labs (blood work) drawn today and your tests are completely normal, you will receive your results only by: Marland Kitchen MyChart Message (if you have MyChart) OR . A paper copy in the mail If you have any lab test that is abnormal or we need to change your treatment, we will call you to review the results.   Testing/Procedures: None   Follow-Up: At [facility], you and your health needs are our priority.  As part of our continuing mission to provide you with exceptional heart care, we have created designated Provider Care Teams.  These Care Teams include your primary Cardiologist (physician) and Advanced Practice Providers (APPs -  Physician Assistants and Nurse Practitioners) who all work together to provide you with the care you need, when you need it.  We recommend signing up for the patient portal called "MyChart".  Sign up information is provided on this After Visit Summary.  MyChart is used to connect with patients for Virtual Visits (Telemedicine).  Patients are able to view lab/test results, encounter notes,  upcoming appointments, etc.  Non-urgent messages can be sent to your provider as well.   To learn more about what you can do with MyChart, go to [email or web address].    Your next appointment:   6 month(s)  The format for your next appointment:   In Person  Provider:   Glenetta Hew, MD   Other Instructions None  Studies Ordered:   Orders Placed This Encounter  Procedures  . EKG 12-Lead     Glenetta Hew, M.D., M.S. Interventional Cardiologist   Pager # [phone] Phone # [phone] [address] [place], [place] [address]   Thank you for choosing Heartcare at [facility]!!

## 2020-08-09 ENCOUNTER — Ambulatory Visit: Admitting: Family Medicine

## 2020-08-11 ENCOUNTER — Telehealth: Payer: Self-pay | Admitting: Cardiology

## 2020-08-11 MED ORDER — HYDRALAZINE HCL 100 MG PO TABS: ORAL_TABLET | ORAL | 3 refills | Status: DC

## 2020-08-11 MED ORDER — HYDRALAZINE HCL 25 MG PO TABS: ORAL_TABLET | ORAL | 3 refills | Status: DC

## 2020-08-11 NOTE — Telephone Encounter (Signed)
Per pharmD-increase hydralazine to 100 mg in the AM and 100mg  in the PM, take 75 mg at noon-3pm.      Spoke to patient and daughter-aware and verbalized understanding.   Daughter in law in going to get a new BP cuff (correct size for patient) and continue to monitor.  She will send a message on Mychart with updates on Sunday.

## 2020-08-11 NOTE — Telephone Encounter (Signed)
Returned call to patient-she states she saw Dr. Ellyn Hack on Monday and her BP was elevated.  He told her to continue to monitor this and take additional 25 mg hydralazine if >944 systolic.   She has been taking the additional 25 mg every day since Monday.    She has severe back pain and issues and went to get an injection today and they cancelled it due to her BP being high.    BP readings at appt were 211/81, 248/92, 226/81.    She denies HA, blurred vision, CP, SOB, dizziness.    She is wondering what she needs to do to get her BP down so she can get her injection.  She does not have the readings from this week but knows it was >180 because she has had to take her additional dose daily.  Unable to check BP now, machine broke and daughter getting another one this afternoon.  AM med: hydralazine 75 (50+25) After breakfast: taking additional 25 hydralazine if >180 PM: hydralazine 75 (50+25)   Able to see note in care everywhere: Will elect to delay this procedure given the patient's elevated blood pressure on multiple measurements. She notes that she did not take her BP medication this morning. She was encouraged to call her cardiologist regarding any necessary adjustments of these medications. She was not experiencing headache, vision changes, or other signs of end organ damage concerning for hypertensive urgency/emergency. She will take her blood pressure medication when she returns home.   Unable to recheck BP now after taking medications, will discuss with pharmD to review for recommendations

## 2020-08-11 NOTE — Telephone Encounter (Signed)
Pt c/o BP issue: STAT if pt c/o blurred vision, one-sided weakness or slurred speech  1. What are your last 5 BP readings? 226/81 240/92 211/81  2. Are you having any other symptoms (ex. Dizziness, headache, blurred vision, passed out)? No has some swelling in her feet   3. What is your BP issue? BP running high

## 2020-08-14 ENCOUNTER — Encounter: Payer: Self-pay | Admitting: Cardiology

## 2020-08-14 DIAGNOSIS — Z79891 Long term (current) use of opiate analgesic: Secondary | ICD-10-CM | POA: Diagnosis not present

## 2020-08-14 DIAGNOSIS — G894 Chronic pain syndrome: Secondary | ICD-10-CM | POA: Diagnosis not present

## 2020-08-14 MED ORDER — POTASSIUM CHLORIDE CRYS ER 20 MEQ PO TBCR
20.0000 meq | EXTENDED_RELEASE_TABLET | ORAL | 3 refills | Status: DC | PRN
Start: 2020-08-14 — End: 2020-11-22

## 2020-08-14 MED ORDER — CARVEDILOL 3.125 MG PO TABS: 3.1250 mg | ORAL_TABLET | Freq: Two times a day (BID) | ORAL | 3 refills | Status: DC

## 2020-08-14 NOTE — Assessment & Plan Note (Addendum)
I am concerned about being overly aggressive treating hypertension because she does have a potential for orthostatic hypotension.  Carefully titrate medications to treat reactive hypertension. Also carefully dose diuretic to avoid hypotension from dehydration.

## 2020-08-14 NOTE — Assessment & Plan Note (Signed)
S/p PPM placement.  Previously was on amiodarone for rhythm suppression.  Decreased due to concern for increasing pacemaker requirement.

## 2020-08-14 NOTE — Assessment & Plan Note (Signed)
Appears to be in either sinus rhythm or paced rhythm.  Her underlying rhythm is difficult to tell.  Continues to be on Eliquis with no major bleeding issues.  Not currently on any rate control agent and her resting heart rate is in the 60s

## 2020-08-14 NOTE — Assessment & Plan Note (Addendum)
She seems to be putting back on some weight.  Appetite seems to have improved as her back pain is improved.  While she was taking significant pain medications, she was basically in a stupor.  She now seems to be a little bit more alert neurologically, and is eating better.  Has regained some of her strength.  .  Discussed supplementation of her diet with meal replacement shakes/protein shakes, etc.

## 2020-08-14 NOTE — Assessment & Plan Note (Signed)
She really is not having heart failure symptoms.  She has some mild edema for which she was started on Lasix.  Her pressures have been somewhat elevated and that could be contributing to this a little bit.  We may need to further titrate hydralazine up to 100 mg twice daily with additional 75 mg as needed. ->  If this is not adequate, we may for short-term have to treat hypertension although this is most likely reactive hypertension to her pain.

## 2020-08-14 NOTE — Assessment & Plan Note (Signed)
On reduced dose of Eliquis given her mild renal insufficiency and age as well as reduced weight.  No major bleeding issues.

## 2020-08-14 NOTE — Assessment & Plan Note (Signed)
Her blood pressure remains high, antibiotics is a 86 year old pressures in the 170s is not overly worrisome if she is not that symptomatic.  We will gradually titrate up her hydralazine dose to treat her hypertension.  She has pushed up to 100 mg twice daily -> suggest that she can take an additional 75 to 100 mg for third dose of middle of the day for high blood pressure.  If this is inadequate, we may need to consider adding additional medication in conjunction with diuretic.  This would be treating reactive hypertension however.

## 2020-08-15 ENCOUNTER — Ambulatory Visit: Payer: Medicare PPO | Admitting: Family Medicine

## 2020-08-15 ENCOUNTER — Encounter: Payer: Self-pay | Admitting: Family Medicine

## 2020-08-15 ENCOUNTER — Other Ambulatory Visit: Payer: Self-pay

## 2020-08-15 VITALS — BP 190/80 | HR 70 | Temp 98.2°F | Ht 62.0 in | Wt 107.2 lb

## 2020-08-15 DIAGNOSIS — I10 Essential (primary) hypertension: Secondary | ICD-10-CM

## 2020-08-15 DIAGNOSIS — I5032 Chronic diastolic (congestive) heart failure: Secondary | ICD-10-CM | POA: Diagnosis not present

## 2020-08-15 DIAGNOSIS — I4819 Other persistent atrial fibrillation: Secondary | ICD-10-CM | POA: Diagnosis not present

## 2020-08-15 DIAGNOSIS — Z8679 Personal history of other diseases of the circulatory system: Secondary | ICD-10-CM | POA: Diagnosis not present

## 2020-08-15 DIAGNOSIS — I110 Hypertensive heart disease with heart failure: Secondary | ICD-10-CM | POA: Diagnosis not present

## 2020-08-15 DIAGNOSIS — I130 Hypertensive heart and chronic kidney disease with heart failure and stage 1 through stage 4 chronic kidney disease, or unspecified chronic kidney disease: Secondary | ICD-10-CM

## 2020-08-15 DIAGNOSIS — E440 Moderate protein-calorie malnutrition: Secondary | ICD-10-CM

## 2020-08-15 DIAGNOSIS — I447 Left bundle-branch block, unspecified: Secondary | ICD-10-CM | POA: Diagnosis not present

## 2020-08-15 MED ORDER — AMLODIPINE BESYLATE 2.5 MG PO TABS: 2.5000 mg | ORAL_TABLET | Freq: Every day | ORAL | 3 refills | Status: DC

## 2020-08-15 NOTE — Progress Notes (Signed)
   Subjective:    Patient ID: [name], female    DOB: 1924-04-10, 86 y.o.   MRN: [id number]  HPI Here with her son to follow up on systolic HTN after she was released from Hospice. She is now living with her son and his wife. She misses her independence but she seems top have adjusted well. She still has chronic low back pain, and she recently saw her pain management clinic. They increased the dose of her Butrans patch to 10 mg weekly. She saw Dr. Ellyn Hack on 08-07-20 and he increased her Hydralazine to 100 mg BID. He BP at home has been from 786 to 754 systolic over 49-20 diastolic. No chest pain or SOB. Her appetite remains poor, but her son and his wife have been working hard to ensure [name] gets good nutrition. She has put a little weight back on.    Review of Systems  Constitutional: Positive for fatigue.  Respiratory: Negative.   Cardiovascular: Negative.   Gastrointestinal: Negative.   Genitourinary: Negative.   Musculoskeletal: Positive for arthralgias and back pain.       Objective:   Physical Exam Constitutional:      Comments: Frail, walks with a walker   Cardiovascular:     Rate and Rhythm: Normal rate and regular rhythm.     Pulses: Normal pulses.     Heart sounds: Normal heart sounds.  Pulmonary:     Effort: Pulmonary effort is normal.     Breath sounds: Normal breath sounds.  Neurological:     Mental Status: She is alert.           Assessment & Plan:  Her systolic HTN remains poorly controlled. We will start her back on Amlodipine 2.5 mg daily. They will report back to [place] in 2 weeks. I encouraged her to eat as food as she can.  [name], MD

## 2020-08-31 ENCOUNTER — Other Ambulatory Visit: Payer: Self-pay

## 2020-08-31 ENCOUNTER — Ambulatory Visit (INDEPENDENT_AMBULATORY_CARE_PROVIDER_SITE_OTHER): Payer: Medicare PPO

## 2020-08-31 DIAGNOSIS — Z23 Encounter for immunization: Secondary | ICD-10-CM | POA: Diagnosis not present

## 2020-09-04 ENCOUNTER — Encounter: Payer: Self-pay | Admitting: Family Medicine

## 2020-09-04 MED ORDER — LORAZEPAM 0.5 MG PO TABS: 0.5000 mg | ORAL_TABLET | Freq: Three times a day (TID) | ORAL | 5 refills | Status: DC | PRN

## 2020-09-04 NOTE — Telephone Encounter (Signed)
Done

## 2020-09-06 ENCOUNTER — Telehealth: Payer: Self-pay | Admitting: Cardiology

## 2020-09-06 NOTE — Telephone Encounter (Signed)
Patient's daughter in law calling in regards to [employer]. She states she could not see the response from [place], but states the patient has been dizzy for a week and a half. She states she feels wobbly, but uses a walker so she has not fallen. She states she also has back pain that has extended to her shoulders, but no chest pain. She states they sent her BP and HR readings and her HR has never been this high. She would like a call back to discuss.

## 2020-09-06 NOTE — Telephone Encounter (Signed)
Spoke with patients daughter-in-law [name], ok per [name]  Patient did not get up this am until after 11:00 and took blood pressure before medications  Per [name] patient  is having a lot of pain in back that is needing injection but will not give secondary to elevated blood pressure  Discussed with [name] and will have patient increase Amlodipine to 5 mg daily and be seen next couple of days.  [name] and scheduled appointment with [name] tomorrow  See mychart patient message from 10/4  Lindsey Police, RN To  [name] and Delivered  09/06/2020 11:16 AM  Last Read in MyChart  Not Read  [name], how are you feeling with those BP readings?? Headache, dizziness, chest pain? Are you taking these readings before or after taking your medications?  Ann  Previous Messages Blood Pressure - IMPORTANT!  From  [name] To  Lindsey Man, MD Sent  09/06/2020 11:10 AM  Pressure  10/26: 201/97 HR95 12:15pm, 164/80 HR94 10:30pm 10/27:  207/103 HR92 11:10 am

## 2020-09-07 ENCOUNTER — Encounter: Payer: Self-pay | Admitting: General Practice

## 2020-09-07 ENCOUNTER — Ambulatory Visit (INDEPENDENT_AMBULATORY_CARE_PROVIDER_SITE_OTHER): Payer: Medicare PPO | Admitting: General Practice

## 2020-09-07 ENCOUNTER — Other Ambulatory Visit: Payer: Self-pay

## 2020-09-07 VITALS — BP 162/74 | HR 67 | Ht 62.0 in | Wt 113.6 lb

## 2020-09-07 DIAGNOSIS — I4819 Other persistent atrial fibrillation: Secondary | ICD-10-CM | POA: Diagnosis not present

## 2020-09-07 DIAGNOSIS — Z8679 Personal history of other diseases of the circulatory system: Secondary | ICD-10-CM | POA: Diagnosis not present

## 2020-09-07 DIAGNOSIS — I10 Essential (primary) hypertension: Secondary | ICD-10-CM | POA: Diagnosis not present

## 2020-09-07 NOTE — Progress Notes (Signed)
Cardiology Clinic Note   Patient Name: [name] Date of Encounter: 09/07/2020  Primary Care Provider:  [name], MD Primary Cardiologist:  Glenetta Hew, MD  Patient Profile    [name]. [facility] 86 year old female presents the clinic today for evaluation of her hypertension.  Past Medical History    Past Medical History:  Diagnosis Date   Allergy    Anemia    Anxiety    Arthritis    Asthma    Benign positional vertigo    Blood transfusion without reported diagnosis    Cardiac pacemaker in situ 05/01/2015   for syncope & Sx Bradycardia -> Medtronic MRI compatible pacemaker placed Dr. Caryl Comes    Cataract    Chronic diarrhea    Chronic kidney disease, stage IV (severe) (HCC)    Colon polyps    Diastolic dysfunction, left ventricle 11/03/2013   Diverticulosis    Esophageal stricture    GERD (gastroesophageal reflux disease)    Gout    Heart murmur    Hemorrhoids    Hyperlipidemia    Hypertensive heart disease    no significant RAS by MRA 2002- (<30% LRAS)    Hypothyroidism    IBS (irritable bowel syndrome)    LBBB (left bundle branch block)- new 11/01/13 11/01/2013   Paroxysmal atrial fibrillation 11/01/2013   Intermittent through the years and recurrent in December of 2014 treated with amiodarone;; Negative Myoview 10/2013   Peripheral vascular disease    30-86% LICA, 5-78% RICA by doppler 2009    Sinus arrest 04/28/2015   Syncope and collapse 04/28/2015   s/p PPM   Tubulovillous adenoma 4/07   Past Surgical History:  Procedure Laterality Date   APPENDECTOMY     CATARACT EXTRACTION     EP IMPLANTABLE DEVICE N/A 05/01/2015   Procedure: Pacemaker Implant;  Surgeon: Deboraha Sprang, MD;  Location: [facility];  Service: Cardiovascular;  Laterality: N/A;   HIP ARTHROPLASTY Left 05/01/2015   Procedure: ARTHROPLASTY BIPOLAR HIP (HEMIARTHROPLASTY);  Surgeon: Rod Can, MD;  Location: [facility];  Service: Orthopedics;   Laterality: Left;   LOW ANTERIOR BOWEL RESECTION  7/08   LUMBAR [place] [facility]ERY  2000   NM MYOVIEW LTD  03/18/2013   Negative for ischemia or infarction. EF 55%.  LOW RISK   OOPHORECTOMY  1962   right   squamous cell skin cancer removed     TRANSTHORACIC ECHOCARDIOGRAM  10/2013, 02/2017   A) EF 55-60%, mild LVH, elevated bili pressures. Mild aortic valve calcification;; B) Normal LV size and function with mild LVH. Unable to assess diastolic function. Normal PA pressures. Mild MR,Mild AI    Allergies  Allergies  Allergen Reactions   Penicillins Swelling    REACTION: nausea, swelling   Xifaxan [Rifaximin] Other (See Comments) and Rash    unknown   Ambien [Zolpidem Tartrate]     " made her crazy "   Amoxicillin     REACTION: unspecified   Ceftin [Cefuroxime Axetil] Diarrhea   Codeine Phosphate     REACTION: unspecified   Colchicine     Severe diarrhea   Levofloxacin Other (See Comments)    Tingle numbness   Lipitor [Atorvastatin] Other (See Comments)    "makes legs jump all night"    History of Present Illness    [name] has a PMH of essential hypertension, atrial fibrillation, tachybradycardia syndrome status post PPM, and HFpEF.  She was last seen by Dr. Ellyn Hack on 08/07/2020 for a 53-month follow-up  visit.  Focus of the visit was hypertension in the setting of exacerbated back pain.  It was noted that she had chronic back pain and that it had progressed over the last couple years.  CT 5/19 showed C5/L5-S1 disease.  9/19 subacromial injection right shoulder which was followed by 3 injections 6 months, 3 months, 3 months.  4/21 cervical spondylocysts was found on x-ray C4-C5 and C5-C-C6 and C6-C7.  A bilateral facet injection was performed 4/21.  6/21 bilateral L4-L5 and L5-S1 facet injection.  7/21 L4-L5-L5-S1 dexamethasone sodium phosphate injections.  Her pain was complicated by depression.  By late July she was having episodes of severe depression with refusal of  eating and failure to thrive.  Hospice was contacted 06/07/2020 and she was started on steroid therapy.  After initiating therapy she began to eat better, mood improved, she felt gradually better but still had pain.  Her weight began to return.  Her weight at her last cardiology visit was 126 pounds and she was no longer on hospice.  When she was last seen by Dr. Ellyn Hack she expressed that she was very upset by the COVID-19 situation.  She was using a stationary bike peddler.  She denied dizziness, lightheadedness.  It was noted that her blood pressure was somewhat elevated.  Her hydralazine was changed to 100 mg twice daily with an additional 75 mg as needed.  She contacted nurse triage line 09/06/2020 and indicated that her blood pressure was elevated.  She had awoke at 11 AM and took her blood pressure before taking her medications.  She was having increased back pain and needing a back injection.  The issue was discussed with with [place] [facility] Pharm.D. and patient's amlodipine was increased to 5 mg daily.  She presents the clinic today for follow-up evaluation and states over the past month she has been noticing her blood pressure is elevated in the mornings when she wakes up. After she takes her blood pressure medication pressure comes down into the one fifties over seventies. She reports some dietary indiscretion related to sodium. She does not add table salt to her food. She is also hoping to receive low back injections at [facility]. When she was there last her blood pressure was elevated and she did not receive injection. I will have her maintain her blood pressure log, give her the salty 6 diet sheet, have her follow-up with [facility] pharmacy in 1 month and continue her current medications.  Today she denies chest pain, shortness of breath, increased lower extremity edema, fatigue, palpitations, melena, hematuria, hemoptysis, diaphoresis, weakness, presyncope, syncope, orthopnea, and  PND.   Home Medications    Prior to Admission medications   Medication Sig Start Date End Date Taking? Authorizing Provider  acetaminophen (TYLENOL) 325 MG tablet Take 2 tablets (650 mg total) by mouth every 6 (six) hours as needed for mild pain (or Fever >/= 101). 12/17/17   Debbe Odea, MD  amLODipine (NORVASC) 2.5 MG tablet Take 5 mg by mouth daily.    [provider]  apixaban (ELIQUIS) 2.5 MG TABS tablet Take 1 tablet (2.5 mg total) by mouth 2 (two) times daily. 08/07/20   [name], MD  carvedilol (COREG) 3.125 MG tablet Take 1 tablet (3.125 mg total) by mouth 2 (two) times daily. 08/14/20   [name], MD  hydrALAZINE (APRESOLINE) 100 MG tablet Take 1 tablet (100 mg) in the AM and 1 tablet (100 mg) in the PM. 08/11/20  [name], MD  hydrALAZINE (APRESOLINE) 25 MG tablet Take 75 mg (3 tablets) daily at noon-3pm 08/11/20   [name], MD  hyoscyamine (LEVSIN) 0.125 MG/5ML ELIX Take by mouth.    [provider]  levothyroxine (SYNTHROID) 88 MCG tablet TAKE 1 TABLET (88 MCG TOTAL) BY MOUTH DAILY BEFORE BREAKFAST. Patient taking differently: Take 88 mcg by mouth daily before breakfast. TAKE 1 TABLET (88 MCG TOTAL) BY MOUTH DAILY BEFORE BREAKFAST. 08/24/19   [name], MD  LORazepam (ATIVAN) 0.5 MG tablet Take 1 tablet (0.5 mg total) by mouth every 8 (eight) hours as needed for anxiety. 09/04/20   [name], MD  nitroGLYCERIN (NITROSTAT) 0.4 MG SL tablet Place 1 tablet (0.4 mg total) under the tongue every 5 (five) minutes x 3 doses as needed for chest pain. 01/01/19   [name], MD  omeprazole (PRILOSEC) 40 MG capsule Take 1 capsule (40 mg total) by mouth 2 (two) times daily. NEEDS APPT IN [facility]IL 03/06/20   Ladene Artist, MD  potassium chloride SA (KLOR-CON) 20 MEQ tablet Take 1 tablet (20 mEq total) by mouth as needed (When taking Lasix). 08/14/20   [name], MD  senna-docusate (SENOKOT-S) 8.6-50 MG tablet Take by mouth. 07/06/20    [provider]  temazepam (RESTORIL) 15 MG capsule TAKE 2 CAPSULES AT BEDTIME AS NEEDED FOR SLEEP 05/16/20   [name], MD    Family History    Family History  Problem Relation Age of Onset   Cancer Mother 60       unknown   Heart attack Father    Heart disease Brother    Stroke Maternal Grandmother    Lung cancer Maternal Grandfather    Stroke Paternal Grandmother    Colon cancer Daughter    Heart attack Son    Esophageal cancer Neg Hx    Rectal cancer Neg Hx    Stomach cancer Neg Hx    She indicated that her mother is deceased. She indicated that her father is deceased. She indicated that her brother is deceased. She indicated that her maternal grandmother is deceased. She indicated that her maternal grandfather is deceased. She indicated that her paternal grandmother is deceased. She indicated that her paternal grandfather is deceased. She indicated that her daughter is deceased. She indicated that her son is alive. She indicated that the status of her neg hx is unknown.  Social History    Social History   Socioeconomic History   Marital status: Widowed    Spouse name: Not on file   Number of children: 2   Years of education: Not on file   Highest education level: Not on file  Occupational History   Occupation: retired  Tobacco Use   Smoking status: Never Smoker   Smokeless tobacco: Never Used   Tobacco comment: never used tobacco  Vaping Use   Vaping Use: Never used  Substance and Sexual Activity   Alcohol use: No    Alcohol/week: 0.0 standard drinks   Drug use: No   Sexual activity: Never  Other Topics Concern   Not on file  Social History Narrative   She is a widowed mother of 2, one child is deceased. Grandmother of one. She appears to be working out routinely at [employer] using [employer]. She is otherwise quite active.   She does not drink alcohol, does not smoke.  She is retired from [employer].  She lives alone in her condo.  Currently now living with her son and daughter-in-law.  Had recently been on hospice due to failure to thrive secondary to significant back pain.  Pain significantly improved with the steroids, and she has perked up some with notable weight gain, but still remains very tenuous.  Not likely able to go back to living alone.  Having a difficult time dealing with ramifications of her limited mobility and requirement for significant pain medications.      Very emotionally stressful.  She acknowledges that this is not how she would want to live the rest of her life.   Social Determinants of Health   Financial Resource Strain:    Difficulty of Paying Living Expenses: Not on file  Food Insecurity:    Worried About Charity fundraiser in the Last Year: Not on file   [employer] of Food in the Last Year: Not on file  Transportation Needs:    Lack of Transportation (Medical): Not on file   Lack of Transportation (Non-Medical): Not on file  Physical Activity:    Days of Exercise per Week: Not on file   Minutes of Exercise per Session: Not on file  Stress:    Feeling of Stress : Not on file  Social Connections:    Frequency of Communication with Friends and Family: Not on file   Frequency of Social Gatherings with Friends and Family: Not on file   Attends Religious Services: Not on file   Active Member of Clubs or Organizations: Not on file   Attends Archivist Meetings: Not on file   Marital Status: Not on file  Intimate Partner Violence:    Fear of Current or Ex-Partner: Not on file   Emotionally Abused: Not on file   Physically Abused: Not on file   Sexually Abused: Not on file     Review of Systems    General:  No chills, fever, night sweats or weight changes.  Cardiovascular:  No chest pain, dyspnea on exertion, edema, orthopnea, palpitations, paroxysmal nocturnal dyspnea. Dermatological: No rash, lesions/masses Respiratory:  No cough, dyspnea Urologic: No hematuria, dysuria Abdominal:   No nausea, vomiting, diarrhea, bright red blood per rectum, melena, or hematemesis Neurologic:  No visual changes, wkns, changes in mental status. All other systems reviewed and are otherwise negative except as noted above.  Physical Exam    VS:  BP (!) 162/74    Pulse 67    Ht 5\' 2"  (1.575 m)    Wt 113 lb 9.6 oz (51.5 kg)    SpO2 94%    BMI 20.78 kg/m  , BMI Body mass index is 20.78 kg/m. GEN: Well nourished, well developed, in no acute distress. HEENT: normal. Neck: Supple, no JVD, carotid bruits, or masses. Cardiac: RRR, no murmurs, rubs, or gallops. No clubbing, cyanosis, edema.  Radials/DP/PT 2+ and equal bilaterally.  Respiratory:  Respirations regular and unlabored, clear to auscultation bilaterally. GI: Soft, nontender, nondistended, BS + x 4. MS: no deformity or atrophy. Skin: warm and dry, no rash. Neuro:  Strength and sensation are intact. Psych: Normal affect.  Accessory Clinical Findings    Recent Labs: 03/21/2020: Pro B Natriuretic peptide (BNP) 402.0; TSH 1.10 05/27/2020: ALT 11 05/30/2020: BUN 25; Creatinine, Ser 1.36; Hemoglobin 11.3; Platelets 255; Potassium 3.7; Sodium 136   Recent Lipid Panel    Component Value Date/Time   CHOL 159 11/02/2013 0440   TRIG 111 11/02/2013 0440   HDL 47 11/02/2013 0440  CHOLHDL 3.4 11/02/2013 0440   VLDL 22 11/02/2013 0440   LDLCALC 90 11/02/2013 0440   LDLDIRECT 142.3 09/08/2013 1147    ECG personally reviewed by me today-paced rhythm with PVCs left axis deviation septal infarct undetermined age inferior infarct undetermined age 51 bpm.- No acute changes  EKG 08/14/2020 LBBB V paced 68 bpm  Echocardiogram 03/06/2017 Study Conclusions   - Left ventricle: The cavity size was normal. Wall thickness was  increased in a pattern of mild LVH. Systolic function was normal.  Wall motion was normal; there were no regional wall motion  abnormalities. The study  is not technically sufficient to allow  evaluation of LV diastolic function.  - Aortic valve: Transvalvular velocity was within the normal range.  There was no stenosis. There was mild regurgitation.  - Mitral valve: Transvalvular velocity was within the normal range.  There was no evidence for stenosis. There was mild regurgitation.  - Left atrium: The atrium was severely dilated.  - Right ventricle: The cavity size was normal. Wall thickness was  normal. Systolic function was normal.  - Tricuspid valve: There was mild regurgitation.  - Pulmonary arteries: Systolic pressure was within the normal  range. PA peak pressure: 30 mm Hg (S).  Assessment & Plan   1.  Essential hypertension-BP today 162/74.  Contacted nurse triage line on 09/06/2020.  Patient discuss pain issues and increased blood pressure.  At that time her amlodipine was increased to 5 mg daily.  During prior visit with Dr. Ellyn Hack her hydralazine was increased to 100 mg twice daily with additional 75 mg as needed.  She is continuing with her chronic back pain which is driving up her blood pressure.  She remains somewhat labile with her blood pressure by report. Continue amlodipine, hydralazine, carvedilol Heart healthy low-sodium diet-salty 6 given Increase physical activity as tolerated Maintain blood pressure log.-Instructed to contact office with elevated blood pressures Follow-up with hypertension clinic pharmacy 1 month.  Persistent paroxysmal atrial fibrillation-CHA2DS2-VASc score 5.  Previous EKG showed paced rhythm with LBBB.  No reported bleeding issues. Continue Eliquis, carvedilol, Heart healthy low-sodium diet-salty 6 given Increase physical activity as tolerated  History of tachybradycardia syndrome-underwent PPM 05/01/2015. Followed by EP  Disposition: Follow-up hypertension clinic pharmacy in 1 month  [name] M. Karne Ozga NP-C    09/07/2020, 3:25 PM [place] [address] Office [phone] Fax [phone]  Notice: This dictation was prepared with Dragon dictation along with smaller phrase technology. Any transcriptional errors that result from this process are unintentional and may not be corrected upon review.

## 2020-09-07 NOTE — Patient Instructions (Addendum)
Medication Instructions:  CONTINUE AMLODIPINE  TAKE TYLENOL 650MG  EVERY 6 HOURS NO MORE THAN 3,000MG  (4 DOSES)DAILY *If you need a refill on your cardiac medications before your next appointment, please call your pharmacy*  Lab Work:   Testing/Procedures:  NONE    NONE  Special Instructions TAKE AND LOG YOUR BLOOD PRESSURE DAILY-AT LEAST 1 HOUR AFTER TAKING YOUR MEDICATION.   BRING LOG WITH YOU TO FOLLOW UP APPOINTMENT IN 1 MONTH.  PLEASE READ AND FOLLOW SALTY 6-ATTACHED  Follow-Up: Your next appointment:  1 month(s) In Person with [facility], you and your health needs are our priority.  As part of our continuing mission to provide you with exceptional heart care, we have created designated Provider Care Teams.  These Care Teams include your primary Cardiologist (physician) and Advanced Practice Providers (APPs -  Physician Assistants and Nurse Practitioners) who all work together to provide you with the care you need, when you need it.

## 2020-09-11 DIAGNOSIS — Z79899 Other long term (current) drug therapy: Secondary | ICD-10-CM | POA: Diagnosis not present

## 2020-09-11 DIAGNOSIS — G894 Chronic pain syndrome: Secondary | ICD-10-CM | POA: Diagnosis not present

## 2020-09-11 DIAGNOSIS — M533 Sacrococcygeal disorders, not elsewhere classified: Secondary | ICD-10-CM | POA: Diagnosis not present

## 2020-09-14 ENCOUNTER — Other Ambulatory Visit (INDEPENDENT_AMBULATORY_CARE_PROVIDER_SITE_OTHER): Payer: Medicare PPO

## 2020-09-14 DIAGNOSIS — I10 Essential (primary) hypertension: Secondary | ICD-10-CM | POA: Diagnosis not present

## 2020-09-14 NOTE — Addendum Note (Signed)
Addended by: Orma Render on: 09/14/2020 01:24 PM   Modules accepted: Orders

## 2020-09-18 ENCOUNTER — Ambulatory Visit (INDEPENDENT_AMBULATORY_CARE_PROVIDER_SITE_OTHER): Payer: Medicare PPO

## 2020-09-18 DIAGNOSIS — I442 Atrioventricular block, complete: Secondary | ICD-10-CM

## 2020-09-18 LAB — CUP PACEART REMOTE DEVICE CHECK
Battery Remaining Longevity: 56 mo
Battery Voltage: 2.99 V
Brady Statistic AP VP Percent: 0 %
Brady Statistic AP VS Percent: 0 %
Brady Statistic AS VP Percent: 100 %
Brady Statistic AS VS Percent: 0 %
Brady Statistic RA Percent Paced: 0 %
Brady Statistic RV Percent Paced: 95.56 %
Date Time Interrogation Session: 20211107195244
Implantable Lead Implant Date: 20160620
Implantable Lead Implant Date: 20160620
Implantable Lead Location: 753859
Implantable Lead Location: 753860
Implantable Lead Model: 5076
Implantable Lead Model: 5076
Implantable Pulse Generator Implant Date: 20160620
Lead Channel Impedance Value: 285 Ohm
Lead Channel Impedance Value: 304 Ohm
Lead Channel Impedance Value: 342 Ohm
Lead Channel Impedance Value: 380 Ohm
Lead Channel Pacing Threshold Amplitude: 0.75 V
Lead Channel Pacing Threshold Amplitude: 0.875 V
Lead Channel Pacing Threshold Pulse Width: 0.4 ms
Lead Channel Pacing Threshold Pulse Width: 0.4 ms
Lead Channel Sensing Intrinsic Amplitude: 0.375 mV
Lead Channel Sensing Intrinsic Amplitude: 0.75 mV
Lead Channel Sensing Intrinsic Amplitude: 6.375 mV
Lead Channel Sensing Intrinsic Amplitude: 6.375 mV
Lead Channel Setting Pacing Amplitude: 2 V
Lead Channel Setting Pacing Pulse Width: 0.4 ms
Lead Channel Setting Sensing Sensitivity: 4 mV

## 2020-09-19 NOTE — Progress Notes (Signed)
Remote pacemaker transmission.   

## 2020-09-20 ENCOUNTER — Telehealth: Payer: Self-pay | Admitting: Cardiology

## 2020-09-20 NOTE — Telephone Encounter (Signed)
Spoke to patient's daughter Butch Penny.She stated mother has not been feeling well for the past 6 months.Stated she is getting worse.Stated she just don't feel good,no energy.She is concerned pulse 85 to 100.B/P ranging 164/81,151/76,186/84,162/70,182/85,146/64,140/61,182/78.Daughter reassured pulse is ok. Stated mother would like to see Dr.Harding.Appointment scheduled with Dr.Harding 11/12 at 3:20 pm.Advised to bring all medications and a list of B/P and pulse readings.

## 2020-09-20 NOTE — Telephone Encounter (Signed)
I understand that they want to see me, but this is a same issue that we discussed last time I saw her.  I am comfortable with her blood pressures, and heart rates.  She has labile blood pressure and we have given them instructions on what to do.  No matter what changes we make in the clinic, she is, have labile pressures and is not feeling well.  They took her off of hospice.     I guess we will discuss it with her again.  Patient is never a 20-minute patient.  Glenetta Hew, MD

## 2020-09-20 NOTE — Telephone Encounter (Signed)
    Butch Penny calling, she said pt doesn't feel good for a couple of days. She said pt don't have appetite and sleep all the time and also have swollen legs. She denied sob and cp but she said pt's HR been up to 80-100 at rest. She would like to speak with a nurse to discuss

## 2020-09-22 ENCOUNTER — Encounter: Payer: Self-pay | Admitting: Cardiology

## 2020-09-22 ENCOUNTER — Ambulatory Visit: Payer: Medicare PPO | Admitting: Cardiology

## 2020-09-22 VITALS — BP 158/72 | HR 65 | Ht 62.0 in | Wt 114.2 lb

## 2020-09-22 DIAGNOSIS — I10 Essential (primary) hypertension: Secondary | ICD-10-CM

## 2020-09-22 DIAGNOSIS — I4819 Other persistent atrial fibrillation: Secondary | ICD-10-CM

## 2020-09-22 DIAGNOSIS — E440 Moderate protein-calorie malnutrition: Secondary | ICD-10-CM | POA: Diagnosis not present

## 2020-09-22 DIAGNOSIS — I5032 Chronic diastolic (congestive) heart failure: Secondary | ICD-10-CM

## 2020-09-22 DIAGNOSIS — M8949 Other hypertrophic osteoarthropathy, multiple sites: Secondary | ICD-10-CM

## 2020-09-22 DIAGNOSIS — Z950 Presence of cardiac pacemaker: Secondary | ICD-10-CM

## 2020-09-22 DIAGNOSIS — R42 Dizziness and giddiness: Secondary | ICD-10-CM

## 2020-09-22 DIAGNOSIS — F418 Other specified anxiety disorders: Secondary | ICD-10-CM

## 2020-09-22 DIAGNOSIS — N182 Chronic kidney disease, stage 2 (mild): Secondary | ICD-10-CM

## 2020-09-22 DIAGNOSIS — E876 Hypokalemia: Secondary | ICD-10-CM

## 2020-09-22 DIAGNOSIS — I130 Hypertensive heart and chronic kidney disease with heart failure and stage 1 through stage 4 chronic kidney disease, or unspecified chronic kidney disease: Secondary | ICD-10-CM | POA: Diagnosis not present

## 2020-09-22 DIAGNOSIS — M159 Polyosteoarthritis, unspecified: Secondary | ICD-10-CM

## 2020-09-22 MED ORDER — CARVEDILOL 6.25 MG PO TABS: 6.2500 mg | ORAL_TABLET | Freq: Two times a day (BID) | ORAL | 3 refills | Status: DC

## 2020-09-22 NOTE — Patient Instructions (Addendum)
Medication Instructions:   increase  Carvedilol 6.25 mg twice a day   Take lasix at least 3 days -       *If you need a refill on your cardiac medications before your next appointment, please call your pharmacy*   Lab Work: cmp -  Next week  If you have labs (blood work) drawn today and your tests are completely normal, you will receive your results only by: Marland Kitchen MyChart Message (if you have MyChart) OR . A paper copy in the mail If you have any lab test that is abnormal or we need to change your treatment, we will call you to review the results.   Testing/Procedures: Not needed   Follow-Up: At [facility], you and your health needs are our priority.  As part of our continuing mission to provide you with exceptional heart care, we have created designated Provider Care Teams.  These Care Teams include your primary Cardiologist (physician) and Advanced Practice Providers (APPs -  Physician Assistants and Nurse Practitioners) who all work together to provide you with the care you need, when you need it.     Your next appointment:    keep appointment for March 2022  The format for your next appointment:   In Person  Provider:   Glenetta Hew, MD   Other Instructions   wear  Ace wraps on legs for swelling and remove at night  Until you can use your support socks/hose  elevate feet while seating   Eat anything you like except highly salty foods

## 2020-09-22 NOTE — Progress Notes (Signed)
Primary Care Provider: [name], MD Cardiologist: Glenetta Hew, MD Electrophysiologist: None  Clinic Note: Chief Complaint  Patient presents with  . Follow-up    Per family request  . Hypertension    Difficulty control   HPI:    [name] is a 86 y.o. female with a PMH notable for longstanding A. fib (with history of TACHYBRADYCARDIA SYNDROME and COMPLETE HEART BLOCK leading to SYNCOPE)--s/p PPM and HFpEF with LABILE/DIFFICULT CONTROL HYPERTENSION (complicated by ORTHOSTATIC HYPOTENSION) who presents today for 2 -month follow-up for labile blood pressure.   History of chronic/permanent A. fib no longer on amiodarone  S/p PPM in VVIR mode for SSS/tachybradycardia.  Echo in 2018 showed normal LV function mild LVH and severe LAE.  Myoview in May 2018 low risk.   She also has issues with chronic back pain: Worsening back and shoulder pain over the last couple years  May 2019 at C5/L5-S1 disease.  September 2019 subacromial injection-right shoulder -> had follow-up injections 3 months 6 months then 3 months and 3 months after that.  March 08, 2020-cervical spondylocysts on x-ray-C4-C5 and C5-C6 and C6-C7.  There is recommendation of bilateral L4-L5 and L5-L6 facet injection done on March 08, 2020  April 11, 2020 bilateral L4-L5 and L5-S1 facet injection  May 27, 2020 L4-L5-L5-S1 dexamethasone sodium phosphate injections.  May 2021: Thoracolumbar scoliosis advanced DJD -> severe episode of pain, was crying out in pain and could not walk or move.  EMS contacted.  .  For short-term she was living at her daughter's house, but after most recent discharge from skilled nursing facility, he insisted on staying at home.    Pain complicated by depression  By mid to late July she started having episodes of severe depression with refusal to eat and ended up with failure to thrive.  Complicated by the Covid 19 restrictions as well.  Hospice consulted on July 28 -> was  started on steroids.  Following this he began to perk up and eat better if they felt gradually better but still had pain.  Enough though for her to be alert start gaining back some weight.  Her desires are to have no pain at all but not taking pain medications.  Does not want to walk with a walker, wants to drive.  As of October 23 was still on hospice not able to live on her own but wants to live by herself.  Needs full assist.  Pain meds help but does not like taking them.  Depressed.  Not the end-of-life she mention.  As a result of all these activities, this is taking quite a toll on her and she is no longer feeling independent.  Her weight is back up now 126 pounds that is worse was down to 99 pounds. --> No Longer on [place] was last seen on by me on September 27 -> Long from note of visit.  At this point she had come off of hospice and was having quite labile blood pressures.  He was started on diuretic by nephrology for edema (although they were systolic blood pressure).  She was having chest pains.  Dyspnea exertion with overexertion.  Back and joint pain, leg weakness along with depression/anxiety and memory issues.  I increase hydralazine 75 mg 3 times daily with as needed dosing for systolic pressures greater than 170  Several telephone calls were made indicating high blood pressures, she was brought back in on October 28, and she  saw Coletta Memos, NP --> blood pressures noted to be elevated when she wakes up in the morning.  Back injections were being withheld because of elevated blood pressures.  Unfortunately the back pain is probably what is driving the blood pressure.  Recommend maintaining a blood pressure log and returning to CVRR BP clinic in 1 month.  Recent Hospitalizations: None  Reviewed  CV studies:    The following studies were reviewed today: (if available, images/films reviewed: From Epic Chart or Care Everywhere) . None:   Interval History:    [name] returns here today follow-up with me as opposed to CV[facility].  Her daughter [name] contacted the office indicating that she is "not been feeling well for 6 months "and getting worse.  Does not feel well and has no energy.  Concerned about pulse 85-100 bpm, and BP ranging from 140/61 up to 186/84 mmHg with readings as follows: 164/81,151/76,186/84,162/70,182/85,146/64,140/61,182/78.  For [name], 1 major issues is that she is extremely depressed, and is truly not capable of living alone and caring for herself.  She does require assistance.  Unfortunately she does not want to live in assisted living or with her daughter.  She is extremely debilitated with her back and sometimes has been unable to even move.  She does still have labile blood pressures , and for the most part denies any headache or blurred vision, dizziness, TIA or amaurosis fugax symptoms.  Heart rates between 85-100 are relatively normal and she has not felt any tachycardia spells.  She is also now noting some edema.  Concerning symptoms include confusion, poor sleep, pain, leg edema and fatigue.  Not noticing any rapid or irregular heartbeats or palpitations.  No chest pain or pressure with rest or exertion.  She has sharp pinching chest pains off and on but they are not exertional.  Of her symptoms are noncardiac in nature.  CV Review of Symptoms (Summary) positive for - edema and Sharp MSK type chest pain, sensation of fast heart rate although not recorded; lightheadedness and dizziness associated with significant back pain, and occasionally with standing up fast negative for - irregular heartbeat, orthopnea, paroxysmal nocturnal dyspnea, shortness of breath or Syncope/near syncope, TIA/amaurosis fugax.  The patient does not have symptoms concerning for COVID-19 infection (fever, chills, cough, or new shortness of breath).   REVIEWED OF SYSTEMS   Review of Systems  Constitutional: Positive for malaise/fatigue. Negative  for weight loss (Is now up 7 more pounds, seemingly more healthy.  Eating a little better.).  HENT: Negative for nosebleeds.   Respiratory: Positive for shortness of breath (With overexertion, mostly because of pain).   Cardiovascular: Positive for leg swelling.  Gastrointestinal: Negative for blood in stool, melena, nausea and vomiting.  Genitourinary: Negative for frequency and hematuria.  Musculoskeletal: Positive for back pain and joint pain. Negative for falls.  Neurological: Positive for dizziness and focal weakness (Legs). Negative for tingling, weakness and headaches.  Psychiatric/Behavioral: Positive for depression and memory loss (Not not really insomnia just having difficulty sleeping because of pain.). The patient is nervous/anxious and has insomnia.    I have reviewed and (if needed) personally updated the patient's problem list, medications, allergies, past medical and surgical history, social and family history.   PAST MEDICAL HISTORY   Past Medical History:  Diagnosis Date  . Allergy   . Anemia   . Anxiety   . Arthritis   . Asthma   . Benign positional vertigo   . Blood transfusion without reported diagnosis   .  Cardiac pacemaker in situ 05/01/2015   for syncope & Sx Bradycardia -> Medtronic MRI compatible pacemaker placed Dr. Caryl Comes   . Cataract   . Chronic diarrhea   . Chronic kidney disease, stage IV (severe) ([place])   . Colon polyps   . Diastolic dysfunction, left ventricle 11/03/2013  . Diverticulosis   . Esophageal stricture   . GERD (gastroesophageal reflux disease)   . Gout   . Heart murmur   . Hemorrhoids   . Hyperlipidemia   . Hypertensive heart disease    no significant RAS by MRA 2002- (<30% LRAS)   . Hypothyroidism   . IBS (irritable bowel syndrome)   . LBBB (left bundle branch block)- new 11/01/13 11/01/2013  . Paroxysmal atrial fibrillation 11/01/2013   Intermittent through the years and recurrent in December of 2014 treated with amiodarone;;  Negative Myoview 10/2013  . Peripheral vascular disease    63-81% LICA, 7-71% RICA by doppler 2009   . Sinus arrest 04/28/2015  . Syncope and collapse 04/28/2015   s/p PPM  . Tubulovillous adenoma 4/07    PAST SURGICAL HISTORY   Past Surgical History:  Procedure Laterality Date  . APPENDECTOMY    . CATARACT EXTRACTION    . EP IMPLANTABLE DEVICE N/A 05/01/2015   Procedure: Pacemaker Implant;  Surgeon: Deboraha Sprang, MD;  Location: [facility];  Service: Cardiovascular;  Laterality: N/A;  . HIP ARTHROPLASTY Left 05/01/2015   Procedure: ARTHROPLASTY BIPOLAR HIP (HEMIARTHROPLASTY);  Surgeon: Rod Can, MD;  Location: [facility];  Service: Orthopedics;  Laterality: Left;  . LOW ANTERIOR BOWEL RESECTION  7/08  . [place] SURGERY  2000  . NM MYOVIEW LTD  03/18/2013   Negative for ischemia or infarction. EF 55%.  LOW RISK  . OOPHORECTOMY  1962   right  . squamous cell skin cancer removed    . TRANSTHORACIC ECHOCARDIOGRAM  10/2013, 02/2017   A) EF 55-60%, mild LVH, elevated bili pressures. Mild aortic valve calcification;; B) Normal LV size and function with mild LVH. Unable to assess diastolic function. Normal PA pressures. Mild MR,Mild AI    Immunization History  Administered Date(s) Administered  . Fluad Quad(high Dose 65+) 08/31/2020  . Influenza Split 09/09/2011, 08/04/2012  . Influenza Whole 08/30/2009, 09/06/2010  . Influenza, High Dose Seasonal PF 08/21/2016, 09/01/2017, 09/01/2018  . Influenza,inj,Quad PF,6+ Mos 08/31/2013, 09/07/2014  . Influenza-Unspecified 09/14/2015  . Pneumococcal Polysaccharide-23 09/01/2008  . Tdap 04/28/2015, 01/11/2017  . Zoster 09/17/2012    MEDICATIONS/ALLERGIES   Current Meds  Medication Sig  . acetaminophen (TYLENOL) 325 MG tablet Take 2 tablets (650 mg total) by mouth every 6 (six) hours as needed for mild pain (or Fever >/= 101).  Marland Kitchen amLODipine (NORVASC) 2.5 MG tablet Take 5 mg by mouth daily.  Marland Kitchen apixaban (ELIQUIS) 2.5 MG TABS  tablet Take 1 tablet (2.5 mg total) by mouth 2 (two) times daily.  . buprenorphine (BUTRANS) 10 MCG/HR PTWK Place onto the skin.  . furosemide (LASIX) 20 MG tablet   . hydrALAZINE (APRESOLINE) 100 MG tablet Take 1 tablet (100 mg) in the AM and 1 tablet (100 mg) in the PM.  . hydrALAZINE (APRESOLINE) 25 MG tablet Take 75 mg (3 tablets) daily at noon-3pm  . hyoscyamine (LEVSIN) 0.125 MG/5ML ELIX Take by mouth.  . levothyroxine (SYNTHROID) 88 MCG tablet TAKE 1 TABLET (88 MCG TOTAL) BY MOUTH DAILY BEFORE BREAKFAST.  Marland Kitchen LORazepam (ATIVAN) 0.5 MG tablet Take 1 tablet (0.5 mg total) by mouth every 8 (eight) hours  as needed for anxiety.  . mirabegron ER (MYRBETRIQ) 25 MG TB24 tablet Take 1 tablet by mouth daily.  . nitroGLYCERIN (NITROSTAT) 0.4 MG SL tablet Place 1 tablet (0.4 mg total) under the tongue every 5 (five) minutes x 3 doses as needed for chest pain.  Marland Kitchen omeprazole (PRILOSEC) 40 MG capsule Take 1 capsule (40 mg total) by mouth 2 (two) times daily. NEEDS APPT IN APRIL  . potassium chloride SA (KLOR-CON) 20 MEQ tablet Take 1 tablet (20 mEq total) by mouth as needed (When taking Lasix).  Marland Kitchen senna-docusate (SENOKOT-S) 8.6-50 MG tablet Take by mouth.  . temazepam (RESTORIL) 15 MG capsule TAKE 2 CAPSULES AT BEDTIME AS NEEDED FOR SLEEP  . [DISCONTINUED] carvedilol (COREG) 3.125 MG tablet Take 1 tablet (3.125 mg total) by mouth 2 (two) times daily.    Allergies  Allergen Reactions  . Penicillins Swelling    REACTION: nausea, swelling  . Xifaxan [Rifaximin] Other (See Comments) and Rash    unknown  . Ambien [Zolpidem Tartrate]     " made her crazy "  . Amoxicillin     REACTION: unspecified  . Ceftin [Cefuroxime Axetil] Diarrhea  . Codeine Phosphate     REACTION: unspecified  . Colchicine     Severe diarrhea  . Levofloxacin Other (See Comments)    Tingle numbness  . Lipitor [Atorvastatin] Other (See Comments)    "makes legs jump all night"    SOCIAL HISTORY/FAMILY HISTORY   Reviewed in  Epic:  Pertinent findings: Details laid out above reference temporarily on hospice, now no longer on hospice.  She bases having symptoms of failure to thrive, but now seems to be doing better.  Still remains tenuous.  Still probably needs to live with family and is not independent. She is having hard time dealing with this particular issue.  -> Notably limited by her back pain.    Social History   Social History Narrative   She is a widowed mother of 2, one child is deceased. Grandmother of one. She appears to be working out routinely at [employer] using [employer]. She is otherwise quite active.   She does not drink alcohol, does not smoke.  She is retired from [employer].       She lives alone in her condo.  Currently now living with her son and daughter-in-law.  Had recently been on hospice due to failure to thrive secondary to significant back pain.  Pain significantly improved with the steroids, and she has perked up some with notable weight gain, but still remains very tenuous.  Not likely able to go back to living alone.  Having a difficult time dealing with ramifications of her limited mobility and requirement for significant pain medications.      Very emotionally stressful.  She acknowledges that this is not how she would want to live the rest of her life.    OBJCTIVE -PE, EKG, labs   Wt Readings from Last 3 Encounters:  09/22/20 114 lb 3.2 oz (51.8 kg)  09/07/20 113 lb 9.6 oz (51.5 kg)  08/15/20 107 lb 3.2 oz (48.6 kg)    Physical Exam: BP (!) 158/72   Pulse 65   Ht 5\' 2"  (1.575 m)   Wt 114 lb 3.2 oz (51.8 kg)   BMI 20.89 kg/m  Physical Exam Vitals reviewed.  Constitutional:      General: She is not in acute distress.    Appearance: She is not ill-appearing.  Comments: Somewhat thin and frail, nontoxic appearing.  HENT:     Head: Normocephalic and atraumatic.  Eyes:     Comments: Very irregular shaped pupils following cardiac surgery.    Neck:     Vascular: No carotid bruit, hepatojugular reflux or JVD.  Cardiovascular:     Rate and Rhythm: Normal rate and regular rhythm. Occasional extrasystoles are present.    Chest Wall: PMI is not displaced.     Pulses: Normal pulses.     Heart sounds: S1 normal and S2 normal. Murmur (1/6 harsh, high-pitched C-D SEM at RUSB.) heard.  No friction rub. No gallop.      Comments: Split 2 Pulmonary:     Effort: Pulmonary effort is normal. No respiratory distress.     Breath sounds: Normal breath sounds. No stridor.  Chest:     Chest wall: No tenderness.  Abdominal:     Comments: No HSM  Musculoskeletal:        General: Swelling (1+ bilateral LE to ankles) present. Normal range of motion.     Cervical back: Normal range of motion and neck supple.     Comments: Significant antalgic gait.  But walks with a walker  Skin:    General: Skin is warm and dry.  Neurological:     General: No focal deficit present.     Mental Status: She is alert and oriented to person, place, and time.     Gait: Gait abnormal (Somewhat slow, antalgic gait).  Psychiatric:        Thought Content: Thought content normal.        Judgment: Judgment normal.     Comments: Depressed, sad, somewhat tearful and anxious      Adult ECG Report V- paced, rate 65 bpm, PVCs.  Recent Labs: No recent labs available  Lab Results  Component Value Date   CREATININE 1.66 (H) 10/02/2020   BUN 38 (H) 10/02/2020   NA 141 10/02/2020   K 4.6 10/02/2020   CL 101 10/02/2020   CO2 24 10/02/2020   Lab Results  Component Value Date   TSH 1.10 03/21/2020    ASSESSMENT/PLAN    Problem List Items Addressed This Visit    Persistent Paroxysmal atrial fibrillation: CHA2DS2-VASc Score 5, on Eliquis (Chronic)    Her heart rate here in the 60s, elevated between 85 to 100 bpm.  I am perfectly fine with these heart rates, but because he is concerned about heart rate and palpitations, we will increase carvedilol to 6.25 mg twice  daily.  Currently paced with some PVCs.. She is on reduced dose apixaban      Relevant Medications   furosemide (LASIX) 20 MG tablet   carvedilol (COREG) 6.25 MG tablet   Other Relevant Orders   EKG 12-Lead (Completed)   Comprehensive metabolic panel (Completed)   Hypertensive heart and chronic kidney disease with chronic diastolic congestive heart failure (HCC) (Chronic)    Thankfully, despite having significant hypertension, she is not having true CHF symptoms.  Edema is probably venous stasis related and she is now on Lasix which I recommended she take at least 3 days a week.  With edema I recommend that she use support stockings as well.  We are increasing carvedilol dose to 6.25 mg twice daily and allow for additional 100 mg of hydralazine as needed for elevated systolic pressures greater than 160 mgHg      Relevant Medications   furosemide (LASIX) 20 MG tablet   carvedilol (COREG) 6.25 MG tablet  Other Relevant Orders   EKG 12-Lead (Completed)   Comprehensive metabolic panel (Completed)   Systolic hypertension, isolated; difficult to control - Primary (Chronic)    She still has pretty significant blood pressures, but has had some orthostatic symptoms as well.  She is currently taking hydralazine 3 times daily but with 100 mg in the morning and p.m. and 75 in the midday.  Indicated that she can take an additional 75 to 1 mg for isolated systolic BP readings that are over 160 mmHg  Plan:  We will increase carvedilol to 6.25 mg twice daily  Lasix at least 3 days a week and PRN for additional days  Use as needed 100 mg hydralazine as noted      Relevant Medications   furosemide (LASIX) 20 MG tablet   carvedilol (COREG) 6.25 MG tablet   Osteoarthritis (Chronic)    She has chronic pain from arthritis which is really the most limiting feature for her.  This is probably driving her blood pressure to be high.  I really think with her level of disability that she would be  best served either in an assisted living or living with her daughter.  She is really reluctant to do this because she does not want to get up.  Dependence, I explained to her that she has no independence if she is likely doing on her own.      Relevant Medications   buprenorphine (BUTRANS) 10 MCG/HR PTWK   Cardiac pacemaker in situ (Chronic)    Thankfully, she does have a pacemaker in place which will allow [place] to increase her beta-blocker without concerns for bradycardia.      Hypokalemia   Relevant Orders   EKG 12-Lead (Completed)   Comprehensive metabolic panel (Completed)   Malnutrition of moderate degree    She seems to be putting on weight now.  Eating better.      Orthostatic dizziness    Stable.  Try to avoid too aggressive BP control.      Depression with anxiety    I think this is no longer major features for the, but since he seems to be a little less failure to thrive but she had been, eating well and better, but is definitely down depressed.  She currently only has as needed Ativan for anxiety but no standing medication.  Defer to PCP.          COVID-19 Education: The signs and symptoms of COVID-19 were discussed with the patient and how to seek care for testing (follow up with PCP or arrange E-visit).   The importance of social distancing and COVID-19 vaccination was discussed today.  The patient is practicing social distancing & Masking.   I spent a total of 28 minutes with the patient spent in direct patient consultation.  Additional time spent with chart review  / charting (studies, outside notes, etc): 14 Total Time: 42 min  Current medicines are reviewed at length with the patient today.  (+/- concerns) still not sure how to manage BP.   Notice: This dictation was prepared with Dragon dictation along with smaller phrase technology. Any transcriptional errors that result from this process are unintentional and may not be corrected upon review.  Patient  Instructions / Medication Changes & Studies & Tests Ordered   Patient Instructions  Medication Instructions:   increase  Carvedilol 6.25 mg twice a day   Take lasix at least 3 days -       *If you need a  refill on your cardiac medications before your next appointment, please call your pharmacy*   Lab Work: cmp -  Next week  If you have labs (blood work) drawn today and your tests are completely normal, you will receive your results only by: Marland Kitchen MyChart Message (if you have MyChart) OR . A paper copy in the mail If you have any lab test that is abnormal or we need to change your treatment, we will call you to review the results.   Testing/Procedures: Not needed   Follow-Up: At [facility], you and your health needs are our priority.  As part of our continuing mission to provide you with exceptional heart care, we have created designated Provider Care Teams.  These Care Teams include your primary Cardiologist (physician) and Advanced Practice Providers (APPs -  Physician Assistants and Nurse Practitioners) who all work together to provide you with the care you need, when you need it.     Your next appointment:    keep appointment for March 2022  The format for your next appointment:   In Person  Provider:   Glenetta Hew, MD   Other Instructions   wear  Ace wraps on legs for swelling and remove at night  Until you can use your support socks/hose  elevate feet while seating   Eat anything you like except highly salty foods     Studies Ordered:   Orders Placed This Encounter  Procedures  . Comprehensive metabolic panel  . EKG 12-Lead     Glenetta Hew, M.D., M.S. Interventional Cardiologist   Pager # [phone] Phone # [phone] [address] [place], [place] [address]   Thank you for choosing Heartcare at [facility]!!

## 2020-10-02 DIAGNOSIS — E876 Hypokalemia: Secondary | ICD-10-CM | POA: Diagnosis not present

## 2020-10-02 DIAGNOSIS — N182 Chronic kidney disease, stage 2 (mild): Secondary | ICD-10-CM | POA: Diagnosis not present

## 2020-10-02 DIAGNOSIS — I447 Left bundle-branch block, unspecified: Secondary | ICD-10-CM | POA: Diagnosis not present

## 2020-10-02 DIAGNOSIS — I4819 Other persistent atrial fibrillation: Secondary | ICD-10-CM | POA: Diagnosis not present

## 2020-10-02 DIAGNOSIS — I130 Hypertensive heart and chronic kidney disease with heart failure and stage 1 through stage 4 chronic kidney disease, or unspecified chronic kidney disease: Secondary | ICD-10-CM | POA: Diagnosis not present

## 2020-10-02 DIAGNOSIS — I5032 Chronic diastolic (congestive) heart failure: Secondary | ICD-10-CM | POA: Diagnosis not present

## 2020-10-03 LAB — COMPREHENSIVE METABOLIC PANEL
ALT: 7 IU/L (ref 0–32)
AST: 16 IU/L (ref 0–40)
Albumin/Globulin Ratio: 1.8 (ref 1.2–2.2)
Albumin: 4.3 g/dL (ref 3.5–4.6)
Alkaline Phosphatase: 70 IU/L (ref 44–121)
BUN/Creatinine Ratio: 23 (ref 12–28)
BUN: 38 mg/dL — ABNORMAL HIGH (ref 10–36)
Bilirubin Total: 0.3 mg/dL (ref 0.0–1.2)
CO2: 24 mmol/L (ref 20–29)
Calcium: 8.5 mg/dL — ABNORMAL LOW (ref 8.7–10.3)
Chloride: 101 mmol/L (ref 96–106)
Creatinine, Ser: 1.66 mg/dL — ABNORMAL HIGH (ref 0.57–1.00)
GFR calc Af Amer: 30 mL/min/{1.73_m2} — ABNORMAL LOW (ref >59)
GFR calc non Af Amer: 26 mL/min/{1.73_m2} — ABNORMAL LOW (ref >59)
Globulin, Total: 2.4 g/dL (ref 1.5–4.5)
Glucose: 84 mg/dL (ref 65–99)
Potassium: 4.6 mmol/L (ref 3.5–5.2)
Sodium: 141 mmol/L (ref 134–144)
Total Protein: 6.7 g/dL (ref 6.0–8.5)

## 2020-10-08 ENCOUNTER — Encounter: Payer: Self-pay | Admitting: Cardiology

## 2020-10-08 NOTE — Assessment & Plan Note (Signed)
She has chronic pain from arthritis which is really the most limiting feature for her.  This is probably driving her blood pressure to be high.  I really think with her level of disability that she would be best served either in an assisted living or living with her daughter.  She is really reluctant to do this because she does not want to get up.  Dependence, I explained to her that she has no independence if she is likely doing on her own.

## 2020-10-08 NOTE — Assessment & Plan Note (Addendum)
She still has pretty significant blood pressures, but has had some orthostatic symptoms as well.  She is currently taking hydralazine 3 times daily but with 100 mg in the morning and p.m. and 75 in the midday.  Indicated that she can take an additional 75 to 1 mg for isolated systolic BP readings that are over 160 mmHg  Plan:  We will increase carvedilol to 6.25 mg twice daily  Lasix at least 3 days a week and PRN for additional days  Use as needed 100 mg hydralazine as noted

## 2020-10-08 NOTE — Assessment & Plan Note (Addendum)
She seems to be putting on weight now.  Eating better.

## 2020-10-08 NOTE — Assessment & Plan Note (Deleted)
She has chronic left leg rash block is also paced now.  Nonischemic Myoview in 2014.  No anginal or CHF symptoms.

## 2020-10-08 NOTE — Assessment & Plan Note (Signed)
Thankfully, despite having significant hypertension, she is not having true CHF symptoms.  Edema is probably venous stasis related and she is now on Lasix which I recommended she take at least 3 days a week.  With edema I recommend that she use support stockings as well.  We are increasing carvedilol dose to 6.25 mg twice daily and allow for additional 100 mg of hydralazine as needed for elevated systolic pressures greater than 160 mgHg

## 2020-10-08 NOTE — Assessment & Plan Note (Signed)
Thankfully, she does have a pacemaker in place which will allow [place] to increase her beta-blocker without concerns for bradycardia.

## 2020-10-08 NOTE — Assessment & Plan Note (Signed)
Stable.  Try to avoid too aggressive BP control.

## 2020-10-08 NOTE — Assessment & Plan Note (Signed)
I think this is no longer major features for the, but since he seems to be a little less failure to thrive but she had been, eating well and better, but is definitely down depressed.  She currently only has as needed Ativan for anxiety but no standing medication.  Defer to PCP.

## 2020-10-08 NOTE — Assessment & Plan Note (Signed)
Her heart rate here in the 60s, elevated between 85 to 100 bpm.  I am perfectly fine with these heart rates, but because he is concerned about heart rate and palpitations, we will increase carvedilol to 6.25 mg twice daily.  Currently paced with some PVCs.. She is on reduced dose apixaban

## 2020-10-09 ENCOUNTER — Ambulatory Visit: Payer: Medicare PPO

## 2020-10-09 NOTE — Progress Notes (Deleted)
Patient ID: [name]                 DOB: 02-01-1924                      MRN: [id number]     HPI:  [name] is a 86 y.o. female referred by Dr. Ellyn Hack to HTN clinic.   Current HTN meds:  Amlodipine 2.5mg  daily Carvedilol 6.25mg  twice daily Furosemide 20mg  3x/week Hydralazine 100mg  in AM and PM Hydralazine 75mg  at mid-day  Previously tried:   BP goal: <140/90  Family History:   Social History:   Diet:   Exercise:   Home BP readings:   Wt Readings from Last 3 Encounters:  09/22/20 114 lb 3.2 oz (51.8 kg)  09/07/20 113 lb 9.6 oz (51.5 kg)  08/15/20 107 lb 3.2 oz (48.6 kg)   BP Readings from Last 3 Encounters:  09/22/20 (!) 158/72  09/07/20 (!) 162/74  08/15/20 (!) 190/80   Pulse Readings from Last 3 Encounters:  09/22/20 65  09/07/20 67  08/15/20 70    Renal function: CrCl cannot be calculated (Unknown ideal weight.).  Past Medical History:  Diagnosis Date  . Allergy   . Anemia   . Anxiety   . Arthritis   . Asthma   . Benign positional vertigo   . Blood transfusion without reported diagnosis   . Cardiac pacemaker in situ 05/01/2015   for syncope & Sx Bradycardia -> Medtronic MRI compatible pacemaker placed Dr. Caryl Comes   . Cataract   . Chronic diarrhea   . Chronic kidney disease, stage IV (severe) ([place])   . Colon polyps   . Diastolic dysfunction, left ventricle 11/03/2013  . Diverticulosis   . Esophageal stricture   . GERD (gastroesophageal reflux disease)   . Gout   . Heart murmur   . Hemorrhoids   . Hyperlipidemia   . Hypertensive heart disease    no significant RAS by MRA 2002- (<30% LRAS)   . Hypothyroidism   . IBS (irritable bowel syndrome)   . LBBB (left bundle branch block)- new 11/01/13 11/01/2013  . Paroxysmal atrial fibrillation 11/01/2013   Intermittent through the years and recurrent in December of 2014 treated with amiodarone;; Negative Myoview 10/2013  . Peripheral vascular disease    40-98% LICA, 1-19% RICA by doppler  2009   . Sinus arrest 04/28/2015  . Syncope and collapse 04/28/2015   s/p PPM  . Tubulovillous adenoma 4/07    Current Outpatient Medications on File Prior to Visit  Medication Sig Dispense Refill  . acetaminophen (TYLENOL) 325 MG tablet Take 2 tablets (650 mg total) by mouth every 6 (six) hours as needed for mild pain (or Fever >/= 101).    Marland Kitchen amLODipine (NORVASC) 2.5 MG tablet Take 5 mg by mouth daily.    Marland Kitchen apixaban (ELIQUIS) 2.5 MG TABS tablet Take 1 tablet (2.5 mg total) by mouth 2 (two) times daily. 180 tablet 3  . buprenorphine (BUTRANS) 10 MCG/HR PTWK Place onto the skin.    . carvedilol (COREG) 6.25 MG tablet Take 1 tablet (6.25 mg total) by mouth 2 (two) times daily. 180 tablet 3  . furosemide (LASIX) 20 MG tablet     . hydrALAZINE (APRESOLINE) 100 MG tablet Take 1 tablet (100 mg) in the AM and 1 tablet (100 mg) in the PM. 180 tablet 3  . hydrALAZINE (APRESOLINE) 25 MG tablet Take 75 mg (3 tablets) daily at noon-3pm 270 tablet 3  .  hyoscyamine (LEVSIN) 0.125 MG/5ML ELIX Take by mouth.    . levothyroxine (SYNTHROID) 88 MCG tablet TAKE 1 TABLET (88 MCG TOTAL) BY MOUTH DAILY BEFORE BREAKFAST. 90 tablet 3  . LORazepam (ATIVAN) 0.5 MG tablet Take 1 tablet (0.5 mg total) by mouth every 8 (eight) hours as needed for anxiety. 90 tablet 5  . mirabegron ER (MYRBETRIQ) 25 MG TB24 tablet Take 1 tablet by mouth daily.    . nitroGLYCERIN (NITROSTAT) 0.4 MG SL tablet Place 1 tablet (0.4 mg total) under the tongue every 5 (five) minutes x 3 doses as needed for chest pain. 25 tablet 3  . omeprazole (PRILOSEC) 40 MG capsule Take 1 capsule (40 mg total) by mouth 2 (two) times daily. NEEDS APPT IN APRIL 60 capsule 11  . potassium chloride SA (KLOR-CON) 20 MEQ tablet Take 1 tablet (20 mEq total) by mouth as needed (When taking Lasix). 30 tablet 3  . senna-docusate (SENOKOT-S) 8.6-50 MG tablet Take by mouth.    . temazepam (RESTORIL) 15 MG capsule TAKE 2 CAPSULES AT BEDTIME AS NEEDED FOR SLEEP 60 capsule 5    No current facility-administered medications on file prior to visit.    Allergies  Allergen Reactions  . Penicillins Swelling    REACTION: nausea, swelling  . Xifaxan [Rifaximin] Other (See Comments) and Rash    unknown  . Ambien [Zolpidem Tartrate]     " made her crazy "  . Amoxicillin     REACTION: unspecified  . Ceftin [Cefuroxime Axetil] Diarrhea  . Codeine Phosphate     REACTION: unspecified  . Colchicine     Severe diarrhea  . Levofloxacin Other (See Comments)    Tingle numbness  . Lipitor [Atorvastatin] Other (See Comments)    "makes legs jump all night"    There were no vitals taken for this visit.  No problem-specific Assessment & Plan notes found for this encounter.   Nupur Hohman Rodriguez-Guzman PharmD, BCPS, [place] [address] 10/09/2020 9:33 AM

## 2020-10-13 ENCOUNTER — Telehealth: Payer: Self-pay | Admitting: *Deleted

## 2020-10-13 DIAGNOSIS — I5032 Chronic diastolic (congestive) heart failure: Secondary | ICD-10-CM

## 2020-10-13 DIAGNOSIS — N184 Chronic kidney disease, stage 4 (severe): Secondary | ICD-10-CM

## 2020-10-13 DIAGNOSIS — I130 Hypertensive heart and chronic kidney disease with heart failure and stage 1 through stage 4 chronic kidney disease, or unspecified chronic kidney disease: Secondary | ICD-10-CM

## 2020-10-13 DIAGNOSIS — R7989 Other specified abnormal findings of blood chemistry: Secondary | ICD-10-CM

## 2020-10-13 NOTE — Telephone Encounter (Signed)
-----   Message from [name], MD sent at 10/08/2020  2:18 PM EST ----- Chemistry numbers show slightly worsening kidney function -> likely borderline dehydration. -> Recommend increased oral fluid intake-8 to 10 glasses (8 ounces) of water  Otherwise normal values.  Should recheck in [facility]  Glenetta Hew, MD

## 2020-10-13 NOTE — Telephone Encounter (Signed)
Left detailed message on [name]'s voicemail concerning recent labs of the patient. Mailed lab slip for recheck  BMP in 2 weeks

## 2020-10-16 ENCOUNTER — Other Ambulatory Visit: Payer: Self-pay | Admitting: Family Medicine

## 2020-10-16 NOTE — Telephone Encounter (Signed)
    Pt's daughter returning call, she said she has some questions about lab work and wanted to discuss about pt's condition

## 2020-10-16 NOTE — Telephone Encounter (Signed)
Spoke with patient daughter in law, Butch Penny, okay per [name]. Made her aware of lab work results are stable and when to return for blood work.   Butch Penny stated that patient is not eating again and weight is down to 105.2lb as of yesterday. She is encourageing fluids but patient only has bout 24 oz of water yesterday and not much to eat.  Swelling in legs has gotten better except there is still swelling of the mid-calf to ankles. This does go away at night and patient has not been consistent with wearing compression stockings.   Encouraged fluids, keep taking medications as ordered, wear compression socks daily with legs elevated when sitting. Told her I would make Dr. Ellyn Hack aware of weight change.  No additional questions at this time.

## 2020-10-23 NOTE — Telephone Encounter (Signed)
Spoke to patient's daughter n Sports coach.Stated she wanted Dr.Harding to know patient continues to have some swelling in both lower legs.Stated legs are better since she is taking Lasix 20 mg on Mon-Wed-Fri.Stated she is having a repeat bmet this Wed 12/15.Stated she lays in bed most of day.She complains every time she eats food goes right through her causing diarrhea.Stated she does not have diarrhea.She complains of dizzy and light headed.She will continue to monitor her symptoms.Advised she needs to eat better.I will send message to [place].

## 2020-10-23 NOTE — Telephone Encounter (Signed)
STAT if patient feels like he/she is going to faint   1) Are you dizzy now? When she stands up.    2) Do you feel faint or have you passed out? No but states when she stands up she does feel swimmy headed. And mentioned that she did feel like she was going to pass this weekend.   3) Do you have any other symptoms? Patient is borderline dehydrated, states that any food and drink runs through her.   4) Have you checked your HR and BP (record if available)? BP 170's - Butch Penny states that she is not taking her medication consistently because she does not feel good but she was able to take her medication as directed yesterday and today.

## 2020-10-25 DIAGNOSIS — R7989 Other specified abnormal findings of blood chemistry: Secondary | ICD-10-CM | POA: Diagnosis not present

## 2020-10-25 DIAGNOSIS — I5032 Chronic diastolic (congestive) heart failure: Secondary | ICD-10-CM | POA: Diagnosis not present

## 2020-10-25 DIAGNOSIS — I130 Hypertensive heart and chronic kidney disease with heart failure and stage 1 through stage 4 chronic kidney disease, or unspecified chronic kidney disease: Secondary | ICD-10-CM | POA: Diagnosis not present

## 2020-10-25 DIAGNOSIS — N184 Chronic kidney disease, stage 4 (severe): Secondary | ICD-10-CM | POA: Diagnosis not present

## 2020-10-26 LAB — BASIC METABOLIC PANEL
BUN/Creatinine Ratio: 19 (ref 12–28)
BUN: 25 mg/dL (ref 10–36)
CO2: 29 mmol/L (ref 20–29)
Calcium: 8.8 mg/dL (ref 8.7–10.3)
Chloride: 97 mmol/L (ref 96–106)
Creatinine, Ser: 1.31 mg/dL — ABNORMAL HIGH (ref 0.57–1.00)
GFR calc Af Amer: 40 mL/min/{1.73_m2} — ABNORMAL LOW (ref >59)
GFR calc non Af Amer: 35 mL/min/{1.73_m2} — ABNORMAL LOW (ref >59)
Glucose: 83 mg/dL (ref 65–99)
Potassium: 4.2 mmol/L (ref 3.5–5.2)
Sodium: 142 mmol/L (ref 134–144)

## 2020-10-26 NOTE — Telephone Encounter (Signed)
[name] called back in because she wanted to notify someone that she sent Dr. Ellyn Hack a patient message. This is in regards to the ongoing issues from this telephone note. [name] states the new blood work came back and she would like to know if there is anything there that indicates why patient might be feeling so bad. Patient message copy and pasted below for reference   Patient message:  She is miserable. depressed, no energy, dizzy/(swimmy headed she calls it), has periods of shortness of breath, her legs are still a bit swollen, her stomach gives her problems each day ( she says dirrea)   and just wants to feel better.   I know her bloodwork just came in and we are wondering if there is any indication of an underlying issue other that just her age. She is asking if you can admit her to for fluids and to find out what is wrong with her.  Just let me know your thoughts.   Please call/advise  Thank you!

## 2020-10-26 NOTE — Telephone Encounter (Signed)
Labs in [place] - routed to MD/covering nurse to review and advise.   Operator had copied MyChart message into this phone note

## 2020-10-27 NOTE — Telephone Encounter (Signed)
Spoke to patient's daughter n law Butch Penny advised to take patient to ED if she continues to feel bad.

## 2020-10-30 ENCOUNTER — Telehealth: Payer: Self-pay | Admitting: Family Medicine

## 2020-10-30 NOTE — Telephone Encounter (Signed)
Pt is having some health issues and a lot of depression and wanted to only speak with Dr. Sarajane Jews.  Pt was transferred to the Triage Nurse.

## 2020-10-31 NOTE — Telephone Encounter (Signed)
I agree that going in through ER is the best way to get admitted -- too hard to do direct admit & would be Medicine Admission.  Glenetta Hew, MD

## 2020-11-01 NOTE — Telephone Encounter (Signed)
Set up an OV for this

## 2020-11-01 NOTE — Telephone Encounter (Signed)
Spoke with the pt and scheduled an appt for 12/22 to arrive at 10:30am.

## 2020-11-02 ENCOUNTER — Ambulatory Visit: Payer: Medicare PPO | Admitting: Family Medicine

## 2020-11-11 ENCOUNTER — Other Ambulatory Visit: Payer: Self-pay | Admitting: Family Medicine

## 2020-11-13 NOTE — Telephone Encounter (Signed)
Received a refill request for: Restoril 15 mg LR 05/16/20, #60, 5 rf LOV 08/15/20 FOV 11/22/20  Please review and advise.  Thanks. Marland KitchenDm/cma

## 2020-11-14 ENCOUNTER — Encounter: Payer: Self-pay | Admitting: Pharmacist

## 2020-11-14 ENCOUNTER — Ambulatory Visit (INDEPENDENT_AMBULATORY_CARE_PROVIDER_SITE_OTHER): Payer: Medicare PPO | Admitting: Pharmacist

## 2020-11-14 ENCOUNTER — Other Ambulatory Visit: Payer: Self-pay

## 2020-11-14 VITALS — BP 132/60 | HR 78 | Wt 118.0 lb

## 2020-11-14 DIAGNOSIS — I110 Hypertensive heart disease with heart failure: Secondary | ICD-10-CM | POA: Diagnosis not present

## 2020-11-14 MED ORDER — AMLODIPINE BESYLATE 2.5 MG PO TABS: 2.5000 mg | ORAL_TABLET | Freq: Every day | ORAL | 0 refills | Status: DC

## 2020-11-14 NOTE — Progress Notes (Signed)
Patient ID: [name]                 DOB: 1924-02-19                      MRN: [id number]     HPI: [name] is a 87 y.o. female referred by Dr. Ellyn Hack to HTN clinic. PMH is significant for A Fib, HTN, depression, cardiac pacemaker, and CHF.  At last visit with [name], carvedilol was increased 6.25mg  BID and patient was instructed to take furosemide 20mg  three times a week.  Patient presents today with son.  Daughter in law takes care of patient's medications but is not available.  Patient and son unsure of what medications patient is supposed to be taking.    Patient having significant edema in both legs and is also reporting SOB.  Purchased a pair of compression stockings but is unable to get them on.  Has been slowly gaining weight.  Complaints of depression and anxiety.  Is not on maintenance antidepressants. Takes PRN lorazepam.  Current HTN meds:  Amlodipine 5mg  daily, carvedilol 6.25mg  BID, furosemide 20 mg daily, hydralazine 100mg  BID and hydralazine 75 mg at noon,   Wt Readings from Last 3 Encounters:  09/22/20 114 lb 3.2 oz (51.8 kg)  09/07/20 113 lb 9.6 oz (51.5 kg)  08/15/20 107 lb 3.2 oz (48.6 kg)   BP Readings from Last 3 Encounters:  09/22/20 (!) 158/72  09/07/20 (!) 162/74  08/15/20 (!) 190/80   Pulse Readings from Last 3 Encounters:  09/22/20 65  09/07/20 67  08/15/20 70    Renal function: CrCl cannot be calculated (Unknown ideal weight.).  Past Medical History:  Diagnosis Date  . Allergy   . Anemia   . Anxiety   . Arthritis   . Asthma   . Benign positional vertigo   . Blood transfusion without reported diagnosis   . Cardiac pacemaker in situ 05/01/2015   for syncope & Sx Bradycardia -> Medtronic MRI compatible pacemaker placed Dr. Caryl Comes   . Cataract   . Chronic diarrhea   . Chronic kidney disease, stage IV (severe) ([employer])   . Colon polyps   . Diastolic dysfunction, left ventricle 11/03/2013  . Diverticulosis   . Esophageal  stricture   . GERD (gastroesophageal reflux disease)   . Gout   . Heart murmur   . Hemorrhoids   . Hyperlipidemia   . Hypertensive heart disease    no significant RAS by MRA 2002- (<30% LRAS)   . Hypothyroidism   . IBS (irritable bowel syndrome)   . LBBB (left bundle branch block)- new 11/01/13 11/01/2013  . Paroxysmal atrial fibrillation 11/01/2013   Intermittent through the years and recurrent in December of 2014 treated with amiodarone;; Negative Myoview 10/2013  . Peripheral vascular disease    71-24% LICA, 5-80% RICA by doppler 2009   . Sinus arrest 04/28/2015  . Syncope and collapse 04/28/2015   s/p PPM  . Tubulovillous adenoma 4/07    Current Outpatient Medications on File Prior to Visit  Medication Sig Dispense Refill  . acetaminophen (TYLENOL) 325 MG tablet Take 2 tablets (650 mg total) by mouth every 6 (six) hours as needed for mild pain (or Fever >/= 101).    Marland Kitchen amLODipine (NORVASC) 2.5 MG tablet Take 5 mg by mouth daily.    Marland Kitchen apixaban (ELIQUIS) 2.5 MG TABS tablet Take 1 tablet (2.5 mg total) by mouth 2 (two) times daily. 180 tablet  3  . carvedilol (COREG) 6.25 MG tablet Take 1 tablet (6.25 mg total) by mouth 2 (two) times daily. 180 tablet 3  . furosemide (LASIX) 20 MG tablet     . hydrALAZINE (APRESOLINE) 100 MG tablet Take 1 tablet (100 mg) in the AM and 1 tablet (100 mg) in the PM. 180 tablet 3  . hydrALAZINE (APRESOLINE) 25 MG tablet Take 75 mg (3 tablets) daily at noon-3pm 270 tablet 3  . hyoscyamine (LEVSIN) 0.125 MG/5ML ELIX Take by mouth.    . levothyroxine (SYNTHROID) 88 MCG tablet TAKE 1 TABLET DAILY BEFORE BREAKFAST 90 tablet 3  . LORazepam (ATIVAN) 0.5 MG tablet Take 1 tablet (0.5 mg total) by mouth every 8 (eight) hours as needed for anxiety. 90 tablet 5  . mirabegron ER (MYRBETRIQ) 25 MG TB24 tablet Take 1 tablet by mouth daily.    . nitroGLYCERIN (NITROSTAT) 0.4 MG SL tablet Place 1 tablet (0.4 mg total) under the tongue every 5 (five) minutes x 3 doses as  needed for chest pain. 25 tablet 3  . omeprazole (PRILOSEC) 40 MG capsule Take 1 capsule (40 mg total) by mouth 2 (two) times daily. NEEDS APPT IN APRIL 60 capsule 11  . potassium chloride SA (KLOR-CON) 20 MEQ tablet Take 1 tablet (20 mEq total) by mouth as needed (When taking Lasix). 30 tablet 3  . senna-docusate (SENOKOT-S) 8.6-50 MG tablet Take by mouth.    . temazepam (RESTORIL) 15 MG capsule TAKE 2 CAPSULES AT BEDTIME AS NEEDED FOR SLEEP 60 capsule 5   No current facility-administered medications on file prior to visit.    Allergies  Allergen Reactions  . Penicillins Swelling    REACTION: nausea, swelling  . Xifaxan [Rifaximin] Other (See Comments) and Rash    unknown  . Ambien [Zolpidem Tartrate]     " made her crazy "  . Amoxicillin     REACTION: unspecified  . Ceftin [Cefuroxime Axetil] Diarrhea  . Codeine Phosphate     REACTION: unspecified  . Colchicine     Severe diarrhea  . Levofloxacin Other (See Comments)    Tingle numbness  . Lipitor [Atorvastatin] Other (See Comments)    "makes legs jump all night"     Assessment/Plan:  1. Hypertension - Patient BP in room today well controlled at 132/60.  Concern regarding patient's LEE and SOB.  Recommended patient increase furosemide 20mg  to daily administration rather than 3x a week for 7 days to see if this gives her relief.  Recommended compression stockings and to purchase the style with zippers on sides to ease taking on and off.  Contacted patient's pharmacy to verify medication list.  Patient has picked up both carvedilol 6.25mg  and 3.125mg .  Advised patient can use two tablets of the lower dose twice a day.    Has appointment with PCP.  Recommend discussing depression and anxiety as this is likely impacting patient's BP.  Patient voiced understanding.  Karren Cobble, PharmD, BCACP, [place], [place] [address] [place], [place] [address] Phone: [phone]; Fax: [phone] 11/14/2020 5:05 PM

## 2020-11-14 NOTE — Patient Instructions (Addendum)
It was nice meeting you today!  Your blood pressure looks good today! Remember to keep weighing yourself in the mornings  Since your legs are starting to swell, increase your furosemide 20 mg to one tablet every day.  If you do not notice a difference or feel better after a week, please le [place] know  Continue your amlodipine 2.5mg  once a day Continue your hydralazine 100 mg twice a day and 75 in the middle of the day (three 25mg  tablets)  Continue your carvedilol 6.25mg  twice a day.  You can use two of the 3.125mg  tablets twice a day as well.  Karren Cobble, PharmD, BCACP, [place], [place] [address], [place],  [address] Phone: [phone]; Fax: [phone] 11/14/2020 2:44 PM

## 2020-11-15 ENCOUNTER — Ambulatory Visit: Payer: Medicare PPO | Admitting: Family Medicine

## 2020-11-22 ENCOUNTER — Other Ambulatory Visit: Payer: Self-pay

## 2020-11-22 ENCOUNTER — Encounter: Payer: Self-pay | Admitting: Family Medicine

## 2020-11-22 ENCOUNTER — Ambulatory Visit: Payer: Medicare PPO | Admitting: Family Medicine

## 2020-11-22 VITALS — BP 158/90 | HR 60 | Temp 97.8°F | Ht 62.5 in | Wt 116.8 lb

## 2020-11-22 DIAGNOSIS — N184 Chronic kidney disease, stage 4 (severe): Secondary | ICD-10-CM

## 2020-11-22 DIAGNOSIS — F418 Other specified anxiety disorders: Secondary | ICD-10-CM | POA: Diagnosis not present

## 2020-11-22 DIAGNOSIS — I4819 Other persistent atrial fibrillation: Secondary | ICD-10-CM | POA: Diagnosis not present

## 2020-11-22 DIAGNOSIS — R600 Localized edema: Secondary | ICD-10-CM | POA: Diagnosis not present

## 2020-11-22 DIAGNOSIS — J439 Emphysema, unspecified: Secondary | ICD-10-CM | POA: Diagnosis not present

## 2020-11-22 DIAGNOSIS — I130 Hypertensive heart and chronic kidney disease with heart failure and stage 1 through stage 4 chronic kidney disease, or unspecified chronic kidney disease: Secondary | ICD-10-CM

## 2020-11-22 DIAGNOSIS — I5032 Chronic diastolic (congestive) heart failure: Secondary | ICD-10-CM

## 2020-11-22 DIAGNOSIS — N182 Chronic kidney disease, stage 2 (mild): Secondary | ICD-10-CM

## 2020-11-22 MED ORDER — FUROSEMIDE 20 MG PO TABS
20.0000 mg | ORAL_TABLET | Freq: Every day | ORAL | 11 refills | Status: DC
Start: 2020-11-22 — End: 2021-04-11

## 2020-11-22 MED ORDER — NITROGLYCERIN 0.4 MG SL SUBL: 0.4000 mg | SUBLINGUAL_TABLET | SUBLINGUAL | 11 refills | Status: DC | PRN

## 2020-11-22 MED ORDER — POTASSIUM CHLORIDE ER 10 MEQ PO TBCR: 10.0000 meq | EXTENDED_RELEASE_TABLET | Freq: Every day | ORAL | 11 refills | Status: DC

## 2020-11-22 MED ORDER — SERTRALINE HCL 50 MG PO TABS: 50.0000 mg | ORAL_TABLET | Freq: Every day | ORAL | 5 refills | Status: DC

## 2020-11-22 NOTE — Progress Notes (Signed)
   Subjective:    Patient ID: [name], female    DOB: 01-22-1924, 87 y.o.   MRN: [id number]  HPI Here with her daughter for several issues. Her main concern is depression. She has taken Lorazepam for anxiety for years, but she has never been treated for depression. She lives alone (which is what she prefers) but she feels sad and hopeless at times. No suicidal thoughts. The lorazepam helps the anxiety somewhat. Also she knows she should be taking potassium, but she stopped taking these months ago because they were too big to swallow. These were 20 mEq tablets. Also she asks about the swelling in her legs and feet. She saw the pharmacist in Cardiology on 11-14-20, and he advised her to increase the Lasix 20 mg to taking it every day (instead of 3 days a week). Since doing that the swelling has been more manageable, and her legs no longer hurt. She has had 2 Covid vaccines, but she asks if she should get a booster.   Review of Systems  Constitutional: Negative.   Respiratory: Positive for shortness of breath. Negative for cough, chest tightness and wheezing.   Cardiovascular: Positive for leg swelling. Negative for chest pain and palpitations.  Psychiatric/Behavioral: Positive for dysphoric mood and sleep disturbance. Negative for agitation, behavioral problems, confusion, decreased concentration, self-injury and suicidal ideas. The patient is nervous/anxious.        Objective:   Physical Exam Constitutional:      Appearance: Normal appearance.     Comments: Using her walker   Cardiovascular:     Rate and Rhythm: Normal rate. Rhythm irregular.     Pulses: Normal pulses.     Heart sounds: Normal heart sounds.  Pulmonary:     Effort: Pulmonary effort is normal.     Breath sounds: Normal breath sounds.  Musculoskeletal:     Comments: 1+ edema in both ankles  Neurological:     General: No focal deficit present.     Mental Status: She is alert and oriented to person, place, and time.   Psychiatric:        Thought Content: Thought content normal.        Judgment: Judgment normal.     Comments: Depressed affect            Assessment & Plan:  For her depression, we will start Zoloft 50 mg daily. She can still add Lorazepam as needed. She will report back in 3-4 weeks. For the leg edema, she will stay on Lasix 20 mg daily. For the potassium, she will start on Klor-con 10 mEq daily. We will check a BMET in a month.  [name], MD

## 2020-12-15 ENCOUNTER — Other Ambulatory Visit: Payer: Self-pay | Admitting: Family Medicine

## 2020-12-15 ENCOUNTER — Encounter: Payer: Self-pay | Admitting: Family Medicine

## 2020-12-15 ENCOUNTER — Telehealth: Payer: Self-pay | Admitting: Family Medicine

## 2020-12-15 ENCOUNTER — Other Ambulatory Visit: Payer: Self-pay

## 2020-12-15 DIAGNOSIS — I110 Hypertensive heart disease with heart failure: Secondary | ICD-10-CM

## 2020-12-15 MED ORDER — AMLODIPINE BESYLATE 2.5 MG PO TABS: 2.5000 mg | ORAL_TABLET | Freq: Every day | ORAL | 3 refills | Status: DC

## 2020-12-15 MED ORDER — PREDNISONE 5 MG PO TABS: 5.0000 mg | ORAL_TABLET | Freq: Every day | ORAL | 5 refills | Status: DC

## 2020-12-15 NOTE — Telephone Encounter (Signed)
Pt daughter call and stated she sent a message and want someone to look at it today and give her a call back.

## 2020-12-15 NOTE — Telephone Encounter (Signed)
Thanks for the updates. I agree she may have a better appetite with steroids. Call in Prednisone 5 mg daily, #30 with 5 rf. Yes I think it would be good for [facility] to get involved. Also call in Amlodipine 2.5 mg to take daily, #90 with 3 rf

## 2020-12-15 NOTE — Progress Notes (Signed)
Medications sent to pharmacy as requested

## 2020-12-18 ENCOUNTER — Ambulatory Visit (INDEPENDENT_AMBULATORY_CARE_PROVIDER_SITE_OTHER): Payer: Medicare PPO

## 2020-12-18 DIAGNOSIS — I442 Atrioventricular block, complete: Secondary | ICD-10-CM

## 2020-12-18 NOTE — Telephone Encounter (Signed)
Messages were seen and replied by Dr. Sarajane Jews on 12/15/2020.

## 2020-12-19 LAB — CUP PACEART REMOTE DEVICE CHECK
Battery Remaining Longevity: 52 mo
Battery Voltage: 2.98 V
Brady Statistic AP VP Percent: 0 %
Brady Statistic AP VS Percent: 0 %
Brady Statistic AS VP Percent: 0 %
Brady Statistic AS VS Percent: 0 %
Brady Statistic RA Percent Paced: 0 %
Brady Statistic RV Percent Paced: 71.52 %
Date Time Interrogation Session: 20220207132959
Implantable Lead Implant Date: 20160620
Implantable Lead Implant Date: 20160620
Implantable Lead Location: 753859
Implantable Lead Location: 753860
Implantable Lead Model: 5076
Implantable Lead Model: 5076
Implantable Pulse Generator Implant Date: 20160620
Lead Channel Impedance Value: 285 Ohm
Lead Channel Impedance Value: 342 Ohm
Lead Channel Impedance Value: 361 Ohm
Lead Channel Impedance Value: 437 Ohm
Lead Channel Pacing Threshold Amplitude: 0.75 V
Lead Channel Pacing Threshold Amplitude: 0.875 V
Lead Channel Pacing Threshold Pulse Width: 0.4 ms
Lead Channel Pacing Threshold Pulse Width: 0.4 ms
Lead Channel Sensing Intrinsic Amplitude: 0.375 mV
Lead Channel Sensing Intrinsic Amplitude: 0.75 mV
Lead Channel Sensing Intrinsic Amplitude: 14.625 mV
Lead Channel Sensing Intrinsic Amplitude: 14.625 mV
Lead Channel Setting Pacing Amplitude: 2 V
Lead Channel Setting Pacing Pulse Width: 0.4 ms
Lead Channel Setting Sensing Sensitivity: 4 mV

## 2020-12-22 NOTE — Progress Notes (Signed)
Remote pacemaker transmission.   

## 2021-01-10 ENCOUNTER — Ambulatory Visit: Payer: Medicare PPO

## 2021-01-10 ENCOUNTER — Other Ambulatory Visit: Payer: Self-pay

## 2021-01-10 NOTE — Patient Instructions (Incomplete)
[name] , Thank you for taking time to come for your Medicare Wellness Visit. I appreciate your ongoing commitment to your health goals. Please review the following plan we discussed and let me know if I can assist you in the future.   Screening recommendations/referrals: Colonoscopy: *** Mammogram: *** Bone Density: *** Recommended yearly ophthalmology/optometry visit for glaucoma screening and checkup Recommended yearly dental visit for hygiene and checkup  Vaccinations: Influenza vaccine: *** Pneumococcal vaccine: *** Tdap vaccine: *** Shingles vaccine: ***   Covid-19:***  Advanced directives: ***  Conditions/risks identified: ***  Next appointment: Follow up in one year for your annual wellness visit ***   Preventive Care 65 Years and Older, Female Preventive care refers to lifestyle choices and visits with your health care provider that can promote health and wellness. What does preventive care include?  A yearly physical exam. This is also called an annual well check.  Dental exams once or twice a year.  Routine eye exams. Ask your health care provider how often you should have your eyes checked.  Personal lifestyle choices, including:  Daily care of your teeth and gums.  Regular physical activity.  Eating a healthy diet.  Avoiding tobacco and drug use.  Limiting alcohol use.  Practicing safe sex.  Taking low-dose aspirin every day.  Taking vitamin and mineral supplements as recommended by your health care provider. What happens during an annual well check? The services and screenings done by your health care provider during your annual well check will depend on your age, overall health, lifestyle risk factors, and family history of disease. Counseling  Your health care provider may ask you questions about your:  Alcohol use.  Tobacco use.  Drug use.  Emotional well-being.  Home and relationship well-being.  Sexual activity.  Eating  habits.  History of falls.  Memory and ability to understand (cognition).  Work and work Statistician.  Reproductive health. Screening  You may have the following tests or measurements:  Height, weight, and BMI.  Blood pressure.  Lipid and cholesterol levels. These may be checked every 5 years, or more frequently if you are over 13 years old.  Skin check.  Lung cancer screening. You may have this screening every year starting at age 26 if you have a 30-pack-year history of smoking and currently smoke or have quit within the past 15 years.  Fecal occult blood test (FOBT) of the stool. You may have this test every year starting at age 76.  Flexible sigmoidoscopy or colonoscopy. You may have a sigmoidoscopy every 5 years or a colonoscopy every 10 years starting at age 41.  Hepatitis C blood test.  Hepatitis B blood test.  Sexually transmitted disease (STD) testing.  Diabetes screening. This is done by checking your blood sugar (glucose) after you have not eaten for a while (fasting). You may have this done every 1-3 years.  Bone density scan. This is done to screen for osteoporosis. You may have this done starting at age 50.  Mammogram. This may be done every 1-2 years. Talk to your health care provider about how often you should have regular mammograms. Talk with your health care provider about your test results, treatment options, and if necessary, the need for more tests. Vaccines  Your health care provider may recommend certain vaccines, such as:  Influenza vaccine. This is recommended every year.  Tetanus, diphtheria, and acellular pertussis (Tdap, Td) vaccine. You may need a Td booster every 10 years.  Zoster vaccine. You may need  this after age 44.  Pneumococcal 13-valent conjugate (PCV13) vaccine. One dose is recommended after age 69.  Pneumococcal polysaccharide (PPSV23) vaccine. One dose is recommended after age 74. Talk to your health care provider about which  screenings and vaccines you need and how often you need them. This information is not intended to replace advice given to you by your health care provider. Make sure you discuss any questions you have with your health care provider. Document Released: 11/24/2015 Document Revised: 07/17/2016 Document Reviewed: 08/29/2015 Elsevier Interactive Patient Education  2017 [place] Prevention in the Home Falls can cause injuries. They can happen to people of all ages. There are many things you can do to make your home safe and to help prevent falls. What can I do on the outside of my home?  Regularly fix the edges of walkways and driveways and fix any cracks.  Remove anything that might make you trip as you walk through a door, such as a raised step or threshold.  Trim any bushes or trees on the path to your home.  Use bright outdoor lighting.  Clear any walking paths of anything that might make someone trip, such as rocks or tools.  Regularly check to see if handrails are loose or broken. Make sure that both sides of any steps have handrails.  Any raised decks and porches should have guardrails on the edges.  Have any leaves, snow, or ice cleared regularly.  Use sand or salt on walking paths during winter.  Clean up any spills in your garage right away. This includes oil or grease spills. What can I do in the bathroom?  Use night lights.  Install grab bars by the toilet and in the tub and shower. Do not use towel bars as grab bars.  Use non-skid mats or decals in the tub or shower.  If you need to sit down in the shower, use a plastic, non-slip stool.  Keep the floor dry. Clean up any water that spills on the floor as soon as it happens.  Remove soap buildup in the tub or shower regularly.  Attach bath mats securely with double-sided non-slip rug tape.  Do not have throw rugs and other things on the floor that can make you trip. What can I do in the bedroom?  Use  night lights.  Make sure that you have a light by your bed that is easy to reach.  Do not use any sheets or blankets that are too big for your bed. They should not hang down onto the floor.  Have a firm chair that has side arms. You can use this for support while you get dressed.  Do not have throw rugs and other things on the floor that can make you trip. What can I do in the kitchen?  Clean up any spills right away.  Avoid walking on wet floors.  Keep items that you use a lot in easy-to-reach places.  If you need to reach something above you, use a strong step stool that has a grab bar.  Keep electrical cords out of the way.  Do not use floor polish or wax that makes floors slippery. If you must use wax, use non-skid floor wax.  Do not have throw rugs and other things on the floor that can make you trip. What can I do with my stairs?  Do not leave any items on the stairs.  Make sure that there are handrails on both sides of  the stairs and use them. Fix handrails that are broken or loose. Make sure that handrails are as long as the stairways.  Check any carpeting to make sure that it is firmly attached to the stairs. Fix any carpet that is loose or worn.  Avoid having throw rugs at the top or bottom of the stairs. If you do have throw rugs, attach them to the floor with carpet tape.  Make sure that you have a light switch at the top of the stairs and the bottom of the stairs. If you do not have them, ask someone to add them for you. What else can I do to help prevent falls?  Wear shoes that:  Do not have high heels.  Have rubber bottoms.  Are comfortable and fit you well.  Are closed at the toe. Do not wear sandals.  If you use a stepladder:  Make sure that it is fully opened. Do not climb a closed stepladder.  Make sure that both sides of the stepladder are locked into place.  Ask someone to hold it for you, if possible.  Clearly mark and make sure that you  can see:  Any grab bars or handrails.  First and last steps.  Where the edge of each step is.  Use tools that help you move around (mobility aids) if they are needed. These include:  Canes.  Walkers.  Scooters.  Crutches.  Turn on the lights when you go into a dark area. Replace any light bulbs as soon as they burn out.  Set up your furniture so you have a clear path. Avoid moving your furniture around.  If any of your floors are uneven, fix them.  If there are any pets around you, be aware of where they are.  Review your medicines with your doctor. Some medicines can make you feel dizzy. This can increase your chance of falling. Ask your doctor what other things that you can do to help prevent falls. This information is not intended to replace advice given to you by your health care provider. Make sure you discuss any questions you have with your health care provider. Document Released: 08/24/2009 Document Revised: 04/04/2016 Document Reviewed: 12/02/2014 Elsevier Interactive Patient Education  2017 [employer].

## 2021-01-31 ENCOUNTER — Ambulatory Visit: Payer: Medicare PPO

## 2021-02-01 ENCOUNTER — Encounter: Payer: Self-pay | Admitting: Cardiology

## 2021-02-01 ENCOUNTER — Ambulatory Visit: Payer: Medicare PPO | Admitting: Cardiology

## 2021-02-01 ENCOUNTER — Other Ambulatory Visit: Payer: Self-pay

## 2021-02-01 VITALS — BP 121/72 | HR 66 | Ht 62.0 in | Wt 102.2 lb

## 2021-02-01 DIAGNOSIS — N182 Chronic kidney disease, stage 2 (mild): Secondary | ICD-10-CM

## 2021-02-01 DIAGNOSIS — I130 Hypertensive heart and chronic kidney disease with heart failure and stage 1 through stage 4 chronic kidney disease, or unspecified chronic kidney disease: Secondary | ICD-10-CM

## 2021-02-01 DIAGNOSIS — I5032 Chronic diastolic (congestive) heart failure: Secondary | ICD-10-CM

## 2021-02-01 DIAGNOSIS — E440 Moderate protein-calorie malnutrition: Secondary | ICD-10-CM

## 2021-02-01 DIAGNOSIS — R42 Dizziness and giddiness: Secondary | ICD-10-CM

## 2021-02-01 DIAGNOSIS — I4819 Other persistent atrial fibrillation: Secondary | ICD-10-CM

## 2021-02-01 DIAGNOSIS — I110 Hypertensive heart disease with heart failure: Secondary | ICD-10-CM

## 2021-02-01 NOTE — Progress Notes (Signed)
Primary Care Provider: [name], MD Cardiologist: Glenetta Hew, MD Electrophysiologist: None  Clinic Note: Chief Complaint  Patient presents with  . Follow-up    Actually doing okay.  Stable blood pressure and swelling.  . Atrial Fibrillation    No sensation  . Hypertension    Labile, but stable now.    ===================================  ASSESSMENT/PLAN   Problem List Items Addressed This Visit    Malnutrition of moderate degree    She needs protein.  He is to eat more.  She is to supplement her diet with protein shakes.  Can add protein powder to smoothies, can also try to eat more eggs and drink more milk.  Since she cannot eat large amounts, she does eat more frequently, smaller meals.      Relevant Orders   EKG 12-Lead (Completed)   Orthostatic dizziness    Avoid excess blood pressure control.  Hence the reason for having your home dose of hydralazine for blood pressure is low.  If she feels dizzy, she will check her blood pressure, if well, she will hold the dose hydralazine.  I also encouraged her to drink adequate amount of hydration-trying to achieve 80 ounces a day at a minimum.      Persistent Paroxysmal atrial fibrillation: CHA2DS2-VASc Score 5, on Eliquis (Chronic)    Seems to be rate controlled.  Has no sensation of being in A. fib.  She has knee pains right now.  Current dose of carvedilol. No bleeding issues on apixaban.  Continue.      Relevant Orders   EKG 12-Lead (Completed)   Hypertensive heart and chronic kidney disease with chronic diastolic congestive heart failure ([place]) - Primary (Chronic)    Stable creatinine.  Stable blood pressures.  She is actually on a stable regimen right now with low-dose of amlodipine, along with moderate dose carvedilol. She is on hydralazine (we need to confirm whether she is taking 100 mg or 75 mg twice daily)   For PRN dosing: She can take an additional dose of hydralazine as needed for systolic pressures  sustained greater than 170 mmHg.  If blood pressure less than 120 mmHg, she should hold a dose of hydralazine.         ===================================  HPI:    [name] is a 87 y.o. female with a PMH Notable for Longstanding A. fib (history tachybradycardia and CHB-syncope, s/p PPM), HFpEF as well as Labile/Difficult to Control HTN Complicated by Orthostatic Hypotension who presents today for 52-monthfollow-up for me in 241-monthollow-up from [facility].. Marland Kitchen History of chronic/permanent A. fib no longer on amiodarone; on apixaban and carvedilol ? S/p PPM in VVIR mode for SSS/tachybradycardia. ? Echo in 2018 showed normal LV function mild LVH and severe LAE. ? Myoview in May 2018 low risk.  Labile HTN with mild HFpEF, chronic LE edema.=> Plan is to allow for permissive hypertension and use as needed hydralazine for elevated BP.  Significant cervical and lumbar spine disease.  Has intermittent episodes of failure to thrive with refusal to eat => this was related to ongoing back pain, revitalized after prednisone taper.  Now lives at home alone but as constant check in by daughter and son-in-law.  This is by choice, does not want to be a burden.  Lindsey SCHLIEPas last seen by me on 09/22/2020.  Indicated that she had not been feeling well for 6-70-monthGetting worse.  No energy.  Heart rates were anywhere from the  80s to the low 100s and blood pressure ranging from 140/60 up to 180/80.  I know that she seemed extremely depressed although adamant about living alone, she is clearly not capable of doing his own caring for herself.  She requires significant assistance.  Does not want to go to assisted living and does not live with her daughter.  Very debilitated with back pain.  Also, often does not remember to take medications.  Still having labile blood pressure but no real symptoms of headache or dizziness blurred vision etc. with high blood pressure recordings.  No real tachycardia.   Intermittent sharp and ischemic pain.  Although sensation of fast heart rates that are not recorded. => Notes fatigue, exertional dyspnea with overexertion, leg edema.  Limited activity by the leg joint and back pain.  Has dizziness and poor balance.  Carvedilol increased to 6.25 mg twice daily and told to take Lasix at least 3 times a week and more frequently if necessary.  Also continue as needed hydralazine along with standing dose.  (Essentially this would be the third dose if necessary)  Recent Hospitalizations: None  Seen by Rollen Sox, RPH from [facility] on Jan 4th : noted worsening edema & SOB=> told to take Lasix daily.  BP well controlled. Recommended Spt Hose. Educated on appropriate Carvedilol Dose - had both 3.125 & 6.25 mg.   PCP recently started Zoloft as well as PRN lorazepam.  Was told to restart potassium 10 mg along with more frequent dosing of Lasix.  Reviewed  CV studies:    The following studies were reviewed today: (if available, images/films reviewed: From Epic Chart or Care Everywhere) . None: No arrhythmia noted on device interrogation.   Interval History:   [name] returns today overall doing okay.  Her blood pressures have been stable.  She is actually had some low blood pressures which has been elevated lightheaded.  She says that the swelling is notably improved since she increased her Lasix dosing to almost every day.  Some days she does Tikosyn eval. She has not had to use as needed hydralazine for high blood pressures.  No real issues with chest pain or pressure, just some mild musculoskeletal symptoms.  Still has unsteady gait, has not had any falls.  No syncope or near syncope.  No TIA or amaurosis fugax.  She has not felt any versus of tachycardia that she has had before.  No sensation of A. fib.  Patient been feeling her pacemaker kicking in.  CV Review of Symptoms (Summary) Cardiovascular ROS: positive for - chest pain, dyspnea  on exertion, edema, irregular heartbeat and Musculoskeletal chest pain off and on.  More of a sharp twinging pain with movements.  Exertional dyspnea because of deconditioning.  Stable edema. negative for - orthopnea, paroxysmal nocturnal dyspnea, rapid heart rate, shortness of breath or Syncope/near syncope or TIA/amaurosis fugax, claudication  The patient does not have symptoms concerning for COVID-19 infection (fever, chills, cough, or new shortness of breath).   REVIEWED OF SYSTEMS   Review of Systems  Constitutional: Positive for malaise/fatigue and weight loss (Not eating).  HENT: Negative for ear discharge and nosebleeds.   Respiratory: Positive for shortness of breath (If she overdoes it). Negative for cough.   Gastrointestinal: Negative for abdominal pain, blood in stool and melena.  Genitourinary: Negative for hematuria.  Musculoskeletal: Positive for back pain and joint pain. Negative for falls.  Neurological: Positive for dizziness (Off-and-on,) and weakness (Generalized).  Psychiatric/Behavioral: Positive for  depression and memory loss. The patient is nervous/anxious and has insomnia (Hard time falling asleep and then getting back to sleep).        Feels sad and hopeless at times.  No suicidal ideation.  Does not want to be a "burden"    I have reviewed and (if needed) personally updated the patient's problem list, medications, allergies, past medical and surgical history, social and family history.   PAST MEDICAL HISTORY   Past Medical History:  Diagnosis Date  . Allergy   . Anemia   . Anxiety   . Arthritis   . Asthma   . Benign positional vertigo   . Blood transfusion without reported diagnosis   . Cardiac pacemaker in situ 05/01/2015   for syncope & Sx Bradycardia -> Medtronic MRI compatible pacemaker placed Dr. Caryl Comes   . Cataract   . Chronic diarrhea   . Chronic kidney disease, stage IV (severe) ([place])   . Colon polyps   . Diastolic dysfunction, left ventricle  11/03/2013  . Diverticulosis   . Esophageal stricture   . GERD (gastroesophageal reflux disease)   . Gout   . Heart murmur   . Hemorrhoids   . Hyperlipidemia   . Hypertensive heart disease    no significant RAS by MRA 2002- (<30% LRAS)   . Hypothyroidism   . IBS (irritable bowel syndrome)   . LBBB (left bundle branch block)- new 11/01/13 11/01/2013  . Paroxysmal atrial fibrillation 11/01/2013   Intermittent through the years and recurrent in December of 2014 treated with amiodarone;; Negative Myoview 10/2013  . Peripheral vascular disease    123456 LICA, [id number] RICA by doppler 2009   . Sinus arrest 04/28/2015  . Syncope and collapse 04/28/2015   s/p PPM  . Tubulovillous adenoma 4/07    PAST SURGICAL HISTORY   Past Surgical History:  Procedure Laterality Date  . APPENDECTOMY    . CATARACT EXTRACTION    . EP IMPLANTABLE DEVICE N/A 05/01/2015   Procedure: Pacemaker Implant;  Surgeon: Deboraha Sprang, MD;  Location: [facility];  Service: Cardiovascular;  Laterality: N/A;  . HIP ARTHROPLASTY Left 05/01/2015   Procedure: ARTHROPLASTY BIPOLAR HIP (HEMIARTHROPLASTY);  Surgeon: Rod Can, MD;  Location: [facility];  Service: Orthopedics;  Laterality: Left;  . LOW ANTERIOR BOWEL RESECTION  7/08  . [place] SURGERY  2000  . NM MYOVIEW LTD  03/18/2013   Negative for ischemia or infarction. EF 55%.  LOW RISK  . OOPHORECTOMY  1962   right  . squamous cell skin cancer removed    . TRANSTHORACIC ECHOCARDIOGRAM  10/2013, 02/2017   A) EF 55-60%, mild LVH, elevated bili pressures. Mild aortic valve calcification;; B) Normal LV size and function with mild LVH. Unable to assess diastolic function. Normal PA pressures. Mild MR,Mild AI    Immunization History  Administered Date(s) Administered  . Fluad Quad(high Dose 65+) 08/31/2020  . Influenza Split 09/09/2011, 08/04/2012  . Influenza Whole 08/30/2009, 09/06/2010  . Influenza, High Dose Seasonal PF 08/21/2016, 09/01/2017, 09/01/2018   . Influenza,inj,Quad PF,6+ Mos 08/31/2013, 09/07/2014  . Influenza-Unspecified 09/14/2015  . PFIZER(Purple Top)SARS-COV-2 Vaccination 11/30/2019, 12/21/2019, 12/05/2020  . Pneumococcal Polysaccharide-23 09/01/2008  . Tdap 04/28/2015, 01/11/2017  . Zoster 09/17/2012    MEDICATIONS/ALLERGIES   Current Meds  Medication Sig  . acetaminophen (TYLENOL) 325 MG tablet Take 2 tablets (650 mg total) by mouth every 6 (six) hours as needed for mild pain (or Fever >/= 101).  Marland Kitchen amLODipine (NORVASC) 2.5 MG tablet Take  1 tablet (2.5 mg total) by mouth daily.  Marland Kitchen apixaban (ELIQUIS) 2.5 MG TABS tablet Take 1 tablet (2.5 mg total) by mouth 2 (two) times daily.  . carvedilol (COREG) 6.25 MG tablet Take 1 tablet (6.25 mg total) by mouth 2 (two) times daily.  . furosemide (LASIX) 20 MG tablet Take 1 tablet (20 mg total) by mouth daily.  . hydrALAZINE (APRESOLINE) 100 MG tablet Take 1 tablet (100 mg) in the AM and 1 tablet (100 mg) in the PM.  . hydrALAZINE (APRESOLINE) 25 MG tablet Take 75 mg (3 tablets) daily at noon-3pm  . hyoscyamine (LEVSIN) 0.125 MG/5ML ELIX Take by mouth as needed.  Marland Kitchen levothyroxine (SYNTHROID) 88 MCG tablet TAKE 1 TABLET DAILY BEFORE BREAKFAST  . nitroGLYCERIN (NITROSTAT) 0.4 MG SL tablet Place 1 tablet (0.4 mg total) under the tongue every 5 (five) minutes x 3 doses as needed for chest pain.  Marland Kitchen omeprazole (PRILOSEC) 40 MG capsule Take 1 capsule (40 mg total) by mouth 2 (two) times daily. NEEDS APPT IN APRIL  . potassium chloride (KLOR-CON 10) 10 MEQ tablet Take 1 tablet (10 mEq total) by mouth daily.  . predniSONE (DELTASONE) 5 MG tablet Take 1 tablet (5 mg total) by mouth daily with breakfast.  . sertraline (ZOLOFT) 50 MG tablet Take 1 tablet (50 mg total) by mouth daily.  . temazepam (RESTORIL) 15 MG capsule TAKE 2 CAPSULES BY MOUTH AT BEDTIME AS NEEDED FOR SLEEP    Allergies  Allergen Reactions  . Penicillins Swelling    REACTION: nausea, swelling  . Xifaxan [Rifaximin] Other  (See Comments) and Rash    unknown  . Ambien [Zolpidem Tartrate]     " made her crazy "  . Amoxicillin     REACTION: unspecified  . Ceftin [Cefuroxime Axetil] Diarrhea  . Codeine Phosphate     REACTION: unspecified  . Colchicine     Severe diarrhea  . Levofloxacin Other (See Comments)    Tingle numbness  . Lipitor [Atorvastatin] Other (See Comments)    "makes legs jump all night"    SOCIAL HISTORY/FAMILY HISTORY   Reviewed in Epic:  Pertinent findings:  Social History   Tobacco Use  . Smoking status: Never Smoker  . Smokeless tobacco: Never Used  . Tobacco comment: never used tobacco  Vaping Use  . Vaping Use: Never used  Substance Use Topics  . Alcohol use: No    Alcohol/week: 0.0 standard drinks  . Drug use: No   Social History   Social History Narrative   She is a widowed mother of 2, one child is deceased. Grandmother of one. She appears to be working out routinely at [employer] using [employer]. She is otherwise quite active.   She does not drink alcohol, does not smoke.  She is retired from [employer].       She lives alone in her condo.  Currently now living with her son and daughter-in-law.  Had recently been on hospice due to failure to thrive secondary to significant back pain.  Pain significantly improved with the steroids, and she has perked up some with notable weight gain, but still remains very tenuous.  Not likely able to go back to living alone.  Having a difficult time dealing with ramifications of her limited mobility and requirement for significant pain medications.      Very emotionally stressful.  She acknowledges that this is not how she would want to live the rest of her life.  OBJCTIVE -PE, EKG, labs   Wt Readings from Last 3 Encounters:  02/01/21 102 lb 3.2 oz (46.4 kg)  11/22/20 116 lb 12.8 oz (53 kg)  11/14/20 118 lb (53.5 kg)    Physical Exam: BP 121/72   Pulse 66   Ht '5\' 2"'$  (1.575 m)   Wt 102 lb 3.2 oz  (46.4 kg)   SpO2 97%   BMI 18.69 kg/m  Physical Exam Constitutional:      General: She is not in acute distress.    Appearance: Normal appearance.     Comments: Thin, frail elderly woman.  Still well-groomed.  HENT:     Head: Normocephalic and atraumatic.     Comments: Mild temporal wasting Eyes:     Comments: Irregular shaped pupils  Neck:     Vascular: No carotid bruit (Radiated AoV murmur).  Cardiovascular:     Rate and Rhythm: Normal rate and regular rhythm.  No extrasystoles are present.    Pulses: Normal pulses.     Heart sounds: S1 normal. Murmur heard.  High-pitched harsh crescendo-decrescendo midsystolic murmur is present with a grade of 2/6 at the upper right sternal border radiating to the neck. No friction rub. No S4 sounds.      Comments: Split S2 Pulmonary:     Effort: Pulmonary effort is normal. No respiratory distress.     Breath sounds: Normal breath sounds.  Chest:     Chest wall: No tenderness.  Musculoskeletal:        General: Swelling (1+ bilateral LE) present.     Cervical back: Normal range of motion and neck supple.  Skin:    General: Skin is warm and dry.  Neurological:     General: No focal deficit present.     Mental Status: She is alert and oriented to person, place, and time.     Gait: Gait abnormal.  Psychiatric:        Judgment: Judgment normal.     Comments: Somewhat depressed mood.  Not anxious.  Decent insight     Adult ECG Report  Rate: 66 ;  Rhythm: Ventricular paced rhythm.;   Narrative Interpretation: Stable  Recent Labs: Reviewed; followed by PCP.  Lab Results  Component Value Date   CREATININE 1.31 (H) 10/25/2020   BUN 25 10/25/2020   NA 142 10/25/2020   K 4.2 10/25/2020   CL 97 10/25/2020   CO2 29 10/25/2020   CBC Latest Ref Rng & Units 05/30/2020 05/26/2020 03/27/2020  WBC 4.0 - 10.5 K/uL 12.8(H) 5.4 7.2  Hemoglobin 12.0 - 15.0 g/dL 11.3(L) 11.8(L) 10.8(L)  Hematocrit 36.0 - 46.0 % 35.2(L) 36.9 34.7(L)  Platelets 150  - 400 K/uL 255 288 152    Lab Results  Component Value Date   TSH 1.10 03/21/2020    ==================================================  COVID-19 Education: The signs and symptoms of COVID-19 were discussed with the patient and how to seek care for testing (follow up with PCP or arrange E-visit).   The importance of social distancing and COVID-19 vaccination was discussed today. The patient is practicing social distancing & Masking.   I spent a total of 55mnutes with the patient spent in direct patient consultation.  Additional time spent with chart review  / charting (studies, outside notes, etc): 264m Total Time: 41 min   Current medicines are reviewed at length with the patient today.  (+/- concerns) n/a  This visit occurred during the SARS-CoV-2 public health emergency.  Safety protocols were in place, including screening questions prior  to the visit, additional usage of staff PPE, and extensive cleaning of exam room while observing appropriate contact time as indicated for disinfecting solutions.  Notice: This dictation was prepared with Dragon dictation along with smaller phrase technology. Any transcriptional errors that result from this process are unintentional and may not be corrected upon review.  Patient Instructions / Medication Changes & Studies & Tests Ordered   Patient Instructions  Medication Instructions:     check your blood pressure if you fell dizzy or woozy  If blood pressure is less than 123456 - systolic  ( top number ) do not take your next dose of Hydralazine   No other changes    *If you need a refill on your cardiac medications before your next appointment, please call your pharmacy*   Lab Work: Not needed   I   Testing/Procedures: Not needed   Follow-Up: At [facility], you and your health needs are our priority.  As part of our continuing mission to provide you with exceptional heart care, we have created designated Provider Care  Teams.  These Care Teams include your primary Cardiologist (physician) and Advanced Practice Providers (APPs -  Physician Assistants and Nurse Practitioners) who all work together to provide you with the care you need, when you need it.     Your next appointment:   6 month(s)  The format for your next appointment:   In Person  Provider:   Glenetta Hew, MD   Other Instructions   You are on See Food diet - if you see it you can eat it!!!    Studies Ordered:   Orders Placed This Encounter  Procedures  . EKG 12-Lead     Glenetta Hew, M.D., M.S. Interventional Cardiologist   Pager # [phone] Phone # [phone] [address] [place], [place] [address]   Thank you for choosing Heartcare at [facility]!!

## 2021-02-01 NOTE — Patient Instructions (Signed)
Medication Instructions:     check your blood pressure if you fell dizzy or woozy  If blood pressure is less than 123456 - systolic  ( top number ) do not take your next dose of Hydralazine   No other changes    *If you need a refill on your cardiac medications before your next appointment, please call your pharmacy*   Lab Work: Not needed   I   Testing/Procedures: Not needed   Follow-Up: At [facility], you and your health needs are our priority.  As part of our continuing mission to provide you with exceptional heart care, we have created designated Provider Care Teams.  These Care Teams include your primary Cardiologist (physician) and Advanced Practice Providers (APPs -  Physician Assistants and Nurse Practitioners) who all work together to provide you with the care you need, when you need it.     Your next appointment:   6 month(s)  The format for your next appointment:   In Person  Provider:   Glenetta Hew, MD   Other Instructions   You are on See Food diet - if you see it you can eat it!!!

## 2021-02-02 ENCOUNTER — Telehealth: Payer: Self-pay | Admitting: Family Medicine

## 2021-02-02 NOTE — Telephone Encounter (Signed)
Patient had refills on file, called  [facility] on [address], will fill prescription for patient, and notify via text.

## 2021-02-02 NOTE — Telephone Encounter (Signed)
Patient is daughter is calling and requesting a refill for temazepam (RESTORIL) 15 MG capsule to be sent to [facility] on [address], please. CB is [phone]

## 2021-02-19 ENCOUNTER — Encounter: Payer: Self-pay | Admitting: Cardiology

## 2021-02-19 NOTE — Assessment & Plan Note (Signed)
She needs protein.  He is to eat more.  She is to supplement her diet with protein shakes.  Can add protein powder to smoothies, can also try to eat more eggs and drink more milk.  Since she cannot eat large amounts, she does eat more frequently, smaller meals.

## 2021-02-19 NOTE — Assessment & Plan Note (Signed)
Seems to be rate controlled.  Has no sensation of being in A. fib.  She has knee pains right now.  Current dose of carvedilol. No bleeding issues on apixaban.  Continue.

## 2021-02-19 NOTE — Assessment & Plan Note (Signed)
Avoid excess blood pressure control.  Hence the reason for having your home dose of hydralazine for blood pressure is low.  If she feels dizzy, she will check her blood pressure, if well, she will hold the dose hydralazine.  I also encouraged her to drink adequate amount of hydration-trying to achieve 80 ounces a day at a minimum.

## 2021-02-19 NOTE — Assessment & Plan Note (Signed)
Stable creatinine.  Stable blood pressures.  She is actually on a stable regimen right now with low-dose of amlodipine, along with moderate dose carvedilol. She is on hydralazine (we need to confirm whether she is taking 100 mg or 75 mg twice daily)   For PRN dosing: She can take an additional dose of hydralazine as needed for systolic pressures sustained greater than 170 mmHg.  If blood pressure less than 120 mmHg, she should hold a dose of hydralazine.

## 2021-03-13 NOTE — Telephone Encounter (Signed)
See phone note

## 2021-03-19 ENCOUNTER — Ambulatory Visit (INDEPENDENT_AMBULATORY_CARE_PROVIDER_SITE_OTHER): Payer: Medicare PPO

## 2021-03-19 ENCOUNTER — Other Ambulatory Visit: Payer: Self-pay | Admitting: Gastroenterology

## 2021-03-19 DIAGNOSIS — I442 Atrioventricular block, complete: Secondary | ICD-10-CM

## 2021-03-21 LAB — CUP PACEART REMOTE DEVICE CHECK
Battery Remaining Longevity: 51 mo
Battery Voltage: 2.98 V
Brady Statistic AP VP Percent: 0 %
Brady Statistic AP VS Percent: 0 %
Brady Statistic AS VP Percent: 100 %
Brady Statistic AS VS Percent: 0 %
Brady Statistic RA Percent Paced: 0 %
Brady Statistic RV Percent Paced: 97.63 %
Date Time Interrogation Session: 20220510185724
Implantable Lead Implant Date: 20160620
Implantable Lead Implant Date: 20160620
Implantable Lead Location: 753859
Implantable Lead Location: 753860
Implantable Lead Model: 5076
Implantable Lead Model: 5076
Implantable Pulse Generator Implant Date: 20160620
Lead Channel Impedance Value: 285 Ohm
Lead Channel Impedance Value: 342 Ohm
Lead Channel Impedance Value: 342 Ohm
Lead Channel Impedance Value: 418 Ohm
Lead Channel Pacing Threshold Amplitude: 0.75 V
Lead Channel Pacing Threshold Amplitude: 0.875 V
Lead Channel Pacing Threshold Pulse Width: 0.4 ms
Lead Channel Pacing Threshold Pulse Width: 0.4 ms
Lead Channel Sensing Intrinsic Amplitude: 0.375 mV
Lead Channel Sensing Intrinsic Amplitude: 0.75 mV
Lead Channel Sensing Intrinsic Amplitude: 9 mV
Lead Channel Sensing Intrinsic Amplitude: 9 mV
Lead Channel Setting Pacing Amplitude: 2 V
Lead Channel Setting Pacing Pulse Width: 0.4 ms
Lead Channel Setting Sensing Sensitivity: 4 mV

## 2021-03-23 ENCOUNTER — Other Ambulatory Visit: Payer: Self-pay

## 2021-03-23 NOTE — Patient Outreach (Signed)
[name]) Care Management  03/23/2021  [name] 01-30-1924 [id number]   Telephone Screen  Referral Date: 03/21/2021 Referral Source: High Risk Report Insurance: [facility]   Outreach attempt #1 to patient. Spoke with daughter in law-Donna([name] on file) she voices that she handles patient's affairs.  Social: Patient lives alone. She remains highly functional and independent despite her age. She is independent with ADLs and most IADLs. Patient continues to drive. Last fall was in Feb. She has DME in the home home but doe not like to use it. Patient has life alert device as well. Family lives nearby and checks on patient multiple times throughout the day.  Conditions: Per chart review, patient has PMH that includes but not limited to A-fib, CKD, hypertensive heart disease  and COPD. Caregiver states she has noticed increased episodes of patient forgetting things and memory issues. She has not seen PCP since earlier this year. RN CM encouraged caregiver to make follow up appt with PCP to discuss mental changes.    Medications: Caregiver stets that she fills med planner for patient weekly.She has noticed that patient forgets to take meds as times.   Medications Reviewed Today    Reviewed by Hayden Pedro, RN (Registered Nurse) on 03/23/21 at 1131  Med List Status: <None>  Medication Order Taking? Sig Documenting Provider Last Dose Status Informant  acetaminophen (TYLENOL) 325 MG tablet [id number] No Take 2 tablets (650 mg total) by mouth every 6 (six) hours as needed for mild pain (or Fever >/= 101). Debbe Odea, MD Taking Active Family Member  amLODipine (NORVASC) 2.5 MG tablet [id number] No Take 1 tablet (2.5 mg total) by mouth daily. [name], MD Taking Expired 03/15/21 2359   apixaban (ELIQUIS) 2.5 MG TABS tablet [id number] No Take 1 tablet (2.5 mg total) by mouth 2 (two) times daily. [name], MD Taking Active   carvedilol (COREG)  6.25 MG tablet [id number] No Take 1 tablet (6.25 mg total) by mouth 2 (two) times daily. [name], MD Taking Active   furosemide (LASIX) 20 MG tablet [id number] No Take 1 tablet (20 mg total) by mouth daily. [name], MD Taking Active   hydrALAZINE (APRESOLINE) 100 MG tablet [id number] No Take 1 tablet (100 mg) in the AM and 1 tablet (100 mg) in the PM. [name], MD Taking Active   hydrALAZINE (APRESOLINE) 25 MG tablet [id number] No Take 75 mg (3 tablets) daily at noon-3pm [name], MD Taking Active   hyoscyamine (LEVSIN) 0.125 MG/5ML Karen Kays [id number] No Take by mouth as needed. [provider] Taking Active   levothyroxine (SYNTHROID) 88 MCG tablet [id number] No TAKE 1 TABLET DAILY BEFORE BREAKFAST [name], MD Taking Active   nitroGLYCERIN (NITROSTAT) 0.4 MG SL tablet [id number] No Place 1 tablet (0.4 mg total) under the tongue every 5 (five) minutes x 3 doses as needed for chest pain. [name], MD Taking Active   omeprazole (PRILOSEC) 40 MG capsule [id number] No Take 1 capsule (40 mg total) by mouth 2 (two) times daily. NEEDS APPT IN Angelena Form, MD Taking Active Family Member  potassium chloride (KLOR-CON 10) 10 MEQ tablet [id number] No Take 1 tablet (10 mEq total) by mouth daily. [name], MD Taking Active   predniSONE (DELTASONE) 5 MG tablet [id number] No Take 1 tablet (5 mg total) by mouth daily with breakfast. [name], MD Taking Active   sertraline (ZOLOFT) 50 MG tablet [id number] No  Take 1 tablet (50 mg total) by mouth daily. [name], MD Taking Active   temazepam (RESTORIL) 15 MG capsule [id number] No TAKE 2 CAPSULES BY MOUTH AT BEDTIME AS NEEDED FOR SLEEP [name], MD Taking Active            Fall Risk  03/23/2021 08/11/2018 03/02/2014 12/29/2013  Falls in the past year? 1 No No No  Number falls in past yr: 1 - - -  Injury with Fall? 0 - - -  Risk for fall due to : History of fall(s);Impaired  balance/gait;Medication side effect - - Impaired vision  Follow up Falls evaluation completed;Education provided - - -   Depression screen [facility] 2/9 03/23/2021 08/11/2018  Decreased Interest 0 0  Down, Depressed, Hopeless 0 0  PHQ - 2 Score 0 0  Some recent data might be hidden   SDOH Screenings   Alcohol Screen: Not on file  Depression (PHQ2-9): Low Risk   . PHQ-2 Score: 0  Financial Resource Strain: Not on file  Food Insecurity: No Food Insecurity  . Worried About Charity fundraiser in the Last Year: Never true  . Ran Out of Food in the Last Year: Never true  Housing: Not on file  Physical Activity: Not on file  Social Connections: Not on file  Stress: Not on file  Tobacco Use: Low Risk   . Smoking Tobacco Use: Never Smoker  . Smokeless Tobacco Use: Never Used  Transportation Needs: No Transportation Needs  . Lack of Transportation (Medical): No  . Lack of Transportation (Non-Medical): No   Goals Addressed              This Visit's Progress   .  (THN)Absence of Fall and Fall-Related Injury (pt-stated)           Timeframe:  Long-Range Goal Priority:  High Start Date:  03/23/2021                           Expected End Date:  10/10/2021 Follow Up Date; August 2022   Barriers: Health Behaviors Knowledge    -pt will use DME inside/outside of the home -pt will utilize life alert device if needed -pt will remain safe in the home alone                      Evidence-based guidance:   Assess fall risk using a validated tool when available. Consider balance and gait impairment, muscle weakness, diminished vision or hearing, environmental hazards, presence of urinary or bowel urgency and/or incontinence.   Communicate fall injury risk to interprofessional healthcare team.   Develop a fall prevention plan with the patient and family.   Promote use of personal vision and auditory aids.   Promote reorientation, appropriate sensory stimulation, and routines to decrease  risk of fall when changes in mental status are present.   Assess assistance level required for safe and effective self-care; consider referral for home care.   Encourage physical activity, such as performance of self-care at highest level of ability, strength and balance exercise program, and provision of appropriate assistive devices; refer to rehabilitation therapy.   Refer to community-based fall prevention program where available.   If fall occurs, determine the cause and revise fall injury prevention plan.   Regularly review medication contribution to fall risk; consider risk related to polypharmacy and age.   Refer to pharmacist for consultation when concerns about medications are revealed.  Balance adequate pain management with potential for oversedation.   Provide guidance related to environmental modifications.   Consider supplementation with Vitamin D.   Notes:   03/23/2021- Caregiver reports last fall was in Feb. Patient lives alone and does fairly well on her own.     .  (THN)Track and Manage Heart Rate and Rhythm-Atrial Fibrillation (pt-stated)        Timeframe:  Long-Range Goal Priority:  High Start Date: 03/23/2021                            Expected End Date: 10/10/2021                      Follow Up Date August 2022    Barriers: Health Behaviors Knowledge  - keep all lab appointments - take medicine as prescribed    Why is this important?    Atrial fibrillation may have no symptoms. Sometimes the symptoms get worse or happen more often.   It is important to keep track of what your symptoms are and when they happen.   A change in symptoms is important to discuss with your doctor or nurse.   Being active and healthy eating will also help you manage your heart condition.     Notes:  03/23/2021-Caregiver reports that pt has had no recent cardiac issues. She sees cardiologist routinely.         Consent: [facility] services reviewed and discussed with  caregiver. Verbal consent for services given.    Plan: RN CM discussed with caregiver next outreach within the month of August. Caregiver gave verbal consent and in agreement with RN CM follow up and timeframe. Caregiver aware that they may contact RN CM sooner for any issues or concerns. RN CM reviewed goals and plan of care with caregiver. Caregiver agrees to care plan and follow up. RN CM will send barriers letter and route encounter to PCP. RN CM will send welcome letter to patient/caregiver.   Enzo Montgomery, RN,BSN,CCM [facility] Telephonic Care Management Coordinator Direct Phone: [phone] Toll Free: [phone] Fax: [phone]

## 2021-04-03 ENCOUNTER — Ambulatory Visit: Payer: Medicare PPO

## 2021-04-03 DIAGNOSIS — L720 Epidermal cyst: Secondary | ICD-10-CM | POA: Diagnosis not present

## 2021-04-03 DIAGNOSIS — L820 Inflamed seborrheic keratosis: Secondary | ICD-10-CM | POA: Diagnosis not present

## 2021-04-03 DIAGNOSIS — D485 Neoplasm of uncertain behavior of skin: Secondary | ICD-10-CM | POA: Diagnosis not present

## 2021-04-03 DIAGNOSIS — D0462 Carcinoma in situ of skin of left upper limb, including shoulder: Secondary | ICD-10-CM | POA: Diagnosis not present

## 2021-04-03 DIAGNOSIS — Z85828 Personal history of other malignant neoplasm of skin: Secondary | ICD-10-CM | POA: Diagnosis not present

## 2021-04-06 NOTE — Progress Notes (Signed)
Remote pacemaker transmission.   

## 2021-04-11 ENCOUNTER — Other Ambulatory Visit: Payer: Self-pay

## 2021-04-11 ENCOUNTER — Encounter: Payer: Self-pay | Admitting: Family Medicine

## 2021-04-11 ENCOUNTER — Ambulatory Visit: Payer: Medicare PPO | Admitting: Family Medicine

## 2021-04-11 VITALS — BP 120/78 | HR 80 | Temp 97.9°F | Wt 110.2 lb

## 2021-04-11 DIAGNOSIS — G3184 Mild cognitive impairment, so stated: Secondary | ICD-10-CM

## 2021-04-11 DIAGNOSIS — F418 Other specified anxiety disorders: Secondary | ICD-10-CM

## 2021-04-11 MED ORDER — SERTRALINE HCL 100 MG PO TABS: 100.0000 mg | ORAL_TABLET | Freq: Every day | ORAL | 3 refills | Status: DC

## 2021-04-11 NOTE — Progress Notes (Signed)
   Subjective:    Patient ID: [name], female    DOB: 02-08-1924, 87 y.o.   MRN: [id number]  HPI Here with her daughter to follow up on depression and also to address her daughter's concerns about her mental capacity. We saw [name] in January we started her on Zoloft 50 mg daily for depression. She lives alone and has almost no social interactions except with her family. Both of them think the Zoloft has helped, but she clearly still deals with a lot of sadness and depression symptoms. She sometimes has trouble sleeping. Her daughter also thinks [name] has declined significantly from a cognitive standpoint and her memory has faded quite a bit. She still drives her own car, but the family is very concerned about this. Her daughter asked if we can give [name] a "test" for dementia, etc.    Review of Systems  Constitutional: Negative.   Respiratory: Negative.   Cardiovascular: Negative.   Neurological: Negative.   Psychiatric/Behavioral: Positive for decreased concentration, dysphoric mood and sleep disturbance. Negative for agitation, behavioral problems, confusion, hallucinations, self-injury and suicidal ideas. The patient is nervous/anxious.        Objective:   Physical Exam Constitutional:      Appearance: Normal appearance.  Cardiovascular:     Rate and Rhythm: Normal rate and regular rhythm.     Pulses: Normal pulses.     Heart sounds: Normal heart sounds.  Pulmonary:     Effort: Pulmonary effort is normal.     Breath sounds: Normal breath sounds.  Neurological:     Mental Status: She is alert.     Motor: No weakness.     Coordination: Coordination normal.     Gait: Gait normal.  Psychiatric:        Mood and Affect: Mood normal.        Behavior: Behavior normal.     Comments: We performed a MMSE and she did quite well, scoring 27 out of 30 (missing only the year, the month, and the date)           Assessment & Plan:  She is still dealing with depression, so we  will increase the Zoloft to 100 mg daily. We spent 50 minutes discussing depression and its effects on cognition. She does not seem to meet the criteria for dementia at this point, and I think a better diagnosis is Mild Cognitive Impairment. We agreed she can still drive short distances (only during the daylight), but that at some point she will need to stop driving. We will meet again in 4 weeks to follow up on these issues.  [name], MD

## 2021-05-12 ENCOUNTER — Other Ambulatory Visit: Payer: Self-pay | Admitting: Cardiology

## 2021-05-12 DIAGNOSIS — I110 Hypertensive heart disease with heart failure: Secondary | ICD-10-CM

## 2021-05-30 ENCOUNTER — Telehealth: Payer: Self-pay

## 2021-05-30 NOTE — Telephone Encounter (Signed)
Called Butch Penny, (okay per [name]) LVM to call back to discuss high blood pressures of patient. Left call back number.

## 2021-05-30 NOTE — Telephone Encounter (Signed)
    [name] retuning call, she said pt is feeling ok but her BP is at 210/106, pt has not taken her meds yet and pt is getting ready to take her meds right now.

## 2021-05-30 NOTE — Telephone Encounter (Signed)
  MyChart message   [place], [place]  You 22 hours ago (7:46 PM)      [name] has not been feeling well for 3 to 4 weeks now. She complains of being off balance. She as also had some bouts of being light headed.     Her BP readings  7/11 - 169/77 HR 89 7/12 - 195/100 HR 88 7/14 - 173/82 HR 87 7/17 - 186/86 HR 93 7/18 - 222/106 HR 86 10:30am 7/18 - 207/102 HR 93 2:30pm 7/18- 185/93 HR 98 8:55pm 7/19 - 203/113 HR 91 10:15am   Do we need to come in and see you?  She is not taking the 75 MG at the middle of day (stopped this in May)   Please call me [name] at [phone].  Thanks  MY RESPONSE.  I think she probably feels better when her blood pressures are not very low, I do agree that the 200s a little bit concerning.  As we have discussed in the past, she has hydralazine that she should take if her blood pressure is up.  The 50 or 75 mg dose of hydralazine would be what she should take if her blood pressure is high meaning over 180 mmHg.   I do not think she needs to come see me personally, I think we can just adjust the medicines.   What she needs to do is recheck blood pressure an hour after she has a high recording if it is still elevated take the extra dose of hydralazine   Glenetta Hew, MD

## 2021-05-30 NOTE — Telephone Encounter (Signed)
Called daughter in law back, unable to reach her to discuss- left call back number.

## 2021-05-30 NOTE — Telephone Encounter (Signed)
FYI :  From my last note -- explained to the patient & family:  Hypertensive heart and chronic kidney disease with chronic diastolic congestive heart failure ([place]) - Primary (Chronic) Stable creatinine.  Stable blood pressures.  She is actually on a stable regimen right now with low-dose of amlodipine, along with moderate dose carvedilol. She is on hydralazine (we need to confirm whether she is taking 100 mg or 75 mg twice daily)   For PRN dosing: She can take an additional dose of hydralazine as needed for systolic pressures sustained greater than 170 mmHg.   If blood pressure less than 120 mmHg, she should hold a dose of hydralazine.   She has a plan that is supposed to be in place for high BP.  Her BP is very labile - so the plan is - recheck in 30 min  to 1 hr - if still high use PRN dose of Hydralazine 50 mg daily.  Glenetta Hew, MD

## 2021-05-30 NOTE — Telephone Encounter (Signed)
Daughter in law called back- stating that her mother in [place] BP has been elevated. She states that today at 11:30 it was 210/106, she was going to take her medications and recheck. When I asked for recent BP reading she was no longer with her and would call to have her check it. She states that she has not been acting like herself, and has been forgetting to take her afternoon dose of hydralazine. She advised she would mychart [place] with the updated blood pressure and if it was still high in the 200/100 range and she was still not acting like herself with dizziness and lightheadedness I recommend she go to ED. Daughter in law verbalized understanding.  Will route to MD to make aware and will keep updated.

## 2021-05-31 NOTE — Telephone Encounter (Signed)
Noted. Thank you!  They were going to call with an update on BP.

## 2021-06-05 ENCOUNTER — Encounter: Payer: Self-pay | Admitting: Family

## 2021-06-12 ENCOUNTER — Other Ambulatory Visit: Payer: Self-pay

## 2021-06-12 NOTE — Patient Outreach (Signed)
[facility]) Care Management  06/12/2021  MAREYA RANK 22-Sep-1924 [id number]   Telephone Assessment    Successful outreach call placed to [name] in law. She shares that patient has not been doing well for several weeks now. She has been staying with them as its not safe for patient to reside alone in her current condition. Caregiver reports decreased appetite, patient sleeping more and memory worsening. She also voices that patient has strong odor and dark urine.They have been having ongoing issues with elevated BP. Cardiologist office aware and BP meds have been adjusted but no relief. Patient has no upcoming MD appts. Patient's BP this morning was 227/106. Patient currently resting and not complaining at present.She states she has been checking BP almost hourly and BP has not come down. Caregiver voices she is awaiting call from cardiologist office on what to do. RN CM spoke at length regarding concerns for patient condition given sxs described and possible causes and dangers if left untreated. Strongly encouraged them to seek medical attention ASAP. Caregiver states they did not want to go to hospital ER due to extended wait time. However, she was willing to consider going to [facility]-near their home). She denies any RN CM needs or concerns at this time.   Goals Addressed               This Visit's Progress     (THN)Absence of Fall and Fall-Related Injury (pt-stated)           Timeframe:  Long-Range Goal Priority:  High Start Date:  03/23/2021                           Expected End Date:  10/10/2021 Follow Up Date; Sept 2022   Barriers: Health Behaviors Knowledge    -pt will use DME inside/outside of the home -pt will utilize life alert device if needed -pt will remain safe in the home alone                      Evidence-based guidance:  Assess fall risk using a validated tool when available. Consider balance and gait  impairment, muscle weakness, diminished vision or hearing, environmental hazards, presence of urinary or bowel urgency and/or incontinence.  Communicate fall injury risk to interprofessional healthcare team.  Develop a fall prevention plan with the patient and family.  Promote use of personal vision and auditory aids.  Promote reorientation, appropriate sensory stimulation, and routines to decrease risk of fall when changes in mental status are present.  Assess assistance level required for safe and effective self-care; consider referral for home care.  Encourage physical activity, such as performance of self-care at highest level of ability, strength and balance exercise program, and provision of appropriate assistive devices; refer to rehabilitation therapy.  Refer to community-based fall prevention program where available.  If fall occurs, determine the cause and revise fall injury prevention plan.  Regularly review medication contribution to fall risk; consider risk related to polypharmacy and age.  Refer to pharmacist for consultation when concerns about medications are revealed.  Balance adequate pain management with potential for oversedation.  Provide guidance related to environmental modifications.  Consider supplementation with Vitamin D.   Notes:   03/23/2021- Caregiver reports last fall was in Feb. Patient lives alone and does fairly well on her own.   06/12/21-Did not address this call-patient with acute issues.      (THN)Track and Manage  Heart Rate and Rhythm-Atrial Fibrillation (pt-stated)        Timeframe:  Long-Range Goal Priority:  High Start Date: 03/23/2021                            Expected End Date: 10/10/2021                      Follow Up Date August 2022    Barriers: Health Behaviors Knowledge  - keep all lab appointments - take medicine as prescribed    Why is this important?   Atrial fibrillation may have no symptoms. Sometimes the symptoms get worse or  happen more often.  It is important to keep track of what your symptoms are and when they happen.  A change in symptoms is important to discuss with your doctor or nurse.  Being active and healthy eating will also help you manage your heart condition.     Notes:  03/23/2021-Caregiver reports that pt has had no recent cardiac issues. She sees cardiologist routinely.   06/12/21-Caregiver monitoring BP and HR. HR has been WNL but BP abnormally elevated.      (THN)Track and Manage My Blood Pressure-Hypertension (pt-stated)        Timeframe:  Long-Range Goal Priority:  High Start Date:   06/12/2021                          Expected End Date: Aug 2022                      Follow Up Date Aug 2022  Barriers: Health Behaviors Knowledge    - check blood pressure daily - write blood pressure results in a log or diary -notify MD of abnormal/elevated BP readings    Why is this important?   You won't feel high blood pressure, but it can still hurt your blood vessels.  High blood pressure can cause heart or kidney problems. It can also cause a stroke.  Making lifestyle changes like losing a little weight or eating less salt will help.  Checking your blood pressure at home and at different times of the day can help to control blood pressure.  If the doctor prescribes medicine remember to take it the way the doctor ordered.  Call the office if you cannot afford the medicine or if there are questions about it.     Notes:  06/12/21-Patient experiencing elevated BP for past several days/weeks now. She has not been seen by MD but MD office aware. Patient also having other concerning acute sxs/issues and advised to seek medical attention.         Plan: RN CM will route encounter to MD for update on patient's status. RN CM discussed with caregiver next outreach within two weeks. Caregiver gave verbal consent and in agreement with RN CM follow up and timeframe. Caregiver aware that they may contact RN CM  sooner for any issues or concerns. RN CM reviewed goals and plan of care with caregiver. Caregiver agrees to care plan and follow up.  Enzo Montgomery, RN,BSN,CCM [place] [facility] Telephonic Care Management Coordinator Direct Phone: [phone] Toll Free: [phone] Fax: [phone]

## 2021-06-13 NOTE — Progress Notes (Signed)
I think I responded with blood pressure recordings.  For the last several times that have seen Jana Half in clinic, I have stated what they need to do as needed for elevated blood pressures that are sustained.   Glenetta Hew, MD

## 2021-06-14 ENCOUNTER — Other Ambulatory Visit: Payer: Self-pay | Admitting: Family Medicine

## 2021-06-15 ENCOUNTER — Ambulatory Visit: Payer: Self-pay

## 2021-06-15 NOTE — Telephone Encounter (Signed)
Last ov-04/11/21 Last refill- 11/14/20--60 cap, 5 refills  No future ov scheduled.

## 2021-06-18 ENCOUNTER — Ambulatory Visit (INDEPENDENT_AMBULATORY_CARE_PROVIDER_SITE_OTHER): Payer: Medicare PPO

## 2021-06-18 DIAGNOSIS — I442 Atrioventricular block, complete: Secondary | ICD-10-CM | POA: Diagnosis not present

## 2021-06-20 LAB — CUP PACEART REMOTE DEVICE CHECK
Battery Remaining Longevity: 44 mo
Battery Voltage: 2.97 V
Brady Statistic AP VP Percent: 0 %
Brady Statistic AP VS Percent: 0 %
Brady Statistic AS VP Percent: 100 %
Brady Statistic AS VS Percent: 0 %
Brady Statistic RA Percent Paced: 0 %
Brady Statistic RV Percent Paced: 98.28 %
Date Time Interrogation Session: 20220809212255
Implantable Lead Implant Date: 20160620
Implantable Lead Implant Date: 20160620
Implantable Lead Location: 753859
Implantable Lead Location: 753860
Implantable Lead Model: 5076
Implantable Lead Model: 5076
Implantable Pulse Generator Implant Date: 20160620
Lead Channel Impedance Value: 285 Ohm
Lead Channel Impedance Value: 323 Ohm
Lead Channel Impedance Value: 342 Ohm
Lead Channel Impedance Value: 418 Ohm
Lead Channel Pacing Threshold Amplitude: 0.75 V
Lead Channel Pacing Threshold Amplitude: 0.875 V
Lead Channel Pacing Threshold Pulse Width: 0.4 ms
Lead Channel Pacing Threshold Pulse Width: 0.4 ms
Lead Channel Sensing Intrinsic Amplitude: 0.375 mV
Lead Channel Sensing Intrinsic Amplitude: 0.75 mV
Lead Channel Sensing Intrinsic Amplitude: 14.25 mV
Lead Channel Sensing Intrinsic Amplitude: 14.25 mV
Lead Channel Setting Pacing Amplitude: 2 V
Lead Channel Setting Pacing Pulse Width: 0.4 ms
Lead Channel Setting Sensing Sensitivity: 4 mV

## 2021-06-25 ENCOUNTER — Other Ambulatory Visit: Payer: Self-pay

## 2021-06-25 NOTE — Patient Outreach (Signed)
[facility]) Care Management  06/25/2021  LORRIE ROUNSAVILLE 1924/10/16 [id number]   Telephone Assessment   Unsuccessful outreach attempt. RN CM left HIPAA compliant voicemail message along with contact info.      Plan: RN CM will make outreach attempt within the month of Sept if no return call.    Enzo Montgomery, RN,BSN,CCM [place] Management Telephonic Care Management Coordinator Direct Phone: [phone] Toll Free: [phone] Fax: [phone]

## 2021-07-10 NOTE — Progress Notes (Signed)
Remote pacemaker transmission.   

## 2021-07-20 ENCOUNTER — Observation Stay (HOSPITAL_BASED_OUTPATIENT_CLINIC_OR_DEPARTMENT_OTHER)
Admission: EM | Admit: 2021-07-20 | Discharge: 2021-07-22 | Disposition: A | Discharge status: Home / Self Care | Payer: Medicare PPO | Attending: Internal Medicine | Admitting: Internal Medicine

## 2021-07-20 ENCOUNTER — Encounter (HOSPITAL_BASED_OUTPATIENT_CLINIC_OR_DEPARTMENT_OTHER): Payer: Self-pay | Admitting: *Deleted

## 2021-07-20 ENCOUNTER — Other Ambulatory Visit: Payer: Self-pay

## 2021-07-20 ENCOUNTER — Emergency Department (HOSPITAL_BASED_OUTPATIENT_CLINIC_OR_DEPARTMENT_OTHER): Payer: Medicare PPO

## 2021-07-20 DIAGNOSIS — I129 Hypertensive chronic kidney disease with stage 1 through stage 4 chronic kidney disease, or unspecified chronic kidney disease: Secondary | ICD-10-CM | POA: Diagnosis not present

## 2021-07-20 DIAGNOSIS — Z20822 Contact with and (suspected) exposure to covid-19: Secondary | ICD-10-CM | POA: Diagnosis not present

## 2021-07-20 DIAGNOSIS — J449 Chronic obstructive pulmonary disease, unspecified: Secondary | ICD-10-CM | POA: Diagnosis present

## 2021-07-20 DIAGNOSIS — I4819 Other persistent atrial fibrillation: Secondary | ICD-10-CM | POA: Diagnosis present

## 2021-07-20 DIAGNOSIS — R778 Other specified abnormalities of plasma proteins: Secondary | ICD-10-CM | POA: Diagnosis present

## 2021-07-20 DIAGNOSIS — I160 Hypertensive urgency: Secondary | ICD-10-CM | POA: Diagnosis not present

## 2021-07-20 DIAGNOSIS — Z7901 Long term (current) use of anticoagulants: Secondary | ICD-10-CM | POA: Diagnosis not present

## 2021-07-20 DIAGNOSIS — E039 Hypothyroidism, unspecified: Secondary | ICD-10-CM | POA: Diagnosis present

## 2021-07-20 DIAGNOSIS — Z950 Presence of cardiac pacemaker: Secondary | ICD-10-CM | POA: Insufficient documentation

## 2021-07-20 DIAGNOSIS — I480 Paroxysmal atrial fibrillation: Secondary | ICD-10-CM | POA: Insufficient documentation

## 2021-07-20 DIAGNOSIS — R079 Chest pain, unspecified: Principal | ICD-10-CM | POA: Diagnosis present

## 2021-07-20 DIAGNOSIS — Z79899 Other long term (current) drug therapy: Secondary | ICD-10-CM | POA: Insufficient documentation

## 2021-07-20 DIAGNOSIS — R0602 Shortness of breath: Secondary | ICD-10-CM | POA: Diagnosis not present

## 2021-07-20 DIAGNOSIS — I700 Atherosclerosis of aorta: Secondary | ICD-10-CM | POA: Diagnosis present

## 2021-07-20 DIAGNOSIS — I161 Hypertensive emergency: Secondary | ICD-10-CM | POA: Diagnosis present

## 2021-07-20 DIAGNOSIS — R0789 Other chest pain: Secondary | ICD-10-CM | POA: Diagnosis not present

## 2021-07-20 DIAGNOSIS — F418 Other specified anxiety disorders: Secondary | ICD-10-CM | POA: Diagnosis present

## 2021-07-20 DIAGNOSIS — R7989 Other specified abnormal findings of blood chemistry: Secondary | ICD-10-CM | POA: Diagnosis not present

## 2021-07-20 DIAGNOSIS — E876 Hypokalemia: Secondary | ICD-10-CM | POA: Diagnosis present

## 2021-07-20 DIAGNOSIS — E785 Hyperlipidemia, unspecified: Secondary | ICD-10-CM | POA: Diagnosis present

## 2021-07-20 DIAGNOSIS — J45909 Unspecified asthma, uncomplicated: Secondary | ICD-10-CM | POA: Insufficient documentation

## 2021-07-20 DIAGNOSIS — Z96642 Presence of left artificial hip joint: Secondary | ICD-10-CM | POA: Diagnosis not present

## 2021-07-20 DIAGNOSIS — N184 Chronic kidney disease, stage 4 (severe): Secondary | ICD-10-CM | POA: Diagnosis not present

## 2021-07-20 LAB — CBC
HCT: 42.7 % (ref 36.0–46.0)
Hemoglobin: 13.5 g/dL (ref 12.0–15.0)
MCH: 31 pg (ref 26.0–34.0)
MCHC: 31.6 g/dL (ref 30.0–36.0)
MCV: 98.2 fL (ref 80.0–100.0)
Platelets: 171 10*3/uL (ref 150–400)
RBC: 4.35 MIL/uL (ref 3.87–5.11)
RDW: 14.9 % (ref 11.5–15.5)
WBC: 5 10*3/uL (ref 4.0–10.5)
nRBC: 0 % (ref 0.0–0.2)

## 2021-07-20 LAB — BASIC METABOLIC PANEL
Anion gap: 7 (ref 5–15)
BUN: 22 mg/dL (ref 8–23)
CO2: 33 mmol/L — ABNORMAL HIGH (ref 22–32)
Calcium: 8.9 mg/dL (ref 8.9–10.3)
Chloride: 100 mmol/L (ref 98–111)
Creatinine, Ser: 1.13 mg/dL — ABNORMAL HIGH (ref 0.44–1.00)
GFR, Estimated: 45 mL/min — ABNORMAL LOW (ref >60)
Glucose, Bld: 111 mg/dL — ABNORMAL HIGH (ref 70–99)
Potassium: 3.4 mmol/L — ABNORMAL LOW (ref 3.5–5.1)
Sodium: 140 mmol/L (ref 135–145)

## 2021-07-20 LAB — RESP PANEL BY RT-PCR (FLU A&B, COVID) ARPGX2
Influenza A by PCR: NEGATIVE
Influenza B by PCR: NEGATIVE
SARS Coronavirus 2 by RT PCR: NEGATIVE

## 2021-07-20 LAB — TROPONIN I (HIGH SENSITIVITY)
Troponin I (High Sensitivity): 20 ng/L — ABNORMAL HIGH (ref <18)
Troponin I (High Sensitivity): 21 ng/L — ABNORMAL HIGH (ref <18)

## 2021-07-20 NOTE — ED Provider Notes (Signed)
[facility] Provider Note   CSN: [id number] Arrival date & time: 07/20/21  1528     History Chief Complaint  Patient presents with   Shortness of Breath    [name] is a 87 y.o. female.  Patient is a 87 year old female with a history of anxiety, chronic kidney disease, GERD, hypertension, hyperlipidemia and paroxysmal atrial fibrillation, pacemaker placement who presents with chest tightness.  She said it started yesterday.  She describes as a tightness across her chest but more on the left side.  It is otherwise nonradiating.  No associated back or neck pain.  She has some associated shortness of breath.  She says it gets better when she sits down and is worse when she is walking around.  She denies any current discomfort.  No cough or cold symptoms.  No leg swelling.  No fevers.  She has had some similar discomfort in the past but she says it has not been this bad.      Past Medical History:  Diagnosis Date   Allergy    Anemia    Anxiety    Arthritis    Asthma    Benign positional vertigo    Blood transfusion without reported diagnosis    Cardiac pacemaker in situ 05/01/2015   for syncope & Sx Bradycardia -> Medtronic MRI compatible pacemaker placed Dr. Caryl Comes    Cataract    Chronic diarrhea    Chronic kidney disease, stage IV (severe) (HCC)    Colon polyps    Diastolic dysfunction, left ventricle 11/03/2013   Diverticulosis    Esophageal stricture    GERD (gastroesophageal reflux disease)    Gout    Heart murmur    Hemorrhoids    Hyperlipidemia    Hypertensive heart disease    no significant RAS by MRA 2002- (<30% LRAS)    Hypothyroidism    IBS (irritable bowel syndrome)    LBBB (left bundle branch block)- new 11/01/13 11/01/2013   Paroxysmal atrial fibrillation 11/01/2013   Intermittent through the years and recurrent in December of 2014 treated with amiodarone;; Negative Myoview 10/2013   Peripheral vascular disease    123456  LICA, [id number] RICA by doppler 2009    Sinus arrest 04/28/2015   Syncope and collapse 04/28/2015   s/p PPM   Tubulovillous adenoma 4/07    Patient Active Problem List   Diagnosis Date Noted   Chest pain 07/20/2021   Depression with anxiety 05/16/2020   Orthostatic dizziness 04/21/2019   Malnutrition of moderate degree 12/13/2017   Pressure injury of skin 12/12/2017   Hypokalemia 12/11/2017   Acute kidney injury superimposed on CKD ([place]) 12/11/2017   COPD (chronic obstructive pulmonary disease) ([place]) 12/11/2017   Hypocalcemia 12/11/2017   Hypoxia 12/05/2017   Chronic back pain 07/16/2017   Iron deficiency anemia 03/24/2017   IDA (iron deficiency anemia) 03/20/2017   Dyspnea on exertion 02/21/2017   Insomnia, persistent 07/26/2015   Cardiac pacemaker in situ    Right bundle branch block (RBBB) intermittent  04/28/2015   Closed left hip fracture ([place])    Syncope, cardiogenic    Abnormal liver function tests 11/10/2014   Cauda equina syndrome with neurogenic bladder ([place]) 10/04/2014   CKD (chronic kidney disease), stage IV ([place]) 07/31/2014   Gout 06/17/2014   Edema of both legs 02/09/2014   Hypertensive heart and chronic kidney disease with chronic diastolic congestive heart failure ([place]) 12/03/2013   H/O tachycardia-bradycardia syndrome    Chronic anticoagulation- Eliquis 11/17/2013  LBBB (left bundle branch block)- new 11/01/13 11/01/2013   Persistent Paroxysmal atrial fibrillation: CHA2DS2-VASc Score 5, on Eliquis 11/01/2013   Peripheral vascular disease    Benign positional vertigo    Irritable bowel syndrome 03/22/2009   Anxiety state    Reactive airway disease 01/13/2008   Chronic anemia    Osteoarthritis    Hypothyroidism    Hyperlipidemia     Past Surgical History:  Procedure Laterality Date   APPENDECTOMY     CATARACT EXTRACTION     EP IMPLANTABLE DEVICE N/A 05/01/2015   Procedure: Pacemaker Implant;  Surgeon: Deboraha Sprang, MD;  Location: [facility];   Service: Cardiovascular;  Laterality: N/A;   HIP ARTHROPLASTY Left 05/01/2015   Procedure: ARTHROPLASTY BIPOLAR HIP (HEMIARTHROPLASTY);  Surgeon: Rod Can, MD;  Location: [facility];  Service: Orthopedics;  Laterality: Left;   LOW ANTERIOR BOWEL RESECTION  7/08   LUMBAR [place] SURGERY  2000   NM MYOVIEW LTD  03/18/2013   Negative for ischemia or infarction. EF 55%.  LOW RISK   OOPHORECTOMY  1962   right   squamous cell skin cancer removed     TRANSTHORACIC ECHOCARDIOGRAM  10/2013, 02/2017   A) EF 55-60%, mild LVH, elevated bili pressures. Mild aortic valve calcification;; B) Normal LV size and function with mild LVH. Unable to assess diastolic function. Normal PA pressures. Mild MR,Mild AI     OB History   No obstetric history on file.     Family History  Problem Relation Age of Onset   Cancer Mother 2       unknown   Heart attack Father    Heart disease Brother    Stroke Maternal Grandmother    Lung cancer Maternal Grandfather    Stroke Paternal Grandmother    Colon cancer Daughter    Heart attack Son    Esophageal cancer Neg Hx    Rectal cancer Neg Hx    Stomach cancer Neg Hx     Social History   Tobacco Use   Smoking status: Never   Smokeless tobacco: Never   Tobacco comments:    never used tobacco  Vaping Use   Vaping Use: Never used  Substance Use Topics   Alcohol use: No    Alcohol/week: 0.0 standard drinks   Drug use: No    Home Medications Prior to Admission medications   Medication Sig Start Date End Date Taking? Authorizing Provider  acetaminophen (TYLENOL) 325 MG tablet Take 2 tablets (650 mg total) by mouth every 6 (six) hours as needed for mild pain (or Fever >/= 101). 12/17/17   Debbe Odea, MD  amLODipine (NORVASC) 2.5 MG tablet TAKE 1 TABLET BY MOUTH EVERY DAY 05/16/21   [name], MD  apixaban (ELIQUIS) 2.5 MG TABS tablet Take 1 tablet (2.5 mg total) by mouth 2 (two) times daily. 08/07/20   [name], MD  carvedilol (COREG) 6.25 MG  tablet Take 1 tablet (6.25 mg total) by mouth 2 (two) times daily. 09/22/20   [name], MD  hydrALAZINE (APRESOLINE) 100 MG tablet Take 1 tablet (100 mg) in the AM and 1 tablet (100 mg) in the PM. 08/11/20   [name], MD  hydrALAZINE (APRESOLINE) 25 MG tablet Take 75 mg (3 tablets) daily at noon-3pm 08/11/20   [name], MD  hyoscyamine (LEVSIN) 0.125 MG/5ML ELIX Take by mouth as needed.    [provider]  levothyroxine (SYNTHROID) 88 MCG tablet TAKE 1 TABLET DAILY  BEFORE BREAKFAST 10/17/20   [name], MD  nitroGLYCERIN (NITROSTAT) 0.4 MG SL tablet Place 1 tablet (0.4 mg total) under the tongue every 5 (five) minutes x 3 doses as needed for chest pain. 11/22/20   [name], MD  sertraline (ZOLOFT) 100 MG tablet Take 1 tablet (100 mg total) by mouth daily. 04/11/21   [name], MD  temazepam (RESTORIL) 15 MG capsule TAKE 2 CAPSULE BY MOUTH EVERY NIGHT AT BEDTIME AS NEEDED FOR SLEEP 06/15/21   [name], MD    Allergies    Penicillins, Xifaxan [rifaximin], Ambien [zolpidem tartrate], Amoxicillin, Ceftin [cefuroxime axetil], Codeine phosphate, Colchicine, Levofloxacin, and Lipitor [atorvastatin]  Review of Systems   Review of Systems  Constitutional:  Negative for chills, diaphoresis, fatigue and fever.  HENT:  Negative for congestion, rhinorrhea and sneezing.   Eyes: Negative.   Respiratory:  Positive for chest tightness and shortness of breath. Negative for cough.   Cardiovascular:  Negative for chest pain and leg swelling.  Gastrointestinal:  Negative for abdominal pain, blood in stool, diarrhea, nausea and vomiting.  Genitourinary:  Negative for difficulty urinating, flank pain, frequency and hematuria.  Musculoskeletal:  Negative for arthralgias and back pain.  Skin:  Negative for rash.  Neurological:  Negative for dizziness, speech difficulty, weakness, numbness and headaches.   Physical Exam Updated Vital Signs BP (!) 185/95 (BP Location:  Right Arm)   Pulse (!) 59   Temp 98.1 F (36.7 C) (Oral)   Resp (!) 21   Ht '5\' 2"'$  (1.575 m)   Wt 49.9 kg   SpO2 97%   BMI 20.12 kg/m   Physical Exam Constitutional:      Appearance: She is well-developed.  HENT:     Head: Normocephalic and atraumatic.  Eyes:     Pupils: Pupils are equal, round, and reactive to light.  Cardiovascular:     Rate and Rhythm: Normal rate and regular rhythm.     Heart sounds: Normal heart sounds.  Pulmonary:     Effort: Pulmonary effort is normal. No respiratory distress.     Breath sounds: Normal breath sounds. No wheezing or rales.  Chest:     Chest wall: No tenderness.  Abdominal:     General: Bowel sounds are normal.     Palpations: Abdomen is soft.     Tenderness: There is no abdominal tenderness. There is no guarding or rebound.  Musculoskeletal:        General: Normal range of motion.     Cervical back: Normal range of motion and neck supple.     Comments: No edema or calf tenderness  Lymphadenopathy:     Cervical: No cervical adenopathy.  Skin:    General: Skin is warm and dry.     Findings: No rash.  Neurological:     Mental Status: She is alert and oriented to person, place, and time.    ED Results / Procedures / Treatments   Labs (all labs ordered are listed, but only abnormal results are displayed) Labs Reviewed  BASIC METABOLIC PANEL - Abnormal; Notable for the following components:      Result Value   Potassium 3.4 (*)    CO2 33 (*)    Glucose, Bld 111 (*)    Creatinine, Ser 1.13 (*)    GFR, Estimated 45 (*)    All other components within normal limits  TROPONIN I (HIGH SENSITIVITY) - Abnormal; Notable for the following components:   Troponin I (High Sensitivity)  20 (*)    All other components within normal limits  TROPONIN I (HIGH SENSITIVITY) - Abnormal; Notable for the following components:   Troponin I (High Sensitivity) 21 (*)    All other components within normal limits  RESP PANEL BY RT-PCR (FLU A&B, COVID)  ARPGX2  CBC    EKG EKG Interpretation  Date/Time:  Friday July 20 2021 15:53:41 EDT Ventricular Rate:  63 PR Interval:    QRS Duration: 158 QT Interval:  454 QTC Calculation: 464 R Axis:   268 Text Interpretation: Ventricular-paced rhythm Abnormal ECG Confirmed by Malvin Johns [id number]) on 07/20/2021 7:54:06 PM  Radiology DG Chest 2 View  Result Date: 07/20/2021 CLINICAL DATA:  Chest pain and shortness of breath EXAM: CHEST - 2 VIEW COMPARISON:  05/30/2020 FINDINGS: Dual lead cardiac device with leads in right atrium and ventricle. Unchanged cardiac and mediastinal contours. Aortic atherosclerotic calcifications. Hyperinflated lungs. No focal pulmonary opacity. Blunting of the left greater than right costophrenic angle, which may indicate pleural thickening or effusion. No acute osseous abnormality. IMPRESSION: Blunting of the left greater than right costophrenic angle, which may indicate pleural thickening or effusion. Electronically Signed   By: Merilyn Baba M.D.   On: 07/20/2021 17:34    Procedures Procedures   Medications Ordered in ED Medications - No data to display  ED Course  I have reviewed the triage vital signs and the nursing notes.  Pertinent labs & imaging results that were available during my care of the patient were reviewed by me and considered in my medical decision making (see chart for details).    MDM Rules/Calculators/A&P                           Patient is a 87 year old who presents with some exertional chest tightness and shortness of breath.  Her EKG shows a paced rhythm.  Her chest x-ray is nonconcerning.  There is no evidence of pneumonia or pulmonary edema.  There is a possible small pleural effusion.  She is currently chest pain-free.  Her troponins are mildly elevated but stable.  She had some similar values about a year ago.  I spoke with Dr. Burt Knack with cardiology.  In discussion with him, we will admit to the hospitalist service and Dr. Burt Knack  will asked the cardiology fellow to see the patient when she is over at [facility].  I spoke with Dr. Tonie Griffith who will admit the patient for further treatment. Final Clinical Impression(s) / ED Diagnoses Final diagnoses:  Chest pain, unspecified type    Rx / DC Orders ED Discharge Orders     None        Malvin Johns, MD 07/20/21 2219

## 2021-07-20 NOTE — ED Triage Notes (Signed)
C/o  SOB and CP  x 3 days

## 2021-07-21 ENCOUNTER — Observation Stay (HOSPITAL_BASED_OUTPATIENT_CLINIC_OR_DEPARTMENT_OTHER): Payer: Medicare PPO

## 2021-07-21 ENCOUNTER — Encounter (HOSPITAL_COMMUNITY): Payer: Self-pay | Admitting: Family Medicine

## 2021-07-21 DIAGNOSIS — R778 Other specified abnormalities of plasma proteins: Secondary | ICD-10-CM

## 2021-07-21 DIAGNOSIS — R079 Chest pain, unspecified: Secondary | ICD-10-CM

## 2021-07-21 DIAGNOSIS — I160 Hypertensive urgency: Secondary | ICD-10-CM | POA: Diagnosis present

## 2021-07-21 DIAGNOSIS — J45909 Unspecified asthma, uncomplicated: Secondary | ICD-10-CM | POA: Diagnosis not present

## 2021-07-21 DIAGNOSIS — I480 Paroxysmal atrial fibrillation: Secondary | ICD-10-CM | POA: Diagnosis not present

## 2021-07-21 DIAGNOSIS — Z79899 Other long term (current) drug therapy: Secondary | ICD-10-CM | POA: Diagnosis not present

## 2021-07-21 DIAGNOSIS — R7989 Other specified abnormal findings of blood chemistry: Secondary | ICD-10-CM | POA: Diagnosis present

## 2021-07-21 DIAGNOSIS — R0602 Shortness of breath: Secondary | ICD-10-CM | POA: Diagnosis present

## 2021-07-21 DIAGNOSIS — I700 Atherosclerosis of aorta: Secondary | ICD-10-CM | POA: Diagnosis not present

## 2021-07-21 DIAGNOSIS — E876 Hypokalemia: Secondary | ICD-10-CM

## 2021-07-21 DIAGNOSIS — I129 Hypertensive chronic kidney disease with stage 1 through stage 4 chronic kidney disease, or unspecified chronic kidney disease: Secondary | ICD-10-CM | POA: Diagnosis not present

## 2021-07-21 DIAGNOSIS — Z96642 Presence of left artificial hip joint: Secondary | ICD-10-CM | POA: Diagnosis not present

## 2021-07-21 DIAGNOSIS — N184 Chronic kidney disease, stage 4 (severe): Secondary | ICD-10-CM | POA: Diagnosis not present

## 2021-07-21 DIAGNOSIS — Z950 Presence of cardiac pacemaker: Secondary | ICD-10-CM | POA: Diagnosis not present

## 2021-07-21 DIAGNOSIS — Z20822 Contact with and (suspected) exposure to covid-19: Secondary | ICD-10-CM | POA: Diagnosis not present

## 2021-07-21 DIAGNOSIS — J449 Chronic obstructive pulmonary disease, unspecified: Secondary | ICD-10-CM | POA: Diagnosis not present

## 2021-07-21 DIAGNOSIS — Z7901 Long term (current) use of anticoagulants: Secondary | ICD-10-CM | POA: Diagnosis not present

## 2021-07-21 DIAGNOSIS — E039 Hypothyroidism, unspecified: Secondary | ICD-10-CM | POA: Diagnosis not present

## 2021-07-21 DIAGNOSIS — I161 Hypertensive emergency: Secondary | ICD-10-CM | POA: Diagnosis present

## 2021-07-21 HISTORY — DX: Atherosclerosis of aorta: I70.0

## 2021-07-21 LAB — ECHOCARDIOGRAM COMPLETE
AR max vel: 1.67 cm2
AV Area VTI: 1.6 cm2
AV Area mean vel: 1.67 cm2
AV Mean grad: 4 mmHg
AV Peak grad: 6.9 mmHg
Ao pk vel: 1.31 m/s
Area-P 1/2: 4.4 cm2
Height: 62 in
MV M vel: 4.42 m/s
MV Peak grad: 78.1 mmHg
S' Lateral: 2.6 cm
Single Plane A4C EF: 60.7 %
Weight: 1795.43 oz

## 2021-07-21 LAB — BASIC METABOLIC PANEL
Anion gap: 8 (ref 5–15)
BUN: 19 mg/dL (ref 8–23)
CO2: 30 mmol/L (ref 22–32)
Calcium: 8.2 mg/dL — ABNORMAL LOW (ref 8.9–10.3)
Chloride: 102 mmol/L (ref 98–111)
Creatinine, Ser: 1.29 mg/dL — ABNORMAL HIGH (ref 0.44–1.00)
GFR, Estimated: 38 mL/min — ABNORMAL LOW (ref >60)
Glucose, Bld: 134 mg/dL — ABNORMAL HIGH (ref 70–99)
Potassium: 3.3 mmol/L — ABNORMAL LOW (ref 3.5–5.1)
Sodium: 140 mmol/L (ref 135–145)

## 2021-07-21 LAB — MAGNESIUM: Magnesium: 1.8 mg/dL (ref 1.7–2.4)

## 2021-07-21 MED ORDER — LEVOTHYROXINE SODIUM 88 MCG PO TABS
88.0000 ug | ORAL_TABLET | Freq: Every day | ORAL | Status: DC
  Administered 2021-07-21 – 2021-07-22 (×2): 88 ug via ORAL
  Filled 2021-07-21 (×2): qty 1

## 2021-07-21 MED ORDER — SERTRALINE HCL 100 MG PO TABS
100.0000 mg | ORAL_TABLET | Freq: Every day | ORAL | Status: DC
  Administered 2021-07-21 – 2021-07-22 (×2): 100 mg via ORAL
  Filled 2021-07-21 (×2): qty 1

## 2021-07-21 MED ORDER — LABETALOL HCL 5 MG/ML IV SOLN
10.0000 mg | INTRAVENOUS | Status: DC | PRN
  Administered 2021-07-21: 10 mg via INTRAVENOUS
  Filled 2021-07-21: qty 4

## 2021-07-21 MED ORDER — AMLODIPINE BESYLATE 5 MG PO TABS
5.0000 mg | ORAL_TABLET | Freq: Every day | ORAL | Status: DC
  Administered 2021-07-21 – 2021-07-22 (×2): 5 mg via ORAL
  Filled 2021-07-21 (×3): qty 1

## 2021-07-21 MED ORDER — HYDRALAZINE HCL 50 MG PO TABS
100.0000 mg | ORAL_TABLET | Freq: Two times a day (BID) | ORAL | Status: DC
  Administered 2021-07-21 – 2021-07-22 (×3): 100 mg via ORAL
  Filled 2021-07-21 (×3): qty 2

## 2021-07-21 MED ORDER — ALPRAZOLAM 0.25 MG PO TABS
0.2500 mg | ORAL_TABLET | Freq: Once | ORAL | Status: AC
  Administered 2021-07-21: 0.25 mg via ORAL
  Filled 2021-07-21: qty 1

## 2021-07-21 MED ORDER — TEMAZEPAM 15 MG PO CAPS
30.0000 mg | ORAL_CAPSULE | Freq: Every evening | ORAL | Status: DC | PRN
  Administered 2021-07-21: 30 mg via ORAL
  Filled 2021-07-21: qty 2

## 2021-07-21 MED ORDER — CARVEDILOL 6.25 MG PO TABS
6.2500 mg | ORAL_TABLET | Freq: Two times a day (BID) | ORAL | Status: DC
  Administered 2021-07-21 (×2): 6.25 mg via ORAL
  Filled 2021-07-21 (×2): qty 1

## 2021-07-21 MED ORDER — NITROGLYCERIN 0.4 MG SL SUBL: 0.4000 mg | SUBLINGUAL_TABLET | SUBLINGUAL | Status: DC | PRN

## 2021-07-21 MED ORDER — MAGNESIUM SULFATE 2 GM/50ML IV SOLN
2.0000 g | Freq: Once | INTRAVENOUS | Status: AC
  Administered 2021-07-21: 2 g via INTRAVENOUS
  Filled 2021-07-21: qty 50

## 2021-07-21 MED ORDER — POTASSIUM CHLORIDE CRYS ER 20 MEQ PO TBCR
20.0000 meq | EXTENDED_RELEASE_TABLET | Freq: Once | ORAL | Status: AC
  Administered 2021-07-21: 20 meq via ORAL
  Filled 2021-07-21: qty 1

## 2021-07-21 MED ORDER — APIXABAN 2.5 MG PO TABS
2.5000 mg | ORAL_TABLET | Freq: Two times a day (BID) | ORAL | Status: DC
  Administered 2021-07-21 – 2021-07-22 (×3): 2.5 mg via ORAL
  Filled 2021-07-21 (×3): qty 1

## 2021-07-21 MED ORDER — HYDRALAZINE HCL 50 MG PO TABS
75.0000 mg | ORAL_TABLET | Freq: Every day | ORAL | Status: DC
  Administered 2021-07-21: 75 mg via ORAL
  Filled 2021-07-21: qty 1

## 2021-07-21 NOTE — Progress Notes (Signed)
Patient seen and examined at the bedside.  Her daughter is concerned about elevated blood pressure at home.  She said her blood pressure is mostly high at home.  She attributes this to stress and anxiety that the patient has been experiencing at home.  She said she was concerned because patient had chest pressure and shortness of breath.  BP is better this morning.  Exam is significant for bilateral pedal edema.  Troponins are minimally elevated and flat.  ACS is unlikely.  2D echo is pending.  Repeat BMP.  Butch Penny, daughter-in-law, is concerned about taking patient home today.  Monitor patient overnight.  Plan discussed with the patient and her daughter-in-law, Butch Penny, at the bedside.

## 2021-07-21 NOTE — H&P (Signed)
History and Physical    [name] [id number] DOB: 03-Oct-1924 DOA: 07/20/2021  PCP: [name], MD   Patient coming from: Home.   I have personally briefly reviewed patient's old medical records in [place]  Chief Complaint: Chest pain.  HPI: [name] is a 87 y.o. female with medical history significant of allergy, microcytic anemia, anxiety, osteoarthritis, asthma, benign positional vertigo, cataracts, chronic diarrhea, IBS, stage IIIa CKD, diverticulosis, GERD, esophageal stricture, gout, hemorrhoids, hyperlipidemia, hypothyroidism,, diastolic dysfunction, sinus arrest, syncope and collapse, tachybradycardia syndrome with pacemaker placement, LBBB, persistent atrial fibrillation, hypertensive heart disease, peripheral vascular disease who went through the emergency department [facility] due to a known radiated, on and off chest pressure sensation since Thursday evening associated with mild dyspnea, palpitations, anxiety.  She had trouble sleeping the night before.  No PND, orthopnea or recent pitting edema lower extremities.  She has been taking her antihypertensives.  She occasionally adds salt to her meals.  She denied fever, chills, sore throat rhinorrhea, no wheezing, no hemoptysis.  Denied dysuria, frequency or materia.  No polyuria, polydipsia, polyphagia or blurred vision.  ED Course: Initial vital signs were temperature 97.7 F, pulse 62, respiration 20, BP 177/85 mmHg O2 sat 98% on room air.   Lab work: Her CBC was normal.  Troponin was 20 and then 21 ng/L.  BMP showed a potassium of 3.4 and CO2 of 33 mmol/L.  Glucose nonfasting was 111, BUN 28 and creatinine 1.13 mg/dL.  Imaging: 2 view chest radiograph showed blunting of the left greater than right costophrenic angle which may indicate pleural thickening or effusion.  There was aortic atherosclerosis.  Review of Systems: As per HPI otherwise all other systems reviewed and are negative.  Past  Medical History:  Diagnosis Date   Allergy    Anemia    Anxiety    Arthritis    Asthma    Benign positional vertigo    Blood transfusion without reported diagnosis    Cardiac pacemaker in situ 05/01/2015   for syncope & Sx Bradycardia -> Medtronic MRI compatible pacemaker placed Dr. Caryl Comes    Cataract    Chronic diarrhea    Chronic kidney disease, stage IV (severe) (HCC)    Colon polyps    Diastolic dysfunction, left ventricle 11/03/2013   Diverticulosis    Esophageal stricture    GERD (gastroesophageal reflux disease)    Gout    Heart murmur    Hemorrhoids    Hyperlipidemia    Hypertensive heart disease    no significant RAS by MRA 2002- (<30% LRAS)    Hypothyroidism    IBS (irritable bowel syndrome)    LBBB (left bundle branch block)- new 11/01/13 11/01/2013   Paroxysmal atrial fibrillation 11/01/2013   Intermittent through the years and recurrent in December of 2014 treated with amiodarone;; Negative Myoview 10/2013   Peripheral vascular disease    123456 LICA, [id number] RICA by doppler 2009    Sinus arrest 04/28/2015   Syncope and collapse 04/28/2015   s/p PPM   Tubulovillous adenoma 4/07   Past Surgical History:  Procedure Laterality Date   APPENDECTOMY     CATARACT EXTRACTION     EP IMPLANTABLE DEVICE N/A 05/01/2015   Procedure: Pacemaker Implant;  Surgeon: Deboraha Sprang, MD;  Location: [facility];  Service: Cardiovascular;  Laterality: N/A;   HIP ARTHROPLASTY Left 05/01/2015   Procedure: ARTHROPLASTY BIPOLAR HIP (HEMIARTHROPLASTY);  Surgeon: Rod Can, MD;  Location: [place];  Service:  Orthopedics;  Laterality: Left;   LOW ANTERIOR BOWEL RESECTION  7/08   LUMBAR [place] SURGERY  2000   NM MYOVIEW LTD  03/18/2013   Negative for ischemia or infarction. EF 55%.  LOW RISK   OOPHORECTOMY  1962   right   squamous cell skin cancer removed     TRANSTHORACIC ECHOCARDIOGRAM  10/2013, 02/2017   A) EF 55-60%, mild LVH, elevated bili pressures. Mild aortic valve  calcification;; B) Normal LV size and function with mild LVH. Unable to assess diastolic function. Normal PA pressures. Mild MR,Mild AI   Social History  reports that she has never smoked. She has never used smokeless tobacco. She reports that she does not drink alcohol and does not use drugs.  Allergies  Allergen Reactions   Penicillins Swelling    REACTION: nausea, swelling   Xifaxan [Rifaximin] Other (See Comments) and Rash    unknown   Ambien [Zolpidem Tartrate]     " made her crazy "   Amoxicillin     REACTION: unspecified   Ceftin [Cefuroxime Axetil] Diarrhea   Codeine Phosphate     REACTION: unspecified   Colchicine     Severe diarrhea   Levofloxacin Other (See Comments)    Tingle numbness   Lipitor [Atorvastatin] Other (See Comments)    "makes legs jump all night"   Family History  Problem Relation Age of Onset   Cancer Mother 59       unknown   Heart attack Father    Heart disease Brother    Stroke Maternal Grandmother    Lung cancer Maternal Grandfather    Stroke Paternal Grandmother    Colon cancer Daughter    Heart attack Son    Esophageal cancer Neg Hx    Rectal cancer Neg Hx    Stomach cancer Neg Hx    Prior to Admission medications   Medication Sig Start Date End Date Taking? Authorizing Provider  acetaminophen (TYLENOL) 325 MG tablet Take 2 tablets (650 mg total) by mouth every 6 (six) hours as needed for mild pain (or Fever >/= 101). 12/17/17   Debbe Odea, MD  amLODipine (NORVASC) 2.5 MG tablet TAKE 1 TABLET BY MOUTH EVERY DAY 05/16/21   [name], MD  apixaban (ELIQUIS) 2.5 MG TABS tablet Take 1 tablet (2.5 mg total) by mouth 2 (two) times daily. 08/07/20   [name], MD  carvedilol (COREG) 6.25 MG tablet Take 1 tablet (6.25 mg total) by mouth 2 (two) times daily. 09/22/20   [name], MD  hydrALAZINE (APRESOLINE) 100 MG tablet Take 1 tablet (100 mg) in the AM and 1 tablet (100 mg) in the PM. 08/11/20   [name], MD   hydrALAZINE (APRESOLINE) 25 MG tablet Take 75 mg (3 tablets) daily at noon-3pm 08/11/20   [name], MD  hyoscyamine (LEVSIN) 0.125 MG/5ML ELIX Take by mouth as needed.    [provider]  levothyroxine (SYNTHROID) 88 MCG tablet TAKE 1 TABLET DAILY BEFORE BREAKFAST 10/17/20   [name], MD  nitroGLYCERIN (NITROSTAT) 0.4 MG SL tablet Place 1 tablet (0.4 mg total) under the tongue every 5 (five) minutes x 3 doses as needed for chest pain. 11/22/20   [name], MD  sertraline (ZOLOFT) 100 MG tablet Take 1 tablet (100 mg total) by mouth daily. 04/11/21   [name], MD  temazepam (RESTORIL) 15 MG capsule TAKE 2 CAPSULE BY MOUTH EVERY NIGHT AT BEDTIME AS NEEDED FOR SLEEP 06/15/21  [name], MD   Physical Exam: Vitals:   07/20/21 1946 07/20/21 2130 07/21/21 0015 07/21/21 0144  BP: (!) 200/96 (!) 185/95 (!) 147/72 (!) 202/99  Pulse: 62 (!) 59 62 68  Resp: 16 (!) '21 18 14  '$ Temp:  98.1 F (36.7 C)  98.4 F (36.9 C)  TempSrc:  Oral  Oral  SpO2: 97% 97% 96% 97%  Weight:      Height:       Constitutional: Frail, elderly female.  In NAD. Eyes: Pupils are initial cardiac due to right eye coloboma. ENMT: Mucous membranes are moist. Posterior pharynx clear of any exudate or lesions. Neck: normal, supple, no masses, no thyromegaly Respiratory: clear to auscultation bilaterally, no wheezing, no crackles. Normal respiratory effort. No accessory muscle use.  Cardiovascular: Regular rate and rhythm, no murmurs / rubs / gallops. No extremity edema. 2+ pedal pulses. No carotid bruits.  Abdomen: No distention.  Bowel sounds positive.  Soft, no tenderness, no masses palpated. No hepatosplenomegaly. Musculoskeletal: Mild generalized weakness.  No clubbing / cyanosis. Good ROM, no contractures. Normal muscle tone.  Skin: Multiple hyperpigmented macules on extremities and face Neurologic: CN 2-12 grossly intact. Sensation intact, DTR normal. Strength 5/5 in all 4.  Psychiatric:  Normal judgment and insight. Alert and oriented x 3. Normal mood.   Labs on Admission: I have personally reviewed following labs and imaging studies  CBC: Recent Labs  Lab 07/20/21 1624  WBC 5.0  HGB 13.5  HCT 42.7  MCV 98.2  PLT [id number]    Basic Metabolic Panel: Recent Labs  Lab 07/20/21 1624  NA 140  K 3.4*  CL 100  CO2 33*  GLUCOSE 111*  BUN 22  CREATININE 1.13*  CALCIUM 8.9   GFR: Estimated Creatinine Clearance: 24 mL/min (A) (by C-G formula based on SCr of 1.13 mg/dL (H)).  Liver Function Tests: No results for input(s): AST, ALT, ALKPHOS, BILITOT, PROT, ALBUMIN in the last 168 hours.  Radiological Exams on Admission: DG Chest 2 View  Result Date: 07/20/2021 CLINICAL DATA:  Chest pain and shortness of breath EXAM: CHEST - 2 VIEW COMPARISON:  05/30/2020 FINDINGS: Dual lead cardiac device with leads in right atrium and ventricle. Unchanged cardiac and mediastinal contours. Aortic atherosclerotic calcifications. Hyperinflated lungs. No focal pulmonary opacity. Blunting of the left greater than right costophrenic angle, which may indicate pleural thickening or effusion. No acute osseous abnormality. IMPRESSION: Blunting of the left greater than right costophrenic angle, which may indicate pleural thickening or effusion. Electronically Signed   By: Merilyn Baba M.D.   On: 07/20/2021 17:34    EKG: Independently reviewed.  Vent. rate 63 BPM PR interval * ms QRS duration 158 ms QT/QTcB 454/464 ms P-R-T axes * 268 87 Ventricular-paced rhythm Abnormal ECG  Updated medication list will be brought by relatives in a.m. Assessment/Plan Principal Problem:   Chest pain In the setting of   Hypertensive emergency Observation/telemetry. Amlodipine 5 mg p.o. x1 now. Labetalol 10 mg IVP every 2 hours For SBP over 179 mmHg. Anxiety and insomnia management. Cardiology on-call contacted and recommended: Echocardiogram in the morning. Blood pressure control. Contact cardiology as  needed.  Active Problems:   Elevated troponin Likely demand ischemia Echocardiogram recommended by cardiology.  Updated med list will be brought by relatives in a.m.   Hypothyroidism Continue levohyroxine once med reconciliation done.    Hyperlipidemia   Aortic atherosclerosis Consider statin if not on medication list.    Persistent Paroxysmal atrial fibrillation CHA2DS2-VASc Score 5 Continue  Eliquis. Continue beta-blocker for rate control.    Hypokalemia Replacement given. Magnesium was supplemented. Follow-up potassium level.    COPD (chronic obstructive pulmonary disease) ([place]) Supplemental oxygen as needed. Bronchodilators as needed.    Depression with anxiety Continue sertraline and temazepam once med list confirmed.    DVT prophylaxis: On apixaban. Code Status:   Full code.  Family Communication:   Disposition Plan:   Patient is from:  Home.  Anticipated DC to:  Home.  Anticipated DC date:  07/21/2021 or 07/22/2021.  Anticipated DC barriers: Clinical status.  Consults called:   Admission status:  Observation/telemetry.   Severity of Illness: High severity after presenting to the emergency department with complaints of chest pain in the setting of hypertensive emergency with work-up showing mild elevation of the troponin level.  The patient will remain in the hospital for 24 to 48 hours for blood pressure control and further work-up.  Reubin Milan MD [facility]  How to contact the [facility] Attending or Consulting provider [place] or covering provider during after hours [place], for this patient?   Check the care team in [facility] and look for a) attending/consulting [facility] provider listed and b) the [facility] team listed Log into [email or web address] and use [place]'s universal password to access. If you do not have the password, please contact the hospital operator. Locate the [facility] provider you are looking for under [facility] and page to a number that you can  be directly reached. If you still have difficulty reaching the provider, please page the [facility] (Director on Call) for the Hospitalists listed on amion for assistance.  07/21/2021, 2:24 AM   This document was prepared using [employer] and may contain some unintended transcription errors.

## 2021-07-21 NOTE — Plan of Care (Signed)
Patient progressing 

## 2021-07-22 DIAGNOSIS — I160 Hypertensive urgency: Secondary | ICD-10-CM | POA: Diagnosis not present

## 2021-07-22 DIAGNOSIS — F418 Other specified anxiety disorders: Secondary | ICD-10-CM | POA: Diagnosis not present

## 2021-07-22 DIAGNOSIS — R079 Chest pain, unspecified: Secondary | ICD-10-CM | POA: Diagnosis not present

## 2021-07-22 MED ORDER — CARVEDILOL 12.5 MG PO TABS: 12.5000 mg | ORAL_TABLET | Freq: Two times a day (BID) | ORAL | Status: DC

## 2021-07-22 MED ORDER — POTASSIUM CHLORIDE CRYS ER 20 MEQ PO TBCR
40.0000 meq | EXTENDED_RELEASE_TABLET | Freq: Once | ORAL | Status: AC
  Administered 2021-07-22: 40 meq via ORAL
  Filled 2021-07-22: qty 2

## 2021-07-22 MED ORDER — FUROSEMIDE 40 MG PO TABS
40.0000 mg | ORAL_TABLET | Freq: Once | ORAL | Status: AC
  Administered 2021-07-22: 40 mg via ORAL
  Filled 2021-07-22: qty 1

## 2021-07-22 NOTE — Discharge Summary (Addendum)
Physician Discharge Summary  [name] [id number] DOB: 1923/11/22 DOA: 07/20/2021  PCP: [name], MD  Admit date: 07/20/2021 Discharge date: 07/22/2021  Discharge disposition: Home   Recommendations for Outpatient Follow-Up:   Follow-up with PCP in 1 week  Discharge Diagnosis:   Principal Problem:   Chest pain Active Problems:   Hypothyroidism   Hyperlipidemia   Persistent Paroxysmal atrial fibrillation: CHA2DS2-VASc Score 5, on Eliquis   Hypokalemia   COPD (chronic obstructive pulmonary disease) (HCC)   Depression with anxiety   Hypertensive urgency   Elevated troponin   Aortic atherosclerosis ([place])    Discharge Condition: Stable.  Diet recommendation:  Diet Order             Diet - low sodium heart healthy           Diet Heart Room service appropriate? Yes; Fluid consistency: Thin  Diet effective now                     Code Status: Full Code     Hospital Course:   Ms. [name] is a 87 year old woman with medical history significant of allergy, microcytic anemia, anxiety, osteoarthritis, asthma, benign positional vertigo, cataracts, chronic diarrhea, IBS, stage IIIb CKD, diverticulosis, GERD, esophageal stricture, gout, hemorrhoids, hyperlipidemia, hypothyroidism,, diastolic dysfunction, sinus arrest, syncope and collapse, tachybradycardia syndrome with pacemaker placement, LBBB, persistent atrial fibrillation, hypertensive heart disease, peripheral vascular disease.  She presented to the hospital because of intermittent chest pressure associated with shortness of breath, palpitations and anxiety.  Her blood pressure was significantly elevated at 214/88.  She was admitted to the hospital for hypertensive urgency.  ACS was ruled out.  She had hypokalemia that was treated.  Butch Penny, her daughter-in-law, since the patient suffers from anxiety and she is easily stressed by daily events.  She said her blood pressure has been difficult to control  for some time now and her PCP has been working on it.  Her symptoms have resolved and she feels much better.  Blood pressure is better.  No changes were made to her antihypertensives.  Close follow-up with her PCP was strongly recommended to monitor her BP and electrolytes. Discharge plan discussed with Butch Penny, daughter-in-law, on the day of discharge.  I also spoke to her son, [name].    Discharge Exam:    Vitals:   07/22/21 0436 07/22/21 0735 07/22/21 1001 07/22/21 1224  BP: (!) 149/76 (!) 181/94 (!) 162/92 (!) 153/80  Pulse: 60 64  62  Resp: 16 16    Temp: (!) 97.5 F (36.4 C) 97.7 F (36.5 C)  98 F (36.7 C)  TempSrc: Oral Oral  Oral  SpO2: 91% 95%  99%  Weight: 50.8 kg     Height:         GEN: NAD SKIN: Warm and dry EYES: No pallor or icterus ENT: MMM CV: RRR PULM: CTA B ABD: soft, ND, NT, +BS CNS: AAO x 3, non focal EXT: No edema or tenderness PSYCH: Calm and cooperative   The results of significant diagnostics from this hospitalization (including imaging, microbiology, ancillary and laboratory) are listed below for reference.     Procedures and Diagnostic Studies:   DG Chest 2 View  Result Date: 07/20/2021 CLINICAL DATA:  Chest pain and shortness of breath EXAM: CHEST - 2 VIEW COMPARISON:  05/30/2020 FINDINGS: Dual lead cardiac device with leads in right atrium and ventricle. Unchanged cardiac and mediastinal contours. Aortic atherosclerotic calcifications. Hyperinflated lungs. No focal pulmonary  opacity. Blunting of the left greater than right costophrenic angle, which may indicate pleural thickening or effusion. No acute osseous abnormality. IMPRESSION: Blunting of the left greater than right costophrenic angle, which may indicate pleural thickening or effusion. Electronically Signed   By: Merilyn Baba M.D.   On: 07/20/2021 17:34   ECHOCARDIOGRAM COMPLETE  Result Date: 07/21/2021    ECHOCARDIOGRAM REPORT   Patient Name:   [name] Date of Exam: 07/21/2021  Medical Rec #:  [id number]      Height:       62.0 in Accession #:    [id number]     Weight:       112.2 lb Date of Birth:  1924/05/19     BSA:          1.495 m Patient Age:    87 years       BP:           139/65 mmHg Patient Gender: F              HR:           64 bpm. Exam Location:  Inpatient Procedure: 2D Echo, Cardiac Doppler and Color Doppler Indications:    Elevated Troponin  History:        Patient has prior history of Echocardiogram examinations, most                 recent 03/06/2017. COPD, Arrythmias:LBBB; Signs/Symptoms:Dyspnea.  Sonographer:    [place] Referring Phys: [id number] [place]  1. Left ventricular ejection fraction, by estimation, is 60 to 65%. The left ventricle has normal function. The left ventricle has no regional wall motion abnormalities. Left ventricular diastolic parameters are indeterminate.  2. Right ventricular systolic function is normal. The right ventricular size is normal. There is mildly elevated pulmonary artery systolic pressure.  3. Left atrial size was severely dilated.  4. Right atrial size was mildly dilated.  5. A small pericardial effusion is present. The pericardial effusion is localized near the right atrium and anterior to the right ventricle. There is no evidence of cardiac tamponade.  6. The mitral valve is normal in structure. Mild mitral valve regurgitation. No evidence of mitral stenosis.  7. Tricuspid valve regurgitation is mild to moderate.  8. The aortic valve is tricuspid. There is mild thickening of the aortic valve. Aortic valve regurgitation is not visualized. Mild aortic valve sclerosis is present, with no evidence of aortic valve stenosis. Aortic valve area, by VTI measures 1.60 cm.  Aortic valve mean gradient measures 4.0 mmHg. Aortic valve Vmax measures 1.31 m/s.  9. The right lower pulmonary vein is abnormal. The inferior vena cava is normal in size with greater than 50% respiratory variability, suggesting right atrial pressure of 3  mmHg. FINDINGS  Left Ventricle: Left ventricular ejection fraction, by estimation, is 60 to 65%. The left ventricle has normal function. The left ventricle has no regional wall motion abnormalities. The left ventricular internal cavity size was normal in size. There is  no left ventricular hypertrophy. Left ventricular diastolic parameters are indeterminate. Right Ventricle: The right ventricular size is normal. No increase in right ventricular wall thickness. Right ventricular systolic function is normal. There is mildly elevated pulmonary artery systolic pressure. The tricuspid regurgitant velocity is 2.99  m/s, and with an assumed right atrial pressure of 3 mmHg, the estimated right ventricular systolic pressure is [id number] mmHg. Left Atrium: Left atrial size was severely dilated. Right Atrium: Right atrial size was mildly  dilated. Pericardium: A small pericardial effusion is present. The pericardial effusion is localized near the right atrium and anterior to the right ventricle. There is no evidence of cardiac tamponade. Mitral Valve: The mitral valve is normal in structure. Mild mitral valve regurgitation. No evidence of mitral valve stenosis. Tricuspid Valve: The tricuspid valve is normal in structure. Tricuspid valve regurgitation is mild to moderate. No evidence of tricuspid stenosis. Aortic Valve: The aortic valve is tricuspid. There is mild thickening of the aortic valve. Aortic valve regurgitation is not visualized. Mild aortic valve sclerosis is present, with no evidence of aortic valve stenosis. Aortic valve mean gradient measures 4.0 mmHg. Aortic valve peak gradient measures 6.9 mmHg. Aortic valve area, by VTI measures 1.60 cm. Pulmonic Valve: The pulmonic valve was normal in structure. Pulmonic valve regurgitation is not visualized. No evidence of pulmonic stenosis. Aorta: The aortic root is normal in size and structure. Venous: The right lower pulmonary vein is abnormal. The inferior vena cava is normal  in size with greater than 50% respiratory variability, suggesting right atrial pressure of 3 mmHg. IAS/Shunts: No atrial level shunt detected by color flow Doppler. Additional Comments: A device lead is visualized.  LEFT VENTRICLE PLAX 2D LVIDd:         4.10 cm     Diastology LVIDs:         2.60 cm     LV e' medial:    7.94 cm/s LV PW:         1.19 cm     LV E/e' medial:  16.4 LV IVS:        1.14 cm     LV e' lateral:   4.90 cm/s LVOT diam:     1.80 cm     LV E/e' lateral: 26.5 LV SV:         48 LV SV Index:   32 LVOT Area:     2.54 cm  LV Volumes (MOD) LV vol d, MOD A4C: 59.1 ml LV vol s, MOD A4C: 23.2 ml LV SV MOD A4C:     59.1 ml RIGHT VENTRICLE            IVC RV S prime:     8.81 cm/s  IVC diam: 1.90 cm TAPSE (M-mode): 1.7 cm LEFT ATRIUM              Index       RIGHT ATRIUM           Index LA diam:        3.80 cm  2.54 cm/m  RA Area:     16.90 cm LA Vol (A2C):   114.0 ml 76.24 ml/m RA Volume:   48.40 ml  32.37 ml/m LA Vol (A4C):   90.3 ml  60.39 ml/m LA Biplane Vol: 102.0 ml 68.21 ml/m  AORTIC VALVE                   PULMONIC VALVE AV Area (Vmax):    1.67 cm    PV Vmax:       0.59 m/s AV Area (Vmean):   1.67 cm    PV Peak grad:  1.4 mmHg AV Area (VTI):     1.60 cm AV Vmax:           131.00 cm/s AV Vmean:          99.400 cm/s AV VTI:            0.299 m AV Peak Grad:  6.9 mmHg AV Mean Grad:      4.0 mmHg LVOT Vmax:         85.80 cm/s LVOT Vmean:        65.200 cm/s LVOT VTI:          0.188 m LVOT/AV VTI ratio: 0.63  AORTA Ao Root diam: 2.50 cm Ao Asc diam:  3.20 cm MITRAL VALVE                TRICUSPID VALVE MV Area (PHT): 4.40 cm     TR Peak grad:   35.8 mmHg MR Peak grad: 78.1 mmHg     TR Vmax:        299.00 cm/s MR Vmax:      442.00 cm/s MV E velocity: 130.00 cm/s  SHUNTS                             Systemic VTI:  0.19 m                             Systemic Diam: 1.80 cm Skeet Latch MD Electronically signed by Skeet Latch MD Signature Date/Time: 07/21/2021/2:17:52 PM    Final       Labs:   Basic Metabolic Panel: Recent Labs  Lab 07/20/21 1624 07/21/21 1346  NA 140 140  K 3.4* 3.3*  CL 100 102  CO2 33* 30  GLUCOSE 111* 134*  BUN 22 19  CREATININE 1.13* 1.29*  CALCIUM 8.9 8.2*  MG  --  1.8   GFR Estimated Creatinine Clearance: 21.1 mL/min (A) (by C-G formula based on SCr of 1.29 mg/dL (H)). Liver Function Tests: No results for input(s): AST, ALT, ALKPHOS, BILITOT, PROT, ALBUMIN in the last 168 hours. No results for input(s): LIPASE, AMYLASE in the last 168 hours. No results for input(s): AMMONIA in the last 168 hours. Coagulation profile No results for input(s): INR, PROTIME in the last 168 hours.  CBC: Recent Labs  Lab 07/20/21 1624  WBC 5.0  HGB 13.5  HCT 42.7  MCV 98.2  PLT 171   Cardiac Enzymes: No results for input(s): CKTOTAL, CKMB, CKMBINDEX, TROPONINI in the last 168 hours. BNP: Invalid input(s): POCBNP CBG: No results for input(s): GLUCAP in the last 168 hours. D-Dimer No results for input(s): DDIMER in the last 72 hours. Hgb A1c No results for input(s): HGBA1C in the last 72 hours. Lipid Profile No results for input(s): CHOL, HDL, LDLCALC, TRIG, CHOLHDL, LDLDIRECT in the last 72 hours. Thyroid function studies No results for input(s): TSH, T4TOTAL, T3FREE, THYROIDAB in the last 72 hours.  Invalid input(s): FREET3 Anemia work up No results for input(s): VITAMINB12, FOLATE, FERRITIN, TIBC, IRON, RETICCTPCT in the last 72 hours. Microbiology Recent Results (from the past 240 hour(s))  Resp Panel by RT-PCR (Flu A&B, Covid) Nasopharyngeal Swab     Status: None   Collection Time: 07/20/21 10:33 PM   Specimen: Nasopharyngeal Swab; Nasopharyngeal(NP) swabs in vial transport medium  Result Value Ref Range Status   SARS Coronavirus 2 by RT PCR NEGATIVE NEGATIVE Final    Comment: (NOTE) SARS-CoV-2 target nucleic acids are NOT DETECTED.  The SARS-CoV-2 RNA is generally detectable in upper respiratory specimens during the  acute phase of infection. The lowest concentration of SARS-CoV-2 viral copies this assay can detect is 138 copies/mL. A negative result does not preclude SARS-Cov-2 infection and should not be used as the sole basis  for treatment or other patient management decisions. A negative result may occur with  improper specimen collection/handling, submission of specimen other than nasopharyngeal swab, presence of viral mutation(s) within the areas targeted by this assay, and inadequate number of viral copies(<138 copies/mL). A negative result must be combined with clinical observations, patient history, and epidemiological information. The expected result is Negative.  Fact Sheet for Patients:  EntrepreneurPulse.com.au  Fact Sheet for Healthcare Providers:  IncredibleEmployment.be  This test is no t yet approved or cleared by the [place] FDA and  has been authorized for detection and/or diagnosis of SARS-CoV-2 by FDA under an Emergency Use Authorization (EUA). This EUA will remain  in effect (meaning this test can be used) for the duration of the COVID-19 declaration under Section 564(b)(1) of the Act, 21 U.S.C.section 360bbb-3(b)(1), unless the authorization is terminated  or revoked sooner.       Influenza A by PCR NEGATIVE NEGATIVE Final   Influenza B by PCR NEGATIVE NEGATIVE Final    Comment: (NOTE) The Xpert Xpress SARS-CoV-2/FLU/RSV plus assay is intended as an aid in the diagnosis of influenza from Nasopharyngeal swab specimens and should not be used as a sole basis for treatment. Nasal washings and aspirates are unacceptable for Xpert Xpress SARS-CoV-2/FLU/RSV testing.  Fact Sheet for Patients: EntrepreneurPulse.com.au  Fact Sheet for Healthcare Providers: IncredibleEmployment.be  This test is not yet approved or cleared by the [place] FDA and has been authorized for detection and/or  diagnosis of SARS-CoV-2 by FDA under an Emergency Use Authorization (EUA). This EUA will remain in effect (meaning this test can be used) for the duration of the COVID-19 declaration under Section 564(b)(1) of the Act, 21 U.S.C. section 360bbb-3(b)(1), unless the authorization is terminated or revoked.  Performed at [facility], [address] [place], [place] [address]      Discharge Instructions:   Discharge Instructions     Diet - low sodium heart healthy   Complete by: As directed    Increase activity slowly   Complete by: As directed       Allergies as of 07/22/2021       Reactions   Penicillins Swelling   REACTION: nausea, swelling   Xifaxan [rifaximin] Other (See Comments), Rash   unknown   Ambien [zolpidem Tartrate]    " made her crazy "   Amoxicillin    REACTION: unspecified   Ceftin [cefuroxime Axetil] Diarrhea   Codeine Phosphate    REACTION: unspecified   Colchicine    Severe diarrhea   Levofloxacin Other (See Comments)   Tingle numbness   Lipitor [atorvastatin] Other (See Comments)   "makes legs jump all night"        Medication List     STOP taking these medications    hyoscyamine 0.125 MG/5ML Elix Commonly known as: LEVSIN       TAKE these medications    acetaminophen 325 MG tablet Commonly known as: TYLENOL Take 2 tablets (650 mg total) by mouth every 6 (six) hours as needed for mild pain (or Fever >/= 101).   amLODipine 2.5 MG tablet Commonly known as: NORVASC TAKE 1 TABLET BY MOUTH EVERY DAY   apixaban 2.5 MG Tabs tablet Commonly known as: Eliquis Take 1 tablet (2.5 mg total) by mouth 2 (two) times daily.   carvedilol 6.25 MG tablet Commonly known as: COREG Take 1 tablet (6.25 mg total) by mouth 2 (two) times daily.   hydrALAZINE 100 MG tablet Commonly known as: APRESOLINE Take  1 tablet (100 mg) in the AM and 1 tablet (100 mg) in the PM. What changed:  how much to take how to take this when to take this    hydrALAZINE 25 MG tablet Commonly known as: APRESOLINE Take 75 mg (3 tablets) daily at noon-3pm What changed:  how much to take how to take this when to take this   levothyroxine 88 MCG tablet Commonly known as: SYNTHROID TAKE 1 TABLET DAILY BEFORE BREAKFAST What changed:  how much to take how to take this when to take this   nitroGLYCERIN 0.4 MG SL tablet Commonly known as: NITROSTAT Place 1 tablet (0.4 mg total) under the tongue every 5 (five) minutes x 3 doses as needed for chest pain.   sertraline 100 MG tablet Commonly known as: ZOLOFT Take 1 tablet (100 mg total) by mouth daily.   temazepam 15 MG capsule Commonly known as: RESTORIL TAKE 2 CAPSULE BY MOUTH EVERY NIGHT AT BEDTIME AS NEEDED FOR SLEEP What changed:  how much to take how to take this when to take this           If you experience worsening of your admission symptoms, develop shortness of breath, life threatening emergency, suicidal or homicidal thoughts you must seek medical attention immediately by calling 911 or calling your MD immediately  if symptoms less severe.   You must read complete instructions/literature along with all the possible adverse reactions/side effects for all the medicines you take and that have been prescribed to you. Take any new medicines after you have completely understood and accept all the possible adverse reactions/side effects.    Please note   You were cared for by a hospitalist during your hospital stay. If you have any questions about your discharge medications or the care you received while you were in the hospital after you are discharged, you can call the unit and asked to speak with the hospitalist on call if the hospitalist that took care of you is not available. Once you are discharged, your primary care physician will handle any further medical issues. Please note that NO REFILLS for any discharge medications will be authorized once you are discharged, as it is  imperative that you return to your primary care physician (or establish a relationship with a primary care physician if you do not have one) for your aftercare needs so that they can reassess your need for medications and monitor your lab values.       Time coordinating discharge: 32 minutes  Signed:  Dona Walby  [facility] 07/22/2021, 3:31 PM   Pager on [email or web address]. If 7PM-7AM, please contact night-coverage at [email or web address]

## 2021-07-22 NOTE — Plan of Care (Signed)
  Problem: Safety: Goal: Ability to remain free from injury will improve Outcome: Completed/Met   Problem: Pain Managment: Goal: General experience of comfort will improve Outcome: Completed/Met   Problem: Elimination: Goal: Will not experience complications related to urinary retention Outcome: Completed/Met   

## 2021-07-24 ENCOUNTER — Encounter: Payer: Self-pay | Admitting: Family Medicine

## 2021-07-24 ENCOUNTER — Other Ambulatory Visit: Payer: Self-pay

## 2021-07-24 ENCOUNTER — Ambulatory Visit: Payer: Medicare PPO | Admitting: Family Medicine

## 2021-07-24 VITALS — BP 160/76 | HR 71 | Temp 98.0°F | Wt 116.0 lb

## 2021-07-24 DIAGNOSIS — E876 Hypokalemia: Secondary | ICD-10-CM

## 2021-07-24 DIAGNOSIS — I4819 Other persistent atrial fibrillation: Secondary | ICD-10-CM

## 2021-07-24 DIAGNOSIS — E038 Other specified hypothyroidism: Secondary | ICD-10-CM

## 2021-07-24 DIAGNOSIS — I130 Hypertensive heart and chronic kidney disease with heart failure and stage 1 through stage 4 chronic kidney disease, or unspecified chronic kidney disease: Secondary | ICD-10-CM

## 2021-07-24 DIAGNOSIS — F411 Generalized anxiety disorder: Secondary | ICD-10-CM | POA: Diagnosis not present

## 2021-07-24 DIAGNOSIS — I5032 Chronic diastolic (congestive) heart failure: Secondary | ICD-10-CM | POA: Diagnosis not present

## 2021-07-24 DIAGNOSIS — Z23 Encounter for immunization: Secondary | ICD-10-CM

## 2021-07-24 DIAGNOSIS — N182 Chronic kidney disease, stage 2 (mild): Secondary | ICD-10-CM | POA: Diagnosis not present

## 2021-07-24 MED ORDER — LORAZEPAM 0.5 MG PO TABS: 0.5000 mg | ORAL_TABLET | Freq: Four times a day (QID) | ORAL | 2 refills | Status: DC | PRN

## 2021-07-24 MED ORDER — HYDRALAZINE HCL 100 MG PO TABS: 100.0000 mg | ORAL_TABLET | Freq: Three times a day (TID) | ORAL | 5 refills | Status: DC

## 2021-07-24 NOTE — Progress Notes (Signed)
   Subjective:    Patient ID: MADI BAJRIC, female    DOB: [birth year], 87 y.o.   MRN: [id number]  HPI Here with her son to follow up a hospital stay from 07-20-21 to 07-22-21 for a hypertensive crisis. We have been struggling to manage her BP for some time., and for the weeks leading up to this admission her systolic BP would range from 120 to 200. We know that anxiety plays a role in this because she has become more and more anxious as she has gotten older. She takes Sertraline 100 mg daily. She usually sleeps well. The day of admission she was experiencing chest pains, SOB, and palpitions. Her BP on arrival was 214/88 and the pulse was almost 100. Other labs revealed her renal function to be stable (creatinine was 1.29) and her potassium was low at 3.4. her EKG remained normal, and cardiac enzymes were negative. A CXR revealed a small left pleural effusion. An ECHO showed an EF of 60-65% with normal diastolic relaxation. She also had a very small pericardial effusion. Her electrolytes were balanced and her BP came down quickly. Since her DC she has been staying at her son's house and she has felt fine except for persistent anxiety. Appetite and sleep are intact.    Review of Systems  Constitutional: Negative.   Respiratory: Negative.    Cardiovascular: Negative.   Neurological: Negative.   Psychiatric/Behavioral:  Negative for agitation, behavioral problems, confusion, decreased concentration, dysphoric mood and hallucinations. The patient is nervous/anxious.       Objective:   Physical Exam Constitutional:      Appearance: Normal appearance.     Comments: Frail, walks with a walker   Cardiovascular:     Rate and Rhythm: Normal rate and regular rhythm.     Pulses: Normal pulses.     Heart sounds: Normal heart sounds.  Pulmonary:     Effort: Pulmonary effort is normal.     Breath sounds: Normal breath sounds.  Musculoskeletal:     Right lower leg: No edema.     Left lower leg: No edema.   Neurological:     General: No focal deficit present.     Mental Status: She is alert and oriented to person, place, and time.  Psychiatric:        Mood and Affect: Mood normal.        Behavior: Behavior normal.        Thought Content: Thought content normal.          Assessment & Plan:  She recently had a hypertensive crisis which has resolved, but we still needto control the BP better. We will increase the Hydralazine to 100 mg TID. For her anxiety, we will add Lorazepam 0.5 mg to take every 6 hours as needed. We will check a BMET for the potassium and a thyroid panel. We will follow up with her tomorrow. I encouraged her to stay at her son's house at least for now. We spent 35 minutes reviewing records and discussing these issues.  [name], MD

## 2021-07-25 LAB — BASIC METABOLIC PANEL
BUN: 38 mg/dL — ABNORMAL HIGH (ref 6–23)
CO2: 30 mEq/L (ref 19–32)
Calcium: 9.3 mg/dL (ref 8.4–10.5)
Chloride: 100 mEq/L (ref 96–112)
Creatinine, Ser: 1.4 mg/dL — ABNORMAL HIGH (ref 0.40–1.20)
GFR: 32.12 mL/min — ABNORMAL LOW (ref >60.00)
Glucose, Bld: 95 mg/dL (ref 70–99)
Potassium: 4.7 mEq/L (ref 3.5–5.1)
Sodium: 141 mEq/L (ref 135–145)

## 2021-07-25 LAB — T4, FREE: Free T4: 1.1 ng/dL (ref 0.60–1.60)

## 2021-07-25 LAB — T3, FREE: T3, Free: 2.5 pg/mL (ref 2.3–4.2)

## 2021-07-27 ENCOUNTER — Telehealth: Payer: Self-pay | Admitting: Family Medicine

## 2021-07-27 NOTE — Telephone Encounter (Signed)
Attempted to call pt sent Dr Sarajane Jews advise through MyChart portal

## 2021-07-27 NOTE — Telephone Encounter (Signed)
I would rather they get the Lorazepam using [facility]. This is the safest medication for her

## 2021-07-27 NOTE — Telephone Encounter (Signed)
PT daughter called to advise that when Dr.Fry called in LORazepam (ATIVAN) 0.5 MG tablet she found out that the insurance wont cover it. She would like to see if there is something else Dr.Fry can prescribe. If there is nothing else then they can probably use good rx to get it. Please advise.

## 2021-08-07 ENCOUNTER — Other Ambulatory Visit: Payer: Self-pay

## 2021-08-07 ENCOUNTER — Ambulatory Visit: Payer: Medicare PPO | Admitting: Cardiology

## 2021-08-07 ENCOUNTER — Encounter: Payer: Self-pay | Admitting: Cardiology

## 2021-08-07 VITALS — BP 120/70 | HR 62 | Ht 62.0 in | Wt 109.8 lb

## 2021-08-07 DIAGNOSIS — Z7901 Long term (current) use of anticoagulants: Secondary | ICD-10-CM | POA: Diagnosis not present

## 2021-08-07 DIAGNOSIS — R0609 Other forms of dyspnea: Secondary | ICD-10-CM

## 2021-08-07 DIAGNOSIS — N182 Chronic kidney disease, stage 2 (mild): Secondary | ICD-10-CM

## 2021-08-07 DIAGNOSIS — I5032 Chronic diastolic (congestive) heart failure: Secondary | ICD-10-CM | POA: Diagnosis not present

## 2021-08-07 DIAGNOSIS — R0600 Dyspnea, unspecified: Secondary | ICD-10-CM

## 2021-08-07 DIAGNOSIS — I130 Hypertensive heart and chronic kidney disease with heart failure and stage 1 through stage 4 chronic kidney disease, or unspecified chronic kidney disease: Secondary | ICD-10-CM | POA: Diagnosis not present

## 2021-08-07 DIAGNOSIS — I4819 Other persistent atrial fibrillation: Secondary | ICD-10-CM | POA: Diagnosis not present

## 2021-08-07 MED ORDER — HYDRALAZINE HCL 100 MG PO TABS: 150.0000 mg | ORAL_TABLET | Freq: Two times a day (BID) | ORAL | 5 refills | Status: DC

## 2021-08-07 NOTE — Patient Outreach (Signed)
[facility]) Care Management  08/07/2021  [name] 30-May-1924 [id number]   Telephone Assessment    Unsuccessful outreach attempt to patient.   Plan: RN CM will make outreach attempt to patient within the month of Oct.  [name] [name] [name] Management Telephonic Care Management Coordinator Direct Phone: [phone] Toll Free: [phone] Fax: [phone]

## 2021-08-07 NOTE — Patient Instructions (Addendum)
Medication Instructions:   Change directions   Take Hydralazine 150 mg ( 1 and 1/2 tablets  of 100 mg)  twice a day  Only take blood pressure reading if you are feeling any symptoms --if you are close to taking next does of medication  then take it at that time, if no then take  additional dose Amlodipine    *If you need a refill on your cardiac medications before your next appointment, please call your pharmacy*   Lab Work: Not needed .   Testing/Procedures: Not needed   Follow-Up: At [facility], you and your health needs are our priority.  As part of our continuing mission to provide you with exceptional heart care, we have created designated Provider Care Teams.  These Care Teams include your primary Cardiologist (physician) and Advanced Practice Providers (APPs -  Physician Assistants and Nurse Practitioners) who all work together to provide you with the care you need, when you need it.     Your next appointment:   6 month(s)  The format for your next appointment:   In Person  Provider:   Glenetta Hew, MD

## 2021-08-07 NOTE — Progress Notes (Signed)
Primary Care Provider: [name], MD Cardiologist: Glenetta Hew, MD Electrophysiologist: None  Clinic Note: Chief Complaint  Patient presents with   Hospitalization Follow-up    Admitted for chest pain and hypertension.   Labile hypertension    Blood pressure log reviewed.   ===================================  ASSESSMENT/PLAN   Problem List Items Addressed This Visit       Cardiology Problems   Persistent Paroxysmal atrial fibrillation: CHA2DS2-VASc Score 5, on Eliquis (Chronic)    Pretty much ventricular paced rhythm.  Has not had any issues with A. fib. Remains on carvedilol 6.25 mg twice daily.  This could be increased if necessary for blood pressure control. Remains on apixaban with no bleeding issues.  No CVA/TIA symptoms.      Relevant Medications   hydrALAZINE (APRESOLINE) 100 MG tablet   Other Relevant Orders   EKG 12-Lead (Completed)   Hypertensive heart and chronic kidney disease with chronic diastolic congestive heart failure ([place]) - Primary (Chronic)    Not actively having heart failure symptoms.  Her blood pressure does go up high, but not staying up high enough for her to have significant CHF symptoms.  I would be very leery of using a diuretic given her advanced age and tendency for dehydration.  Her hydralazine was increased to 100 mg 3 times daily-I am switching it to 150 mg twice daily for ease of use..  Thankfully, no recent hypotension issues.  I spent a long time talking with him again about this major issue.  I explained to them I am probably more worried about hypotension than hypertension.  Her pressures are running pretty high but she is 87 years old.  We need to be very careful and not be overly aggressive treating her blood pressure.  Intermittent pressures in the 190s and 200s are pretty much acceptable unless she is symptomatic.  When they are at that elevated, she can take an extra dose of amlodipine.  High doses of standing medications  are difficult because of her issues with hypotension.  We discussed as needed options for high blood pressures-recheck in 1 hour, if still elevated take next dose of upcoming medication, if no dose due in the near future, take additional amlodipine 2.5 mg.      Relevant Medications   hydrALAZINE (APRESOLINE) 100 MG tablet   Other Relevant Orders   EKG 12-Lead (Completed)     Other   Chronic anticoagulation- Eliquis (Chronic)    No bleeding issues on Eliquis.  She is on 2.5 mg twice daily which is appropriate for her age and weight and renal function.      Dyspnea on exertion (Chronic)    Probably deconditioning.  Not really having an issue with this now despite having high blood pressures.      ===================================  HPI:    [name] is a 87 y.o. female with a PMH below who presents today for post hospital and routine follow-up-accompanied by her her son [name]..  PMH: Longstanding A. fib (history tachybradycardia and CHB-syncope, s/p PPM), HFpEF as well as Labile/Difficult to Control HTN Complicated by Orthostatic Hypotension.  She is usually accompanied by either her son [name] or his wife [name] was last seen on 02/01/2021: Doing relatively well.  No chest pain or pressure just some mild musculoskeletal pains.  Unsteady gait.  No syncope or near syncope.  Not using Lasix every day. => No major changes made simply recommended using as needed hydralazine for a significant  hypertension and to hold the dose if her blood pressure is less than 120 mmHg.  Recent Hospitalizations:  9/9-09/2021: Admitted with chest pressure, dyspnea, palpitations and anxiety.  Blood pressure was 214/88.  She was admitted for hypertensive urgency.  ACS ruled out.  Thought to be partly related to significant social anxiety.  He was seen by Dr. Sarajane Jews for hospital follow-up on 07/24/2021.  They noted that since discharge, she had been staying at her son's house and was feeling  well with exception of anxiety.  Appetite and sleep are okay.  Blood pressures been more stable. => He had a as needed lorazepam and increase hydralazine to 100 mg 3 times daily.  Reviewed  CV studies:    The following studies were reviewed today: (if available, images/films reviewed: From Epic Chart or Care Everywhere) 2D Echo 07/21/2021: EF 60 to 65%.  No RWM A.  Severe LA dilation.  Mild RA dilation.  Mildly elevated PAP.  Mild aortic sclerosis with no stenosis.  Interval History:   [name] presents here today with her son.  She has been doing okay.  She says she feels dizzy after standing for long period time.  She has off-and-on musculoskeletal pains in her upper chest and shoulders.  Basely she says that her anxiety will build up and she starts getting very anxious and that that her blood pressure go up.  She does have some exertional dyspnea but for the most part she is just extremely anxious.  She is now living at her son's house and is worried about being a nuisance and getting in their way.  She likes to be back in her own house, but understands that it would be very difficult for her to be back there by herself.  She has not any further chest pain or pressure.  She brings with her a blood pressure log dating back to July. July 20-BP 210/106 at 11 AM then slowly went down to 194/82 and then to 178/92 by 9 PM at night. July 21: 154/66; July 22 to 21/106 mmHg; July 23-197/97, 7/24 145/70 => August 30: 164/86-numbers recorded from 142/63-1 94/95.  One time it was 211/95 on 912 which was when she returned from the hospital. Since discharge 9/13: Blood pressures have for the most part been 146/62-155/73 but as high as 190/90, 198/90, 202/90 and then most recently on 9/26 205/95.  CV Review of Symptoms (Summary) Cardiovascular ROS: positive for - irregular heartbeat and fluctuating blood pressures as noted, but not really associated with headache blurred vision.  Actually she gets  dizziness with standing up.  No further chest pain or pressure.  No exertional dyspnea. negative for - chest pain, dyspnea on exertion, orthopnea, palpitations, paroxysmal nocturnal dyspnea, rapid heart rate, shortness of breath, or syncope or near syncope, TIA/amaurosis fugax, claudication  REVIEWED OF SYSTEMS   Review of Systems  Constitutional:  Positive for malaise/fatigue (Feels really tired because she does not sleep well.). Negative for chills, fever and weight loss (Maintaining stable weight).  HENT:  Negative for congestion and nosebleeds.   Respiratory:  Negative for cough and shortness of breath.   Gastrointestinal:  Negative for blood in stool and melena.  Genitourinary:  Negative for hematuria.  Musculoskeletal:  Positive for joint pain. Negative for falls.  Neurological:  Positive for dizziness and weakness (Generalized). Negative for headaches (Not really, only when blood pressure is very high.).  Psychiatric/Behavioral:  Positive for depression and memory loss. The patient is nervous/anxious and has insomnia.  I have reviewed and (if needed) personally updated the patient's problem list, medications, allergies, past medical and surgical history, social and family history.   PAST MEDICAL HISTORY   Past Medical History:  Diagnosis Date   Allergy    Anemia    Anxiety    Aortic atherosclerosis ([place]) 07/21/2021   Arthritis    Asthma    Benign positional vertigo    Blood transfusion without reported diagnosis    Cardiac pacemaker in situ 05/01/2015   for syncope & Sx Bradycardia -> Medtronic MRI compatible pacemaker placed Dr. Caryl Comes    Cataract    Chronic diarrhea    Chronic kidney disease, stage IV (severe) (HCC)    Colon polyps    Diastolic dysfunction, left ventricle 11/03/2013   Diverticulosis    Esophageal stricture    GERD (gastroesophageal reflux disease)    Gout    Heart murmur    Hemorrhoids    Hyperlipidemia    Hypertensive heart disease    no  significant RAS by MRA 2002- (<30% LRAS)    Hypothyroidism    IBS (irritable bowel syndrome)    LBBB (left bundle branch block)- new 11/01/13 11/01/2013   Paroxysmal atrial fibrillation 11/01/2013   Intermittent through the years and recurrent in December of 2014 treated with amiodarone;; Negative Myoview 10/2013   Peripheral vascular disease    123456 LICA, [id number] RICA by doppler 2009    Sinus arrest 04/28/2015   Syncope and collapse 04/28/2015   s/p PPM   Tubulovillous adenoma 4/07    PAST SURGICAL HISTORY   Past Surgical History:  Procedure Laterality Date   APPENDECTOMY     CATARACT EXTRACTION     EP IMPLANTABLE DEVICE N/A 05/01/2015   Procedure: Pacemaker Implant;  Surgeon: Deboraha Sprang, MD;  Location: [facility];  Service: Cardiovascular;  Laterality: N/A;   HIP ARTHROPLASTY Left 05/01/2015   Procedure: ARTHROPLASTY BIPOLAR HIP (HEMIARTHROPLASTY);  Surgeon: Rod Can, MD;  Location: [facility];  Service: Orthopedics;  Laterality: Left;   LOW ANTERIOR BOWEL RESECTION  7/08   LUMBAR [place] SURGERY  2000   NM MYOVIEW LTD  03/18/2013   Negative for ischemia or infarction. EF 55%.  LOW RISK   OOPHORECTOMY  1962   right   squamous cell skin cancer removed     TRANSTHORACIC ECHOCARDIOGRAM  10/2013, 02/2017   A) EF 55-60%, mild LVH, elevated bili pressures. Mild aortic valve calcification;; B) Normal LV size and function with mild LVH. Unable to assess diastolic function. Normal PA pressures. Mild MR,Mild AI    Immunization History  Administered Date(s) Administered   Fluad Quad(high Dose 65+) 08/31/2020, 07/24/2021   Influenza Split 09/09/2011, 08/04/2012   Influenza Whole 08/30/2009, 09/06/2010   Influenza, High Dose Seasonal PF 08/21/2016, 09/01/2017, 09/01/2018   Influenza,inj,Quad PF,6+ Mos 08/31/2013, 09/07/2014   Influenza-Unspecified 09/14/2015   PFIZER(Purple Top)SARS-COV-2 Vaccination 11/30/2019, 12/21/2019, 12/05/2020   Pneumococcal Polysaccharide-23 09/01/2008    Tdap 04/28/2015, 01/11/2017   Zoster, Live 09/17/2012    MEDICATIONS/ALLERGIES   Current Meds  Medication Sig   acetaminophen (TYLENOL) 325 MG tablet Take 2 tablets (650 mg total) by mouth every 6 (six) hours as needed for mild pain (or Fever >/= 101).   amLODipine (NORVASC) 2.5 MG tablet TAKE 1 TABLET BY MOUTH EVERY DAY (Patient taking differently: Take 2.5 mg by mouth daily.)   apixaban (ELIQUIS) 2.5 MG TABS tablet Take 1 tablet (2.5 mg total) by mouth 2 (two) times daily.   carvedilol (COREG) 6.25 MG  tablet Take 1 tablet (6.25 mg total) by mouth 2 (two) times daily.   levothyroxine (SYNTHROID) 88 MCG tablet TAKE 1 TABLET DAILY BEFORE BREAKFAST (Patient taking differently: Take 88 mcg by mouth See admin instructions. TAKE 1 TABLET DAILY BEFORE BREAKFAST)   nitroGLYCERIN (NITROSTAT) 0.4 MG SL tablet Place 1 tablet (0.4 mg total) under the tongue every 5 (five) minutes x 3 doses as needed for chest pain.   sertraline (ZOLOFT) 100 MG tablet Take 1 tablet (100 mg total) by mouth daily.   hydrALAZINE (APRESOLINE) 100 MG tablet Take 1 tablet (100 mg total) by mouth 3 (three) times daily. Take 1 tablet (100 mg) in the AM and 1 tablet (100 mg) in the PM.   [DISCONTINUED] LORazepam (ATIVAN) 0.5 MG tablet Take 1 tablet (0.5 mg total) by mouth every 6 (six) hours as needed for anxiety.   [DISCONTINUED] temazepam (RESTORIL) 15 MG capsule TAKE 2 CAPSULE BY MOUTH EVERY NIGHT AT BEDTIME AS NEEDED FOR SLEEP (Patient taking differently: Take 30 mg by mouth See admin instructions. TAKE 2 CAPSULE BY MOUTH EVERY NIGHT AT BEDTIME AS NEEDED FOR SLEEP)    Allergies  Allergen Reactions   Penicillins Swelling    REACTION: nausea, swelling   Xifaxan [Rifaximin] Other (See Comments) and Rash    unknown   Ambien [Zolpidem Tartrate]     " made her crazy "   Amoxicillin     REACTION: unspecified   Ceftin [Cefuroxime Axetil] Diarrhea   Codeine Phosphate     REACTION: unspecified   Colchicine     Severe  diarrhea   Levofloxacin Other (See Comments)    Tingle numbness   Lipitor [Atorvastatin] Other (See Comments)    "makes legs jump all night"    SOCIAL HISTORY/FAMILY HISTORY   Reviewed in Epic:  Pertinent findings:  Social History   Tobacco Use   Smoking status: Never   Smokeless tobacco: Never   Tobacco comments:    never used tobacco  Vaping Use   Vaping Use: Never used  Substance Use Topics   Alcohol use: No    Alcohol/week: 0.0 standard drinks   Drug use: No   Social History   Social History Narrative   She is a widowed mother of 2, one child is deceased. Grandmother of one. She appears to be working out routinely at [employer] using [employer]. She is otherwise quite active.   She does not drink alcohol, does not smoke.  She is retired from [employer].       She lives alone in her condo.  Currently now living with her son and daughter-in-law.  Had recently been on hospice due to failure to thrive secondary to significant back pain.  Pain significantly improved with the steroids, and she has perked up some with notable weight gain, but still remains very tenuous.  Not likely able to go back to living alone.  Having a difficult time dealing with ramifications of her limited mobility and requirement for significant pain medications.      Very emotionally stressful.  She acknowledges that this is not how she would want to live the rest of her life.    OBJCTIVE -PE, EKG, labs   Wt Readings from Last 3 Encounters:  08/19/21 110 lb 0.2 oz (49.9 kg)  08/07/21 109 lb 12.8 oz (49.8 kg)  07/24/21 116 lb (52.6 kg)    Physical Exam: BP 120/70 (BP Location: Left Arm)   Pulse 62   Ht 5'  2" (1.575 m)   Wt 109 lb 12.8 oz (49.8 kg)   SpO2 95%   BMI 20.08 kg/m  Physical Exam Vitals reviewed.  Constitutional:      General: She is not in acute distress.    Appearance: She is normal weight. She is not ill-appearing or toxic-appearing.     Comments:  Frail, elderly woman.  Very anxious but no obvious distress.  HENT:     Head: Normocephalic and atraumatic.  Eyes:     Comments: Very unusual eye exam.  Stable.  Neck:     Vascular: No carotid bruit or JVD.  Cardiovascular:     Rate and Rhythm: Normal rate and regular rhythm.     Chest Wall: PMI is not displaced.     Pulses: Normal pulses.     Heart sounds: Murmur (Harsh [id number] midsystolic SEM at RUSB-neck) heard.    No friction rub. No gallop.     Comments: Normal S1, S2. Pulmonary:     Effort: Pulmonary effort is normal. No respiratory distress.     Breath sounds: Normal breath sounds. No rhonchi.  Chest:     Chest wall: Tenderness present.  Musculoskeletal:     Cervical back: Normal range of motion and neck supple.  Neurological:     General: No focal deficit present.     Mental Status: She is alert and oriented to person, place, and time.     Gait: Gait abnormal.     Comments: Not sure about her insight.  Psychiatric:     Comments: Very anxious.  Almost sad but not tearful.  Starting to have a sensation of hopelessness.    Adult ECG Report  Rate: 62 ;  Rhythm:  Ventricular paced rhythm with PVC ;   Narrative Interpretation: Stable  Recent Labs: Reviewed.  No recent lipids. Lab Results  Component Value Date   CHOL 159 11/02/2013   HDL 47 11/02/2013   LDLCALC 90 11/02/2013   LDLDIRECT 142.3 09/08/2013   TRIG 111 11/02/2013   CHOLHDL 3.4 11/02/2013   Discharge labs: Cr 1.4, K+ 4.7. ->  Creatinine was 1.13 on admission. ==================================================  COVID-19 Education: The signs and symptoms of COVID-19 were discussed with the patient and how to seek care for testing (follow up with PCP or arrange E-visit).    I spent a total of 29 minutes with the patient spent in direct patient consultation.  Additional time spent with chart review  / charting (studies, outside notes, etc): 17 min Total Time: 46 min  Current medicines are reviewed at length  with the patient today.  (+/- concerns) none  This visit occurred during the SARS-CoV-2 public health emergency.  Safety protocols were in place, including screening questions prior to the visit, additional usage of staff PPE, and extensive cleaning of exam room while observing appropriate contact time as indicated for disinfecting solutions.  Notice: This dictation was prepared with Dragon dictation along with smart phrase technology. Any transcriptional errors that result from this process are unintentional and may not be corrected upon review.  Patient Instructions / Medication Changes & Studies & Tests Ordered   Patient Instructions  Medication Instructions:   Change directions   Take Hydralazine 150 mg ( 1 and 1/2 tablets  of 100 mg)  twice a day  Only take blood pressure reading if you are feeling any symptoms --if you are close to taking next does of medication  then take it at that time, if no then take  additional dose  Amlodipine    *If you need a refill on your cardiac medications before your next appointment, please call your pharmacy*   Lab Work: Not needed .   Testing/Procedures: Not needed   Follow-Up: At [facility], you and your health needs are our priority.  As part of our continuing mission to provide you with exceptional heart care, we have created designated Provider Care Teams.  These Care Teams include your primary Cardiologist (physician) and Advanced Practice Providers (APPs -  Physician Assistants and Nurse Practitioners) who all work together to provide you with the care you need, when you need it.     Your next appointment:   6 month(s)  The format for your next appointment:   In Person  Provider:   Glenetta Hew, MD     Studies Ordered:   Orders Placed This Encounter  Procedures   EKG 12-Lead     Glenetta Hew, M.D., M.S. Interventional Cardiologist   Pager # [phone] Phone # [phone] [address]. [place], [place] [address]   Thank you for choosing Heart[facility] at [facility]!!

## 2021-08-08 ENCOUNTER — Ambulatory Visit: Payer: Self-pay

## 2021-08-17 ENCOUNTER — Telehealth: Payer: Self-pay

## 2021-08-17 NOTE — Telephone Encounter (Signed)
[name] daughter in law of patient called asking for a call back to discuss getting pt skilled nursing referral pt is sleeping all day and not eating well and slowly declining with health. Call back # [phone]

## 2021-08-17 NOTE — Telephone Encounter (Signed)
Called and spoke with Butch Penny, patient DIL.     She stated that patients health is slowly deteriorating, she has not been getting out of bed lately, eating very little, and has no energy.   She has been sleeping all day, and has been SOB.   Butch Penny checked her O2 saturation rate on one hand it was 75 and on the other it was 81.       Butch Penny said she is going to take the patient to Urgent Care due to being SOB.    She also is going to check with patients insurance to find out if they will be willing to have home health come into the home to give patient 24 hour care or in the past patient had Hospice Care.      Butch Penny said she will contact the office once she finds out what home care services the  insurance will be willing  to pay for and will notify the office with her decision.

## 2021-08-18 ENCOUNTER — Inpatient Hospital Stay (HOSPITAL_COMMUNITY)
Admission: EM | Admit: 2021-08-18 | Discharge: 2021-08-23 | DRG: 682 | Disposition: A | Discharge status: Skilled Nursing Facility | Payer: Medicare PPO | Attending: Internal Medicine | Admitting: Internal Medicine

## 2021-08-18 ENCOUNTER — Other Ambulatory Visit: Payer: Self-pay

## 2021-08-18 ENCOUNTER — Emergency Department (HOSPITAL_COMMUNITY): Payer: Medicare PPO

## 2021-08-18 DIAGNOSIS — Z888 Allergy status to other drugs, medicaments and biological substances status: Secondary | ICD-10-CM

## 2021-08-18 DIAGNOSIS — R0989 Other specified symptoms and signs involving the circulatory and respiratory systems: Secondary | ICD-10-CM | POA: Diagnosis not present

## 2021-08-18 DIAGNOSIS — R4182 Altered mental status, unspecified: Secondary | ICD-10-CM | POA: Diagnosis not present

## 2021-08-18 DIAGNOSIS — R778 Other specified abnormalities of plasma proteins: Secondary | ICD-10-CM | POA: Diagnosis not present

## 2021-08-18 DIAGNOSIS — I129 Hypertensive chronic kidney disease with stage 1 through stage 4 chronic kidney disease, or unspecified chronic kidney disease: Secondary | ICD-10-CM | POA: Diagnosis present

## 2021-08-18 DIAGNOSIS — I959 Hypotension, unspecified: Secondary | ICD-10-CM | POA: Diagnosis present

## 2021-08-18 DIAGNOSIS — E860 Dehydration: Secondary | ICD-10-CM | POA: Diagnosis present

## 2021-08-18 DIAGNOSIS — J9811 Atelectasis: Secondary | ICD-10-CM | POA: Diagnosis present

## 2021-08-18 DIAGNOSIS — K219 Gastro-esophageal reflux disease without esophagitis: Secondary | ICD-10-CM | POA: Diagnosis present

## 2021-08-18 DIAGNOSIS — F419 Anxiety disorder, unspecified: Secondary | ICD-10-CM | POA: Diagnosis present

## 2021-08-18 DIAGNOSIS — Z96642 Presence of left artificial hip joint: Secondary | ICD-10-CM | POA: Diagnosis present

## 2021-08-18 DIAGNOSIS — I248 Other forms of acute ischemic heart disease: Secondary | ICD-10-CM | POA: Diagnosis present

## 2021-08-18 DIAGNOSIS — R0602 Shortness of breath: Secondary | ICD-10-CM

## 2021-08-18 DIAGNOSIS — J449 Chronic obstructive pulmonary disease, unspecified: Secondary | ICD-10-CM | POA: Diagnosis present

## 2021-08-18 DIAGNOSIS — R531 Weakness: Secondary | ICD-10-CM

## 2021-08-18 DIAGNOSIS — F32A Depression, unspecified: Secondary | ICD-10-CM | POA: Diagnosis present

## 2021-08-18 DIAGNOSIS — Z823 Family history of stroke: Secondary | ICD-10-CM

## 2021-08-18 DIAGNOSIS — E785 Hyperlipidemia, unspecified: Secondary | ICD-10-CM | POA: Diagnosis present

## 2021-08-18 DIAGNOSIS — Z20822 Contact with and (suspected) exposure to covid-19: Secondary | ICD-10-CM | POA: Diagnosis present

## 2021-08-18 DIAGNOSIS — I700 Atherosclerosis of aorta: Secondary | ICD-10-CM | POA: Diagnosis present

## 2021-08-18 DIAGNOSIS — I130 Hypertensive heart and chronic kidney disease with heart failure and stage 1 through stage 4 chronic kidney disease, or unspecified chronic kidney disease: Secondary | ICD-10-CM | POA: Diagnosis not present

## 2021-08-18 DIAGNOSIS — Z66 Do not resuscitate: Secondary | ICD-10-CM | POA: Diagnosis present

## 2021-08-18 DIAGNOSIS — G9341 Metabolic encephalopathy: Secondary | ICD-10-CM | POA: Diagnosis present

## 2021-08-18 DIAGNOSIS — J9601 Acute respiratory failure with hypoxia: Secondary | ICD-10-CM | POA: Diagnosis present

## 2021-08-18 DIAGNOSIS — I2510 Atherosclerotic heart disease of native coronary artery without angina pectoris: Secondary | ICD-10-CM | POA: Diagnosis not present

## 2021-08-18 DIAGNOSIS — N189 Chronic kidney disease, unspecified: Secondary | ICD-10-CM

## 2021-08-18 DIAGNOSIS — E039 Hypothyroidism, unspecified: Secondary | ICD-10-CM | POA: Diagnosis present

## 2021-08-18 DIAGNOSIS — E43 Unspecified severe protein-calorie malnutrition: Secondary | ICD-10-CM | POA: Diagnosis present

## 2021-08-18 DIAGNOSIS — Z880 Allergy status to penicillin: Secondary | ICD-10-CM

## 2021-08-18 DIAGNOSIS — E038 Other specified hypothyroidism: Secondary | ICD-10-CM | POA: Diagnosis not present

## 2021-08-18 DIAGNOSIS — I739 Peripheral vascular disease, unspecified: Secondary | ICD-10-CM | POA: Diagnosis present

## 2021-08-18 DIAGNOSIS — Z7901 Long term (current) use of anticoagulants: Secondary | ICD-10-CM

## 2021-08-18 DIAGNOSIS — N184 Chronic kidney disease, stage 4 (severe): Secondary | ICD-10-CM | POA: Diagnosis present

## 2021-08-18 DIAGNOSIS — R627 Adult failure to thrive: Secondary | ICD-10-CM | POA: Diagnosis present

## 2021-08-18 DIAGNOSIS — I5032 Chronic diastolic (congestive) heart failure: Secondary | ICD-10-CM | POA: Diagnosis not present

## 2021-08-18 DIAGNOSIS — R0902 Hypoxemia: Secondary | ICD-10-CM

## 2021-08-18 DIAGNOSIS — Z8249 Family history of ischemic heart disease and other diseases of the circulatory system: Secondary | ICD-10-CM

## 2021-08-18 DIAGNOSIS — Z79899 Other long term (current) drug therapy: Secondary | ICD-10-CM

## 2021-08-18 DIAGNOSIS — M1990 Unspecified osteoarthritis, unspecified site: Secondary | ICD-10-CM | POA: Diagnosis not present

## 2021-08-18 DIAGNOSIS — I10 Essential (primary) hypertension: Secondary | ICD-10-CM | POA: Diagnosis not present

## 2021-08-18 DIAGNOSIS — J90 Pleural effusion, not elsewhere classified: Secondary | ICD-10-CM | POA: Diagnosis not present

## 2021-08-18 DIAGNOSIS — Z743 Need for continuous supervision: Secondary | ICD-10-CM | POA: Diagnosis not present

## 2021-08-18 DIAGNOSIS — R1312 Dysphagia, oropharyngeal phase: Secondary | ICD-10-CM | POA: Diagnosis not present

## 2021-08-18 DIAGNOSIS — F418 Other specified anxiety disorders: Secondary | ICD-10-CM | POA: Diagnosis not present

## 2021-08-18 DIAGNOSIS — J811 Chronic pulmonary edema: Secondary | ICD-10-CM | POA: Diagnosis not present

## 2021-08-18 DIAGNOSIS — Z6820 Body mass index (BMI) 20.0-20.9, adult: Secondary | ICD-10-CM

## 2021-08-18 DIAGNOSIS — I4819 Other persistent atrial fibrillation: Secondary | ICD-10-CM | POA: Diagnosis present

## 2021-08-18 DIAGNOSIS — Z79890 Hormone replacement therapy (postmenopausal): Secondary | ICD-10-CM

## 2021-08-18 DIAGNOSIS — I517 Cardiomegaly: Secondary | ICD-10-CM | POA: Diagnosis not present

## 2021-08-18 DIAGNOSIS — R410 Disorientation, unspecified: Secondary | ICD-10-CM | POA: Diagnosis not present

## 2021-08-18 DIAGNOSIS — N179 Acute kidney failure, unspecified: Principal | ICD-10-CM

## 2021-08-18 DIAGNOSIS — I447 Left bundle-branch block, unspecified: Secondary | ICD-10-CM | POA: Diagnosis present

## 2021-08-18 LAB — CBC WITH DIFFERENTIAL/PLATELET
Abs Immature Granulocytes: 0.21 10*3/uL — ABNORMAL HIGH (ref 0.00–0.07)
Basophils Absolute: 0 10*3/uL (ref 0.0–0.1)
Basophils Relative: 1 %
Eosinophils Absolute: 0 10*3/uL (ref 0.0–0.5)
Eosinophils Relative: 0 %
HCT: 46 % (ref 36.0–46.0)
Hemoglobin: 14.3 g/dL (ref 12.0–15.0)
Immature Granulocytes: 5 %
Lymphocytes Relative: 16 %
Lymphs Abs: 0.7 10*3/uL (ref 0.7–4.0)
MCH: 31.5 pg (ref 26.0–34.0)
MCHC: 31.1 g/dL (ref 30.0–36.0)
MCV: 101.3 fL — ABNORMAL HIGH (ref 80.0–100.0)
Monocytes Absolute: 0.3 10*3/uL (ref 0.1–1.0)
Monocytes Relative: 6 %
Neutro Abs: 3.2 10*3/uL (ref 1.7–7.7)
Neutrophils Relative %: 72 %
Platelets: 160 10*3/uL (ref 150–400)
RBC: 4.54 MIL/uL (ref 3.87–5.11)
RDW: 15.4 % (ref 11.5–15.5)
WBC: 4.4 10*3/uL (ref 4.0–10.5)
nRBC: 0 % (ref 0.0–0.2)

## 2021-08-18 LAB — COMPREHENSIVE METABOLIC PANEL
ALT: 43 U/L (ref 0–44)
AST: 69 U/L — ABNORMAL HIGH (ref 15–41)
Albumin: 4.5 g/dL (ref 3.5–5.0)
Alkaline Phosphatase: 54 U/L (ref 38–126)
Anion gap: 12 (ref 5–15)
BUN: 42 mg/dL — ABNORMAL HIGH (ref 8–23)
CO2: 30 mmol/L (ref 22–32)
Calcium: 8.9 mg/dL (ref 8.9–10.3)
Chloride: 95 mmol/L — ABNORMAL LOW (ref 98–111)
Creatinine, Ser: 2.04 mg/dL — ABNORMAL HIGH (ref 0.44–1.00)
GFR, Estimated: 22 mL/min — ABNORMAL LOW (ref >60)
Glucose, Bld: 104 mg/dL — ABNORMAL HIGH (ref 70–99)
Potassium: 4.1 mmol/L (ref 3.5–5.1)
Sodium: 137 mmol/L (ref 135–145)
Total Bilirubin: 1 mg/dL (ref 0.3–1.2)
Total Protein: 7.6 g/dL (ref 6.5–8.1)

## 2021-08-18 LAB — LIPASE, BLOOD: Lipase: 42 U/L (ref 11–51)

## 2021-08-18 LAB — BRAIN NATRIURETIC PEPTIDE
B Natriuretic Peptide: 1115.6 pg/mL — ABNORMAL HIGH (ref 0.0–100.0)
B Natriuretic Peptide: 1277.1 pg/mL — ABNORMAL HIGH (ref 0.0–100.0)

## 2021-08-18 LAB — TROPONIN I (HIGH SENSITIVITY)
Troponin I (High Sensitivity): 59 ng/L — ABNORMAL HIGH (ref <18)
Troponin I (High Sensitivity): 78 ng/L — ABNORMAL HIGH (ref <18)

## 2021-08-18 LAB — TSH: TSH: 1.828 u[IU]/mL (ref 0.350–4.500)

## 2021-08-18 LAB — RESP PANEL BY RT-PCR (FLU A&B, COVID) ARPGX2
Influenza A by PCR: NEGATIVE
Influenza B by PCR: NEGATIVE
SARS Coronavirus 2 by RT PCR: NEGATIVE

## 2021-08-18 MED ORDER — APIXABAN 2.5 MG PO TABS
2.5000 mg | ORAL_TABLET | Freq: Two times a day (BID) | ORAL | Status: DC
  Administered 2021-08-18 – 2021-08-23 (×10): 2.5 mg via ORAL
  Filled 2021-08-18 (×10): qty 1

## 2021-08-18 MED ORDER — AMLODIPINE BESYLATE 5 MG PO TABS
2.5000 mg | ORAL_TABLET | Freq: Every day | ORAL | Status: DC
  Administered 2021-08-18 – 2021-08-23 (×6): 2.5 mg via ORAL
  Filled 2021-08-18 (×6): qty 1

## 2021-08-18 MED ORDER — NITROGLYCERIN 0.4 MG SL SUBL: 0.4000 mg | SUBLINGUAL_TABLET | SUBLINGUAL | Status: DC | PRN

## 2021-08-18 MED ORDER — LORAZEPAM 0.5 MG PO TABS: 0.5000 mg | ORAL_TABLET | Freq: Four times a day (QID) | ORAL | Status: DC | PRN

## 2021-08-18 MED ORDER — CARVEDILOL 6.25 MG PO TABS
6.2500 mg | ORAL_TABLET | Freq: Two times a day (BID) | ORAL | Status: DC
  Administered 2021-08-18 – 2021-08-23 (×8): 6.25 mg via ORAL
  Filled 2021-08-18 (×9): qty 1

## 2021-08-18 MED ORDER — TEMAZEPAM 15 MG PO CAPS: 30.0000 mg | ORAL_CAPSULE | Freq: Every evening | ORAL | Status: DC | PRN

## 2021-08-18 MED ORDER — DEXTROSE 5 % IV SOLN: INTRAVENOUS | Status: DC

## 2021-08-18 MED ORDER — ACETAMINOPHEN 650 MG RE SUPP: 650.0000 mg | Freq: Four times a day (QID) | RECTAL | Status: DC | PRN

## 2021-08-18 MED ORDER — ACETAMINOPHEN 325 MG PO TABS
650.0000 mg | ORAL_TABLET | Freq: Four times a day (QID) | ORAL | Status: DC | PRN
  Administered 2021-08-18 – 2021-08-22 (×3): 650 mg via ORAL
  Filled 2021-08-18 (×3): qty 2

## 2021-08-18 MED ORDER — SERTRALINE HCL 100 MG PO TABS
100.0000 mg | ORAL_TABLET | Freq: Every day | ORAL | Status: DC
  Administered 2021-08-18 – 2021-08-23 (×6): 100 mg via ORAL
  Filled 2021-08-18 (×6): qty 1

## 2021-08-18 MED ORDER — LEVOTHYROXINE SODIUM 88 MCG PO TABS
88.0000 ug | ORAL_TABLET | Freq: Every day | ORAL | Status: DC
  Administered 2021-08-19 – 2021-08-23 (×5): 88 ug via ORAL
  Filled 2021-08-18 (×6): qty 1

## 2021-08-18 MED ORDER — ONDANSETRON HCL 4 MG PO TABS: 4.0000 mg | ORAL_TABLET | Freq: Four times a day (QID) | ORAL | Status: DC | PRN

## 2021-08-18 MED ORDER — SODIUM CHLORIDE 0.9 % IV BOLUS
500.0000 mL | Freq: Once | INTRAVENOUS | Status: AC
  Administered 2021-08-18: 500 mL via INTRAVENOUS

## 2021-08-18 MED ORDER — ONDANSETRON HCL 4 MG/2ML IJ SOLN: 4.0000 mg | Freq: Four times a day (QID) | INTRAMUSCULAR | Status: DC | PRN

## 2021-08-18 MED ORDER — LEVOTHYROXINE SODIUM 88 MCG PO TABS: 88.0000 ug | ORAL_TABLET | ORAL | Status: DC

## 2021-08-18 MED ORDER — TEMAZEPAM 15 MG PO CAPS: 30.0000 mg | ORAL_CAPSULE | ORAL | Status: DC

## 2021-08-18 NOTE — ED Provider Notes (Signed)
Emergency Department Provider Note   I have reviewed the triage vital signs and the nursing notes.   HISTORY  Chief Complaint No chief complaint on file.   HPI [name] is a 87 y.o. female with PMH reviewed below presents to the ED with weakness and new hypoxemia. Symptoms are worsening over the last 3-5 days.  Patient tells me that she is not having pain anywhere but fatigue and mental status changes potentially limit the history.  Level 5 caveat applies.  No family immediately available for additional history but will attempt to reach out to them.   EMS reports that the patient has not been ambulatory for at least the past 2 days.  Home oxygen was very low with family and found to be 88% with EMS on room air and she was placed on 2 L nasal cannula which is new for her.  Past Medical History:  Diagnosis Date   Allergy    Anemia    Anxiety    Aortic atherosclerosis ([place]) 07/21/2021   Arthritis    Asthma    Benign positional vertigo    Blood transfusion without reported diagnosis    Cardiac pacemaker in situ 05/01/2015   for syncope & Sx Bradycardia -> Medtronic MRI compatible pacemaker placed Dr. Caryl Comes    Cataract    Chronic diarrhea    Chronic kidney disease, stage IV (severe) (HCC)    Colon polyps    Diastolic dysfunction, left ventricle 11/03/2013   Diverticulosis    Esophageal stricture    GERD (gastroesophageal reflux disease)    Gout    Heart murmur    Hemorrhoids    Hyperlipidemia    Hypertensive heart disease    no significant RAS by MRA 2002- (<30% LRAS)    Hypothyroidism    IBS (irritable bowel syndrome)    LBBB (left bundle branch block)- new 11/01/13 11/01/2013   Paroxysmal atrial fibrillation 11/01/2013   Intermittent through the years and recurrent in December of 2014 treated with amiodarone;; Negative Myoview 10/2013   Peripheral vascular disease    123456 LICA, [id number] RICA by doppler 2009    Sinus arrest 04/28/2015   Syncope and collapse  04/28/2015   s/p PPM   Tubulovillous adenoma 4/07    Patient Active Problem List   Diagnosis Date Noted   Generalized weakness 08/18/2021   Hypertensive urgency 07/21/2021   Elevated troponin 07/21/2021   Aortic atherosclerosis ([place]) 07/21/2021   Chest pain 07/20/2021   Depression with anxiety 05/16/2020   Orthostatic dizziness 04/21/2019   Malnutrition of moderate degree 12/13/2017   Pressure injury of skin 12/12/2017   Hypokalemia 12/11/2017   Acute kidney injury superimposed on CKD ([place]) 12/11/2017   COPD (chronic obstructive pulmonary disease) ([place]) 12/11/2017   Hypocalcemia 12/11/2017   Hypoxia 12/05/2017   Chronic back pain 07/16/2017   Iron deficiency anemia 03/24/2017   IDA (iron deficiency anemia) 03/20/2017   Dyspnea on exertion 02/21/2017   Insomnia, persistent 07/26/2015   Cardiac pacemaker in situ    Right bundle branch block (RBBB) intermittent  04/28/2015   Closed left hip fracture ([place])    Syncope, cardiogenic    Abnormal liver function tests 11/10/2014   Cauda equina syndrome with neurogenic bladder ([place]) 10/04/2014   CKD (chronic kidney disease), stage IV ([place]) 07/31/2014   Gout 06/17/2014   Edema of both legs 02/09/2014   Hypertensive heart and chronic kidney disease with chronic diastolic congestive heart failure ([place]) 12/03/2013   H/O tachycardia-bradycardia syndrome  Chronic anticoagulation- Eliquis 11/17/2013   LBBB (left bundle branch block)- new 11/01/13 11/01/2013   Persistent Paroxysmal atrial fibrillation: CHA2DS2-VASc Score 5, on Eliquis 11/01/2013   Peripheral vascular disease    Benign positional vertigo    Irritable bowel syndrome 03/22/2009   Anxiety state    Reactive airway disease 01/13/2008   Chronic anemia    Osteoarthritis    Hypothyroidism    Hyperlipidemia     Past Surgical History:  Procedure Laterality Date   APPENDECTOMY     CATARACT EXTRACTION     EP IMPLANTABLE DEVICE N/A 05/01/2015   Procedure: Pacemaker Implant;   Surgeon: Deboraha Sprang, MD;  Location: [facility];  Service: Cardiovascular;  Laterality: N/A;   HIP ARTHROPLASTY Left 05/01/2015   Procedure: ARTHROPLASTY BIPOLAR HIP (HEMIARTHROPLASTY);  Surgeon: Rod Can, MD;  Location: [facility];  Service: Orthopedics;  Laterality: Left;   LOW ANTERIOR BOWEL RESECTION  7/08   LUMBAR [place] SURGERY  2000   NM MYOVIEW LTD  03/18/2013   Negative for ischemia or infarction. EF 55%.  LOW RISK   OOPHORECTOMY  1962   right   squamous cell skin cancer removed     TRANSTHORACIC ECHOCARDIOGRAM  10/2013, 02/2017   A) EF 55-60%, mild LVH, elevated bili pressures. Mild aortic valve calcification;; B) Normal LV size and function with mild LVH. Unable to assess diastolic function. Normal PA pressures. Mild MR,Mild AI    Allergies Penicillins, Xifaxan [rifaximin], Ambien [zolpidem tartrate], Amoxicillin, Ceftin [cefuroxime axetil], Codeine phosphate, Colchicine, Levofloxacin, and Lipitor [atorvastatin]  Family History  Problem Relation Age of Onset   Cancer Mother 60       unknown   Heart attack Father    Heart disease Brother    Stroke Maternal Grandmother    Lung cancer Maternal Grandfather    Stroke Paternal Grandmother    Colon cancer Daughter    Heart attack Son    Esophageal cancer Neg Hx    Rectal cancer Neg Hx    Stomach cancer Neg Hx     Social History Social History   Tobacco Use   Smoking status: Never   Smokeless tobacco: Never   Tobacco comments:    never used tobacco  Vaping Use   Vaping Use: Never used  Substance Use Topics   Alcohol use: No    Alcohol/week: 0.0 standard drinks   Drug use: No    Review of Systems  Constitutional: No fever/chills. Positive fatigue/generalized weakness.  Eyes: No visual changes. ENT: No sore throat. Cardiovascular: Denies chest pain. Respiratory: Denies shortness of breath. Gastrointestinal: No abdominal pain.  No nausea, no vomiting.  No diarrhea.  No constipation. Genitourinary:  Negative for dysuria. Musculoskeletal: Negative for back pain. Skin: Negative for rash. Neurological: Negative for headaches, focal weakness or numbness.  10-point ROS otherwise negative.  ____________________________________________   PHYSICAL EXAM:  VITAL SIGNS: ED Triage Vitals  Enc Vitals Group     BP 08/18/21 1300 (!) 175/80     Pulse Rate 08/18/21 1300 81     Resp 08/18/21 1300 18     Temp 08/18/21 1300 97.6 F (36.4 C)     Temp Source 08/18/21 1300 Oral     SpO2 08/18/21 1300 90 %   Constitutional: Alert with some confusion noted. No acute distress.  Eyes: Conjunctivae are normal.  Head: Atraumatic. Nose: No congestion/rhinnorhea. Mouth/Throat: Mucous membranes are dry.  Neck: No stridor.  Cardiovascular: Normal rate, regular rhythm. Good peripheral circulation. Grossly normal heart sounds.  Respiratory: Normal respiratory effort.  No retractions. Lungs CTAB. Gastrointestinal: Soft and nontender. No distention.  Musculoskeletal: No gross deformities of extremities. Neurologic:  Normal speech and language. 4/5 strength throughout. No numbness.  Skin:  Skin is warm, dry and intact. No rash noted.   ____________________________________________   LABS (all labs ordered are listed, but only abnormal results are displayed)  Labs Reviewed  COMPREHENSIVE METABOLIC PANEL - Abnormal; Notable for the following components:      Result Value   Chloride 95 (*)    Glucose, Bld 104 (*)    BUN 42 (*)    Creatinine, Ser 2.04 (*)    AST 69 (*)    GFR, Estimated 22 (*)    All other components within normal limits  CBC WITH DIFFERENTIAL/PLATELET - Abnormal; Notable for the following components:   MCV 101.3 (*)    Abs Immature Granulocytes 0.21 (*)    All other components within normal limits  BRAIN NATRIURETIC PEPTIDE - Abnormal; Notable for the following components:   B Natriuretic Peptide 1,115.6 (*)    All other components within normal limits  BRAIN NATRIURETIC  PEPTIDE - Abnormal; Notable for the following components:   B Natriuretic Peptide 1,277.1 (*)    All other components within normal limits  TROPONIN I (HIGH SENSITIVITY) - Abnormal; Notable for the following components:   Troponin I (High Sensitivity) 59 (*)    All other components within normal limits  TROPONIN I (HIGH SENSITIVITY) - Abnormal; Notable for the following components:   Troponin I (High Sensitivity) 78 (*)    All other components within normal limits  RESP PANEL BY RT-PCR (FLU A&B, COVID) ARPGX2  URINE CULTURE  LIPASE, BLOOD  TSH  URINALYSIS, ROUTINE W REFLEX MICROSCOPIC  CBC  BASIC METABOLIC PANEL   ____________________________________________  EKG  None ____________________________________________  RADIOLOGY  CT HEAD WO CONTRAST (5MM)  Result Date: 08/18/2021 CLINICAL DATA:  Mental status change, failure to thrive for 1 week EXAM: CT HEAD WITHOUT CONTRAST TECHNIQUE: Contiguous axial images were obtained from the base of the skull through the vertex without intravenous contrast. COMPARISON:  05/04/2020 FINDINGS: Brain: No evidence of acute infarction, hemorrhage, extra-axial collection, ventriculomegaly, or mass effect. Generalized cerebral atrophy. Periventricular white matter low attenuation likely secondary to microangiopathy. Vascular: Cerebrovascular atherosclerotic calcifications are noted. Skull: Negative for fracture or focal lesion. Sinuses/Orbits: Visualized portions of the orbits are unremarkable. Visualized portions of the paranasal sinuses are unremarkable. Visualized portions of the mastoid air cells are unremarkable. Other: None. IMPRESSION: 1. No acute intracranial pathology. 2. Chronic microvascular disease and cerebral atrophy. Electronically Signed   By: Kathreen Devoid M.D.   On: 08/18/2021 14:08   DG Chest Portable 1 View  Result Date: 08/18/2021 CLINICAL DATA:  Shortness of breath, hypoxemia EXAM: PORTABLE CHEST 1 VIEW COMPARISON:  07/20/2021  FINDINGS: Small left pleural effusion. No right pleural effusion. No pneumothorax. Mild bilateral interstitial thickening. Stable cardiomegaly. Dual lead cardiac pacemaker. No acute osseous abnormality. IMPRESSION: Cardiomegaly with mild pulmonary vascular congestion. Electronically Signed   By: Kathreen Devoid M.D.   On: 08/18/2021 14:06    ____________________________________________   PROCEDURES  Procedure(s) performed:   Procedures  CRITICAL CARE Performed by: Margette Fast Total critical care time: 35 minutes Critical care time was exclusive of separately billable procedures and treating other patients. Critical care was necessary to treat or prevent imminent or life-threatening deterioration. Critical care was time spent personally by me on the following activities: development of treatment plan with patient and/or  surrogate as well as nursing, discussions with consultants, evaluation of patient's response to treatment, examination of patient, obtaining history from patient or surrogate, ordering and performing treatments and interventions, ordering and review of laboratory studies, ordering and review of radiographic studies, pulse oximetry and re-evaluation of patient's condition.  Nanda Quinton, MD Emergency Medicine  ____________________________________________   INITIAL IMPRESSION / ASSESSMENT AND PLAN / ED COURSE  Pertinent labs & imaging results that were available during my care of the patient were reviewed by me and considered in my medical decision making (see chart for details).   Patient presents to the emergency department with generalized weakness.  She is found to have some mild hypoxemia with EMS, confirmed here in the emergency department and remains on 2 L nasal cannula.  She is not in respiratory distress.  She does appear clinically dehydrated.  She is afebrile and does not have SIRS vitals.  Differential at this time is broad and includes electrolyte derangement,  AKI, metabolic encephalopathy, COVID, community-acquired pneumonia, atypical ACS.   Labs reviewed. COVID and Flu negative. Troponin mildly elevated. CKD noted but creatinine elevated from baseline. Plan for IVF and admit. Mild/early vasular congestion on CXR but clinically patient appears dry.   Discussed patient's case with [facility] to request admission. Patient and family (if present) updated with plan. Care transferred to [facility] service.  I reviewed all nursing notes, vitals, pertinent old records, EKGs, labs, imaging (as available).  ____________________________________________  FINAL CLINICAL IMPRESSION(S) / ED DIAGNOSES  Final diagnoses:  AKI (acute kidney injury) ([place])  Hypoxemia     MEDICATIONS GIVEN DURING THIS VISIT:  Medications  acetaminophen (TYLENOL) tablet 650 mg (650 mg Oral Given 08/18/21 2155)    Or  acetaminophen (TYLENOL) suppository 650 mg ( Rectal See Alternative 08/18/21 2155)  ondansetron (ZOFRAN) tablet 4 mg (has no administration in time range)    Or  ondansetron (ZOFRAN) injection 4 mg (has no administration in time range)  carvedilol (COREG) tablet 6.25 mg (6.25 mg Oral Given 08/18/21 2155)  sertraline (ZOLOFT) tablet 100 mg (100 mg Oral Given 08/18/21 2155)  apixaban (ELIQUIS) tablet 2.5 mg (2.5 mg Oral Given 08/18/21 2155)  amLODipine (NORVASC) tablet 2.5 mg (2.5 mg Oral Given 08/18/21 2155)  nitroGLYCERIN (NITROSTAT) SL tablet 0.4 mg (has no administration in time range)  levothyroxine (SYNTHROID) tablet 88 mcg (has no administration in time range)  dextrose 5 % solution ( Intravenous New Bag/Given 08/18/21 1908)  sodium chloride 0.9 % bolus 500 mL (500 mLs Intravenous New Bag/Given 08/18/21 1313)     Note:  This document was prepared using Dragon voice recognition software and may include unintentional dictation errors.  Nanda Quinton, MD, [facility] Emergency Medicine    Kihanna Kamiya, [facility], MD 08/18/21 2231

## 2021-08-18 NOTE — H&P (Signed)
Patient History and Physical    [name] [id number] DOB: 04-May-1924 DOA: 08/18/2021  PCP: [name], MD  Patient coming from: Home.  I have personally briefly reviewed patient's old medical records in [place]  Chief Complaint: Generalized weakness.  HPI: [name] is a 87 y.o. female with medical history significant of allergy, microcytic anemia, anxiety, osteoarthritis, asthma, benign positional vertigo, cataracts, chronic diarrhea, IBS, stage IIIa CKD, diverticulosis, GERD, esophageal stricture, gout, hemorrhoids, hyperlipidemia, hypothyroidism,, diastolic dysfunction, sinus arrest, syncope and collapse, tachybradycardia syndrome with pacemaker placement, LBBB, persistent atrial fibrillation, hypertensive heart disease, peripheral vascular disease who is brought to the emergency department due to generalized weakness associated with severely decreased oral intake and episodes of confusion.  Her son stated that there has not been fever, and rhinorrhea, productive cough, nausea, emesis, diarrhea or urinary symptoms.  No dyspnea, palpitations, diaphoresis, PND, orthopnea or recent pitting edema of the lower extremities.  No polyuria, polydipsia, polyphagia or blurred vision.  ED Course: Initial vital signs were temperature 97.6 F, pulse 81, respiration 18, BP 175/80 mmHg O2 sat 90% on room air.  The patient received 500 mL NS bolus in the emergency department.  Lab work: Her CBC showed a white count of 4.4, hemoglobin 14.3 g/dL platelets 160.  Troponin was 59 and then 78 ng/L.  BNP was 1277.1 pg/mL.  CMP showed chloride 95 mmol/L, but all electrolytes were normal.  Glucose 104, BUN 42 and creatinine 2.04 mg/dL.  AST was 69 units/L, the rest of the LFTs were normal.  Imaging: CT head without contrast did not show any acute intracranial pathology.  Chest radiograph showed cardiomegaly with mild pulmonary congestion.  Please see images and phonated report for further  details.  Review of Systems: As per HPI otherwise all other systems reviewed and are negative.  Past Medical History:  Diagnosis Date   Allergy    Anemia    Anxiety    Aortic atherosclerosis ([place]) 07/21/2021   Arthritis    Asthma    Benign positional vertigo    Blood transfusion without reported diagnosis    Cardiac pacemaker in situ 05/01/2015   for syncope & Sx Bradycardia -> Medtronic MRI compatible pacemaker placed Dr. Caryl Comes    Cataract    Chronic diarrhea    Chronic kidney disease, stage IV (severe) (HCC)    Colon polyps    Diastolic dysfunction, left ventricle 11/03/2013   Diverticulosis    Esophageal stricture    GERD (gastroesophageal reflux disease)    Gout    Heart murmur    Hemorrhoids    Hyperlipidemia    Hypertensive heart disease    no significant RAS by MRA 2002- (<30% LRAS)    Hypothyroidism    IBS (irritable bowel syndrome)    LBBB (left bundle branch block)- new 11/01/13 11/01/2013   Paroxysmal atrial fibrillation 11/01/2013   Intermittent through the years and recurrent in December of 2014 treated with amiodarone;; Negative Myoview 10/2013   Peripheral vascular disease    123456 LICA, [id number] RICA by doppler 2009    Sinus arrest 04/28/2015   Syncope and collapse 04/28/2015   s/p PPM   Tubulovillous adenoma 4/07    Past Surgical History:  Procedure Laterality Date   APPENDECTOMY     CATARACT EXTRACTION     EP IMPLANTABLE DEVICE N/A 05/01/2015   Procedure: Pacemaker Implant;  Surgeon: Deboraha Sprang, MD;  Location: [facility];  Service: Cardiovascular;  Laterality: N/A;   HIP  ARTHROPLASTY Left 05/01/2015   Procedure: ARTHROPLASTY BIPOLAR HIP (HEMIARTHROPLASTY);  Surgeon: Rod Can, MD;  Location: [facility];  Service: Orthopedics;  Laterality: Left;   LOW ANTERIOR BOWEL RESECTION  7/08   LUMBAR [place] SURGERY  2000   NM MYOVIEW LTD  03/18/2013   Negative for ischemia or infarction. EF 55%.  LOW RISK   OOPHORECTOMY  1962   right   squamous cell  skin cancer removed     TRANSTHORACIC ECHOCARDIOGRAM  10/2013, 02/2017   A) EF 55-60%, mild LVH, elevated bili pressures. Mild aortic valve calcification;; B) Normal LV size and function with mild LVH. Unable to assess diastolic function. Normal PA pressures. Mild MR,Mild AI   Social History  reports that she has never smoked. She has never used smokeless tobacco. She reports that she does not drink alcohol and does not use drugs.  Allergies  Allergen Reactions   Penicillins Swelling    REACTION: nausea, swelling   Xifaxan [Rifaximin] Other (See Comments) and Rash    unknown   Ambien [Zolpidem Tartrate]     " made her crazy "   Amoxicillin     REACTION: unspecified   Ceftin [Cefuroxime Axetil] Diarrhea   Codeine Phosphate     REACTION: unspecified   Colchicine     Severe diarrhea   Levofloxacin Other (See Comments)    Tingle numbness   Lipitor [Atorvastatin] Other (See Comments)    "makes legs jump all night"   Family History  Problem Relation Age of Onset   Cancer Mother 28       unknown   Heart attack Father    Heart disease Brother    Stroke Maternal Grandmother    Lung cancer Maternal Grandfather    Stroke Paternal Grandmother    Colon cancer Daughter    Heart attack Son    Esophageal cancer Neg Hx    Rectal cancer Neg Hx    Stomach cancer Neg Hx    Prior to Admission medications   Medication Sig Start Date End Date Taking? Authorizing Provider  acetaminophen (TYLENOL) 325 MG tablet Take 2 tablets (650 mg total) by mouth every 6 (six) hours as needed for mild pain (or Fever >/= 101). 12/17/17  Yes Rizwan, Eunice Blase, MD  amLODipine (NORVASC) 2.5 MG tablet TAKE 1 TABLET BY MOUTH EVERY DAY Patient taking differently: Take 2.5 mg by mouth daily. 05/16/21  Yes [name], MD  apixaban (ELIQUIS) 2.5 MG TABS tablet Take 1 tablet (2.5 mg total) by mouth 2 (two) times daily. 08/07/20  Yes [name], MD  carvedilol (COREG) 6.25 MG tablet Take 1 tablet (6.25 mg total) by  mouth 2 (two) times daily. 09/22/20  Yes [name], MD  hydrALAZINE (APRESOLINE) 100 MG tablet Take 1.5 tablets (150 mg total) by mouth 2 (two) times daily. 08/07/21  Yes [name], MD  levothyroxine (SYNTHROID) 88 MCG tablet TAKE 1 TABLET DAILY BEFORE BREAKFAST Patient taking differently: Take 88 mcg by mouth See admin instructions. TAKE 1 TABLET DAILY BEFORE BREAKFAST 10/17/20  Yes [name], MD  LORazepam (ATIVAN) 0.5 MG tablet Take 1 tablet (0.5 mg total) by mouth every 6 (six) hours as needed for anxiety. 07/24/21  Yes [name], MD  nitroGLYCERIN (NITROSTAT) 0.4 MG SL tablet Place 1 tablet (0.4 mg total) under the tongue every 5 (five) minutes x 3 doses as needed for chest pain. 11/22/20  Yes [name], MD  sertraline (ZOLOFT) 100 MG tablet Take 1  tablet (100 mg total) by mouth daily. 04/11/21  Yes [name], MD  temazepam (RESTORIL) 15 MG capsule TAKE 2 CAPSULE BY MOUTH EVERY NIGHT AT BEDTIME AS NEEDED FOR SLEEP Patient taking differently: Take 30 mg by mouth See admin instructions. TAKE 2 CAPSULE BY MOUTH EVERY NIGHT AT BEDTIME AS NEEDED FOR SLEEP 06/15/21  Yes [name], MD   Physical Exam: Vitals:   08/18/21 1300 08/18/21 1400 08/18/21 1708  BP: (!) 175/80 (!) 152/73 (!) 186/96  Pulse: 81 91 76  Resp: 18 17   Temp: 97.6 F (36.4 C)  97.6 F (36.4 C)  TempSrc: Oral  Oral  SpO2: 90% 99% 95%   Constitutional: Frail, elderly female.  Chronically ill-appearing.   NAD, calm, comfortable Eyes: PERRL, lids and conjunctivae normal ENMT: Mucous membranes and lips are dry. Posterior pharynx clear of any exudate or lesions. Neck: normal, supple, no masses, no thyromegaly Respiratory: Shallow inspiration, otherwise clear to auscultation bilaterally, no wheezing, no crackles. Normal respiratory effort. No accessory muscle use.  Cardiovascular: Regular rate and rhythm, no murmurs / rubs / gallops. No extremity edema. 2+ pedal pulses. No carotid bruits.  Abdomen:  No distention.  Bowel sounds positive.  Soft, no tenderness, no masses palpated. No hepatosplenomegaly.  Musculoskeletal: Moderate to severe generalized weakness.  No clubbing / cyanosis. Good ROM, no contractures. Normal muscle tone.  Skin: Multiple areas of ecchymosis particularly on extremities.  Decreased skin turgor. Neurologic: CN 2-12 grossly intact. Sensation intact, DTR normal. Strength 5/5 in all 4.  Psychiatric: Normal judgment and insight. Alert and oriented x 3. Normal mood.   Labs on Admission: I have personally reviewed following labs and imaging studies  CBC: Recent Labs  Lab 08/18/21 1318  WBC 4.4  NEUTROABS 3.2  HGB 14.3  HCT 46.0  MCV 101.3*  PLT 0000000   Basic Metabolic Panel: Recent Labs  Lab 08/18/21 1318  NA 137  K 4.1  CL 95*  CO2 30  GLUCOSE 104*  BUN 42*  CREATININE 2.04*  CALCIUM 8.9   GFR: Estimated Creatinine Clearance: 13.3 mL/min (A) (by C-G formula based on SCr of 2.04 mg/dL (H)).  Liver Function Tests: Recent Labs  Lab 08/18/21 1318  AST 69*  ALT 43  ALKPHOS 54  BILITOT 1.0  PROT 7.6  ALBUMIN 4.5   Radiological Exams on Admission: CT HEAD WO CONTRAST (5MM)  Result Date: 08/18/2021 CLINICAL DATA:  Mental status change, failure to thrive for 1 week EXAM: CT HEAD WITHOUT CONTRAST TECHNIQUE: Contiguous axial images were obtained from the base of the skull through the vertex without intravenous contrast. COMPARISON:  05/04/2020 FINDINGS: Brain: No evidence of acute infarction, hemorrhage, extra-axial collection, ventriculomegaly, or mass effect. Generalized cerebral atrophy. Periventricular white matter low attenuation likely secondary to microangiopathy. Vascular: Cerebrovascular atherosclerotic calcifications are noted. Skull: Negative for fracture or focal lesion. Sinuses/Orbits: Visualized portions of the orbits are unremarkable. Visualized portions of the paranasal sinuses are unremarkable. Visualized portions of the mastoid air cells are  unremarkable. Other: None. IMPRESSION: 1. No acute intracranial pathology. 2. Chronic microvascular disease and cerebral atrophy. Electronically Signed   By: Kathreen Devoid M.D.   On: 08/18/2021 14:08   DG Chest Portable 1 View  Result Date: 08/18/2021 CLINICAL DATA:  Shortness of breath, hypoxemia EXAM: PORTABLE CHEST 1 VIEW COMPARISON:  07/20/2021 FINDINGS: Small left pleural effusion. No right pleural effusion. No pneumothorax. Mild bilateral interstitial thickening. Stable cardiomegaly. Dual lead cardiac pacemaker. No acute osseous abnormality. IMPRESSION: Cardiomegaly with mild pulmonary  vascular congestion. Electronically Signed   By: Kathreen Devoid M.D.   On: 08/18/2021 14:06    07/21/2021 echocardiogram IMPRESSIONS:   1. Left ventricular ejection fraction, by estimation, is 60 to 65%. The  left ventricle has normal function. The left ventricle has no regional  wall motion abnormalities. Left ventricular diastolic parameters are  indeterminate.   2. Right ventricular systolic function is normal. The right ventricular  size is normal. There is mildly elevated pulmonary artery systolic  pressure.   3. Left atrial size was severely dilated.   4. Right atrial size was mildly dilated.   5. A small pericardial effusion is present. The pericardial effusion is  localized near the right atrium and anterior to the right ventricle. There  is no evidence of cardiac tamponade.   6. The mitral valve is normal in structure. Mild mitral valve  regurgitation. No evidence of mitral stenosis.   7. Tricuspid valve regurgitation is mild to moderate.   8. The aortic valve is tricuspid. There is mild thickening of the aortic  valve. Aortic valve regurgitation is not visualized. Mild aortic valve  sclerosis is present, with no evidence of aortic valve stenosis. Aortic  valve area, by VTI measures 1.60 cm.   Aortic valve mean gradient measures 4.0 mmHg. Aortic valve Vmax measures  1.31 m/s.   9. The right  lower pulmonary vein is abnormal. The inferior vena cava is  normal in size with greater than 50% respiratory variability, suggesting  right atrial pressure of 3 mmHg.   EKG: Independently reviewed.   Assessment/Plan Principal Problem:   AKI superimposed on CKD IV Clinically dehydrated Observation/telemetry. Monitor intake and output. Avoid nephrotoxic agents. Avoid hypotension. Hydrate slowly with D5W instead of crystalloid. Follow-up renal function electrolytes in the morning.  Active Problems:   Elevated troponin Likely demand ischemia Recent echocardiogram with preserved function.    Hypothyroidism Continue levohyroxine 88 mcg daily.     Hyperlipidemia/Aortic atherosclerosis Not on medical therapy. Follow-up with PCP.     Persistent Paroxysmal atrial fibrillation CHA2DS2-VASc Score 5 Continue Eliquis. Continue beta-blocker for rate control.     COPD (chronic obstructive pulmonary disease)  Supplemental oxygen as needed. Bronchodilators as needed.     Depression with anxiety Continue sertraline 100 mg p.o. bedtime. Hold benzodiazepines while in the hospital.   DVT prophylaxis: On apixaban. Code Status:   DNR. Family Communication:   Disposition Plan:   Patient is from:  Home.  Anticipated DC to:  Home.  Anticipated DC date:  08/20/2021.  Anticipated DC barriers: Clinical status.  Consults called:   Admission status:  Observation/telemetry.  Severity of Illness:  High severity after presenting with generalized weakness in the setting of acute kidney injury.  Reubin Milan MD [facility]  How to contact the [facility] Attending or Consulting provider [place] or covering provider during after hours [place], for this patient?   Check the care team in [facility] and look for a) attending/consulting [facility] provider listed and b) the [facility] team listed Log into [email or web address] and use [place]'s universal password to access. If you do not have the password, please  contact the hospital operator. Locate the [facility] provider you are looking for under [facility] and page to a number that you can be directly reached. If you still have difficulty reaching the provider, please page the [facility] (Director on Call) for the Hospitalists listed on amion for assistance.  08/18/2021, 5:29 PM   This document was prepared using Dragon voice  recognition software and may contain some unintended transcription errors.

## 2021-08-18 NOTE — ED Triage Notes (Signed)
BIB E[facility] form home, family states FTT x1 week, has not really gotten up and ambulated x2 days, normally walking. Family checked O2 at home, was in the 34s called 911. W/ EMS RA O2 was 88%, placed on 2L [place]. Upon presentation pt at 99% RA. No complaints.

## 2021-08-19 ENCOUNTER — Inpatient Hospital Stay (HOSPITAL_COMMUNITY): Payer: Medicare PPO

## 2021-08-19 ENCOUNTER — Encounter (HOSPITAL_COMMUNITY): Payer: Self-pay | Admitting: Internal Medicine

## 2021-08-19 DIAGNOSIS — N189 Chronic kidney disease, unspecified: Secondary | ICD-10-CM | POA: Diagnosis not present

## 2021-08-19 DIAGNOSIS — J9811 Atelectasis: Secondary | ICD-10-CM | POA: Diagnosis present

## 2021-08-19 DIAGNOSIS — G9341 Metabolic encephalopathy: Secondary | ICD-10-CM | POA: Diagnosis present

## 2021-08-19 DIAGNOSIS — Z6820 Body mass index (BMI) 20.0-20.9, adult: Secondary | ICD-10-CM | POA: Diagnosis not present

## 2021-08-19 DIAGNOSIS — I4819 Other persistent atrial fibrillation: Secondary | ICD-10-CM | POA: Diagnosis present

## 2021-08-19 DIAGNOSIS — N179 Acute kidney failure, unspecified: Secondary | ICD-10-CM | POA: Diagnosis present

## 2021-08-19 DIAGNOSIS — R778 Other specified abnormalities of plasma proteins: Secondary | ICD-10-CM | POA: Diagnosis not present

## 2021-08-19 DIAGNOSIS — F418 Other specified anxiety disorders: Secondary | ICD-10-CM | POA: Diagnosis not present

## 2021-08-19 DIAGNOSIS — Z96642 Presence of left artificial hip joint: Secondary | ICD-10-CM | POA: Diagnosis present

## 2021-08-19 DIAGNOSIS — K219 Gastro-esophageal reflux disease without esophagitis: Secondary | ICD-10-CM | POA: Diagnosis present

## 2021-08-19 DIAGNOSIS — E860 Dehydration: Secondary | ICD-10-CM | POA: Diagnosis present

## 2021-08-19 DIAGNOSIS — I248 Other forms of acute ischemic heart disease: Secondary | ICD-10-CM | POA: Diagnosis present

## 2021-08-19 DIAGNOSIS — I959 Hypotension, unspecified: Secondary | ICD-10-CM | POA: Diagnosis present

## 2021-08-19 DIAGNOSIS — Z20822 Contact with and (suspected) exposure to covid-19: Secondary | ICD-10-CM | POA: Diagnosis present

## 2021-08-19 DIAGNOSIS — I129 Hypertensive chronic kidney disease with stage 1 through stage 4 chronic kidney disease, or unspecified chronic kidney disease: Secondary | ICD-10-CM | POA: Diagnosis present

## 2021-08-19 DIAGNOSIS — N184 Chronic kidney disease, stage 4 (severe): Secondary | ICD-10-CM | POA: Diagnosis present

## 2021-08-19 DIAGNOSIS — J9601 Acute respiratory failure with hypoxia: Secondary | ICD-10-CM | POA: Diagnosis present

## 2021-08-19 DIAGNOSIS — R627 Adult failure to thrive: Secondary | ICD-10-CM | POA: Diagnosis present

## 2021-08-19 DIAGNOSIS — F419 Anxiety disorder, unspecified: Secondary | ICD-10-CM | POA: Diagnosis present

## 2021-08-19 DIAGNOSIS — E43 Unspecified severe protein-calorie malnutrition: Secondary | ICD-10-CM | POA: Diagnosis present

## 2021-08-19 DIAGNOSIS — J449 Chronic obstructive pulmonary disease, unspecified: Secondary | ICD-10-CM | POA: Diagnosis present

## 2021-08-19 DIAGNOSIS — I739 Peripheral vascular disease, unspecified: Secondary | ICD-10-CM | POA: Diagnosis present

## 2021-08-19 DIAGNOSIS — E785 Hyperlipidemia, unspecified: Secondary | ICD-10-CM | POA: Diagnosis present

## 2021-08-19 DIAGNOSIS — Z66 Do not resuscitate: Secondary | ICD-10-CM | POA: Diagnosis present

## 2021-08-19 DIAGNOSIS — R0902 Hypoxemia: Secondary | ICD-10-CM | POA: Diagnosis present

## 2021-08-19 DIAGNOSIS — F32A Depression, unspecified: Secondary | ICD-10-CM | POA: Diagnosis present

## 2021-08-19 DIAGNOSIS — E039 Hypothyroidism, unspecified: Secondary | ICD-10-CM | POA: Diagnosis present

## 2021-08-19 DIAGNOSIS — I700 Atherosclerosis of aorta: Secondary | ICD-10-CM | POA: Diagnosis present

## 2021-08-19 LAB — URINALYSIS, ROUTINE W REFLEX MICROSCOPIC
Bilirubin Urine: NEGATIVE
Glucose, UA: NEGATIVE mg/dL
Hgb urine dipstick: NEGATIVE
Ketones, ur: 5 mg/dL — AB
Nitrite: NEGATIVE
Protein, ur: 100 mg/dL — AB
Specific Gravity, Urine: 1.014 (ref 1.005–1.030)
pH: 5 (ref 5.0–8.0)

## 2021-08-19 LAB — CBC
HCT: 38.1 % (ref 36.0–46.0)
Hemoglobin: 12 g/dL (ref 12.0–15.0)
MCH: 31 pg (ref 26.0–34.0)
MCHC: 31.5 g/dL (ref 30.0–36.0)
MCV: 98.4 fL (ref 80.0–100.0)
Platelets: 124 10*3/uL — ABNORMAL LOW (ref 150–400)
RBC: 3.87 MIL/uL (ref 3.87–5.11)
RDW: 15.1 % (ref 11.5–15.5)
WBC: 3 10*3/uL — ABNORMAL LOW (ref 4.0–10.5)
nRBC: 0 % (ref 0.0–0.2)

## 2021-08-19 LAB — BASIC METABOLIC PANEL
Anion gap: 11 (ref 5–15)
BUN: 41 mg/dL — ABNORMAL HIGH (ref 8–23)
CO2: 27 mmol/L (ref 22–32)
Calcium: 8.1 mg/dL — ABNORMAL LOW (ref 8.9–10.3)
Chloride: 97 mmol/L — ABNORMAL LOW (ref 98–111)
Creatinine, Ser: 2.06 mg/dL — ABNORMAL HIGH (ref 0.44–1.00)
GFR, Estimated: 22 mL/min — ABNORMAL LOW (ref >60)
Glucose, Bld: 101 mg/dL — ABNORMAL HIGH (ref 70–99)
Potassium: 3.7 mmol/L (ref 3.5–5.1)
Sodium: 135 mmol/L (ref 135–145)

## 2021-08-19 MED ORDER — CHLORHEXIDINE GLUCONATE 0.12 % MT SOLN
15.0000 mL | Freq: Two times a day (BID) | OROMUCOSAL | Status: DC
  Administered 2021-08-19 – 2021-08-23 (×6): 15 mL via OROMUCOSAL
  Filled 2021-08-19 (×9): qty 15

## 2021-08-19 MED ORDER — ORAL CARE MOUTH RINSE
15.0000 mL | Freq: Two times a day (BID) | OROMUCOSAL | Status: DC
  Administered 2021-08-19 – 2021-08-22 (×5): 15 mL via OROMUCOSAL

## 2021-08-19 MED ORDER — LACTATED RINGERS IV SOLN: INTRAVENOUS | Status: DC

## 2021-08-19 NOTE — Progress Notes (Signed)
Patient oriented to self only,  sats 75% on 1L nasal cannula, bilateral lung fields with crackles.  MD notified, orders received.  Increased oxygen to 4L and sats came to 94%

## 2021-08-19 NOTE — Progress Notes (Signed)
Notified MD of u/a results

## 2021-08-19 NOTE — Plan of Care (Signed)
  Problem: Clinical Measurements: Goal: Will remain free from infection Outcome: Progressing Goal: Diagnostic test results will improve Outcome: Progressing Goal: Cardiovascular complication will be avoided Outcome: Progressing   Problem: Elimination: Goal: Will not experience complications related to bowel motility Outcome: Progressing Goal: Will not experience complications related to urinary retention Outcome: Progressing   Problem: Pain Managment: Goal: General experience of comfort will improve Outcome: Progressing   Problem: Safety: Goal: Ability to remain free from injury will improve Outcome: Progressing   Problem: Skin Integrity: Goal: Risk for impaired skin integrity will decrease Outcome: Progressing   Problem: Education: Goal: Knowledge of General Education information will improve Description: Including pain rating scale, medication(s)/side effects and non-pharmacologic comfort measures Outcome: Not Progressing   Problem: Health Behavior/Discharge Planning: Goal: Ability to manage health-related needs will improve Outcome: Not Progressing   Problem: Clinical Measurements: Goal: Ability to maintain clinical measurements within normal limits will improve Outcome: Not Progressing Goal: Respiratory complications will improve Outcome: Not Progressing   Problem: Activity: Goal: Risk for activity intolerance will decrease Outcome: Not Progressing   Problem: Nutrition: Goal: Adequate nutrition will be maintained Outcome: Not Progressing   Problem: Coping: Goal: Level of anxiety will decrease Outcome: Not Applicable

## 2021-08-19 NOTE — Progress Notes (Signed)
PROGRESS NOTE    SUMMERLIN DIMERY  [id number] DOB: January 28, 1924 DOA: 08/18/2021 PCP: [name], MD   Brief Narrative:  NYJA RINDT is a 87 y.o. female with medical history significant of allergy, microcytic anemia, anxiety, osteoarthritis, asthma, benign positional vertigo, cataracts, chronic diarrhea, IBS, stage IIIa CKD, diverticulosis, GERD, esophageal stricture, gout, hemorrhoids, hyperlipidemia, hypothyroidism,, diastolic dysfunction, sinus arrest, syncope and collapse, tachybradycardia syndrome with pacemaker placement, LBBB, persistent atrial fibrillation, hypertensive heart disease, peripheral vascular disease who is brought to the emergency department due to generalized weakness associated with severely decreased oral intake and episodes of confusion - admitted for failure to thrive.    Assessment & Plan:   AKI superimposed on CKD IV -Continue IV fluids until p.o. intake improves appropriately  -Unclear etiology for initial decrease in p.o. intake, no suspected infection at this point per discussion with patient and family  -Continue to follow daily labs, creatinine continues to worsen despite IV fluids overnight   Elevated troponin Type II in the setting of demand ischemia hypotension and dehydration Without chest pain, no further work-up indicated at this time   Hypothyroidism Continue levohyroxine 88 mcg daily.   Hyperlipidemia/Aortic atherosclerosis Not on current medical therapy likely in the setting of age and risk benefit of polypharmacy Continue regular diet as tolerated   Persistent Paroxysmal atrial fibrillation CHA2DS2-VASc Score 5 Continue Eliquis. Continue carvedilol as needed.   COPD (chronic obstructive pulmonary disease)  Supplemental oxygen as needed. Bronchodilators as needed.   Depression with anxiety Continue sertraline 100 mg p.o. bedtime. Hold benzodiazepines while in the hospital.     DVT prophylaxis:      On apixaban. Code Status:               DNR. Family Communication: None available  Status is: Inpatient  Dispo: The patient is from: Home              Anticipated d/c is to: To be determined              Anticipated d/c date is: 24 to 48 hours              Patient currently not medically stable for discharge  Consultants:  None  Procedures:  None  Antimicrobials:  None  Subjective: No acute issues or events overnight, p.o. intake still quite poor this morning with poor appetite, denies nausea vomiting diarrhea constipation headache fevers chills chest pain dysuria cough or sputum production  Objective: Vitals:   08/18/21 2051 08/19/21 0230 08/19/21 0527 08/19/21 0632  BP: 128/65 136/62 (!) 165/74   Pulse: 79 61 68   Resp: '16 20 20   '$ Temp: (!) 97.5 F (36.4 C) 98.6 F (37 C) 97.8 F (36.6 C)   TempSrc: Oral     SpO2: 96% 100% 95%   Weight:    49.9 kg  Height:    '5\' 2"'$  (1.575 m)    Intake/Output Summary (Last 24 hours) at 08/19/2021 0751 Last data filed at 08/19/2021 0600 Gross per 24 hour  Intake 1111.36 ml  Output 80 ml  Net 1031.36 ml   Filed Weights   08/19/21 [id number]  Weight: 49.9 kg    Examination:  General: Cachectic appearing elderly female resting comfortably in bed no acute distress. HEENT:  Normocephalic atraumatic.  Sclerae nonicteric, noninjected.  Extraocular movements intact bilaterally. Neck:  Without mass or deformity.  Trachea is midline. Lungs:  Clear to auscultate bilaterally without rhonchi, wheeze, or rales. Heart: Irregularly irregular.  Without murmurs,  rubs, or gallops. Abdomen:  Soft, nontender, nondistended.  Without guarding or rebound. Extremities: Without cyanosis, clubbing, edema, or obvious deformity. Vascular:  Dorsalis pedis and posterior tibial pulses palpable bilaterally. Skin:  Warm and dry, no erythema, no ulcerations.   Data Reviewed: I have personally reviewed following labs and imaging studies  CBC: Recent Labs  Lab 08/18/21 1318  08/19/21 0615  WBC 4.4 3.0*  NEUTROABS 3.2  --   HGB 14.3 12.0  HCT 46.0 38.1  MCV 101.3* 98.4  PLT 160 [id number]*   Basic Metabolic Panel: Recent Labs  Lab 08/18/21 1318 08/19/21 0347  NA 137 135  K 4.1 3.7  CL 95* 97*  CO2 30 27  GLUCOSE 104* 101*  BUN 42* 41*  CREATININE 2.04* 2.06*  CALCIUM 8.9 8.1*   GFR: Estimated Creatinine Clearance: 13.2 mL/min (A) (by C-G formula based on SCr of 2.06 mg/dL (H)). Liver Function Tests: Recent Labs  Lab 08/18/21 1318  AST 69*  ALT 43  ALKPHOS 54  BILITOT 1.0  PROT 7.6  ALBUMIN 4.5   Recent Labs  Lab 08/18/21 1318  LIPASE 42   No results for input(s): AMMONIA in the last 168 hours. Coagulation Profile: No results for input(s): INR, PROTIME in the last 168 hours. Cardiac Enzymes: No results for input(s): CKTOTAL, CKMB, CKMBINDEX, TROPONINI in the last 168 hours. BNP (last 3 results) No results for input(s): PROBNP in the last 8760 hours. HbA1C: No results for input(s): HGBA1C in the last 72 hours. CBG: No results for input(s): GLUCAP in the last 168 hours. Lipid Profile: No results for input(s): CHOL, HDL, LDLCALC, TRIG, CHOLHDL, LDLDIRECT in the last 72 hours. Thyroid Function Tests: Recent Labs    08/18/21 1310  TSH 1.828   Anemia Panel: No results for input(s): VITAMINB12, FOLATE, FERRITIN, TIBC, IRON, RETICCTPCT in the last 72 hours. Sepsis Labs: No results for input(s): PROCALCITON, LATICACIDVEN in the last 168 hours.  Recent Results (from the past 240 hour(s))  Resp Panel by RT-PCR (Flu A&B, Covid) Nasopharyngeal Swab     Status: None   Collection Time: 08/18/21  1:18 PM   Specimen: Nasopharyngeal Swab; Nasopharyngeal(NP) swabs in vial transport medium  Result Value Ref Range Status   SARS Coronavirus 2 by RT PCR NEGATIVE NEGATIVE Final    Comment: (NOTE) SARS-CoV-2 target nucleic acids are NOT DETECTED.  The SARS-CoV-2 RNA is generally detectable in upper respiratory specimens during the acute phase of  infection. The lowest concentration of SARS-CoV-2 viral copies this assay can detect is 138 copies/mL. A negative result does not preclude SARS-Cov-2 infection and should not be used as the sole basis for treatment or other patient management decisions. A negative result may occur with  improper specimen collection/handling, submission of specimen other than nasopharyngeal swab, presence of viral mutation(s) within the areas targeted by this assay, and inadequate number of viral copies(<138 copies/mL). A negative result must be combined with clinical observations, patient history, and epidemiological information. The expected result is Negative.  Fact Sheet for Patients:  EntrepreneurPulse.com.au  Fact Sheet for Healthcare Providers:  IncredibleEmployment.be  This test is no t yet approved or cleared by the [place] FDA and  has been authorized for detection and/or diagnosis of SARS-CoV-2 by FDA under an Emergency Use Authorization (EUA). This EUA will remain  in effect (meaning this test can be used) for the duration of the COVID-19 declaration under Section 564(b)(1) of the Act, 21 U.S.C.section 360bbb-3(b)(1), unless the authorization is terminated  or revoked sooner.  Influenza A by PCR NEGATIVE NEGATIVE Final   Influenza B by PCR NEGATIVE NEGATIVE Final    Comment: (NOTE) The Xpert Xpress SARS-CoV-2/FLU/RSV plus assay is intended as an aid in the diagnosis of influenza from Nasopharyngeal swab specimens and should not be used as a sole basis for treatment. Nasal washings and aspirates are unacceptable for Xpert Xpress SARS-CoV-2/FLU/RSV testing.  Fact Sheet for Patients: EntrepreneurPulse.com.au  Fact Sheet for Healthcare Providers: IncredibleEmployment.be  This test is not yet approved or cleared by the [place] FDA and has been authorized for detection and/or diagnosis of SARS-CoV-2  by FDA under an Emergency Use Authorization (EUA). This EUA will remain in effect (meaning this test can be used) for the duration of the COVID-19 declaration under Section 564(b)(1) of the Act, 21 U.S.C. section 360bbb-3(b)(1), unless the authorization is terminated or revoked.  Performed at [facility], [place] [address], [place], [place] [address]          Radiology Studies: CT HEAD WO CONTRAST (5MM)  Result Date: 08/18/2021 CLINICAL DATA:  Mental status change, failure to thrive for 1 week EXAM: CT HEAD WITHOUT CONTRAST TECHNIQUE: Contiguous axial images were obtained from the base of the skull through the vertex without intravenous contrast. COMPARISON:  05/04/2020 FINDINGS: Brain: No evidence of acute infarction, hemorrhage, extra-axial collection, ventriculomegaly, or mass effect. Generalized cerebral atrophy. Periventricular white matter low attenuation likely secondary to microangiopathy. Vascular: Cerebrovascular atherosclerotic calcifications are noted. Skull: Negative for fracture or focal lesion. Sinuses/Orbits: Visualized portions of the orbits are unremarkable. Visualized portions of the paranasal sinuses are unremarkable. Visualized portions of the mastoid air cells are unremarkable. Other: None. IMPRESSION: 1. No acute intracranial pathology. 2. Chronic microvascular disease and cerebral atrophy. Electronically Signed   By: Kathreen Devoid M.D.   On: 08/18/2021 14:08   DG Chest Portable 1 View  Result Date: 08/18/2021 CLINICAL DATA:  Shortness of breath, hypoxemia EXAM: PORTABLE CHEST 1 VIEW COMPARISON:  07/20/2021 FINDINGS: Small left pleural effusion. No right pleural effusion. No pneumothorax. Mild bilateral interstitial thickening. Stable cardiomegaly. Dual lead cardiac pacemaker. No acute osseous abnormality. IMPRESSION: Cardiomegaly with mild pulmonary vascular congestion. Electronically Signed   By: Kathreen Devoid M.D.   On: 08/18/2021 14:06    Scheduled  Meds:  amLODipine  2.5 mg Oral Daily   apixaban  2.5 mg Oral BID   carvedilol  6.25 mg Oral BID   levothyroxine  88 mcg Oral Q0600   sertraline  100 mg Oral Daily   Continuous Infusions:  dextrose 75 mL/hr at 08/19/21 0547    LOS: 0 days   Time spent: 49mn  Sidney Silberman C Sufyan Meidinger, DO [facility]  If 7PM-7AM, please contact night-coverage [email or web address]  08/19/2021, 7:51 AM

## 2021-08-20 DIAGNOSIS — E43 Unspecified severe protein-calorie malnutrition: Secondary | ICD-10-CM | POA: Insufficient documentation

## 2021-08-20 LAB — CBC
HCT: 40.6 % (ref 36.0–46.0)
Hemoglobin: 13 g/dL (ref 12.0–15.0)
MCH: 31.5 pg (ref 26.0–34.0)
MCHC: 32 g/dL (ref 30.0–36.0)
MCV: 98.3 fL (ref 80.0–100.0)
Platelets: 133 10*3/uL — ABNORMAL LOW (ref 150–400)
RBC: 4.13 MIL/uL (ref 3.87–5.11)
RDW: 15.2 % (ref 11.5–15.5)
WBC: 3.5 10*3/uL — ABNORMAL LOW (ref 4.0–10.5)
nRBC: 0 % (ref 0.0–0.2)

## 2021-08-20 LAB — BASIC METABOLIC PANEL
Anion gap: 6 (ref 5–15)
BUN: 41 mg/dL — ABNORMAL HIGH (ref 8–23)
CO2: 30 mmol/L (ref 22–32)
Calcium: 8.1 mg/dL — ABNORMAL LOW (ref 8.9–10.3)
Chloride: 99 mmol/L (ref 98–111)
Creatinine, Ser: 1.85 mg/dL — ABNORMAL HIGH (ref 0.44–1.00)
GFR, Estimated: 25 mL/min — ABNORMAL LOW (ref >60)
Glucose, Bld: 102 mg/dL — ABNORMAL HIGH (ref 70–99)
Potassium: 3.7 mmol/L (ref 3.5–5.1)
Sodium: 135 mmol/L (ref 135–145)

## 2021-08-20 MED ORDER — ENSURE ENLIVE PO LIQD
237.0000 mL | Freq: Three times a day (TID) | ORAL | Status: DC
  Administered 2021-08-20 – 2021-08-23 (×3): 237 mL via ORAL

## 2021-08-20 MED ORDER — ADULT MULTIVITAMIN W/MINERALS CH
1.0000 | ORAL_TABLET | Freq: Every day | ORAL | Status: DC
  Administered 2021-08-20 – 2021-08-23 (×4): 1 via ORAL
  Filled 2021-08-20 (×4): qty 1

## 2021-08-20 NOTE — Evaluation (Signed)
Physical Therapy Evaluation Patient Details Name: [name] MRN: [id number] DOB: 1923-11-24 Today's Date: 08/20/2021  History of Present Illness  87 y.o. female with medical history significant of allergy, microcytic anemia, anxiety, osteoarthritis, asthma, benign positional vertigo, cataracts, chronic diarrhea, IBS, stage IIIa CKD, diverticulosis, GERD, esophageal stricture, gout, hemorrhoids, hyperlipidemia, hypothyroidism,, diastolic dysfunction, sinus arrest, syncope and collapse, tachybradycardia syndrome with pacemaker placement, LBBB, persistent atrial fibrillation, hypertensive heart disease, peripheral vascular disease who is brought to the emergency department due to generalized weakness associated with severely decreased oral intake and episodes of confusion.  Clinical Impression  Pt admitted with above diagnosis.  PT requiring mod to min assist for transfers and bed mobility, globally weak and deconditioned. Recommend SNF unless family able to provide 24hour assist   Pt currently with functional limitations due to the deficits listed below (see PT Problem List). Pt will benefit from skilled PT to increase their independence and safety with mobility to allow discharge to the venue listed below.          Recommendations for follow up therapy are one component of a multi-disciplinary discharge planning process, led by the attending physician.  Recommendations may be updated based on patient status, additional functional criteria and insurance authorization.  Follow Up Recommendations SNF (HHPT if  family able to provide 24hour assist)    Equipment Recommendations  None recommended by PT    Recommendations for Other Services       Precautions / Restrictions Precautions Precautions: Fall Precaution Comments: currently on 2L O2, appears does not use O2 at baseline Restrictions Weight Bearing Restrictions: No      Mobility  Bed Mobility Overal bed mobility: Needs  Assistance Bed Mobility: Sit to Supine       Sit to supine: Min assist   General bed mobility comments: assist with LEs    Transfers Overall transfer level: Needs assistance Equipment used: Rolling walker (2 wheeled) Transfers: Sit to/from [facility] Sit to Stand: Min assist;Mod assist         General transfer comment: assist to power up from chair, cues for hand placement. assist to balance for stand pivot, pt closing eyes during transfer. progressed from heavy mod assist for balance to min assist during stand-step pivot  Ambulation/Gait Ambulation/Gait assistance: Min assist              Stairs            Wheelchair Mobility    Modified Rankin (Stroke Patients Only)       Balance Overall balance assessment: Needs assistance Sitting-balance support: No upper extremity supported;Feet supported Sitting balance-Leahy Scale: Fair       Standing balance-Leahy Scale: Poor Standing balance comment: reliant on external support and UE s                             Pertinent Vitals/Pain Pain Assessment: Faces Faces Pain Scale: Hurts a little bit Pain Location: non-specific Pain Descriptors / Indicators: Grimacing (with movement) Pain Intervention(s): Limited activity within patient's tolerance;Monitored during session;Repositioned    Home Living Family/patient expects to be discharged to:: Private residence Living Arrangements: Children Available Help at Discharge: Available 24 hours/day;Family   Home Access: Level entry     Home Layout: One level Home Equipment: Environmental consultant - 2 wheels;Cane - single point Additional Comments: pt is unrealiable historian at current time, states "I am in my apt"; no family present; some info abovetaken from previous admission  Prior Function           Comments: unsure of PLOF     Hand Dominance        Extremity/Trunk Assessment   Upper Extremity Assessment Upper Extremity  Assessment: Generalized weakness    Lower Extremity Assessment Lower Extremity Assessment: Generalized weakness       Communication   Communication: No difficulties  Cognition Arousal/Alertness: Awake/alert Behavior During Therapy: WFL for tasks assessed/performed Overall Cognitive Status: No family/caregiver present to determine baseline cognitive functioning Area of Impairment: Orientation;Following commands                 Orientation Level: Disoriented to;Place;Time     Following Commands: Follows one step commands with increased time              General Comments      Exercises     Assessment/Plan    PT Assessment Patient needs continued PT services  PT Problem List Decreased strength;Decreased mobility;Decreased activity tolerance;Decreased balance;Decreased cognition       PT Treatment Interventions DME instruction;Therapeutic activities;Gait training;Functional mobility training;Therapeutic exercise;Patient/family education;Balance training    PT Goals (Current goals can be found in the Care Plan section)  Acute Rehab PT Goals PT Goal Formulation: Patient unable to participate in goal setting Time For Goal Achievement: 09/03/21 Potential to Achieve Goals: Fair    Frequency Min 2X/week   Barriers to discharge        Co-evaluation               AM-PAC PT "6 Clicks" Mobility  Outcome Measure Help needed turning from your back to your side while in a flat bed without using bedrails?: A Lot Help needed moving from lying on your back to sitting on the side of a flat bed without using bedrails?: A Lot Help needed moving to and from a bed to a chair (including a wheelchair)?: A Lot Help needed standing up from a chair using your arms (e.g., wheelchair or bedside chair)?: A Lot Help needed to walk in hospital room?: A Lot Help needed climbing 3-5 steps with a railing? : Total 6 Click Score: 11    End of Session Equipment Utilized During  Treatment: Gait belt Activity Tolerance: Patient limited by fatigue Patient left: in bed;with call bell/phone within reach;with bed alarm set   PT Visit Diagnosis: Other abnormalities of gait and mobility (R26.89)    Time: [id number] PT Time Calculation (min) (ACUTE ONLY): 20 min   Charges:   PT Evaluation $PT Eval Low Complexity: [place], PT  Acute Rehab Dept ([place]) [phone] Pager [phone]  08/20/2021   [facility] 08/20/2021, 4:30 PM

## 2021-08-20 NOTE — Consult Note (Signed)
[facility] Inpatient Consult   08/20/2021  AIJAH WARFEL 03-14-1924 [id number]  Patient chart has been reviewed for readmissions less than 30 days and for high risk score for unplanned readmissions.  Patient assessed for community [place] Management follow up needs. Patient is currently active with [facility] CM care coordinator for chronic disease management services.   Plan: Will continue to follow for progression.  Of note, [facility] Care Management services does not replace or interfere with any services that are arranged by inpatient case management or social work.   Netta Cedars, MSN, [facility] Liaison Nurse Mobile Phone [phone]  Toll free office [phone]

## 2021-08-20 NOTE — Telephone Encounter (Signed)
Noted. We will see what she wants to do

## 2021-08-20 NOTE — Progress Notes (Signed)
Patient confused throughout the night to self only with periods of agitation, pulling off oxygen and heart monitor.  Patient quickly desats to low 80s and appears dusky and more confused when she removes oxygen.  Patient is on continuous pulse ox at the bedside.

## 2021-08-20 NOTE — Progress Notes (Signed)
SATURATION QUALIFICATIONS: (This note is used to comply with regulatory documentation for home oxygen)  Patient Saturations on Room Air at Rest = 94%  Patient Saturations on Room Air while Ambulating = 84%  Patient Saturations on 2 Liters of oxygen while Ambulating = 96%  Please briefly explain why patient needs home oxygen: Patient unable to maintain oxygen level greater than 90% on room air while ambulating.

## 2021-08-20 NOTE — Progress Notes (Signed)
PROGRESS NOTE    [name]  [id number] DOB: 07-23-1924 DOA: 08/18/2021 PCP: [name], MD   Brief Narrative:  [name] is a 87 y.o. female with medical history significant of allergy, microcytic anemia, anxiety, osteoarthritis, asthma, benign positional vertigo, cataracts, chronic diarrhea, IBS, stage IIIa CKD, diverticulosis, GERD, esophageal stricture, gout, hemorrhoids, hyperlipidemia, hypothyroidism,, diastolic dysfunction, sinus arrest, syncope and collapse, tachybradycardia syndrome with pacemaker placement, LBBB, persistent atrial fibrillation, hypertensive heart disease, peripheral vascular disease who is brought to the emergency department due to generalized weakness associated with severely decreased oral intake and episodes of confusion - admitted for failure to thrive.    Assessment & Plan:   AKI superimposed on CKD IV, resolving -Discontinue IV fluids, encourage increased p.o. intake -Creatinine downtrending slowly, follow repeat labs   Acute hypoxic respiratory failure, likely multifactorial, POA -Ongoing - unclear etiology, complicated by acute metabolic encephalopathy -Patient without overt findings on chest x-ray other than small left basilar atelectasis and effusion, not enough to significantly explain her hypoxia -Likely patient's mental status is primary driving factor of her hypoxia given improving mental status and able to wean oxygen over the past 24 hours quite aggressively, walk screen pending today with physical therapy  Elevated troponin -Type II in the setting of demand ischemia hypotension, hypoxia and dehydration -Without chest pain, no further work-up indicated at this time   Hypothyroidism -Continue levohyroxine 88 mcg daily.   Hyperlipidemia/Aortic atherosclerosis Not on current medical therapy likely in the setting of age and risks of polypharmacy Continue regular diet as tolerated   Persistent Paroxysmal atrial  fibrillation CHA2DS2-VASc Score 5 Continue Eliquis. Continue carvedilol as needed.   COPD (chronic obstructive pulmonary disease)  Supplemental oxygen as needed. Bronchodilators as needed.   Depression with anxiety Continue sertraline 100 mg p.o. bedtime. Hold benzodiazepines while in the hospital.     DVT prophylaxis:      On apixaban. Code Status:              DNR. Family Communication: None available  Status is: Inpatient  Dispo: The patient is from: Home              Anticipated d/c is to: To be determined              Anticipated d/c date is: 24 to 48 hours              Patient currently not medically stable for discharge  Consultants:  None  Procedures:  None  Antimicrobials:  None  Subjective: No acute issues, events, or problems overnight.  Patient somnolent this morning but arousable, difficulty answering orientation questions or review of systems questions but responds appropriately to conversation and simple questions  Objective: Vitals:   08/19/21 1957 08/19/21 2300 08/19/21 2340 08/20/21 0455  BP: (!) 165/68   138/61  Pulse: 61   67  Resp: 16   20  Temp: 98.3 F (36.8 C)   98.9 F (37.2 C)  TempSrc: Oral   Oral  SpO2: 90% 100% 100% 100%  Weight:      Height:       No intake or output data in the 24 hours ending 08/20/21 0808  Filed Weights   08/19/21 [id number]  Weight: 49.9 kg    Examination:  General: Cachectic appearing elderly female resting comfortably in bed no acute distress.  Somnolent but arousable oriented to person HEENT:  Normocephalic atraumatic.  Sclerae nonicteric, noninjected.  Extraocular movements intact bilaterally. Neck:  Without mass  or deformity.  Trachea is midline. Lungs:  Clear to auscultate bilaterally without rhonchi, wheeze, or rales. Heart: Irregularly irregular.  Without murmurs, rubs, or gallops. Abdomen:  Soft, nontender, nondistended.  Without guarding or rebound. Extremities: Without cyanosis, clubbing, edema,  or obvious deformity. Vascular:  Dorsalis pedis and posterior tibial pulses palpable bilaterally. Skin:  Warm and dry, no erythema, no ulcerations.  Data Reviewed: I have personally reviewed following labs and imaging studies  CBC: Recent Labs  Lab 08/18/21 1318 08/19/21 0615  WBC 4.4 3.0*  NEUTROABS 3.2  --   HGB 14.3 12.0  HCT 46.0 38.1  MCV 101.3* 98.4  PLT 160 124*    Basic Metabolic Panel: Recent Labs  Lab 08/18/21 1318 08/19/21 0347  NA 137 135  K 4.1 3.7  CL 95* 97*  CO2 30 27  GLUCOSE 104* 101*  BUN 42* 41*  CREATININE 2.04* 2.06*  CALCIUM 8.9 8.1*    GFR: Estimated Creatinine Clearance: 13.2 mL/min (A) (by C-G formula based on SCr of 2.06 mg/dL (H)). Liver Function Tests: Recent Labs  Lab 08/18/21 1318  AST 69*  ALT 43  ALKPHOS 54  BILITOT 1.0  PROT 7.6  ALBUMIN 4.5    Recent Labs  Lab 08/18/21 1318  LIPASE 42    No results for input(s): AMMONIA in the last 168 hours. Coagulation Profile: No results for input(s): INR, PROTIME in the last 168 hours. Cardiac Enzymes: No results for input(s): CKTOTAL, CKMB, CKMBINDEX, TROPONINI in the last 168 hours. BNP (last 3 results) No results for input(s): PROBNP in the last 8760 hours. HbA1C: No results for input(s): HGBA1C in the last 72 hours. CBG: No results for input(s): GLUCAP in the last 168 hours. Lipid Profile: No results for input(s): CHOL, HDL, LDLCALC, TRIG, CHOLHDL, LDLDIRECT in the last 72 hours. Thyroid Function Tests: Recent Labs    08/18/21 1310  TSH 1.828    Anemia Panel: No results for input(s): VITAMINB12, FOLATE, FERRITIN, TIBC, IRON, RETICCTPCT in the last 72 hours. Sepsis Labs: No results for input(s): PROCALCITON, LATICACIDVEN in the last 168 hours.  Recent Results (from the past 240 hour(s))  Resp Panel by RT-PCR (Flu A&B, Covid) Nasopharyngeal Swab     Status: None   Collection Time: 08/18/21  1:18 PM   Specimen: Nasopharyngeal Swab; Nasopharyngeal(NP) swabs in  vial transport medium  Result Value Ref Range Status   SARS Coronavirus 2 by RT PCR NEGATIVE NEGATIVE Final    Comment: (NOTE) SARS-CoV-2 target nucleic acids are NOT DETECTED.  The SARS-CoV-2 RNA is generally detectable in upper respiratory specimens during the acute phase of infection. The lowest concentration of SARS-CoV-2 viral copies this assay can detect is 138 copies/mL. A negative result does not preclude SARS-Cov-2 infection and should not be used as the sole basis for treatment or other patient management decisions. A negative result may occur with  improper specimen collection/handling, submission of specimen other than nasopharyngeal swab, presence of viral mutation(s) within the areas targeted by this assay, and inadequate number of viral copies(<138 copies/mL). A negative result must be combined with clinical observations, patient history, and epidemiological information. The expected result is Negative.  Fact Sheet for Patients:  EntrepreneurPulse.com.au  Fact Sheet for Healthcare Providers:  IncredibleEmployment.be  This test is no t yet approved or cleared by the [place] FDA and  has been authorized for detection and/or diagnosis of SARS-CoV-2 by FDA under an Emergency Use Authorization (EUA). This EUA will remain  in effect (meaning this test can be  used) for the duration of the COVID-19 declaration under Section 564(b)(1) of the Act, 21 U.S.C.section 360bbb-3(b)(1), unless the authorization is terminated  or revoked sooner.       Influenza A by PCR NEGATIVE NEGATIVE Final   Influenza B by PCR NEGATIVE NEGATIVE Final    Comment: (NOTE) The Xpert Xpress SARS-CoV-2/FLU/RSV plus assay is intended as an aid in the diagnosis of influenza from Nasopharyngeal swab specimens and should not be used as a sole basis for treatment. Nasal washings and aspirates are unacceptable for Xpert Xpress SARS-CoV-2/FLU/RSV testing.  Fact  Sheet for Patients: EntrepreneurPulse.com.au  Fact Sheet for Healthcare Providers: IncredibleEmployment.be  This test is not yet approved or cleared by the [place] FDA and has been authorized for detection and/or diagnosis of SARS-CoV-2 by FDA under an Emergency Use Authorization (EUA). This EUA will remain in effect (meaning this test can be used) for the duration of the COVID-19 declaration under Section 564(b)(1) of the Act, 21 U.S.C. section 360bbb-3(b)(1), unless the authorization is terminated or revoked.  Performed at [facility], [place] [address], [place], [place] [address]   Urine Culture     Status: Abnormal (Preliminary result)   Collection Time: 08/19/21 12:30 AM   Specimen: Urine, Clean Catch  Result Value Ref Range Status   Specimen Description   Final    URINE, CLEAN CATCH Performed at [facility], [place] [address] [place], [place] [address]    Special Requests   Final    NONE Performed at [facility], [place] [address], [place], [place] [address]    Culture (A)  Final    >=100,000 COLONIES/mL ESCHERICHIA COLI SUSCEPTIBILITIES TO FOLLOW Performed at [facility], [place] [address], [place], [place] [address]    Report Status PENDING  Incomplete          Radiology Studies: DG Chest 1 View  Result Date: 08/19/2021 CLINICAL DATA:  Low O2 sats, crackles EXAM: CHEST  1 VIEW COMPARISON:  08/18/2021 FINDINGS: Cardiomegaly. Aortic calcifications. Left pacer remains in place, unchanged. Left pleural effusion with left lower lobe atelectasis or infiltrate, similar to prior study. No right effusion or focal opacity. Mild vascular congestion. IMPRESSION: Cardiomegaly, vascular congestion. Small left pleural effusion with left base atelectasis or infiltrate. Electronically Signed   By: Rolm Baptise M.D.   On: 08/19/2021 22:38   CT HEAD WO CONTRAST (5MM)  Result Date:  08/18/2021 CLINICAL DATA:  Mental status change, failure to thrive for 1 week EXAM: CT HEAD WITHOUT CONTRAST TECHNIQUE: Contiguous axial images were obtained from the base of the skull through the vertex without intravenous contrast. COMPARISON:  05/04/2020 FINDINGS: Brain: No evidence of acute infarction, hemorrhage, extra-axial collection, ventriculomegaly, or mass effect. Generalized cerebral atrophy. Periventricular white matter low attenuation likely secondary to microangiopathy. Vascular: Cerebrovascular atherosclerotic calcifications are noted. Skull: Negative for fracture or focal lesion. Sinuses/Orbits: Visualized portions of the orbits are unremarkable. Visualized portions of the paranasal sinuses are unremarkable. Visualized portions of the mastoid air cells are unremarkable. Other: None. IMPRESSION: 1. No acute intracranial pathology. 2. Chronic microvascular disease and cerebral atrophy. Electronically Signed   By: Kathreen Devoid M.D.   On: 08/18/2021 14:08   DG Chest Portable 1 View  Result Date: 08/18/2021 CLINICAL DATA:  Shortness of breath, hypoxemia EXAM: PORTABLE CHEST 1 VIEW COMPARISON:  07/20/2021 FINDINGS: Small left pleural effusion. No right pleural effusion. No pneumothorax. Mild bilateral interstitial thickening. Stable cardiomegaly. Dual lead cardiac pacemaker. No acute osseous abnormality.  IMPRESSION: Cardiomegaly with mild pulmonary vascular congestion. Electronically Signed   By: Kathreen Devoid M.D.   On: 08/18/2021 14:06    Scheduled Meds:  amLODipine  2.5 mg Oral Daily   apixaban  2.5 mg Oral BID   carvedilol  6.25 mg Oral BID   chlorhexidine  15 mL Mouth Rinse BID   levothyroxine  88 mcg Oral Q0600   mouth rinse  15 mL Mouth Rinse q12n4p   sertraline  100 mg Oral Daily   Continuous Infusions:    LOS: 1 day   Time spent: 12mn  Lindsey Nancarrow C Christino Mcglinchey, DO [facility]  If 7PM-7AM, please contact night-coverage [email or web address]  08/20/2021, 8:08 AM

## 2021-08-20 NOTE — Progress Notes (Signed)
Initial Nutrition Assessment  DOCUMENTATION CODES:   Severe malnutrition in context of chronic illness  INTERVENTION:  - will order Ensure Plus TID, each supplement provides 350 kcal and 20 grams of protein. - will order Magic Cup BID with meals, each supplement provides 290 kcal and 9 grams of protein. - will order 1 tablet multivitamin with minerals/day.   NUTRITION DIAGNOSIS:   Severe Malnutrition related to chronic illness as evidenced by severe fat depletion, severe muscle depletion.  GOAL:   Patient will meet greater than or equal to 90% of their needs  MONITOR:   PO intake, Supplement acceptance, Labs, Weight trends  REASON FOR ASSESSMENT:   Malnutrition Screening Tool, Consult Assessment of nutrition requirement/status  ASSESSMENT:   87 y.o. female with medical history of HLD, hypothyroidism, arthritis, anemia, asthma, IBS, gout, hemorrhoids, diverticulosis, chronic diarrhea, stage 4 CKD, GERD, heart murmur, esophageal stricture s/p dilation, aortic atherosclerosis, afib, and PVD. She presented to the ED due to generalized weakness, severely decreased oral intake, and episodes of confusion.  She ate 75% of breakfast this AM. Patient is noted to be a/o to person and place only. She was unable to provide information, but her son was at bedside and provided all information.  Patient has been living at his house on and off over the past 4-5 months. Most recently, she has been there for 2 weeks. She requires frequent reminders and encouragement to take PO medications and to eat and drink. During the past 1 week she was refusing foods and drinks and would only take a few bites/day.   Patient has no chewing difficulties, but does intermittently have difficulty swallowing solid foods as she reports they get stuck in her throat. She will then tell family that she needs esophagus stretched again. Patient is able to feed herself once items are cut into bite-sized pieces.   Her  weight ~1 year ago was 125-130 lb and she has been steadily losing weight since that time. Weight yesterday was 110 lb and weight on 04/11/21 was also 110 lb.   Per notes: - AKI on stage 4 CKD - from home with unclear plan for disposition at time of d/c   Labs reviewed; BUN: 41 mg/dl, creatinine: 1.85 mg/dl, Ca: 8.1 mg/dl, GFR: 25 ml/min.  Medications reviewed; 88 mcg oral synthroid/day.    NUTRITION - FOCUSED PHYSICAL EXAM:  Flowsheet Row Most Recent Value  Orbital Region Moderate depletion  Upper Arm Region Severe depletion  Thoracic and Lumbar Region Severe depletion  Buccal Region Severe depletion  Temple Region Moderate depletion  Clavicle Bone Region Severe depletion  Clavicle and Acromion Bone Region Severe depletion  Scapular Bone Region Unable to assess  Dorsal Hand Severe depletion  Patellar Region Unable to assess  Anterior Thigh Region Unable to assess  Posterior Calf Region Unable to assess  Edema (RD Assessment) Unable to assess  Hair Reviewed  Eyes Reviewed  Mouth Reviewed  Skin Reviewed  Nails Reviewed       Diet Order:   Diet Order             Diet regular Room service appropriate? Yes; Fluid consistency: Thin  Diet effective now                   EDUCATION NEEDS:   No education needs have been identified at this time  Skin:  Skin Assessment: Reviewed RN Assessment  Last BM:  10/9  Height:   Ht Readings from Last 1 Encounters:  08/19/21 5'  2" (1.575 m)    Weight:   Wt Readings from Last 1 Encounters:  08/19/21 49.9 kg     Estimated Nutritional Needs:  Kcal:  1100-1350 kcal Protein:  55-70 grams Fluid:  >/= 1.7 L/day     Jarome Matin, MS, RD, LDN, CNSC Inpatient Clinical Dietitian RD pager # available in AMION  After hours/weekend pager # available in [facility]

## 2021-08-21 LAB — CBC
HCT: 41.4 % (ref 36.0–46.0)
Hemoglobin: 13.1 g/dL (ref 12.0–15.0)
MCH: 31.4 pg (ref 26.0–34.0)
MCHC: 31.6 g/dL (ref 30.0–36.0)
MCV: 99.3 fL (ref 80.0–100.0)
Platelets: 123 10*3/uL — ABNORMAL LOW (ref 150–400)
RBC: 4.17 MIL/uL (ref 3.87–5.11)
RDW: 15.1 % (ref 11.5–15.5)
WBC: 3.3 10*3/uL — ABNORMAL LOW (ref 4.0–10.5)
nRBC: 0 % (ref 0.0–0.2)

## 2021-08-21 LAB — URINE CULTURE: Culture: >=100000 — AB

## 2021-08-21 LAB — BASIC METABOLIC PANEL
Anion gap: 8 (ref 5–15)
BUN: 42 mg/dL — ABNORMAL HIGH (ref 8–23)
CO2: 31 mmol/L (ref 22–32)
Calcium: 8.1 mg/dL — ABNORMAL LOW (ref 8.9–10.3)
Chloride: 94 mmol/L — ABNORMAL LOW (ref 98–111)
Creatinine, Ser: 1.73 mg/dL — ABNORMAL HIGH (ref 0.44–1.00)
GFR, Estimated: 27 mL/min — ABNORMAL LOW (ref >60)
Glucose, Bld: 93 mg/dL (ref 70–99)
Potassium: 3.9 mmol/L (ref 3.5–5.1)
Sodium: 133 mmol/L — ABNORMAL LOW (ref 135–145)

## 2021-08-21 MED ORDER — TEMAZEPAM 15 MG PO CAPS
30.0000 mg | ORAL_CAPSULE | Freq: Once | ORAL | Status: AC
  Administered 2021-08-21: 30 mg via ORAL
  Filled 2021-08-21: qty 2

## 2021-08-21 NOTE — Progress Notes (Signed)
PHYSICAL THERAPY  SATURATION QUALIFICATIONS: (This note is used to comply with regulatory documentation for home oxygen)  Patient Saturations on Room Air at Rest =94%  Patient Saturations on Room Air while Ambulating 35 feet = 85%  Patient Saturations on 1 Liters of oxygen while Ambulating = 90%  Please briefly explain why patient needs home oxygen:  No obvious dyspnea or c/o but RA did decrease to 85% amb 35 feet.  Pt required 1 lt supplemental oxygen during activity.   Rica Koyanagi  PTA Acute  Rehabilitation Services Pager      [phone] Office      [phone]

## 2021-08-21 NOTE — NC FL2 (Signed)
[place] LEVEL OF CARE SCREENING TOOL     IDENTIFICATION  Patient Name: [name] Birthdate: Nov 04, 1924 Sex: female Admission Date (Current Location): 08/18/2021  [facility] and [place] Number:  Herbalist and Address:  [facility],  [place], [place]      Provider Number: [id number]  Attending Physician Name and Address:  Little Ishikawa, MD  Relative Name and Phone Number:  Lorine, Wollan Relative [id number]  [phone]  Jeffie, Clara [id number]  [phone]    Current Level of Care: Hospital Recommended Level of Care: [place] Prior Approval Number:    Date Approved/Denied:   PASRR Number: [id number]  Discharge Plan: SNF    Current Diagnoses: Patient Active Problem List   Diagnosis Date Noted   Protein-calorie malnutrition, severe 08/20/2021   AKI (acute kidney injury) ([place]) 08/19/2021   Generalized weakness 08/18/2021   Hypertensive urgency 07/21/2021   Elevated troponin 07/21/2021   Aortic atherosclerosis ([place]) 07/21/2021   Chest pain 07/20/2021   Depression with anxiety 05/16/2020   Orthostatic dizziness 04/21/2019   Malnutrition of moderate degree 12/13/2017   Pressure injury of skin 12/12/2017   Hypokalemia 12/11/2017   Acute kidney injury superimposed on CKD ([place]) 12/11/2017   COPD (chronic obstructive pulmonary disease) ([place]) 12/11/2017   Hypocalcemia 12/11/2017   Hypoxia 12/05/2017   Chronic back pain 07/16/2017   Iron deficiency anemia 03/24/2017   IDA (iron deficiency anemia) 03/20/2017   Dyspnea on exertion 02/21/2017   Insomnia, persistent 07/26/2015   Cardiac pacemaker in situ    Right bundle branch block (RBBB) intermittent  04/28/2015   Closed left hip fracture (HCC)    Syncope, cardiogenic    Abnormal liver function tests 11/10/2014   Cauda equina syndrome with neurogenic bladder ([place]) 10/04/2014   CKD (chronic kidney disease), stage IV ([place]) 07/31/2014    Gout 06/17/2014   Edema of both legs 02/09/2014   Hypertensive heart and chronic kidney disease with chronic diastolic congestive heart failure ([place]) 12/03/2013   H/O tachycardia-bradycardia syndrome    Chronic anticoagulation- Eliquis 11/17/2013   LBBB (left bundle branch block)- new 11/01/13 11/01/2013   Persistent Paroxysmal atrial fibrillation: CHA2DS2-VASc Score 5, on Eliquis 11/01/2013   Peripheral vascular disease    Benign positional vertigo    Irritable bowel syndrome 03/22/2009   Anxiety state    Reactive airway disease 01/13/2008   Chronic anemia    Osteoarthritis    Hypothyroidism    Hyperlipidemia     Orientation RESPIRATION BLADDER Height & Weight     Self, Place  O2 (2L) Continent Weight: 110 lb 0.2 oz (49.9 kg) Height:  '5\' 2"'$  (157.5 cm)  BEHAVIORAL SYMPTOMS/MOOD NEUROLOGICAL BOWEL NUTRITION STATUS      Continent Diet (Regular diet)  AMBULATORY STATUS COMMUNICATION OF NEEDS Skin   Limited Assist Verbally Normal                       Personal Care Assistance Level of Assistance  Bathing, Feeding, Dressing Bathing Assistance: Limited assistance Feeding assistance: Independent Dressing Assistance: Limited assistance     Functional Limitations Info  Sight, Speech, Hearing Sight Info: Adequate Hearing Info: Adequate Speech Info: Adequate    SPECIAL CARE FACTORS FREQUENCY  OT (By licensed OT), PT (By licensed PT)     PT Frequency: Minimum 5x a week OT Frequency: Minimum 5x a week            Contractures Contractures Info: Not present  Additional Factors Info  Allergies, Code Status, Psychotropic Code Status Info: DNR Allergies Info: Penicillins   Xifaxan (Rifaximin)   Ambien (Zolpidem Tartrate)   Amoxicillin   Ceftin (Cefuroxime Axetil)   Codeine Phosphate   Colchicine   Levofloxacin   Lipitor (Atorvastatin) Psychotropic Info: sertraline (ZOLOFT) tablet 100 mg         Current Medications (08/21/2021):  This is the current hospital  active medication list Current Facility-Administered Medications  Medication Dose Route Frequency Provider Last Rate Last Admin   acetaminophen (TYLENOL) tablet 650 mg  650 mg Oral Q6H PRN Reubin Milan, MD   650 mg at 08/19/21 2100   Or   acetaminophen (TYLENOL) suppository 650 mg  650 mg Rectal Q6H PRN Reubin Milan, MD       amLODipine (NORVASC) tablet 2.5 mg  2.5 mg Oral Daily Reubin Milan, MD   2.5 mg at 08/21/21 [id number]   apixaban (ELIQUIS) tablet 2.5 mg  2.5 mg Oral BID Reubin Milan, MD   2.5 mg at 08/21/21 0859   carvedilol (COREG) tablet 6.25 mg  6.25 mg Oral BID Reubin Milan, MD   6.25 mg at 08/20/21 1023   chlorhexidine (PERIDEX) 0.12 % solution 15 mL  15 mL Mouth Rinse BID Little Ishikawa, MD   15 mL at 08/21/21 0901   feeding supplement (ENSURE ENLIVE / ENSURE PLUS) liquid 237 mL  237 mL Oral TID BM Little Ishikawa, MD   237 mL at 08/20/21 2114   levothyroxine (SYNTHROID) tablet 88 mcg  88 mcg Oral Q0600 Reubin Milan, MD   88 mcg at 08/21/21 0506   MEDLINE mouth rinse  15 mL Mouth Rinse q12n4p Little Ishikawa, MD   15 mL at 08/20/21 1708   multivitamin with minerals tablet 1 tablet  1 tablet Oral Daily Little Ishikawa, MD   1 tablet at 08/21/21 0901   nitroGLYCERIN (NITROSTAT) SL tablet 0.4 mg  0.4 mg Sublingual Q5 Min x 3 PRN Reubin Milan, MD       ondansetron [facility]) tablet 4 mg  4 mg Oral Q6H PRN Reubin Milan, MD       Or   ondansetron [facility]) injection 4 mg  4 mg Intravenous Q6H PRN Reubin Milan, MD       sertraline (ZOLOFT) tablet 100 mg  100 mg Oral Daily Reubin Milan, MD   100 mg at 08/21/21 [id number]     Discharge Medications: Please see discharge summary for a list of discharge medications.  Relevant Imaging Results:  Relevant Lab Results:   Additional Information SSN [id number]  Ross Ludwig, LCSW

## 2021-08-21 NOTE — TOC Progression Note (Signed)
Transition of Care [facility]) - Progression Note    Patient Details  Name: [name] MRN: [id number] Date of Birth: 1924-03-02  [facility] CM/SW Contact  Ross Ludwig, [place] Phone Number: 08/21/2021, 5:45 PM  Clinical Narrative:     CSW spoke to patient's son [name], and discussed bed offers.  CSW informed him that [facility] can accept patient once she is medically ready for discharge.  CSW started insurance authorization, authorization approved for patient to go to SNF.  Reference number [id number], patient approved for 3 days initially next review 10/14.  CSW updated SNF, attending physician, and bedside nurse.  CSW requested a new Covid test.  CSW to continue to follow patient's progress throughout discharge planning.   Expected Discharge Plan: [place] Barriers to Discharge: Continued Medical Work up  Expected Discharge Plan and Services Expected Discharge Plan: [place] arrangements for the past 2 months: Single Family Home                                       Social Determinants of Health (SDOH) Interventions    Readmission Risk Interventions No flowsheet data found.

## 2021-08-21 NOTE — TOC Initial Note (Signed)
Transition of Care [facility]) - Initial/Assessment Note    Patient Details  Name: [name] MRN: [id number] Date of Birth: [birth year]  Transition of [facility]) CM/SW Contact:    Ross Ludwig, LCSW Phone Number: 08/21/2021, 10:34 AM  Clinical Narrative:                  Patient is a 87 year old female who is alert and oriented x4.  Patient lives with her son, and plan is to go to SNF for short term rehab, then return back home.  Patient is normally able to ambulate at home with a walker per patient's son.  They are not able to provide 24 hour care, and patient is currently a mod to maximum assistance for transfers.  Patient also currently on oxygen, which normally she is not on oxygen at all.  CSW spoke to patient's son and he said patient has been at [facility] and they would like her to return there if possible.  CSW informed patient's son, it will depend on bed availability and insurance approval.  CSW explained that patient will need insurance authorization before she is able to discharge to SNF.  Patient's son expressed understanding, CSW to begin bed search in [facility].  Expected Discharge Plan: Skilled Nursing Facility Barriers to Discharge: Continued Medical Work up   Patient Goals and CMS Choice Patient states their goals for this hospitalization and ongoing recovery are:: To go to SNF for short term rehab, then return back home. CMS Medicare.gov Compare Post Acute Care list provided to:: Patient Represenative (must comment) Choice offered to / list presented to : Adult Children  Expected Discharge Plan and Services Expected Discharge Plan: [place] arrangements for the past 2 months: Single Family Home                                      Prior Living Arrangements/Services Living arrangements for the past 2 months: Single Family Home Lives with:: Adult Children Patient language and need for interpreter reviewed::  Yes Do you feel safe going back to the place where you live?: No   Patient and family feel she needs short term rehab, before returning back home.  Need for Family Participation in Patient Care: Yes (Comment) Care giver support system in place?: No (comment)   Criminal Activity/Legal Involvement Pertinent to Current Situation/Hospitalization: No - Comment as needed  Activities of Daily Living Home Assistive Devices/Equipment: Walker (specify type) ADL Screening (condition at time of admission) Patient's cognitive ability adequate to safely complete daily activities?: Yes Is the patient deaf or have difficulty hearing?: Yes Does the patient have difficulty seeing, even when wearing glasses/contacts?: No Does the patient have difficulty concentrating, remembering, or making decisions?: No Patient able to express need for assistance with ADLs?: Yes Does the patient have difficulty dressing or bathing?: Yes Independently performs ADLs?: No Communication: Independent Dressing (OT): Needs assistance Is this a change from baseline?: Change from baseline, expected to last <3days Grooming: Independent Feeding: Independent Bathing: Needs assistance Is this a change from baseline?: Change from baseline, expected to last <3 days Toileting: Needs assistance Is this a change from baseline?: Change from baseline, expected to last <3 days In/Out Bed: Needs assistance Is this a change from baseline?: Change from baseline, expected to last <3 days Walks in Home: Needs assistance Is this a change  from baseline?: Change from baseline, expected to last <3 days Does the patient have difficulty walking or climbing stairs?: Yes Weakness of Legs: Both Weakness of Arms/Hands: Both  Permission Sought/Granted Permission sought to share information with : Case Manager, Customer service manager, Family Supports Permission granted to share information with : Yes, Release of Information Signed  Share  Information with NAME: Rowley,Donna Relative [phone]  [phone]  Triena, Felten [id number]  [phone]  Permission granted to share info w AGENCY: SNF admissions        Emotional Assessment Appearance:: Appears stated age   Affect (typically observed): Appropriate, Calm, Accepting Orientation: : Oriented to Self, Oriented to Place Alcohol / Substance Use: Not Applicable Psych Involvement: No (comment)  Admission diagnosis:  Hypoxemia [R09.02] Generalized weakness [R53.1] AKI (acute kidney injury) ([place]) [N17.9] Patient Active Problem List   Diagnosis Date Noted   Protein-calorie malnutrition, severe 08/20/2021   AKI (acute kidney injury) ([place]) 08/19/2021   Generalized weakness 08/18/2021   Hypertensive urgency 07/21/2021   Elevated troponin 07/21/2021   Aortic atherosclerosis ([place]) 07/21/2021   Chest pain 07/20/2021   Depression with anxiety 05/16/2020   Orthostatic dizziness 04/21/2019   Malnutrition of moderate degree 12/13/2017   Pressure injury of skin 12/12/2017   Hypokalemia 12/11/2017   Acute kidney injury superimposed on CKD ([place]) 12/11/2017   COPD (chronic obstructive pulmonary disease) ([place]) 12/11/2017   Hypocalcemia 12/11/2017   Hypoxia 12/05/2017   Chronic back pain 07/16/2017   Iron deficiency anemia 03/24/2017   IDA (iron deficiency anemia) 03/20/2017   Dyspnea on exertion 02/21/2017   Insomnia, persistent 07/26/2015   Cardiac pacemaker in situ    Right bundle branch block (RBBB) intermittent  04/28/2015   Closed left hip fracture (HCC)    Syncope, cardiogenic    Abnormal liver function tests 11/10/2014   Cauda equina syndrome with neurogenic bladder ([place]) 10/04/2014   CKD (chronic kidney disease), stage IV ([place]) 07/31/2014   Gout 06/17/2014   Edema of both legs 02/09/2014   Hypertensive heart and chronic kidney disease with chronic diastolic congestive heart failure ([place]) 12/03/2013   H/O tachycardia-bradycardia syndrome    Chronic  anticoagulation- Eliquis 11/17/2013   LBBB (left bundle branch block)- new 11/01/13 11/01/2013   Persistent Paroxysmal atrial fibrillation: CHA2DS2-VASc Score 5, on Eliquis 11/01/2013   Peripheral vascular disease    Benign positional vertigo    Irritable bowel syndrome 03/22/2009   Anxiety state    Reactive airway disease 01/13/2008   Chronic anemia    Osteoarthritis    Hypothyroidism    Hyperlipidemia    PCP:  [name], MD Pharmacy:   [id number] [address], [place] [address] AT [facility] [address] [place] [address] Phone: [phone] Fax: [phone]     Social Determinants of Health (SDOH) Interventions    Readmission Risk Interventions No flowsheet data found.

## 2021-08-21 NOTE — Progress Notes (Signed)
PROGRESS NOTE    [name]  [id number] DOB: Nov 09, 1924 DOA: 08/18/2021 PCP: [name], MD   Brief Narrative:  [name] is a 87 y.o. female with medical history significant of allergy, microcytic anemia, anxiety, osteoarthritis, asthma, benign positional vertigo, cataracts, chronic diarrhea, IBS, stage IIIa CKD, diverticulosis, GERD, esophageal stricture, gout, hemorrhoids, hyperlipidemia, hypothyroidism,, diastolic dysfunction, sinus arrest, syncope and collapse, tachybradycardia syndrome with pacemaker placement, LBBB, persistent atrial fibrillation, hypertensive heart disease, peripheral vascular disease who is brought to the emergency department due to generalized weakness associated with severely decreased oral intake and episodes of confusion - admitted for failure to thrive.  Assessment & Plan:   AKI superimposed on CKD IV, resolving - Discontinue IV fluids, encourage increased p.o. intake - Creatinine downtrending slowly, follow repeat labs  Acute hypoxic respiratory failure, likely multifactorial, POA - Ongoing - unclear etiology, complicated by acute metabolic encephalopathy -O2 sats high 90s despite supplemental oxygen, continue aggressively wean - Patient without overt findings on chest x-ray other than small left basilar atelectasis and effusion, not significant enough to explain her hypoxia - Likely patient's mental status is primary driving factor of her hypoxia given improving mental status and able to wean oxygen over the past 24 hours quite aggressively, walk screen pending physical therapy  Elevated troponin - Type II in the setting of demand ischemia hypotension, hypoxia and dehydration - Without chest pain, no further work-up indicated at this time   Hypothyroidism - Continue levohyroxine 88 mcg daily.   Hyperlipidemia/Aortic atherosclerosis - Not on current medical therapy likely in the setting of age and risks of polypharmacy - Continue  regular diet as tolerated   Persistent Paroxysmal atrial fibrillation - CHA2DS2-VASc Score 5 - Continue Eliquis. - Continue carvedilol as needed.   COPD (chronic obstructive pulmonary disease)  - Supplemental oxygen as needed. - Bronchodilators as needed.   Depression with anxiety - Continue sertraline 100 mg p.o. bedtime. - Hold benzodiazepines while in the hospital.   DVT prophylaxis: On apixaban. Code Status:  DNR. Family Communication: None available  Status is: Inpatient  Dispo: The patient is from: Home              Anticipated d/c is to: SNF based on discussion with family, PT              Anticipated d/c date is: 24 to 48 hours              Patient currently not medically stable for discharge  Consultants:  None  Procedures:  None  Antimicrobials:  None  Subjective: No acute issues, events, or problems overnight.  Patient much more awake alert oriented to person and general situation today back to baseline per daughter at bedside.  Otherwise denies nausea vomiting diarrhea constipation headache fevers chills or chest pain  Objective: Vitals:   08/20/21 2008 08/21/21 0406 08/21/21 0454 08/21/21 0728  BP: 130/61 (!) 117/50    Pulse: (!) 59 (!) 58 90   Resp: 18 18    Temp: 97.6 F (36.4 C) (!) 97.5 F (36.4 C)    TempSrc: Oral Oral    SpO2: 100% (!) 89% 99% 94%  Weight:      Height:        Intake/Output Summary (Last 24 hours) at 08/21/2021 0743 Last data filed at 08/21/2021 0507 Gross per 24 hour  Intake 660 ml  Output 300 ml  Net 360 ml    Filed Weights   08/19/21 0632  Weight: 49.9 kg  Examination:  General: Cachectic appearing elderly female resting comfortably in bed no acute distress. Awake, alert, oriented to person HEENT:  Normocephalic atraumatic.  Sclerae nonicteric, noninjected. Extraocular movements intact bilaterally. Neck: Without mass or deformity. Trachea is midline. Lungs: Clear to auscultate bilaterally without rhonchi,  wheeze, or rales. Heart: Irregularly irregular.  Without murmurs, rubs, or gallops. Abdomen: Soft, nontender, nondistended.  Without guarding or rebound. Extremities: Without cyanosis, clubbing, edema, or obvious deformity. Vascular: Dorsalis pedis and posterior tibial pulses palpable bilaterally. Skin: Warm and dry, no erythema, no ulcerations.  Data Reviewed: I have personally reviewed following labs and imaging studies  CBC: Recent Labs  Lab 08/18/21 1318 08/19/21 0615 08/20/21 0854 08/21/21 0333  WBC 4.4 3.0* 3.5* 3.3*  NEUTROABS 3.2  --   --   --   HGB 14.3 12.0 13.0 13.1  HCT 46.0 38.1 40.6 41.4  MCV 101.3* 98.4 98.3 99.3  PLT 160 124* 133* 123*    Basic Metabolic Panel: Recent Labs  Lab 08/18/21 1318 08/19/21 0347 08/20/21 0854 08/21/21 0333  NA 137 135 135 133*  K 4.1 3.7 3.7 3.9  CL 95* 97* 99 94*  CO2 '30 27 30 31  '$ GLUCOSE 104* 101* 102* 93  BUN 42* 41* 41* 42*  CREATININE 2.04* 2.06* 1.85* 1.73*  CALCIUM 8.9 8.1* 8.1* 8.1*   GFR: Estimated Creatinine Clearance: 15.7 mL/min (A) (by C-G formula based on SCr of 1.73 mg/dL (H)).  Liver Function Tests: Recent Labs  Lab 08/18/21 1318  AST 69*  ALT 43  ALKPHOS 54  BILITOT 1.0  PROT 7.6  ALBUMIN 4.5   Recent Labs  Lab 08/18/21 1318  LIPASE 42   No results for input(s): AMMONIA in the last 168 hours. Coagulation Profile: No results for input(s): INR, PROTIME in the last 168 hours. Cardiac Enzymes: No results for input(s): CKTOTAL, CKMB, CKMBINDEX, TROPONINI in the last 168 hours. BNP (last 3 results) No results for input(s): PROBNP in the last 8760 hours. HbA1C: No results for input(s): HGBA1C in the last 72 hours. CBG: No results for input(s): GLUCAP in the last 168 hours. Lipid Profile: No results for input(s): CHOL, HDL, LDLCALC, TRIG, CHOLHDL, LDLDIRECT in the last 72 hours. Thyroid Function Tests: Recent Labs    08/18/21 1310  TSH 1.828   Anemia Panel: No results for input(s):  VITAMINB12, FOLATE, FERRITIN, TIBC, IRON, RETICCTPCT in the last 72 hours. Sepsis Labs: No results for input(s): PROCALCITON, LATICACIDVEN in the last 168 hours. Recent Results (from the past 240 hour(s))  Resp Panel by RT-PCR (Flu A&B, Covid) Nasopharyngeal Swab     Status: None   Collection Time: 08/18/21  1:18 PM   Specimen: Nasopharyngeal Swab; Nasopharyngeal(NP) swabs in vial transport medium  Result Value Ref Range Status   SARS Coronavirus 2 by RT PCR NEGATIVE NEGATIVE Final    Comment: (NOTE) SARS-CoV-2 target nucleic acids are NOT DETECTED.  The SARS-CoV-2 RNA is generally detectable in upper respiratory specimens during the acute phase of infection. The lowest concentration of SARS-CoV-2 viral copies this assay can detect is 138 copies/mL. A negative result does not preclude SARS-Cov-2 infection and should not be used as the sole basis for treatment or other patient management decisions. A negative result may occur with  improper specimen collection/handling, submission of specimen other than nasopharyngeal swab, presence of viral mutation(s) within the areas targeted by this assay, and inadequate number of viral copies(<138 copies/mL). A negative result must be combined with clinical observations, patient history, and epidemiological information. The  expected result is Negative.  Fact Sheet for Patients:  EntrepreneurPulse.com.au  Fact Sheet for Healthcare Providers:  IncredibleEmployment.be  This test is no t yet approved or cleared by the [place] FDA and  has been authorized for detection and/or diagnosis of SARS-CoV-2 by FDA under an Emergency Use Authorization (EUA). This EUA will remain  in effect (meaning this test can be used) for the duration of the COVID-19 declaration under Section 564(b)(1) of the Act, 21 U.S.C.section 360bbb-3(b)(1), unless the authorization is terminated  or revoked sooner.       Influenza A by  PCR NEGATIVE NEGATIVE Final   Influenza B by PCR NEGATIVE NEGATIVE Final    Comment: (NOTE) The Xpert Xpress SARS-CoV-2/FLU/RSV plus assay is intended as an aid in the diagnosis of influenza from Nasopharyngeal swab specimens and should not be used as a sole basis for treatment. Nasal washings and aspirates are unacceptable for Xpert Xpress SARS-CoV-2/FLU/RSV testing.  Fact Sheet for Patients: EntrepreneurPulse.com.au  Fact Sheet for Healthcare Providers: IncredibleEmployment.be  This test is not yet approved or cleared by the [place] FDA and has been authorized for detection and/or diagnosis of SARS-CoV-2 by FDA under an Emergency Use Authorization (EUA). This EUA will remain in effect (meaning this test can be used) for the duration of the COVID-19 declaration under Section 564(b)(1) of the Act, 21 U.S.C. section 360bbb-3(b)(1), unless the authorization is terminated or revoked.  Performed at [facility], [place] [address], [place], [place] [address]   Urine Culture     Status: Abnormal (Preliminary result)   Collection Time: 08/19/21 12:30 AM   Specimen: Urine, Clean Catch  Result Value Ref Range Status   Specimen Description   Final    URINE, CLEAN CATCH Performed at [facility], [place] [address], [place], [place] [address]    Special Requests   Final    NONE Performed at [facility], [place] [address], [place], [place] [address]    Culture (A)  Final    >=100,000 COLONIES/mL ESCHERICHIA COLI SUSCEPTIBILITIES TO FOLLOW Performed at [facility], [place] [address], [place], [place] [address]    Report Status PENDING  Incomplete   Radiology Studies: DG Chest 1 View  Result Date: 08/19/2021 CLINICAL DATA:  Low O2 sats, crackles EXAM: CHEST  1 VIEW COMPARISON:  08/18/2021 FINDINGS: Cardiomegaly. Aortic calcifications. Left pacer remains in place, unchanged. Left pleural  effusion with left lower lobe atelectasis or infiltrate, similar to prior study. No right effusion or focal opacity. Mild vascular congestion. IMPRESSION: Cardiomegaly, vascular congestion. Small left pleural effusion with left base atelectasis or infiltrate. Electronically Signed   By: Rolm Baptise M.D.   On: 08/19/2021 22:38    Scheduled Meds:  amLODipine  2.5 mg Oral Daily   apixaban  2.5 mg Oral BID   carvedilol  6.25 mg Oral BID   chlorhexidine  15 mL Mouth Rinse BID   feeding supplement  237 mL Oral TID BM   levothyroxine  88 mcg Oral Q0600   mouth rinse  15 mL Mouth Rinse q12n4p   multivitamin with minerals  1 tablet Oral Daily   sertraline  100 mg Oral Daily    LOS: 2 days  Time spent: 42mn  Euclide Granito C Gabreil [place], DO [facility]  If 7PM-7AM, please contact night-coverage [email or web address]  08/21/2021, 7:43 AM

## 2021-08-21 NOTE — Progress Notes (Addendum)
Physical Therapy Treatment Patient Details Name: [name] MRN: [id number] DOB: Oct 11, 1924 Today's Date: 08/21/2021   History of Present Illness 87 y.o. female with medical history significant of allergy, microcytic anemia, anxiety, osteoarthritis, asthma, benign positional vertigo, cataracts, chronic diarrhea, IBS, stage IIIa CKD, diverticulosis, GERD, esophageal stricture, gout, hemorrhoids, hyperlipidemia, hypothyroidism,, diastolic dysfunction, sinus arrest, syncope and collapse, tachybradycardia syndrome with pacemaker placement, LBBB, persistent atrial fibrillation, hypertensive heart disease, peripheral vascular disease who is brought to the emergency department due to generalized weakness associated with severely decreased oral intake and episodes of confusion.    PT Comments    General Comments: appears at prior level during session.  Chart review reports confusion at night.  Pt knew she was in the hospital.  Did not know why. Stated she just moved in with her Son a few weeks ago. Able to express needs and follow directions.  Slightly HOH. General bed mobility comments: increased time and assist B LE's  SATURATION QUALIFICATIONS: (This note is used to comply with regulatory documentation for home oxygen)   Patient Saturations on Room Air at Rest =94%   Patient Saturations on Room Air while Ambulating 35 feet = 85%   Patient Saturations on 1 Liters of oxygen while Ambulating = 90%   Please briefly explain why patient needs home oxygen:  No obvious dyspnea or c/o but RA did decrease to 85% amb 35 feet.  Pt required 1 lt supplemental oxygen during activity.     General transfer comment: assist to power up from chair, cues for hand placement. assist to balance.  Assisted to [facility].  Assisted with hygiene.  RN reports loose stools. General Gait Details: Min Assist to steady and guide walker.  Pt admits to "furniture" walking at home.  Not on any O2 at home.  Poor forward flex  posture.  Slow gait.  Limited activity tolerance.  Delayed correction responce.  HIGH FALL RISK. Pt will need ST Rehab at SNF prior to safely returning home with family.   Recommendations for follow up therapy are one component of a multi-disciplinary discharge planning process, led by the attending physician.  Recommendations may be updated based on patient status, additional functional criteria and insurance authorization.  Follow Up Recommendations  SNF     Equipment Recommendations  None recommended by PT    Recommendations for Other Services       Precautions / Restrictions Precautions Precautions: Fall Precaution Comments: currently on 2L O2, appears does not use O2 at baseline Restrictions Weight Bearing Restrictions: No     Mobility  Bed Mobility Overal bed mobility: Needs Assistance Bed Mobility: Sit to Supine;Supine to Sit     Supine to sit: Min assist Sit to supine: Min assist   General bed mobility comments: increased time and assist B LE's    Transfers Overall transfer level: Needs assistance Equipment used: Rolling walker (2 wheeled);None Transfers: Sit to/from [employer] to Stand: Min assist Stand pivot transfers: Min assist;Mod assist       General transfer comment: assist to power up from chair, cues for hand placement. assist to balance.  Assisted to [facility].  Assisted with hygiene.  RN reports loose stools.  Ambulation/Gait Ambulation/Gait assistance: Min assist Gait Distance (Feet): 35 Feet Assistive device: Rolling walker (2 wheeled) Gait Pattern/deviations: Step-to pattern;Trunk flexed;Drifts right/left Gait velocity: decreased   General Gait Details: Min Assist to steady and guide walker.  Pt admits to "furniture" walking at home.  Not on any O2 at home.  Poor forward flex posture.  Slow gait.  Limited activity tolerance.  Delayed correction responce.  HIGH FALL RISK.   Stairs             Wheelchair Mobility     Modified Rankin (Stroke Patients Only)       Balance                                            Cognition Arousal/Alertness: Awake/alert Behavior During Therapy: WFL for tasks assessed/performed Overall Cognitive Status: Within Functional Limits for tasks assessed                                 General Comments: appears at prior level during session.  Chart review reports confusion at night.  Pt knew she was in the hospital.  Did not know why. Stated she just moved in with her Son a few weeks ago. Able to express needs and follow directions.  Slightly HOH.      Exercises      General Comments        Pertinent Vitals/Pain Pain Assessment: No/denies pain    Home Living                      Prior Function            PT Goals (current goals can now be found in the care plan section) Progress towards PT goals: Progressing toward goals    Frequency    Min 2X/week      PT Plan Current plan remains appropriate    Co-evaluation              AM-PAC PT "6 Clicks" Mobility   Outcome Measure  Help needed turning from your back to your side while in a flat bed without using bedrails?: A Little Help needed moving from lying on your back to sitting on the side of a flat bed without using bedrails?: A Little Help needed moving to and from a bed to a chair (including a wheelchair)?: A Little Help needed standing up from a chair using your arms (e.g., wheelchair or bedside chair)?: A Lot Help needed to walk in hospital room?: A Lot Help needed climbing 3-5 steps with a railing? : Total 6 Click Score: 14    End of Session Equipment Utilized During Treatment: Gait belt Activity Tolerance: Patient limited by fatigue Patient left: in bed;with call bell/phone within reach;with bed alarm set Nurse Communication: Mobility status PT Visit Diagnosis: Other abnormalities of gait and mobility (R26.89)     Time: [id number] PT  Time Calculation (min) (ACUTE ONLY): 27 min  Charges:  $Gait Training: 8-22 mins $Therapeutic Activity: 8-22 mins                     Rica Koyanagi  PTA Acute  Rehabilitation Services Pager      [phone] Office      [phone]

## 2021-08-21 NOTE — Evaluation (Signed)
Occupational Therapy Evaluation Patient Details Name: [name] MRN: [id number] DOB: 20-Aug-1924 Today's Date: 08/21/2021   History of Present Illness 87 y.o. female with medical history significant of allergy, microcytic anemia, anxiety, osteoarthritis, asthma, benign positional vertigo, cataracts, chronic diarrhea, IBS, stage IIIa CKD, diverticulosis, GERD, esophageal stricture, gout, hemorrhoids, hyperlipidemia, hypothyroidism,, diastolic dysfunction, sinus arrest, syncope and collapse, tachybradycardia syndrome with pacemaker placement, LBBB, persistent atrial fibrillation, hypertensive heart disease, peripheral vascular disease who is brought to the emergency department due to generalized weakness associated with severely decreased oral intake and episodes of confusion.   Clinical Impression   Patient is a 87 year old female who is pleasantly confused. Patient was noted to have decreased functional activity tolerance, decreased standing balance, decreased safety awareness, and decreased endurance impacting patients participation in ADLs. Patient would continue to benefit from skilled OT services at this time while admitted and after d/c to address noted deficits in order to improve overall safety and independence in ADLs.       Recommendations for follow up therapy are one component of a multi-disciplinary discharge planning process, led by the attending physician.  Recommendations may be updated based on patient status, additional functional criteria and insurance authorization.   Follow Up Recommendations  SNF    Equipment Recommendations  Other (comment) (defer to next venue)    Recommendations for Other Services       Precautions / Restrictions Precautions Precautions: Fall Precaution Comments: currently on 2L O2, appears does not use O2 at baseline Restrictions Weight Bearing Restrictions: No      Mobility Bed Mobility Overal bed mobility: Needs Assistance Bed  Mobility: Sit to Supine;Supine to Sit     Supine to sit: Min assist Sit to supine: Min assist   General bed mobility comments: increased time and assist B LE's    Transfers Overall transfer level: Needs assistance Equipment used: Rolling walker (2 wheeled) Transfers: Sit to/from [facility] Sit to Stand: Min assist Stand pivot transfers: Min assist       General transfer comment: patient needed continued education on proper hand placement for transfers with multimodal cues to follow education during task    Balance Overall balance assessment: Mild deficits observed, not formally tested                                         ADL either performed or assessed with clinical judgement   ADL Overall ADL's : Needs assistance/impaired Eating/Feeding: Set up;Sitting   Grooming: Oral care;Wash/dry face;Standing;Min guard Grooming Details (indicate cue type and reason): at sink Upper Body Bathing: Min guard;Sitting   Lower Body Bathing: Minimal assistance;Sit to/from stand   Upper Body Dressing : Min guard;Sitting   Lower Body Dressing: Minimal assistance;Sit to/from stand Lower Body Dressing Details (indicate cue type and reason): patient was able to don/doff socks sititng on edge of bed with increased time. Toilet Transfer: Min guard;Minimal assistance;RW;Ambulation Armed forces technical officer Details (indicate cue type and reason): with cues for safety and sequencing of task Toileting- Clothing Manipulation and Hygiene: Minimal assistance;Sit to/from stand       Functional mobility during ADLs: Min guard;Rolling walker General ADL Comments: patient needed max A to manage O2 cord for mobility with continued education on proper placement to avoid being wrapped up in wire.     Vision Ability to See in Adequate Light: 2 Moderately impaired Patient Visual Report: No change from  baseline Additional Comments: patient had a hard time following commands for  vision assessment     Perception     Praxis      Pertinent Vitals/Pain Pain Assessment: No/denies pain     Hand Dominance Right   Extremity/Trunk Assessment Upper Extremity Assessment Upper Extremity Assessment: Generalized weakness   Lower Extremity Assessment Lower Extremity Assessment: Defer to PT evaluation   Cervical / Trunk Assessment Cervical / Trunk Assessment: Kyphotic   Communication Communication Communication: No difficulties   Cognition Arousal/Alertness: Awake/alert Behavior During Therapy: WFL for tasks assessed/performed Overall Cognitive Status: Within Functional Limits for tasks assessed Area of Impairment: Orientation;Following commands                 Orientation Level: Disoriented to;Place;Time;Situation     Following Commands: Follows one step commands with increased time       General Comments: patient did not know she was at [place] today. patient was able to express needs slightly [facility]   General Comments       Exercises     Shoulder Instructions      Home Living Family/patient expects to be discharged to:: Private residence Living Arrangements: Children Available Help at Discharge: Available 24 hours/day;Family   Home Access: Level entry     Home Layout: One level     Bathroom Shower/Tub: Teacher, early years/pre: Standard     Home Equipment: Environmental consultant - 2 wheels;Cane - single point   Additional Comments: patient is unreliable historian with no family in room at this time. patient reported having recently moved in with family from home.      Prior Functioning/Environment Level of Independence: Independent        Comments: patient reported being independent but unclear on PLOF        OT Problem List:        OT Treatment/Interventions: Self-care/ADL training;DME and/or AE instruction;Energy conservation;Therapeutic activities;Balance training;Patient/family education    OT Goals(Current goals can be  found in the care plan section) Acute Rehab OT Goals Patient Stated Goal: none stated OT Goal Formulation: With patient Time For Goal Achievement: 09/04/21 Potential to Achieve Goals: Good  OT Frequency: Min 2X/week   Barriers to D/C:    patient reported living at home alone       Co-evaluation              AM-PAC OT "6 Clicks" Daily Activity     Outcome Measure Help from another person eating meals?: A Little Help from another person taking care of personal grooming?: A Little Help from another person toileting, which includes using toliet, bedpan, or urinal?: A Little Help from another person bathing (including washing, rinsing, drying)?: A Lot Help from another person to put on and taking off regular upper body clothing?: A Little Help from another person to put on and taking off regular lower body clothing?: A Little 6 Click Score: 17   End of Session Equipment Utilized During Treatment: Gait belt;Rolling walker Nurse Communication: Other (comment) (nurse ok patient to participate)  Activity Tolerance: Patient tolerated treatment well Patient left: in bed;with call bell/phone within reach;with bed alarm set  OT Visit Diagnosis: Unsteadiness on feet (R26.81);Muscle weakness (generalized) (M62.81)                Time: 1326-1350 OT Time Calculation (min): 24 min Charges:  OT General Charges $OT Visit: 1 Visit OT Evaluation $OT Eval Low Complexity: 1 Low OT Treatments $Self Care/Home Management : 8-22 mins  Jackelyn Poling OTR/L, [place] [facility] Department Office# [phone] Pager# [phone]   [place] 08/21/2021, 2:22 PM

## 2021-08-22 DIAGNOSIS — E785 Hyperlipidemia, unspecified: Secondary | ICD-10-CM

## 2021-08-22 DIAGNOSIS — F418 Other specified anxiety disorders: Secondary | ICD-10-CM

## 2021-08-22 DIAGNOSIS — R778 Other specified abnormalities of plasma proteins: Secondary | ICD-10-CM

## 2021-08-22 DIAGNOSIS — E038 Other specified hypothyroidism: Secondary | ICD-10-CM

## 2021-08-22 DIAGNOSIS — I4819 Other persistent atrial fibrillation: Secondary | ICD-10-CM

## 2021-08-22 DIAGNOSIS — R531 Weakness: Secondary | ICD-10-CM

## 2021-08-22 DIAGNOSIS — E43 Unspecified severe protein-calorie malnutrition: Secondary | ICD-10-CM

## 2021-08-22 DIAGNOSIS — I700 Atherosclerosis of aorta: Secondary | ICD-10-CM

## 2021-08-22 LAB — SARS CORONAVIRUS 2 (TAT 6-24 HRS): SARS Coronavirus 2: NEGATIVE

## 2021-08-22 LAB — BASIC METABOLIC PANEL
Anion gap: 9 (ref 5–15)
BUN: 45 mg/dL — ABNORMAL HIGH (ref 8–23)
CO2: 29 mmol/L (ref 22–32)
Calcium: 8.4 mg/dL — ABNORMAL LOW (ref 8.9–10.3)
Chloride: 97 mmol/L — ABNORMAL LOW (ref 98–111)
Creatinine, Ser: 1.35 mg/dL — ABNORMAL HIGH (ref 0.44–1.00)
GFR, Estimated: 36 mL/min — ABNORMAL LOW (ref >60)
Glucose, Bld: 118 mg/dL — ABNORMAL HIGH (ref 70–99)
Potassium: 4 mmol/L (ref 3.5–5.1)
Sodium: 135 mmol/L (ref 135–145)

## 2021-08-22 LAB — CBC
HCT: 39.8 % (ref 36.0–46.0)
Hemoglobin: 12.4 g/dL (ref 12.0–15.0)
MCH: 31.5 pg (ref 26.0–34.0)
MCHC: 31.2 g/dL (ref 30.0–36.0)
MCV: 101 fL — ABNORMAL HIGH (ref 80.0–100.0)
Platelets: 139 10*3/uL — ABNORMAL LOW (ref 150–400)
RBC: 3.94 MIL/uL (ref 3.87–5.11)
RDW: 15.2 % (ref 11.5–15.5)
WBC: 4.6 10*3/uL (ref 4.0–10.5)
nRBC: 0 % (ref 0.0–0.2)

## 2021-08-22 LAB — GLUCOSE, CAPILLARY: Glucose-Capillary: 109 mg/dL — ABNORMAL HIGH (ref 70–99)

## 2021-08-22 MED ORDER — TEMAZEPAM 15 MG PO CAPS: 15.0000 mg | ORAL_CAPSULE | Freq: Every evening | ORAL | 0 refills | Status: DC | PRN

## 2021-08-22 MED ORDER — LORAZEPAM 0.5 MG PO TABS: 0.5000 mg | ORAL_TABLET | Freq: Four times a day (QID) | ORAL | 2 refills | Status: DC | PRN

## 2021-08-22 MED ORDER — ENSURE ENLIVE PO LIQD: 237.0000 mL | Freq: Three times a day (TID) | ORAL | Status: DC

## 2021-08-22 NOTE — Progress Notes (Signed)
Patient noted to be extremely drowsy and not following writer commands signs stable. Dr. Louanne Belton notified and  discharge  canceled for today. Will continue to  monitor the patient.

## 2021-08-22 NOTE — Plan of Care (Signed)

## 2021-08-22 NOTE — Care Management Important Message (Signed)
Important Message  Patient Details IM Letter placed in the Patient's room. Name: [name] MRN: [id number] Date of Birth: [birth year]   Medicare Important Message Given:  Yes     Kerin Salen 08/22/2021, 11:31 AM

## 2021-08-22 NOTE — Discharge Summary (Addendum)
Physician Discharge Summary  [name] [id number] DOB: [birth year] DOA: 08/18/2021  PCP: [name], MD  Admit date: 08/18/2021 Discharge date: 08/23/2021  Admitted From: Skilled nursing facility.  Discharge disposition: Skilled nursing facility  Recommendations for Outpatient Follow-Up:   Follow up with your primary care provider in one week.  Check CBC, BMP, magnesium in the next visit Wean oxygen as able.  Encourage incentive spirometry.  Discharge Diagnosis:   Principal Problem:   Acute kidney injury superimposed on CKD (HCC) Active Problems:   Hypothyroidism   Hyperlipidemia   Persistent Paroxysmal atrial fibrillation: CHA2DS2-VASc Score 5, on Eliquis   Depression with anxiety   Elevated troponin   Aortic atherosclerosis (HCC)   Generalized weakness   AKI (acute kidney injury) ([place])   Protein-calorie malnutrition, severe  Discharge Condition: Improved.  Diet recommendation:   Regular.  Wound care: None.  Code status: Full.  History of Present Illness:   [name] is a 87 y.o. female with medical history significant for microcytic anemia, anxiety, osteoarthritis, asthma, benign positional vertigo, cataracts, chronic diarrhea, IBS, stage IIIa CKD, diverticulosis, GERD, esophageal stricture, gout, hemorrhoids, hyperlipidemia, hypothyroidism,, diastolic dysfunction, sinus arrest, syncope and collapse, tachybradycardia syndrome with pacemaker placement, LBBB, persistent atrial fibrillation, hypertensive heart disease, peripheral vascular disease was brought into the hospital with generalized weakness, decreased oral intake and episodes of confusion.  She also had failure to thrive.   Hospital Course:   Following conditions were addressed during hospitalization as listed below,  AKI superimposed on CKD IV, resolving Creatinine of 1.3.  Improved.     Acute hypoxic respiratory failure, Chest x-ray shows left basilar atelectasis.  On supplemental  oxygen.  Wean off as able.  Encourage incentive spirometry  Elevated troponin Likely demand ischemia secondary to hypotension hypoxia and volume depletion.  No chest pain.  Stable at this time.   Hypothyroidism Continue  Synthroid   Hyperlipidemia/Aortic atherosclerosis Due to advanced age patient is not on statins.   Persistent Paroxysmal atrial fibrillation - CHA2DS2-VASc Score 5, continue Eliquis and Coreg.  Stable at this time.   Chronic obstructive pulmonary disease Continue bronchodilators.  No wheezing.   Depression with anxiety Continue sertraline.  On Ativan and temazepam at home.   Severe protein calorie malnutrition.  Failure to thrive.  Present on admission.  We will continue nutritional supplements.   Disposition.  At this time, patient is stable for disposition back to skilled nursing facility.  Spoke with the patient's son at bedside.  Medical Consultants:   None.  Procedures:    None Subjective:   Today, patient was seen and examined at bedside.  Denies interval complaints.  Appeared to be sleepy.  Discharge Exam:   Vitals:   08/22/21 0556 08/22/21 0844  BP: (!) 149/59 (!) 150/64  Pulse: 70 62  Resp: 15 16  Temp: 97.8 F (36.6 C)   SpO2: 96% 100%   Vitals:   08/21/21 2018 08/22/21 0440 08/22/21 0556 08/22/21 0844  BP: (!) 153/59 (!) 133/55 (!) 149/59 (!) 150/64  Pulse: 62 65 70 62  Resp: '18 18 15 16  '$ Temp: 98 F (36.7 C) (!) 97.5 F (36.4 C) 97.8 F (36.6 C)   TempSrc: Oral Oral Oral   SpO2: 97% 91% 96% 100%  Weight:      Height:       General: Cachectic, elderly female, not in obvious distress, mildly somnolent HENT:   No scleral pallor or icterus noted. Oral mucosa is moist.  Chest:  Clear  breath sounds.  Diminished breath sounds bilaterally. No crackles or wheezes.  CVS: S1 &S2 heard. No murmur.  Irregularly irregular rhythm. Abdomen: Soft, nontender, nondistended.  Bowel sounds are heard.   Extremities: No cyanosis, clubbing or  edema.  Peripheral pulses are palpable. Psych: mildly somnolent  CNS:  No cranial nerve deficits.  Moves all extremities. Skin: Warm and dry.  No rashes noted.  The results of significant diagnostics from this hospitalization (including imaging, microbiology, ancillary and laboratory) are listed below for reference.     Diagnostic Studies:   DG Chest 1 View  Result Date: 08/19/2021 CLINICAL DATA:  Low O2 sats, crackles EXAM: CHEST  1 VIEW COMPARISON:  08/18/2021 FINDINGS: Cardiomegaly. Aortic calcifications. Left pacer remains in place, unchanged. Left pleural effusion with left lower lobe atelectasis or infiltrate, similar to prior study. No right effusion or focal opacity. Mild vascular congestion. IMPRESSION: Cardiomegaly, vascular congestion. Small left pleural effusion with left base atelectasis or infiltrate. Electronically Signed   By: Rolm Baptise M.D.   On: 08/19/2021 22:38   CT HEAD WO CONTRAST (5MM)  Result Date: 08/18/2021 CLINICAL DATA:  Mental status change, failure to thrive for 1 week EXAM: CT HEAD WITHOUT CONTRAST TECHNIQUE: Contiguous axial images were obtained from the base of the skull through the vertex without intravenous contrast. COMPARISON:  05/04/2020 FINDINGS: Brain: No evidence of acute infarction, hemorrhage, extra-axial collection, ventriculomegaly, or mass effect. Generalized cerebral atrophy. Periventricular white matter low attenuation likely secondary to microangiopathy. Vascular: Cerebrovascular atherosclerotic calcifications are noted. Skull: Negative for fracture or focal lesion. Sinuses/Orbits: Visualized portions of the orbits are unremarkable. Visualized portions of the paranasal sinuses are unremarkable. Visualized portions of the mastoid air cells are unremarkable. Other: None. IMPRESSION: 1. No acute intracranial pathology. 2. Chronic microvascular disease and cerebral atrophy. Electronically Signed   By: Kathreen Devoid M.D.   On: 08/18/2021 14:08   DG Chest  Portable 1 View  Result Date: 08/18/2021 CLINICAL DATA:  Shortness of breath, hypoxemia EXAM: PORTABLE CHEST 1 VIEW COMPARISON:  07/20/2021 FINDINGS: Small left pleural effusion. No right pleural effusion. No pneumothorax. Mild bilateral interstitial thickening. Stable cardiomegaly. Dual lead cardiac pacemaker. No acute osseous abnormality. IMPRESSION: Cardiomegaly with mild pulmonary vascular congestion. Electronically Signed   By: Kathreen Devoid M.D.   On: 08/18/2021 14:06     Labs:   Basic Metabolic Panel: Recent Labs  Lab 08/18/21 1318 08/19/21 0347 08/20/21 0854 08/21/21 0333 08/22/21 0333  NA 137 135 135 133* 135  K 4.1 3.7 3.7 3.9 4.0  CL 95* 97* 99 94* 97*  CO2 '30 27 30 31 29  '$ GLUCOSE 104* 101* 102* 93 118*  BUN 42* 41* 41* 42* 45*  CREATININE 2.04* 2.06* 1.85* 1.73* 1.35*  CALCIUM 8.9 8.1* 8.1* 8.1* 8.4*   GFR Estimated Creatinine Clearance: 20.1 mL/min (A) (by C-G formula based on SCr of 1.35 mg/dL (H)). Liver Function Tests: Recent Labs  Lab 08/18/21 1318  AST 69*  ALT 43  ALKPHOS 54  BILITOT 1.0  PROT 7.6  ALBUMIN 4.5   Recent Labs  Lab 08/18/21 1318  LIPASE 42   No results for input(s): AMMONIA in the last 168 hours. Coagulation profile No results for input(s): INR, PROTIME in the last 168 hours.  CBC: Recent Labs  Lab 08/18/21 1318 08/19/21 0615 08/20/21 0854 08/21/21 0333 08/22/21 0333  WBC 4.4 3.0* 3.5* 3.3* 4.6  NEUTROABS 3.2  --   --   --   --   HGB 14.3 12.0 13.0 13.1  12.4  HCT 46.0 38.1 40.6 41.4 39.8  MCV 101.3* 98.4 98.3 99.3 101.0*  PLT 160 124* 133* 123* 139*   Cardiac Enzymes: No results for input(s): CKTOTAL, CKMB, CKMBINDEX, TROPONINI in the last 168 hours. BNP: Invalid input(s): POCBNP CBG: Recent Labs  Lab 08/22/21 0555  GLUCAP 109*   D-Dimer No results for input(s): DDIMER in the last 72 hours. Hgb A1c No results for input(s): HGBA1C in the last 72 hours. Lipid Profile No results for input(s): CHOL, HDL, LDLCALC,  TRIG, CHOLHDL, LDLDIRECT in the last 72 hours. Thyroid function studies No results for input(s): TSH, T4TOTAL, T3FREE, THYROIDAB in the last 72 hours.  Invalid input(s): FREET3 Anemia work up No results for input(s): VITAMINB12, FOLATE, FERRITIN, TIBC, IRON, RETICCTPCT in the last 72 hours. Microbiology Recent Results (from the past 240 hour(s))  Resp Panel by RT-PCR (Flu A&B, Covid) Nasopharyngeal Swab     Status: None   Collection Time: 08/18/21  1:18 PM   Specimen: Nasopharyngeal Swab; Nasopharyngeal(NP) swabs in vial transport medium  Result Value Ref Range Status   SARS Coronavirus 2 by RT PCR NEGATIVE NEGATIVE Final    Comment: (NOTE) SARS-CoV-2 target nucleic acids are NOT DETECTED.  The SARS-CoV-2 RNA is generally detectable in upper respiratory specimens during the acute phase of infection. The lowest concentration of SARS-CoV-2 viral copies this assay can detect is 138 copies/mL. A negative result does not preclude SARS-Cov-2 infection and should not be used as the sole basis for treatment or other patient management decisions. A negative result may occur with  improper specimen collection/handling, submission of specimen other than nasopharyngeal swab, presence of viral mutation(s) within the areas targeted by this assay, and inadequate number of viral copies(<138 copies/mL). A negative result must be combined with clinical observations, patient history, and epidemiological information. The expected result is Negative.  Fact Sheet for Patients:  EntrepreneurPulse.com.au  Fact Sheet for Healthcare Providers:  IncredibleEmployment.be  This test is no t yet approved or cleared by the [place] FDA and  has been authorized for detection and/or diagnosis of SARS-CoV-2 by FDA under an Emergency Use Authorization (EUA). This EUA will remain  in effect (meaning this test can be used) for the duration of the COVID-19 declaration under  Section 564(b)(1) of the Act, 21 U.S.C.section 360bbb-3(b)(1), unless the authorization is terminated  or revoked sooner.       Influenza A by PCR NEGATIVE NEGATIVE Final   Influenza B by PCR NEGATIVE NEGATIVE Final    Comment: (NOTE) The Xpert Xpress SARS-CoV-2/FLU/RSV plus assay is intended as an aid in the diagnosis of influenza from Nasopharyngeal swab specimens and should not be used as a sole basis for treatment. Nasal washings and aspirates are unacceptable for Xpert Xpress SARS-CoV-2/FLU/RSV testing.  Fact Sheet for Patients: EntrepreneurPulse.com.au  Fact Sheet for Healthcare Providers: IncredibleEmployment.be  This test is not yet approved or cleared by the [place] FDA and has been authorized for detection and/or diagnosis of SARS-CoV-2 by FDA under an Emergency Use Authorization (EUA). This EUA will remain in effect (meaning this test can be used) for the duration of the COVID-19 declaration under Section 564(b)(1) of the Act, 21 U.S.C. section 360bbb-3(b)(1), unless the authorization is terminated or revoked.  Performed at [facility], [place] [address] [place], [place] [address]   Urine Culture     Status: Abnormal   Collection Time: 08/19/21 12:30 AM   Specimen: Urine, Clean Catch  Result Value Ref Range Status   Specimen Description  Final    URINE, CLEAN CATCH Performed at [facility], [place] [address], [place], [place] [address]    Special Requests   Final    NONE Performed at [facility], [place] [address] [place], [place] [address]    Culture >=100,000 COLONIES/mL ESCHERICHIA COLI (A)  Final   Report Status 08/21/2021 FINAL  Final   Organism ID, Bacteria ESCHERICHIA COLI (A)  Final      Susceptibility   Escherichia coli - MIC*    AMPICILLIN <=2 SENSITIVE Sensitive     CEFAZOLIN <=4 SENSITIVE Sensitive     CEFEPIME <=0.12 SENSITIVE Sensitive      CEFTRIAXONE <=0.25 SENSITIVE Sensitive     CIPROFLOXACIN <=0.25 SENSITIVE Sensitive     GENTAMICIN <=1 SENSITIVE Sensitive     IMIPENEM <=0.25 SENSITIVE Sensitive     NITROFURANTOIN <=16 SENSITIVE Sensitive     TRIMETH/SULFA <=20 SENSITIVE Sensitive     AMPICILLIN/SULBACTAM <=2 SENSITIVE Sensitive     PIP/TAZO <=4 SENSITIVE Sensitive     * >=100,000 COLONIES/mL ESCHERICHIA COLI  SARS CORONAVIRUS 2 (TAT 6-24 HRS) Nasopharyngeal Nasopharyngeal Swab     Status: None   Collection Time: 08/21/21  7:00 PM   Specimen: Nasopharyngeal Swab  Result Value Ref Range Status   SARS Coronavirus 2 NEGATIVE NEGATIVE Final    Comment: (NOTE) SARS-CoV-2 target nucleic acids are NOT DETECTED.  The SARS-CoV-2 RNA is generally detectable in upper and lower respiratory specimens during the acute phase of infection. Negative results do not preclude SARS-CoV-2 infection, do not rule out co-infections with other pathogens, and should not be used as the sole basis for treatment or other patient management decisions. Negative results must be combined with clinical observations, patient history, and epidemiological information. The expected result is Negative.  Fact Sheet for Patients: SugarRoll.be  Fact Sheet for Healthcare Providers: https://www.woods-mathews.com/  This test is not yet approved or cleared by the [place] FDA and  has been authorized for detection and/or diagnosis of SARS-CoV-2 by FDA under an Emergency Use Authorization (EUA). This EUA will remain  in effect (meaning this test can be used) for the duration of the COVID-19 declaration under Se ction 564(b)(1) of the Act, 21 U.S.C. section 360bbb-3(b)(1), unless the authorization is terminated or revoked sooner.  Performed at [facility], [place] [address], [place], [place] [address]      Discharge Instructions:   Discharge Instructions     Diet general   Complete by: As  directed    Discharge instructions   Complete by: As directed    Follow-up with your primary care provider at the skilled nursing facility in 3 to 5 days.  Check blood work at that time.  Increase oral intake.   Increase activity slowly   Complete by: As directed       Allergies as of 08/22/2021       Reactions   Penicillins Swelling   REACTION: nausea, swelling   Xifaxan [rifaximin] Other (See Comments), Rash   unknown   Ambien [zolpidem Tartrate]    " made her crazy "   Amoxicillin    REACTION: unspecified   Ceftin [cefuroxime Axetil] Diarrhea   Codeine Phosphate    REACTION: unspecified   Colchicine    Severe diarrhea   Levofloxacin Other (See Comments)   Tingle numbness   Lipitor [atorvastatin] Other (See Comments)   "makes legs jump all night"        Medication List     TAKE  these medications    acetaminophen 325 MG tablet Commonly known as: TYLENOL Take 2 tablets (650 mg total) by mouth every 6 (six) hours as needed for mild pain (or Fever >/= 101).   amLODipine 2.5 MG tablet Commonly known as: NORVASC TAKE 1 TABLET BY MOUTH EVERY DAY   apixaban 2.5 MG Tabs tablet Commonly known as: Eliquis Take 1 tablet (2.5 mg total) by mouth 2 (two) times daily.   carvedilol 6.25 MG tablet Commonly known as: COREG Take 1 tablet (6.25 mg total) by mouth 2 (two) times daily.   feeding supplement Liqd Take 237 mLs by mouth 3 (three) times daily between meals.   hydrALAZINE 100 MG tablet Commonly known as: APRESOLINE Take 1.5 tablets (150 mg total) by mouth 2 (two) times daily.   levothyroxine 88 MCG tablet Commonly known as: SYNTHROID TAKE 1 TABLET DAILY BEFORE BREAKFAST What changed:  how much to take how to take this when to take this   LORazepam 0.5 MG tablet Commonly known as: ATIVAN Take 1 tablet (0.5 mg total) by mouth every 6 (six) hours as needed for anxiety.   nitroGLYCERIN 0.4 MG SL tablet Commonly known as: NITROSTAT Place 1 tablet (0.4 mg  total) under the tongue every 5 (five) minutes x 3 doses as needed for chest pain.   sertraline 100 MG tablet Commonly known as: ZOLOFT Take 1 tablet (100 mg total) by mouth daily.   temazepam 15 MG capsule Commonly known as: RESTORIL Take 1 capsule (15 mg total) by mouth at bedtime as needed for sleep. TAKE 2 CAPSULE BY MOUTH EVERY NIGHT AT BEDTIME AS NEEDED FOR SLEEP What changed:  how much to take how to take this when to take this reasons to take this          Time coordinating discharge: 39 minutes  Signed:  Thressa Shiffer  [facility] 08/22/2021, 11:51 AM

## 2021-08-22 NOTE — TOC Progression Note (Addendum)
Transition of Care [facility]) - Progression Note    Patient Details  Name: [name] MRN: [id number] Date of Birth: Sep 19, 1924  Transition of Care [facility]) CM/SW Contact  Leeroy Cha, RN Phone Number: 08/22/2021, 11:58 AM  Clinical Narrative:    [facility] admissions for patient to be transferred there has been dcd message left to call back. Tct-Son [name]- made aware of dc for his mother he has signed papers at [employer]. Tcf-Shannon Pearline Cables- patient will go to room [place].  Phone number for report is [phone].  Packet for dc given to ron. Tct-Shannon Gray-message left on voice mail that pt is unable to come today but will be dcd in the am. Expected Discharge Plan: [place] Barriers to Discharge: Continued Medical Work up  Expected Discharge Plan and Services Expected Discharge Plan: [place] arrangements for the past 2 months: Single Family Home Expected Discharge Date: 08/22/21                                     Social Determinants of Health (SDOH) Interventions    Readmission Risk Interventions Readmission Risk Prevention Plan 08/22/2021  Transportation Screening Complete  PCP or Specialist Appt within 5-7 Days Complete  Home Care Screening Complete  Medication Review (RN CM) Complete  Some recent data might be hidden

## 2021-08-22 NOTE — Progress Notes (Signed)
Called d/t pt level of consciousness.  Upon assessment of pt she is able to follow commands.  Pt breath sounds decreased pt placed on 3 L ( See flow sheet)  Pt becomes more alert VSS.  See notes.

## 2021-08-22 NOTE — Progress Notes (Signed)
Notified Charge Nurse about patient not responding. Patient would open eyes, but not communicate nor follow commands. Nurse was also concerned about the patient's eye, since the right eye looked like a cat eye and was not responding to light, while the left eye was responding to light. Charge Nurse came to the bedside. Charge Nurse and nurse took another set of vitals, as well as checked the patient's blood sugar. Both patient's vitals and blood sugar were okay. Charge Nurse and nurse bumped patient's oxygen up to 3L/min. from the 2L/min. Nurse notified rapid response, as well as the on call provider. Rapid response came to the bedside to evaluate patient. Patient was starting to become more alert and respond to voice, pain, and touch. Patient even tried to speak. Nurse had given the patient a one time dose of 30 mg oral Restoril, which was ordered by the on call provider since patient wanted something to help her sleep. Rapid response nurse mentioned that patient was probably acting like that due to the Restoril. Gave report to the oncoming nurse about the patient. Oncoming nurse mentioned that patient sleeps late in the morning per one of patient's family members. Patient is stable, just fatigued and very drowsy.

## 2021-08-22 NOTE — Progress Notes (Addendum)
PROGRESS NOTE    [name]  [id number] DOB: 04/27/1924 DOA: 08/18/2021 PCP: [name], MD   Brief Narrative:  [name] is a 87 y.o. female with medical history significant for microcytic anemia, anxiety, osteoarthritis, asthma, benign positional vertigo, cataracts, chronic diarrhea, IBS, stage IIIa CKD, diverticulosis, GERD, esophageal stricture, gout, hemorrhoids, hyperlipidemia, hypothyroidism,, diastolic dysfunction, sinus arrest, syncope and collapse, tachybradycardia syndrome with pacemaker placement, LBBB, persistent atrial fibrillation, hypertensive heart disease, peripheral vascular disease was brought into the hospital with generalized weakness, decreased oral intake and episodes of confusion.  She also had failure to thrive.   Assessment & Plan:   AKI superimposed on CKD IV, resolving Creatinine of 1.3.  Improved.  Continue to monitor.  Acute hypoxic respiratory failure, likely multifactorial, POA Chest x-ray shows left basilar atelectasis.  Supplemental oxygen.  We will continue to wean as able.  Elevated troponin Likely demand ischemia secondary to hypotension hypoxia and volume depletion.  No chest pain.     Hypothyroidism Continue  Synthroid   Hyperlipidemia/Aortic atherosclerosis Due to advanced age patient is not on statins.   Persistent Paroxysmal atrial fibrillation - CHA2DS2-VASc Score 5, continue Eliquis and Coreg.   Chronic obstructive pulmonary disease Continue bronchodilators.   Depression with anxiety Continue sertraline.  Hold benzodiazepines.   Severe protein calorie malnutrition.  Failure to thrive.  Present on admission.  We will continue nutritional supplements.   DVT prophylaxis: On apixaban.  Code Status:  DNR.  Family Communication: None today.  Status is: Inpatient  Dispo: The patient is from: Home              Anticipated d/c is to: SNF as per PT and family              Patient currently not medically stable for  discharge  Consultants:  None  Procedures:  None  Antimicrobials:  None  Subjective: Today, patient was seen and examined at bedside.  Seen to be mostly sleepy and did not verbalize much.  No report of nausea, vomiting fever or chills.  Objective: Vitals:   08/21/21 2018 08/22/21 0440 08/22/21 0556 08/22/21 0844  BP: (!) 153/59 (!) 133/55 (!) 149/59 (!) 150/64  Pulse: 62 65 70 62  Resp: '18 18 15 16  '$ Temp: 98 F (36.7 C) (!) 97.5 F (36.4 C) 97.8 F (36.6 C)   TempSrc: Oral Oral Oral   SpO2: 97% 91% 96% 100%  Weight:      Height:        Intake/Output Summary (Last 24 hours) at 08/22/2021 1114 Last data filed at 08/22/2021 1000 Gross per 24 hour  Intake 240 ml  Output 150 ml  Net 90 ml    Filed Weights   08/19/21 [id number]  Weight: 49.9 kg    Physical examination: General: Cachectic, elderly female, not in obvious distress, mildly somnolent HENT:   No scleral pallor or icterus noted. Oral mucosa is moist.  Chest:  Clear breath sounds.  Diminished breath sounds bilaterally. No crackles or wheezes.  CVS: S1 &S2 heard. No murmur.  Irregularly irregular rhythm. Abdomen: Soft, nontender, nondistended.  Bowel sounds are heard.   Extremities: No cyanosis, clubbing or edema.  Peripheral pulses are palpable. Psych: mildly somnolent  CNS:  No cranial nerve deficits.  Moves all extremities. Skin: Warm and dry.  No rashes noted.   Data Reviewed: I have personally reviewed the following labs and imaging studies   CBC: Recent Labs  Lab 08/18/21 1318 08/19/21 0615 08/20/21 [id number]  08/21/21 0333 08/22/21 0333  WBC 4.4 3.0* 3.5* 3.3* 4.6  NEUTROABS 3.2  --   --   --   --   HGB 14.3 12.0 13.0 13.1 12.4  HCT 46.0 38.1 40.6 41.4 39.8  MCV 101.3* 98.4 98.3 99.3 101.0*  PLT 160 124* 133* 123* 139*    Basic Metabolic Panel: Recent Labs  Lab 08/18/21 1318 08/19/21 0347 08/20/21 0854 08/21/21 0333 08/22/21 0333  NA 137 135 135 133* 135  K 4.1 3.7 3.7 3.9 4.0  CL 95*  97* 99 94* 97*  CO2 '30 27 30 31 29  '$ GLUCOSE 104* 101* 102* 93 118*  BUN 42* 41* 41* 42* 45*  CREATININE 2.04* 2.06* 1.85* 1.73* 1.35*  CALCIUM 8.9 8.1* 8.1* 8.1* 8.4*   GFR: Estimated Creatinine Clearance: 20.1 mL/min (A) (by C-G formula based on SCr of 1.35 mg/dL (H)).  Liver Function Tests: Recent Labs Lab 08/18/21 1318 AST 69* ALT 43 ALKPHOS 54 BILITOT 1.0 PROT 7.6 ALBUMIN 4.5   Recent Labs  Lab 08/18/21 1318  LIPASE 42   No results for input(s): AMMONIA in the last 168 hours. Coagulation Profile: No results for input(s): INR, PROTIME in the last 168 hours. Cardiac Enzymes: No results for input(s): CKTOTAL, CKMB, CKMBINDEX, TROPONINI in the last 168 hours. BNP (last 3 results) No results for input(s): PROBNP in the last 8760 hours. HbA1C: No results for input(s): HGBA1C in the last 72 hours. CBG: Recent Labs  Lab 08/22/21 0555  GLUCAP 109*   Lipid Profile: No results for input(s): CHOL, HDL, LDLCALC, TRIG, CHOLHDL, LDLDIRECT in the last 72 hours. Thyroid Function Tests: No results for input(s): TSH, T4TOTAL, FREET4, T3FREE, THYROIDAB in the last 72 hours. Anemia Panel: No results for input(s): VITAMINB12, FOLATE, FERRITIN, TIBC, IRON, RETICCTPCT in the last 72 hours. Sepsis Labs: No results for input(s): PROCALCITON, LATICACIDVEN in the last 168 hours. Recent Results (from the past 240 hour(s))  Resp Panel by RT-PCR (Flu A&B, Covid) Nasopharyngeal Swab     Status: None   Collection Time: 08/18/21  1:18 PM   Specimen: Nasopharyngeal Swab; Nasopharyngeal(NP) swabs in vial transport medium  Result Value Ref Range Status   SARS Coronavirus 2 by RT PCR NEGATIVE NEGATIVE Final    Comment: (NOTE) SARS-CoV-2 target nucleic acids are NOT DETECTED.  The SARS-CoV-2 RNA is generally detectable in upper respiratory specimens during the acute phase of infection. The lowest concentration of SARS-CoV-2 viral copies this assay can detect is 138 copies/mL. A  negative result does not preclude SARS-Cov-2 infection and should not be used as the sole basis for treatment or other patient management decisions. A negative result may occur with  improper specimen collection/handling, submission of specimen other than nasopharyngeal swab, presence of viral mutation(s) within the areas targeted by this assay, and inadequate number of viral copies(<138 copies/mL). A negative result must be combined with clinical observations, patient history, and epidemiological information. The expected result is Negative.  Fact Sheet for Patients:  EntrepreneurPulse.com.au  Fact Sheet for Healthcare Providers:  IncredibleEmployment.be  This test is no t yet approved or cleared by the [place] FDA and  has been authorized for detection and/or diagnosis of SARS-CoV-2 by FDA under an Emergency Use Authorization (EUA). This EUA will remain  in effect (meaning this test can be used) for the duration of the COVID-19 declaration under Section 564(b)(1) of the Act, 21 U.S.C.section 360bbb-3(b)(1), unless the authorization is terminated  or revoked sooner.       Influenza A by  PCR NEGATIVE NEGATIVE Final   Influenza B by PCR NEGATIVE NEGATIVE Final    Comment: (NOTE) The Xpert Xpress SARS-CoV-2/FLU/RSV plus assay is intended as an aid in the diagnosis of influenza from Nasopharyngeal swab specimens and should not be used as a sole basis for treatment. Nasal washings and aspirates are unacceptable for Xpert Xpress SARS-CoV-2/FLU/RSV testing.  Fact Sheet for Patients: EntrepreneurPulse.com.au  Fact Sheet for Healthcare Providers: IncredibleEmployment.be  This test is not yet approved or cleared by the [place] FDA and has been authorized for detection and/or diagnosis of SARS-CoV-2 by FDA under an Emergency Use Authorization (EUA). This EUA will remain in effect (meaning this test can  be used) for the duration of the COVID-19 declaration under Section 564(b)(1) of the Act, 21 U.S.C. section 360bbb-3(b)(1), unless the authorization is terminated or revoked.  Performed at [facility], [place] [address] [place], [place] [address]   Urine Culture     Status: Abnormal   Collection Time: 08/19/21 12:30 AM   Specimen: Urine, Clean Catch  Result Value Ref Range Status   Specimen Description   Final    URINE, CLEAN CATCH Performed at [facility], [place] [address], [place], [place] [address]    Special Requests   Final    NONE Performed at [facility], [place] [address], [place], [place] [address]    Culture >=100,000 COLONIES/mL ESCHERICHIA COLI (A)  Final   Report Status 08/21/2021 FINAL  Final   Organism ID, Bacteria ESCHERICHIA COLI (A)  Final      Susceptibility   Escherichia coli - MIC*    AMPICILLIN <=2 SENSITIVE Sensitive     CEFAZOLIN <=4 SENSITIVE Sensitive     CEFEPIME <=0.12 SENSITIVE Sensitive     CEFTRIAXONE <=0.25 SENSITIVE Sensitive     CIPROFLOXACIN <=0.25 SENSITIVE Sensitive     GENTAMICIN <=1 SENSITIVE Sensitive     IMIPENEM <=0.25 SENSITIVE Sensitive     NITROFURANTOIN <=16 SENSITIVE Sensitive     TRIMETH/SULFA <=20 SENSITIVE Sensitive     AMPICILLIN/SULBACTAM <=2 SENSITIVE Sensitive     PIP/TAZO <=4 SENSITIVE Sensitive     * >=100,000 COLONIES/mL ESCHERICHIA COLI  SARS CORONAVIRUS 2 (TAT 6-24 HRS) Nasopharyngeal Nasopharyngeal Swab     Status: None   Collection Time: 08/21/21  7:00 PM   Specimen: Nasopharyngeal Swab  Result Value Ref Range Status   SARS Coronavirus 2 NEGATIVE NEGATIVE Final    Comment: (NOTE) SARS-CoV-2 target nucleic acids are NOT DETECTED.  The SARS-CoV-2 RNA is generally detectable in upper and lower respiratory specimens during the acute phase of infection. Negative results do not preclude SARS-CoV-2 infection, do not rule out co-infections with other pathogens, and  should not be used as the sole basis for treatment or other patient management decisions. Negative results must be combined with clinical observations, patient history, and epidemiological information. The expected result is Negative.  Fact Sheet for Patients: SugarRoll.be  Fact Sheet for Healthcare Providers: https://www.woods-mathews.com/  This test is not yet approved or cleared by the [place] FDA and  has been authorized for detection and/or diagnosis of SARS-CoV-2 by FDA under an Emergency Use Authorization (EUA). This EUA will remain  in effect (meaning this test can be used) for the duration of the COVID-19 declaration under Se ction 564(b)(1) of the Act, 21 U.S.C. section 360bbb-3(b)(1), unless the authorization is terminated or revoked sooner.  Performed at [facility], [place] [address], [place], [place] [address]    Radiology  Studies: No results found.  Scheduled Meds:  amLODipine  2.5 mg Oral Daily   apixaban  2.5 mg Oral BID   carvedilol  6.25 mg Oral BID   chlorhexidine  15 mL Mouth Rinse BID   feeding supplement  237 mL Oral TID BM   levothyroxine  88 mcg Oral Q0600   mouth rinse  15 mL Mouth Rinse q12n4p   multivitamin with minerals  1 tablet Oral Daily   sertraline  100 mg Oral Daily    LOS: 3 days  Flora Lipps, MD [facility] If 7PM-7AM, please contact night-coverage [email or web address]  08/22/2021, 11:14 AM

## 2021-08-23 ENCOUNTER — Other Ambulatory Visit: Payer: Self-pay

## 2021-08-23 DIAGNOSIS — I495 Sick sinus syndrome: Secondary | ICD-10-CM | POA: Diagnosis present

## 2021-08-23 DIAGNOSIS — R404 Transient alteration of awareness: Secondary | ICD-10-CM | POA: Diagnosis not present

## 2021-08-23 DIAGNOSIS — Z888 Allergy status to other drugs, medicaments and biological substances status: Secondary | ICD-10-CM | POA: Diagnosis not present

## 2021-08-23 DIAGNOSIS — E43 Unspecified severe protein-calorie malnutrition: Secondary | ICD-10-CM | POA: Diagnosis present

## 2021-08-23 DIAGNOSIS — R410 Disorientation, unspecified: Secondary | ICD-10-CM | POA: Diagnosis present

## 2021-08-23 DIAGNOSIS — I6782 Cerebral ischemia: Secondary | ICD-10-CM | POA: Diagnosis not present

## 2021-08-23 DIAGNOSIS — Z515 Encounter for palliative care: Secondary | ICD-10-CM | POA: Diagnosis not present

## 2021-08-23 DIAGNOSIS — N184 Chronic kidney disease, stage 4 (severe): Secondary | ICD-10-CM | POA: Diagnosis present

## 2021-08-23 DIAGNOSIS — I10 Essential (primary) hypertension: Secondary | ICD-10-CM | POA: Diagnosis not present

## 2021-08-23 DIAGNOSIS — R062 Wheezing: Secondary | ICD-10-CM | POA: Diagnosis not present

## 2021-08-23 DIAGNOSIS — R638 Other symptoms and signs concerning food and fluid intake: Secondary | ICD-10-CM | POA: Diagnosis not present

## 2021-08-23 DIAGNOSIS — R0902 Hypoxemia: Secondary | ICD-10-CM | POA: Diagnosis not present

## 2021-08-23 DIAGNOSIS — J449 Chronic obstructive pulmonary disease, unspecified: Secondary | ICD-10-CM | POA: Diagnosis present

## 2021-08-23 DIAGNOSIS — R778 Other specified abnormalities of plasma proteins: Secondary | ICD-10-CM | POA: Diagnosis not present

## 2021-08-23 DIAGNOSIS — G319 Degenerative disease of nervous system, unspecified: Secondary | ICD-10-CM | POA: Diagnosis not present

## 2021-08-23 DIAGNOSIS — I959 Hypotension, unspecified: Secondary | ICD-10-CM | POA: Diagnosis not present

## 2021-08-23 DIAGNOSIS — Z880 Allergy status to penicillin: Secondary | ICD-10-CM | POA: Diagnosis not present

## 2021-08-23 DIAGNOSIS — N179 Acute kidney failure, unspecified: Secondary | ICD-10-CM | POA: Diagnosis present

## 2021-08-23 DIAGNOSIS — Z885 Allergy status to narcotic agent status: Secondary | ICD-10-CM | POA: Diagnosis not present

## 2021-08-23 DIAGNOSIS — R627 Adult failure to thrive: Secondary | ICD-10-CM | POA: Diagnosis present

## 2021-08-23 DIAGNOSIS — E038 Other specified hypothyroidism: Secondary | ICD-10-CM | POA: Diagnosis not present

## 2021-08-23 DIAGNOSIS — E871 Hypo-osmolality and hyponatremia: Secondary | ICD-10-CM | POA: Diagnosis present

## 2021-08-23 DIAGNOSIS — I5032 Chronic diastolic (congestive) heart failure: Secondary | ICD-10-CM | POA: Diagnosis present

## 2021-08-23 DIAGNOSIS — N189 Chronic kidney disease, unspecified: Secondary | ICD-10-CM | POA: Diagnosis not present

## 2021-08-23 DIAGNOSIS — F32A Depression, unspecified: Secondary | ICD-10-CM | POA: Diagnosis present

## 2021-08-23 DIAGNOSIS — F419 Anxiety disorder, unspecified: Secondary | ICD-10-CM | POA: Diagnosis present

## 2021-08-23 DIAGNOSIS — I130 Hypertensive heart and chronic kidney disease with heart failure and stage 1 through stage 4 chronic kidney disease, or unspecified chronic kidney disease: Secondary | ICD-10-CM | POA: Diagnosis present

## 2021-08-23 DIAGNOSIS — I517 Cardiomegaly: Secondary | ICD-10-CM | POA: Diagnosis not present

## 2021-08-23 DIAGNOSIS — I739 Peripheral vascular disease, unspecified: Secondary | ICD-10-CM | POA: Diagnosis present

## 2021-08-23 DIAGNOSIS — R296 Repeated falls: Secondary | ICD-10-CM | POA: Diagnosis present

## 2021-08-23 DIAGNOSIS — Z20822 Contact with and (suspected) exposure to covid-19: Secondary | ICD-10-CM | POA: Diagnosis present

## 2021-08-23 DIAGNOSIS — Z7901 Long term (current) use of anticoagulants: Secondary | ICD-10-CM | POA: Diagnosis not present

## 2021-08-23 DIAGNOSIS — R4182 Altered mental status, unspecified: Secondary | ICD-10-CM | POA: Diagnosis not present

## 2021-08-23 DIAGNOSIS — Z8679 Personal history of other diseases of the circulatory system: Secondary | ICD-10-CM | POA: Diagnosis not present

## 2021-08-23 DIAGNOSIS — Z711 Person with feared health complaint in whom no diagnosis is made: Secondary | ICD-10-CM | POA: Diagnosis not present

## 2021-08-23 DIAGNOSIS — Z950 Presence of cardiac pacemaker: Secondary | ICD-10-CM | POA: Diagnosis not present

## 2021-08-23 DIAGNOSIS — E039 Hypothyroidism, unspecified: Secondary | ICD-10-CM | POA: Diagnosis present

## 2021-08-23 DIAGNOSIS — I2510 Atherosclerotic heart disease of native coronary artery without angina pectoris: Secondary | ICD-10-CM | POA: Diagnosis not present

## 2021-08-23 DIAGNOSIS — E785 Hyperlipidemia, unspecified: Secondary | ICD-10-CM | POA: Diagnosis not present

## 2021-08-23 DIAGNOSIS — Z66 Do not resuscitate: Secondary | ICD-10-CM | POA: Diagnosis present

## 2021-08-23 DIAGNOSIS — Z789 Other specified health status: Secondary | ICD-10-CM | POA: Diagnosis not present

## 2021-08-23 DIAGNOSIS — Z743 Need for continuous supervision: Secondary | ICD-10-CM | POA: Diagnosis not present

## 2021-08-23 DIAGNOSIS — F411 Generalized anxiety disorder: Secondary | ICD-10-CM | POA: Diagnosis not present

## 2021-08-23 DIAGNOSIS — I4819 Other persistent atrial fibrillation: Secondary | ICD-10-CM | POA: Diagnosis present

## 2021-08-23 DIAGNOSIS — Z681 Body mass index (BMI) 19 or less, adult: Secondary | ICD-10-CM | POA: Diagnosis not present

## 2021-08-23 DIAGNOSIS — R0989 Other specified symptoms and signs involving the circulatory and respiratory systems: Secondary | ICD-10-CM | POA: Diagnosis not present

## 2021-08-23 DIAGNOSIS — Z7189 Other specified counseling: Secondary | ICD-10-CM | POA: Diagnosis not present

## 2021-08-23 DIAGNOSIS — I480 Paroxysmal atrial fibrillation: Secondary | ICD-10-CM | POA: Diagnosis not present

## 2021-08-23 DIAGNOSIS — J9621 Acute and chronic respiratory failure with hypoxia: Secondary | ICD-10-CM | POA: Diagnosis not present

## 2021-08-23 DIAGNOSIS — M1990 Unspecified osteoarthritis, unspecified site: Secondary | ICD-10-CM | POA: Diagnosis not present

## 2021-08-23 DIAGNOSIS — R531 Weakness: Secondary | ICD-10-CM | POA: Diagnosis not present

## 2021-08-23 DIAGNOSIS — J189 Pneumonia, unspecified organism: Secondary | ICD-10-CM | POA: Diagnosis not present

## 2021-08-23 DIAGNOSIS — R1312 Dysphagia, oropharyngeal phase: Secondary | ICD-10-CM | POA: Diagnosis not present

## 2021-08-23 LAB — MAGNESIUM: Magnesium: 1.8 mg/dL (ref 1.7–2.4)

## 2021-08-23 NOTE — TOC Transition Note (Signed)
Transition of Care [facility]) - CM/SW Discharge Note   Patient Details  Name: [name] MRN: [id number] Date of Birth: 02-21-1924  Transition of Care [facility]) CM/SW Contact:  Leeroy Cha, RN Phone Number: 08/23/2021, 9:17 AM   Clinical Narrative:    Pilar Grammes for patient to come.  Patient has been dcd this am.  Tct-ron ward clerk will check with rn and call back.  Med. Nec form updated.   Final next level of care: [place] Barriers to Discharge: Continued Medical Work up   Patient Goals and CMS Choice Patient states their goals for this hospitalization and ongoing recovery are:: To go to SNF for short term rehab, then return back home. CMS Medicare.gov Compare Post Acute Care list provided to:: Patient Represenative (must comment) Choice offered to / list presented to : Adult Children  Discharge Placement                       Discharge Plan and Services                                     Social Determinants of Health (SDOH) Interventions     Readmission Risk Interventions Readmission Risk Prevention Plan 08/22/2021  Transportation Screening Complete  PCP or Specialist Appt within 5-7 Days Complete  Home Care Screening Complete  Medication Review (RN CM) Complete  Some recent data might be hidden

## 2021-08-23 NOTE — Patient Outreach (Signed)
[facility]) Care Management  08/23/2021  ADILYNNE GAUMER [birth year] [id number]   Case closure  RN CM received notification from [facility] hospital liaison that patient discharged to SNF.   Plan: RN CM will close case.  Enzo Montgomery, RN,BSN,CCM [place] Management Telephonic Care Management Coordinator Direct Phone: [phone] Toll Free: [phone] Fax: [phone]

## 2021-08-23 NOTE — Progress Notes (Signed)
Called [facility] and spoke with RN  for report on pt.

## 2021-08-23 NOTE — Plan of Care (Signed)
  Problem: Clinical Measurements: Goal: Respiratory complications will improve Outcome: Adequate for Discharge   Problem: Clinical Measurements: Goal: Cardiovascular complication will be avoided Outcome: Adequate for Discharge   Problem: Education: Goal: Knowledge of General Education information will improve Description: Including pain rating scale, medication(s)/side effects and non-pharmacologic comfort measures Outcome: Adequate for Discharge   Problem: Health Behavior/Discharge Planning: Goal: Ability to manage health-related needs will improve Outcome: Adequate for Discharge

## 2021-08-23 NOTE — TOC Transition Note (Signed)
Transition of Care [facility]) - CM/SW Discharge Note   Patient Details  Name: [name] MRN: [id number] Date of Birth: 1924/01/22  Transition of Care [facility]) CM/SW Contact:  Leeroy Cha, RN Phone Number: 08/23/2021, 9:27 AM   Clinical Narrative:    Pt can go to [facility] per .Soy, will still go to room [place] call report to [phone].  [facility] called at [phone] for transport.  Unit clerk Chriss Czar has transport packet.   Final next level of care: [place] Barriers to Discharge: Continued Medical Work up   Patient Goals and CMS Choice Patient states their goals for this hospitalization and ongoing recovery are:: To go to SNF for short term rehab, then return back home. CMS Medicare.gov Compare Post Acute Care list provided to:: Patient Represenative (must comment) Choice offered to / list presented to : Adult Children  Discharge Placement                       Discharge Plan and Services                                     Social Determinants of Health (SDOH) Interventions     Readmission Risk Interventions Readmission Risk Prevention Plan 08/22/2021  Transportation Screening Complete  PCP or Specialist Appt within 5-7 Days Complete  Home Care Screening Complete  Medication Review (RN CM) Complete  Some recent data might be hidden

## 2021-08-24 DIAGNOSIS — R778 Other specified abnormalities of plasma proteins: Secondary | ICD-10-CM | POA: Diagnosis not present

## 2021-08-24 DIAGNOSIS — N179 Acute kidney failure, unspecified: Secondary | ICD-10-CM | POA: Diagnosis not present

## 2021-08-24 DIAGNOSIS — E43 Unspecified severe protein-calorie malnutrition: Secondary | ICD-10-CM | POA: Diagnosis not present

## 2021-08-24 DIAGNOSIS — J9621 Acute and chronic respiratory failure with hypoxia: Secondary | ICD-10-CM | POA: Diagnosis not present

## 2021-08-24 DIAGNOSIS — I480 Paroxysmal atrial fibrillation: Secondary | ICD-10-CM | POA: Diagnosis not present

## 2021-08-24 DIAGNOSIS — N189 Chronic kidney disease, unspecified: Secondary | ICD-10-CM | POA: Diagnosis not present

## 2021-08-24 DIAGNOSIS — E039 Hypothyroidism, unspecified: Secondary | ICD-10-CM | POA: Diagnosis not present

## 2021-08-24 DIAGNOSIS — E785 Hyperlipidemia, unspecified: Secondary | ICD-10-CM | POA: Diagnosis not present

## 2021-08-24 DIAGNOSIS — J449 Chronic obstructive pulmonary disease, unspecified: Secondary | ICD-10-CM | POA: Diagnosis not present

## 2021-08-25 DIAGNOSIS — E43 Unspecified severe protein-calorie malnutrition: Secondary | ICD-10-CM | POA: Diagnosis not present

## 2021-08-25 DIAGNOSIS — F411 Generalized anxiety disorder: Secondary | ICD-10-CM | POA: Diagnosis not present

## 2021-08-25 DIAGNOSIS — R778 Other specified abnormalities of plasma proteins: Secondary | ICD-10-CM | POA: Diagnosis not present

## 2021-08-25 DIAGNOSIS — J449 Chronic obstructive pulmonary disease, unspecified: Secondary | ICD-10-CM | POA: Diagnosis not present

## 2021-08-25 DIAGNOSIS — E039 Hypothyroidism, unspecified: Secondary | ICD-10-CM | POA: Diagnosis not present

## 2021-08-25 DIAGNOSIS — N179 Acute kidney failure, unspecified: Secondary | ICD-10-CM | POA: Diagnosis not present

## 2021-08-25 DIAGNOSIS — I480 Paroxysmal atrial fibrillation: Secondary | ICD-10-CM | POA: Diagnosis not present

## 2021-08-29 NOTE — Progress Notes (Signed)
Patient seen and examined at bedside.  No interval changes noted.  Please refer to discharge instructions from 08/22/2021.

## 2021-08-31 DIAGNOSIS — J189 Pneumonia, unspecified organism: Secondary | ICD-10-CM | POA: Diagnosis not present

## 2021-08-31 DIAGNOSIS — J449 Chronic obstructive pulmonary disease, unspecified: Secondary | ICD-10-CM | POA: Diagnosis not present

## 2021-08-31 DIAGNOSIS — N179 Acute kidney failure, unspecified: Secondary | ICD-10-CM | POA: Diagnosis not present

## 2021-08-31 DIAGNOSIS — R0989 Other specified symptoms and signs involving the circulatory and respiratory systems: Secondary | ICD-10-CM | POA: Diagnosis not present

## 2021-08-31 DIAGNOSIS — N189 Chronic kidney disease, unspecified: Secondary | ICD-10-CM | POA: Diagnosis not present

## 2021-08-31 DIAGNOSIS — I480 Paroxysmal atrial fibrillation: Secondary | ICD-10-CM | POA: Diagnosis not present

## 2021-09-03 ENCOUNTER — Encounter (HOSPITAL_COMMUNITY): Payer: Self-pay | Admitting: Emergency Medicine

## 2021-09-03 ENCOUNTER — Inpatient Hospital Stay (HOSPITAL_COMMUNITY)
Admission: EM | Admit: 2021-09-03 | Discharge: 2021-09-04 | DRG: 951 | Disposition: A | Discharge status: Hospice | Payer: Medicare PPO | Source: Skilled Nursing Facility | Attending: Student in an Organized Health Care Education/Training Program | Admitting: Student in an Organized Health Care Education/Training Program

## 2021-09-03 ENCOUNTER — Emergency Department (HOSPITAL_COMMUNITY): Payer: Medicare PPO

## 2021-09-03 ENCOUNTER — Encounter: Payer: Self-pay | Admitting: Cardiology

## 2021-09-03 DIAGNOSIS — I517 Cardiomegaly: Secondary | ICD-10-CM | POA: Diagnosis not present

## 2021-09-03 DIAGNOSIS — I495 Sick sinus syndrome: Secondary | ICD-10-CM | POA: Diagnosis present

## 2021-09-03 DIAGNOSIS — E871 Hypo-osmolality and hyponatremia: Secondary | ICD-10-CM | POA: Diagnosis present

## 2021-09-03 DIAGNOSIS — Z20822 Contact with and (suspected) exposure to covid-19: Secondary | ICD-10-CM | POA: Diagnosis present

## 2021-09-03 DIAGNOSIS — R638 Other symptoms and signs concerning food and fluid intake: Secondary | ICD-10-CM | POA: Diagnosis not present

## 2021-09-03 DIAGNOSIS — R627 Adult failure to thrive: Secondary | ICD-10-CM | POA: Diagnosis present

## 2021-09-03 DIAGNOSIS — R410 Disorientation, unspecified: Secondary | ICD-10-CM

## 2021-09-03 DIAGNOSIS — Z888 Allergy status to other drugs, medicaments and biological substances status: Secondary | ICD-10-CM | POA: Diagnosis not present

## 2021-09-03 DIAGNOSIS — I130 Hypertensive heart and chronic kidney disease with heart failure and stage 1 through stage 4 chronic kidney disease, or unspecified chronic kidney disease: Secondary | ICD-10-CM | POA: Diagnosis present

## 2021-09-03 DIAGNOSIS — E43 Unspecified severe protein-calorie malnutrition: Secondary | ICD-10-CM | POA: Diagnosis present

## 2021-09-03 DIAGNOSIS — Z8679 Personal history of other diseases of the circulatory system: Secondary | ICD-10-CM

## 2021-09-03 DIAGNOSIS — E039 Hypothyroidism, unspecified: Secondary | ICD-10-CM | POA: Diagnosis present

## 2021-09-03 DIAGNOSIS — Z880 Allergy status to penicillin: Secondary | ICD-10-CM | POA: Diagnosis not present

## 2021-09-03 DIAGNOSIS — Z801 Family history of malignant neoplasm of trachea, bronchus and lung: Secondary | ICD-10-CM

## 2021-09-03 DIAGNOSIS — Z8249 Family history of ischemic heart disease and other diseases of the circulatory system: Secondary | ICD-10-CM

## 2021-09-03 DIAGNOSIS — N189 Chronic kidney disease, unspecified: Secondary | ICD-10-CM

## 2021-09-03 DIAGNOSIS — R404 Transient alteration of awareness: Secondary | ICD-10-CM | POA: Diagnosis not present

## 2021-09-03 DIAGNOSIS — J449 Chronic obstructive pulmonary disease, unspecified: Secondary | ICD-10-CM | POA: Diagnosis present

## 2021-09-03 DIAGNOSIS — I5032 Chronic diastolic (congestive) heart failure: Secondary | ICD-10-CM | POA: Diagnosis present

## 2021-09-03 DIAGNOSIS — Z711 Person with feared health complaint in whom no diagnosis is made: Secondary | ICD-10-CM | POA: Diagnosis not present

## 2021-09-03 DIAGNOSIS — Z7901 Long term (current) use of anticoagulants: Secondary | ICD-10-CM | POA: Diagnosis not present

## 2021-09-03 DIAGNOSIS — R296 Repeated falls: Secondary | ICD-10-CM | POA: Diagnosis present

## 2021-09-03 DIAGNOSIS — Z823 Family history of stroke: Secondary | ICD-10-CM

## 2021-09-03 DIAGNOSIS — I4819 Other persistent atrial fibrillation: Secondary | ICD-10-CM | POA: Diagnosis present

## 2021-09-03 DIAGNOSIS — N179 Acute kidney failure, unspecified: Secondary | ICD-10-CM | POA: Diagnosis present

## 2021-09-03 DIAGNOSIS — N184 Chronic kidney disease, stage 4 (severe): Secondary | ICD-10-CM | POA: Diagnosis present

## 2021-09-03 DIAGNOSIS — I739 Peripheral vascular disease, unspecified: Secondary | ICD-10-CM | POA: Diagnosis present

## 2021-09-03 DIAGNOSIS — R0902 Hypoxemia: Secondary | ICD-10-CM | POA: Diagnosis not present

## 2021-09-03 DIAGNOSIS — Z885 Allergy status to narcotic agent status: Secondary | ICD-10-CM

## 2021-09-03 DIAGNOSIS — Z789 Other specified health status: Secondary | ICD-10-CM | POA: Diagnosis not present

## 2021-09-03 DIAGNOSIS — Z681 Body mass index (BMI) 19 or less, adult: Secondary | ICD-10-CM | POA: Diagnosis not present

## 2021-09-03 DIAGNOSIS — Z800 Family history of malignant neoplasm of digestive organs: Secondary | ICD-10-CM

## 2021-09-03 DIAGNOSIS — F419 Anxiety disorder, unspecified: Secondary | ICD-10-CM | POA: Diagnosis present

## 2021-09-03 DIAGNOSIS — Z79899 Other long term (current) drug therapy: Secondary | ICD-10-CM

## 2021-09-03 DIAGNOSIS — I2510 Atherosclerotic heart disease of native coronary artery without angina pectoris: Secondary | ICD-10-CM | POA: Diagnosis not present

## 2021-09-03 DIAGNOSIS — Z66 Do not resuscitate: Secondary | ICD-10-CM | POA: Diagnosis present

## 2021-09-03 DIAGNOSIS — Z950 Presence of cardiac pacemaker: Secondary | ICD-10-CM | POA: Diagnosis present

## 2021-09-03 DIAGNOSIS — Z7189 Other specified counseling: Secondary | ICD-10-CM | POA: Diagnosis not present

## 2021-09-03 DIAGNOSIS — Z96642 Presence of left artificial hip joint: Secondary | ICD-10-CM | POA: Diagnosis present

## 2021-09-03 DIAGNOSIS — E038 Other specified hypothyroidism: Secondary | ICD-10-CM | POA: Diagnosis not present

## 2021-09-03 DIAGNOSIS — M1990 Unspecified osteoarthritis, unspecified site: Secondary | ICD-10-CM | POA: Diagnosis not present

## 2021-09-03 DIAGNOSIS — R54 Age-related physical debility: Secondary | ICD-10-CM | POA: Diagnosis present

## 2021-09-03 DIAGNOSIS — I10 Essential (primary) hypertension: Secondary | ICD-10-CM | POA: Diagnosis not present

## 2021-09-03 DIAGNOSIS — I959 Hypotension, unspecified: Secondary | ICD-10-CM | POA: Diagnosis not present

## 2021-09-03 DIAGNOSIS — F32A Depression, unspecified: Secondary | ICD-10-CM | POA: Diagnosis present

## 2021-09-03 DIAGNOSIS — Z79890 Hormone replacement therapy (postmenopausal): Secondary | ICD-10-CM

## 2021-09-03 DIAGNOSIS — J189 Pneumonia, unspecified organism: Secondary | ICD-10-CM | POA: Diagnosis not present

## 2021-09-03 DIAGNOSIS — Z515 Encounter for palliative care: Secondary | ICD-10-CM | POA: Diagnosis not present

## 2021-09-03 DIAGNOSIS — I6782 Cerebral ischemia: Secondary | ICD-10-CM | POA: Diagnosis not present

## 2021-09-03 DIAGNOSIS — R1312 Dysphagia, oropharyngeal phase: Secondary | ICD-10-CM | POA: Diagnosis not present

## 2021-09-03 DIAGNOSIS — G319 Degenerative disease of nervous system, unspecified: Secondary | ICD-10-CM | POA: Diagnosis not present

## 2021-09-03 DIAGNOSIS — R4182 Altered mental status, unspecified: Secondary | ICD-10-CM | POA: Diagnosis not present

## 2021-09-03 LAB — CBC WITH DIFFERENTIAL/PLATELET
Abs Immature Granulocytes: 0.04 10*3/uL (ref 0.00–0.07)
Basophils Absolute: 0 10*3/uL (ref 0.0–0.1)
Basophils Relative: 0 %
Eosinophils Absolute: 0 10*3/uL (ref 0.0–0.5)
Eosinophils Relative: 0 %
HCT: 43.8 % (ref 36.0–46.0)
Hemoglobin: 13.3 g/dL (ref 12.0–15.0)
Immature Granulocytes: 1 %
Lymphocytes Relative: 6 %
Lymphs Abs: 0.5 10*3/uL — ABNORMAL LOW (ref 0.7–4.0)
MCH: 30.9 pg (ref 26.0–34.0)
MCHC: 30.4 g/dL (ref 30.0–36.0)
MCV: 101.6 fL — ABNORMAL HIGH (ref 80.0–100.0)
Monocytes Absolute: 0.4 10*3/uL (ref 0.1–1.0)
Monocytes Relative: 4 %
Neutro Abs: 7.9 10*3/uL — ABNORMAL HIGH (ref 1.7–7.7)
Neutrophils Relative %: 89 %
Platelets: 130 10*3/uL — ABNORMAL LOW (ref 150–400)
RBC: 4.31 MIL/uL (ref 3.87–5.11)
RDW: 14.9 % (ref 11.5–15.5)
WBC: 8.7 10*3/uL (ref 4.0–10.5)
nRBC: 0 % (ref 0.0–0.2)

## 2021-09-03 LAB — PROTIME-INR
INR: 1.3 — ABNORMAL HIGH (ref 0.8–1.2)
Prothrombin Time: 16.3 seconds — ABNORMAL HIGH (ref 11.4–15.2)

## 2021-09-03 LAB — RESP PANEL BY RT-PCR (FLU A&B, COVID) ARPGX2
Influenza A by PCR: NEGATIVE
Influenza B by PCR: NEGATIVE
SARS Coronavirus 2 by RT PCR: NEGATIVE

## 2021-09-03 LAB — COMPREHENSIVE METABOLIC PANEL
ALT: 14 U/L (ref 0–44)
AST: 19 U/L (ref 15–41)
Albumin: 3.4 g/dL — ABNORMAL LOW (ref 3.5–5.0)
Alkaline Phosphatase: 46 U/L (ref 38–126)
Anion gap: 11 (ref 5–15)
BUN: 61 mg/dL — ABNORMAL HIGH (ref 8–23)
CO2: 34 mmol/L — ABNORMAL HIGH (ref 22–32)
Calcium: 8.4 mg/dL — ABNORMAL LOW (ref 8.9–10.3)
Chloride: 87 mmol/L — ABNORMAL LOW (ref 98–111)
Creatinine, Ser: 2.29 mg/dL — ABNORMAL HIGH (ref 0.44–1.00)
GFR, Estimated: 19 mL/min — ABNORMAL LOW (ref >60)
Glucose, Bld: 108 mg/dL — ABNORMAL HIGH (ref 70–99)
Potassium: 4.5 mmol/L (ref 3.5–5.1)
Sodium: 132 mmol/L — ABNORMAL LOW (ref 135–145)
Total Bilirubin: 0.8 mg/dL (ref 0.3–1.2)
Total Protein: 6 g/dL — ABNORMAL LOW (ref 6.5–8.1)

## 2021-09-03 LAB — LIPASE, BLOOD: Lipase: 39 U/L (ref 11–51)

## 2021-09-03 LAB — AMMONIA: Ammonia: 25 umol/L (ref 9–35)

## 2021-09-03 LAB — LACTIC ACID, PLASMA: Lactic Acid, Venous: 1.4 mmol/L (ref 0.5–1.9)

## 2021-09-03 LAB — APTT: aPTT: 33 seconds (ref 24–36)

## 2021-09-03 LAB — CBG MONITORING, ED: Glucose-Capillary: 89 mg/dL (ref 70–99)

## 2021-09-03 MED ORDER — ENOXAPARIN SODIUM 40 MG/0.4ML IJ SOSY: 40.0000 mg | PREFILLED_SYRINGE | INTRAMUSCULAR | Status: DC

## 2021-09-03 MED ORDER — SODIUM CHLORIDE 0.9 % IV BOLUS
1000.0000 mL | Freq: Once | INTRAVENOUS | Status: AC
  Administered 2021-09-03: 1000 mL via INTRAVENOUS

## 2021-09-03 MED ORDER — LACTATED RINGERS IV SOLN: INTRAVENOUS | Status: AC

## 2021-09-03 MED ORDER — APIXABAN 2.5 MG PO TABS
2.5000 mg | ORAL_TABLET | Freq: Two times a day (BID) | ORAL | Status: DC
Start: 2021-09-03 — End: 2021-09-04
  Administered 2021-09-03: 2.5 mg via ORAL
  Filled 2021-09-03: qty 1

## 2021-09-03 NOTE — ED Notes (Addendum)
Pt had BM, cleaned, and changed. Placed a sacral foam.

## 2021-09-03 NOTE — Assessment & Plan Note (Addendum)
Pretty much ventricular paced rhythm.  Has not had any issues with A. fib. Remains on carvedilol 6.25 mg twice daily.  This could be increased if necessary for blood pressure control. Remains on apixaban with no bleeding issues.  No CVA/TIA symptoms.

## 2021-09-03 NOTE — ED Notes (Signed)
Patient transported to CT 

## 2021-09-03 NOTE — H&P (Addendum)
History and Physical    [name] ZWC:[id number] DOB: 07/02/1924 DOA: 09/03/2021  PCP: [name], MD  Patient coming from: SNF  I have personally briefly reviewed patient's old medical records in [place]  Chief Complaint: Decreased oral intake, failure to thrive  HPI: [name] is a 87 y.o. female with medical history significant for tacky-bradycardia syndrome s/p pacemaker, LBBB, CKD stage IV, persistent atrial fibrillation on Eliquis, chronic diastolic heart failure, COPD with chronic hypoxemia on 2 L, peripheral vascular disease, history of esophageal strictures, anxiety, depression who presents to the ER with concerns of persistent decreased oral intake and failure to thrive.  Son at bedside provides history as patient is only minimally verbal.  Patient was independent for the better part of this past year and then later moved in with family after she began to experience more weight loss, decreased oral intake and generalized weakness.  States she was previously about 130 pounds but has now gone down to about 90 in the past several months.  She was admitted to [facility] about 2 weeks ago with confusion, generalized weakness and decreased oral intake and found to have AKI, acute hypoxic respiratory failure with chest x-ray showing left basilar atrial ectasis and was discharged on 2 L and discharged to skilled nursing facility.  Since being in skilled nursing facility she has had progressive decline in her general health.  Had decreased oral intake.  Constantly states that she wants to return home.  She at baseline is able to carry on conversation but more recently has not even been able to speak.  Son is not quite sure what the long-term goal is and whether they want her to return home with family.  He just wants her to be "comfortable."  ED Course: She was afebrile, normotensive and on 3 L via nasal cannula.  No leukocytosis or anemia.  Slight thrombocytopenia  with platelet of 130.  Sodium of 132.  Creatinine elevated to 2.29 from 1.35 at discharge 2 weeks ago.  Ammonia level of 25. CT head was negative.  Review of Systems: Patient denies frank pain but otherwise unable to verbalize much symptoms  Past Medical History:  Diagnosis Date   Allergy    Anemia    Anxiety    Aortic atherosclerosis ([place]) 07/21/2021   Arthritis    Asthma    Benign positional vertigo    Blood transfusion without reported diagnosis    Cardiac pacemaker in situ 05/01/2015   for syncope & Sx Bradycardia -> Medtronic MRI compatible pacemaker placed Dr. Caryl Comes    Cataract    Chronic diarrhea    Chronic kidney disease, stage IV (severe) (HCC)    Colon polyps    Diastolic dysfunction, left ventricle 11/03/2013   Diverticulosis    Esophageal stricture    GERD (gastroesophageal reflux disease)    Gout    Heart murmur    Hemorrhoids    Hyperlipidemia    Hypertensive heart disease    no significant RAS by MRA 2002- (<30% LRAS)    Hypothyroidism    IBS (irritable bowel syndrome)    LBBB (left bundle branch block)- new 11/01/13 11/01/2013   Paroxysmal atrial fibrillation 11/01/2013   Intermittent through the years and recurrent in December of 2014 treated with amiodarone;; Negative Myoview 10/2013   Peripheral vascular disease    23-53% LICA, 6-14% RICA by doppler 2009    Sinus arrest 04/28/2015   Syncope and collapse 04/28/2015   s/p PPM  Tubulovillous adenoma 4/07    Past Surgical History:  Procedure Laterality Date   APPENDECTOMY     CATARACT EXTRACTION     EP IMPLANTABLE DEVICE N/A 05/01/2015   Procedure: Pacemaker Implant;  Surgeon: Deboraha Sprang, MD;  Location: [place] [facility];  Service: Cardiovascular;  Laterality: N/A;   HIP ARTHROPLASTY Left 05/01/2015   Procedure: ARTHROPLASTY BIPOLAR HIP (HEMIARTHROPLASTY);  Surgeon: Rod Can, MD;  Location: [facility];  Service: Orthopedics;  Laterality: Left;   LOW ANTERIOR BOWEL RESECTION  7/08   LUMBAR [place]  SURGERY  2000   NM MYOVIEW LTD  03/18/2013   Negative for ischemia or infarction. EF 55%.  LOW RISK   OOPHORECTOMY  1962   right   squamous cell skin cancer removed     TRANSTHORACIC ECHOCARDIOGRAM  10/2013, 02/2017   A) EF 55-60%, mild LVH, elevated bili pressures. Mild aortic valve calcification;; B) Normal LV size and function with mild LVH. Unable to assess diastolic function. Normal PA pressures. Mild MR,Mild AI     reports that she has never smoked. She has never used smokeless tobacco. She reports that she does not drink alcohol and does not use drugs. Social History  Allergies  Allergen Reactions   Penicillins Swelling    REACTION: nausea, swelling   Xifaxan [Rifaximin] Other (See Comments) and Rash    unknown   Ambien [Zolpidem Tartrate]     " made her crazy "   Amoxicillin     REACTION: unspecified   Ceftin [Cefuroxime Axetil] Diarrhea   Codeine Phosphate     REACTION: unspecified   Colchicine     Severe diarrhea   Levofloxacin Other (See Comments)    Tingle numbness   Lipitor [Atorvastatin] Other (See Comments)    "makes legs jump all night"    Family History  Problem Relation Age of Onset   Cancer Mother 31       unknown   Heart attack Father    Heart disease Brother    Stroke Maternal Grandmother    Lung cancer Maternal Grandfather    Stroke Paternal Grandmother    Colon cancer Daughter    Heart attack Son    Esophageal cancer Neg Hx    Rectal cancer Neg Hx    Stomach cancer Neg Hx      Prior to Admission medications   Medication Sig Start Date End Date Taking? Authorizing Provider  acetaminophen (TYLENOL) 325 MG tablet Take 2 tablets (650 mg total) by mouth every 6 (six) hours as needed for mild pain (or Fever >/= 101). 12/17/17   Debbe Odea, MD  amLODipine (NORVASC) 2.5 MG tablet TAKE 1 TABLET BY MOUTH EVERY DAY Patient taking differently: Take 2.5 mg by mouth daily. 05/16/21   [name], MD  apixaban (ELIQUIS) 2.5 MG TABS tablet Take 1  tablet (2.5 mg total) by mouth 2 (two) times daily. 08/07/20   [name], MD  carvedilol (COREG) 6.25 MG tablet Take 1 tablet (6.25 mg total) by mouth 2 (two) times daily. 09/22/20   [name], MD  feeding supplement (ENSURE ENLIVE / ENSURE PLUS) LIQD Take 237 mLs by mouth 3 (three) times daily between meals. 08/22/21 08/22/22  Pokhrel, Corrie Mckusick, MD  hydrALAZINE (APRESOLINE) 100 MG tablet Take 1.5 tablets (150 mg total) by mouth 2 (two) times daily. 08/07/21   [name], MD  levothyroxine (SYNTHROID) 88 MCG tablet TAKE 1 TABLET DAILY BEFORE BREAKFAST Patient taking differently: Take 88 mcg by mouth See  admin instructions. TAKE 1 TABLET DAILY BEFORE BREAKFAST 10/17/20   [name], MD  LORazepam (ATIVAN) 0.5 MG tablet Take 1 tablet (0.5 mg total) by mouth every 6 (six) hours as needed for anxiety. 08/22/21 08/22/22  Pokhrel, Corrie Mckusick, MD  nitroGLYCERIN (NITROSTAT) 0.4 MG SL tablet Place 1 tablet (0.4 mg total) under the tongue every 5 (five) minutes x 3 doses as needed for chest pain. 11/22/20   [name], MD  sertraline (ZOLOFT) 100 MG tablet Take 1 tablet (100 mg total) by mouth daily. 04/11/21   [name], MD  temazepam (RESTORIL) 15 MG capsule Take 1 capsule (15 mg total) by mouth at bedtime as needed for sleep. TAKE 2 CAPSULE BY MOUTH EVERY NIGHT AT BEDTIME AS NEEDED FOR SLEEP 08/22/21 08/22/22  Flora Lipps, MD    Physical Exam: Vitals:   09/03/21 1700 09/03/21 1830 09/03/21 1845 09/03/21 1900  BP: 136/61 (!) 127/53 (!) 123/59 127/61  Pulse: 61 (!) 59 63 (!) 59  Resp: 20 (!) 21 13 (!) 21  Temp:      TempSrc:      SpO2: 96% 99% 95% 99%    Constitutional: NAD, calm, comfortable, chronically ill-appearing thin cachectic elderly woman with muscle wasting laying flat in bed. Attempts to answer questions but could only mouth words but making minimal sound Vitals:   09/03/21 1700 09/03/21 1830 09/03/21 1845 09/03/21 1900  BP: 136/61 (!) 127/53 (!) 123/59 127/61   Pulse: 61 (!) 59 63 (!) 59  Resp: 20 (!) 21 13 (!) 21  Temp:      TempSrc:      SpO2: 96% 99% 95% 99%   Eyes: PERRL, lids and conjunctivae normal ENMT: Mucous membranes are dry with food particles stuck to palate Neck: normal, supple Respiratory: clear to auscultation bilaterally, no wheezing, no crackles. Normal respiratory effort On 3 L Cardiovascular: Regular rate and rhythm, no murmurs / rubs / gallops. No extremity edema.  Abdomen: no tenderness, no masses palpated.  Bowel sounds positive.  Musculoskeletal: no clubbing / cyanosis.  Cachectic with muscle wasting throughout. Skin: no rashes, lesions, ulcers. No induration Neurologic: CN 2-12 grossly intact. Sensation intact, Strength 4/5 in all lower extremity Psychiatric: alert and was able to say Son's name after repeated prompting    Labs on Admission: I have personally reviewed following labs and imaging studies  CBC: Recent Labs  Lab 09/03/21 1517  WBC 8.7  NEUTROABS 7.9*  HGB 13.3  HCT 43.8  MCV 101.6*  PLT 756*   Basic Metabolic Panel: Recent Labs  Lab 09/03/21 1517  NA 132*  K 4.5  CL 87*  CO2 34*  GLUCOSE 108*  BUN 61*  CREATININE 2.29*  CALCIUM 8.4*   GFR: CrCl cannot be calculated (Unknown ideal weight.). Liver Function Tests: Recent Labs  Lab 09/03/21 1517  AST 19  ALT 14  ALKPHOS 46  BILITOT 0.8  PROT 6.0*  ALBUMIN 3.4*   Recent Labs  Lab 09/03/21 1517  LIPASE 39   Recent Labs  Lab 09/03/21 1518  AMMONIA 25   Coagulation Profile: Recent Labs  Lab 09/03/21 1517  INR 1.3*   Cardiac Enzymes: No results for input(s): CKTOTAL, CKMB, CKMBINDEX, TROPONINI in the last 168 hours. BNP (last 3 results) No results for input(s): PROBNP in the last 8760 hours. HbA1C: No results for input(s): HGBA1C in the last 72 hours. CBG: No results for input(s): GLUCAP in the last 168 hours. Lipid Profile: No results for input(s): CHOL, HDL, LDLCALC,  TRIG, CHOLHDL, LDLDIRECT in the last 72  hours. Thyroid Function Tests: No results for input(s): TSH, T4TOTAL, FREET4, T3FREE, THYROIDAB in the last 72 hours. Anemia Panel: No results for input(s): VITAMINB12, FOLATE, FERRITIN, TIBC, IRON, RETICCTPCT in the last 72 hours. Urine analysis:    Component Value Date/Time   COLORURINE AMBER (A) 08/19/2021 0030   APPEARANCEUR HAZY (A) 08/19/2021 0030   LABSPEC 1.014 08/19/2021 0030   PHURINE 5.0 08/19/2021 0030   GLUCOSEU NEGATIVE 08/19/2021 0030   HGBUR NEGATIVE 08/19/2021 0030   HGBUR negative 02/22/2008 0000   BILIRUBINUR NEGATIVE 08/19/2021 0030   BILIRUBINUR negative 06/28/2016 1657   KETONESUR 5 (A) 08/19/2021 0030   PROTEINUR 100 (A) 08/19/2021 0030   UROBILINOGEN 0.2 06/28/2016 1657   UROBILINOGEN 0.2 12/03/2013 1942   NITRITE NEGATIVE 08/19/2021 0030   LEUKOCYTESUR MODERATE (A) 08/19/2021 0030    Radiological Exams on Admission: CT HEAD WO CONTRAST  Result Date: 09/03/2021 CLINICAL DATA:  Delirium. EXAM: CT HEAD WITHOUT CONTRAST TECHNIQUE: Contiguous axial images were obtained from the base of the skull through the vertex without intravenous contrast. COMPARISON:  Head CT 08/18/2021. FINDINGS: Brain: Mild-to-moderate generalized cerebral atrophy. Comparatively mild cerebellar atrophy. Mild patchy and ill-defined hypoattenuation within the cerebral white matter, nonspecific but compatible with chronic small vessel ischemic disease. There is no acute intracranial hemorrhage. No demarcated cortical infarct. No extra-axial fluid collection. No evidence of an intracranial mass. No midline shift. Vascular: No hyperdense vessel.  Atherosclerotic calcifications. Skull: Normal. Negative for fracture or focal lesion. Sinuses/Orbits: Visualized orbits show no acute finding. Mild mucosal thickening within the left greater than right maxillary sinuses. Other: Trace fluid within the bilateral mastoid air cells. IMPRESSION: No evidence of acute intracranial abnormality. Mild chronic small  vessel ischemic changes within the cerebral white matter. Mild-to-moderate generalized cerebral atrophy. Comparatively mild cerebellar atrophy. Mild mucosal thickening within the bilateral maxillary sinuses. Trace fluid within the bilateral mastoid air cells. Electronically Signed   By: Kellie Simmering D.O.   On: 09/03/2021 17:52   DG Chest Port 1 View  Result Date: 09/03/2021 CLINICAL DATA:  Question sepsis. EXAM: PORTABLE CHEST 1 VIEW COMPARISON:  08/19/2021 FINDINGS: Chronic enlargement of the cardiac silhouette. Dual lead pacemaker as seen previously. Abnormal opacity in the lower chest on both sides which could be due to a combination of pleural fluid, volume loss and or basilar pneumonia. Upper lungs appear clear. IMPRESSION: Chronic cardiomegaly. Abnormal opacity in the lower chest bilaterally which could be due to a combination of pleural fluid, volume loss and/or pneumonia. Electronically Signed   By: Nelson Chimes M.D.   On: 09/03/2021 15:41      Assessment/Plan  AKI on CKD 4 -Creatinine of 2.29 from 1.3 -Received 1 L of IV fluid bolus in the ED.  Keep on continuous gentle IV fluid hydration overnight -Avoid nephrotoxic agent  FTT/decrease oral intake -suspect components of depression and end-stage progressive of her comorbidities -pallative care has been consulted and will help with goals of care discussion. Son really just wants her to be "comfortable."  HTN Continue home antihypertensive  COPD with chronic hypoxemia - Baseline on 2 L -Continue bronchodilator  Chronic diastolic heart failure Tachy bradycardia syndrome s/p pacemaker Compensated   Persistent paroxysmal atrial fibrillation - Continue Eliquis and Coreg  Hypothyroidism Continue Synthroid  Anxiety/depression -on sertraline which could sometimes cause anorexic side effects. Recommend transitioning to mirtazapine.Son feels she already seems better with IV fluids and wants to wait on any medication  changes  Severe protein calorie  malnutrition Continue nutritional supplement  DVT prophylaxis:.Lovenox Code Status: DNR Family Communication: Plan discussed with patient and son at bedside  disposition Plan: Home with observation Consults called:  Admission status: Observation  Level of care: Med-Surg  Status is: Observation  The patient remains OBS appropriate and will d/c before 2 midnights.        Orene Desanctis DO [facility]   If 7PM-7AM, please contact night-coverage [email or web address]   09/03/2021, 7:22 PM

## 2021-09-03 NOTE — ED Provider Notes (Signed)
Emergency Medicine Provider Triage Evaluation Note  [name] , a 87 y.o. female  was evaluated in triage.  Pt who presents via EMS from [facility] rehab facility in [place] with concern for altered mental status x4 days.  Patient typically is ANO x4 at baseline but has been nonverbal for the last several days with recent completed antibiotics for pneumonia.  Chronically on 3 L by nasal cannula.  Patient unable to verbalize any response but is awake and tracks to voice. Review of Systems  Positive: Altered mental status Negative: Unable to complete ROS due to baseline ALOC  Physical Exam  BP 126/87 (BP Location: Right Arm)   Pulse 69   Temp (!) 97.4 F (36.3 C) (Oral)   Resp 15   SpO2 99%  Gen:   Awake, no distress   Resp:  Normal effort  MSK:   Moves extremities without difficulty  Other:  Nonverbal, not following commands.  Moving all 4 extremities spontaneously without difficulty.  Cries out in pain with mild palpation of the abdomen.  No palpable mass or pulsatile mass.  Medical Decision Making  Medically screening exam initiated at 2:50 PM.  Appropriate orders placed.  [name] was informed that the remainder of the evaluation will be completed by another provider, this initial triage assessment does not replace that evaluation, and the importance of remaining in the ED until their evaluation is complete.  This chart was dictated using voice recognition software, Dragon. Despite the best efforts of this provider to proofread and correct errors, errors may still occur which can change documentation meaning.    Emeline Darling, PA-C 09/03/21 1452    Daleen Bo, MD 09/03/21 1807

## 2021-09-03 NOTE — Assessment & Plan Note (Addendum)
Not actively having heart failure symptoms.  Her blood pressure does go up high, but not staying up high enough for her to have significant CHF symptoms.  I would be very leery of using a diuretic given her advanced age and tendency for dehydration.  Her hydralazine was increased to 100 mg 3 times daily-I am switching it to 150 mg twice daily for ease of use..  Thankfully, no recent hypotension issues.  I spent a long time talking with him again about this major issue.  I explained to them I am probably more worried about hypotension than hypertension.  Her pressures are running pretty high but she is 87 years old.  We need to be very careful and not be overly aggressive treating her blood pressure.  Intermittent pressures in the 190s and 200s are pretty much acceptable unless she is symptomatic.  When they are at that elevated, she can take an extra dose of amlodipine.  High doses of standing medications are difficult because of her issues with hypotension.  We discussed as needed options for high blood pressures-recheck in 1 hour, if still elevated take next dose of upcoming medication, if no dose due in the near future, take additional amlodipine 2.5 mg.

## 2021-09-03 NOTE — ED Notes (Signed)
Per Vanita Panda MD, dc rectal temp order

## 2021-09-03 NOTE — ED Notes (Signed)
Son would like updates [phone]

## 2021-09-03 NOTE — Assessment & Plan Note (Addendum)
No bleeding issues on Eliquis.  She is on 2.5 mg twice daily which is appropriate for her age and weight and renal function.

## 2021-09-03 NOTE — ED Provider Notes (Signed)
[facility] EMERGENCY DEPARTMENT Provider Note   CSN: [id number] Arrival date & time: 09/03/21  1411     History Chief Complaint  Patient presents with   Altered Mental Status    EDAN SERRATORE is a 87 y.o. female.  HPI Patient presents with her son who provides much of the history.  Level 5 caveat secondary to altered mental status. Patient does answer some questions with a very quiet voice, but speaks minimally, and beyond single word responses does not provide intelligible answers.  Son notes that the patient has been declining in general for some time, she was seen, evaluated, admitted about 1 month ago.  After spending a week in our affiliated hospital she was sent to a nursing facility.  Since that time she has had decreased oral intake, and now over the past 4 days has had overt confusion, minimal interactivity, with new nonverbal status. Previously the patient was ANO x4. She is known to have multiple medical issues, and about 1 year ago had critical illness, was evaluated by hospice, but had improvement after a course of steroids, reportedly. She is DNR, but not currently a hospice patient. No reported fall, fever, change medication.     Past Medical History:  Diagnosis Date   Allergy    Anemia    Anxiety    Aortic atherosclerosis ([place]) 07/21/2021   Arthritis    Asthma    Benign positional vertigo    Blood transfusion without reported diagnosis    Cardiac pacemaker in situ 05/01/2015   for syncope & Sx Bradycardia -> Medtronic MRI compatible pacemaker placed Dr. Caryl Comes    Cataract    Chronic diarrhea    Chronic kidney disease, stage IV (severe) (HCC)    Colon polyps    Diastolic dysfunction, left ventricle 11/03/2013   Diverticulosis    Esophageal stricture    GERD (gastroesophageal reflux disease)    Gout    Heart murmur    Hemorrhoids    Hyperlipidemia    Hypertensive heart disease    no significant RAS by MRA 2002- (<30% LRAS)     Hypothyroidism    IBS (irritable bowel syndrome)    LBBB (left bundle branch block)- new 11/01/13 11/01/2013   Paroxysmal atrial fibrillation 11/01/2013   Intermittent through the years and recurrent in December of 2014 treated with amiodarone;; Negative Myoview 10/2013   Peripheral vascular disease    82-50% LICA, 5-39% RICA by doppler 2009    Sinus arrest 04/28/2015   Syncope and collapse 04/28/2015   s/p PPM   Tubulovillous adenoma 4/07    Patient Active Problem List   Diagnosis Date Noted   Protein-calorie malnutrition, severe 08/20/2021   AKI (acute kidney injury) ([place]) 08/19/2021   Generalized weakness 08/18/2021   Hypertensive urgency 07/21/2021   Elevated troponin 07/21/2021   Aortic atherosclerosis ([place]) 07/21/2021   Chest pain 07/20/2021   Depression with anxiety 05/16/2020   Orthostatic dizziness 04/21/2019   Malnutrition of moderate degree 12/13/2017   Pressure injury of skin 12/12/2017   Hypokalemia 12/11/2017   Acute kidney injury superimposed on CKD ([place]) 12/11/2017   COPD (chronic obstructive pulmonary disease) ([place]) 12/11/2017   Hypocalcemia 12/11/2017   Hypoxia 12/05/2017   Chronic back pain 07/16/2017   Iron deficiency anemia 03/24/2017   IDA (iron deficiency anemia) 03/20/2017   Dyspnea on exertion 02/21/2017   Insomnia, persistent 07/26/2015   Cardiac pacemaker in situ    Right bundle branch block (RBBB) intermittent  04/28/2015  Closed left hip fracture (HCC)    Syncope, cardiogenic    Abnormal liver function tests 11/10/2014   Cauda equina syndrome with neurogenic bladder ([place]) 10/04/2014   CKD (chronic kidney disease), stage IV ([place]) 07/31/2014   Gout 06/17/2014   Edema of both legs 02/09/2014   Hypertensive heart and chronic kidney disease with chronic diastolic congestive heart failure ([place]) 12/03/2013   H/O tachycardia-bradycardia syndrome    Chronic anticoagulation- Eliquis 11/17/2013   LBBB (left bundle branch block)- new 11/01/13 11/01/2013    Persistent Paroxysmal atrial fibrillation: CHA2DS2-VASc Score 5, on Eliquis 11/01/2013   Peripheral vascular disease    Benign positional vertigo    Irritable bowel syndrome 03/22/2009   Anxiety state    Reactive airway disease 01/13/2008   Chronic anemia    Osteoarthritis    Hypothyroidism    Hyperlipidemia     Past Surgical History:  Procedure Laterality Date   APPENDECTOMY     CATARACT EXTRACTION     EP IMPLANTABLE DEVICE N/A 05/01/2015   Procedure: Pacemaker Implant;  Surgeon: Deboraha Sprang, MD;  Location: [place] [facility];  Service: Cardiovascular;  Laterality: N/A;   HIP ARTHROPLASTY Left 05/01/2015   Procedure: ARTHROPLASTY BIPOLAR HIP (HEMIARTHROPLASTY);  Surgeon: Rod Can, MD;  Location: [facility];  Service: Orthopedics;  Laterality: Left;   LOW ANTERIOR BOWEL RESECTION  7/08   LUMBAR [place] SURGERY  2000   NM MYOVIEW LTD  03/18/2013   Negative for ischemia or infarction. EF 55%.  LOW RISK   OOPHORECTOMY  1962   right   squamous cell skin cancer removed     TRANSTHORACIC ECHOCARDIOGRAM  10/2013, 02/2017   A) EF 55-60%, mild LVH, elevated bili pressures. Mild aortic valve calcification;; B) Normal LV size and function with mild LVH. Unable to assess diastolic function. Normal PA pressures. Mild MR,Mild AI     OB History   No obstetric history on file.     Family History  Problem Relation Age of Onset   Cancer Mother 27       unknown   Heart attack Father    Heart disease Brother    Stroke Maternal Grandmother    Lung cancer Maternal Grandfather    Stroke Paternal Grandmother    Colon cancer Daughter    Heart attack Son    Esophageal cancer Neg Hx    Rectal cancer Neg Hx    Stomach cancer Neg Hx     Social History   Tobacco Use   Smoking status: Never   Smokeless tobacco: Never   Tobacco comments:    never used tobacco  Vaping Use   Vaping Use: Never used  Substance Use Topics   Alcohol use: No    Alcohol/week: 0.0 standard drinks   Drug use:  No    Home Medications Prior to Admission medications   Medication Sig Start Date End Date Taking? Authorizing Provider  amLODipine (NORVASC) 2.5 MG tablet TAKE 1 TABLET BY MOUTH EVERY DAY Patient taking differently: Take 2.5 mg by mouth every morning. 05/16/21  Yes [name], MD  apixaban (ELIQUIS) 2.5 MG TABS tablet Take 1 tablet (2.5 mg total) by mouth 2 (two) times daily. Patient taking differently: Take 2.5 mg by mouth 2 (two) times daily with a meal. 08/07/20  Yes [name], MD  carvedilol (COREG) 6.25 MG tablet Take 1 tablet (6.25 mg total) by mouth 2 (two) times daily. 09/22/20  Yes [name], MD  hydrALAZINE (APRESOLINE) 100 MG tablet  Take 1.5 tablets (150 mg total) by mouth 2 (two) times daily. 08/07/21  Yes [name], MD  levothyroxine (SYNTHROID) 88 MCG tablet TAKE 1 TABLET DAILY BEFORE BREAKFAST Patient taking differently: Take 88 mcg by mouth daily before breakfast. 10/17/20  Yes [name], MD  Nutritional Supplements (BOOST VHC) LIQD Take 237 mLs by mouth 3 (three) times daily after meals.   Yes [provider]  NUTRITIONAL SUPPLEMENTS PO Take 120 mLs by mouth See admin instructions. [employer] Shake - drink 120 mls by mouth twice daily - with lunch and supper   Yes [provider]  OXYGEN Inhale 2 L into the lungs See admin instructions. To keep O2 saturation > or =90%   Yes [provider]  sertraline (ZOLOFT) 100 MG tablet Take 1 tablet (100 mg total) by mouth daily. 04/11/21  Yes [name], MD  azithromycin (ZITHROMAX) 250 MG tablet Take 250-500 mg by mouth See admin instructions. Patient not taking: Reported on 09/03/2021    [provider]  feeding supplement (ENSURE ENLIVE / ENSURE PLUS) LIQD Take 237 mLs by mouth 3 (three) times daily between meals. Patient not taking: Reported on 09/03/2021 08/22/21 08/22/22  Pokhrel, Corrie Mckusick, MD  ipratropium-albuterol (DUONEB) 0.5-2.5 (3) MG/3ML SOLN Take 3 mLs by  nebulization once.    [provider]  LORazepam (ATIVAN) 0.5 MG tablet Take 1 tablet (0.5 mg total) by mouth every 6 (six) hours as needed for anxiety. Patient not taking: Reported on 09/03/2021 08/22/21 08/22/22  Pokhrel, Corrie Mckusick, MD  nitroGLYCERIN (NITROSTAT) 0.4 MG SL tablet Place 1 tablet (0.4 mg total) under the tongue every 5 (five) minutes x 3 doses as needed for chest pain. Patient not taking: Reported on 09/03/2021 11/22/20   [name], MD  temazepam (RESTORIL) 15 MG capsule Take 1 capsule (15 mg total) by mouth at bedtime as needed for sleep. TAKE 2 CAPSULE BY MOUTH EVERY NIGHT AT BEDTIME AS NEEDED FOR SLEEP 08/22/21 08/22/22  Pokhrel, Corrie Mckusick, MD    Allergies    Penicillins, Xifaxan [rifaximin], Ambien [zolpidem tartrate], Amoxicillin, Ceftin [cefuroxime axetil], Codeine phosphate, Colchicine, Levofloxacin, and Lipitor [atorvastatin]  Review of Systems   Review of Systems  Unable to perform ROS: Mental status change   Physical Exam Updated Vital Signs BP (!) 155/61   Pulse (!) 56   Temp (!) 97.4 F (36.3 C) (Oral)   Resp 19   Ht 5\' 2"  (1.575 m)   Wt 41.7 kg   SpO2 99%   BMI 16.83 kg/m   Physical Exam Vitals and nursing note reviewed.  Constitutional:      Appearance: She is well-developed. She is ill-appearing.  HENT:     Head: Normocephalic and atraumatic.  Eyes:     Conjunctiva/sclera: Conjunctivae normal.  Cardiovascular:     Rate and Rhythm: Normal rate and regular rhythm.  Pulmonary:     Effort: Pulmonary effort is normal. No respiratory distress.     Breath sounds: Normal breath sounds. No stridor.  Abdominal:     General: There is no distension.  Musculoskeletal:        General: No deformity.  Skin:    General: Skin is warm and dry.     Comments: Stage I decubitus erythema  Neurological:     Cranial Nerves: No cranial nerve deficit.     Comments: Withdrawn, does answer some questions with single word responses.  Substantial  age-appropriate atrophy noted.  Psychiatric:        Cognition  and Memory: Cognition is impaired. Memory is impaired.    ED Results / Procedures / Treatments   Labs (all labs ordered are listed, but only abnormal results are displayed) Labs Reviewed  COMPREHENSIVE METABOLIC PANEL - Abnormal; Notable for the following components:      Result Value   Sodium 132 (*)    Chloride 87 (*)    CO2 34 (*)    Glucose, Bld 108 (*)    BUN 61 (*)    Creatinine, Ser 2.29 (*)    Calcium 8.4 (*)    Total Protein 6.0 (*)    Albumin 3.4 (*)    GFR, Estimated 19 (*)    All other components within normal limits  CBC WITH DIFFERENTIAL/PLATELET - Abnormal; Notable for the following components:   MCV 101.6 (*)    Platelets 130 (*)    Neutro Abs 7.9 (*)    Lymphs Abs 0.5 (*)    All other components within normal limits  PROTIME-INR - Abnormal; Notable for the following components:   Prothrombin Time 16.3 (*)    INR 1.3 (*)    All other components within normal limits  RESP PANEL BY RT-PCR (FLU A&B, COVID) [id number]  CULTURE, BLOOD (ROUTINE X 2)  CULTURE, BLOOD (ROUTINE X 2)  URINE CULTURE  AMMONIA  LIPASE, BLOOD  LACTIC ACID, PLASMA  APTT  URINALYSIS, ROUTINE W REFLEX MICROSCOPIC  RAPID URINE DRUG SCREEN, HOSP PERFORMED  BASIC METABOLIC PANEL  CBG MONITORING, ED    EKG EKG Interpretation  Date/Time:  Monday September 03 2021 16:34:42 EDT Ventricular Rate:  60 PR Interval:    QRS Duration: 170 QT Interval:  501 QTC Calculation: 501 R Axis:   -88 Text Interpretation: VENTRICULAR PACED RHYTHM Artifact Abnormal ECG Confirmed by Carmin Muskrat [phone]) on 09/03/2021 5:15:36 PM  Radiology CT HEAD WO CONTRAST  Result Date: 09/03/2021 CLINICAL DATA:  Delirium. EXAM: CT HEAD WITHOUT CONTRAST TECHNIQUE: Contiguous axial images were obtained from the base of the skull through the vertex without intravenous contrast. COMPARISON:  Head CT 08/18/2021. FINDINGS: Brain: Mild-to-moderate generalized  cerebral atrophy. Comparatively mild cerebellar atrophy. Mild patchy and ill-defined hypoattenuation within the cerebral white matter, nonspecific but compatible with chronic small vessel ischemic disease. There is no acute intracranial hemorrhage. No demarcated cortical infarct. No extra-axial fluid collection. No evidence of an intracranial mass. No midline shift. Vascular: No hyperdense vessel.  Atherosclerotic calcifications. Skull: Normal. Negative for fracture or focal lesion. Sinuses/Orbits: Visualized orbits show no acute finding. Mild mucosal thickening within the left greater than right maxillary sinuses. Other: Trace fluid within the bilateral mastoid air cells. IMPRESSION: No evidence of acute intracranial abnormality. Mild chronic small vessel ischemic changes within the cerebral white matter. Mild-to-moderate generalized cerebral atrophy. Comparatively mild cerebellar atrophy. Mild mucosal thickening within the bilateral maxillary sinuses. Trace fluid within the bilateral mastoid air cells. Electronically Signed   By: Kellie Simmering D.O.   On: 09/03/2021 17:52   DG Chest Port 1 View  Result Date: 09/03/2021 CLINICAL DATA:  Question sepsis. EXAM: PORTABLE CHEST 1 VIEW COMPARISON:  08/19/2021 FINDINGS: Chronic enlargement of the cardiac silhouette. Dual lead pacemaker as seen previously. Abnormal opacity in the lower chest on both sides which could be due to a combination of pleural fluid, volume loss and or basilar pneumonia. Upper lungs appear clear. IMPRESSION: Chronic cardiomegaly. Abnormal opacity in the lower chest bilaterally which could be due to a combination of pleural fluid, volume loss and/or pneumonia. Electronically Signed   By: [name]  Shogry M.D.   On: 09/03/2021 15:41    Procedures Procedures   Medications Ordered in ED Medications  lactated ringers infusion ( Intravenous New Bag/Given 09/03/21 1948)  apixaban (ELIQUIS) tablet 2.5 mg (has no administration in time range)   sodium chloride 0.9 % bolus 1,000 mL (0 mLs Intravenous Stopped 09/03/21 1946)    ED Course  I have reviewed the triage vital signs and the nursing notes.  Pertinent labs & imaging results that were available during my care of the patient were reviewed by me and considered in my medical decision making (see chart for details). Tach 60 paced abnormal Pulse ox 94% with nasal cannula supplemental oxygen.  Abnormal  On repeat exam patient in similar condition.  I discussed findings with patient's son.  With concern for acute kidney injury patient will continue fluid resuscitation.  Findings thus far otherwise unrevealing, head CT without notable abnormalities, chest x-ray without obvious pneumonia.  Patient has no fever, no leukocytosis low suspicion for bacteremia, sepsis.  High suspicion for the patient's listlessness, confusion secondary to acute kidney injury/delirium. The son and I discussed the patient's recent similar episode, today's findings, and is amenable to hospice consultation.  I have subsequently discussed her case with our internal medicine colleagues for admission.  MDM Rules/Calculators/A&P MDM Number of Diagnoses or Management Options AKI (acute kidney injury) ([place]): new, needed workup Delirium: new, needed workup   Amount and/or Complexity of Data Reviewed Clinical lab tests: ordered and reviewed Tests in the radiology section of CPT: ordered and reviewed Tests in the medicine section of CPT: reviewed and ordered Decide to obtain previous medical records or to obtain history from someone other than the patient: yes Obtain history from someone other than the patient: yes Review and summarize past medical records: yes Discuss the patient with other providers: yes Independent visualization of images, tracings, or specimens: yes  Risk of Complications, Morbidity, and/or Mortality Presenting problems: high Diagnostic procedures: high Management options:  high  Critical Care Total time providing critical care: < 30 minutes  Patient Progress Patient progress: stable   Final Clinical Impression(s) / ED Diagnoses Final diagnoses:  AKI (acute kidney injury) ([place])  Delirium       Carmin Muskrat, MD 09/03/21 2231

## 2021-09-03 NOTE — ED Triage Notes (Signed)
Pt arrives via EMS from [facility] with AMS since last Thursday. Pt able to nod for answers but been nonverbal. Facility denies any falls. Normal is A&Ox4. Pt at facility for failure to thrive and recently finished abx for PNA. Pt on 3L chronic.

## 2021-09-03 NOTE — Assessment & Plan Note (Signed)
Probably deconditioning.  Not really having an issue with this now despite having high blood pressures.

## 2021-09-04 DIAGNOSIS — Z789 Other specified health status: Secondary | ICD-10-CM

## 2021-09-04 DIAGNOSIS — E038 Other specified hypothyroidism: Secondary | ICD-10-CM | POA: Diagnosis not present

## 2021-09-04 DIAGNOSIS — I5032 Chronic diastolic (congestive) heart failure: Secondary | ICD-10-CM | POA: Diagnosis present

## 2021-09-04 DIAGNOSIS — R296 Repeated falls: Secondary | ICD-10-CM | POA: Diagnosis present

## 2021-09-04 DIAGNOSIS — E871 Hypo-osmolality and hyponatremia: Secondary | ICD-10-CM | POA: Diagnosis present

## 2021-09-04 DIAGNOSIS — Z66 Do not resuscitate: Secondary | ICD-10-CM

## 2021-09-04 DIAGNOSIS — J449 Chronic obstructive pulmonary disease, unspecified: Secondary | ICD-10-CM | POA: Diagnosis present

## 2021-09-04 DIAGNOSIS — I130 Hypertensive heart and chronic kidney disease with heart failure and stage 1 through stage 4 chronic kidney disease, or unspecified chronic kidney disease: Secondary | ICD-10-CM | POA: Diagnosis present

## 2021-09-04 DIAGNOSIS — Z681 Body mass index (BMI) 19 or less, adult: Secondary | ICD-10-CM | POA: Diagnosis not present

## 2021-09-04 DIAGNOSIS — E039 Hypothyroidism, unspecified: Secondary | ICD-10-CM | POA: Diagnosis present

## 2021-09-04 DIAGNOSIS — Z515 Encounter for palliative care: Secondary | ICD-10-CM | POA: Diagnosis not present

## 2021-09-04 DIAGNOSIS — Z7189 Other specified counseling: Secondary | ICD-10-CM

## 2021-09-04 DIAGNOSIS — N184 Chronic kidney disease, stage 4 (severe): Secondary | ICD-10-CM | POA: Diagnosis present

## 2021-09-04 DIAGNOSIS — R410 Disorientation, unspecified: Secondary | ICD-10-CM

## 2021-09-04 DIAGNOSIS — R627 Adult failure to thrive: Secondary | ICD-10-CM | POA: Diagnosis present

## 2021-09-04 DIAGNOSIS — N179 Acute kidney failure, unspecified: Secondary | ICD-10-CM | POA: Diagnosis present

## 2021-09-04 DIAGNOSIS — F32A Depression, unspecified: Secondary | ICD-10-CM | POA: Diagnosis present

## 2021-09-04 DIAGNOSIS — Z885 Allergy status to narcotic agent status: Secondary | ICD-10-CM | POA: Diagnosis not present

## 2021-09-04 DIAGNOSIS — E43 Unspecified severe protein-calorie malnutrition: Secondary | ICD-10-CM | POA: Diagnosis present

## 2021-09-04 DIAGNOSIS — Z20822 Contact with and (suspected) exposure to covid-19: Secondary | ICD-10-CM | POA: Diagnosis present

## 2021-09-04 DIAGNOSIS — Z880 Allergy status to penicillin: Secondary | ICD-10-CM | POA: Diagnosis not present

## 2021-09-04 DIAGNOSIS — Z8679 Personal history of other diseases of the circulatory system: Secondary | ICD-10-CM | POA: Diagnosis not present

## 2021-09-04 DIAGNOSIS — I4819 Other persistent atrial fibrillation: Secondary | ICD-10-CM | POA: Diagnosis present

## 2021-09-04 DIAGNOSIS — R638 Other symptoms and signs concerning food and fluid intake: Secondary | ICD-10-CM

## 2021-09-04 DIAGNOSIS — I495 Sick sinus syndrome: Secondary | ICD-10-CM | POA: Diagnosis present

## 2021-09-04 DIAGNOSIS — I739 Peripheral vascular disease, unspecified: Secondary | ICD-10-CM | POA: Diagnosis present

## 2021-09-04 DIAGNOSIS — Z888 Allergy status to other drugs, medicaments and biological substances status: Secondary | ICD-10-CM | POA: Diagnosis not present

## 2021-09-04 DIAGNOSIS — Z711 Person with feared health complaint in whom no diagnosis is made: Secondary | ICD-10-CM

## 2021-09-04 DIAGNOSIS — F419 Anxiety disorder, unspecified: Secondary | ICD-10-CM | POA: Diagnosis present

## 2021-09-04 DIAGNOSIS — Z7901 Long term (current) use of anticoagulants: Secondary | ICD-10-CM | POA: Diagnosis not present

## 2021-09-04 DIAGNOSIS — Z950 Presence of cardiac pacemaker: Secondary | ICD-10-CM | POA: Diagnosis not present

## 2021-09-04 LAB — URINALYSIS, ROUTINE W REFLEX MICROSCOPIC
Bilirubin Urine: NEGATIVE
Glucose, UA: NEGATIVE mg/dL
Ketones, ur: NEGATIVE mg/dL
Nitrite: NEGATIVE
Protein, ur: 100 mg/dL — AB
Specific Gravity, Urine: 1.017 (ref 1.005–1.030)
pH: 6 (ref 5.0–8.0)

## 2021-09-04 LAB — BASIC METABOLIC PANEL
Anion gap: 13 (ref 5–15)
BUN: 63 mg/dL — ABNORMAL HIGH (ref 8–23)
CO2: 27 mmol/L (ref 22–32)
Calcium: 8 mg/dL — ABNORMAL LOW (ref 8.9–10.3)
Chloride: 93 mmol/L — ABNORMAL LOW (ref 98–111)
Creatinine, Ser: 2.17 mg/dL — ABNORMAL HIGH (ref 0.44–1.00)
GFR, Estimated: 21 mL/min — ABNORMAL LOW (ref >60)
Glucose, Bld: 75 mg/dL (ref 70–99)
Potassium: 4.4 mmol/L (ref 3.5–5.1)
Sodium: 133 mmol/L — ABNORMAL LOW (ref 135–145)

## 2021-09-04 LAB — BLOOD CULTURE ID PANEL (REFLEXED) - BCID2

## 2021-09-04 LAB — RAPID URINE DRUG SCREEN, HOSP PERFORMED
Amphetamines: NOT DETECTED
Barbiturates: NOT DETECTED
Benzodiazepines: POSITIVE — AB
Cocaine: NOT DETECTED
Opiates: NOT DETECTED
Tetrahydrocannabinol: NOT DETECTED

## 2021-09-04 MED ORDER — LEVOTHYROXINE SODIUM 88 MCG PO TABS
88.0000 ug | ORAL_TABLET | Freq: Every day | ORAL | Status: DC
  Filled 2021-09-04: qty 1

## 2021-09-04 MED ORDER — HALOPERIDOL LACTATE 5 MG/ML IJ SOLN: 2.0000 mg | Freq: Four times a day (QID) | INTRAMUSCULAR | Status: DC | PRN

## 2021-09-04 MED ORDER — FENTANYL CITRATE PF 50 MCG/ML IJ SOSY: 50.0000 ug | PREFILLED_SYRINGE | INTRAMUSCULAR | Status: DC | PRN

## 2021-09-04 MED ORDER — GLYCOPYRROLATE 0.2 MG/ML IJ SOLN: 0.2000 mg | INTRAMUSCULAR | Status: AC | PRN

## 2021-09-04 MED ORDER — LORAZEPAM 1 MG PO TABS
1.0000 mg | ORAL_TABLET | ORAL | Status: DC | PRN
Start: 2021-09-04 — End: 2021-09-05

## 2021-09-04 MED ORDER — POLYVINYL ALCOHOL 1.4 % OP SOLN: 1.0000 [drp] | Freq: Four times a day (QID) | OPHTHALMIC | 0 refills | Status: AC | PRN

## 2021-09-04 MED ORDER — LORAZEPAM 2 MG/ML PO CONC: 1.0000 mg | ORAL | Status: DC | PRN

## 2021-09-04 MED ORDER — HALOPERIDOL LACTATE 2 MG/ML PO CONC: 2.0000 mg | Freq: Four times a day (QID) | ORAL | Status: DC | PRN

## 2021-09-04 MED ORDER — POLYVINYL ALCOHOL 1.4 % OP SOLN: 1.0000 [drp] | Freq: Four times a day (QID) | OPHTHALMIC | Status: DC | PRN

## 2021-09-04 MED ORDER — GLYCOPYRROLATE 1 MG PO TABS: 1.0000 mg | ORAL_TABLET | ORAL | Status: DC | PRN

## 2021-09-04 MED ORDER — HALOPERIDOL LACTATE 5 MG/ML IJ SOLN: 2.0000 mg | Freq: Four times a day (QID) | INTRAMUSCULAR | Status: AC | PRN

## 2021-09-04 MED ORDER — HALOPERIDOL 1 MG PO TABS: 1.0000 mg | ORAL_TABLET | Freq: Four times a day (QID) | ORAL | Status: DC | PRN

## 2021-09-04 MED ORDER — HYDRALAZINE HCL 50 MG PO TABS: 150.0000 mg | ORAL_TABLET | Freq: Two times a day (BID) | ORAL | Status: DC

## 2021-09-04 MED ORDER — HALOPERIDOL 1 MG PO TABS: 1.0000 mg | ORAL_TABLET | Freq: Four times a day (QID) | ORAL | Status: AC | PRN

## 2021-09-04 MED ORDER — SERTRALINE HCL 100 MG PO TABS: 100.0000 mg | ORAL_TABLET | Freq: Every day | ORAL | Status: DC

## 2021-09-04 MED ORDER — GLYCOPYRROLATE 1 MG PO TABS: 1.0000 mg | ORAL_TABLET | ORAL | Status: AC | PRN

## 2021-09-04 MED ORDER — GLYCOPYRROLATE 0.2 MG/ML IJ SOLN: 0.2000 mg | INTRAMUSCULAR | Status: DC | PRN

## 2021-09-04 MED ORDER — ONDANSETRON 4 MG PO TBDP: 4.0000 mg | ORAL_TABLET | Freq: Four times a day (QID) | ORAL | Status: DC | PRN

## 2021-09-04 MED ORDER — BIOTENE DRY MOUTH MT LIQD: 15.0000 mL | Freq: Two times a day (BID) | OROMUCOSAL | Status: DC

## 2021-09-04 MED ORDER — ACETAMINOPHEN 325 MG PO TABS: 650.0000 mg | ORAL_TABLET | Freq: Four times a day (QID) | ORAL | Status: DC | PRN

## 2021-09-04 MED ORDER — LORAZEPAM 2 MG/ML IJ SOLN: 0.5000 mg | Freq: Three times a day (TID) | INTRAMUSCULAR | Status: DC

## 2021-09-04 MED ORDER — ACETAMINOPHEN 650 MG RE SUPP: 650.0000 mg | Freq: Four times a day (QID) | RECTAL | Status: DC | PRN

## 2021-09-04 MED ORDER — SODIUM CHLORIDE 0.9 % IV SOLN: INTRAVENOUS | Status: DC

## 2021-09-04 MED ORDER — LORAZEPAM 2 MG/ML IJ SOLN: 1.0000 mg | INTRAMUSCULAR | Status: DC | PRN

## 2021-09-04 MED ORDER — ONDANSETRON 4 MG PO TBDP: 4.0000 mg | ORAL_TABLET | Freq: Four times a day (QID) | ORAL | 0 refills | Status: AC | PRN

## 2021-09-04 MED ORDER — BOOST PLUS PO LIQD
237.0000 mL | Freq: Three times a day (TID) | ORAL | Status: DC
  Filled 2021-09-04: qty 237

## 2021-09-04 MED ORDER — ONDANSETRON HCL 4 MG/2ML IJ SOLN: 4.0000 mg | Freq: Four times a day (QID) | INTRAMUSCULAR | Status: DC | PRN

## 2021-09-04 MED ORDER — AMLODIPINE BESYLATE 5 MG PO TABS: 2.5000 mg | ORAL_TABLET | Freq: Every morning | ORAL | Status: DC

## 2021-09-04 MED ORDER — HALOPERIDOL LACTATE 2 MG/ML PO CONC: 2.0000 mg | Freq: Four times a day (QID) | ORAL | 0 refills | Status: AC | PRN

## 2021-09-04 MED ORDER — CARVEDILOL 3.125 MG PO TABS: 6.2500 mg | ORAL_TABLET | Freq: Two times a day (BID) | ORAL | Status: DC

## 2021-09-04 NOTE — Progress Notes (Signed)
PHARMACY - PHYSICIAN COMMUNICATION CRITICAL VALUE ALERT - BLOOD CULTURE IDENTIFICATION (BCID)  [name] is an 87 y.o. female who presented to [facility] on 09/03/2021 with a chief complaint of failure to thrive  Assessment:   1/4 enterococcus facalis (anaerobic bottle) --no resistance patterns Patient now discharging home with hospice   Name of physician (or Provider) Contacted: Dr. Myna Hidalgo  Current antibiotics: none  Changes to prescribed antibiotics recommended:  No plans to initiate antibiotics at this time  Results for orders placed or performed during the hospital encounter of 09/03/21  Blood Culture ID Panel (Reflexed) (Collected: 09/03/2021  3:18 PM)  Result Value Ref Range   Enterococcus faecalis DETECTED (A) NOT DETECTED   Enterococcus Faecium NOT DETECTED NOT DETECTED   Listeria monocytogenes NOT DETECTED NOT DETECTED   Staphylococcus species NOT DETECTED NOT DETECTED   Staphylococcus aureus (BCID) NOT DETECTED NOT DETECTED   Staphylococcus epidermidis NOT DETECTED NOT DETECTED   Staphylococcus lugdunensis NOT DETECTED NOT DETECTED   Streptococcus species NOT DETECTED NOT DETECTED   Streptococcus agalactiae NOT DETECTED NOT DETECTED   Streptococcus pneumoniae NOT DETECTED NOT DETECTED   Streptococcus pyogenes NOT DETECTED NOT DETECTED   A.calcoaceticus-baumannii NOT DETECTED NOT DETECTED   Bacteroides fragilis NOT DETECTED NOT DETECTED   Enterobacterales NOT DETECTED NOT DETECTED   Enterobacter cloacae complex NOT DETECTED NOT DETECTED   Escherichia coli NOT DETECTED NOT DETECTED   Klebsiella aerogenes NOT DETECTED NOT DETECTED   Klebsiella oxytoca NOT DETECTED NOT DETECTED   Klebsiella pneumoniae NOT DETECTED NOT DETECTED   Proteus species NOT DETECTED NOT DETECTED   Salmonella species NOT DETECTED NOT DETECTED   Serratia marcescens NOT DETECTED NOT DETECTED   Haemophilus influenzae NOT DETECTED NOT DETECTED   Neisseria meningitidis NOT DETECTED NOT DETECTED    Pseudomonas aeruginosa NOT DETECTED NOT DETECTED   Stenotrophomonas maltophilia NOT DETECTED NOT DETECTED   Candida albicans NOT DETECTED NOT DETECTED   Candida auris NOT DETECTED NOT DETECTED   Candida glabrata NOT DETECTED NOT DETECTED   Candida krusei NOT DETECTED NOT DETECTED   Candida parapsilosis NOT DETECTED NOT DETECTED   Candida tropicalis NOT DETECTED NOT DETECTED   Cryptococcus neoformans/gattii NOT DETECTED NOT DETECTED   Vancomycin resistance NOT DETECTED NOT DETECTED    Vivi Barrack Sindi Beckworth 09/04/2021  7:36 PM

## 2021-09-04 NOTE — Discharge Summary (Signed)
Physician Discharge Summary  CYTHNIA OSMUN RXV:[id number] DOB: October 08, 1924 DOA: 09/03/2021  PCP: [name], MD  Admit date: 09/03/2021 Discharge date: 09/04/2021  Admitted From: Home Disposition: Hospice care  Discharge Condition:stable, comfort care CODE STATUS:  Code Status: DNR  Palliative diet  Brief/Interim Summary: Pt had progressive weakness with poor PO intake and frequent falls for some time. She was admitted with acute worsening resulting in AKI. During her stay, she was transitioned to comfort care due to FTT and poor prognosis of recovery.   Discharge Diagnoses:  Principal Problem:   AKI (acute kidney injury) ([place]) Active Problems:   Hypothyroidism   Persistent Paroxysmal atrial fibrillation: CHA2DS2-VASc Score 5, on Eliquis   H/O tachycardia-bradycardia syndrome   Cardiac pacemaker in situ   Acute kidney injury superimposed on CKD ([place])   Protein-calorie malnutrition, severe    Allergies as of 09/04/2021       Reactions   Penicillins Nausea And Vomiting, Swelling   Xifaxan [rifaximin] Rash   Ambien [zolpidem Tartrate] Other (See Comments)   " made her crazy "   Amoxicillin Other (See Comments)   Unknown reaction   Ceftin [cefuroxime Axetil] Diarrhea   Codeine Phosphate Other (See Comments)   Unknown reaction   Colchicine Diarrhea   Severe diarrhea   Levofloxacin Other (See Comments)   Tingle numbness   Lipitor [atorvastatin] Other (See Comments)   "makes legs jump all night"        Medication List     STOP taking these medications    amLODipine 2.5 MG tablet Commonly known as: NORVASC   apixaban 2.5 MG Tabs tablet Commonly known as: Eliquis   azithromycin 250 MG tablet Commonly known as: ZITHROMAX   Boost VHC Liqd   carvedilol 6.25 MG tablet Commonly known as: COREG   feeding supplement Liqd   hydrALAZINE 100 MG tablet Commonly known as: APRESOLINE   levothyroxine 88 MCG tablet Commonly known as: SYNTHROID   LORazepam  0.5 MG tablet Commonly known as: ATIVAN   nitroGLYCERIN 0.4 MG SL tablet Commonly known as: NITROSTAT   NUTRITIONAL SUPPLEMENTS PO   OXYGEN   sertraline 100 MG tablet Commonly known as: ZOLOFT   temazepam 15 MG capsule Commonly known as: RESTORIL       TAKE these medications    glycopyrrolate 1 MG tablet Commonly known as: ROBINUL Take 1 tablet (1 mg total) by mouth every 4 (four) hours as needed (excessive secretions).   glycopyrrolate 0.2 MG/ML injection Commonly known as: ROBINUL Inject 1 mL (0.2 mg total) into the skin every 4 (four) hours as needed (excessive secretions).   haloperidol 1 MG tablet Commonly known as: HALDOL Take 1 tablet (1 mg total) by mouth every 6 (six) hours as needed for agitation (or delirium).   haloperidol 2 MG/ML solution Commonly known as: HALDOL Place 1 mL (2 mg total) under the tongue every 6 (six) hours as needed for agitation (or delirium).   haloperidol lactate 5 MG/ML injection Commonly known as: HALDOL Inject 0.4 mLs (2 mg total) into the vein every 6 (six) hours as needed (or delirium).   ipratropium-albuterol 0.5-2.5 (3) MG/3ML Soln Commonly known as: DUONEB Take 3 mLs by nebulization once.   ondansetron 4 MG disintegrating tablet Commonly known as: ZOFRAN-ODT Take 1 tablet (4 mg total) by mouth every 6 (six) hours as needed for nausea.   polyvinyl alcohol 1.4 % ophthalmic solution Commonly known as: LIQUIFILM TEARS Place 1 drop into both eyes 4 (four) times daily as needed  for dry eyes.        Allergies  Allergen Reactions   Penicillins Nausea And Vomiting and Swelling   Xifaxan [Rifaximin] Rash   Ambien [Zolpidem Tartrate] Other (See Comments)    " made her crazy "   Amoxicillin Other (See Comments)    Unknown reaction   Ceftin [Cefuroxime Axetil] Diarrhea   Codeine Phosphate Other (See Comments)    Unknown reaction   Colchicine Diarrhea    Severe diarrhea   Levofloxacin Other (See Comments)    Tingle  numbness   Lipitor [Atorvastatin] Other (See Comments)    "makes legs jump all night"    Consultations: palliative  Procedures/Studies: DG Chest 1 View  Result Date: 08/19/2021 CLINICAL DATA:  Low O2 sats, crackles EXAM: CHEST  1 VIEW COMPARISON:  08/18/2021 FINDINGS: Cardiomegaly. Aortic calcifications. Left pacer remains in place, unchanged. Left pleural effusion with left lower lobe atelectasis or infiltrate, similar to prior study. No right effusion or focal opacity. Mild vascular congestion. IMPRESSION: Cardiomegaly, vascular congestion. Small left pleural effusion with left base atelectasis or infiltrate. Electronically Signed   By: Rolm Baptise M.D.   On: 08/19/2021 22:38   CT HEAD WO CONTRAST  Result Date: 09/03/2021 CLINICAL DATA:  Delirium. EXAM: CT HEAD WITHOUT CONTRAST TECHNIQUE: Contiguous axial images were obtained from the base of the skull through the vertex without intravenous contrast. COMPARISON:  Head CT 08/18/2021. FINDINGS: Brain: Mild-to-moderate generalized cerebral atrophy. Comparatively mild cerebellar atrophy. Mild patchy and ill-defined hypoattenuation within the cerebral white matter, nonspecific but compatible with chronic small vessel ischemic disease. There is no acute intracranial hemorrhage. No demarcated cortical infarct. No extra-axial fluid collection. No evidence of an intracranial mass. No midline shift. Vascular: No hyperdense vessel.  Atherosclerotic calcifications. Skull: Normal. Negative for fracture or focal lesion. Sinuses/Orbits: Visualized orbits show no acute finding. Mild mucosal thickening within the left greater than right maxillary sinuses. Other: Trace fluid within the bilateral mastoid air cells. IMPRESSION: No evidence of acute intracranial abnormality. Mild chronic small vessel ischemic changes within the cerebral white matter. Mild-to-moderate generalized cerebral atrophy. Comparatively mild cerebellar atrophy. Mild mucosal thickening within  the bilateral maxillary sinuses. Trace fluid within the bilateral mastoid air cells. Electronically Signed   By: Kellie Simmering D.O.   On: 09/03/2021 17:52   CT HEAD WO CONTRAST (5MM)  Result Date: 08/18/2021 CLINICAL DATA:  Mental status change, failure to thrive for 1 week EXAM: CT HEAD WITHOUT CONTRAST TECHNIQUE: Contiguous axial images were obtained from the base of the skull through the vertex without intravenous contrast. COMPARISON:  05/04/2020 FINDINGS: Brain: No evidence of acute infarction, hemorrhage, extra-axial collection, ventriculomegaly, or mass effect. Generalized cerebral atrophy. Periventricular white matter low attenuation likely secondary to microangiopathy. Vascular: Cerebrovascular atherosclerotic calcifications are noted. Skull: Negative for fracture or focal lesion. Sinuses/Orbits: Visualized portions of the orbits are unremarkable. Visualized portions of the paranasal sinuses are unremarkable. Visualized portions of the mastoid air cells are unremarkable. Other: None. IMPRESSION: 1. No acute intracranial pathology. 2. Chronic microvascular disease and cerebral atrophy. Electronically Signed   By: Kathreen Devoid M.D.   On: 08/18/2021 14:08   DG Chest Port 1 View  Result Date: 09/03/2021 CLINICAL DATA:  Question sepsis. EXAM: PORTABLE CHEST 1 VIEW COMPARISON:  08/19/2021 FINDINGS: Chronic enlargement of the cardiac silhouette. Dual lead pacemaker as seen previously. Abnormal opacity in the lower chest on both sides which could be due to a combination of pleural fluid, volume loss and or basilar pneumonia. Upper lungs appear clear.  IMPRESSION: Chronic cardiomegaly. Abnormal opacity in the lower chest bilaterally which could be due to a combination of pleural fluid, volume loss and/or pneumonia. Electronically Signed   By: Nelson Chimes M.D.   On: 09/03/2021 15:41   DG Chest Portable 1 View  Result Date: 08/18/2021 CLINICAL DATA:  Shortness of breath, hypoxemia EXAM: PORTABLE CHEST 1  VIEW COMPARISON:  07/20/2021 FINDINGS: Small left pleural effusion. No right pleural effusion. No pneumothorax. Mild bilateral interstitial thickening. Stable cardiomegaly. Dual lead cardiac pacemaker. No acute osseous abnormality. IMPRESSION: Cardiomegaly with mild pulmonary vascular congestion. Electronically Signed   By: Kathreen Devoid M.D.   On: 08/18/2021 14:06    Subjective: Patient was alert but not verbally responsive during encounter.  Discharge Exam: Vitals:   09/04/21 1230 09/04/21 1402  BP: (!) 140/57 (!) 160/63  Pulse: 64 (!) 57  Resp: 19 17  Temp:    SpO2: 96% 95%    General: Pt is alert, awake, not in acute distress Cardiovascular: extremities warm Respiratory: normal respiratory effort Extremities: no edema, no cyanosis  Labs: Basic Metabolic Panel: Recent Labs  Lab 09/03/21 1517 09/04/21 0420  NA 132* 133*  K 4.5 4.4  CL 87* 93*  CO2 34* 27  GLUCOSE 108* 75  BUN 61* 63*  CREATININE 2.29* 2.17*  CALCIUM 8.4* 8.0*   CBC: Recent Labs  Lab 09/03/21 1517  WBC 8.7  NEUTROABS 7.9*  HGB 13.3  HCT 43.8  MCV 101.6*  PLT 130*    Microbiology Recent Results (from the past 240 hour(s))  Resp Panel by RT-PCR (Flu A&B, Covid) Nasopharyngeal Swab     Status: None   Collection Time: 09/03/21  2:48 PM   Specimen: Nasopharyngeal Swab; Nasopharyngeal(NP) swabs in vial transport medium  Result Value Ref Range Status   SARS Coronavirus 2 by RT PCR NEGATIVE NEGATIVE Final    Comment: (NOTE) SARS-CoV-2 target nucleic acids are NOT DETECTED.  The SARS-CoV-2 RNA is generally detectable in upper respiratory specimens during the acute phase of infection. The lowest concentration of SARS-CoV-2 viral copies this assay can detect is 138 copies/mL. A negative result does not preclude SARS-Cov-2 infection and should not be used as the sole basis for treatment or other patient management decisions. A negative result may occur with  improper specimen collection/handling,  submission of specimen other than nasopharyngeal swab, presence of viral mutation(s) within the areas targeted by this assay, and inadequate number of viral copies(<138 copies/mL). A negative result must be combined with clinical observations, patient history, and epidemiological information. The expected result is Negative.  Fact Sheet for Patients:  EntrepreneurPulse.com.au  Fact Sheet for Healthcare Providers:  IncredibleEmployment.be  This test is no t yet approved or cleared by the [place] FDA and  has been authorized for detection and/or diagnosis of SARS-CoV-2 by FDA under an Emergency Use Authorization (EUA). This EUA will remain  in effect (meaning this test can be used) for the duration of the COVID-19 declaration under Section 564(b)(1) of the Act, 21 U.S.C.section 360bbb-3(b)(1), unless the authorization is terminated  or revoked sooner.       Influenza A by PCR NEGATIVE NEGATIVE Final   Influenza B by PCR NEGATIVE NEGATIVE Final    Comment: (NOTE) The Xpert Xpress SARS-CoV-2/FLU/RSV plus assay is intended as an aid in the diagnosis of influenza from Nasopharyngeal swab specimens and should not be used as a sole basis for treatment. Nasal washings and aspirates are unacceptable for Xpert Xpress SARS-CoV-2/FLU/RSV testing.  Fact Sheet for Patients: EntrepreneurPulse.com.au  Fact Sheet for Healthcare Providers: IncredibleEmployment.be  This test is not yet approved or cleared by the [place] FDA and has been authorized for detection and/or diagnosis of SARS-CoV-2 by FDA under an Emergency Use Authorization (EUA). This EUA will remain in effect (meaning this test can be used) for the duration of the COVID-19 declaration under Section 564(b)(1) of the Act, 21 U.S.C. section 360bbb-3(b)(1), unless the authorization is terminated or revoked.  Performed at [facility], [place] [address] [name], [place] [address]   Blood Cultures (routine x 2)     Status: None (Preliminary result)   Collection Time: 09/03/21  3:18 PM   Specimen: BLOOD  Result Value Ref Range Status   Specimen Description BLOOD SITE NOT SPECIFIED  Final   Special Requests   Final    BOTTLES DRAWN AEROBIC AND ANAEROBIC Blood Culture adequate volume   Culture   Final    NO GROWTH < 12 HOURS Performed at [facility], [place] [address], [place],  [address]    Report Status PENDING  Incomplete    Time coordinating discharge: Over 30 minutes  Richarda Osmond, MD  [facility] 09/04/2021, 3:47 PM Pager   If 7PM-7AM, please contact night-coverage [email or web address] Password [email or web address]

## 2021-09-04 NOTE — TOC Transition Note (Signed)
Transition of Care [facility]) - CM/SW Discharge Note   Patient Details  Name: DARTHY MANGANELLI MRN: [id number] Date of Birth: Jul 25, 1924  [facility] CM/SW Contact:  Tom-Johnson, Renea Ee, RN Phone Number: 09/04/2021, 4:39 PM   Clinical Narrative:    CM received a call from Jaclynn Major with [facility] about patient being discharged to their facility. States son is aware and at the facility signing paperwork. Requested to call for transportation.Discharge orders are placed by MD and copy of DNR is signed and placed on the chart. CM called [facility] and scheduled transportation and notified them of patient being on 4L Oxygen. Nurse notified to call facility and give report on patient.Medical necessity form done and placed on the chart with face sheet. No further TOC needs noted.   Final next level of care: [place] Barriers to Discharge: No Barriers Identified   Patient Goals and CMS Choice Patient states their goals for this hospitalization and ongoing recovery are:: To go to Hospice facility CMS Medicare.gov Compare Post Acute Care list provided to:: Patient Represenative (must comment) (Son) Choice offered to / list presented to : Adult Children (Son)  Discharge Placement                       Discharge Plan and Services                DME Arranged: N/A DME Agency: NA       HH Arranged: NA HH Agency: NA        Social Determinants of Health (SDOH) Interventions     Readmission Risk Interventions Readmission Risk Prevention Plan 08/22/2021  Transportation Screening Complete  PCP or Specialist Appt within 5-7 Days Complete  Home Care Screening Complete  Medication Review (RN CM) Complete  Some recent data might be hidden

## 2021-09-04 NOTE — ED Notes (Signed)
Pt has been disoriented with minimal verbal response during shifts, able to say her name but little further communication with staff. RN and NT changed pt linens and incontinence pad. Pt was incontinent of one small soft stool. Pt was on venturi mask from night shift, RN titrated to baseline 3LPM per [place], pt tolerating and sats in mid 90's. Pt unable to follow most commands; RN performed swallow screen, pt takes very small sips and has loud swallow, unable to take large consecutive drinks per swallow screen. Palliative NP rounded on pt and this RN expressed concerns re: med pass, poor swallow, etc. Palliative NP instructed to hold those AM PO meds, she will be calling son and likely altering plan of care with his input. RN and NT repositioned pt and replaced preventative bordered foam to sacrum. Pt resting comfortably in stretcher on 3LPM per [place], given warm blankets and call light in reach. IVF remains in place at this time d/t poor PO intake and awaiting change in plan of care with palliative. Will continue to monitor.

## 2021-09-04 NOTE — Progress Notes (Signed)
Patient left 67M room 16 via stretcher with transport services. Son accompanied patient and took the personal belonging with him. All belonging sent with patient's son. PIV left in place per provider order. Discharge package and DNR form sent with transport services.   Report was already called by AM nurse to Hospice at [name].

## 2021-09-04 NOTE — Consult Note (Signed)
Consultation Note Date: 09/04/2021   Patient Name: [name]  DOB: [birth year]  MRN: [id number]  Age / Sex: 87 y.o., female  PCP: [name], MD Referring Physician: Richarda Osmond, MD  Reason for Consultation: Establishing goals of care, "hospice"  HPI/Patient Profile: 87 y.o. female  with past medical history of tacky-bradycardia syndrome s/p pacemaker, LBBB, CKD stage IV, persistent atrial fibrillation on Eliquis, chronic diastolic heart failure, COPD with chronic hypoxemia on 2 L, peripheral vascular disease, history of esophageal strictures, anxiety, depression presented to [facility] ED on 09/03/21 from [facility] with family concerns for AMS x several days and FTT. Patient was admitted on 09/03/2021 with AKI on CKD IV, FTT/decreased oral intake, severe protein calorie malnutrition.   Of note, patient was recently hospitalized from 10/8-10/12 for the same issue, AKI on CKD.  ED Course: She was afebrile, normotensive and on 3 L via nasal cannula.  No leukocytosis or anemia.  Slight thrombocytopenia with platelet of 130.  Sodium of 132.  Creatinine elevated to 2.29 from 1.35 at discharge 2 weeks ago.  Ammonia level of 25. CT head was negative  Clinical Assessment and Goals of Care: I have reviewed medical records including EPIC notes, labs, and imaging. Received report from primary RN - acute concern that patient is not able to tolerate PO medications that are due this morning. RN states patient is only accepting of very minimal bites and sips.    Went to visit patient at bedside - no family/visitors present. Patient was lying in bed - she does wake to voice/gentle touch. She is able to state her name only and falls back asleep. No signs or non-verbal gestures of pain or discomfort noted. No respiratory distress, increased work of breathing, or secretions noted. Patient is ill and frail  appearing.   Met with son/Lindsey via phone  to discuss diagnosis, prognosis, GOC, EOL wishes, disposition, and options.  I introduced Palliative Medicine as specialized medical care for people living with serious illness. It focuses on providing relief from the symptoms and stress of a serious illness. The goal is to improve quality of life for both the patient and the family.  We discussed a brief life review of the patient as well as functional and nutritional status. 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 Her husband has passed away - they had 1 daughter whom has also passed and one son/Lindsey. [name] tells me that patient had a similar decline last year and enrolled her in hospice services at that time - she was given steroids, her appetite improved and was ultimately discharged. As of 6 months ago patient was doing well enough to live on her own, but has had a drastic decline in that time. Patient went to live with her son and DIL as she was forgetting to take her medications and gradually became weaker. [name] tells me that the patient has not been eating/drinking well "for a while." After patient's most recent hospitalization several weeks ago, she  was discharged to [facility] rehab, where [name] describes a continued decline, which lead to this hospitalization.   We discussed patient's current illness and what it means in the larger context of patient's on-going co-morbidities.  [name] has a clear understanding of the patient's current medical situation. He understands that CKD and COPD are progressive, non-curable disease underlying the patient's current acute medical conditions. Natural disease trajectory and expectations at EOL were discussed. I attempted to elicit values and goals of care important to the patient. The difference between aggressive medical intervention and comfort care was considered in light of the patient's goals of care. [name] tells me that  the patient had expressed she would not want her life prolonged in her current state - he wants to respect this and "keep her comfortable."   We talked about transition to comfort measures in house and what that would entail inclusive of medications to control pain, dyspnea, agitation, nausea, and itching. We discussed stopping all unnecessary measures such as blood draws, needle sticks, oxygen, antibiotics, CBGs/insulin, cardiac monitoring, IVF, and frequent vital signs. [name] was agreeable for transition to full comfort measures in house today. He tells me that patient has been anxious and requests assistance - discussed scheduling anxiety medication in addition to PRN dose - he is agreeable and appreciative.   I asked [name] if he was interested in having the patient enrolled again with hospice - reviewed that without hospice services she is at high risk for rehospitalization - he is interested but cannot remember the organization's name he has previously worked with. Per his request, provided education and counseling at length on the difference between home vs residential hospice. Prognostication was reviewed. [name] feels he would like patient evaluated for residential hospice at this time.  Discussed with son the importance of continued conversation with the medical providers regarding overall plan of care and treatment options, ensuring decisions are within the context of the patient's values and GOCs.    Questions and concerns were addressed. The patient/family was encouraged to call with questions and/or concerns. PMT number was provided.  Reached out to several local hospice agencies in attempt to find organization patient worked with last year - she was enrolled with [facility].   Primary Decision Maker: NEXT OF KIN - son/[name]    SUMMARY OF RECOMMENDATIONS   Initiated full comfort measures Continue DNR/DNI as previously documented - copy of DNR  made and will be scanned into Vynca/ACP tab Son would like patient transferred to residential hospice facility with [facility] - evaluation pending. TOC and [facility] liaison notified; TOC consulted Added orders for EOL symptom management and to reflect full comfort measures, as well as discontinued orders that were not focused on comfort Unrestricted visitation orders were placed per current [place] EOL visitation policy  Nursing to provide frequent assessments and administer PRN medications as clinically necessary to ensure EOL comfort PMT will continue to follow and support holistically   Code Status/Advance Care Planning: DNR   Palliative Prophylaxis:  Aspiration, Bowel Regimen, Delirium Protocol, Frequent Pain Assessment, Oral Care, and Turn Reposition  Additional Recommendations (Limitations, Scope, Preferences): Full Comfort Care  Psycho-social/Spiritual:  Desire for further Chaplaincy support:no Created space and opportunity for patient and family to express thoughts and feelings regarding patient's current medical situation.  Emotional support and therapeutic listening provided.  Prognosis:  < 2 weeks  Discharge Planning: Hospice facility      Primary Diagnoses: Present on Admission:  AKI (acute kidney injury) ([place])  Hypothyroidism  Persistent Paroxysmal atrial fibrillation: CHA2DS2-VASc Score 5, on Eliquis  Cardiac pacemaker in situ  Protein-calorie malnutrition, severe  Acute kidney injury superimposed on CKD ([place])   I have reviewed the medical record, interviewed the patient and family, and examined the patient. The following aspects are pertinent.  Past Medical History:  Diagnosis Date   Allergy    Anemia    Anxiety    Aortic atherosclerosis ([place]) 07/21/2021   Arthritis    Asthma    Benign positional vertigo    Blood transfusion without reported diagnosis    Cardiac pacemaker in situ 05/01/2015   for syncope & Sx Bradycardia ->  Medtronic MRI compatible pacemaker placed Dr. Caryl Comes    Cataract    Chronic diarrhea    Chronic kidney disease, stage IV (severe) ([facility])    Colon polyps    Diastolic dysfunction, left ventricle 11/03/2013   Diverticulosis    Esophageal stricture    GERD (gastroesophageal reflux disease)    Gout    Heart murmur    Hemorrhoids    Hyperlipidemia    Hypertensive heart disease    no significant RAS by MRA 2002- (<30% LRAS)    Hypothyroidism    IBS (irritable bowel syndrome)    LBBB (left bundle branch block)- new 11/01/13 11/01/2013   Paroxysmal atrial fibrillation 11/01/2013   Intermittent through the years and recurrent in December of 2014 treated with amiodarone;; Negative Myoview 10/2013   Peripheral vascular disease    80-32% LICA, 1-22% RICA by doppler 2009    Sinus arrest 04/28/2015   Syncope and collapse 04/28/2015   s/p PPM   Tubulovillous adenoma 4/07   Social History   Socioeconomic History   Marital status: Widowed    Spouse name: Not on file   Number of children: 2   Years of education: Not on file   Highest education level: Not on file  Occupational History   Occupation: retired  Tobacco Use   Smoking status: Never   Smokeless tobacco: Never   Tobacco comments:    never used tobacco  Vaping Use   Vaping Use: Never used  Substance and Sexual Activity   Alcohol use: No    Alcohol/week: 0.0 standard drinks   Drug use: No   Sexual activity: Never  Other Topics Concern   Not on file  Social History Narrative   She is a widowed mother of 2, one child is deceased. Grandmother of one. She appears to be working out routinely at [employer] using [employer]. She is otherwise quite active.   She does not drink alcohol, does not smoke.  She is retired from [employer].       She lives alone in her condo.  Currently now living with her son and daughter-in-law.  Had recently been on hospice due to failure to thrive secondary to significant back  pain.  Pain significantly improved with the steroids, and she has perked up some with notable weight gain, but still remains very tenuous.  Not likely able to go back to living alone.  Having a difficult time dealing with ramifications of her limited mobility and requirement for significant pain medications.      Very emotionally stressful.  She acknowledges that this is not how she would want to live the rest of her life.   Social Determinants of Health   Financial Resource Strain: Not on file  Food Insecurity: No Food Insecurity  Worried About Charity fundraiser in the Last Year: Never true   [place] in the Last Year: Never true  Transportation Needs: No Transportation Needs   Lack of Transportation (Medical): No   Lack of Transportation (Non-Medical): No  Physical Activity: Not on file  Stress: Not on file  Social Connections: Not on file   Family History  Problem Relation Age of Onset   Cancer Mother 30       unknown   Heart attack Father    Heart disease Brother    Stroke Maternal Grandmother    Lung cancer Maternal Grandfather    Stroke Paternal Grandmother    Colon cancer Daughter    Heart attack Son    Esophageal cancer Neg Hx    Rectal cancer Neg Hx    Stomach cancer Neg Hx    Scheduled Meds:  amLODipine  2.5 mg Oral q morning   apixaban  2.5 mg Oral BID   carvedilol  6.25 mg Oral BID WC   hydrALAZINE  150 mg Oral BID   lactose free nutrition  237 mL Oral TID PC   levothyroxine  88 mcg Oral QAC breakfast   sertraline  100 mg Oral Daily   Continuous Infusions:  sodium chloride     PRN Meds:. Medications Prior to Admission:  Prior to Admission medications   Medication Sig Start Date End Date Taking? Authorizing Provider  amLODipine (NORVASC) 2.5 MG tablet TAKE 1 TABLET BY MOUTH EVERY DAY Patient taking differently: Take 2.5 mg by mouth every morning. 05/16/21  Yes [name], MD  apixaban (ELIQUIS) 2.5 MG TABS tablet Take 1 tablet (2.5 mg total)  by mouth 2 (two) times daily. Patient taking differently: Take 2.5 mg by mouth 2 (two) times daily with a meal. 08/07/20  Yes [name], MD  carvedilol (COREG) 6.25 MG tablet Take 1 tablet (6.25 mg total) by mouth 2 (two) times daily. 09/22/20  Yes [name], MD  hydrALAZINE (APRESOLINE) 100 MG tablet Take 1.5 tablets (150 mg total) by mouth 2 (two) times daily. 08/07/21  Yes [name], MD  ipratropium-albuterol (DUONEB) 0.5-2.5 (3) MG/3ML SOLN Take 3 mLs by nebulization once.   Yes [provider]  levothyroxine (SYNTHROID) 88 MCG tablet TAKE 1 TABLET DAILY BEFORE BREAKFAST Patient taking differently: Take 88 mcg by mouth daily before breakfast. 10/17/20  Yes [name], MD  Nutritional Supplements (BOOST VHC) LIQD Take 237 mLs by mouth 3 (three) times daily after meals.   Yes [provider]  NUTRITIONAL SUPPLEMENTS PO Take 120 mLs by mouth See admin instructions. [employer] Shake - drink 120 mls by mouth twice daily - with lunch and supper   Yes [provider]  OXYGEN Inhale 2 L into the lungs See admin instructions. To keep O2 saturation > or =90%   Yes [provider]  sertraline (ZOLOFT) 100 MG tablet Take 1 tablet (100 mg total) by mouth daily. 04/11/21  Yes [name], MD  azithromycin (ZITHROMAX) 250 MG tablet Take 250-500 mg by mouth See admin instructions. Patient not taking: No sig reported    [provider]  feeding supplement (ENSURE ENLIVE / ENSURE PLUS) LIQD Take 237 mLs by mouth 3 (three) times daily between meals. Patient not taking: No sig reported 08/22/21 08/22/22  Pokhrel, Corrie Mckusick, MD  LORazepam (ATIVAN) 0.5 MG tablet Take 1 tablet (0.5 mg total) by mouth every 6 (six) hours as needed for anxiety. Patient  not taking: No sig reported 08/22/21 08/22/22  Pokhrel, Corrie Mckusick, MD  nitroGLYCERIN (NITROSTAT) 0.4 MG SL tablet Place 1 tablet (0.4 mg total) under the tongue every 5 (five) minutes x 3 doses as needed for  chest pain. Patient not taking: No sig reported 11/22/20   [name], MD  temazepam (RESTORIL) 15 MG capsule Take 1 capsule (15 mg total) by mouth at bedtime as needed for sleep. TAKE 2 CAPSULE BY MOUTH EVERY NIGHT AT BEDTIME AS NEEDED FOR SLEEP Patient not taking: Reported on 09/03/2021 08/22/21 08/22/22  Flora Lipps, MD   Allergies  Allergen Reactions   Penicillins Nausea And Vomiting and Swelling   Xifaxan [Rifaximin] Rash   Ambien [Zolpidem Tartrate] Other (See Comments)    " made her crazy "   Amoxicillin Other (See Comments)    Unknown reaction   Ceftin [Cefuroxime Axetil] Diarrhea   Codeine Phosphate Other (See Comments)    Unknown reaction   Colchicine Diarrhea    Severe diarrhea   Levofloxacin Other (See Comments)    Tingle numbness   Lipitor [Atorvastatin] Other (See Comments)    "makes legs jump all night"   Review of Systems  Unable to perform ROS: Acuity of condition   Physical Exam Vitals and nursing note reviewed.  Constitutional:      General: She is not in acute distress.    Appearance: She is cachectic. She is ill-appearing.  Pulmonary:     Effort: No respiratory distress.  Skin:    General: Skin is warm and dry.  Neurological:     Mental Status: She is lethargic, disoriented and confused.     Motor: Weakness present.  Psychiatric:        Speech: Speech is slurred.        Behavior: Behavior is slowed.        Cognition and Memory: Cognition is impaired. Memory is impaired.    Vital Signs: BP 138/65   Pulse (!) 51   Temp (!) 97.4 F (36.3 C) (Oral)   Resp 16   Ht $R'5\' 2"'CQ$  (1.575 m)   Wt 41.7 kg   SpO2 95%   BMI 16.83 kg/m  Pain Scale: 0-10   Pain Score: 0-No pain   SpO2: SpO2: 95 % O2 Device:SpO2: 95 % O2 Flow Rate: .O2 Flow Rate (L/min): 3 L/min  IO: Intake/output summary: No intake or output data in the 24 hours ending 09/04/21 1058  LBM:   Baseline Weight: Weight: 41.7 kg Most recent weight: Weight: 41.7 kg     Palliative  Assessment/Data: PPS 10%     Time In: 1130 Time Out: 1245 Time Total: 75 minutes  Greater than 50%  of this time was spent counseling and coordinating care related to the above assessment and plan.  Signed by: Lin Landsman, NP   Please contact Palliative Medicine Team phone at [phone] for questions and concerns.  For individual provider: See Shea Evans

## 2021-09-04 NOTE — Plan of Care (Signed)
  Problem: Skin Integrity: Goal: Risk for impaired skin integrity will decrease Outcome: Progressing   

## 2021-09-04 NOTE — Progress Notes (Signed)
PROGRESS NOTE  [name]    DOB: 16-Sep-1924, 87 y.o.  ERD:[id number]  PCP: [name], MD   Code Status: DNR   DOA: 09/03/2021   LOS: 0  Brief Narrative of Current Hospitalization  [name] is a 87 y.o. female with a PMH significant for tachybradycardia syndrome s/p pacemaker, LBBB, CKD stage IV, persistent A. fib on Eliquis, chronic diastolic heart failure, COPD with chronic hypoxemia on 2 L at baseline, peripheral vascular disease, H/o esophageal strictures, anxiety, depression. They presented from SNF to the ED on 09/03/2021 with failure to thrive chronically worsening. In the ED, it was found that they had slight thrombocytopenia, AKI. They were treated with gentle IV fluids.  Patient was admitted to medicine service for further workup and management of failure to thrive as outlined in detail below.  09/04/21 -stable, unimproved  Assessment & Plan  Principal Problem:   AKI (acute kidney injury) ([place]) Active Problems:   Hypothyroidism   Persistent Paroxysmal atrial fibrillation: CHA2DS2-VASc Score 5, on Eliquis   H/O tachycardia-bradycardia syndrome   Cardiac pacemaker in situ   Acute kidney injury superimposed on CKD ([place])   Protein-calorie malnutrition, severe   AKI on CKD 4  hyponatremia-creatinine improved from 2.29> 2.17, sodium improved to 132> 133.  Likely has a result of poor p.o. patient is unable to feed herself in her current state. -Continue IV hydration  Failure to thrive-gradual declining weight over the past several months with multiple health issues -Palliative following, appreciate recs  Hypothyroidism-  -Continue Synthroid  COPD with chronic hypoxemia on baseline of 2 L.  Patient was on Venturi mask this morning with normal work of breathing. -Oxygen support and breathing treatments as needed  CHF  A. Fib  HTN-chronic, stable -Continue Eliquis and Coreg  Anxiety/depression-agree with admitting physician recommendation to transition  medications to mirtazapine but will defer decision to family member/patient  Severe protein calorie malnutrition -Nutritional supplements as able to tolerate p.o.  DVT prophylaxis: apixaban (ELIQUIS) tablet 2.5 mg Start: 09/03/21 2200 apixaban (ELIQUIS) tablet 2.5 mg   Diet:  Diet Orders (From admission, onward)     Start     Ordered   09/03/21 1921  Diet regular Room service appropriate? Yes; Fluid consistency: Thin  Diet effective now       Question Answer Comment  Room service appropriate? Yes   Fluid consistency: Thin      09/03/21 1920            Subjective 09/04/21    Pt unable to report how she is feeling.  She is alert and makes eye contact with me throughout the room.  Disposition Plan & Communication  Status is: Observation  The patient will require care spanning > 2 midnights and should be moved to inpatient because: Ongoing work-up    Inpatient To Be Determined  Family Communication: We will update son via telephone as he is not at bedside Consults, Procedures, Significant Events  Consultants:  Palliative  Procedures/significant events:  None  Antimicrobials:  Anti-infectives (From admission, onward)    None        Objective   Vitals:   09/04/21 0345 09/04/21 0415 09/04/21 0515 09/04/21 0645  BP: (!) 125/52 126/68 (!) 136/56 (!) 138/52  Pulse: 60 (!) 57 (!) 59 (!) 54  Resp: 17 17 (!) 22 (!) 21  Temp:      TempSrc:      SpO2: 96% 91% 98% 98%  Weight:      Height:  No intake or output data in the 24 hours ending 09/04/21 0725 Filed Weights   09/03/21 1953  Weight: 41.7 kg    Patient BMI: Body mass index is 16.83 kg/m.   Physical Exam: General: awake, alert, NAD HEENT: atraumatic, clear conjunctiva, anicteric sclera, dry mucus membranes Respiratory: CTAB, no wheezes, rales or rhonchi, normal respiratory effort. Cardiovascular: delayed quick capillary refill  Gastrointestinal: soft, ND, no HSM felt Nervous: Alert, unable to  follow simple commands.  Moving both upper extremities. Extremities: Low muscle tone Skin: dry, intact, normal temperature, normal color, No rashes, lesions or ulcers  Labs   I have personally reviewed following labs and imaging studies Admission on 09/03/2021  Component Date Value Ref Range Status   Glucose-Capillary 09/03/2021 89  70 - 99 mg/dL Final   Sodium 09/03/2021 132 (A)  135 - 145 mmol/L Final   Potassium 09/03/2021 4.5  3.5 - 5.1 mmol/L Final   Chloride 09/03/2021 87 (A)  98 - 111 mmol/L Final   CO2 09/03/2021 34 (A)  22 - 32 mmol/L Final   Glucose, Bld 09/03/2021 108 (A)  70 - 99 mg/dL Final   BUN 09/03/2021 61 (A)  8 - 23 mg/dL Final   Creatinine, Ser 09/03/2021 2.29 (A)  0.44 - 1.00 mg/dL Final   Calcium 09/03/2021 8.4 (A)  8.9 - 10.3 mg/dL Final   Total Protein 09/03/2021 6.0 (A)  6.5 - 8.1 g/dL Final   Albumin 09/03/2021 3.4 (A)  3.5 - 5.0 g/dL Final   AST 09/03/2021 19  15 - 41 U/L Final   ALT 09/03/2021 14  0 - 44 U/L Final   Alkaline Phosphatase 09/03/2021 46  38 - 126 U/L Final   Total Bilirubin 09/03/2021 0.8  0.3 - 1.2 mg/dL Final   GFR, Estimated 09/03/2021 19 (A)  >60 mL/min Final   Anion gap 09/03/2021 11  5 - 15 Final   WBC 09/03/2021 8.7  4.0 - 10.5 K/uL Final   RBC 09/03/2021 4.31  3.87 - 5.11 MIL/uL Final   Hemoglobin 09/03/2021 13.3  12.0 - 15.0 g/dL Final   HCT 09/03/2021 43.8  36.0 - 46.0 % Final   MCV 09/03/2021 101.6 (A)  80.0 - 100.0 fL Final   MCH 09/03/2021 30.9  26.0 - 34.0 pg Final   MCHC 09/03/2021 30.4  30.0 - 36.0 g/dL Final   RDW 09/03/2021 14.9  11.5 - 15.5 % Final   Platelets 09/03/2021 130 (A)  150 - 400 K/uL Final   nRBC 09/03/2021 0.0  0.0 - 0.2 % Final   Neutrophils Relative % 09/03/2021 89  % Final   Neutro Abs 09/03/2021 7.9 (A)  1.7 - 7.7 K/uL Final   Lymphocytes Relative 09/03/2021 6  % Final   Lymphs Abs 09/03/2021 0.5 (A)  0.7 - 4.0 K/uL Final   Monocytes Relative 09/03/2021 4  % Final   Monocytes Absolute 09/03/2021 0.4   0.1 - 1.0 K/uL Final   Eosinophils Relative 09/03/2021 0  % Final   Eosinophils Absolute 09/03/2021 0.0  0.0 - 0.5 K/uL Final   Basophils Relative 09/03/2021 0  % Final   Basophils Absolute 09/03/2021 0.0  0.0 - 0.1 K/uL Final   Immature Granulocytes 09/03/2021 1  % Final   Abs Immature Granulocytes 09/03/2021 0.04  0.00 - 0.07 K/uL Final   Color, Urine 09/04/2021 AMBER (A)  YELLOW Final   APPearance 09/04/2021 CLOUDY (A)  CLEAR Final   Specific Gravity, Urine 09/04/2021 1.017  1.005 - 1.030 Final  pH 09/04/2021 6.0  5.0 - 8.0 Final   Glucose, UA 09/04/2021 NEGATIVE  NEGATIVE mg/dL Final   Hgb urine dipstick 09/04/2021 MODERATE (A)  NEGATIVE Final   Bilirubin Urine 09/04/2021 NEGATIVE  NEGATIVE Final   Ketones, ur 09/04/2021 NEGATIVE  NEGATIVE mg/dL Final   Protein, ur 09/04/2021 100 (A)  NEGATIVE mg/dL Final   Nitrite 09/04/2021 NEGATIVE  NEGATIVE Final   Leukocytes,Ua 09/04/2021 LARGE (A)  NEGATIVE Final   RBC / HPF 09/04/2021 21-50  0 - 5 RBC/hpf Final   WBC, UA 09/04/2021 21-50  0 - 5 WBC/hpf Final   Bacteria, UA 09/04/2021 MANY (A)  NONE SEEN Final   Squamous Epithelial / LPF 09/04/2021 0-5  0 - 5 Final   Mucus 09/04/2021 PRESENT   Final   Non Squamous Epithelial 09/04/2021 0-5 (A)  NONE SEEN Final   Ammonia 09/03/2021 25  9 - 35 umol/L Final   Opiates 09/04/2021 NONE DETECTED  NONE DETECTED Final   Cocaine 09/04/2021 NONE DETECTED  NONE DETECTED Final   Benzodiazepines 09/04/2021 POSITIVE (A)  NONE DETECTED Final   Amphetamines 09/04/2021 NONE DETECTED  NONE DETECTED Final   Tetrahydrocannabinol 09/04/2021 NONE DETECTED  NONE DETECTED Final   Barbiturates 09/04/2021 NONE DETECTED  NONE DETECTED Final   Lipase 09/03/2021 39  11 - 51 U/L Final   Lactic Acid, Venous 09/03/2021 1.4  0.5 - 1.9 mmol/L Final   Prothrombin Time 09/03/2021 16.3 (A)  11.4 - 15.2 seconds Final   INR 09/03/2021 1.3 (A)  0.8 - 1.2 Final   aPTT 09/03/2021 33  24 - 36 seconds Final   SARS Coronavirus 2 by  RT PCR 09/03/2021 NEGATIVE  NEGATIVE Final   Influenza A by PCR 09/03/2021 NEGATIVE  NEGATIVE Final   Influenza B by PCR 09/03/2021 NEGATIVE  NEGATIVE Final   Sodium 09/04/2021 133 (A)  135 - 145 mmol/L Final   Potassium 09/04/2021 4.4  3.5 - 5.1 mmol/L Final   Chloride 09/04/2021 93 (A)  98 - 111 mmol/L Final   CO2 09/04/2021 27  22 - 32 mmol/L Final   Glucose, Bld 09/04/2021 75  70 - 99 mg/dL Final   BUN 09/04/2021 63 (A)  8 - 23 mg/dL Final   Creatinine, Ser 09/04/2021 2.17 (A)  0.44 - 1.00 mg/dL Final   Calcium 09/04/2021 8.0 (A)  8.9 - 10.3 mg/dL Final   GFR, Estimated 09/04/2021 21 (A)  >60 mL/min Final   Anion gap 09/04/2021 13  5 - 15 Final    Imaging Studies  CT HEAD WO CONTRAST  Result Date: 09/03/2021 CLINICAL DATA:  Delirium. EXAM: CT HEAD WITHOUT CONTRAST TECHNIQUE: Contiguous axial images were obtained from the base of the skull through the vertex without intravenous contrast. COMPARISON:  Head CT 08/18/2021. FINDINGS: Brain: Mild-to-moderate generalized cerebral atrophy. Comparatively mild cerebellar atrophy. Mild patchy and ill-defined hypoattenuation within the cerebral white matter, nonspecific but compatible with chronic small vessel ischemic disease. There is no acute intracranial hemorrhage. No demarcated cortical infarct. No extra-axial fluid collection. No evidence of an intracranial mass. No midline shift. Vascular: No hyperdense vessel.  Atherosclerotic calcifications. Skull: Normal. Negative for fracture or focal lesion. Sinuses/Orbits: Visualized orbits show no acute finding. Mild mucosal thickening within the left greater than right maxillary sinuses. Other: Trace fluid within the bilateral mastoid air cells. IMPRESSION: No evidence of acute intracranial abnormality. Mild chronic small vessel ischemic changes within the cerebral white matter. Mild-to-moderate generalized cerebral atrophy. Comparatively mild cerebellar atrophy. Mild mucosal thickening within the bilateral  maxillary sinuses. Trace fluid within the bilateral mastoid air cells. Electronically Signed   By: Kellie Simmering D.O.   On: 09/03/2021 17:52   DG Chest Port 1 View  Result Date: 09/03/2021 CLINICAL DATA:  Question sepsis. EXAM: PORTABLE CHEST 1 VIEW COMPARISON:  08/19/2021 FINDINGS: Chronic enlargement of the cardiac silhouette. Dual lead pacemaker as seen previously. Abnormal opacity in the lower chest on both sides which could be due to a combination of pleural fluid, volume loss and or basilar pneumonia. Upper lungs appear clear. IMPRESSION: Chronic cardiomegaly. Abnormal opacity in the lower chest bilaterally which could be due to a combination of pleural fluid, volume loss and/or pneumonia. Electronically Signed   By: Nelson Chimes M.D.   On: 09/03/2021 15:41   Medications   Scheduled Meds:  amLODipine  2.5 mg Oral q morning   apixaban  2.5 mg Oral BID   carvedilol  6.25 mg Oral BID WC   hydrALAZINE  150 mg Oral BID   lactose free nutrition  237 mL Oral TID PC   levothyroxine  88 mcg Oral QAC breakfast   sertraline  100 mg Oral Daily   No recently discontinued medications to reconcile  LOS: 0 days   Time spent: >7min  Maleena Eddleman L Jamare Vanatta, DO [facility] 09/04/2021, 7:25 AM   Please refer to amion to contact the [facility] Attending or Consulting provider for this pt

## 2021-09-04 NOTE — Progress Notes (Signed)
New Admission Note:   Arrival Method: stretcher  Mental Orientation: lethargic and sleepy  Telemetry: none  Assessment: Completed Skin: red, none blanchable sacrum IV: left forearm  Pain: sleeping  Tubes: nasal canula  Safety Measures: Safety Fall Prevention Plan has been given, discussed and signed Admission: Completed [facility] Orientation: Patient has been orientated to the room, unit and staff.  Family: none   Orders have been reviewed and implemented. Will continue to monitor the patient. Call light has been placed within reach and bed alarm has been activated.   Kele Barthelemy RN [facility] Phone: [phone]

## 2021-09-04 NOTE — Progress Notes (Deleted)
DISCHARGE NOTE SN[name] to be discharged  [facility]  per MD order. Patient verbalized understanding.  Skin clean, dry and intact without evidence of skin break down, no evidence of skin tears noted. IV catheter discontinued intact. Site without signs and symptoms of complications. Dressing and pressure applied. Pt denies pain at the site currently. No complaints noted.  Patient retain IV access.   Discharge packet assembled. An After Visit Summary (AVS) was printed and given to the EMS personnel. Patient escorted via stretcher and discharged to [facility] via ambulance. Report called to accepting facility; all questions and concerns addressed.   Arlyss Repress, RN

## 2021-09-05 ENCOUNTER — Ambulatory Visit: Payer: Self-pay

## 2021-09-05 LAB — URINE CULTURE: Culture: >=100000 — AB

## 2021-09-06 LAB — CULTURE, BLOOD (ROUTINE X 2): Special Requests: ADEQUATE

## 2021-09-07 ENCOUNTER — Telehealth: Payer: Self-pay | Admitting: Family Medicine

## 2021-09-07 NOTE — Telephone Encounter (Signed)
FYI

## 2021-09-07 NOTE — Telephone Encounter (Signed)
Pt daughter in law Butch Penny is calling to report the patient has passed away on 2021/10/01

## 2021-09-11 DEATH — deceased

## 2022-02-04 ENCOUNTER — Ambulatory Visit: Payer: Medicare PPO | Admitting: Cardiology
